# Patient Record
Sex: Male | Born: 1950
Health system: Southern US, Community
[De-identification: ages and names within clinical notes are randomized; demographics above are authoritative.]

## PROBLEM LIST (undated history)

## (undated) DIAGNOSIS — M1990 Unspecified osteoarthritis, unspecified site: Secondary | ICD-10-CM

## (undated) DIAGNOSIS — Z5189 Encounter for other specified aftercare: Secondary | ICD-10-CM

## (undated) DIAGNOSIS — R972 Elevated prostate specific antigen [PSA]: Secondary | ICD-10-CM

## (undated) DIAGNOSIS — N390 Urinary tract infection, site not specified: Secondary | ICD-10-CM

## (undated) DIAGNOSIS — K439 Ventral hernia without obstruction or gangrene: Secondary | ICD-10-CM

## (undated) DIAGNOSIS — Q670 Congenital facial asymmetry: Secondary | ICD-10-CM

## (undated) DIAGNOSIS — E213 Hyperparathyroidism, unspecified: Secondary | ICD-10-CM

## (undated) DIAGNOSIS — R140 Abdominal distension (gaseous): Secondary | ICD-10-CM

## (undated) DIAGNOSIS — Z9889 Other specified postprocedural states: Secondary | ICD-10-CM

## (undated) DIAGNOSIS — D510 Vitamin B12 deficiency anemia due to intrinsic factor deficiency: Secondary | ICD-10-CM

## (undated) DIAGNOSIS — F329 Major depressive disorder, single episode, unspecified: Secondary | ICD-10-CM

## (undated) DIAGNOSIS — N200 Calculus of kidney: Secondary | ICD-10-CM

## (undated) DIAGNOSIS — K5730 Diverticulosis of large intestine without perforation or abscess without bleeding: Secondary | ICD-10-CM

## (undated) DIAGNOSIS — I10 Essential (primary) hypertension: Secondary | ICD-10-CM

## (undated) DIAGNOSIS — F32A Depression, unspecified: Secondary | ICD-10-CM

## (undated) DIAGNOSIS — J189 Pneumonia, unspecified organism: Secondary | ICD-10-CM

## (undated) DIAGNOSIS — K219 Gastro-esophageal reflux disease without esophagitis: Secondary | ICD-10-CM

## (undated) DIAGNOSIS — K602 Anal fissure, unspecified: Secondary | ICD-10-CM

## (undated) DIAGNOSIS — G500 Trigeminal neuralgia: Secondary | ICD-10-CM

## (undated) DIAGNOSIS — K659 Peritonitis, unspecified: Secondary | ICD-10-CM

## (undated) DIAGNOSIS — K635 Polyp of colon: Secondary | ICD-10-CM

## (undated) DIAGNOSIS — R112 Nausea with vomiting, unspecified: Secondary | ICD-10-CM

## (undated) DIAGNOSIS — R066 Hiccough: Secondary | ICD-10-CM

## (undated) DIAGNOSIS — K589 Irritable bowel syndrome without diarrhea: Secondary | ICD-10-CM

## (undated) DIAGNOSIS — F419 Anxiety disorder, unspecified: Secondary | ICD-10-CM

## (undated) DIAGNOSIS — Z87898 Personal history of other specified conditions: Secondary | ICD-10-CM

## (undated) DIAGNOSIS — J449 Chronic obstructive pulmonary disease, unspecified: Secondary | ICD-10-CM

## (undated) DIAGNOSIS — E785 Hyperlipidemia, unspecified: Secondary | ICD-10-CM

## (undated) DIAGNOSIS — T83511A Infection and inflammatory reaction due to indwelling urethral catheter, initial encounter: Secondary | ICD-10-CM

## (undated) HISTORY — DX: Polyp of colon: K63.5

## (undated) HISTORY — DX: Peritonitis, unspecified: K65.9

## (undated) HISTORY — DX: Essential (primary) hypertension: I10

## (undated) HISTORY — DX: Encounter for other specified aftercare: Z51.89

## (undated) HISTORY — DX: Hyperlipidemia, unspecified: E78.5

## (undated) HISTORY — DX: Anal fissure, unspecified: K60.2

## (undated) HISTORY — DX: Elevated prostate specific antigen (PSA): R97.20

## (undated) HISTORY — DX: Depression, unspecified: F32.A

## (undated) HISTORY — DX: Gastro-esophageal reflux disease without esophagitis: K21.9

## (undated) HISTORY — DX: Personal history of other specified conditions: Z87.898

## (undated) HISTORY — DX: Pneumonia, unspecified organism: J18.9

## (undated) HISTORY — DX: Vitamin B12 deficiency anemia due to intrinsic factor deficiency: D51.0

## (undated) HISTORY — DX: Unspecified osteoarthritis, unspecified site: M19.90

## (undated) HISTORY — DX: Abdominal distension (gaseous): R14.0

## (undated) HISTORY — DX: Irritable bowel syndrome, unspecified: K58.9

## (undated) HISTORY — DX: Diverticulosis of large intestine without perforation or abscess without bleeding: K57.30

## (undated) HISTORY — DX: Chronic obstructive pulmonary disease, unspecified: J44.9

## (undated) HISTORY — DX: Anxiety disorder, unspecified: F41.9

## (undated) HISTORY — DX: Calculus of kidney: N20.0

## (undated) HISTORY — PX: ABDOMINAL SURGERY: SHX537

## (undated) HISTORY — DX: Hiccough: R06.6

## (undated) HISTORY — DX: Congenital facial asymmetry: Q67.0

## (undated) HISTORY — DX: Hyperparathyroidism, unspecified: E21.3

## (undated) HISTORY — DX: Trigeminal neuralgia: G50.0

## (undated) HISTORY — PX: UPPER GASTROINTESTINAL ENDOSCOPY: SHX188

## (undated) HISTORY — DX: Major depressive disorder, single episode, unspecified: F32.9

## (undated) HISTORY — PX: LITHOTRIPSY: SUR834

---

## 1971-11-07 HISTORY — PX: HAND SURGERY: SHX662

## 1979-11-07 HISTORY — PX: CHOLECYSTECTOMY: SHX55

## 1995-11-07 HISTORY — PX: KNEE SURGERY: SHX244

## 1997-11-06 DIAGNOSIS — Z87898 Personal history of other specified conditions: Secondary | ICD-10-CM

## 1997-11-06 HISTORY — PX: NISSEN FUNDOPLICATION: SHX2091

## 1997-11-06 HISTORY — DX: Personal history of other specified conditions: Z87.898

## 1997-11-06 HISTORY — PX: HERNIA REPAIR: SHX51

## 1998-03-25 ENCOUNTER — Observation Stay (HOSPITAL_COMMUNITY): Admission: EM | Admit: 1998-03-25 | Discharge: 1998-03-25 | Payer: Self-pay | Admitting: Emergency Medicine

## 1998-07-29 ENCOUNTER — Inpatient Hospital Stay (HOSPITAL_COMMUNITY): Admission: AD | Admit: 1998-07-29 | Discharge: 1998-09-28 | Payer: Self-pay | Admitting: Pulmonary Disease

## 1998-07-30 ENCOUNTER — Encounter: Payer: Self-pay | Admitting: Pulmonary Disease

## 1998-08-01 ENCOUNTER — Encounter: Payer: Self-pay | Admitting: Pulmonary Disease

## 1998-08-09 ENCOUNTER — Encounter: Payer: Self-pay | Admitting: Critical Care Medicine

## 1998-08-09 ENCOUNTER — Encounter: Payer: Self-pay | Admitting: Surgery

## 1998-08-11 ENCOUNTER — Encounter: Payer: Self-pay | Admitting: Pulmonary Disease

## 1998-08-16 ENCOUNTER — Encounter: Payer: Self-pay | Admitting: General Surgery

## 1998-08-17 ENCOUNTER — Encounter: Payer: Self-pay | Admitting: Pulmonary Disease

## 1998-08-18 ENCOUNTER — Encounter: Payer: Self-pay | Admitting: Internal Medicine

## 1998-08-20 ENCOUNTER — Encounter: Payer: Self-pay | Admitting: Pulmonary Disease

## 1998-08-23 ENCOUNTER — Encounter: Payer: Self-pay | Admitting: Pulmonary Disease

## 1998-08-24 ENCOUNTER — Encounter: Payer: Self-pay | Admitting: Internal Medicine

## 1998-08-24 ENCOUNTER — Encounter: Payer: Self-pay | Admitting: Critical Care Medicine

## 1998-08-25 ENCOUNTER — Encounter: Payer: Self-pay | Admitting: Pulmonary Disease

## 1998-08-26 ENCOUNTER — Encounter: Payer: Self-pay | Admitting: Critical Care Medicine

## 1998-08-27 ENCOUNTER — Encounter: Payer: Self-pay | Admitting: Surgery

## 1998-08-30 ENCOUNTER — Encounter: Payer: Self-pay | Admitting: Pulmonary Disease

## 1998-08-30 ENCOUNTER — Encounter: Payer: Self-pay | Admitting: Critical Care Medicine

## 1998-09-02 ENCOUNTER — Encounter: Payer: Self-pay | Admitting: Internal Medicine

## 1998-09-04 ENCOUNTER — Encounter: Payer: Self-pay | Admitting: Critical Care Medicine

## 1998-09-05 ENCOUNTER — Encounter: Payer: Self-pay | Admitting: Pulmonary Disease

## 1998-09-09 ENCOUNTER — Encounter: Payer: Self-pay | Admitting: Infectious Diseases

## 1998-09-15 ENCOUNTER — Encounter: Payer: Self-pay | Admitting: Critical Care Medicine

## 1998-09-15 ENCOUNTER — Encounter: Payer: Self-pay | Admitting: Pulmonary Disease

## 1998-09-17 ENCOUNTER — Encounter: Payer: Self-pay | Admitting: Pulmonary Disease

## 1998-09-17 ENCOUNTER — Encounter: Payer: Self-pay | Admitting: Critical Care Medicine

## 1998-09-19 ENCOUNTER — Encounter: Payer: Self-pay | Admitting: Pulmonary Disease

## 1998-09-21 ENCOUNTER — Encounter: Payer: Self-pay | Admitting: Pulmonary Disease

## 1998-09-28 ENCOUNTER — Inpatient Hospital Stay (HOSPITAL_COMMUNITY)
Admission: RE | Admit: 1998-09-28 | Discharge: 1998-10-06 | Payer: Self-pay | Admitting: Physical Medicine and Rehabilitation

## 1998-10-01 ENCOUNTER — Encounter: Payer: Self-pay | Admitting: Physical Medicine and Rehabilitation

## 1998-10-11 ENCOUNTER — Encounter
Admission: RE | Admit: 1998-10-11 | Discharge: 1999-01-09 | Payer: Self-pay | Admitting: Physical Medicine and Rehabilitation

## 1999-03-17 ENCOUNTER — Encounter: Payer: Self-pay | Admitting: Internal Medicine

## 1999-06-28 ENCOUNTER — Encounter: Payer: Self-pay | Admitting: Surgery

## 1999-07-01 ENCOUNTER — Inpatient Hospital Stay (HOSPITAL_COMMUNITY): Admission: AD | Admit: 1999-07-01 | Discharge: 1999-07-09 | Payer: Self-pay | Admitting: Surgery

## 1999-07-04 ENCOUNTER — Encounter: Payer: Self-pay | Admitting: Surgery

## 1999-07-06 ENCOUNTER — Encounter: Payer: Self-pay | Admitting: Surgery

## 1999-07-14 ENCOUNTER — Encounter: Payer: Self-pay | Admitting: Surgery

## 1999-07-14 ENCOUNTER — Inpatient Hospital Stay (HOSPITAL_COMMUNITY): Admission: EM | Admit: 1999-07-14 | Discharge: 1999-07-26 | Payer: Self-pay | Admitting: Emergency Medicine

## 1999-07-21 ENCOUNTER — Encounter: Payer: Self-pay | Admitting: Surgery

## 1999-08-11 ENCOUNTER — Inpatient Hospital Stay (HOSPITAL_COMMUNITY): Admission: RE | Admit: 1999-08-11 | Discharge: 1999-08-24 | Payer: Self-pay | Admitting: Surgery

## 1999-08-11 ENCOUNTER — Encounter: Payer: Self-pay | Admitting: Surgery

## 1999-08-12 ENCOUNTER — Encounter: Payer: Self-pay | Admitting: Surgery

## 1999-08-20 ENCOUNTER — Encounter: Payer: Self-pay | Admitting: Surgery

## 1999-09-01 ENCOUNTER — Encounter: Payer: Self-pay | Admitting: General Surgery

## 1999-09-01 ENCOUNTER — Ambulatory Visit (HOSPITAL_COMMUNITY): Admission: RE | Admit: 1999-09-01 | Discharge: 1999-09-01 | Payer: Self-pay | Admitting: General Surgery

## 1999-10-13 ENCOUNTER — Encounter: Payer: Self-pay | Admitting: Surgery

## 1999-10-13 ENCOUNTER — Ambulatory Visit (HOSPITAL_COMMUNITY): Admission: RE | Admit: 1999-10-13 | Discharge: 1999-10-13 | Payer: Self-pay | Admitting: Surgery

## 2000-06-11 ENCOUNTER — Encounter: Payer: Self-pay | Admitting: Internal Medicine

## 2000-06-11 ENCOUNTER — Inpatient Hospital Stay: Admission: RE | Admit: 2000-06-11 | Discharge: 2000-06-13 | Payer: Self-pay | Admitting: Internal Medicine

## 2000-06-11 ENCOUNTER — Encounter (INDEPENDENT_AMBULATORY_CARE_PROVIDER_SITE_OTHER): Payer: Self-pay | Admitting: *Deleted

## 2000-06-12 ENCOUNTER — Encounter: Payer: Self-pay | Admitting: Emergency Medicine

## 2000-07-27 ENCOUNTER — Encounter: Payer: Self-pay | Admitting: Surgery

## 2000-07-30 ENCOUNTER — Inpatient Hospital Stay (HOSPITAL_COMMUNITY): Admission: RE | Admit: 2000-07-30 | Discharge: 2000-08-04 | Payer: Self-pay | Admitting: Surgery

## 2000-07-30 ENCOUNTER — Encounter (INDEPENDENT_AMBULATORY_CARE_PROVIDER_SITE_OTHER): Payer: Self-pay | Admitting: *Deleted

## 2001-06-05 ENCOUNTER — Ambulatory Visit: Admission: RE | Admit: 2001-06-05 | Discharge: 2001-06-05 | Payer: Self-pay | Admitting: Pulmonary Disease

## 2002-04-15 ENCOUNTER — Encounter: Payer: Self-pay | Admitting: Surgery

## 2002-04-15 ENCOUNTER — Ambulatory Visit (HOSPITAL_COMMUNITY): Admission: RE | Admit: 2002-04-15 | Discharge: 2002-04-15 | Payer: Self-pay | Admitting: Surgery

## 2002-10-17 ENCOUNTER — Encounter: Payer: Self-pay | Admitting: Internal Medicine

## 2002-10-17 ENCOUNTER — Ambulatory Visit (HOSPITAL_COMMUNITY): Admission: RE | Admit: 2002-10-17 | Discharge: 2002-10-17 | Payer: Self-pay | Admitting: Internal Medicine

## 2002-11-06 HISTORY — PX: VENTRAL HERNIA REPAIR: SHX424

## 2003-05-29 ENCOUNTER — Encounter: Admission: RE | Admit: 2003-05-29 | Discharge: 2003-05-29 | Payer: Self-pay | Admitting: Surgery

## 2003-05-29 ENCOUNTER — Encounter: Payer: Self-pay | Admitting: Surgery

## 2003-06-03 ENCOUNTER — Encounter: Payer: Self-pay | Admitting: Surgery

## 2003-06-03 ENCOUNTER — Ambulatory Visit (HOSPITAL_COMMUNITY): Admission: RE | Admit: 2003-06-03 | Discharge: 2003-06-03 | Payer: Self-pay | Admitting: Surgery

## 2003-06-22 ENCOUNTER — Encounter: Payer: Self-pay | Admitting: Surgery

## 2003-06-22 ENCOUNTER — Ambulatory Visit (HOSPITAL_COMMUNITY): Admission: RE | Admit: 2003-06-22 | Discharge: 2003-06-22 | Payer: Self-pay | Admitting: Surgery

## 2003-11-09 ENCOUNTER — Ambulatory Visit (HOSPITAL_COMMUNITY): Admission: RE | Admit: 2003-11-09 | Discharge: 2003-11-09 | Payer: Self-pay | Admitting: Urology

## 2004-09-15 ENCOUNTER — Ambulatory Visit: Payer: Self-pay | Admitting: Endocrinology

## 2004-09-26 ENCOUNTER — Ambulatory Visit: Payer: Self-pay | Admitting: Internal Medicine

## 2004-09-26 ENCOUNTER — Ambulatory Visit: Payer: Self-pay | Admitting: Gastroenterology

## 2004-09-26 ENCOUNTER — Inpatient Hospital Stay (HOSPITAL_COMMUNITY): Admission: AD | Admit: 2004-09-26 | Discharge: 2004-09-28 | Payer: Self-pay | Admitting: Internal Medicine

## 2004-09-28 ENCOUNTER — Encounter: Payer: Self-pay | Admitting: Gastroenterology

## 2004-09-28 ENCOUNTER — Encounter (INDEPENDENT_AMBULATORY_CARE_PROVIDER_SITE_OTHER): Payer: Self-pay | Admitting: *Deleted

## 2004-09-30 ENCOUNTER — Ambulatory Visit: Payer: Self-pay | Admitting: Endocrinology

## 2005-01-26 ENCOUNTER — Ambulatory Visit: Payer: Self-pay | Admitting: Internal Medicine

## 2005-03-14 ENCOUNTER — Ambulatory Visit: Payer: Self-pay | Admitting: Endocrinology

## 2005-05-03 ENCOUNTER — Ambulatory Visit: Payer: Self-pay | Admitting: Endocrinology

## 2005-05-10 ENCOUNTER — Ambulatory Visit: Payer: Self-pay | Admitting: Endocrinology

## 2005-05-10 ENCOUNTER — Ambulatory Visit (HOSPITAL_COMMUNITY): Admission: RE | Admit: 2005-05-10 | Discharge: 2005-05-10 | Payer: Self-pay | Admitting: Endocrinology

## 2005-05-10 ENCOUNTER — Ambulatory Visit: Payer: Self-pay

## 2005-05-22 ENCOUNTER — Ambulatory Visit: Payer: Self-pay | Admitting: Endocrinology

## 2005-06-07 ENCOUNTER — Ambulatory Visit: Payer: Self-pay | Admitting: Endocrinology

## 2005-07-28 ENCOUNTER — Ambulatory Visit: Payer: Self-pay | Admitting: Endocrinology

## 2005-09-06 ENCOUNTER — Ambulatory Visit: Payer: Self-pay | Admitting: Endocrinology

## 2006-03-07 ENCOUNTER — Encounter: Payer: Self-pay | Admitting: Surgery

## 2006-07-12 ENCOUNTER — Ambulatory Visit: Payer: Self-pay | Admitting: Endocrinology

## 2006-07-25 ENCOUNTER — Ambulatory Visit (HOSPITAL_COMMUNITY): Admission: RE | Admit: 2006-07-25 | Discharge: 2006-07-25 | Payer: Self-pay | Admitting: Urology

## 2006-07-26 ENCOUNTER — Ambulatory Visit: Payer: Self-pay | Admitting: Gastroenterology

## 2006-07-31 ENCOUNTER — Ambulatory Visit: Payer: Self-pay | Admitting: Endocrinology

## 2006-08-10 ENCOUNTER — Ambulatory Visit: Payer: Self-pay | Admitting: Endocrinology

## 2006-09-03 ENCOUNTER — Ambulatory Visit: Payer: Self-pay | Admitting: Gastroenterology

## 2006-09-03 ENCOUNTER — Encounter (INDEPENDENT_AMBULATORY_CARE_PROVIDER_SITE_OTHER): Payer: Self-pay | Admitting: *Deleted

## 2006-09-03 ENCOUNTER — Encounter: Payer: Self-pay | Admitting: Endocrinology

## 2006-10-18 ENCOUNTER — Ambulatory Visit: Payer: Self-pay | Admitting: Internal Medicine

## 2006-10-25 ENCOUNTER — Ambulatory Visit: Payer: Self-pay | Admitting: Cardiology

## 2006-10-25 ENCOUNTER — Ambulatory Visit: Payer: Self-pay | Admitting: Internal Medicine

## 2006-10-25 LAB — CONVERTED CEMR LAB
ALT: 20 units/L (ref 0–40)
AST: 23 units/L (ref 0–37)
Albumin: 3.7 g/dL (ref 3.5–5.2)
Alkaline Phosphatase: 68 units/L (ref 39–117)
Amylase: 44 units/L (ref 27–131)
BUN: 17 mg/dL (ref 6–23)
Bacteria, U Microscopic: NEGATIVE /hpf
Basophils Absolute: 0.2 10*3/uL — ABNORMAL HIGH (ref 0.0–0.1)
Basophils Relative: 1.5 % — ABNORMAL HIGH (ref 0.0–1.0)
Bilirubin Urine: NEGATIVE
CO2: 26 meq/L (ref 19–32)
Calcium: 8.7 mg/dL (ref 8.4–10.5)
Chloride: 101 meq/L (ref 96–112)
Creatinine, Ser: 1.1 mg/dL (ref 0.4–1.5)
Eosinophil percent: 0 % (ref 0.0–5.0)
GFR calc non Af Amer: 74 mL/min
Glomerular Filtration Rate, Af Am: 90 mL/min/{1.73_m2}
Glucose, Bld: 118 mg/dL — ABNORMAL HIGH (ref 70–99)
HCT: 52.8 % — ABNORMAL HIGH (ref 39.0–52.0)
Hemoglobin, Urine: NEGATIVE
Hemoglobin: 17.6 g/dL — ABNORMAL HIGH (ref 13.0–17.0)
Ketones, ur: NEGATIVE mg/dL
Leukocytes, UA: NEGATIVE
Lipase: 19 units/L (ref 11.0–59.0)
Lymphocytes Relative: 5 % — ABNORMAL LOW (ref 12.0–46.0)
MCHC: 33.4 g/dL (ref 30.0–36.0)
MCV: 89.9 fL (ref 78.0–100.0)
Monocytes Absolute: 0.4 10*3/uL (ref 0.2–0.7)
Monocytes Relative: 3.7 % (ref 3.0–11.0)
Neutro Abs: 10 10*3/uL — ABNORMAL HIGH (ref 1.4–7.7)
Neutrophils Relative %: 89.8 % — ABNORMAL HIGH (ref 43.0–77.0)
Nitrite: NEGATIVE
Platelets: 151 10*3/uL (ref 150–400)
Potassium: 4 meq/L (ref 3.5–5.1)
RBC / HPF: NONE SEEN
RBC: 5.88 M/uL — ABNORMAL HIGH (ref 4.22–5.81)
RDW: 12.6 % (ref 11.5–14.6)
Sodium: 138 meq/L (ref 135–145)
Specific Gravity, Urine: 1.025 (ref 1.000–1.03)
Total Bilirubin: 1.1 mg/dL (ref 0.3–1.2)
Total Protein, Urine: NEGATIVE mg/dL
Total Protein: 7 g/dL (ref 6.0–8.3)
Urine Glucose: NEGATIVE mg/dL
Urobilinogen, UA: 1 (ref 0.0–1.0)
WBC: 11.2 10*3/uL — ABNORMAL HIGH (ref 4.5–10.5)
pH: 6 (ref 5.0–8.0)

## 2006-11-09 ENCOUNTER — Encounter (INDEPENDENT_AMBULATORY_CARE_PROVIDER_SITE_OTHER): Payer: Self-pay | Admitting: *Deleted

## 2006-11-09 ENCOUNTER — Encounter: Admission: RE | Admit: 2006-11-09 | Discharge: 2006-11-09 | Payer: Self-pay | Admitting: Surgery

## 2007-02-07 ENCOUNTER — Ambulatory Visit: Payer: Self-pay | Admitting: Endocrinology

## 2007-05-08 ENCOUNTER — Encounter: Payer: Self-pay | Admitting: Endocrinology

## 2007-05-08 DIAGNOSIS — I10 Essential (primary) hypertension: Secondary | ICD-10-CM | POA: Insufficient documentation

## 2007-05-08 DIAGNOSIS — K219 Gastro-esophageal reflux disease without esophagitis: Secondary | ICD-10-CM | POA: Insufficient documentation

## 2007-05-28 ENCOUNTER — Ambulatory Visit: Payer: Self-pay | Admitting: Endocrinology

## 2007-08-24 ENCOUNTER — Ambulatory Visit: Payer: Self-pay | Admitting: Family Medicine

## 2007-09-18 ENCOUNTER — Ambulatory Visit: Payer: Self-pay | Admitting: Endocrinology

## 2007-09-18 LAB — CONVERTED CEMR LAB
ALT: 31 units/L (ref 0–53)
AST: 29 units/L (ref 0–37)
Albumin: 3.8 g/dL (ref 3.5–5.2)
Alkaline Phosphatase: 55 units/L (ref 39–117)
BUN: 13 mg/dL (ref 6–23)
Bacteria, UA: NEGATIVE
Basophils Absolute: 0.1 10*3/uL (ref 0.0–0.1)
Basophils Relative: 1 % (ref 0.0–1.0)
Bilirubin Urine: NEGATIVE
Bilirubin, Direct: 0.2 mg/dL (ref 0.0–0.3)
CO2: 29 meq/L (ref 19–32)
Calcium: 9.2 mg/dL (ref 8.4–10.5)
Chloride: 105 meq/L (ref 96–112)
Cholesterol: 172 mg/dL (ref 0–200)
Creatinine, Ser: 0.8 mg/dL (ref 0.4–1.5)
Crystals: NEGATIVE
Eosinophils Absolute: 0.1 10*3/uL (ref 0.0–0.6)
Eosinophils Relative: 1.6 % (ref 0.0–5.0)
GFR calc Af Amer: 129 mL/min
GFR calc non Af Amer: 107 mL/min
Glucose, Bld: 94 mg/dL (ref 70–99)
HCT: 45.3 % (ref 39.0–52.0)
HDL: 46.3 mg/dL (ref >39.0)
Hemoglobin, Urine: NEGATIVE
Hemoglobin: 15.8 g/dL (ref 13.0–17.0)
Ketones, ur: NEGATIVE mg/dL
LDL Cholesterol: 105 mg/dL — ABNORMAL HIGH (ref 0–99)
Leukocytes, UA: NEGATIVE
Lymphocytes Relative: 41.9 % (ref 12.0–46.0)
MCHC: 34.9 g/dL (ref 30.0–36.0)
MCV: 90.5 fL (ref 78.0–100.0)
Monocytes Absolute: 0.5 10*3/uL (ref 0.2–0.7)
Monocytes Relative: 7.7 % (ref 3.0–11.0)
Mucus, UA: NEGATIVE
Neutro Abs: 3 10*3/uL (ref 1.4–7.7)
Neutrophils Relative %: 47.8 % (ref 43.0–77.0)
Nitrite: NEGATIVE
PSA: 5.72 ng/mL — ABNORMAL HIGH (ref 0.10–4.00)
Platelets: 130 10*3/uL — ABNORMAL LOW (ref 150–400)
Potassium: 4.7 meq/L (ref 3.5–5.1)
RBC / HPF: NONE SEEN
RBC: 5.01 M/uL (ref 4.22–5.81)
RDW: 12.3 % (ref 11.5–14.6)
Sodium: 141 meq/L (ref 135–145)
Specific Gravity, Urine: 1.02 (ref 1.000–1.03)
TSH: 1.43 microintl units/mL (ref 0.35–5.50)
Total Bilirubin: 1.1 mg/dL (ref 0.3–1.2)
Total CHOL/HDL Ratio: 3.7
Total Protein, Urine: NEGATIVE mg/dL
Total Protein: 6.9 g/dL (ref 6.0–8.3)
Triglycerides: 103 mg/dL (ref 0–149)
Urine Glucose: NEGATIVE mg/dL
Urobilinogen, UA: 1 (ref 0.0–1.0)
VLDL: 21 mg/dL (ref 0–40)
WBC: 6.4 10*3/uL (ref 4.5–10.5)
pH: 6.5 (ref 5.0–8.0)

## 2007-11-14 ENCOUNTER — Ambulatory Visit: Payer: Self-pay | Admitting: Internal Medicine

## 2007-11-14 ENCOUNTER — Telehealth (INDEPENDENT_AMBULATORY_CARE_PROVIDER_SITE_OTHER): Payer: Self-pay | Admitting: *Deleted

## 2007-11-14 LAB — CONVERTED CEMR LAB: Rapid Strep: NEGATIVE

## 2007-11-15 ENCOUNTER — Encounter: Payer: Self-pay | Admitting: Endocrinology

## 2008-04-02 ENCOUNTER — Ambulatory Visit: Payer: Self-pay | Admitting: Internal Medicine

## 2008-04-02 DIAGNOSIS — R5381 Other malaise: Secondary | ICD-10-CM | POA: Insufficient documentation

## 2008-04-02 DIAGNOSIS — R5383 Other fatigue: Secondary | ICD-10-CM

## 2008-04-02 LAB — CONVERTED CEMR LAB
ALT: 21 units/L (ref 0–53)
AST: 22 units/L (ref 0–37)
Albumin: 3.8 g/dL (ref 3.5–5.2)
Alkaline Phosphatase: 61 units/L (ref 39–117)
BUN: 13 mg/dL (ref 6–23)
Basophils Absolute: 0.1 10*3/uL (ref 0.0–0.1)
Basophils Relative: 1.1 % — ABNORMAL HIGH (ref 0.0–1.0)
Bilirubin, Direct: 0.1 mg/dL (ref 0.0–0.3)
CO2: 27 meq/L (ref 19–32)
Calcium: 9.2 mg/dL (ref 8.4–10.5)
Chloride: 103 meq/L (ref 96–112)
Creatinine, Ser: 0.9 mg/dL (ref 0.4–1.5)
Eosinophils Absolute: 0.1 10*3/uL (ref 0.0–0.7)
Eosinophils Relative: 2.1 % (ref 0.0–5.0)
GFR calc Af Amer: 113 mL/min
GFR calc non Af Amer: 93 mL/min
Glucose, Bld: 94 mg/dL (ref 70–99)
HCT: 41.2 % (ref 39.0–52.0)
Hemoglobin: 13.6 g/dL (ref 13.0–17.0)
Lymphocytes Relative: 40.1 % (ref 12.0–46.0)
MCHC: 33 g/dL (ref 30.0–36.0)
MCV: 81.6 fL (ref 78.0–100.0)
Monocytes Absolute: 0.5 10*3/uL (ref 0.1–1.0)
Monocytes Relative: 8.1 % (ref 3.0–12.0)
Neutro Abs: 3 10*3/uL (ref 1.4–7.7)
Neutrophils Relative %: 48.6 % (ref 43.0–77.0)
Platelets: 158 10*3/uL (ref 150–400)
Potassium: 4.6 meq/L (ref 3.5–5.1)
RBC: 5.05 M/uL (ref 4.22–5.81)
RDW: 14.1 % (ref 11.5–14.6)
Rheumatoid fact SerPl-aCnc: <20 intl units/mL — ABNORMAL LOW (ref 0.0–20.0)
Sed Rate: 10 mm/hr (ref 0–16)
Sodium: 140 meq/L (ref 135–145)
TSH: 1.3 microintl units/mL (ref 0.35–5.50)
Total Bilirubin: 0.8 mg/dL (ref 0.3–1.2)
Total Protein: 7 g/dL (ref 6.0–8.3)
WBC: 6.2 10*3/uL (ref 4.5–10.5)

## 2008-04-03 DIAGNOSIS — J309 Allergic rhinitis, unspecified: Secondary | ICD-10-CM | POA: Insufficient documentation

## 2008-04-03 LAB — CONVERTED CEMR LAB: Anti Nuclear Antibody(ANA): NEGATIVE

## 2008-05-05 ENCOUNTER — Ambulatory Visit: Payer: Self-pay | Admitting: Endocrinology

## 2008-05-05 ENCOUNTER — Encounter (INDEPENDENT_AMBULATORY_CARE_PROVIDER_SITE_OTHER): Payer: Self-pay | Admitting: *Deleted

## 2008-05-05 DIAGNOSIS — R079 Chest pain, unspecified: Secondary | ICD-10-CM | POA: Insufficient documentation

## 2008-06-01 ENCOUNTER — Telehealth: Payer: Self-pay | Admitting: Endocrinology

## 2008-06-02 ENCOUNTER — Encounter: Payer: Self-pay | Admitting: Endocrinology

## 2008-06-30 ENCOUNTER — Encounter: Payer: Self-pay | Admitting: Endocrinology

## 2008-08-12 ENCOUNTER — Encounter: Payer: Self-pay | Admitting: Endocrinology

## 2008-09-15 ENCOUNTER — Telehealth (INDEPENDENT_AMBULATORY_CARE_PROVIDER_SITE_OTHER): Payer: Self-pay | Admitting: *Deleted

## 2008-09-30 ENCOUNTER — Encounter: Payer: Self-pay | Admitting: Internal Medicine

## 2008-10-22 ENCOUNTER — Telehealth: Payer: Self-pay | Admitting: Endocrinology

## 2008-11-18 ENCOUNTER — Encounter: Payer: Self-pay | Admitting: Internal Medicine

## 2009-01-28 ENCOUNTER — Encounter: Payer: Self-pay | Admitting: Endocrinology

## 2009-03-04 ENCOUNTER — Ambulatory Visit: Payer: Self-pay | Admitting: Endocrinology

## 2009-03-04 DIAGNOSIS — J449 Chronic obstructive pulmonary disease, unspecified: Secondary | ICD-10-CM

## 2009-03-04 DIAGNOSIS — J4489 Other specified chronic obstructive pulmonary disease: Secondary | ICD-10-CM

## 2009-03-04 HISTORY — DX: Other specified chronic obstructive pulmonary disease: J44.89

## 2009-03-04 HISTORY — DX: Chronic obstructive pulmonary disease, unspecified: J44.9

## 2009-03-04 LAB — CONVERTED CEMR LAB
Calcium, Total (PTH): 9.4 mg/dL (ref 8.4–10.5)
PTH: 65.8 pg/mL (ref 14.0–72.0)

## 2009-03-14 LAB — CONVERTED CEMR LAB
ALT: 16 units/L (ref 0–53)
AST: 19 units/L (ref 0–37)
Albumin: 3.8 g/dL (ref 3.5–5.2)
Alkaline Phosphatase: 63 units/L (ref 39–117)
BUN: 13 mg/dL (ref 6–23)
Basophils Absolute: 0 10*3/uL (ref 0.0–0.1)
Basophils Relative: 0.5 % (ref 0.0–3.0)
Bilirubin Urine: NEGATIVE
Bilirubin, Direct: 0.1 mg/dL (ref 0.0–0.3)
CO2: 31 meq/L (ref 19–32)
Calcium: 9.2 mg/dL (ref 8.4–10.5)
Chloride: 109 meq/L (ref 96–112)
Cholesterol: 153 mg/dL (ref 0–200)
Creatinine, Ser: 0.9 mg/dL (ref 0.4–1.5)
Eosinophils Absolute: 0.1 10*3/uL (ref 0.0–0.7)
Eosinophils Relative: 1.9 % (ref 0.0–5.0)
GFR calc non Af Amer: 92.54 mL/min (ref >60)
Glucose, Bld: 93 mg/dL (ref 70–99)
HCT: 42.8 % (ref 39.0–52.0)
HDL: 38.8 mg/dL — ABNORMAL LOW (ref >39.00)
Hemoglobin, Urine: NEGATIVE
Hemoglobin: 14.6 g/dL (ref 13.0–17.0)
Ketones, ur: NEGATIVE mg/dL
LDL Cholesterol: 98 mg/dL (ref 0–99)
Leukocytes, UA: NEGATIVE
Lymphocytes Relative: 39.9 % (ref 12.0–46.0)
Lymphs Abs: 2.4 10*3/uL (ref 0.7–4.0)
MCHC: 34.2 g/dL (ref 30.0–36.0)
MCV: 85.7 fL (ref 78.0–100.0)
Monocytes Absolute: 0.4 10*3/uL (ref 0.1–1.0)
Monocytes Relative: 6.2 % (ref 3.0–12.0)
Neutro Abs: 3 10*3/uL (ref 1.4–7.7)
Neutrophils Relative %: 51.5 % (ref 43.0–77.0)
Nitrite: NEGATIVE
PSA: 5.46 ng/mL — ABNORMAL HIGH (ref 0.10–4.00)
Platelets: 171 10*3/uL (ref 150.0–400.0)
Potassium: 4.8 meq/L (ref 3.5–5.1)
RBC: 4.99 M/uL (ref 4.22–5.81)
RDW: 13.1 % (ref 11.5–14.6)
Sodium: 143 meq/L (ref 135–145)
Specific Gravity, Urine: 1.015 (ref 1.000–1.030)
TSH: 0.87 microintl units/mL (ref 0.35–5.50)
Total Bilirubin: 0.8 mg/dL (ref 0.3–1.2)
Total CHOL/HDL Ratio: 4
Total Protein, Urine: NEGATIVE mg/dL
Total Protein: 7.1 g/dL (ref 6.0–8.3)
Triglycerides: 82 mg/dL (ref 0.0–149.0)
Urine Glucose: NEGATIVE mg/dL
Urobilinogen, UA: 1 (ref 0.0–1.0)
VLDL: 16.4 mg/dL (ref 0.0–40.0)
WBC: 5.9 10*3/uL (ref 4.5–10.5)
pH: 7 (ref 5.0–8.0)

## 2009-03-17 ENCOUNTER — Telehealth (INDEPENDENT_AMBULATORY_CARE_PROVIDER_SITE_OTHER): Payer: Self-pay | Admitting: *Deleted

## 2009-03-18 ENCOUNTER — Ambulatory Visit: Payer: Self-pay

## 2009-03-22 ENCOUNTER — Encounter: Payer: Self-pay | Admitting: Endocrinology

## 2009-03-24 ENCOUNTER — Encounter: Payer: Self-pay | Admitting: Endocrinology

## 2009-10-20 ENCOUNTER — Encounter: Payer: Self-pay | Admitting: Endocrinology

## 2009-11-03 ENCOUNTER — Encounter: Payer: Self-pay | Admitting: Endocrinology

## 2009-11-09 ENCOUNTER — Encounter: Payer: Self-pay | Admitting: Endocrinology

## 2009-12-06 ENCOUNTER — Telehealth: Payer: Self-pay | Admitting: Gastroenterology

## 2009-12-08 ENCOUNTER — Encounter (INDEPENDENT_AMBULATORY_CARE_PROVIDER_SITE_OTHER): Payer: Self-pay | Admitting: *Deleted

## 2009-12-10 ENCOUNTER — Telehealth: Payer: Self-pay | Admitting: Internal Medicine

## 2009-12-13 DIAGNOSIS — K659 Peritonitis, unspecified: Secondary | ICD-10-CM | POA: Insufficient documentation

## 2009-12-13 DIAGNOSIS — Z860100 Personal history of colon polyps, unspecified: Secondary | ICD-10-CM | POA: Insufficient documentation

## 2009-12-15 ENCOUNTER — Ambulatory Visit: Payer: Self-pay | Admitting: Internal Medicine

## 2009-12-21 ENCOUNTER — Ambulatory Visit: Payer: Self-pay | Admitting: Internal Medicine

## 2009-12-22 ENCOUNTER — Telehealth: Payer: Self-pay | Admitting: Internal Medicine

## 2009-12-22 ENCOUNTER — Encounter: Payer: Self-pay | Admitting: Internal Medicine

## 2009-12-24 ENCOUNTER — Encounter: Payer: Self-pay | Admitting: Internal Medicine

## 2009-12-28 ENCOUNTER — Ambulatory Visit (HOSPITAL_COMMUNITY): Admission: RE | Admit: 2009-12-28 | Discharge: 2009-12-28 | Payer: Self-pay | Admitting: Internal Medicine

## 2010-01-21 ENCOUNTER — Ambulatory Visit: Payer: Self-pay | Admitting: Internal Medicine

## 2010-04-21 ENCOUNTER — Encounter (INDEPENDENT_AMBULATORY_CARE_PROVIDER_SITE_OTHER): Payer: Self-pay | Admitting: *Deleted

## 2010-05-10 ENCOUNTER — Encounter: Payer: Self-pay | Admitting: Endocrinology

## 2010-06-15 ENCOUNTER — Ambulatory Visit: Payer: Self-pay | Admitting: Internal Medicine

## 2010-06-21 ENCOUNTER — Telehealth: Payer: Self-pay | Admitting: Endocrinology

## 2010-09-02 ENCOUNTER — Encounter: Payer: Self-pay | Admitting: Endocrinology

## 2010-09-02 ENCOUNTER — Ambulatory Visit: Payer: Self-pay | Admitting: Endocrinology

## 2010-09-02 DIAGNOSIS — L719 Rosacea, unspecified: Secondary | ICD-10-CM | POA: Insufficient documentation

## 2010-09-02 LAB — CONVERTED CEMR LAB
ALT: 15 units/L (ref 0–53)
AST: 20 units/L (ref 0–37)
Albumin: 3.8 g/dL (ref 3.5–5.2)
Alkaline Phosphatase: 70 units/L (ref 39–117)
BUN: 16 mg/dL (ref 6–23)
Basophils Absolute: 0.1 10*3/uL (ref 0.0–0.1)
Basophils Relative: 0.8 % (ref 0.0–3.0)
Bilirubin Urine: NEGATIVE
Bilirubin, Direct: 0.1 mg/dL (ref 0.0–0.3)
CO2: 29 meq/L (ref 19–32)
Calcium, Total (PTH): 9.4 mg/dL (ref 8.4–10.5)
Calcium: 9.3 mg/dL (ref 8.4–10.5)
Chloride: 103 meq/L (ref 96–112)
Cholesterol: 177 mg/dL (ref 0–200)
Creatinine, Ser: 0.9 mg/dL (ref 0.4–1.5)
Eosinophils Absolute: 0.1 10*3/uL (ref 0.0–0.7)
Eosinophils Relative: 1.4 % (ref 0.0–5.0)
GFR calc non Af Amer: 87.54 mL/min (ref >60)
Glucose, Bld: 86 mg/dL (ref 70–99)
HCT: 44.7 % (ref 39.0–52.0)
HDL: 39.9 mg/dL (ref >39.00)
Hemoglobin, Urine: NEGATIVE
Hemoglobin: 15.4 g/dL (ref 13.0–17.0)
Ketones, ur: NEGATIVE mg/dL
LDL Cholesterol: 114 mg/dL — ABNORMAL HIGH (ref 0–99)
Leukocytes, UA: NEGATIVE
Lymphocytes Relative: 41.3 % (ref 12.0–46.0)
Lymphs Abs: 2.7 10*3/uL (ref 0.7–4.0)
MCHC: 34.5 g/dL (ref 30.0–36.0)
MCV: 87.5 fL (ref 78.0–100.0)
Monocytes Absolute: 0.5 10*3/uL (ref 0.1–1.0)
Monocytes Relative: 7.1 % (ref 3.0–12.0)
Neutro Abs: 3.3 10*3/uL (ref 1.4–7.7)
Neutrophils Relative %: 49.4 % (ref 43.0–77.0)
Nitrite: NEGATIVE
PSA: 5.69 ng/mL — ABNORMAL HIGH (ref 0.10–4.00)
PTH: 59.8 pg/mL (ref 14.0–72.0)
Platelets: 158 10*3/uL (ref 150.0–400.0)
Potassium: 4.6 meq/L (ref 3.5–5.1)
RBC: 5.11 M/uL (ref 4.22–5.81)
RDW: 13.5 % (ref 11.5–14.6)
Sodium: 140 meq/L (ref 135–145)
Specific Gravity, Urine: 1.02 (ref 1.000–1.030)
TSH: 1.43 microintl units/mL (ref 0.35–5.50)
Total Bilirubin: 0.9 mg/dL (ref 0.3–1.2)
Total CHOL/HDL Ratio: 4
Total Protein, Urine: NEGATIVE mg/dL
Total Protein: 7.2 g/dL (ref 6.0–8.3)
Triglycerides: 118 mg/dL (ref 0.0–149.0)
Urine Glucose: NEGATIVE mg/dL
Urobilinogen, UA: 1 (ref 0.0–1.0)
VLDL: 23.6 mg/dL (ref 0.0–40.0)
WBC: 6.6 10*3/uL (ref 4.5–10.5)
pH: 6.5 (ref 5.0–8.0)

## 2010-10-03 ENCOUNTER — Encounter: Payer: Self-pay | Admitting: Endocrinology

## 2010-11-15 ENCOUNTER — Encounter: Payer: Self-pay | Admitting: Endocrinology

## 2010-11-27 ENCOUNTER — Encounter: Payer: Self-pay | Admitting: Rheumatology

## 2010-11-28 ENCOUNTER — Encounter: Payer: Self-pay | Admitting: Endocrinology

## 2010-12-06 NOTE — Discharge Summary (Signed)
Summary: Abdominal Pain, Ventral Hernia, Nausea, GERD   NAME:  [name], [name]          ACCOUNT NO.:  [id number]   MEDICAL RECORD NO.:  [id number]          PATIENT TYPE:  INP   LOCATION:  5017                         FACILITY:  [facility]   PHYSICIAN:  Rene Paci, M.D. LHCDATE OF BIRTH:  14-Oct-1951   DATE OF ADMISSION:  09/26/2004  DATE OF DISCHARGE:  09/28/2004                                 DISCHARGE SUMMARY   DISCHARGE DIAGNOSES:  1.  Abdominal pain.  2.  Nausea.  3.  Chronic gastroesophageal reflux disease.  4.  Ventral hernia.   BRIEF ADMISSION HISTORY:  [name] is a 60 year old white male who  developed abdominal pain on the night prior to admission.  This was  associated with bloating.  The patient's pain was sharp and radiated to his  back.  He did have some associated shortness of breath.  He woke up at 2:00  a.m. with persistent symptoms.  He stated he had some chills, but no fever.  He denied any constipation or diarrhea.   PAST MEDICAL HISTORY:  1.  Gastroesophageal reflux disease.  2.  Status post Nissen fundoplication in 1999.  3.  History of peritonitis in 1999 secondary to surgery.  4.  Hypertension.  5.  History of ventral hernia repair with mesh times four to five within the      past five years.  6.  Dyslipidemia.  7.  History of urolithiasis.  8.  Status post cholecystectomy.   HOSPITAL COURSE:  PROBLEM 1.  GI - The patient presented with abdominal  pain.  Patient had a CT of his abdomen and pelvis.  This revealed  postoperative changes with a large interior upper abdominal wall hernia just  inferior to the xyphoid with a 13 cm diameter protrusion of stomach and  small bowel.  The patient was seen in consultation by surgery.  They did not  feel that this was the cause of his abdominal pain.  Therefore, we did ask  for a GI evaluation.  The patient was seen in consultation by Dr. Russella Dar and  the patient underwent an endoscopy on September 28, 2004.  There was no cause  for symptoms noted on his EGD.  They did recommend continuing proton pump  inhibitor for chronic gastroesophageal reflux disease.  GI suspected that  his symptoms were possibly related to his ventral hernia; although, as  noted, surgery did not feel this was true.  It was therefore felt the  patient should follow-up with his surgeon at [facility] for  further evaluation.   PROBLEM 2.  Elevated D-Dimer - CT of the chest was negative for PE.  Lower  extremity venous Dopplers were negative for DVT.  We suspect this was  elevated in response to his acute primary problem.   PROBLEM 3.  ID - The patient was empirically started on Flagyl and Ancef.  However, there was no etiology for infection so his antibiotics were  discontinued.   MEDICATIONS AT DISCHARGE:  He was instructed to resume his home medications  and follow-up with Dr. Everardo All on Friday at 10:00 a.m.  Laur   LC/MEDQ  D:  11/08/2004  T:  11/08/2004  Job:  [id number]   cc:   Gregary Signs A. Everardo All, M.D. [facility]   Malcolm T. Russella Dar, M.D. [facility]

## 2010-12-06 NOTE — Letter (Signed)
[facility]: [facility]   Imported By: Sherian Rein 05/16/2010 13:54:23  _____________________________________________________________________  External Attachment:    Type:   Image     Comment:   External Document

## 2010-12-06 NOTE — Assessment & Plan Note (Signed)
Summary: HERNIA/YF                    (MD SWITCH APPROVED)    History of Present Illness Visit Type: consult  Primary GI MD: [name] MD Primary Provider: Romero Belling, MD  Requesting Provider: Romero Belling, MD  Chief Complaint: Epigastric pain, chest pain, acid reflux, burning in throat, and bloating  History of Present Illness:   This is a 60 year old white male with burning substernal chest pain. Cardiac causes have been ruled out. He has a complicated history of an attempted Nissen fundoplication in 1999 complicated by a gastric perforation and subsequent peritonitis. He had an emergency exploratory laparotomy by Dr. Daphine Deutscher for perforation and had a long complicated recovery being in the hospital for 2 months. His incision was healing by secondary closure and he developed a ventral hernia which required 2 subsequent surgeries and placement of mesh. His last surgery in 2005 was at [facility] and partially removed some of the mesh but part of the mesh has remained in the abdomen.adherent to the bowl as per patient's report. Patient has had low grade small bowel obstructions intermittently. He has done reasonably well taking MiraLax daily. His burning occurs during the day and at night but he denies any regurgitation of the food or cough. He denies dysphagia or odynophagia. A small bowel follow-through in January 2008 showed no evidence for obstruction. CT scan of the abdomen in December 2007 showed a large ventral hernia with multiple loops of small bowel within the hernia with a suspected mild small bowel partial obstruction due to adhesions. Other medical problems include high blood pressure, anxiety and status post cholecystectomy state.   GI Review of Systems    Reports abdominal pain, acid reflux, bloating, chest pain, and  heartburn.     Location of  Abdominal pain: generalized.    Denies belching, dysphagia with liquids, dysphagia with solids, loss of appetite, nausea, vomiting,  vomiting blood, weight loss, and  weight gain.      Reports irritable bowel syndrome.     Denies anal fissure, black tarry stools, change in bowel habit, constipation, diarrhea, diverticulosis, fecal incontinence, heme positive stool, hemorrhoids, jaundice, light color stool, liver problems, rectal bleeding, and  rectal pain.    Current Medications (verified): 1)  Nexium 40 Mg  Cpdr (Esomeprazole Magnesium) .... Take 1 By Mouth 2)  Wellbutrin Xl 300 Mg  Tb24 (Bupropion Hcl) .... Take 1 By Mouth Qd 3)  Quinapril Hcl 20 Mg  Tabs (Quinapril Hcl) .... Qd 4)  Miralax   Powd (Polyethylene Glycol 3350) .... Dissolve 1 Capsule in Water Once Daily 5)  Xanax 1 Mg Tabs (Alprazolam) .Marland Kitchen.. 1 Qam  Allergies (verified): 1)  Ace Inhibitors  Past History:  Past Medical History: Reviewed history from 04/02/2008 and no changes required. Anxiety Depression GERD Hypertension Rest Lung Dz IBS Dyslipidemia Pernicous Anemia/low Vit B12 Cough 2 ACE Inhibitors Nephrolithiasis, hx of Allergic rhinitis Diverticulosis, colon hx of elevated PSA  Past Surgical History: Reviewed history from 12/13/2009 and no changes required. Left hand surgery (1973) Left knee surgery (1997) Cholecystectomy (0454) Multiple Abdominal Surgeries Repair Ventral Hernia EDG (09/28/2004) EKG (07/12/2006) s/p Nissen Fundoplication 1999 complicated by: gastric perforation, peritonitis, ventral hernia Right Lithotripsy  Family History: Reviewed history from 04/02/2008 and no changes required. mother with DM, CAD/CABG brother and sister with DM brother with HTN  Social History: Reviewed history from 04/02/2008 and no changes required. 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 Former Smoker Alcohol  use-no 2 children  Review of Systems       The patient complains of allergy/sinus, anxiety-new, back pain, depression-new, and fatigue.  The patient denies anemia, arthritis/joint pain, blood in urine, breast  changes/lumps, change in vision, confusion, cough, coughing up blood, fainting, fever, headaches-new, hearing problems, heart murmur, heart rhythm changes, itching, muscle pains/cramps, night sweats, nosebleeds, shortness of breath, skin rash, sleeping problems, sore throat, swelling of feet/legs, swollen lymph glands, thirst - excessive, urination - excessive, urination changes/pain, urine leakage, vision changes, and voice change.         Pertinent positive and negative review of systems were noted in the above HPI. All other ROS was otherwise negative.   Vital Signs:  Patient profile:   60 year old male Height:      71.5 inches Weight:      263 pounds BMI:     36.30 BSA:     2.38 Pulse rate:   72 / minute Pulse rhythm:   regular BP sitting:   122 / 76  (left arm) Cuff size:   regular  Vitals Entered By: Ok Anis CMA (December 15, 2009 1:39 PM)  Physical Exam  General:  alert, oriented, very pleasant and cooperative. He appears somewhat depressed. Eyes:  PERRLA, no icterus. Mouth:  No deformity or lesions, dentition normal. Neck:  Supple; no masses or thyromegaly. Chest Wall:  there is no tenderness over the sternum or of the costochondral junctions. Lungs:  Clear throughout to auscultation. Heart:  Regular rate and rhythm; no murmurs, rubs,  or bruits. Abdomen:  large vertical scar with a large ventral hernia above the umbilicus measuring at least 10 cm in width. There is mild diffuse tenderness in the epigastrium but no palpable mass. There is no ascites. Bowel sounds are normal active. Rectal:  normal rectal tone. Stool is Hemoccult negative. Skin:  Intact without significant lesions or rashes. Psych:  Alert and cooperative. Normal mood and affect.   Impression & Recommendations:  Problem # 1:  GERD (ICD-530.81)  Patient has a history of severe gastroesophageal reflux and erosive esophagitis. He is status post failed fundoplication complicated by perforation in 1999. He  is status post partial fundoplication by Dr. Daphine Deutscher. Now he is having a recurrence of gastroesophageal reflux causing burning. We need to rule out Barrett's esophagus and will do biopsies to do that. We also need to rule out H.Pylori Gastropathy. We will proceed with an upper endoscopy. We will also increase his Nexium to 40 mg twice a day and consider adding Carafate or Reglan depending on the findings of endoscopy.No obvious reason for increased reflux, except for a weight gain, or possibly due to partial SBO  Orders: EGD (EGD)  Problem # 2:  COLONIC POLYPS, HYPERPLASTIC, HX OF (ICD-V12.72) Patient's last colonoscopy was in October 2007. His next colonoscopy will be due October 2012.  Patient Instructions: 1)  increase Nexium to 40 mg p.o. b.i.d. 2)  Align one p.o. q.d. 3)  Upper endoscopy with biopsies. 4)  Consider adding Carafate or Reglan depending on the findings of endoscopy. 5)  Copy sent to : Dr S.Ellison 6)  The medication list was reviewed and reconciled.  All changed / newly prescribed medications were explained.  A complete medication list was provided to the patient / caregiver.

## 2010-12-06 NOTE — Progress Notes (Signed)
Summary: BA Esophagram/REV Scheduled   Phone Note Call from Patient Call back at Home Phone [phone] Call back at [phone]   Caller: spouse Aloha Sink Call For: Dr. Juanda Chance Reason for Call: Talk to Nurse Summary of Call: Pt is calling about some test that are suppose to be schedule. Initial call taken by: Karna Christmas,  December 22, 2009 11:56 AM  Follow-up for Phone Call        Pt. has a Ba Esophagram scheduled at [facility] on 12-28-09 at 11:30am. All instructions reviewed w/Mrs.Klasen by phone. Pt. to keep scheduled office visit 01-21-10 at 8:45am. Pt. instructed to call back as needed.  Follow-up by: Laureen Ochs LPN,  December 22, 2009 12:09 PM

## 2010-12-06 NOTE — Op Note (Signed)
Summary: Ventral Hernia Repair (Dr M.Martin)                    Eligha Bridegroom. [facility]  Patient:    [name], [name]                 MRN: [id number] Adm. Date:  [id number] Disc. Date: [id number] Attending:  Katha Cabal                           Discharge Summary  ADMISSION DIAGNOSIS:  Giant ventral hernia.  PROCEDURE:  Ventral hernia repair with dual mesh fenestrated - July 30, 2000.  HOSPITAL COURSE:  [name] is a 60 year old gentleman who came in as an a.m. admission for ventral hernia repair.  He had a giant ventral hernia repaired with Gore-Tex dual mesh corduroy antimicrobial mesh.  Postoperatively he had a fair amount of pain as expected since his diastasis of his recti was so great.  He, however, got along better, began passing flatus and was started on liquids.  Sutures and staples were left in his wound at the time of his discharge on August 04, 2000.  FINAL DIAGNOSIS:  Large ventral hernia status post repair with Gore-Tex corduroy dual mesh. DD:  08/21/00 TD:  08/21/00 Job: [id number]

## 2010-12-06 NOTE — Letter (Signed)
Summary: [name]  [name]   Imported By: Lennie Odor 12/13/2009 16:44:25  _____________________________________________________________________  External Attachment:    Type:   Image     Comment:   External Document

## 2010-12-06 NOTE — Letter (Signed)
Summary: Office Visit Letter  [facility]  [address] [place], [place] [address]   Phone: [phone]  Fax: [phone]      April 21, 2010 MRN: [id number]   [name] [address] [place], [place]  [address]   Dear [name],   According to our records, it is time for you to schedule a follow-up office visit with [place].   At your convenience, please call [phone] (option #2)to schedule an office visit. If you have any questions, concerns, or feel that this letter is in error, we would appreciate your call.   Sincerely,  Hedwig Morton. Juanda Chance, M.D.  [facility] [phone]

## 2010-12-06 NOTE — Procedures (Signed)
Summary: COLON   Colonoscopy  Procedure date:  09/03/2006  Findings:      Location:  [facility].   Patient Name: [name], [name] MRN:  Procedure Procedures: Colonoscopy CPT: [phone].  Personnel: Endoscopist: Vania Rea. Jarold Motto, MD.  Referred By: Cleophas Dunker Everardo All, MD.  Exam Location: Exam performed in Outpatient Clinic. Outpatient  Patient Consent: Procedure, Alternatives, Risks and Benefits discussed, consent obtained, from patient. Consent was obtained by the RN.  Indications Symptoms: Constipation Patient has difficulty evacuating, strains with stool passage.  Average Risk Screening Routine.  History  Current Medications: Patient is not currently taking Coumadin.  Medical/ Surgical History: Hypertension, Fundoplication, Incisional hernia repair, Anxiety Disorder, Depression,  Pre-Exam Physical: Performed Sep 03, 2006. Cardio-pulmonary exam, Rectal exam WNL. Abdominal exam abnormal. Extremity exam, Mental status exam WNL.  Comments: Pt. history reviewed/updated, physical exam performed prior to initiation of sedation? Exam Exam: Extent of exam reached: Cecum, extent intended: Cecum.  The cecum was identified by appendiceal orifice and IC valve. Patient position: on left side. Colon retroflexion performed. Images taken. ASA Classification: II. Tolerance: excellent.  Monitoring: Pulse and BP monitoring, Oximetry used. Supplemental O2 given. at 2 Liters.  Colon Prep Used Golytely for colon prep. Prep results: good.  Sedation Meds: Patient assessed and found to be appropriate for moderate (conscious) sedation. Fentanyl 100 mcg. given IV. Versed 10 mg. given IV.  Instrument(s): CF 140L. Serial [id number].  Findings - DIVERTICULOSIS: Descending Colon to Sigmoid Colon. Not bleeding. ICD9: Diverticulosis, Colon: 562.10.  - NORMAL EXAM: Cecum to Rectum. Not Seen: AVM's. Colitis. Tumors. Melanosis. Crohn's. Hemorrhoids.  - MULTIPLE POLYPS:  Sigmoid Colon to Rectum. minimum size 1 mm, maximum size 4 mm. Procedure:  hot biopsy, removed, Polyp retrieved, Polyps sent to pathology. ICD9: Colon Polyps: 211.3.   Assessment  Diagnoses: 562.10: Diverticulosis, Colon.  211.3: Colon Polyps.   Events  Unplanned Interventions: No intervention was required.  Plans Medication Plan: Continue current medications.  Patient Education: Patient given standard instructions for: Polyps. Diverticulosis. Patient instructed to get routine colonoscopy every 3 years.  Disposition: After procedure patient sent to recovery. After recovery patient sent home.  Scheduling/Referral: Follow-Up prn. Await pathology to schedule patient.   This report was created from the original endoscopy report, which was reviewed and signed by the above listed endoscopist.

## 2010-12-06 NOTE — Assessment & Plan Note (Signed)
Summary: YEARLY FU/ MEDICARE/ LABS SAME DAY/NWS  #   Vital Signs:  Patient profile:   60 year old male Height:      71.5 inches (181.61 cm) Weight:      259.13 pounds (117.79 kg) BMI:     35.77 O2 Sat:      97 % on Room air Temp:     97.6 degrees F (36.44 degrees C) oral Pulse rate:   77 / minute BP sitting:   108 / 78  (left arm) Cuff size:   large  Vitals Entered By: Brenton Grills CMA (AAMA) (September 02, 2010 8:30 AM)  O2 Flow:  Room air CC: Yearly F/U/pt is not currently taking Sucralfate tablets/aj Is Patient Diabetic? No   Referring Xcaret Morad:  Romero Belling, MD  Primary Darran Gabay:  Romero Belling, MD   CC:  Yearly F/U/pt is not currently taking Sucralfate tablets/aj.  History of Present Illness: here for regular wellness examination.  He's feeling pretty well in general, and does not drink or smoke.  Current Medications (verified): 1)  Nexium 40 Mg  Cpdr (Esomeprazole Magnesium) .... Take 1 Tablet By Mouth Once A Day 2)  Wellbutrin Xl 300 Mg  Tb24 (Bupropion Hcl) .... Take 1 By Mouth Qd 3)  Quinapril Hcl 20 Mg  Tabs (Quinapril Hcl) .... Qd 4)  Miralax   Powd (Polyethylene Glycol 3350) .... Dissolve 1 Capsule in Water Once Daily 5)  Xanax 1 Mg Tabs (Alprazolam) .Marland Kitchen.. 1 Qam 6)  Sucralfate 1 Gm Tabs (Sucralfate) .Marland Kitchen.. 1 Tablet By Mouth 1-2 Times Per Day 7)  Prilosec 20 Mg Cpdr (Omeprazole) .... Take 1 Tablet By Mouth At Bedtime 8)  Carafate 1 Gm/46ml Susp (Sucralfate) .... 2 Teaspoons As Needed  Allergies (verified): 1)  Ace Inhibitors  Past History:  Past Medical History: Anxiety Depression GERD Hypertension Rest Lung Dz IBS Dyslipidemia Pernicous Anemia/low Vit B12 Cough 2 ACE Inhibitors Nephrolithiasis, hx of Allergic rhinitis Diverticulosis, colon hx of elevated PSA  urol:  [facility] gi: [facility] psych: dr Evelene Croon surg: dr Daphine Deutscher podiatry: dr Ulice Brilliant derm: dr tafeen  Family History: Reviewed history from 06/15/2010 and no changes required. mother with DM,  CAD/CABG brother and sister with DM brother with HTN No FH of Colon Cancer:  Social History: Reviewed history from 06/15/2010 and no changes required. 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 Former Smoker Alcohol use-no 2 children Daily Caffeine Use  Review of Systems  The patient denies fever, weight loss, weight gain, vision loss, decreased hearing, chest pain, syncope, dyspnea on exertion, prolonged cough, headaches, abdominal pain, melena, hematochezia, severe indigestion/heartburn, hematuria, and suspicious skin lesions.         he sees psych for depression  Physical Exam  General:  obese.  no distress  Head:  head: no deformity eyes: no periorbital swelling, no proptosis external nose and ears are normal mouth: no lesion seen Neck:  Supple without thyroid enlargement or tenderness. No cervical lymphadenopathy, neck masses or tracheal deviation. has tracheostomy scar Lungs:  Clear to auscultation bilaterally. Normal respiratory effort.  Heart:  Regular rate and rhythm without murmurs or gallops noted. Normal S1,S2.   Abdomen:  has large self-reducing abdominal hernia, and several healed surgical scars abdomen is soft, nontender.  no hepatosplenomegaly.   not distended.   Rectal:  sees urology  Prostate:  sees urology  Msk:  muscle bulk and strength are grossly normal.  no obvious joint swelling.  gait is normal and steady  Neurologic:  cn 2-12 grossly intact.  readily moves all 4's.    Skin:  normal texture and temp.  no rash.  not diaphoretic  Cervical Nodes:  No significant adenopathy.  Psych:  Alert and cooperative; normal mood and affect; normal attention span and concentration.   Additional Exam:  SEPARATE EVALUATION FOLLOWS--EACH PROBLEM HERE IS NEW, NOT RESPONDING TO TREATMENT, OR POSES SIGNIFICANT RISK TO THE PATIENT'S HEALTH: HISTORY OF THE PRESENT ILLNESS: pt states 8 mos of moderate pain at both heels, and assoc numbness elev ldl is noted PAST  MEDICAL HISTORY reviewed and up to date today REVIEW OF SYSTEMS: denies foot rash PHYSICAL EXAMINATION: pulses: dorsalis pedis intact bilat.  no carotid bruit neuro: sensation is intact to touch on the feet no deformity.  no ulcer on the feet.  feet are of normal color and temp.  no edema there is tenderness at the med aspect of both heels, but no other abnormality there.  LAB/XRAY RESULTS: LDL Cholesterol      [H]  161 mg/dL   IMPRESSION: heel pain, uncertain etiology dyslipidemia, needs rx PLAN: see instruction sheet   Impression & Recommendations:  Problem # 1:  ROUTINE GENERAL MEDICAL EXAM@HEALTH  CARE FACL (ICD-V70.0)  Medications Added to Medication List This Visit: 1)  Carafate 1 Gm/40ml Susp (Sucralfate) .... 2 teaspoons as needed  Other Orders: T-Parathyroid Hormone, Intact w/ Calcium (09604-54098) EKG w/ Interpretation (93000) TLB-PSA (Prostate Specific Antigen) (84153-PSA) TLB-Hepatic/Liver Function Pnl (80076-HEPATIC) TLB-CBC Platelet - w/Differential (85025-CBCD) TLB-BMP (Basic Metabolic Panel-BMET) (80048-METABOL) TLB-Lipid Panel (80061-LIPID) TLB-TSH (Thyroid Stimulating Hormone) (84443-TSH) TLB-Udip w/ Micro (81001-URINE) Est. Patient Level III (11914) Est. Patient 40-64 years (78295)  Patient Instructions: 1)  cc of test results to dr Ronal Fear. 2)  blood tests are being ordered for you today.  please call [phone] to hear your test results. 3)  please consider these measures for your health:  minimize alcohol.  do not use tobacco products.  have a colonoscopy at least every 10 years from age 48.  keep firearms safely stored.  always use seat belts.  have working smoke alarms in your home.  see an eye doctor and dentist regularly.  never drive under the influence of alcohol or drugs (including prescription drugs).  those with fair skin should take precautions against the sun. 4)  please let me know what your wishes would be, if artificial life support measures  should become necessary.  it is critically important to prevent falling down (keep floor areas well-lit, dry, and free of loose objects) 5)  Please schedule a follow-up appointment in 1 year. 6)  here are some samples of "pennsaid," to take 5 drops three times a day as needed for foot pain.  if you want a prescription, the cream is cheaper. 7)  (update: i left message on phone-tree:  you should take cholesterol medication). Prescriptions: CARAFATE 1 GM/10ML SUSP (SUCRALFATE) 2 teaspoons as needed  #8 oz x 11   Entered and Authorized by:   Minus Breeding MD   Signed by:   Minus Breeding MD on 09/02/2010   Method used:   Electronically to        [facility] (retail)       [address]       [place], [place]  [address]       Ph: [phone]       Fax: [phone]   RxID:   [id number] QUINAPRIL HCL 20 MG  TABS (QUINAPRIL HCL) qd  #30 x  11   Entered and Authorized by:   Minus Breeding MD   Signed by:   Minus Breeding MD on 09/02/2010   Method used:   Electronically to        [facility] (retail)       [address]       [place], [place]  [address]       Ph: [phone]       Fax: [phone]   RxID:   [id number] NEXIUM 40 MG  CPDR (ESOMEPRAZOLE MAGNESIUM) Take 1 tablet by mouth once a day  #30 x 11   Entered and Authorized by:   Minus Breeding MD   Signed by:   Minus Breeding MD on 09/02/2010   Method used:   Electronically to        [facility] (retail)       [address]       [place], [place]  [address]       Ph: [phone]       Fax: [phone]   RxID:   [id number]    Orders Added: 1)  T-Parathyroid Hormone, Intact w/ Calcium [62703-50093] 2)  EKG w/ Interpretation [93000] 3)  TLB-PSA (Prostate Specific Antigen) [81829-HBZ] 4)  TLB-Hepatic/Liver Function Pnl [80076-HEPATIC] 5)  TLB-CBC Platelet - w/Differential [85025-CBCD] 6)  TLB-BMP (Basic Metabolic Panel-BMET) [80048-METABOL] 7)  TLB-Lipid Panel  [80061-LIPID] 8)  TLB-TSH (Thyroid Stimulating Hormone) [84443-TSH] 9)  TLB-Udip w/ Micro [81001-URINE] 10)  Est. Patient Level III [16967] 11)  Est. Patient 40-64 years [99396]   Immunization History:  Influenza Immunization History:    Influenza:  historical (08/06/2010)   Immunization History:  Influenza Immunization History:    Influenza:  Historical (08/06/2010)

## 2010-12-06 NOTE — Discharge Summary (Signed)
Summary: Active Internal Hemorrhoids/Prostatitis                         [facility]  Patient:    [name], [name]                 MRN: [id number] Adm. Date:  [id number] Disc. Date: [id number] Attending:  Duke Salvia CC:         [place]. Daphine Deutscher, M.D.   Discharge Summary  ADMISSION DIAGNOSES: 1. Abdominal pain. 2. Fever. 3. Leukocytosis.  DISCHARGE DIAGNOSES: 1. Prostatitis. 2. Active internal hemorrhoids.  HISTORY OF PRESENT ILLNESS:  The patient was seen in the office by Dr. Romero Belling on the day of admission.  The patient presented complaining of several days of feeling suboptimal in regards to his health.  He had also developed lower abdominal pain, low-grade fever, chills, and was diaphoretic on admission.  He has chronic chest pain and shortness of breath which was unchanged.  See the H&P for past medical history, family history, and social history.  Of note, his history is significant for intra-abdominal infection following Nissen fundoplication with a prolonged hospital stay.  The patient is to have a mesh repair in the future.  ADMISSION MEDICATIONS: 1. Accupril 10 mg daily. 2. Prevacid 30 mg daily. 3. Xanax 1 mg t.i.d. 4. Zoloft 100 mg daily. 5. Desyrel 150 mg q.h.s. 6. Zyrtec 10 mg daily.  PHYSICAL EXAMINATION:  VITAL SIGNS:  Admitting exam per Dr. Everardo All revealed a blood pressure of 90/60, temperature was 99.4.  GENERAL:  The patient did not appear acutely ill.  HEENT, NECK:  There was no adenopathy.  CHEST:  Clear.  ABDOMEN:  Soft, with minimal tenderness across the lower quadrants.  RECTAL:  Quite erythematous around the perirectal area, but no mass or bleeding was noted.  HOSPITAL COURSE:  The patient was admitted to the hospital and started on IV antibiotics with Levaquin 500 mg IV q.d.  The patient was seen in consultation by general surgery to rule out any intra-abdominal process.  During his hospital stay, he had  a repeat vascular exam which revealed significant tenderness of the prostate as well as tenderness just inside the rectum.  He had had an episode of mild hematochezia.  The patient did well during his hospital stay.  He felt improved by the day of discharge on antibiotics.  Working diagnosis to explain his abdominal pain, discomfort, and fever was prostatitis.  He also had internal hemorrhoids.  PROCEDURES:  The patient had a CT scan of the abdomen which was unremarkable.  DISCHARGE EXAMINATION:  The patient is afebrile.  Vital signs were stable. Heart was regular, with no murmurs.  Abdomen was obese, soft, with positive bowel sounds, with decreased tenderness.  Repeat rectal exam was not performed.  DISCHARGE MEDICATIONS: 1. Cipro 500 mg b.i.d. for a total of 14 days. 2. Anusol-HC suppository 2.5% to use b.i.d. p.r.n. rectal pain. 3. The patient will resume all of his home medications. DD:  06/13/00 TD:  06/14/00 Job: [id number]

## 2010-12-06 NOTE — Letter (Signed)
Summary: Appt Reminder 2  [facility]  [address] [place], [place] [address]   Phone: [phone]  Fax: [phone]        December 22, 2009 MRN: [id number]    [name] [address] [place], [place]  [address]    Dear [name],   You have a return appointment with Dr.Dora Juanda Chance on 01-21-10 at 8:45am. Please remember to bring a complete list of the medicines you are taking, your insurance card and your co-pay.  If you have to cancel or reschedule this appointment, please call before 5:00 pm the evening before to avoid a cancellation fee.  If you have any questions or concerns, please call [phone].    Sincerely,    Laureen Ochs LPN  Appended Document: Appt Reminder 2 Letter mailed to patient.

## 2010-12-06 NOTE — Assessment & Plan Note (Signed)
Summary: f/u//acid reflux--ch.    History of Present Illness Visit Type: Follow-up Visit Primary GI MD: [name] MD Primary Provider: Romero Belling, MD  Requesting Provider: Romero Belling, MD  Chief Complaint: follow-up acid reflux pt. still c/o burning in chest that radiates into back. History of Present Illness:   60 year old, white male with complicated history also failed for Nissen fundoplication in 1999 complicated by gastric perforation and peritonitis. He underwent emergency exploratory laparotomy by Dr. Daphine Deutscher and subsequently developed large ventral incisional hernia which was repaired on at least 2 occasions with placement of a mesh. He has a  history of partial small bowel obstruction due to adhesions in 2008. Last small bowel follow-through did not show any obstruction. Upper endoscopy in February 2011 showed 2-3 cm hiatal hernia, Nissen fundoplication and irregular Z line which did not show Barrett's esophagus. Barium esophagram in February 2011 showed normal peristalsis with a small sliding hiatal hernia and one episode of reflux. He was initially improved on Nexium 40 mg twice a day Reglan 10 mg at bedtime and Carafate slurry but has   decreased  his medications and now is having episodic occult reflux episodes as well as some nausea   GI Review of Systems    Reports acid reflux and  chest pain.      Denies abdominal pain, belching, bloating, dysphagia with liquids, dysphagia with solids, heartburn, loss of appetite, nausea, vomiting, vomiting blood, weight loss, and  weight gain.      Reports constipation.     Denies anal fissure, black tarry stools, change in bowel habit, diarrhea, diverticulosis, fecal incontinence, heme positive stool, hemorrhoids, irritable bowel syndrome, jaundice, light color stool, liver problems, rectal bleeding, and  rectal pain.    Current Medications (verified): 1)  Nexium 40 Mg  Cpdr (Esomeprazole Magnesium) .... Take 1 Tablet By Mouth Once A  Day 2)  Wellbutrin Xl 300 Mg  Tb24 (Bupropion Hcl) .... Take 1 By Mouth Qd 3)  Quinapril Hcl 20 Mg  Tabs (Quinapril Hcl) .... Qd 4)  Miralax   Powd (Polyethylene Glycol 3350) .... Dissolve 1 Capsule in Water Once Daily 5)  Xanax 1 Mg Tabs (Alprazolam) .Marland Kitchen.. 1 Qam 6)  Sucralfate 1 Gm Tabs (Sucralfate) .Marland Kitchen.. 1 Tablet By Mouth 1-2 Times Per Day 7)  Prilosec 20 Mg Cpdr (Omeprazole) .... Take 1 Tablet By Mouth At Bedtime  Allergies (verified): 1)  Ace Inhibitors  Past History:  Past Medical History: Reviewed history from 04/02/2008 and no changes required. Anxiety Depression GERD Hypertension Rest Lung Dz IBS Dyslipidemia Pernicous Anemia/low Vit B12 Cough 2 ACE Inhibitors Nephrolithiasis, hx of Allergic rhinitis Diverticulosis, colon hx of elevated PSA  Past Surgical History: Reviewed history from 12/13/2009 and no changes required. Left hand surgery (1973) Left knee surgery (1997) Cholecystectomy (2956) Multiple Abdominal Surgeries Repair Ventral Hernia EDG (09/28/2004) EKG (07/12/2006) s/p Nissen Fundoplication 1999 complicated by: gastric perforation, peritonitis, ventral hernia Right Lithotripsy  Family History: Reviewed history from 04/02/2008 and no changes required. mother with DM, CAD/CABG brother and sister with DM brother with HTN No FH of Colon Cancer:  Social History: Reviewed history from 04/02/2008 and no changes required. 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  The patient denies allergy/sinus, anemia, anxiety-new, arthritis/joint pain, blood in urine, breast changes/lumps, change in vision, confusion, cough, coughing up blood, depression-new, fainting, fatigue, fever,  headaches-new, hearing problems, heart murmur, heart rhythm changes, itching, muscle pains/cramps, night sweats, nosebleeds, shortness  of breath, skin rash, sleeping problems, sore throat, swelling of feet/legs, swollen lymph glands, thirst - excessive, urination - excessive, urine leakage, vision changes, and voice change.         Pertinent positive and negative review of systems were noted in the above HPI. All other ROS was otherwise negative.   Vital Signs:  Patient profile:   60 year old male Height:      71.5 inches Weight:      257.13 pounds BMI:     35.49 Pulse rate:   68 / minute Pulse rhythm:   regular BP sitting:   126 / 80  (right arm)  Vitals Entered By: Milford Cage NCMA (June 15, 2010 10:05 AM)  Physical Exam  General:  Well developed, well nourished, no acute distress. Eyes:  PERRLA, no icterus. Mouth:  No deformity or lesions, dentition normal. Neck:  Supple; no masses or thyromegaly. Lungs:  Clear throughout to auscultation. Heart:  Regular rate and rhythm; no murmurs, rubs,  or bruits. Abdomen:  soft mildly obese abdomen with large well-healed vertical incision in epigastrium. Marked tenderness of the surrounding abdominal wall around the excision. There is a separation of the rectus muscle within the scar but no obvious oral hernia. Bowel sounds are normal active. Extremities:  No clubbing, cyanosis, edema or deformities noted. Skin:  Intact without significant lesions or rashes. Psych:  Alert and cooperative. Normal mood and affect.   Impression & Recommendations:  Problem # 1:  GERD (ICD-530.81) Patient has chronic gastroesophageal reflux disease and is status post  failed Nissen fundoplication. There is radiographic evidence that patient continues to have reflux. He has to modify his diet to decrease the size of the evening meals also decrease the food he eats to favor low-fat of foods. We will increase his Nexium to 40 mg twice a day and add Carafate slurry twice a day. He will call us backif he is interested in resuming his Reglan 10 mg at bedtime.  Problem # 2:  COLONIC POLYPS,  HYPERPLASTIC, HX OF (ICD-V12.72) Patient's last colonoscopy was in October 2007. A recall colonoscopy will be due in October 2012.  Patient Instructions: 1)  Carafate slurry 10 cc p.o. b.i.d. (patient will actually use capsules and dissolve them into slurry form), he may use Amphojel or Alternagel as a substitute for Carafate slurry if needed 2)  Nexium 40 mg p.o. b.i.d. 3)  Strict antireflux measures including dietary modifications. Patient could lose at least 10 or 15 pounds. 4)  He will call us back if he wants his Reglan restarted at 10 mg at bedtime. 5)  I will see him in 6 months. 6)  Copy sent to : Dr Romero Belling 7)  The medication list was reviewed and reconciled.  All changed / newly prescribed medications were explained.  A complete medication list was provided to the patient / caregiver.

## 2010-12-06 NOTE — Procedures (Signed)
Summary: Upper Endoscopy  Patient: [name] Note: All result statuses are Final unless otherwise noted.  Tests: (1) Upper Endoscopy (EGD)   EGD Upper Endoscopy       DONE     [facility]     [address]     [place], [place]  [address]           ENDOSCOPY PROCEDURE REPORT           PATIENT:  [name], [name]  MR#:  [id number]     BIRTHDATE:  11-08-1950, 58 yrs. old  GENDER:  male           ENDOSCOPIST:  Hedwig Morton. Juanda Chance, MD     Referred by:  Cleophas Dunker Everardo All, M.D.           PROCEDURE DATE:  12/21/2009     PROCEDURE:  EGD with biopsy     ASA CLASS:  Class II     INDICATIONS:  heartburn, GERD gastric perforation during attemted     Nissen Fundoplication in 1999; s/p re-wrap 1999, ventral hernia,     adhesions, partial SBO     now increase GERD           MEDICATIONS:   Versed 8 mg, Fentanyl 75 mcg     TOPICAL ANESTHETIC:  Cetacaine Spray           DESCRIPTION OF PROCEDURE:   After the risks benefits and     alternatives of the procedure were thoroughly explained, informed     consent was obtained.  The LB GIF-H180 [id number] endoscope was     introduced through the mouth and advanced to the second portion of     the duodenum, without limitations.  The instrument was slowly     withdrawn as the mucosa was fully examined.     <<PROCEDUREIMAGES>>           A hiatal hernia was found (see image3, image4, and image10). 2-3     cm sliding hiatal hernia,  Post-operative change was noted (see     image5 and image4). s/p partial Nissen, very loose  irregular     Z-line. With standard forceps, a biopsy was obtained and sent to     pathology (see image1 and image2).  Mild gastritis was found.     Random biopsies were obtained and sent to pathology. With standard     forceps, a biopsy was obtained and sent to pathology (see image8).     Otherwise the examination was normal (see image7).    Retroflexed     views revealed no abnormalities.    The scope was then  withdrawn     from the patient and the procedure completed.           COMPLICATIONS:  None           ENDOSCOPIC IMPRESSION:     1) Hiatal hernia     2) Post-operative change     3) Irregular Z-line     4) Mild gastritis     5) Otherwise normal examination     no acute mucosal inflammation,     obviously loose diaphragmatic hiatus with sliding hernia likely     causing GERD     RECOMMENDATIONS:     Ba esophagram     add reglan 10 mg po hs, # 30, 2 refill     Nexiem 40 mg po bid     Carafate 1 gm po bid, # 60,  2 refill     OV 4-6 weeks           REPEAT EXAM:  In 0 year(s) for.           ______________________________     Hedwig Morton. Juanda Chance, MD           CC:  Luretha Murphy, MD           n.     Rosalie DoctorHedwig Morton. Shakiah Wester at 12/21/2009 03:15 PM           [place], [place], [id number]  Note: An exclamation mark (!) indicates a result that was not dispersed into the flowsheet. Document Creation Date: 12/21/2009 3:16 PM _______________________________________________________________________  (1) Order result status: Final Collection or observation date-time: 12/21/2009 14:55 Requested date-time:  Receipt date-time:  Reported date-time:  Referring Physician:   Ordering Physician: [name] [phone]) Specimen Source:  Source: Launa Grill Order Number: [id number] Lab site:

## 2010-12-06 NOTE — Letter (Signed)
Summary: New Patient letter  [facility]  [address] [place], [place] [address]   Phone: [phone]  Fax: [phone]       12/08/2009 MRN: [id number]  [name] [address] [place], [place]  [address]  Dear [name],  Welcome to the Gastroenterology Division at [facility].    You are scheduled to see Dr.  Juanda Chance  on 01-25-10 at 2:45PM on the 3rd floor at [facility], [address]  We ask that you try to arrive at our office 15 minutes prior to your appointment time to allow for check-in.  We would like you to complete the enclosed self-administered evaluation form prior to your visit and bring it with you on the day of your appointment.  We will review it with you.  Also, please bring a complete list of all your medications or, if you prefer, bring the medication bottles and we will list them.  Please bring your insurance card so that we may make a copy of it.  If your insurance requires a referral to see a specialist, please bring your referral form from your primary care physician.  Co-payments are due at the time of your visit and may be paid by cash, check or credit card.     Your office visit will consist of a consult with your physician (includes a physical exam), any laboratory testing he/she may order, scheduling of any necessary diagnostic testing (e.g. x-ray, ultrasound, CT-scan), and scheduling of a procedure (e.g. Endoscopy, Colonoscopy) if required.  Please allow enough time on your schedule to allow for any/all of these possibilities.    If you cannot keep your appointment, please call [phone] to cancel or reschedule prior to your appointment date.  This allows [place] the opportunity to schedule an appointment for another patient in need of care.  If you do not cancel or reschedule by 5 p.m. the business day prior to your appointment date, you will be charged a $50.00 late cancellation/no-show fee.    Thank you for choosing  [facility] for your medical needs.  We appreciate the opportunity to care for you.  Please visit [place] at our website  to learn more about our practice.                     Sincerely,                                                             The Gastroenterology Division

## 2010-12-06 NOTE — Progress Notes (Signed)
Summary: Switch from Dr Jarold Motto to Dr Juanda Chance   Phone Note Call from Patient Call back at [facility] Phone [phone] Call back at or [phone]   Caller: Spouse Call For: Dr Jarold Motto Summary of Call: Wants to switch from Dr Jarold Motto to Dr Juanda Chance. Says there is no reason at all wife sees Dr Juanda Chance and he wants the same Dr. Initial call taken by: Leanor Kail [facility],  December 06, 2009 9:37 AM  Follow-up for Phone Call        To Dr. Jarold Motto. Follow-up by: Ashok Cordia RN,  December 06, 2009 9:57 AM  Additional Follow-up for Phone Call Additional follow up Details #1::        OK Additional Follow-up by: Mardella Layman MD FACG,  December 06, 2009 10:31 AM    Additional Follow-up for Phone Call Additional follow up Details #2::    OK Follow-up by: Hart Carwin MD,  December 06, 2009 10:01 PM   Appended Document: Switch from Dr Jarold Motto to Dr Juanda Chance Please let pt know OK to switch doctors.

## 2010-12-06 NOTE — Letter (Signed)
Summary: [facility]  [facility]   Imported By: Sherian Rein 10/07/2010 09:19:21  _____________________________________________________________________  External Attachment:    Type:   Image     Comment:   External Document

## 2010-12-06 NOTE — Assessment & Plan Note (Signed)
Summary: F/U FROM ENDO, BA ESOPHAGRAM            DEBORAH    History of Present Illness Primary GI MD: [name] MD Primary Provider: Romero Belling, MD  Requesting Provider: Romero Belling, MD  Chief Complaint: f/u EGD, Barium esophagram. Pt states since he has started the Reglan and the Carafate his symptoms of heartburn and pain are gone. Pt has not had burning in a week. He has decreased reglan and carafate some. History of Present Illness:   This is a 60 year old, white male with severe gastroesophageal reflux. She is status post attempted Nissen fundoplication in 1999 which was complicated by gastric perforation and peritonitis. He had an emergency exploratory laparotomy by Dr. Daphine Deutscher resulting in a prolonged hospitalization and development of a large ventral hernia requiring 2 subsequent surgeries and placement of mesh. His last surgery was at [facility] and included partial removal of the mesh. He has had partial small bowel obstructions intermittently. He is status post cholecystectomy. A small bowel follow-through in January 2008 showed no obstruction. An upper endoscopy on 12/21/09 showed a Nissen fundoplication, 2-3 cm hiatal hernia and irregular Z line. The biopsies showed mild inflammation at the GE junction. A barium esophagram on 12/29/99 showed normal peristalsis with a small sliding hiatal hernia. Under fluoroscopy he had  a single episode of reflux. There was slow clearing of the barium from esophagus. A 13 mm tablet passed without delay. Patient is much improved on Carafate and Nexium twice a day.   GI Review of Systems      Denies abdominal pain, acid reflux, belching, bloating, chest pain, dysphagia with liquids, dysphagia with solids, heartburn, loss of appetite, nausea, vomiting, vomiting blood, weight loss, and  weight gain.        Denies anal fissure, black tarry stools, change in bowel habit, constipation, diarrhea, diverticulosis, fecal incontinence, heme positive  stool, hemorrhoids, irritable bowel syndrome, jaundice, light color stool, liver problems, rectal bleeding, and  rectal pain.    Current Medications (verified): 1)  Nexium 40 Mg  Cpdr (Esomeprazole Magnesium) .... Take 1 By Mouth 2)  Wellbutrin Xl 300 Mg  Tb24 (Bupropion Hcl) .... Take 1 By Mouth Qd 3)  Quinapril Hcl 20 Mg  Tabs (Quinapril Hcl) .... Qd 4)  Miralax   Powd (Polyethylene Glycol 3350) .... Dissolve 1 Capsule in Water Once Daily 5)  Xanax 1 Mg Tabs (Alprazolam) .Marland Kitchen.. 1 Qam 6)  Reglan 10 Mg  Tabs (Metoclopramide Hcl) .... By Mouth Hs 7)  Carafate 1 Gm/31ml Susp (Sucralfate) .Marland Kitchen.. 10cc By Mouth 4 Times Daily.  Allergies (verified): 1)  Ace Inhibitors  Past History:  Past Medical History: Reviewed history from 04/02/2008 and no changes required. Anxiety Depression GERD Hypertension Rest Lung Dz IBS Dyslipidemia Pernicous Anemia/low Vit B12 Cough 2 ACE Inhibitors Nephrolithiasis, hx of Allergic rhinitis Diverticulosis, colon hx of elevated PSA  Past Surgical History: Reviewed history from 12/13/2009 and no changes required. Left hand surgery (1973) Left knee surgery (1997) Cholecystectomy (1914) Multiple Abdominal Surgeries Repair Ventral Hernia EDG (09/28/2004) EKG (07/12/2006) s/p Nissen Fundoplication 1999 complicated by: gastric perforation, peritonitis, ventral hernia Right Lithotripsy  Family History: Reviewed history from 04/02/2008 and no changes required. mother with DM, CAD/CABG brother and sister with DM brother with HTN  Social History: Reviewed history from 04/02/2008 and no changes required. 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 Former Smoker Alcohol use-no 2 children  Review of Systems  The patient denies allergy/sinus, anemia, anxiety-new, arthritis/joint pain, back pain, blood  in urine, breast changes/lumps, change in vision, confusion, cough, coughing up blood, depression-new, fainting, fatigue, fever, headaches-new, hearing  problems, heart murmur, heart rhythm changes, itching, menstrual pain, muscle pains/cramps, night sweats, nosebleeds, pregnancy symptoms, shortness of breath, skin rash, sleeping problems, sore throat, swelling of feet/legs, swollen lymph glands, thirst - excessive , urination - excessive , urination changes/pain, urine leakage, vision changes, and voice change.         Pertinent positive and negative review of systems were noted in the above HPI. All other ROS was otherwise negative.   Vital Signs:  Patient profile:   60 year old male Height:      71.5 inches Weight:      257.38 pounds BMI:     35.52 Pulse rate:   72 / minute Pulse rhythm:   regular BP sitting:   126 / 70  (left arm) Cuff size:   regular  Vitals Entered By: Christie Nottingham CMA Duncan Dull) (January 21, 2010 8:56 AM)   Impression & Recommendations:  Problem # 1:  GERD (ICD-530.81) Patient has had marked improvement of gastroesophageal reflux with Carafate and high-dose Nexium. We will continue him on the Carafate slurry. He may use the remaining Carafate tablets to be crushed and use them as directed. His insurance covers only 30 tablets of Nexium. We will supplement Nexium with Prilosec 40 mg at bedtime. We will see him in 8 weeks. He has lost 14 pounds intentionally and continues to lose weight.  Problem # 2:  COLONIC POLYPS, HYPERPLASTIC, HX OF (ICD-V12.72) Patinet's last colonoscopy was in November 2007. A recall colonsocopy will be due in November 2012.  Patient Instructions: 1)  recall colonoscopy November 2012. 2)  Nexium 40 mg p.o. a.m. Prilosec 40 mg p.o. h.s. 3)  Carafate slurry 10 cc p.o. q.i.d. May use crushed Carafate tablets if needed. 4)  antireflux measures. 5)  Office visit in 2 weeks. 6)  Copy sent to : Dr S.Ellison 7)  The medication list was reviewed and reconciled.  All changed / newly prescribed medications were explained.  A complete medication list was provided to the patient /  caregiver. Prescriptions: SUCRALFATE 1 GM/10ML SUSP (SUCRALFATE) Take 10 cc (2 teaspoons daily) four times daily (generic only!!!)  #10 ounces x 1   Entered by:   Hortense Ramal CMA (AAMA)   Authorized by:   Hart Carwin MD   Signed by:   Hortense Ramal CMA (AAMA) on 01/21/2010   Method used:   Electronically to        [facility]* (retail)       [address]       [place], [place]  [address]       Ph: [phone]       Fax: [phone]   RxID:   [id number] PRILOSEC 20 MG CPDR (OMEPRAZOLE) Take 1 tablet by mouth at bedtime  #30 x 3   Entered by:   Hortense Ramal CMA (AAMA)   Authorized by:   Hart Carwin MD   Signed by:   Hortense Ramal CMA (AAMA) on 01/21/2010   Method used:   Electronically to        [facility] (retail)       [address]       [place], [place]  [address]       Ph: [phone]       Fax: [phone]  RxID:   [id number] NEXIUM 40 MG  CPDR (ESOMEPRAZOLE MAGNESIUM) Take 1 tablet by mouth once a day  #30 x 3   Entered by:   Hortense Ramal CMA (AAMA)   Authorized by:   Hart Carwin MD   Signed by:   Hortense Ramal CMA (AAMA) on 01/21/2010   Method used:   Electronically to        [facility] (retail)       [address]       [place], [place]  [address]       Ph: [phone]       Fax: [phone]   RxID:   [id number]

## 2010-12-06 NOTE — Progress Notes (Signed)
----   Converted from flag ---- ---- 06/21/2010 11:50 AM, Hilarie Fredrickson wrote: WIFE WILL CALL BACK TO SCHEDULE.  ---- 06/18/2010 12:19 PM, Lamar Sprinkles, CMA wrote: Patient is due for follow up office visit with Everardo All  Please help schedule  THANK U! ------------------------------

## 2010-12-06 NOTE — Letter (Signed)
Summary: EGD Instructions  [facility]  [address] [place], [place] [address]   Phone: [phone]  Fax: [phone]       AUGUSTO DECKMAN    27-Nov-1950    MRN: [id number]       Procedure Day /Date: 12/21/09 (Tuesday)     Arrival Time: 1:30 pm     Procedure Time: 2:30 pm     Location of Procedure:                    _x  _ [facility] (4th Floor)  PREPARATION FOR ENDOSCOPY   On 12/21/09 THE DAY OF THE PROCEDURE:  1.   No solid foods, milk or milk products are allowed after midnight the night before your procedure.  2.   Do not drink anything colored red or purple.  Avoid juices with pulp.  No orange juice.  3.  You may drink clear liquids until 12:30 pm, which is 2 hours before your procedure.                                                                                                CLEAR LIQUIDS INCLUDE: Water Jello Ice Popsicles Tea (sugar ok, no milk/cream) Powdered fruit flavored drinks Coffee (sugar ok, no milk/cream) Gatorade Juice: apple, white grape, white cranberry  Lemonade Clear bullion, consomm, broth Carbonated beverages (any kind) Strained chicken noodle soup Hard Candy   MEDICATION INSTRUCTIONS  Unless otherwise instructed, you should take regular prescription medications with a small sip of water as early as possible the morning of your procedure.                   OTHER INSTRUCTIONS  You will need a responsible adult at least 59 years of age to accompany you and drive you home.   This person must remain in the waiting room during your procedure.  Wear loose fitting clothing that is easily removed.  Leave jewelry and other valuables at home.  However, you may wish to bring a book to read or an iPod/MP3 player to listen to music as you wait for your procedure to start.  Remove all body piercing jewelry and leave at home.  Total time from sign-in until discharge is approximately 2-3 hours.  You  should go home directly after your procedure and rest.  You can resume normal activities the day after your procedure.  The day of your procedure you should not:   Drive   Make legal decisions   Operate machinery   Drink alcohol   Return to work  You will receive specific instructions about eating, activities and medications before you leave.    The above instructions have been reviewed and explained to me by   Hortense Ramal, CMA    I fully understand and can verbalize these instructions _____________________________ Date 12/15/09

## 2010-12-06 NOTE — Procedures (Signed)
Summary: EGD   EGD  Procedure date:  09/28/2004  Findings:      Location: [facility]   Patient Name: [name], [name]. MRN:  Procedure Procedures: Panendoscopy (EGD) CPT: 43235.  Personnel: Endoscopist: Venita Lick. Russella Dar, MD, Clementeen Graham.  Exam Location: Exam performed in Endoscopy Suite. Inpatient-ward  Patient Consent: Procedure, Alternatives, Risks and Benefits discussed, consent obtained, from patient.  Indications Symptoms: Nausea. Vomiting. Abdominal pain, location: epigastric.  History  Current Medications: Patient is not currently taking Coumadin.  Pre-Exam Physical: Performed Sep 28, 2004  Cardio-pulmonary exam, HEENT exam WNL. Abdominal exam abnormal. Extremity exam, Neurological exam, Mental status exam WNL.  Exam Exam Info: Maximum depth of insertion Duodenum, intended Duodenum. Vocal cords not visualized. Gastric retroflexion performed. ASA Classification: II. Tolerance: excellent.  Sedation Meds: Patient assessed and found to be appropriate for moderate (conscious) sedation. Cetacaine Spray 2 sprays given aerosolized. Versed 10 mg. given IV. Fentanyl 100 mcg. given IV.  Monitoring: BP and pulse monitoring done. Oximetry used. Supplemental O2 given  Findings Normal: Proximal Esophagus to Duodenal 2nd Portion.   Assessment Normal examination.  Events  Unplanned Intervention: No unplanned interventions were required.  Unplanned Events: There were no complications. Plans Medication(s): Continue current medications. PPI: QAM,   Patient Education: Patient given standard instructions for: Reflux.  Disposition: After procedure patient sent to recovery. After recovery patient sent back to hospital.  Scheduling: Office Visit, to Vania Rea. Jarold Motto, MD, prn  Referring provider, to Rene Paci, MD, Sep 28, 2004.    This report was created from the original endoscopy report, which was reviewed and signed by the above listed  endoscopist.

## 2010-12-06 NOTE — Letter (Signed)
Summary: Transrectal [place] & Biopsy of Prostate/ [facility]  Transrectal [place] & Biopsy of Prostate/ [facility]   Imported By: Lennie Odor 11/11/2009 12:12:38  _____________________________________________________________________  External Attachment:    Type:   Image     Comment:   External Document

## 2010-12-06 NOTE — Letter (Signed)
Summary: Patient Notice-Endo Biopsy Results  [facility]  [address] [place], [place] [address]   Phone: [phone]  Fax: [phone]        December 24, 2009 MRN: [id number]    [name] [address] [place], [place]  [address]    Dear [name],  I am pleased to inform you that the biopsies taken during your recent endoscopic examination did not show any evidence of cancer upon pathologic examination.The biopsies from Your stomach show mild inflammation.  Additional information/recommendations:  __No further action is needed at this time.  Please follow-up with      your primary care physician for your other healthcare needs.  __ Please call [phone] to schedule a return visit to review      your condition.  _x_ Continue with the treatment plan as outlined on the day of your      exam.     Please call us if you are having persistent problems or have questions about your condition that have not been fully answered at this time.  Sincerely,  Hart Carwin MD  This letter has been electronically signed by your physician.  Appended Document: Patient Notice-Endo Biopsy Results Letter mailed 2.21.11

## 2010-12-06 NOTE — Letter (Signed)
Summary: [facility]  [facility]   Imported By: Lester [place] 11/15/2009 08:52:24  _____________________________________________________________________  External Attachment:    Type:   Image     Comment:   External Document

## 2010-12-06 NOTE — Miscellaneous (Signed)
Summary: reglan and carafate prescriptions  Clinical Lists Changes  Medications: Added new medication of REGLAN 10 MG  TABS (METOCLOPRAMIDE HCL) by mouth HS - Signed Added new medication of CARAFATE 1 GM  TABS (SUCRALFATE) 1 gm by mouth two times a day - Signed Rx of REGLAN 10 MG  TABS (METOCLOPRAMIDE HCL) by mouth HS;  #30 x 2;  Signed;  Entered by: Laverna Peace RN;  Authorized by: Hart Carwin MD;  Method used: Electronically to [facility] [address], [place], [place], [place]  [address], Ph: [phone], Fax: [phone] Rx of CARAFATE 1 GM  TABS (SUCRALFATE) 1 gm by mouth two times a day;  #60 x 2;  Signed;  Entered by: Laverna Peace RN;  Authorized by: Hart Carwin MD;  Method used: Electronically to [facility]*, [address] [place], [place], [place]  [address], Ph: [phone], Fax: [phone]    Prescriptions: CARAFATE 1 GM  TABS (SUCRALFATE) 1 gm by mouth two times a day  #60 x 2   Entered by:   Laverna Peace RN   Authorized by:   Hart Carwin MD   Signed by:   Laverna Peace RN on 12/21/2009   Method used:   Electronically to        [facility] (retail)       [address]       [place], [place]  [address]       Ph: [phone]       Fax: [phone]   RxID:   [id number] REGLAN 10 MG  TABS (METOCLOPRAMIDE HCL) by mouth HS  #30 x 2   Entered by:   Laverna Peace RN   Authorized by:   Hart Carwin MD   Signed by:   Laverna Peace RN on 12/21/2009   Method used:   Electronically to        [facility] (retail)       [address]       [place], [place]  [address]       Ph: [phone]       Fax: [phone]   RxID:   [id number]   Appended Document: reglan and carafate prescriptions Samples x 3 given to pt per Dr. Juanda Chance request.   Clinical Lists Changes  Medications: Changed medication from NEXIUM 40 MG  CPDR (ESOMEPRAZOLE MAGNESIUM) take 1 by mouth to NEXIUM 40 MG  CPDR (ESOMEPRAZOLE MAGNESIUM) take 1 by  mouth

## 2010-12-06 NOTE — Progress Notes (Signed)
Summary: triage   Phone Note Call from Patient Call back at [phone]   Caller: Spouse Call For: [name] Summary of Call: Patient has severe stomach pains wants to be seen asap Initial call taken by: Tawni Levy,  December 10, 2009 1:02 PM  Follow-up for Phone Call        Pt. wife wants to switch appts. with her husband, so he can see [name] sooner.  Appts have been changed, [name] will see [name] on 12-15-09 at 1:30pm and [name] will see [name] on 01-25-10 at 2:45pm. Pt's instructed to call back as needed.  Follow-up by: Laureen Ochs LPN,  December 10, 2009 2:15 PM

## 2010-12-08 NOTE — Letter (Signed)
Summary: Ihor Gully MD/[facility]  Ihor Gully MD/[facility]   Imported By: Lester [place] 11/22/2010 10:04:05  _____________________________________________________________________  External Attachment:    Type:   Image     Comment:   External Document

## 2010-12-14 NOTE — Consult Note (Signed)
Summary: [facility]  [facility]   Imported By: Sherian Rein 12/05/2010 10:30:58  _____________________________________________________________________  External Attachment:    Type:   Image     Comment:   External Document

## 2011-01-05 ENCOUNTER — Encounter: Payer: Self-pay | Admitting: Endocrinology

## 2011-01-05 HISTORY — PX: BRAIN SURGERY: SHX531

## 2011-01-10 ENCOUNTER — Other Ambulatory Visit: Payer: Self-pay | Admitting: Neurosurgery

## 2011-01-10 DIAGNOSIS — G500 Trigeminal neuralgia: Secondary | ICD-10-CM

## 2011-01-11 ENCOUNTER — Other Ambulatory Visit: Payer: Self-pay | Admitting: Neurosurgery

## 2011-01-11 DIAGNOSIS — Z139 Encounter for screening, unspecified: Secondary | ICD-10-CM

## 2011-01-12 ENCOUNTER — Other Ambulatory Visit: Payer: Self-pay | Admitting: Neurosurgery

## 2011-01-12 ENCOUNTER — Ambulatory Visit
Admission: RE | Admit: 2011-01-12 | Discharge: 2011-01-12 | Payer: Self-pay | Source: Ambulatory Visit | Attending: Neurosurgery | Admitting: Neurosurgery

## 2011-01-12 ENCOUNTER — Ambulatory Visit
Admission: RE | Admit: 2011-01-12 | Discharge: 2011-01-12 | Disposition: A | Discharge status: Home / Self Care | Payer: Medicare Other | Source: Ambulatory Visit | Attending: Neurosurgery | Admitting: Neurosurgery

## 2011-01-12 DIAGNOSIS — G500 Trigeminal neuralgia: Secondary | ICD-10-CM

## 2011-01-12 DIAGNOSIS — Z139 Encounter for screening, unspecified: Secondary | ICD-10-CM

## 2011-01-12 MED ORDER — GADOBENATE DIMEGLUMINE 529 MG/ML IV SOLN
20.0000 mL | Freq: Once | INTRAVENOUS | Status: AC | PRN
  Administered 2011-01-12: 20 mL via INTRAVENOUS

## 2011-01-17 NOTE — Consult Note (Signed)
Summary: Neurology/[facility]  Neurology/[facility]   Imported By: Sherian Rein 01/10/2011 11:47:41  _____________________________________________________________________  External Attachment:    Type:   Image     Comment:   External Document

## 2011-01-17 NOTE — Letter (Signed)
Summary: [facility]rology/[facility]  Neurology/[facility]   Imported By: Lester [place] 01/12/2011 08:09:08  _____________________________________________________________________  External Attachment:    Type:   Image     Comment:   External Document

## 2011-01-31 ENCOUNTER — Other Ambulatory Visit (HOSPITAL_COMMUNITY): Payer: Self-pay | Admitting: Neurosurgery

## 2011-01-31 ENCOUNTER — Ambulatory Visit (HOSPITAL_COMMUNITY)
Admission: RE | Admit: 2011-01-31 | Discharge: 2011-01-31 | Disposition: A | Discharge status: Home / Self Care | Payer: Medicare Other | Source: Ambulatory Visit | Attending: Neurosurgery | Admitting: Neurosurgery

## 2011-01-31 ENCOUNTER — Encounter (HOSPITAL_COMMUNITY)
Admission: RE | Admit: 2011-01-31 | Discharge: 2011-01-31 | Disposition: A | Discharge status: Home / Self Care | Payer: Medicare Other | Source: Ambulatory Visit | Attending: Neurosurgery | Admitting: Neurosurgery

## 2011-01-31 DIAGNOSIS — Z01810 Encounter for preprocedural cardiovascular examination: Secondary | ICD-10-CM | POA: Insufficient documentation

## 2011-01-31 DIAGNOSIS — G500 Trigeminal neuralgia: Secondary | ICD-10-CM | POA: Insufficient documentation

## 2011-01-31 DIAGNOSIS — Z01812 Encounter for preprocedural laboratory examination: Secondary | ICD-10-CM | POA: Insufficient documentation

## 2011-01-31 DIAGNOSIS — I10 Essential (primary) hypertension: Secondary | ICD-10-CM | POA: Insufficient documentation

## 2011-01-31 DIAGNOSIS — Z01818 Encounter for other preprocedural examination: Secondary | ICD-10-CM | POA: Insufficient documentation

## 2011-01-31 LAB — CBC
HCT: 43.6 % (ref 39.0–52.0)
MCH: 29.4 pg (ref 26.0–34.0)
MCHC: 34.6 g/dL (ref 30.0–36.0)
MCV: 84.8 fL (ref 78.0–100.0)
RDW: 13.7 % (ref 11.5–15.5)

## 2011-01-31 LAB — SURGICAL PCR SCREEN
MRSA, PCR: NEGATIVE
Staphylococcus aureus: NEGATIVE

## 2011-01-31 LAB — BASIC METABOLIC PANEL
BUN: 9 mg/dL (ref 6–23)
Calcium: 9.4 mg/dL (ref 8.4–10.5)
Creatinine, Ser: 0.98 mg/dL (ref 0.4–1.5)
GFR calc non Af Amer: >60 mL/min (ref >60)
Glucose, Bld: 91 mg/dL (ref 70–99)

## 2011-01-31 LAB — ABO/RH: ABO/RH(D): A POS

## 2011-02-02 ENCOUNTER — Inpatient Hospital Stay (HOSPITAL_COMMUNITY)
Admission: RE | Admit: 2011-02-02 | Discharge: 2011-02-06 | DRG: 027 | Disposition: A | Discharge status: Home / Self Care | Payer: Medicare Other | Source: Ambulatory Visit | Attending: Neurosurgery | Admitting: Neurosurgery

## 2011-02-02 DIAGNOSIS — K219 Gastro-esophageal reflux disease without esophagitis: Secondary | ICD-10-CM | POA: Diagnosis present

## 2011-02-02 DIAGNOSIS — Z01812 Encounter for preprocedural laboratory examination: Secondary | ICD-10-CM

## 2011-02-02 DIAGNOSIS — I10 Essential (primary) hypertension: Secondary | ICD-10-CM | POA: Diagnosis present

## 2011-02-02 DIAGNOSIS — G500 Trigeminal neuralgia: Principal | ICD-10-CM | POA: Diagnosis present

## 2011-02-03 LAB — BASIC METABOLIC PANEL
BUN: 11 mg/dL (ref 6–23)
Calcium: 8.1 mg/dL — ABNORMAL LOW (ref 8.4–10.5)
Creatinine, Ser: 0.87 mg/dL (ref 0.4–1.5)
GFR calc non Af Amer: >60 mL/min (ref >60)
Potassium: 4.5 mEq/L (ref 3.5–5.1)

## 2011-02-03 LAB — MAGNESIUM: Magnesium: 1.8 mg/dL (ref 1.5–2.5)

## 2011-02-07 NOTE — Op Note (Signed)
[name], [name]          ACCOUNT NO.:  [id number]  MEDICAL RECORD NO.:  [id number]           PATIENT TYPE:  I  LOCATION:  3107                         FACILITY:  [facility]  PHYSICIAN:  Danae Orleans. Venetia Maxon, M.D.  DATE OF BIRTH:  [birth year]  DATE OF PROCEDURE:  02/02/2011 DATE OF DISCHARGE:                              OPERATIVE REPORT   PREOPERATIVE DIAGNOSIS:  Right V2 and V3 trigeminal neuralgia refractory to medical management.  POSTOPERATIVE DIAGNOSIS:  Right V2 and V3 trigeminal neuralgia refractory to medical management.  PROCEDURE:  Right microvascular decompression of cranial nerve with cranioplasty.  SURGEON:  Danae Orleans. Venetia Maxon, MD  ASSISTANT:  Georgiann Cocker, RN and Hilda Lias, MD  ANESTHESIA:  General endotracheal anesthesia.  ESTIMATED BLOOD LOSS:  Minimal.  COMPLICATIONS:  None.  DISPOSITION:  Recovery.  INDICATIONS:  [name] is a 60 year old man with medically refractory right V2 and V3 trigeminal neuralgia.  It was elected to take him to surgery for microvascular decompression.  PROCEDURE:  [name] was brought to the operating room. Following a satisfactory and uncomplicated induction of general endotracheal anesthesia, placement of intravenous lines, Foley catheter, and arterial line, he was placed in the left lateral decubitus position with left axillary roll.  He was placed in three pinhead fixation right postauricular area made most prominent.  He was taped to the table and right shoulder was taped loosely out of the operative field.  His postauricular region was shaved on the right.  Area of planned incision was infiltrated with local lidocaine.  Incision was made in the postauricular region and carried to the suboccipital squama.  Using a 5- mm bur round drill bit, a suboccipital craniectomy was performed exposing the posterior fossa dura, exposure was then carried with Kerrison rongeurs to the sigmoid and transverse sinuses  in the junction of the sinuses.  Several mastoid air cells were injured and these were waxed with copious amounts of bone wax.  The dura was then opened and based on the junction of the sinuses and then was reflected superiorly and anteriorly.  The arachnoid was opened, CSF was drained.  Applebaum retractor was used and the superior petrosal vein was identified and cleared of investing arachnoid, cauterized with bipolar cautery, and cut with micro scissors.  Subsequent dissection was then further performed to expose the trigeminal nerve.  There were some fairly dense arachnoid, which was incised sharply with an arachnoid knife and the nerve was exposed.  There was a loop of artery superiorly, which was grooving the superior aspect of the nerve.  There was an additional artery, which was grooving the inferior aspect of the nerve and there was a leash of arteries beneath the nerve adjacent to the brain stem, which appeared also to be contributing compression of the nerve.  A relatively large piece of Teflon was then cut and slid under the nerve and to keep all of these arteries away from the nerve and it was then folded back on itself to prevent to further decompress the nerves.  Hemostasis was assured. The wound was irrigated.  The self-retaining retractor was removed.  The posterior fossa dura was loosely reapproximated with  4-0 Nurolon stitches.  A piece of Duraform was placed overlying it and then acrylic bone cement cranioplasty was performed to cover the cranial defect.  The muscle was then reapproximated with 2-0 Vicryl sutures.  The fascia reapproximated with 2-0 Vicryl sutures.  Subcutaneous tissues were approximated 2-0 Vicryl sutures and the skin edges were approximated with 3-0 nylon running lock stitch.  Wound was dressed with sterile occlusive dressing.  Three pinhead fixation was removed, one of the pin sites had some bleeding, which was controlled with staples.  The  patient was then returned to supine position on the operating table and extubated in the operating room, taken to the recovery room in a stable and satisfactory condition having tolerated his operation well.  Counts were correct at the end of the case.     Danae Orleans. Venetia Maxon, M.D.     JDS/MEDQ  D:  02/02/2011  T:  02/03/2011  Job:  [id number]  Electronically Signed by Maeola Harman M.D. on 02/07/2011 07:35:05 AM

## 2011-02-28 NOTE — Discharge Summary (Signed)
  [name]          ACCOUNT NO.:  [id number]  MEDICAL RECORD NO.:  [id number]           PATIENT TYPE:  I  LOCATION:  3015                         FACILITY:  [facility]  PHYSICIAN:  Danae Orleans. Venetia Maxon, M.D.  DATE OF BIRTH:  [birth year]  DATE OF ADMISSION:  02/02/2011 DATE OF DISCHARGE:  02/06/2011                              DISCHARGE SUMMARY   REASON FOR ADMISSION:  Right V2 and V3 trigeminal neuralgia.  FINAL DIAGNOSIS:  Right V2 and V3 trigeminal neuralgia.  ADDITIONAL DIAGNOSES: 1. Hypertension. 2. Anxiety. 3. Depression.  PREOPERATIVE MEDICATIONS: 1. Nexium 40 mg daily. 2. Accupril 20 mg daily. 3. Wellbutrin 150 mg daily. 4. Xanax 1 mg daily as needed. 5. MiraLax as needed. 6. Gabapentin 300 mg 3 times daily.  ALLERGIES:  He has had no known drug allergies.  HISTORY OF ILLNESS AND HOSPITAL COURSE:  [name] is a 60 year old disabled man who had a previous abdominal injury now has intractable medically refractory right V2 and V3 distribution trigeminal neuralgia. He is taking oxycodone without relief.  He was seen by Dr. Glenice Bow in [facility] and was put on Carbamazepine 150 mg twice daily which he said made a mistake, then he took Carbamazepine 300 mg twice daily which also made a mistake.  He has used diclofenac, hydrocodone, Trileptal, Tegretol, Flexeril, and all without great relief.  The patient has intractable refractory right V2 and V3 distribution trigeminal neuralgia which is preventing him from eating.  He described it as intractable pain.  It was elected after he failed adequate inappropriate medical therapy for him to undergo right macrovascular decompression for trigeminal neuralgia.  The patient was admitted to the hospital and taken to the surgery on the same day as procedure.  The patient tolerated an uncomplicated suboccipital craniotomy and decompression of the right trigeminal nerve.  The patient postoperatively had good  relief of his pain.  He had some urinary retention which required intermittent Foley placement and then subsequently, the patient was discharged home with urinary retention and was started on Flomax with instructions to follow up with Dr. Vernie Ammons who is his Urologist for removal of the catheter and he was instructed to follow up in my office in 10-14 days for suture removal.  FINAL DIAGNOSES: 1. Right V2 and V3 trigeminal neuralgia. 2. Urinary retention.     Danae Orleans. Venetia Maxon, M.D.     JDS/MEDQ  D:  02/22/2011  T:  02/22/2011  Job:  [id number]  Electronically Signed by Maeola Harman M.D. on 02/28/2011 07:37:18 AM

## 2011-03-24 NOTE — Discharge Summary (Signed)
[facility]  Patient:    [name], [name]                 MRN: [id number] Adm. Date:  [id number] Disc. Date: [id number] Attending:  Duke Salvia CC:         [place]. Daphine Deutscher, M.D.   Discharge Summary  ADMISSION DIAGNOSES: 1. Abdominal pain. 2. Fever. 3. Leukocytosis.  DISCHARGE DIAGNOSES: 1. Prostatitis. 2. Active internal hemorrhoids.  HISTORY OF PRESENT ILLNESS:  The patient was seen in the office by Dr. Romero Belling on the day of admission.  The patient presented complaining of several days of feeling suboptimal in regards to his health.  He had also developed lower abdominal pain, low-grade fever, chills, and was diaphoretic on admission.  He has chronic chest pain and shortness of breath which was unchanged.  See the H&P for past medical history, family history, and social history.  Of note, his history is significant for intra-abdominal infection following Nissen fundoplication with a prolonged hospital stay.  The patient is to have a mesh repair in the future.  ADMISSION MEDICATIONS: 1. Accupril 10 mg daily. 2. Prevacid 30 mg daily. 3. Xanax 1 mg t.i.d. 4. Zoloft 100 mg daily. 5. Desyrel 150 mg q.h.s. 6. Zyrtec 10 mg daily.  PHYSICAL EXAMINATION:  VITAL SIGNS:  Admitting exam per Dr. Everardo All revealed a blood pressure of 90/60, temperature was 99.4.  GENERAL:  The patient did not appear acutely ill.  HEENT, NECK:  There was no adenopathy.  CHEST:  Clear.  ABDOMEN:  Soft, with minimal tenderness across the lower quadrants.  RECTAL:  Quite erythematous around the perirectal area, but no mass or bleeding was noted.  HOSPITAL COURSE:  The patient was admitted to the hospital and started on IV antibiotics with Levaquin 500 mg IV q.d.  The patient was seen in consultation by general surgery to rule out any intra-abdominal process.  During his hospital stay, he had a repeat vascular exam which revealed  significant tenderness of the prostate as well as tenderness just inside the rectum.  He had had an episode of mild hematochezia.  The patient did well during his hospital stay.  He felt improved by the day of discharge on antibiotics.  Working diagnosis to explain his abdominal pain, discomfort, and fever was prostatitis.  He also had internal hemorrhoids.  PROCEDURES:  The patient had a CT scan of the abdomen which was unremarkable.  DISCHARGE EXAMINATION:  The patient is afebrile.  Vital signs were stable. Heart was regular, with no murmurs.  Abdomen was obese, soft, with positive bowel sounds, with decreased tenderness.  Repeat rectal exam was not performed.  DISCHARGE MEDICATIONS: 1. Cipro 500 mg b.i.d. for a total of 14 days. 2. Anusol-HC suppository 2.5% to use b.i.d. p.r.n. rectal pain. 3. The patient will resume all of his home medications. DD:  06/13/00 TD:  06/14/00 Job: [id number]

## 2011-03-24 NOTE — Discharge Summary (Signed)
NAMEMICHAELPAUL, APO          ACCOUNT NO.:  [id number]   MEDICAL RECORD NO.:  [id number]          PATIENT TYPE:  INP   LOCATION:  5017                         FACILITY:  [facility]   PHYSICIAN:  Rene Paci, M.D. LHCDATE OF BIRTH:  Apr 08, 1951   DATE OF ADMISSION:  09/26/2004  DATE OF DISCHARGE:  09/28/2004                                 DISCHARGE SUMMARY   DISCHARGE DIAGNOSES:  1.  Abdominal pain.  2.  Nausea.  3.  Chronic gastroesophageal reflux disease.  4.  Ventral hernia.   BRIEF ADMISSION HISTORY:  Mr. Thorstenson is a 60 year old white male who  developed abdominal pain on the night prior to admission.  This was  associated with bloating.  The patient's pain was sharp and radiated to his  back.  He did have some associated shortness of breath.  He woke up at 2:00  a.m. with persistent symptoms.  He stated he had some chills, but no fever.  He denied any constipation or diarrhea.   PAST MEDICAL HISTORY:  1.  Gastroesophageal reflux disease.  2.  Status post Nissen fundoplication in 1999.  3.  History of peritonitis in 1999 secondary to surgery.  4.  Hypertension.  5.  History of ventral hernia repair with mesh times four to five within the      past five years.  6.  Dyslipidemia.  7.  History of urolithiasis.  8.  Status post cholecystectomy.   HOSPITAL COURSE:  PROBLEM 1.  GI - The patient presented with abdominal  pain.  Patient had a CT of his abdomen and pelvis.  This revealed  postoperative changes with a large interior upper abdominal wall hernia just  inferior to the xyphoid with a 13 cm diameter protrusion of stomach and  small bowel.  The patient was seen in consultation by surgery.  They did not  feel that this was the cause of his abdominal pain.  Therefore, we did ask  for a GI evaluation.  The patient was seen in consultation by Dr. Russella Dar and  the patient underwent an endoscopy on September 28, 2004.  There was no cause  for symptoms noted on his EGD.   They did recommend continuing proton pump  inhibitor for chronic gastroesophageal reflux disease.  GI suspected that  his symptoms were possibly related to his ventral hernia; although, as  noted, surgery did not feel this was true.  It was therefore felt the  patient should follow-up with his surgeon at [facility] for  further evaluation.   PROBLEM 2.  Elevated D-Dimer - CT of the chest was negative for PE.  Lower  extremity venous Dopplers were negative for DVT.  We suspect this was  elevated in response to his acute primary problem.   PROBLEM 3.  ID - The patient was empirically started on Flagyl and Ancef.  However, there was no etiology for infection so his antibiotics were  discontinued.   MEDICATIONS AT DISCHARGE:  He was instructed to resume his home medications  and follow-up with Dr. Everardo All on Friday at 10:00 a.m.      Laur   LC/MEDQ  D:  11/08/2004  T:  11/08/2004  Job:  [id number]   cc:   Gregary Signs A. Everardo All, M.D. [facility]   Malcolm T. Russella Dar, M.D. [facility]

## 2011-03-24 NOTE — H&P (Signed)
[place]  Patient:    [name], [name]                 MRN: [id number] Adm. Date:  [id number] Attending:  Duke Salvia                         History and Physical  REASON FOR ADMISSION:  Abdominal pain.  HISTORY OF PRESENT ILLNESS:  The patient is 60 year old man with several days of pain across the lower abdomen, low grade fever, chills, diaphoresis, headache, nausea and low back pain.  There is no change in his chronic chest pain, shortness of breath, or dry cough.  He also has a minimal amount of bright red blood per rectum recently.  PAST MEDICAL HISTORY: 1. GERD. 2. Hypertension. 3. Allergic rhinitis. 4. Depression. 5. The patient was admitted in a prolonged hospitalization in October and    November of 2000 with an intra-abdominal infection of a mesh that had been    placed for a ventral hernia.  FAMILY HISTORY:  No one else at home is ill.  SOCIAL HISTORY:  The patient is disabled. He is married.  His wife is with him today.  REVIEW OF SYSTEMS:  The patient denies the following:  Weight loss, earache, blurry vision, double vision, syncope, hoarseness, vomiting, dysuria, hematuria, itching and skin rash.  MEDICATIONS:  Nasonex two sprays each side daily.  Accupril 10 mg a day. Prevacid 30 mg a day.  Xanax 1 mg three times a day.  Zoloft 100 mg daily. Desyrel 150 mg q.h.s.  Zyrtec 10 mg daily.  PHYSICAL EXAMINATION:  VITAL SIGNS:  Blood pressure is 90/60, heart rate 90, temperature 99.4, weight 264 pounds.  GENERAL: Appears to be in no distress.  Does not appear acutely ill.  SKIN: No diaphoretic.  NODES:  There is no palpable lymphadenopathy.  HEENT:  Head is atraumatic.  Sclerae nonicteric.  Pharynx is clear.  NECK:  Supple.  CHEST:  There is no goiter.  Chest clear to auscultation.  CARDIOVASCULAR:  No JVD. No edema.  Regular rate and rhythm.  No murmur.  ABDOMEN: Soft. There is minimal tenderness  across both lower quadrants of the abdomen.  Bowel sounds are active.  The abdomen has an easily reducible ventral hernia in place.  RECTAL:  There is slight erythema around the perirectal area but no mass and no bleeding is noted.  EXTREMITIES:  No obvious deformity of the joints.  Dorsalis pedis pulses are intact.  NEUROLOGIC:  Alert, well oriented, moves all four. His gait is normal.  LABORATORY DATA:  Urinalysis is negative.  WBC 15,000.  IMPRESSION: 1. Abdominal pain, fever and elevated WBC raised the possibility of    a    recurrent intra-abdominal infection. 2. Other chronic medical diagnoses as noted above.  PLAN: 1. Admit to [facility]. 2. Consult surgery. 3. Blood cultures. 4. Intravenous antibiotics. 5. CT scan of the abdomen. DD:  06/11/00 TD:  06/12/00 Job: [id number]

## 2011-03-24 NOTE — H&P (Signed)
NAMEMarland Kitchen  MYER, BOHLMAN          ACCOUNT NO.:  [id number]   MEDICAL RECORD NO.:  [id number]          PATIENT TYPE:  INP   LOCATION:  4702                         FACILITY:  [facility]   PHYSICIAN:  Thomos Lemons, D.O. [facility]   DATE OF BIRTH:  1951-05-03   DATE OF ADMISSION:  09/26/2004  DATE OF DISCHARGE:                                HISTORY & PHYSICAL   PRIMARY CARE DOCTOR:  Sean A. Everardo All, M.D. [facility].   CHIEF COMPLAINT:  Abdominal pain, nausea.   HISTORY OF PRESENT ILLNESS:  The patient is a 60 year old white male with  complex medical history including a history of Nissen fundoplication  performed in 1999, complicated by a number of ventral hernias that have had  revisions secondary to mesh infection, four to five times, according to the  patient, who comes in today with acute abdominal pain.  The patient states  that the pain started some times last night, and he describes bloating in  his abdomen and a sharp pain that radiates to his back.  Symptoms are also  associated with shortness of breath since last night, and the patient awoke  at 2:00 a.m. this morning with persistent symptoms.  The patient had some  chills.  Denies any overt fever.  The patient did have a bowel movement this  morning that was within normal limits.  The patient does not describe any  diarrhea, but the patient is very concerned because he has had numerous  issues with a ventral hernia with infected mesh in the past.  He was last  evaluated by Dr. Fredrich Birks at [facility].  The patient also was  followed by Dr. Luretha Murphy at [facility].   PAST MEDICAL HISTORY:  1.  GERD.  2.  Hypertension.  3.  Nissen fundoplication in 1999.  4.  The patient also has a history of peritonitis in 1999 secondary to his      surgery.  5.  The patient has had repair of ventral hernia with mesh x 4-5 within the      past five years.  6.  Dyslipidemia.  7.  History of urolithiasis.  8.  The patient has had a  cholecystectomy in the past.   CURRENT MEDICATIONS:  1.  Nexium 40 mg daily.  2.  Wellbutrin XL 300 mg q.a.m.  3.  Accupril 10 mg daily.  4.  Xanax 1 mg p.o. b.i.d. p.r.n.   REVIEW OF SYSTEMS:  The patient notes some weakness.  No HEENT symptoms.  The patient does have some epigastric/lower sternal pain.  The patient has  shortness of breath, and his O2 saturation in the office was 91% on room  air.  The patient again denied any diarrhea, but has nausea.  Unable to  vomit because of his fundoplication.  The patient denies any symptoms of  dysuria, urinary frequency, or urgency.  The patient was in his usual state  of health prior to this event.  All other systems negative.   SOCIAL HISTORY:  The patient denies any alcohol use.  He quit tobacco in  1999, but he used tobacco x 15 years, one  pack per day.   The patient denies any allergy to medications.   FAMILY HISTORY:  Noncontributory.   PHYSICAL EXAMINATION:  TODAY'S WEIGHT:  273 pounds.  TEMPERATURE:  100.4.  PULSE:  110.  BLOOD PRESSURE: 110/70.  GENERAL:  The patient is a morbidly obese 60 year old white male in mild  distress.  HEENT:  Benign.  Pupils are equal and reactive to light bilaterally.  Oropharynx is within normal limits.  NECK:  No adenopathy or thyromegaly.  RESPIRATORY:  Clear to auscultation bilaterally.  No evidence of rhonchi,  rales, or wheezing.  The patient had no dullness to percussion of his chest.  CARDIOVASCULAR:  Regular rate and rhythm.  The patient was tachycardic.  No  significant murmurs, rubs, or gallops are appreciated.  ABDOMEN:  Protuberant.  Large midline scar.  The patient did have  midepigastric tenderness and also mild right lower quadrant tenderness.  Unable to appreciate any specific hernia.  SKIN:  Warm and dry.  No significant edema in his lower extremities.   ASSESSMENT AND PLAN:  1.  Acute abdominal pain.  2.  History of depression.  3.  Gastroesophageal reflux disease.  4.   Hypertension.   RECOMMENDATIONS:  The patient is a 60 year old with past medical history of  fundoplication and multiple ventral hernia repairs.  We will need to rule  out another infected ventral hernia with infected mesh.  The patient will be  hospitalized.  We will check his blood cultures and start him on empiric  antibiotics, and a CT scan of his abdomen and pelvis will be ordered.  Dr.  Luretha Murphy was notified and will be consulted during his  hospitalization.  The patient's symptoms are not consistent with  obstruction.  The patient did have a bowel movement today.  We will also  need to rule out pancreatitis.  We will check his amylase and lipase and his  LFTs.  We will maintain the patient on his Wellbutrin and will hold his  Accupril, for the patient may have issues with renal insufficiency being on  an ACE inhibitor and volume depleted.      Robe   RY/MEDQ  D:  09/26/2004  T:  09/26/2004  Job:  [id number]   cc:   Gregary Signs A. Everardo All, M.D. [facility]

## 2011-03-24 NOTE — Discharge Summary (Signed)
[place]. [facility]  Patient:    HANLEY, WOERNER                 MRN: [id number] Adm. Date:  [id number] Disc. Date: [id number] Attending:  Katha Cabal                           Discharge Summary  ADMISSION DIAGNOSIS:  Giant ventral hernia.  PROCEDURE:  Ventral hernia repair with dual mesh fenestrated - July 30, 2000.  HOSPITAL COURSE:  Aaidyn San is a 60 year old gentleman who came in as an a.m. admission for ventral hernia repair.  He had a giant ventral hernia repaired with Gore-Tex dual mesh corduroy antimicrobial mesh.  Postoperatively he had a fair amount of pain as expected since his diastasis of his recti was so great.  He, however, got along better, began passing flatus and was started on liquids.  Sutures and staples were left in his wound at the time of his discharge on August 04, 2000.  FINAL DIAGNOSIS:  Large ventral hernia status post repair with Gore-Tex corduroy dual mesh. DD:  08/21/00 TD:  08/21/00 Job: [id number]

## 2011-05-08 ENCOUNTER — Encounter: Payer: Self-pay | Admitting: Endocrinology

## 2011-05-09 ENCOUNTER — Ambulatory Visit: Payer: Medicare Other | Admitting: Internal Medicine

## 2011-05-11 ENCOUNTER — Other Ambulatory Visit (INDEPENDENT_AMBULATORY_CARE_PROVIDER_SITE_OTHER): Payer: Medicare Other

## 2011-05-11 ENCOUNTER — Encounter: Payer: Self-pay | Admitting: Endocrinology

## 2011-05-11 ENCOUNTER — Ambulatory Visit (INDEPENDENT_AMBULATORY_CARE_PROVIDER_SITE_OTHER): Payer: Medicare Other | Admitting: Endocrinology

## 2011-05-11 VITALS — BP 108/70 | HR 84 | Temp 98.2°F | Ht 71.0 in | Wt 259.0 lb

## 2011-05-11 DIAGNOSIS — M791 Myalgia, unspecified site: Secondary | ICD-10-CM

## 2011-05-11 DIAGNOSIS — N390 Urinary tract infection, site not specified: Secondary | ICD-10-CM

## 2011-05-11 DIAGNOSIS — IMO0001 Reserved for inherently not codable concepts without codable children: Secondary | ICD-10-CM

## 2011-05-11 DIAGNOSIS — N3000 Acute cystitis without hematuria: Secondary | ICD-10-CM

## 2011-05-11 LAB — POCT URINALYSIS DIPSTICK
Protein, UA: NEGATIVE
Spec Grav, UA: 1.02
Urobilinogen, UA: 1
pH, UA: 6

## 2011-05-11 MED ORDER — CIPROFLOXACIN HCL 500 MG PO TABS: 500.0000 mg | ORAL_TABLET | Freq: Two times a day (BID) | ORAL | Status: AC

## 2011-05-11 NOTE — Patient Instructions (Addendum)
i have sent a prescription to your pharmacy, for an antibiotic.  See ottlein as scheduled next week. blood tests are being ordered for you today.  please call [phone] to hear your test results.  You will be prompted to enter the 9-digit "MRN" number that appears at the top left of this page, followed by #.  Then you will hear the message. Let's check a test of the muscles.  you will be called with a day and time for an appointment.

## 2011-05-11 NOTE — Progress Notes (Signed)
Subjective:    Patient ID: [name], male    DOB: 11/01/1951, 60 y.o.   MRN: [id number]  HPI Pt underwent surgery for trigeminal neuralgia, 3 mos ago.  He required foley cath replacement then.  He has been on flomax since then.  He now has few days of dysuria at the urethra, but no assoc gross hematuria.   Pt states a few years of moderate myalgias throughout the body.  There is assoc numbness of the skin x 3 mos, but he attributes this to his recent surgery.  He has seen several rheumatologists. Past Medical History  Diagnosis Date  . Anxiety   . Depression   . GERD (gastroesophageal reflux disease)   . HTN (hypertension)   . Lung disease   . IBS (irritable bowel syndrome)   . Dyslipidemia   . Anemia, pernicious   . ACE-inhibitor cough   . Nephrolithiasis   . Allergic rhinitis   . Diverticulosis of colon   . Elevated PSA     Past Surgical History  Procedure Date  . Hand surgery 1973    Left  . Knee surgery 1997    Left  . Cholecystectomy 1981  . Abdominal surgery     Multiple  . Ventral hernia repair   . Nissen fundoplication 1999     complicated by gastric perforation, geritonitis, ventral nernia   . Lithotripsy     Right    History   Social History  . Marital Status: Married    Spouse Name: N/A    Number of Children: 2  . Years of Education: N/A   Occupational History  . Disabled - psych    Social History Main Topics  . Smoking status: Former Games developer  . Smokeless tobacco: Not on file  . Alcohol Use: No  . Drug Use: Not on file  . Sexually Active: Not on file   Other Topics Concern  . Not on file   Social History Narrative   Daily Caffeine Use:  Yes    Current Outpatient Prescriptions on File Prior to Visit  Medication Sig Dispense Refill  . ALPRAZolam (XANAX) 1 MG tablet Take 1 mg by mouth every morning.        Marland Kitchen buPROPion (WELLBUTRIN XL) 300 MG 24 hr tablet Take 300 mg by mouth daily.        Marland Kitchen esomeprazole (NEXIUM) 40 MG capsule Take  40 mg by mouth daily.        Marland Kitchen omeprazole (PRILOSEC) 20 MG capsule Take 20 mg by mouth at bedtime.        . polyethylene glycol powder (GLYCOLAX/MIRALAX) powder Take 17 g by mouth daily.        . quinapril (ACCUPRIL) 20 MG tablet Take 20 mg by mouth daily.        . sucralfate (CARAFATE) 1 GM/10ML suspension Take 1 g by mouth as needed.          Allergies  Allergen Reactions  . Ace Inhibitors     REACTION: cough    Family History  Problem Relation Age of Onset  . Diabetes Mother   . Coronary artery disease Mother     CABG  . Diabetes Sister   . Diabetes Brother   . Hypertension Brother   . Colon cancer Neg Hx     BP 108/70  Pulse 84  Temp(Src) 98.2 F (36.8 C) (Oral)  Ht 5\' 11"  (1.803 m)  Wt 259 lb (117.482 kg)  BMI 36.12 kg/m2  SpO2 97%    Review of Systems Denies fever and rash.    Objective:   Physical Exam GENERAL: no distress. Msk:  No muscle tenderness.  Gait: normal and steady.     Assessment & Plan:  Uti, new Fibromyalgia, persistent

## 2011-05-12 LAB — CK: Total CK: 99 U/L (ref 7–232)

## 2011-05-24 ENCOUNTER — Telehealth: Payer: Self-pay | Admitting: *Deleted

## 2011-05-24 ENCOUNTER — Ambulatory Visit (INDEPENDENT_AMBULATORY_CARE_PROVIDER_SITE_OTHER): Payer: Medicare Other | Admitting: Surgery

## 2011-05-24 ENCOUNTER — Encounter (INDEPENDENT_AMBULATORY_CARE_PROVIDER_SITE_OTHER): Payer: Self-pay | Admitting: General Surgery

## 2011-05-24 ENCOUNTER — Encounter (INDEPENDENT_AMBULATORY_CARE_PROVIDER_SITE_OTHER): Payer: Self-pay | Admitting: Surgery

## 2011-05-24 DIAGNOSIS — R079 Chest pain, unspecified: Secondary | ICD-10-CM

## 2011-05-24 NOTE — Telephone Encounter (Signed)
Caller requesting Lab results for patient.

## 2011-05-24 NOTE — Progress Notes (Signed)
[name] comes in today having had a rough year with trigeminal neuralgia. Currently he is having some lower chest and back pain that may be reminiscent of his gastroesophageal reflux disease. He said that he has been scoped by Dr. Juanda Chance and he has had an upper GI on the last year. I want review that and discuss his case with Dr. Juanda Chance.  If he does have a hiatal hernia he may be that an endoscopic approach would be the best first step for him. Because of his abdominal wall hernia and his previous surgery there is no laparoscopic access to be had.  I plan to review his x-rays and speak with Dr. Kari Baars. determine plan from that. Have asked him to try omeprazole and Tums as he may be getting resistant to Nexium.

## 2011-05-24 NOTE — Telephone Encounter (Signed)
Urine had infection, but blood tests were normal

## 2011-05-24 NOTE — Telephone Encounter (Signed)
Pt's spouse informed of lab results.

## 2011-06-02 ENCOUNTER — Ambulatory Visit: Payer: Medicare Other | Admitting: Endocrinology

## 2011-06-05 ENCOUNTER — Telehealth: Payer: Self-pay

## 2011-06-05 NOTE — Progress Notes (Signed)
Could You, please set up an appointment to see the pt? Thanx. He saw Dr Daphine Deutscher recently and I got Dr Ermalene Searing note.

## 2011-06-05 NOTE — Telephone Encounter (Signed)
Pt scheduled to see Dr. Juanda Chance per her request 06/12/11@9 :45am. Pt aware of appt date and time.

## 2011-06-12 ENCOUNTER — Encounter: Payer: Self-pay | Admitting: Internal Medicine

## 2011-06-12 ENCOUNTER — Ambulatory Visit (INDEPENDENT_AMBULATORY_CARE_PROVIDER_SITE_OTHER): Payer: Medicare Other | Admitting: Internal Medicine

## 2011-06-12 DIAGNOSIS — R1013 Epigastric pain: Secondary | ICD-10-CM

## 2011-06-12 DIAGNOSIS — K219 Gastro-esophageal reflux disease without esophagitis: Secondary | ICD-10-CM

## 2011-06-12 DIAGNOSIS — R933 Abnormal findings on diagnostic imaging of other parts of digestive tract: Secondary | ICD-10-CM

## 2011-06-12 NOTE — Progress Notes (Signed)
[name] 03/30/1951 MRN [id number]    History of Present Illness:  This is a 60 year old white male an exacerbation of chest and epigastric pain. He has a complicated medical history. He is status post gastric perforation during an attempted Nissen fundoplication in 1999. He is status post partial re-wrap and oversewing of the perforation by Dr. Daphine Deutscher in 1999. Patient had a subsequent ventral hernia followed by a history or small bowel obstruction in 2008. His last small bowel follow through in 2008 showed no obstruction. An upper endoscopy in February 2011 showed a 2-3 cm hiatal hernia with partial Nissen and irregular Z line. There was no evidence of Barrett's. A barium esophagram in February 2011 showed normal peristalsis with mild concentric narrowing of the stomach due to incomplete unraveling of the Nissen at the junction of the proximal and mid third of the stomach. There was spontaneous reflux to the level of clavicle with slow clearing of the barium. A barium tablet passed without difficulty. He did well on Nexium and Carafate until recently when he developed trigeminal neuralgia for which he underwent a surgical procedure. His symptoms of chest pain were exacerbated due to multiple pain medications.  Past Medical History  Diagnosis Date  . Anxiety   . Depression   . GERD (gastroesophageal reflux disease)   . HTN (hypertension)   . Lung disease   . IBS (irritable bowel syndrome)   . Dyslipidemia   . Anemia, pernicious   . ACE-inhibitor cough   . Nephrolithiasis   . Allergic rhinitis   . Diverticulosis of colon   . Elevated PSA   . Hernia   . Peritonitis   . Hyperplastic colon polyp   . Rectal fissure    Past Surgical History  Procedure Date  . Hand surgery 1973    Left  . Knee surgery 1997    Left  . Cholecystectomy 1981  . Abdominal surgery     Multiple  . Ventral hernia repair   . Nissen fundoplication 1999     complicated by gastric perforation,  geritonitis, ventral nernia   . Lithotripsy     Right  . Brain surgery March 2012    Trigeminal nerve    reports that he quit smoking about 14 years ago. He has never used smokeless tobacco. He reports that he does not drink alcohol or use illicit drugs. family history includes Coronary artery disease in his mother; Diabetes in his brother, mother, and sister; and Hypertension in his brother.  There is no history of Colon cancer. Allergies  Allergen Reactions  . Ace Inhibitors     REACTION: cough        Review of Systems: Denies dysphagia, positive for chest pain denies shortness of breath. Denies change in bowel habits  The remainder of the 10  point ROS is negative except as outlined in H&P   Physical Exam: General appearance  Well developed, in no distress. Eyes- non icteric. HEENT nontraumatic, normocephalic, normal voice, no cough. Mouth no lesions, tongue papillated, no cheilosis. Neck supple without adenopathy, thyroid not enlarged, no carotid bruits, no JVD. Lungs Clear to auscultation bilaterally, no wheezes. Cor normal S1 normal S2, regular rhythm , no murmur,  quiet precordium. Abdomen large ventral hernia. Very tender area in the epigastrium, no abnormal pulsations or mass. Lower abdomen minimally tender. Rectal: Not done. Extremities no pedal edema. Skin no lesions. Neurological alert and oriented x 3. Psychological normal mood and affect.  Assessment and Plan:  Problem #1 Exacerbation  of chest and abdominal discomfort related to partial unraveling of Nissen fundoplication. He has a recurrent hiatal hernia superimposed by adhesive disease causing partial small bowel obstruction in the past. We need to rule out gastric emptying delay. I have discussed the patient with Dr. Daphine Deutscher. He suggests possible endoscopic placement of a device at the area of the GE junction to improve his lower esophageal sphincter. We agreed to repeat the barium esophagram this time to see if  there is any progression compared to February 2011. I will also consider a gastric emptying scan to address the narrowing in the mid third of the stomach causing a delay in gastric emptying. Patient is to continue on Nexium 40 mg in the morning and Prilosec 20 mg at night as well as carafate 10 cc by mouth twice a day.   06/12/2011 [name]

## 2011-06-12 NOTE — Patient Instructions (Addendum)
You have been scheduled for a barium swallow at [facility] (1st floor of hospital) on Thursday 06/15/11 at 10:00 am. Please arrive 15 minutes prior to your appointment for registration. Make certain not to have anything to eat or drink 4 hours prior to your appointment. Should you need to reschedule your appointment, please contact radiology at [phone]. CC: Dr Morton Stall

## 2011-06-14 ENCOUNTER — Ambulatory Visit (INDEPENDENT_AMBULATORY_CARE_PROVIDER_SITE_OTHER): Payer: Self-pay | Admitting: Surgery

## 2011-06-15 ENCOUNTER — Ambulatory Visit (HOSPITAL_COMMUNITY)
Admission: RE | Admit: 2011-06-15 | Discharge: 2011-06-15 | Disposition: A | Discharge status: Home / Self Care | Payer: Medicare Other | Source: Ambulatory Visit | Attending: Internal Medicine | Admitting: Internal Medicine

## 2011-06-15 DIAGNOSIS — R933 Abnormal findings on diagnostic imaging of other parts of digestive tract: Secondary | ICD-10-CM

## 2011-06-15 DIAGNOSIS — K449 Diaphragmatic hernia without obstruction or gangrene: Secondary | ICD-10-CM | POA: Insufficient documentation

## 2011-06-15 DIAGNOSIS — R079 Chest pain, unspecified: Secondary | ICD-10-CM | POA: Insufficient documentation

## 2011-06-15 DIAGNOSIS — K225 Diverticulum of esophagus, acquired: Secondary | ICD-10-CM | POA: Insufficient documentation

## 2011-06-15 DIAGNOSIS — K219 Gastro-esophageal reflux disease without esophagitis: Secondary | ICD-10-CM | POA: Insufficient documentation

## 2011-06-16 ENCOUNTER — Telehealth: Payer: Self-pay | Admitting: *Deleted

## 2011-06-16 NOTE — Telephone Encounter (Signed)
Message copied by Daphine Deutscher on Fri Jun 16, 2011 10:33 AM ------      Message from: Hart Carwin      Created: Thu Jun 15, 2011 10:46 PM       Please call pt, increased acid reflux, please schedule EGD direct to r/o Barrett's

## 2011-06-16 NOTE — Telephone Encounter (Signed)
Left a message for patient to call me. 

## 2011-06-16 NOTE — Telephone Encounter (Signed)
Spoke with patient and gave him results. Scheduled pre visit on 06/19/11 at 3:30 PM, direct EGD on 06/23/11 at 8:30 AM.

## 2011-06-19 ENCOUNTER — Ambulatory Visit (AMBULATORY_SURGERY_CENTER): Payer: Medicare Other | Admitting: *Deleted

## 2011-06-19 DIAGNOSIS — R944 Abnormal results of kidney function studies: Secondary | ICD-10-CM

## 2011-06-19 DIAGNOSIS — R1013 Epigastric pain: Secondary | ICD-10-CM

## 2011-06-19 DIAGNOSIS — K219 Gastro-esophageal reflux disease without esophagitis: Secondary | ICD-10-CM

## 2011-06-23 ENCOUNTER — Ambulatory Visit (AMBULATORY_SURGERY_CENTER): Payer: Medicare Other | Admitting: Internal Medicine

## 2011-06-23 ENCOUNTER — Other Ambulatory Visit: Payer: Self-pay | Admitting: *Deleted

## 2011-06-23 ENCOUNTER — Encounter: Payer: Self-pay | Admitting: Internal Medicine

## 2011-06-23 VITALS — BP 132/72 | HR 63 | Temp 96.9°F | Resp 12 | Ht 71.0 in | Wt 256.0 lb

## 2011-06-23 DIAGNOSIS — R079 Chest pain, unspecified: Secondary | ICD-10-CM

## 2011-06-23 DIAGNOSIS — K2961 Other gastritis with bleeding: Secondary | ICD-10-CM

## 2011-06-23 DIAGNOSIS — IMO0001 Reserved for inherently not codable concepts without codable children: Secondary | ICD-10-CM

## 2011-06-23 DIAGNOSIS — D131 Benign neoplasm of stomach: Secondary | ICD-10-CM

## 2011-06-23 DIAGNOSIS — K219 Gastro-esophageal reflux disease without esophagitis: Secondary | ICD-10-CM

## 2011-06-23 MED ORDER — OMEPRAZOLE 20 MG PO CPDR: 20.0000 mg | DELAYED_RELEASE_CAPSULE | Freq: Two times a day (BID) | ORAL | Status: DC

## 2011-06-23 MED ORDER — SODIUM CHLORIDE 0.9 % IV SOLN: 500.0000 mL | INTRAVENOUS | Status: DC

## 2011-06-23 NOTE — Telephone Encounter (Signed)
Per Dr Juanda Chance, she would like patient to have omeprazole BID instead of once daily dosing. Rx sent.

## 2011-06-23 NOTE — Patient Instructions (Signed)
Please review discharge instructions (blue and green sheets)

## 2011-06-26 ENCOUNTER — Telehealth: Payer: Self-pay | Admitting: *Deleted

## 2011-06-26 NOTE — Telephone Encounter (Signed)

## 2011-06-28 ENCOUNTER — Encounter: Payer: Self-pay | Admitting: Internal Medicine

## 2011-07-03 ENCOUNTER — Telehealth (INDEPENDENT_AMBULATORY_CARE_PROVIDER_SITE_OTHER): Payer: Self-pay | Admitting: General Surgery

## 2011-07-06 ENCOUNTER — Ambulatory Visit (INDEPENDENT_AMBULATORY_CARE_PROVIDER_SITE_OTHER)
Admission: RE | Admit: 2011-07-06 | Discharge: 2011-07-06 | Disposition: A | Discharge status: Home / Self Care | Payer: Medicare Other | Source: Ambulatory Visit | Attending: Endocrinology | Admitting: Endocrinology

## 2011-07-06 ENCOUNTER — Encounter: Payer: Self-pay | Admitting: Endocrinology

## 2011-07-06 ENCOUNTER — Ambulatory Visit (INDEPENDENT_AMBULATORY_CARE_PROVIDER_SITE_OTHER): Payer: Medicare Other | Admitting: Endocrinology

## 2011-07-06 ENCOUNTER — Other Ambulatory Visit (INDEPENDENT_AMBULATORY_CARE_PROVIDER_SITE_OTHER): Payer: Medicare Other

## 2011-07-06 DIAGNOSIS — R0602 Shortness of breath: Secondary | ICD-10-CM

## 2011-07-06 DIAGNOSIS — I10 Essential (primary) hypertension: Secondary | ICD-10-CM

## 2011-07-06 DIAGNOSIS — F329 Major depressive disorder, single episode, unspecified: Secondary | ICD-10-CM

## 2011-07-06 DIAGNOSIS — R0609 Other forms of dyspnea: Secondary | ICD-10-CM

## 2011-07-06 DIAGNOSIS — R5383 Other fatigue: Secondary | ICD-10-CM

## 2011-07-06 DIAGNOSIS — R0600 Dyspnea, unspecified: Secondary | ICD-10-CM

## 2011-07-06 DIAGNOSIS — R0989 Other specified symptoms and signs involving the circulatory and respiratory systems: Secondary | ICD-10-CM

## 2011-07-06 DIAGNOSIS — R5381 Other malaise: Secondary | ICD-10-CM

## 2011-07-06 DIAGNOSIS — F3289 Other specified depressive episodes: Secondary | ICD-10-CM

## 2011-07-06 DIAGNOSIS — Z79899 Other long term (current) drug therapy: Secondary | ICD-10-CM

## 2011-07-06 LAB — CBC WITH DIFFERENTIAL/PLATELET
Basophils Absolute: 0 10*3/uL (ref 0.0–0.1)
Basophils Relative: 0.6 % (ref 0.0–3.0)
Eosinophils Absolute: 0.1 10*3/uL (ref 0.0–0.7)
HCT: 48.2 % (ref 39.0–52.0)
Hemoglobin: 15.9 g/dL (ref 13.0–17.0)
Lymphocytes Relative: 33.2 % (ref 12.0–46.0)
Lymphs Abs: 2.5 10*3/uL (ref 0.7–4.0)
MCHC: 33 g/dL (ref 30.0–36.0)
MCV: 86.1 fl (ref 78.0–100.0)
Monocytes Absolute: 0.6 10*3/uL (ref 0.1–1.0)
Neutro Abs: 4.4 10*3/uL (ref 1.4–7.7)
RBC: 5.59 Mil/uL (ref 4.22–5.81)
RDW: 14.5 % (ref 11.5–14.6)

## 2011-07-06 LAB — VITAMIN B12: Vitamin B-12: 1001 pg/mL — ABNORMAL HIGH (ref 211–911)

## 2011-07-06 LAB — BASIC METABOLIC PANEL
CO2: 26 mEq/L (ref 19–32)
Chloride: 104 mEq/L (ref 96–112)
Glucose, Bld: 86 mg/dL (ref 70–99)
Potassium: 4 mEq/L (ref 3.5–5.1)
Sodium: 140 mEq/L (ref 135–145)

## 2011-07-06 NOTE — Progress Notes (Signed)
Subjective:    Patient ID: [name], male    DOB: 07-23-1951, 60 y.o.   MRN: [id number]  HPI Pt states 3-4 weeks of doe, and moderate sensation of fullness at the site of his abdominal hernia.  He has assoc fatigue. Past Medical History  Diagnosis Date  . Anxiety   . Depression   . GERD (gastroesophageal reflux disease)   . HTN (hypertension)   . Lung disease     peritonitis after surgery in 1999  . IBS (irritable bowel syndrome)   . Dyslipidemia   . Anemia, pernicious   . ACE-inhibitor cough   . Nephrolithiasis   . Allergic rhinitis   . Diverticulosis of colon   . Elevated PSA   . Hernia   . Peritonitis   . Hyperplastic colon polyp   . Rectal fissure     Past Surgical History  Procedure Date  . Hand surgery 1973    Left  . Knee surgery 1997    Left  . Cholecystectomy 1981  . Abdominal surgery     Multiple  . Ventral hernia repair   . Nissen fundoplication 1999     complicated by gastric perforation, geritonitis, ventral nernia   . Lithotripsy     Right  . Brain surgery March 2012    Trigeminal nerve    History   Social History  . Marital Status: Married    Spouse Name: N/A    Number of Children: 2  . Years of Education: N/A   Occupational History  . Disabled - psych    Social History Main Topics  . Smoking status: Former Smoker    Quit date: 11/06/1996  . Smokeless tobacco: Never Used  . Alcohol Use: No  . Drug Use: No  . Sexually Active: Not on file   Other Topics Concern  . Not on file   Social History Narrative   Daily Caffeine Use:  Yes    Current Outpatient Prescriptions on File Prior to Visit  Medication Sig Dispense Refill  . ALPRAZolam (XANAX) 1 MG tablet Take 1 mg by mouth at bedtime.       Marland Kitchen buPROPion (WELLBUTRIN XL) 300 MG 24 hr tablet Take 300 mg by mouth daily.        . calcium carbonate (TUMS - DOSED IN MG ELEMENTAL CALCIUM) 500 MG chewable tablet Chew 4 tablets by mouth daily.        Marland Kitchen omeprazole (PRILOSEC) 20 MG  capsule Take 1 capsule (20 mg total) by mouth 2 (two) times daily.  60 capsule  3  . polyethylene glycol powder (GLYCOLAX/MIRALAX) powder Take 17 g by mouth daily.        . quinapril (ACCUPRIL) 20 MG tablet Take 20 mg by mouth daily.        . sucralfate (CARAFATE) 1 GM/10ML suspension Take 1 g by mouth as needed.          Allergies  Allergen Reactions  . Ace Inhibitors     REACTION: cough    Family History  Problem Relation Age of Onset  . Diabetes Mother   . Coronary artery disease Mother     CABG  . Diabetes Sister   . Diabetes Brother   . Hypertension Brother   . Colon cancer Neg Hx   . Esophageal cancer Neg Hx   . Stomach cancer Neg Hx     BP 124/82  Pulse 58  Temp(Src) 97.8 F (36.6 C) (Oral)  Ht 6\' 2"  (1.88 m)  Wt 258 lb 3.2 oz (117.119 kg)  BMI 33.15 kg/m2  SpO2 95%    Review of Systems Denies fever and cough     Objective:   Physical Exam VITAL SIGNS:  See vs page GENERAL: no distress.  Obese LUNGS:  Clear to auscultation HEART: Regular rate and rhythm without murmurs noted. Normal S1,S2.   ABDOMEN: abdomen is soft, nontender.  no hepatosplenomegaly.   not distended.  There is a large self-reducing ventral hernia, and several old healed surgical scars.   (i reviewed spirometry and ecg) Lab Results  Component Value Date   WBC 7.6 07/06/2011   HGB 15.9 07/06/2011   HCT 48.2 07/06/2011   MCV 86.1 07/06/2011   PLT 170.0 07/06/2011      Assessment & Plan:  Restrictive lung dz, new, uncertain etiology Abdominal hernia, unchanged.  Uncertain how or if this is related to dyspnea

## 2011-07-06 NOTE — Patient Instructions (Addendum)
A chest-x-ray and blood tests are being requested for you today.  please call [phone] to hear your test results.  You will be prompted to enter the 9-digit "MRN" number that appears at the top left of this page, followed by #.  Then you will hear the message. Refer back to dr Delford Field.  you will receive a phone call, about a day and time for an appointment. (update: i left message on phone-tree:  rx as we discussed).

## 2011-07-21 ENCOUNTER — Ambulatory Visit: Payer: Medicare Other | Admitting: Critical Care Medicine

## 2011-07-26 ENCOUNTER — Encounter: Payer: Self-pay | Admitting: Critical Care Medicine

## 2011-07-26 ENCOUNTER — Ambulatory Visit (INDEPENDENT_AMBULATORY_CARE_PROVIDER_SITE_OTHER): Payer: Medicare Other | Admitting: Critical Care Medicine

## 2011-07-26 VITALS — BP 120/80 | HR 62 | Temp 97.7°F | Ht 71.0 in | Wt 262.8 lb

## 2011-07-26 DIAGNOSIS — J984 Other disorders of lung: Secondary | ICD-10-CM

## 2011-07-26 DIAGNOSIS — R0602 Shortness of breath: Secondary | ICD-10-CM

## 2011-07-26 DIAGNOSIS — N400 Enlarged prostate without lower urinary tract symptoms: Secondary | ICD-10-CM

## 2011-07-26 NOTE — Progress Notes (Signed)
Subjective:    Patient ID: [name], male    DOB: [birth year], 60 y.o.   MRN: [id number]  HPI Comments: 1999 pt had complications from abdominal surgery, was on vent with Sepsis and ARDS  , trach and MSOF.   Saw [facility] and had a giant hernia ? If diaphragmatic issue.  Now dyspnea is worse. Nissen fundoplication has loosened, hernia is sliding , no regurgitation but has chest pain into the back. No real cough  Shortness of Breath This is a new problem. The current episode started more than 1 month ago. The problem occurs daily (worse after eating, after walking). The problem has been gradually worsening. Associated symptoms include chest pain. Pertinent negatives include no abdominal pain, ear pain, fever, headaches, hemoptysis, leg pain, leg swelling, neck pain, orthopnea, PND, rash, rhinorrhea, sore throat, sputum production, vomiting or wheezing. The symptoms are aggravated by any activity and eating. His past medical history is significant for DVT. There is no history of allergies, asthma, bronchiolitis, CAD, chronic lung disease, COPD, a heart failure, PE or pneumonia.   60 y.o. WM    Review of Systems  Constitutional: Positive for fatigue. Negative for fever, chills, diaphoresis, activity change, appetite change and unexpected weight change.  HENT: Positive for dental problem and tinnitus. Negative for hearing loss, ear pain, nosebleeds, congestion, sore throat, facial swelling, rhinorrhea, sneezing, mouth sores, trouble swallowing, neck pain, neck stiffness, voice change, postnasal drip, sinus pressure and ear discharge.   Eyes: Negative for photophobia, discharge, itching and visual disturbance.  Respiratory: Positive for shortness of breath. Negative for apnea, cough, hemoptysis, sputum production, choking, chest tightness, wheezing and stridor.   Cardiovascular: Positive for chest pain. Negative for palpitations, orthopnea, leg swelling and PND.  Gastrointestinal: Negative  for nausea, vomiting, abdominal pain, constipation, blood in stool and abdominal distention.  Genitourinary: Negative for dysuria, urgency, frequency, hematuria, flank pain, decreased urine volume and difficulty urinating.  Musculoskeletal: Positive for myalgias and arthralgias. Negative for back pain, joint swelling and gait problem.  Skin: Negative for color change, pallor and rash.  Neurological: Positive for weakness. Negative for dizziness, tremors, seizures, syncope, speech difficulty, light-headedness, numbness and headaches.  Hematological: Negative for adenopathy. Does not bruise/bleed easily.  Psychiatric/Behavioral: Negative for confusion, sleep disturbance and agitation. The patient is nervous/anxious.        Objective:   Physical Exam  Filed Vitals:   07/26/11 0936  BP: 120/80  Pulse: 62  Temp: 97.7 F (36.5 C)  TempSrc: Oral  Height: 5\' 11"  (1.803 m)  Weight: 262 lb 12.8 oz (119.205 kg)  SpO2: 95%    Gen: Pleasant, obese , in no distress,  normal affect  ENT: No lesions,  mouth clear,  oropharynx clear, no postnasal drip  Neck: No JVD, no TMG, no carotid bruits  Lungs: No use of accessory muscles, no dullness to percussion, clear without rales or rhonchi  Cardiovascular: RRR, heart sounds normal, no murmur or gallops, no peripheral edema  Abdomen: soft and NT, no HSM,  BS normal, scar on abdomen ,   Musculoskeletal: No deformities, no cyanosis or clubbing  Neuro: alert, non focal  Skin: Warm, no lesions or rashes  Cxr: atx at bases, scar RLL, low lung volumes     Assessment & Plan:   DYSPNEA Dyspnea on basis of restrictive lung disease d/t basilar scar from ARDS d/t prior peritonitis 1999, diaphragm elevation from prior hernia surgery complications, no evidence of obstruction on prior spiro Plan Full pfts No medication changes ONO  and 6 min walk to assess oxygenation    Updated Medication List Outpatient Encounter Prescriptions as of 07/26/2011    Medication Sig Dispense Refill  . ALPRAZolam (XANAX) 1 MG tablet Take 1 mg by mouth at bedtime.       Marland Kitchen buPROPion (WELLBUTRIN XL) 300 MG 24 hr tablet Take 300 mg by mouth daily.        . calcium carbonate (TUMS - DOSED IN MG ELEMENTAL CALCIUM) 500 MG chewable tablet Chew 4 tablets by mouth daily as needed.       . Cyanocobalamin (VITAMIN B-12) 5000 MCG SUBL Place under the tongue daily.        . ferrous sulfate 325 (65 FE) MG tablet Take 325 mg by mouth daily with breakfast.        . multivitamin (THERAGRAN) per tablet Take 1 tablet by mouth daily.        Marland Kitchen NEXIUM 40 MG capsule Take 40 mg by mouth daily.       . Omega-3 Fatty Acids (FISH OIL PO) Take 1 capsule by mouth daily.        Marland Kitchen omeprazole (PRILOSEC) 20 MG capsule Take 1 capsule (20 mg total) by mouth 2 (two) times daily.  60 capsule  3  . polyethylene glycol powder (GLYCOLAX/MIRALAX) powder Take 17 g by mouth daily.        . quinapril (ACCUPRIL) 20 MG tablet Take 20 mg by mouth daily.        . sucralfate (CARAFATE) 1 GM/10ML suspension Take 1 g by mouth as needed.        . vitamin C (ASCORBIC ACID) 500 MG tablet Take 500 mg by mouth daily.        . Tamsulosin HCl (FLOMAX) 0.4 MG CAPS Take 0.4 mg by mouth daily.

## 2011-07-26 NOTE — Assessment & Plan Note (Signed)
Dyspnea on basis of restrictive lung disease d/t basilar scar from ARDS d/t prior peritonitis 1999, diaphragm elevation from prior hernia surgery complications, no evidence of obstruction on prior spiro Plan Full pfts No medication changes ONO and 6 min walk to assess oxygenation

## 2011-07-26 NOTE — Patient Instructions (Signed)
Pulmonary functions and overnight sleep oximetry and walk will be obtained Return after above studies in one month

## 2011-07-27 ENCOUNTER — Encounter (INDEPENDENT_AMBULATORY_CARE_PROVIDER_SITE_OTHER): Payer: Self-pay | Admitting: Surgery

## 2011-07-27 ENCOUNTER — Ambulatory Visit (INDEPENDENT_AMBULATORY_CARE_PROVIDER_SITE_OTHER): Payer: Medicare Other | Admitting: Surgery

## 2011-07-27 VITALS — BP 136/98 | HR 60 | Temp 96.7°F | Resp 20 | Ht 71.0 in | Wt 258.4 lb

## 2011-07-27 DIAGNOSIS — K219 Gastro-esophageal reflux disease without esophagitis: Secondary | ICD-10-CM

## 2011-07-27 NOTE — Progress Notes (Signed)
[name] came in today to discuss Dr. Regino Schultze findings.  He has been taking carafate, Nexium, and Prilosec and since he started these his chest pain is gone.  He can live with his symptoms.    Under the circumstances, the risk of an open repair of his hiatus and redo Nissen I think is greater than the risk of managing him medically. I told him I would be glad to see him back in 6 weeks and we will see how he continues to do on maximal medical therapy.  Plan return in 6 weeks

## 2011-08-03 ENCOUNTER — Other Ambulatory Visit: Payer: Self-pay | Admitting: *Deleted

## 2011-08-03 ENCOUNTER — Telehealth: Payer: Self-pay | Admitting: Critical Care Medicine

## 2011-08-03 DIAGNOSIS — R0602 Shortness of breath: Secondary | ICD-10-CM

## 2011-08-03 MED ORDER — POLYETHYLENE GLYCOL 3350 17 GM/SCOOP PO POWD: 17.0000 g | Freq: Every day | ORAL | Status: DC

## 2011-08-03 NOTE — Telephone Encounter (Signed)
R'cd fax from [facility] for refill of Miralax  Last OV-07/06/2011  Last filled-05/31/2011

## 2011-08-03 NOTE — Telephone Encounter (Signed)
Let pt know his ONO in my view was normal, no oxygen needed at night

## 2011-08-03 NOTE — Telephone Encounter (Signed)
Called, spoke with pt's wife, [name].  She is requesting to take results for pt.  Informed [facility] ONO in PW's view was normal, no o2 needed at night.  [name] verbalized understanding of this and voiced no further questions at this time.  She will inform pt and have him call office back if he has any further questions.

## 2011-08-08 ENCOUNTER — Ambulatory Visit (INDEPENDENT_AMBULATORY_CARE_PROVIDER_SITE_OTHER): Payer: Medicare Other | Admitting: Critical Care Medicine

## 2011-08-08 DIAGNOSIS — R0602 Shortness of breath: Secondary | ICD-10-CM

## 2011-08-08 DIAGNOSIS — R0600 Dyspnea, unspecified: Secondary | ICD-10-CM

## 2011-08-08 DIAGNOSIS — J984 Other disorders of lung: Secondary | ICD-10-CM

## 2011-08-08 LAB — PULMONARY FUNCTION TEST

## 2011-08-08 NOTE — Progress Notes (Signed)
PFT done today. 

## 2011-08-09 ENCOUNTER — Telehealth: Payer: Self-pay | Admitting: Critical Care Medicine

## 2011-08-09 DIAGNOSIS — J449 Chronic obstructive pulmonary disease, unspecified: Secondary | ICD-10-CM

## 2011-08-09 MED ORDER — BUDESONIDE-FORMOTEROL FUMARATE 160-4.5 MCG/ACT IN AERO: 2.0000 | INHALATION_SPRAY | Freq: Two times a day (BID) | RESPIRATORY_TRACT | Status: DC

## 2011-08-09 NOTE — Telephone Encounter (Signed)
Pt aware of abn PFTs.  Severe peripheral airway obstruction seen. I will start pt on symbicort

## 2011-08-10 ENCOUNTER — Encounter: Payer: Self-pay | Admitting: Critical Care Medicine

## 2011-08-16 ENCOUNTER — Encounter: Payer: Self-pay | Admitting: Endocrinology

## 2011-08-16 ENCOUNTER — Ambulatory Visit (INDEPENDENT_AMBULATORY_CARE_PROVIDER_SITE_OTHER): Payer: Medicare Other | Admitting: Endocrinology

## 2011-08-16 DIAGNOSIS — R079 Chest pain, unspecified: Secondary | ICD-10-CM | POA: Insufficient documentation

## 2011-08-16 NOTE — Patient Instructions (Signed)
Let's recheck the treadmill test.  you will receive a phone call, about a day and time for an appointment.

## 2011-08-16 NOTE — Progress Notes (Signed)
Subjective:    Patient ID: [name], male    DOB: February 02, 1951, 60 y.o.   MRN: [id number]  HPI Pt was ref to dr Delford Field for doe.  He had pft, overnight oximetry, and 6 minute walk.  Studies have been done, and pt is due to see dr Delford Field again next week.  However, he says doe is so bad he could not wait. He has few year of intermittent "sensation" in the chest, in the context of exertion, and assoc sob. Past Medical History  Diagnosis Date  . Anxiety   . Depression   . GERD (gastroesophageal reflux disease)   . HTN (hypertension)   . Lung disease     peritonitis after surgery in 1999  . IBS (irritable bowel syndrome)   . Dyslipidemia   . Anemia, pernicious   . ACE-inhibitor cough   . Nephrolithiasis   . Allergic rhinitis   . Diverticulosis of colon   . Elevated PSA   . Hernia   . Peritonitis   . Hyperplastic colon polyp   . Rectal fissure     Past Surgical History  Procedure Date  . Hand surgery 1973    Left  . Knee surgery 1997    Left  . Cholecystectomy 1981  . Abdominal surgery     Multiple  . Ventral hernia repair   . Nissen fundoplication 1999     complicated by gastric perforation, peritonitis, ventral nernia   . Lithotripsy     Right  . 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 Upper gastrointestinal endoscopy     History   Social History  . Marital Status: Married    Spouse Name: N/A    Number of Children: 2  . Years of Education: N/A   Occupational History  . Disabled - psych    Social History Main Topics  . Smoking status: Former Smoker -- 1.0 packs/day for 20 years    Types: Cigarettes    Quit date: 11/06/1996  . Smokeless tobacco: Former Neurosurgeon    Types: Chew    Quit date: 11/07/1983  . Alcohol Use: No  . Drug Use: No  . Sexually Active: Not on file   Other Topics Concern  . Not on file   Social History Narrative   Daily Caffeine Use:  Yes    Current Outpatient Prescriptions on File Prior to Visit  Medication Sig  Dispense Refill  . ALPRAZolam (XANAX) 1 MG tablet Take 1 mg by mouth at bedtime.       . budesonide-formoterol (SYMBICORT) 160-4.5 MCG/ACT inhaler Inhale 2 puffs into the lungs 2 (two) times daily.  1 Inhaler  6  . buPROPion (WELLBUTRIN XL) 300 MG 24 hr tablet Take 300 mg by mouth daily.        . calcium carbonate (TUMS - DOSED IN MG ELEMENTAL CALCIUM) 500 MG chewable tablet Chew 4 tablets by mouth daily as needed.       . Cyanocobalamin (VITAMIN B-12) 5000 MCG SUBL Place under the tongue daily.        . ferrous sulfate 325 (65 FE) MG tablet Take 325 mg by mouth daily with breakfast.        . multivitamin (THERAGRAN) per tablet Take 1 tablet by mouth daily.        Marland Kitchen NEXIUM 40 MG capsule Take 40 mg by mouth daily.       . Omega-3 Fatty Acids (FISH OIL PO) Take 1 capsule by mouth daily.        Marland Kitchen  omeprazole (PRILOSEC) 20 MG capsule Take 1 capsule (20 mg total) by mouth 2 (two) times daily.  60 capsule  3  . polyethylene glycol powder (GLYCOLAX/MIRALAX) powder Take 17 g by mouth daily.  527 g  1  . quinapril (ACCUPRIL) 20 MG tablet Take 20 mg by mouth daily.        . sucralfate (CARAFATE) 1 GM/10ML suspension Take 1 g by mouth as needed.        . vitamin C (ASCORBIC ACID) 500 MG tablet Take 500 mg by mouth daily.          Allergies  Allergen Reactions  . Ace Inhibitors     REACTION: cough    Family History  Problem Relation Age of Onset  . Diabetes Mother   . Coronary artery disease Mother     CABG  . Diabetes Sister   . Diabetes Brother   . Hypertension Brother   . Colon cancer Neg Hx   . Esophageal cancer Neg Hx   . Stomach cancer Neg Hx   . Clotting disorder Brother     BP 118/80  Pulse 56  Temp(Src) 97.4 F (36.3 C) (Oral)  Ht 6\' 2"  (1.88 m)  Wt 258 lb 6 oz (117.198 kg)  BMI 33.17 kg/m2  SpO2 97%  Review of Systems Denies cough and loc    Objective:   Physical Exam VITAL SIGNS:  See vs page GENERAL: no distress LUNGS:  Clear to auscultation HEART: Regular rate  and rhythm without murmurs noted. Normal S1,S2.       Assessment & Plan:  "sensation" in the chest, new, uncertain etiology Doe, uncertain relationship to chest sxs

## 2011-08-23 ENCOUNTER — Ambulatory Visit (INDEPENDENT_AMBULATORY_CARE_PROVIDER_SITE_OTHER): Payer: Medicare Other | Admitting: Critical Care Medicine

## 2011-08-23 ENCOUNTER — Encounter: Payer: Self-pay | Admitting: Critical Care Medicine

## 2011-08-23 VITALS — BP 118/62 | HR 83 | Temp 97.8°F | Ht 71.0 in | Wt 262.0 lb

## 2011-08-23 DIAGNOSIS — I10 Essential (primary) hypertension: Secondary | ICD-10-CM

## 2011-08-23 DIAGNOSIS — J449 Chronic obstructive pulmonary disease, unspecified: Secondary | ICD-10-CM

## 2011-08-23 MED ORDER — LOSARTAN POTASSIUM 50 MG PO TABS: 50.0000 mg | ORAL_TABLET | Freq: Every day | ORAL | Status: DC

## 2011-08-23 NOTE — Patient Instructions (Signed)
Stop accupril Start Losartan 50mg  daily Stay on symbicort Return 2 months

## 2011-08-23 NOTE — Progress Notes (Signed)
Subjective:    Patient ID: [name], male    DOB: 11-Jun-1951, 60 y.o.   MRN: [id number]  HPI Comments: 1999 pt had complications from abdominal surgery, was on vent with Sepsis and ARDS  , trach and MSOF.   Saw [facility] and had a giant hernia ? If diaphragmatic issue.  Now dyspnea is worse. Nissen fundoplication has loosened, hernia is sliding , no regurgitation but has chest pain into the back. No real cough  Shortness of Breath This is a new problem. The current episode started more than 1 month ago. The problem occurs daily (worse after eating, after walking). The problem has been unchanged. Associated symptoms include chest pain. Pertinent negatives include no abdominal pain, ear pain, fever, headaches, hemoptysis, leg pain, leg swelling, neck pain, orthopnea, PND, rash, rhinorrhea, sore throat, sputum production, vomiting or wheezing. The symptoms are aggravated by any activity and eating. His past medical history is significant for DVT. There is no history of allergies, asthma, bronchiolitis, CAD, chronic lung disease, COPD, a heart failure, PE or pneumonia.   60 y.o. WM   08/24/2011 At last ov 9/19 we rec: Full pfts No medication changes ONO and 6 min walk to assess oxygenation PFTs showed peripheral airflow obstruction.  DLCO 70%  TLC 80%    walk 570m and no desat Notes sl cough and pndrip.  No change in dyspnea on exertion with symbicort that was just started .  Some days worse than others, worse if bend over.   Review of Systems  Constitutional: Positive for fatigue. Negative for fever, chills, diaphoresis, activity change, appetite change and unexpected weight change.  HENT: Positive for dental problem and tinnitus. Negative for hearing loss, ear pain, nosebleeds, congestion, sore throat, facial swelling, rhinorrhea, sneezing, mouth sores, trouble swallowing, neck pain, neck stiffness, voice change, postnasal drip, sinus pressure and ear discharge.   Eyes:  Negative for photophobia, discharge, itching and visual disturbance.  Respiratory: Positive for shortness of breath. Negative for apnea, cough, hemoptysis, sputum production, choking, chest tightness, wheezing and stridor.   Cardiovascular: Positive for chest pain. Negative for palpitations, orthopnea, leg swelling and PND.  Gastrointestinal: Negative for nausea, vomiting, abdominal pain, constipation, blood in stool and abdominal distention.  Genitourinary: Negative for dysuria, urgency, frequency, hematuria, flank pain, decreased urine volume and difficulty urinating.  Musculoskeletal: Positive for myalgias and arthralgias. Negative for back pain, joint swelling and gait problem.  Skin: Negative for color change, pallor and rash.  Neurological: Positive for weakness. Negative for dizziness, tremors, seizures, syncope, speech difficulty, light-headedness, numbness and headaches.  Hematological: Negative for adenopathy. Does not bruise/bleed easily.  Psychiatric/Behavioral: Negative for confusion, sleep disturbance and agitation. The patient is nervous/anxious.        Objective:   Physical Exam   Filed Vitals:   08/23/11 1522  BP: 118/62  Pulse: 83  Temp: 97.8 F (36.6 C)  TempSrc: Oral  Height: 5\' 11"  (1.803 m)  Weight: 262 lb (118.842 kg)  SpO2: 97%    Gen: Pleasant, obese , in no distress,  normal affect  ENT: No lesions,  mouth clear,  oropharynx clear, no postnasal drip  Neck: No JVD, no TMG, no carotid bruits  Lungs: No use of accessory muscles, no dullness to percussion, clear without rales or rhonchi  Cardiovascular: RRR, heart sounds normal, no murmur or gallops, no peripheral edema  Abdomen: soft and NT, no HSM,  BS normal, scar on abdomen ,   Musculoskeletal: No deformities, no cyanosis or clubbing  Neuro: alert, non focal  Skin: Warm, no lesions or rashes  Cxr: atx at bases, scar RLL, low lung volumes     Assessment & Plan:   Obstructive chronic  bronchitis without exacerbation Status post trach and acute respiratory failure with severe sepsis ARDS, now with bibasilar scar on CT chest and peripheral airflow obstruction.  Note mild reduction in DLCO but no evidence for hypoxemia QHS or exertional in daytime.  Pt now on symbicort but not using HFA effectively I reeducated the pt as to proper use of Symbicort Pt with cyclical cough likely aggravated by ACE inhibitor use Quinipril which I note he has an ACE allergy ONO RA  Normal 07/28/11 6 min walk >54m no desat PFTs 08/08/11: DLCO 70% TLC 80%  FeV1 80%   Fef 25 75 56% with significant improvement after BD  Plan D/c quinipril, discussed with Dr Jonny Ruiz , who was on call for Dr Everardo All 10/17, ok t ochange to ARB Pt reeducated as to proper hfa use Start Losartan 50mg  /d to replace ACE inhibitor     Updated Medication List Outpatient Encounter Prescriptions as of 08/23/2011  Medication Sig Dispense Refill  . ALPRAZolam (XANAX) 1 MG tablet Take 1 mg by mouth at bedtime.       . budesonide-formoterol (SYMBICORT) 160-4.5 MCG/ACT inhaler Inhale 2 puffs into the lungs 2 (two) times daily.  1 Inhaler  6  . buPROPion (WELLBUTRIN XL) 300 MG 24 hr tablet Take 300 mg by mouth daily.        . calcium carbonate (TUMS - DOSED IN MG ELEMENTAL CALCIUM) 500 MG chewable tablet Chew 4 tablets by mouth daily as needed.       . Cyanocobalamin (VITAMIN B-12) 5000 MCG SUBL Place under the tongue daily.        . ferrous sulfate 325 (65 FE) MG tablet Take 325 mg by mouth daily with breakfast.        . multivitamin (THERAGRAN) per tablet Take 1 tablet by mouth daily.        Marland Kitchen NEXIUM 40 MG capsule Take 40 mg by mouth daily.       . Omega-3 Fatty Acids (FISH OIL PO) Take 1 capsule by mouth daily.        Marland Kitchen omeprazole (PRILOSEC) 20 MG capsule Take 1 capsule (20 mg total) by mouth 2 (two) times daily.  60 capsule  3  . polyethylene glycol powder (GLYCOLAX/MIRALAX) powder Take 17 g by mouth daily.  527 g  1  .  sucralfate (CARAFATE) 1 GM/10ML suspension Take 1 g by mouth as needed.        . vitamin C (ASCORBIC ACID) 500 MG tablet Take 500 mg by mouth daily.        Marland Kitchen DISCONTD: quinapril (ACCUPRIL) 20 MG tablet Take 20 mg by mouth daily.        Marland Kitchen losartan (COZAAR) 50 MG tablet Take 1 tablet (50 mg total) by mouth daily.  30 tablet  6

## 2011-08-24 NOTE — Assessment & Plan Note (Signed)
Status post trach and acute respiratory failure with severe sepsis ARDS, now with bibasilar scar on CT chest and peripheral airflow obstruction.  Note mild reduction in DLCO but no evidence for hypoxemia QHS or exertional in daytime.  Pt now on symbicort but not using HFA effectively I reeducated the pt as to proper use of Symbicort Pt with cyclical cough likely aggravated by ACE inhibitor use Quinipril which I note he has an ACE allergy ONO RA  Normal 07/28/11 6 min walk >529m no desat PFTs 08/08/11: DLCO 70% TLC 80%  FeV1 80%   Fef 25 75 56% with significant improvement after BD  Plan D/c quinipril, discussed with Dr Jonny Ruiz , who was on call for Dr Everardo All 10/17, ok t ochange to ARB Pt reeducated as to proper hfa use Start Losartan 50mg  /d to replace ACE inhibitor

## 2011-08-29 ENCOUNTER — Ambulatory Visit (INDEPENDENT_AMBULATORY_CARE_PROVIDER_SITE_OTHER): Payer: Medicare Other | Admitting: Physician Assistant

## 2011-08-29 DIAGNOSIS — R079 Chest pain, unspecified: Secondary | ICD-10-CM

## 2011-08-29 NOTE — Progress Notes (Signed)
[name] is a 60 y.o. male with HTN, GERD and COPD.  He had a complicated course after abdominal surgery years ago that resulted in prolonged VDRF.  He has had increased DOE over the last several months. He is currently seeing Dr. Delford Field.  Also notes constant chest pain.  No syncope.  He is an Corporate investment banker. Has a remote FHx of CAD (mom with CABG in 21s).  Exam unremarkable.   Exercise Treadmill Test  Pre-Exercise Testing Evaluation Rhythm: normal sinus  Rate: 58   PR:  .13 QRS:  .09  QT:  .40 QTc: .40     Test  Exercise Tolerance Test Ordering MD: Romero Belling M.D  Interpreting MD:  Tereso Newcomer PA-C  Unique Test No: 1  Treadmill:  1  Indication for ETT: chest pain - rule out ischemia  Contraindication to ETT: No   Stress Modality: exercise - treadmill  Cardiac Imaging Performed: non   Protocol: standard Bruce - maximal  Max BP:  184/83  Max MPHR (bpm):  161 85% MPR (bpm):  136  MPHR obtained (bpm):  139 % MPHR obtained:  86  Reached 85% MPHR (min:sec):  4:00 Total Exercise Time (min-sec):  5:03  Workload in METS:  7.0 Borg Scale: 17  Reason ETT Terminated:  patient's desire to stop    ST Segment Analysis At Rest: normal ST segments - no evidence of significant ST depression With Exercise: no evidence of significant ST depression  Other Information Arrhythmia:  No Angina during ETT:  absent (0) Quality of ETT:  diagnostic  ETT Interpretation:  normal - no evidence of ischemia by ST analysis  Comments: Fair exercise tolerance. Patient complained of chest pain prior to starting test.  Test stopped due to dyspnea/fatigue. Normal BP response to exercise. No ST-T changes to suggest ischemia.   Recommendations: Follow up with Dr. Everardo All and Dr. Delford Field as scheduled.

## 2011-09-21 ENCOUNTER — Encounter (INDEPENDENT_AMBULATORY_CARE_PROVIDER_SITE_OTHER): Payer: Medicare Other | Admitting: Surgery

## 2011-10-18 ENCOUNTER — Ambulatory Visit (INDEPENDENT_AMBULATORY_CARE_PROVIDER_SITE_OTHER): Payer: Medicare Other | Admitting: Critical Care Medicine

## 2011-10-18 ENCOUNTER — Ambulatory Visit: Payer: Medicare Other | Admitting: Critical Care Medicine

## 2011-10-18 ENCOUNTER — Encounter: Payer: Self-pay | Admitting: Critical Care Medicine

## 2011-10-18 DIAGNOSIS — J449 Chronic obstructive pulmonary disease, unspecified: Secondary | ICD-10-CM

## 2011-10-18 NOTE — Progress Notes (Signed)
Subjective:    Patient ID: [name], male    DOB: [birth year], 60 y.o.   MRN: [id number]  HPI 60 y.o. WM   10/18/2011 At last ov 9/19 we rec: Full pfts No medication changes ONO and 6 min walk to assess oxygenation PFTs showed peripheral airflow obstruction.  DLCO 70%  TLC 80%    walk 521m and no desat Notes sl cough and pndrip.  No change in dyspnea on exertion with symbicort that was just started .  Some days worse than others, worse if bend over.   10/18/2011 Could not tolerate the symbicort d/t tachycardia Has a cyclical cough , better with off ACE and on ARB  Review of Systems  Constitutional: Positive for fatigue. Negative for chills, diaphoresis, activity change, appetite change and unexpected weight change.  HENT: Positive for dental problem and tinnitus. Negative for hearing loss, nosebleeds, congestion, facial swelling, sneezing, mouth sores, trouble swallowing, neck stiffness, voice change, postnasal drip, sinus pressure and ear discharge.   Eyes: Negative for photophobia, discharge, itching and visual disturbance.  Respiratory: Negative for apnea, cough, choking, chest tightness and stridor.   Cardiovascular: Negative for palpitations.  Gastrointestinal: Negative for nausea, constipation, blood in stool and abdominal distention.  Genitourinary: Negative for dysuria, urgency, frequency, hematuria, flank pain, decreased urine volume and difficulty urinating.  Musculoskeletal: Positive for myalgias and arthralgias. Negative for back pain, joint swelling and gait problem.  Skin: Negative for color change and pallor.  Neurological: Positive for weakness. Negative for dizziness, tremors, seizures, syncope, speech difficulty, light-headedness and numbness.  Hematological: Negative for adenopathy. Does not bruise/bleed easily.  Psychiatric/Behavioral: Negative for confusion, sleep disturbance and agitation. The patient is nervous/anxious.        Objective:   Physical Exam   Filed Vitals:   10/18/11 0857  BP: 136/78  Pulse: 72  Temp: 97.7 F (36.5 C)  TempSrc: Oral  Height: 5\' 11"  (1.803 m)  Weight: 270 lb 3.2 oz (122.562 kg)  SpO2: 96%    Gen: Pleasant, obese , in no distress,  normal affect  ENT: No lesions,  mouth clear,  oropharynx clear, no postnasal drip  Neck: No JVD, no TMG, no carotid bruits  Lungs: No use of accessory muscles, no dullness to percussion, clear without rales or rhonchi  Cardiovascular: RRR, heart sounds normal, no murmur or gallops, no peripheral edema  Abdomen: soft and NT, no HSM,  BS normal, scar on abdomen ,   Musculoskeletal: No deformities, no cyanosis or clubbing  Neuro: alert, non focal  Skin: Warm, no lesions or rashes  Cxr: atx at bases, scar RLL, low lung volumes     Assessment & Plan:   Obstructive chronic bronchitis without exacerbation Mild intermittent asthma with intolerance to symbicort Cyclical cough better off ACE inhibitors Plan D/c symbicort Observation for now     Updated Medication List Outpatient Encounter Prescriptions as of 10/18/2011  Medication Sig Dispense Refill  . ALPRAZolam (XANAX) 1 MG tablet Take 1 mg by mouth at bedtime.       Marland Kitchen buPROPion (WELLBUTRIN XL) 300 MG 24 hr tablet Take 300 mg by mouth daily.        . calcium carbonate (TUMS - DOSED IN MG ELEMENTAL CALCIUM) 500 MG chewable tablet Chew 4 tablets by mouth daily as needed.       Marland Kitchen losartan (COZAAR) 50 MG tablet Take 1 tablet (50 mg total) by mouth daily.  30 tablet  6  . multivitamin (THERAGRAN) per tablet Take 1  tablet by mouth daily.        Marland Kitchen NEXIUM 40 MG capsule Take 40 mg by mouth daily.       . Omega-3 Fatty Acids (FISH OIL PO) Take 1 capsule by mouth daily.        Marland Kitchen omeprazole (PRILOSEC) 20 MG capsule Take 20 mg by mouth 2 (two) times daily as needed.        . polyethylene glycol powder (GLYCOLAX/MIRALAX) powder Take 17 g by mouth daily.  527 g  1  . sucralfate (CARAFATE) 1 GM/10ML  suspension Take 1 g by mouth as needed.        . Tamsulosin HCl (FLOMAX) 0.4 MG CAPS Take 1 tablet by mouth Twice daily.      Marland Kitchen DISCONTD: omeprazole (PRILOSEC) 20 MG capsule Take 1 capsule (20 mg total) by mouth 2 (two) times daily.  60 capsule  3  . Cyanocobalamin (VITAMIN B-12) 5000 MCG SUBL Place under the tongue daily.        . ferrous sulfate 325 (65 FE) MG tablet Take 325 mg by mouth daily with breakfast.        . DISCONTD: budesonide-formoterol (SYMBICORT) 160-4.5 MCG/ACT inhaler Inhale 2 puffs into the lungs 2 (two) times daily.  1 Inhaler  6  . DISCONTD: quinapril (ACCUPRIL) 20 MG tablet Take 1 tablet by mouth daily.      Marland Kitchen DISCONTD: vitamin C (ASCORBIC ACID) 500 MG tablet Take 500 mg by mouth daily.

## 2011-10-18 NOTE — Patient Instructions (Signed)
Stay off symbicort Return as needed

## 2011-10-18 NOTE — Assessment & Plan Note (Signed)
Mild intermittent asthma with intolerance to symbicort Cyclical cough better off ACE inhibitors Plan D/c symbicort Observation for now

## 2011-11-01 NOTE — Telephone Encounter (Signed)
Erroneous encounter

## 2011-11-07 ENCOUNTER — Other Ambulatory Visit: Payer: Self-pay | Admitting: Endocrinology

## 2011-11-09 ENCOUNTER — Encounter: Payer: Self-pay | Admitting: Internal Medicine

## 2011-11-14 ENCOUNTER — Other Ambulatory Visit: Payer: Self-pay | Admitting: *Deleted

## 2011-11-14 MED ORDER — ESOMEPRAZOLE MAGNESIUM 40 MG PO CPDR: 40.0000 mg | DELAYED_RELEASE_CAPSULE | Freq: Every day | ORAL | Status: DC

## 2011-11-14 NOTE — Telephone Encounter (Signed)
R'cd fax from [facility] for refill of Nexium  Last OV-08/2011  Last filled-10/01/2011

## 2012-01-23 ENCOUNTER — Ambulatory Visit (INDEPENDENT_AMBULATORY_CARE_PROVIDER_SITE_OTHER): Payer: Medicare Other | Admitting: Endocrinology

## 2012-01-23 ENCOUNTER — Encounter: Payer: Self-pay | Admitting: Endocrinology

## 2012-01-23 ENCOUNTER — Other Ambulatory Visit (INDEPENDENT_AMBULATORY_CARE_PROVIDER_SITE_OTHER): Payer: Medicare Other

## 2012-01-23 VITALS — BP 132/88 | HR 70 | Temp 97.4°F | Ht 71.0 in | Wt 270.0 lb

## 2012-01-23 DIAGNOSIS — R972 Elevated prostate specific antigen [PSA]: Secondary | ICD-10-CM

## 2012-01-23 DIAGNOSIS — E212 Other hyperparathyroidism: Secondary | ICD-10-CM

## 2012-01-23 DIAGNOSIS — M2550 Pain in unspecified joint: Secondary | ICD-10-CM

## 2012-01-23 DIAGNOSIS — Z79899 Other long term (current) drug therapy: Secondary | ICD-10-CM

## 2012-01-23 DIAGNOSIS — E785 Hyperlipidemia, unspecified: Secondary | ICD-10-CM

## 2012-01-23 DIAGNOSIS — R5381 Other malaise: Secondary | ICD-10-CM

## 2012-01-23 DIAGNOSIS — I10 Essential (primary) hypertension: Secondary | ICD-10-CM

## 2012-01-23 LAB — URINALYSIS, ROUTINE W REFLEX MICROSCOPIC
Nitrite: NEGATIVE
Specific Gravity, Urine: 1.01 (ref 1.000–1.030)
Urine Glucose: NEGATIVE
Urobilinogen, UA: 1 (ref 0.0–1.0)

## 2012-01-23 LAB — CBC WITH DIFFERENTIAL/PLATELET
Basophils Absolute: 0.1 10*3/uL (ref 0.0–0.1)
Basophils Relative: 0.8 % (ref 0.0–3.0)
Eosinophils Absolute: 0.1 10*3/uL (ref 0.0–0.7)
Lymphocytes Relative: 35.6 % (ref 12.0–46.0)
MCHC: 34.3 g/dL (ref 30.0–36.0)
Neutrophils Relative %: 55.3 % (ref 43.0–77.0)
RBC: 5.29 Mil/uL (ref 4.22–5.81)

## 2012-01-23 LAB — BASIC METABOLIC PANEL
CO2: 29 mEq/L (ref 19–32)
Chloride: 105 mEq/L (ref 96–112)
Potassium: 4.7 mEq/L (ref 3.5–5.1)
Sodium: 143 mEq/L (ref 135–145)

## 2012-01-23 LAB — PSA: PSA: 7.79 ng/mL — ABNORMAL HIGH (ref 0.10–4.00)

## 2012-01-23 LAB — SEDIMENTATION RATE: Sed Rate: 6 mm/hr (ref 0–22)

## 2012-01-23 LAB — HEPATIC FUNCTION PANEL
ALT: 25 U/L (ref 0–53)
Total Bilirubin: 0.5 mg/dL (ref 0.3–1.2)

## 2012-01-23 LAB — TSH: TSH: 1.78 u[IU]/mL (ref 0.35–5.50)

## 2012-01-23 LAB — TESTOSTERONE: Testosterone: 228.83 ng/dL — ABNORMAL LOW (ref 350.00–890.00)

## 2012-01-23 MED ORDER — LOSARTAN POTASSIUM-HCTZ 50-12.5 MG PO TABS
1.0000 | ORAL_TABLET | Freq: Every day | ORAL | Status: DC
Start: 2012-01-23 — End: 2013-01-18

## 2012-01-23 NOTE — Progress Notes (Signed)
Subjective:    Patient ID: [name], male    DOB: September 06, 1951, 61 y.o.   MRN: [id number]  HPI HTN: pt takes cozaar as rx'ed.  At home, he notes bp's such as 155/100.  He has lost a few lbs.   He has few mos of slight pain at all 4 extremities, and assoc fatigue.  He wants to see if hypogonadism is the cause.  Past Medical History  Diagnosis Date  . Anxiety   . Depression   . GERD (gastroesophageal reflux disease)   . HTN (hypertension)   . Lung disease     peritonitis after surgery in 1999  . IBS (irritable bowel syndrome)   . Dyslipidemia   . Anemia, pernicious   . ACE-inhibitor cough   . Nephrolithiasis   . Allergic rhinitis   . Diverticulosis of colon   . Elevated PSA   . Hernia   . Peritonitis   . Hyperplastic colon polyp   . Rectal fissure     Past Surgical History  Procedure Date  . Hand surgery 1973    Left  . Knee surgery 1997    Left  . Cholecystectomy 1981  . Abdominal surgery     Multiple  . Ventral hernia repair   . Nissen fundoplication 1999     complicated by gastric perforation, peritonitis, ventral nernia   . Lithotripsy     Right  . 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 Upper gastrointestinal endoscopy     History   Social History  . Marital Status: Married    Spouse Name: N/A    Number of Children: 2  . Years of Education: N/A   Occupational History  . Disabled - psych    Social History Main Topics  . Smoking status: Former Smoker -- 1.0 packs/day for 20 years    Types: Cigarettes    Quit date: 11/06/1996  . Smokeless tobacco: Former Neurosurgeon    Types: Chew    Quit date: 11/07/1983  . Alcohol Use: No  . Drug Use: No  . Sexually Active: Not on file   Other Topics Concern  . Not on file   Social History Narrative   Daily Caffeine Use:  Yes    Current Outpatient Prescriptions on File Prior to Visit  Medication Sig Dispense Refill  . ALPRAZolam (XANAX) 1 MG tablet Take 1 mg by mouth at bedtime.       Marland Kitchen  buPROPion (WELLBUTRIN XL) 300 MG 24 hr tablet Take 300 mg by mouth daily.        . calcium carbonate (TUMS - DOSED IN MG ELEMENTAL CALCIUM) 500 MG chewable tablet Chew 4 tablets by mouth daily as needed.       Marland Kitchen esomeprazole (NEXIUM) 40 MG capsule Take 1 capsule (40 mg total) by mouth daily.  30 capsule  5  . multivitamin (THERAGRAN) per tablet Take 1 tablet by mouth daily.        . Omega-3 Fatty Acids (FISH OIL PO) Take 1 capsule by mouth daily.        Marland Kitchen omeprazole (PRILOSEC) 20 MG capsule Take 20 mg by mouth 2 (two) times daily as needed.        . polyethylene glycol powder (GLYCOLAX/MIRALAX) powder TAKE 17GM (1 CAPFUL) MIXED IN LIQUID BY MOUTH DAILY. - EMERGENCY REFILL FAXED DR  527 g  2  . sucralfate (CARAFATE) 1 GM/10ML suspension Take 1 g by mouth as needed.        Marland Kitchen  Tamsulosin HCl (FLOMAX) 0.4 MG CAPS Take 1 tablet by mouth Twice daily.        Allergies  Allergen Reactions  . Ace Inhibitors     REACTION: cough    Family History  Problem Relation Age of Onset  . Diabetes Mother   . Coronary artery disease Mother     CABG  . Diabetes Sister   . Diabetes Brother   . Hypertension Brother   . Colon cancer Neg Hx   . Esophageal cancer Neg Hx   . Stomach cancer Neg Hx   . Clotting disorder Brother     BP 132/88  Pulse 70  Temp(Src) 97.4 F (36.3 C) (Oral)  Ht 5\' 11"  (1.803 m)  Wt 270 lb (122.471 kg)  BMI 37.66 kg/m2  SpO2 96%  Review of Systems No change in chronic sob. He has slightly decreased urinary stream (no help with flomax)    Objective:   Physical Exam VITAL SIGNS:  See vs page GENERAL: no distress LUNGS:  Clear to auscultation HEART:  Regular rate and rhythm without murmurs noted. Normal S1,S2.   Ext: no edema   Lab Results  Component Value Date   WBC 7.1 01/23/2012   HGB 16.1 01/23/2012   HCT 47.0 01/23/2012   PLT 141.0* 01/23/2012   GLUCOSE 95 01/23/2012   CHOL 148 01/23/2012   TRIG 137.0 01/23/2012   HDL 40.40 01/23/2012   LDLCALC 80 01/23/2012   ALT  25 01/23/2012   AST 24 01/23/2012   NA 143 01/23/2012   K 4.7 01/23/2012   CL 105 01/23/2012   CREATININE 1.0 01/23/2012   BUN 16 01/23/2012   CO2 29 01/23/2012   TSH 1.78 01/23/2012   PSA 7.79* 01/23/2012      Assessment & Plan:  HTN, needs increased rx Weakness, uncertain etiology elev psa, worse Thrombocytopenia, recurrent, uncertain etiology

## 2012-01-23 NOTE — Patient Instructions (Addendum)
Change losartan to losartan-hctz, 50/12.5, 1 per day.  i have sent a prescription to your pharmacy.   blood tests are being requested for you today.  You will receive a letter with results. Please come back for a wellness appointment soon. (see letter)

## 2012-01-24 ENCOUNTER — Telehealth: Payer: Self-pay | Admitting: *Deleted

## 2012-01-24 LAB — PTH, INTACT AND CALCIUM: PTH: 69.8 pg/mL (ref 14.0–72.0)

## 2012-01-25 NOTE — Telephone Encounter (Signed)
Pt informed of lab results. 

## 2012-01-25 NOTE — Telephone Encounter (Signed)
Called pt to inform of lab results, left message for pt to callback office. (Letter also mailed to pt) 

## 2012-02-28 ENCOUNTER — Encounter: Payer: Self-pay | Admitting: Internal Medicine

## 2012-02-28 ENCOUNTER — Ambulatory Visit (INDEPENDENT_AMBULATORY_CARE_PROVIDER_SITE_OTHER): Payer: Medicare Other | Admitting: Internal Medicine

## 2012-02-28 VITALS — BP 100/70 | HR 72 | Temp 97.0°F | Ht 71.0 in | Wt 272.4 lb

## 2012-02-28 DIAGNOSIS — I10 Essential (primary) hypertension: Secondary | ICD-10-CM

## 2012-02-28 DIAGNOSIS — L259 Unspecified contact dermatitis, unspecified cause: Secondary | ICD-10-CM

## 2012-02-28 DIAGNOSIS — J309 Allergic rhinitis, unspecified: Secondary | ICD-10-CM

## 2012-02-28 MED ORDER — METHYLPREDNISOLONE ACETATE 80 MG/ML IJ SUSP
120.0000 mg | Freq: Once | INTRAMUSCULAR | Status: AC
  Administered 2012-02-28: 120 mg via INTRAMUSCULAR

## 2012-02-28 MED ORDER — TRIAMCINOLONE ACETONIDE 0.1 % EX CREA: TOPICAL_CREAM | Freq: Two times a day (BID) | CUTANEOUS | Status: DC

## 2012-02-28 MED ORDER — PREDNISONE 10 MG PO TABS: ORAL_TABLET | ORAL | Status: DC

## 2012-02-28 NOTE — Assessment & Plan Note (Signed)
Incidental mild flare, should improve with tx per contact dermatitis as well today

## 2012-02-28 NOTE — Assessment & Plan Note (Signed)
Mild to mod, for depomedrol IM, predpack, and even triam cr per pt request,  to f/u any worsening symptoms or concerns

## 2012-02-28 NOTE — Patient Instructions (Signed)
You had the steroid shot today Take all new medications as prescribed Continue all other medications as before  

## 2012-02-28 NOTE — Progress Notes (Signed)
Subjective:    Patient ID: [name], male    DOB: [birth year], 61 y.o.   MRN: [id number]  HPI  Here with wife, c/o multiple areas itchy rash, specifically to right and left arms, right neck lower, and right distal leg above the knee, after working in the yard a few days ago, seems to be spreading, getting worse in area, and intensely itchy.  Pt denies chest pain, increased sob or doe, wheezing, orthopnea, PND, increased LE swelling, palpitations, dizziness or syncope.   Pt denies polydipsia, polyuria.   Pt denies fever, wt loss, night sweats, loss of appetite, or other constitutional symptoms.  Does have several wks ongoing nasal allergy symptoms with clear congestion, itch and sneeze, without fever, pain, ST, cough or wheezing Past Medical History  Diagnosis Date  . Anxiety   . Depression   . GERD (gastroesophageal reflux disease)   . HTN (hypertension)   . Lung disease     peritonitis after surgery in 1999  . IBS (irritable bowel syndrome)   . Dyslipidemia   . Anemia, pernicious   . ACE-inhibitor cough   . Nephrolithiasis   . Allergic rhinitis   . Diverticulosis of colon   . Elevated PSA   . Hernia   . Peritonitis   . Hyperplastic colon polyp   . Rectal fissure    Past Surgical History  Procedure Date  . Hand surgery 1973    Left  . Knee surgery 1997    Left  . Cholecystectomy 1981  . Abdominal surgery     Multiple  . Ventral hernia repair   . Nissen fundoplication 1999     complicated by gastric perforation, peritonitis, ventral nernia   . Lithotripsy     Right  . 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 Upper gastrointestinal endoscopy     reports that he quit smoking about 15 years ago. His smoking use included Cigarettes. He has a 20 pack-year smoking history. He quit smokeless tobacco use about 28 years ago. His smokeless tobacco use included Chew. He reports that he does not drink alcohol or use illicit drugs. family history includes Clotting  disorder in his brother; Coronary artery disease in his mother; Diabetes in his brother, mother, and sister; and Hypertension in his brother.  There is no history of Colon cancer, and Esophageal cancer, and Stomach cancer, . Allergies  Allergen Reactions  . Ace Inhibitors     REACTION: cough   Current Outpatient Prescriptions on File Prior to Visit  Medication Sig Dispense Refill  . ALPRAZolam (XANAX) 1 MG tablet Take 1 mg by mouth at bedtime.       Marland Kitchen buPROPion (WELLBUTRIN XL) 300 MG 24 hr tablet Take 300 mg by mouth daily.        . calcium carbonate (TUMS - DOSED IN MG ELEMENTAL CALCIUM) 500 MG chewable tablet Chew 4 tablets by mouth daily as needed.       Marland Kitchen esomeprazole (NEXIUM) 40 MG capsule Take 1 capsule (40 mg total) by mouth daily.  30 capsule  5  . losartan-hydrochlorothiazide (HYZAAR) 50-12.5 MG per tablet Take 1 tablet by mouth daily.  30 tablet  11  . multivitamin (THERAGRAN) per tablet Take 1 tablet by mouth daily.        . Omega-3 Fatty Acids (FISH OIL PO) Take 1 capsule by mouth daily.        Marland Kitchen omeprazole (PRILOSEC) 20 MG capsule Take 20 mg by mouth 2 (two)  times daily as needed.        . polyethylene glycol powder (GLYCOLAX/MIRALAX) powder TAKE 17GM (1 CAPFUL) MIXED IN LIQUID BY MOUTH DAILY. - EMERGENCY REFILL FAXED DR  527 g  2  . sucralfate (CARAFATE) 1 GM/10ML suspension Take 1 g by mouth as needed.        . Tamsulosin HCl (FLOMAX) 0.4 MG CAPS Take 1 tablet by mouth Twice daily.      Marland Kitchen DISCONTD: ferrous sulfate 325 (65 FE) MG tablet Take 325 mg by mouth daily with breakfast.        . DISCONTD: losartan (COZAAR) 50 MG tablet Take 1 tablet (50 mg total) by mouth daily.  30 tablet  6   No current facility-administered medications on file prior to visit.   Review of Systems    Objective:   Physical Exam BP 100/70  Pulse 72  Temp(Src) 97 F (36.1 C) (Oral)  Ht 5\' 11"  (1.803 m)  Wt 272 lb 6 oz (123.548 kg)  BMI 37.99 kg/m2  SpO2 97% Physical Exam  VS  noted Constitutional: Pt appears well-developed and well-nourished.  HENT: Head: Normocephalic.  Right Ear: External ear normal.  Left Ear: External ear normal.  Bilat tm's mild erythema.  Sinus nontender.  Pharynx mild erythema Eyes: Conjunctivae and EOM are normal. Pupils are equal, round, and reactive to light.  Neck: Normal range of motion. Neck supple.  Cardiovascular: Normal rate and regular rhythm.   Pulmonary/Chest: Effort normal and breath sounds normal.  Neurological: Pt is alert. Skin: Skin is warm. No erythema. except for large area right lateral arm , right lateral neck, right lower leg above the knee and left antecub space with typical contact dermatitis appearance Psychiatric: Pt behavior is normal. Thought content normal.     Assessment & Plan:

## 2012-02-28 NOTE — Assessment & Plan Note (Signed)
stable overall by hx and exam, most recent data reviewed with pt, and pt to continue medical treatment as before  BP Readings from Last 3 Encounters:  02/28/12 100/70  01/23/12 132/88  10/18/11 136/78

## 2012-03-19 ENCOUNTER — Ambulatory Visit (INDEPENDENT_AMBULATORY_CARE_PROVIDER_SITE_OTHER): Payer: Medicare Other | Admitting: Endocrinology

## 2012-03-19 ENCOUNTER — Encounter: Payer: Self-pay | Admitting: Endocrinology

## 2012-03-19 VITALS — BP 112/82 | HR 73 | Temp 97.0°F | Ht 71.0 in | Wt 270.0 lb

## 2012-03-19 DIAGNOSIS — I10 Essential (primary) hypertension: Secondary | ICD-10-CM

## 2012-03-19 DIAGNOSIS — Z0000 Encounter for general adult medical examination without abnormal findings: Secondary | ICD-10-CM

## 2012-03-19 NOTE — Progress Notes (Signed)
Subjective:    Patient ID: [name], male    DOB: Jan 18, 1951, 61 y.o.   MRN: [id number]  HPI here for regular wellness examination.  He's feeling pretty well in general, and says chronic med probs are stable Past Medical History  Diagnosis Date  . Anxiety   . Depression   . GERD (gastroesophageal reflux disease)   . HTN (hypertension)   . Lung disease     peritonitis after surgery in 1999  . IBS (irritable bowel syndrome)   . Dyslipidemia   . Anemia, pernicious   . ACE-inhibitor cough   . Nephrolithiasis   . Allergic rhinitis   . Diverticulosis of colon   . Elevated PSA   . Hernia   . Peritonitis   . Hyperplastic colon polyp   . Rectal fissure     Past Surgical History  Procedure Date  . Hand surgery 1973    Left  . Knee surgery 1997    Left  . Cholecystectomy 1981  . Abdominal surgery     Multiple  . Ventral hernia repair   . Nissen fundoplication 1999     complicated by gastric perforation, peritonitis, ventral nernia   . Lithotripsy     Right  . 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 Upper gastrointestinal endoscopy     History   Social History  . Marital Status: Married    Spouse Name: N/A    Number of Children: 2  . Years of Education: N/A   Occupational History  . Disabled - psych    Social History Main Topics  . Smoking status: Former Smoker -- 1.0 packs/day for 20 years    Types: Cigarettes    Quit date: 11/06/1996  . Smokeless tobacco: Former Neurosurgeon    Types: Chew    Quit date: 11/07/1983  . Alcohol Use: No  . Drug Use: No  . Sexually Active: Not on file   Other Topics Concern  . Not on file   Social History Narrative   Daily Caffeine Use:  Yes    Current Outpatient Prescriptions on File Prior to Visit  Medication Sig Dispense Refill  . ALPRAZolam (XANAX) 1 MG tablet Take 1 mg by mouth at bedtime.       Marland Kitchen buPROPion (WELLBUTRIN XL) 300 MG 24 hr tablet Take 300 mg by mouth daily.        . calcium carbonate (TUMS  - DOSED IN MG ELEMENTAL CALCIUM) 500 MG chewable tablet Chew 4 tablets by mouth daily as needed.       Marland Kitchen esomeprazole (NEXIUM) 40 MG capsule Take 1 capsule (40 mg total) by mouth daily.  30 capsule  5  . losartan-hydrochlorothiazide (HYZAAR) 50-12.5 MG per tablet Take 1 tablet by mouth daily.  30 tablet  11  . multivitamin (THERAGRAN) per tablet Take 1 tablet by mouth daily.        . Omega-3 Fatty Acids (FISH OIL PO) Take 1 capsule by mouth daily.        Marland Kitchen omeprazole (PRILOSEC) 20 MG capsule Take 20 mg by mouth 2 (two) times daily as needed.        . polyethylene glycol powder (GLYCOLAX/MIRALAX) powder TAKE 17GM (1 CAPFUL) MIXED IN LIQUID BY MOUTH DAILY. - EMERGENCY REFILL FAXED DR  527 g  2  . sucralfate (CARAFATE) 1 GM/10ML suspension Take 1 g by mouth as needed.        . testosterone cypionate (DEPOTESTOTERONE CYPIONATE) 200 MG/ML injection  Inject 200 mg into the muscle every 21 ( twenty-one) days.       Marland Kitchen DISCONTD: ferrous sulfate 325 (65 FE) MG tablet Take 325 mg by mouth daily with breakfast.        . DISCONTD: losartan (COZAAR) 50 MG tablet Take 1 tablet (50 mg total) by mouth daily.  30 tablet  6    Allergies  Allergen Reactions  . Ace Inhibitors     REACTION: cough    Family History  Problem Relation Age of Onset  . Diabetes Mother   . Coronary artery disease Mother     CABG  . Diabetes Sister   . Diabetes Brother   . Hypertension Brother   . Colon cancer Neg Hx   . Esophageal cancer Neg Hx   . Stomach cancer Neg Hx   . Clotting disorder Brother     BP 112/82  Pulse 73  Temp(Src) 97 F (36.1 C) (Oral)  Ht 5\' 11"  (1.803 m)  Wt 270 lb (122.471 kg)  BMI 37.66 kg/m2  SpO2 97%  Review of Systems  Constitutional: Negative for fever and unexpected weight change.  HENT: Negative for hearing loss.   Eyes: Negative for visual disturbance.  Respiratory:       No change in chronic doe  Cardiovascular: Negative for chest pain.  Gastrointestinal: Negative for anal  bleeding.  Genitourinary: Negative for hematuria.  Musculoskeletal: Negative for gait problem.  Skin: Negative for rash.  Psychiatric/Behavioral:       No change in chronic depression      Objective:   Physical Exam VS: see vs page GEN: no distress HEAD: head: no deformity eyes: no periorbital swelling, no proptosis external nose and ears are normal mouth: no lesion seen NECK: supple, thyroid is not enlarged.  Surgical scars at the anterior neck (tracheostomy), and right posterior neck CHEST WALL: no deformity LUNGS: clear to auscultation BREASTS:  pseudogynecomastia CV: reg rate and rhythm, no murmur ABD: abdomen is soft, nontender.  no hepatosplenomegaly.  not distended.  Several healed surgical scars.  Self-reducing ventral hernia GENITALIA/RECTAL/PROSTATE:  sees urology MUSCULOSKELETAL: muscle bulk and strength are grossly normal.  no obvious joint swelling.  gait is normal and steady EXTEMITIES: no deformity.  no ulcer on the feet.  feet are of normal color and temp.  no edema PULSES: dorsalis pedis intact bilat.  no carotid bruit NEURO:  cn 2-12 grossly intact.   readily moves all 4's.  sensation is intact to touch on the feet, but decreased from normal. SKIN:  Normal texture and temperature.  No rash or suspicious lesion is visible.  Hemangioma at the left forearm. NODES:  None palpable at the neck PSYCH: alert, oriented x3.  Does not appear anxious nor depressed.    Assessment & Plan:  Wellness visit today, with problems stable.

## 2012-03-19 NOTE — Patient Instructions (Signed)
please consider these measures for your health:  minimize alcohol.  do not use tobacco products.  have a colonoscopy at least every 10 years from age 60.  keep firearms safely stored.  always use seat belts.  have working smoke alarms in your home.  see an eye doctor and dentist regularly.  never drive under the influence of alcohol or drugs (including prescription drugs).  those with fair skin should take precautions against the sun. Please return in 1 year.   

## 2012-04-11 ENCOUNTER — Telehealth: Payer: Self-pay

## 2012-04-11 ENCOUNTER — Encounter: Payer: Self-pay | Admitting: Endocrinology

## 2012-04-11 ENCOUNTER — Ambulatory Visit (INDEPENDENT_AMBULATORY_CARE_PROVIDER_SITE_OTHER): Payer: Medicare Other | Admitting: Endocrinology

## 2012-04-11 VITALS — BP 112/70 | HR 87 | Temp 97.0°F | Ht 71.0 in | Wt 271.0 lb

## 2012-04-11 DIAGNOSIS — J069 Acute upper respiratory infection, unspecified: Secondary | ICD-10-CM

## 2012-04-11 MED ORDER — CEFUROXIME AXETIL 250 MG PO TABS: 250.0000 mg | ORAL_TABLET | Freq: Two times a day (BID) | ORAL | Status: AC

## 2012-04-11 MED ORDER — TRAMADOL HCL 50 MG PO TABS: 50.0000 mg | ORAL_TABLET | Freq: Four times a day (QID) | ORAL | Status: AC | PRN

## 2012-04-11 NOTE — Progress Notes (Signed)
Subjective:    Patient ID: [name], male    DOB: 1951-05-12, 61 y.o.   MRN: [id number]  HPI Pt states 6 days of moderate prod-quality cough in the chest, and assoc nasal congestion.   Past Medical History  Diagnosis Date  . Anxiety   . Depression   . GERD (gastroesophageal reflux disease)   . HTN (hypertension)   . Lung disease     peritonitis after surgery in 1999  . IBS (irritable bowel syndrome)   . Dyslipidemia   . Anemia, pernicious   . ACE-inhibitor cough   . Nephrolithiasis   . Allergic rhinitis   . Diverticulosis of colon   . Elevated PSA   . Hernia   . Peritonitis   . Hyperplastic colon polyp   . Rectal fissure     Past Surgical History  Procedure Date  . Hand surgery 1973    Left  . Knee surgery 1997    Left  . Cholecystectomy 1981  . Abdominal surgery     Multiple  . Ventral hernia repair   . Nissen fundoplication 1999     complicated by gastric perforation, peritonitis, ventral nernia   . Lithotripsy     Right  . 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 Upper gastrointestinal endoscopy     History   Social History  . Marital Status: Married    Spouse Name: N/A    Number of Children: 2  . Years of Education: N/A   Occupational History  . Disabled - psych    Social History Main Topics  . Smoking status: Former Smoker -- 1.0 packs/day for 20 years    Types: Cigarettes    Quit date: 11/06/1996  . Smokeless tobacco: Former Neurosurgeon    Types: Chew    Quit date: 11/07/1983  . Alcohol Use: No  . Drug Use: No  . Sexually Active: Not on file   Other Topics Concern  . Not on file   Social History Narrative   Daily Caffeine Use:  Yes    Current Outpatient Prescriptions on File Prior to Visit  Medication Sig Dispense Refill  . ALPRAZolam (XANAX) 1 MG tablet Take 1 mg by mouth at bedtime.       Marland Kitchen buPROPion (WELLBUTRIN XL) 300 MG 24 hr tablet Take 300 mg by mouth daily.        . calcium carbonate (TUMS - DOSED IN MG  ELEMENTAL CALCIUM) 500 MG chewable tablet Chew 4 tablets by mouth daily as needed.       Marland Kitchen esomeprazole (NEXIUM) 40 MG capsule Take 1 capsule (40 mg total) by mouth daily.  30 capsule  5  . losartan-hydrochlorothiazide (HYZAAR) 50-12.5 MG per tablet Take 1 tablet by mouth daily.  30 tablet  11  . multivitamin (THERAGRAN) per tablet Take 1 tablet by mouth daily.        . Omega-3 Fatty Acids (FISH OIL PO) Take 1 capsule by mouth daily.        Marland Kitchen omeprazole (PRILOSEC) 20 MG capsule Take 20 mg by mouth 2 (two) times daily as needed.        . polyethylene glycol powder (GLYCOLAX/MIRALAX) powder TAKE 17GM (1 CAPFUL) MIXED IN LIQUID BY MOUTH DAILY. - EMERGENCY REFILL FAXED DR  527 g  2  . sucralfate (CARAFATE) 1 GM/10ML suspension Take 1 g by mouth as needed.        . testosterone cypionate (DEPOTESTOTERONE CYPIONATE) 200 MG/ML injection Inject 200  mg into the muscle every 14 (fourteen) days.       Marland Kitchen DISCONTD: ferrous sulfate 325 (65 FE) MG tablet Take 325 mg by mouth daily with breakfast.        . DISCONTD: losartan (COZAAR) 50 MG tablet Take 1 tablet (50 mg total) by mouth daily.  30 tablet  6    Allergies  Allergen Reactions  . Ace Inhibitors     REACTION: cough    Family History  Problem Relation Age of Onset  . Diabetes Mother   . Coronary artery disease Mother     CABG  . Diabetes Sister   . Diabetes Brother   . Hypertension Brother   . Colon cancer Neg Hx   . Esophageal cancer Neg Hx   . Stomach cancer Neg Hx   . Clotting disorder Brother     BP 112/70  Pulse 87  Temp 97 F (36.1 C) (Oral)  Ht 5\' 11"  (1.803 m)  Wt 271 lb (122.925 kg)  BMI 37.80 kg/m2  SpO2 95%   Review of Systems He has sore throat and tearing from the eyes    Objective:   Physical Exam VITAL SIGNS:  See vs page GENERAL: no distress head: no deformity eyes: no periorbital swelling, no proptosis external nose and ears are normal mouth: no lesion seen Both tm's are slightly red LUNGS:  Clear to  auscultation     Assessment & Plan:  URI.  new

## 2012-04-11 NOTE — Telephone Encounter (Signed)
Yes, this is a god cough medication

## 2012-04-11 NOTE — Patient Instructions (Addendum)
i have sent 2 prescription to your pharmacy: antibiotic and cough medication Loratadine-d (non-prescription) will help your congestion. I hope you feel better soon.  If you don't feel better by next week, please call back.  Please call sooner if you get worse.

## 2012-04-11 NOTE — Telephone Encounter (Signed)
Pt's spouse called stating that pt picked up Rx for Tramadol and directions are q6h prn cough. Spouse is requesting clarification from MD.

## 2012-04-11 NOTE — Telephone Encounter (Signed)
Called to inform pt of MD's advisement, no answer/unable to leave message. 

## 2012-04-12 NOTE — Telephone Encounter (Signed)
Pt's spouse informed of MD's advisement.  

## 2012-06-07 ENCOUNTER — Emergency Department (HOSPITAL_COMMUNITY): Payer: Medicare Other

## 2012-06-07 ENCOUNTER — Encounter (HOSPITAL_COMMUNITY): Payer: Self-pay | Admitting: Nurse Practitioner

## 2012-06-07 ENCOUNTER — Emergency Department (HOSPITAL_COMMUNITY)
Admission: EM | Admit: 2012-06-07 | Discharge: 2012-06-07 | Disposition: A | Discharge status: Home / Self Care | Payer: Medicare Other | Attending: Emergency Medicine | Admitting: Emergency Medicine

## 2012-06-07 DIAGNOSIS — F3289 Other specified depressive episodes: Secondary | ICD-10-CM | POA: Insufficient documentation

## 2012-06-07 DIAGNOSIS — M25519 Pain in unspecified shoulder: Secondary | ICD-10-CM | POA: Insufficient documentation

## 2012-06-07 DIAGNOSIS — F329 Major depressive disorder, single episode, unspecified: Secondary | ICD-10-CM | POA: Insufficient documentation

## 2012-06-07 DIAGNOSIS — F411 Generalized anxiety disorder: Secondary | ICD-10-CM | POA: Insufficient documentation

## 2012-06-07 DIAGNOSIS — I10 Essential (primary) hypertension: Secondary | ICD-10-CM | POA: Insufficient documentation

## 2012-06-07 DIAGNOSIS — K589 Irritable bowel syndrome without diarrhea: Secondary | ICD-10-CM | POA: Insufficient documentation

## 2012-06-07 DIAGNOSIS — K219 Gastro-esophageal reflux disease without esophagitis: Secondary | ICD-10-CM | POA: Insufficient documentation

## 2012-06-07 DIAGNOSIS — Z87891 Personal history of nicotine dependence: Secondary | ICD-10-CM | POA: Insufficient documentation

## 2012-06-07 MED ORDER — HYDROMORPHONE HCL PF 1 MG/ML IJ SOLN
1.0000 mg | Freq: Once | INTRAMUSCULAR | Status: AC
  Administered 2012-06-07: 1 mg via INTRAMUSCULAR
  Filled 2012-06-07: qty 1

## 2012-06-07 MED ORDER — KETOROLAC TROMETHAMINE 30 MG/ML IJ SOLN
30.0000 mg | Freq: Once | INTRAMUSCULAR | Status: AC
  Administered 2012-06-07: 30 mg via INTRAMUSCULAR
  Filled 2012-06-07: qty 1

## 2012-06-07 MED ORDER — OXYCODONE-ACETAMINOPHEN 5-325 MG PO TABS: 1.0000 | ORAL_TABLET | Freq: Four times a day (QID) | ORAL | Status: AC | PRN

## 2012-06-07 MED ORDER — ONDANSETRON 8 MG PO TBDP: 8.0000 mg | ORAL_TABLET | Freq: Three times a day (TID) | ORAL | Status: AC | PRN

## 2012-06-07 NOTE — Progress Notes (Signed)
Orthopedic Tech Progress Note Patient Details:  [name] 08-Mar-1951 [id number]  Ortho Devices Type of Ortho Device: Sling immobilizer Ortho Device/Splint Location: (L) UE Ortho Device/Splint Interventions: Application   Jennye Moccasin 06/07/2012, 6:42 PM

## 2012-06-07 NOTE — ED Notes (Addendum)
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

## 2012-06-07 NOTE — ED Provider Notes (Addendum)
Is in her he is and 39 she was taking she got in a chest flank History  This chart was scribed for [employer]. Rubin Payor, MD by Erskine Emery. This patient was seen in room TR09C/TR09C and the patient's care was started at 18:05.   CSN: [id number]  Arrival date & time 06/07/12  1715   First MD Initiated Contact with Patient 06/07/12 1805      Chief Complaint  Patient presents with  . Shoulder Pain    (Consider location/radiation/quality/duration/timing/severity/associated sxs/prior treatment) HPI [name] is a 61 y.o. male who presents to the Emergency Department complaining of intermittent moderate left shoulder pain for the past 5 weeks with some associated nausea today. Pt reports he has been taking OTC pain medication with only mild relief from symptoms. Pt denies any injury, or any associated fevers, SOB, or chest pain. Pt denies any known relevant allergies (only ace inhibitors) and is not on any blood thinners.    Past Medical History  Diagnosis Date  . Anxiety   . Depression   . GERD (gastroesophageal reflux disease)   . HTN (hypertension)   . Lung disease     peritonitis after surgery in 1999  . IBS (irritable bowel syndrome)   . Dyslipidemia   . Anemia, pernicious   . ACE-inhibitor cough   . Nephrolithiasis   . Allergic rhinitis   . Diverticulosis of colon   . Elevated PSA   . Hernia   . Peritonitis   . Hyperplastic colon polyp   . Rectal fissure     Past Surgical History  Procedure Date  . Hand surgery 1973    Left  . Knee surgery 1997    Left  . Cholecystectomy 1981  . Abdominal surgery     Multiple  . Ventral hernia repair   . Nissen fundoplication 1999     complicated by gastric perforation, peritonitis, ventral nernia   . Lithotripsy     Right  . 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 Upper gastrointestinal endoscopy     Family History  Problem Relation Age of Onset  . Diabetes Mother   . Coronary artery disease Mother     CABG  . Diabetes Sister   . Diabetes Brother   . Hypertension Brother   . Colon cancer Neg Hx   . Esophageal cancer Neg Hx   . Stomach cancer Neg Hx   . Clotting disorder Brother     History  Substance Use Topics  . Smoking status: Former Smoker -- 1.0 packs/day for 20 years    Types: Cigarettes    Quit date: 11/06/1996  . Smokeless tobacco: Former Neurosurgeon    Types: Chew    Quit date: 11/07/1983  . Alcohol Use: No      Review of Systems  Constitutional: Negative for fever and chills.  Respiratory: Negative for shortness of breath.   Cardiovascular: Negative for chest pain.  Gastrointestinal: Positive for nausea. Negative for vomiting.  Musculoskeletal:       Left shoulder pain  Skin: Negative for wound.  Neurological: Negative for weakness.  Psychiatric/Behavioral: Negative for self-injury.    Allergies  Ace inhibitors  Home Medications   Current Outpatient Rx  Name Route Sig Dispense Refill  . ALPRAZOLAM 1 MG PO TABS Oral Take 1 mg by mouth at bedtime.     . BUPROPION HCL ER (XL) 300 MG PO TB24 Oral Take 300 mg by mouth daily. depression    .  CALCIUM CARBONATE ANTACID 500 MG PO CHEW Oral Chew 4 tablets by mouth daily as needed.     Marland Kitchen ESOMEPRAZOLE MAGNESIUM 40 MG PO CPDR Oral Take 1 capsule (40 mg total) by mouth daily. 30 capsule 5  . LOSARTAN POTASSIUM-HCTZ 50-12.5 MG PO TABS Oral Take 1 tablet by mouth daily. 30 tablet 11  . POLYETHYLENE GLYCOL 3350 PO POWD  TAKE 17GM (1 CAPFUL) MIXED IN LIQUID BY MOUTH DAILY. - EMERGENCY REFILL FAXED DR 527 g 2    Generic ZOX:WRUEAVW   POW 3350 NF  . TESTOSTERONE CYPIONATE 200 MG/ML IM OIL Intramuscular Inject 200 mg into the muscle every 14 (fourteen) days. Take on fridays    . TRAMADOL HCL 50 MG PO TABS Oral Take 50 mg by mouth every 6 (six) hours as needed. For pain    . ONDANSETRON 8 MG PO TBDP Oral Take 1 tablet (8 mg total) by mouth every 8 (eight) hours as needed for nausea. 20 tablet 0  . OXYCODONE-ACETAMINOPHEN 5-325 MG  PO TABS Oral Take 1-2 tablets by mouth every 6 (six) hours as needed for pain. 20 tablet 0    BP 121/69  Pulse 102  Temp 98.9 F (37.2 C) (Oral)  Resp 14  SpO2 94%  Physical Exam  Nursing note and vitals reviewed. Constitutional: He is oriented to person, place, and time. He appears well-developed and well-nourished. No distress.  HENT:  Head: Normocephalic and atraumatic.  Eyes: EOM are normal.  Neck: Neck supple. No tracheal deviation present.  Cardiovascular: Normal rate, regular rhythm and intact distal pulses.   Pulmonary/Chest: Effort normal and breath sounds normal. No respiratory distress. He has no wheezes. He has no rales.  Musculoskeletal: Normal range of motion.       Pain with motion of left arm but no pain with movement of left elbow. Some swelling anteriorly. No erythema. Neurovascular is intact. Good radial pulse.     Neurological: He is alert and oriented to person, place, and time.  Skin: Skin is warm and dry.  Psychiatric: He has a normal mood and affect. His behavior is normal.    ED Course  Procedures (including critical care time) DIAGNOSTIC STUDIES: Oxygen Saturation is 100% on room air, normal by my interpretation.    COORDINATION OF CARE: 18:23--I evaluated the patient and we discussed a treatment plan including better pain medication to which the pt agreed. I notified the pt and his wife that his x-rays look good and instructed them to follow up with an orthopedic specialist.   18:49--I rechecked the pt and let him know that the only thing I can do for him now is prescribe pain medication. I instructed him to continue to try to follow up with an orthopedic physician. I informed his wife that ice and heat will not do much difference at this point, but ice could possibly help.   19:45--I rechecked the pt who is feeling moderately better, with his pain decreased to a 3-4/10. His ROM has improved but there is still pain in the shoulder upon movement. We are  going to recheck his BP because the pt claims he has had hypotension associated with taking pain medications before. If everything checks out okay we will discharge him home with the pain medication.  Labs Reviewed - No data to display No results found.   1. Shoulder pain       MDM  Patient presented with left shoulder pain. Worse recently. X-ray does not show fracture. Does not appear  to be an irritable joint delta infection. Patient will followup with [facility] orthopedics as planned.    I personally performed the services described in this documentation, which was scribed in my presence. The recorded information has been reviewed and considered.     Date: 07/15/2012  Rate: 73  Rhythm: normal sinus rhythm  QRS Axis: normal  Intervals: normal  ST/T Wave abnormalities: normal  Conduction Disutrbances:none  Narrative Interpretation:   Old EKG Reviewed: none available         Kadyn Chovan R. Rubin Payor, MD 06/09/12 2357  Juliet Rude. Rubin Payor, MD 07/15/12 2053

## 2012-06-10 ENCOUNTER — Telehealth: Payer: Self-pay

## 2012-06-10 NOTE — Telephone Encounter (Signed)
[facility] Triage Call Report Triage Record Num: [id number] Operator: Amy Head Patient Name: [name] Call Date & Time: 06/09/2012 8:10:43AM Patient Phone: [phone] PCP: Romero Belling Patient Gender: Male PCP Fax : [phone] Patient DOB: 01-06-1951 [facility] Name[facility] Reason for Call: Caller: Crystal/Other; PCP: Romero Belling; CB#: [phone]; Call regarding 06/07/12 Seen in ED for Torn Rotator Cuff, Given Percocet But Making Him Sick (daughter Is RN, Gave Phenerghan); States that patient is pale, slammy, sweaty, dizzy, nauseated and is vomiting. Onset symtpoms after starting Percocet prescription given to patient in Emergency Room 06/07/12. Wearing sling and applying ice as directed. Pain is almost "untolerable" and wants to know if something else can be prescribed for pain without Codiene and possibly a muscle relaxer. Daughter who is caring for patient is flying out of town in the morning. Patient has an appointment with Orthopedic doctor on Tuesday, 06/11/12. All emergent symptoms per Nausea or Vomiting protocol ruled out. See provider within 24 hours per guidelines. Called in Flexaril 5 Milligrams by mouth 3 times daily until the next business day per standing orders #5, no refills, to [facility]; [phone], left message on voicemail. Advised patient to call office in AM for appointment for futher medications to treat injury/pain. States that she will call the Orthopedic office in AM to see if patient can get in for appointment there tomorrow and if not will call office for appointment. Protocol(s) Used: Nausea or Vomiting Recommended Outcome per Protocol: Call Provider within 24 Hours Reason for Outcome: Symptoms began after starting or changing dose of prescription, nonprescription, alternative medication, or illicit drug Care Advice: ~ 06/09/2012 8:38:00AM Page 1 of 1 [id number]

## 2012-09-09 ENCOUNTER — Other Ambulatory Visit: Payer: Self-pay | Admitting: Endocrinology

## 2012-09-09 ENCOUNTER — Telehealth: Payer: Self-pay | Admitting: *Deleted

## 2012-09-09 MED ORDER — ESOMEPRAZOLE MAGNESIUM 40 MG PO CPDR: 40.0000 mg | DELAYED_RELEASE_CAPSULE | Freq: Every day | ORAL | Status: DC

## 2012-09-09 NOTE — Telephone Encounter (Signed)
Medication refill requested 

## 2012-09-23 ENCOUNTER — Encounter: Payer: Self-pay | Admitting: Endocrinology

## 2012-09-23 NOTE — Telephone Encounter (Signed)
Error

## 2012-10-15 ENCOUNTER — Ambulatory Visit (INDEPENDENT_AMBULATORY_CARE_PROVIDER_SITE_OTHER): Payer: Medicare Other | Admitting: Endocrinology

## 2012-10-15 ENCOUNTER — Encounter: Payer: Self-pay | Admitting: Endocrinology

## 2012-10-15 VITALS — BP 126/80 | HR 76 | Temp 98.6°F | Wt 269.0 lb

## 2012-10-15 DIAGNOSIS — Z0000 Encounter for general adult medical examination without abnormal findings: Secondary | ICD-10-CM

## 2012-10-15 DIAGNOSIS — I10 Essential (primary) hypertension: Secondary | ICD-10-CM

## 2012-10-15 DIAGNOSIS — G500 Trigeminal neuralgia: Secondary | ICD-10-CM | POA: Insufficient documentation

## 2012-10-15 DIAGNOSIS — E291 Testicular hypofunction: Secondary | ICD-10-CM

## 2012-10-15 DIAGNOSIS — J069 Acute upper respiratory infection, unspecified: Secondary | ICD-10-CM

## 2012-10-15 MED ORDER — DOXYCYCLINE HYCLATE 100 MG PO TABS: 100.0000 mg | ORAL_TABLET | Freq: Two times a day (BID) | ORAL | Status: DC

## 2012-10-15 NOTE — Patient Instructions (Addendum)
i have sent a prescription to your pharmacy, for an antibiotic pill please consider these measures for your health:  minimize alcohol.  do not use tobacco products.  have a colonoscopy at least every 10 years from age 60.  keep firearms safely stored.  always use seat belts.  have working smoke alarms in your home.  see an eye doctor and dentist regularly.  never drive under the influence of alcohol or drugs (including prescription drugs).  those with fair skin should take precautions against the sun.   Please return in 1 year.

## 2012-10-15 NOTE — Progress Notes (Signed)
Subjective:    Patient ID: [name], male    DOB: [birth year], 61 y.o.   MRN: [id number]  HPI here for regular wellness examination.  He's feeling pretty well in general, and says chronic med probs are stable, except as noted below Past Medical History  Diagnosis Date  . Anxiety   . Depression   . GERD (gastroesophageal reflux disease)   . HTN (hypertension)   . Lung disease     peritonitis after surgery in 1999  . IBS (irritable bowel syndrome)   . Dyslipidemia   . Anemia, pernicious   . ACE-inhibitor cough   . Nephrolithiasis   . Allergic rhinitis   . Diverticulosis of colon   . Elevated PSA   . Hernia   . Peritonitis   . Hyperplastic colon polyp   . Rectal fissure     Past Surgical History  Procedure Date  . Hand surgery 1973    Left  . Knee surgery 1997    Left  . Cholecystectomy 1981  . Abdominal surgery     Multiple  . Ventral hernia repair   . Nissen fundoplication 1999     complicated by gastric perforation, peritonitis, ventral nernia   . Lithotripsy     Right  . 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 Upper gastrointestinal endoscopy     History   Social History  . Marital Status: Married    Spouse Name: N/A    Number of Children: 2  . Years of Education: N/A   Occupational History  . Disabled - psych    Social History Main Topics  . Smoking status: Former Smoker -- 1.0 packs/day for 20 years    Types: Cigarettes    Quit date: 11/06/1996  . Smokeless tobacco: Former Neurosurgeon    Types: Chew    Quit date: 11/07/1983  . Alcohol Use: No  . Drug Use: No  . Sexually Active: Not on file   Other Topics Concern  . Not on file   Social History Narrative   Daily Caffeine Use:  Yes    Current Outpatient Prescriptions on File Prior to Visit  Medication Sig Dispense Refill  . ALPRAZolam (XANAX) 1 MG tablet Take 1 mg by mouth at bedtime.       Marland Kitchen buPROPion (WELLBUTRIN XL) 300 MG 24 hr tablet Take 300 mg by mouth daily. depression       . calcium carbonate (TUMS - DOSED IN MG ELEMENTAL CALCIUM) 500 MG chewable tablet Chew 4 tablets by mouth daily as needed.       Marland Kitchen esomeprazole (NEXIUM) 40 MG capsule Take 1 capsule (40 mg total) by mouth daily before breakfast.  30 capsule  1  . losartan-hydrochlorothiazide (HYZAAR) 50-12.5 MG per tablet Take 1 tablet by mouth daily.  30 tablet  11  . polyethylene glycol powder (GLYCOLAX/MIRALAX) powder TAKE 17GM (1 CAPFUL) MIXED IN LIQUID BY MOUTH DAILY. - EMERGENCY REFILL FAXED DR  527 g  2  . testosterone cypionate (DEPOTESTOTERONE CYPIONATE) 200 MG/ML injection Inject 200 mg into the muscle every 14 (fourteen) days. Take on fridays      . traMADol (ULTRAM) 50 MG tablet Take 50 mg by mouth every 6 (six) hours as needed. For pain      . [DISCONTINUED] ferrous sulfate 325 (65 FE) MG tablet Take 325 mg by mouth daily with breakfast.        . [DISCONTINUED] losartan (COZAAR) 50 MG tablet Take 1 tablet (50  mg total) by mouth daily.  30 tablet  6    Allergies  Allergen Reactions  . Ace Inhibitors     REACTION: cough    Family History  Problem Relation Age of Onset  . Diabetes Mother   . Coronary artery disease Mother     CABG  . Diabetes Sister   . Diabetes Brother   . Hypertension Brother   . Colon cancer Neg Hx   . Esophageal cancer Neg Hx   . Stomach cancer Neg Hx   . Clotting disorder Brother     BP 126/80  Pulse 76  Temp 98.6 F (37 C) (Oral)  Wt 269 lb (122.018 kg)  SpO2 94%     Review of Systems  Constitutional: Negative for unexpected weight change.  HENT: Negative for hearing loss.   Eyes: Negative for visual disturbance.  Respiratory: Negative for shortness of breath.   Cardiovascular: Negative for chest pain.  Gastrointestinal: Negative for anal bleeding.  Genitourinary: Negative for hematuria.  Musculoskeletal: Negative for back pain.  Skin: Negative for rash.  Neurological: Negative for syncope.  Hematological: Does not bruise/bleed easily.   Psychiatric/Behavioral:       Depression is well-controlled       Objective:   Physical Exam VS: see vs page GEN: no distress HEAD: head: no deformity eyes: no periorbital swelling, no proptosis external nose and ears are normal mouth: no lesion seen NECK: supple, thyroid is not enlarged.  Old healed surgical scar (tracheostomy) CHEST WALL: no deformity LUNGS: clear to auscultation BREASTS:  No gynecomastia CV: reg rate and rhythm, no murmur ABD: abdomen is soft, nontender.  no hepatosplenomegaly.  not distended.  Several self-reducing incisional hernias.  Multiple old healed surgical scars GENITALIA/RECTAL/PROSTATE:  sees urology MUSCULOSKELETAL: muscle bulk and strength are grossly normal.  no obvious joint swelling.  gait is normal and steady EXTEMITIES: no deformity.  no ulcer on the feet.  feet are of normal color and temp.  no edema PULSES: dorsalis pedis intact bilat.  no carotid bruit NEURO:  cn 2-12 grossly intact.   readily moves all 4's.  sensation is intact to touch on the feet SKIN:  Normal texture and temperature.  No rash or suspicious lesion is visible.   NODES:  None palpable at the neck PSYCH: alert, oriented x3.  Does not appear anxious nor depressed.        Assessment & Plan:  Wellness visit today, with problems stable, except as noted.   SEPARATE EVALUATION FOLLOWS--EACH PROBLEM HERE IS NEW, NOT RESPONDING TO TREATMENT, OR POSES SIGNIFICANT RISK TO THE PATIENT'S HEALTH: HISTORY OF THE PRESENT ILLNESS: Pt states few days of pain at the right ear, and assoc nasal "dryness." PAST MEDICAL HISTORY reviewed and up to date today REVIEW OF SYSTEMS: Denies fever PHYSICAL EXAMINATION: VITAL SIGNS:  See vs page Right tm is red.  The left is normal GENERAL: no distress IMPRESSION: URI, new PLAN: See instruction page

## 2012-11-19 ENCOUNTER — Ambulatory Visit (INDEPENDENT_AMBULATORY_CARE_PROVIDER_SITE_OTHER): Payer: Medicare Other | Admitting: Family Medicine

## 2012-11-19 ENCOUNTER — Encounter: Payer: Self-pay | Admitting: Family Medicine

## 2012-11-19 VITALS — BP 120/80 | HR 97 | Temp 97.8°F | Ht 71.5 in | Wt 272.0 lb

## 2012-11-19 DIAGNOSIS — F419 Anxiety disorder, unspecified: Secondary | ICD-10-CM

## 2012-11-19 DIAGNOSIS — D696 Thrombocytopenia, unspecified: Secondary | ICD-10-CM

## 2012-11-19 DIAGNOSIS — F329 Major depressive disorder, single episode, unspecified: Secondary | ICD-10-CM

## 2012-11-19 DIAGNOSIS — J449 Chronic obstructive pulmonary disease, unspecified: Secondary | ICD-10-CM

## 2012-11-19 DIAGNOSIS — E538 Deficiency of other specified B group vitamins: Secondary | ICD-10-CM

## 2012-11-19 DIAGNOSIS — F32A Depression, unspecified: Secondary | ICD-10-CM | POA: Insufficient documentation

## 2012-11-19 DIAGNOSIS — F341 Dysthymic disorder: Secondary | ICD-10-CM

## 2012-11-19 DIAGNOSIS — E785 Hyperlipidemia, unspecified: Secondary | ICD-10-CM

## 2012-11-19 DIAGNOSIS — N400 Enlarged prostate without lower urinary tract symptoms: Secondary | ICD-10-CM

## 2012-11-19 DIAGNOSIS — I10 Essential (primary) hypertension: Secondary | ICD-10-CM

## 2012-11-19 DIAGNOSIS — F3289 Other specified depressive episodes: Secondary | ICD-10-CM

## 2012-11-19 DIAGNOSIS — J4489 Other specified chronic obstructive pulmonary disease: Secondary | ICD-10-CM

## 2012-11-19 DIAGNOSIS — K449 Diaphragmatic hernia without obstruction or gangrene: Secondary | ICD-10-CM

## 2012-11-19 LAB — LIPID PANEL
Cholesterol: 161 mg/dL (ref 0–200)
HDL: 38.8 mg/dL — ABNORMAL LOW (ref >39.00)
LDL Cholesterol: 84 mg/dL (ref 0–99)
VLDL: 38.6 mg/dL (ref 0.0–40.0)

## 2012-11-19 LAB — CBC WITH DIFFERENTIAL/PLATELET
Basophils Absolute: 0 10*3/uL (ref 0.0–0.1)
Eosinophils Absolute: 0.1 10*3/uL (ref 0.0–0.7)
Eosinophils Relative: 0.9 % (ref 0.0–5.0)
Lymphs Abs: 3.3 10*3/uL (ref 0.7–4.0)
MCHC: 33.2 g/dL (ref 30.0–36.0)
MCV: 80.1 fl (ref 78.0–100.0)
Monocytes Absolute: 0.7 10*3/uL (ref 0.1–1.0)
Neutrophils Relative %: 53.6 % (ref 43.0–77.0)
Platelets: 151 10*3/uL (ref 150.0–400.0)
RDW: 18.2 % — ABNORMAL HIGH (ref 11.5–14.6)
WBC: 8.8 10*3/uL (ref 4.5–10.5)

## 2012-11-19 LAB — VITAMIN B12: Vitamin B-12: 255 pg/mL (ref 211–911)

## 2012-11-19 NOTE — Patient Instructions (Signed)
-  We have ordered labs or studies at this visit. It can take up to 1-2 weeks for results and processing. We will contact you with instructions IF your results are abnormal. Normal results will be released to your [facility]. If you have not heard from [place] or can not find your results in [facility] in 2 weeks please contact our office.  -PLEASE SIGN UP FOR MYCHART TODAY   We recommend the following healthy lifestyle measures: - eat a healthy diet consisting of lots of vegetables, fruits, beans, nuts, seeds, healthy meats such as white chicken and fish and whole grains.  - avoid fried foods, fast food, processed foods, sodas, red meet and other fattening foods.  - get a least 150 minutes of aerobic exercise per week.   Follow up in: 2-3 months for follow up

## 2012-11-19 NOTE — Progress Notes (Signed)
Chief Complaint  Patient presents with  . Establish Care    HPI:  [name] is here to establish care.  Last PCP and physical: Last physical in the last year with Dr. Everardo All in 10/2012 Work: on disability after complications following Nissen fundoplication - was in a coma for two months, has residual chest pain from reflux and psych issues after this Home Situation: lives with wife Spiritual Beliefs: baptist, christian  Has the following chronic problems and concerns today:  MMP - see problem list Per review of recent records - jump in PSA and thrombocytopenia on labs 01/2012 - do not see that these were addressed per review of records. PTH and calcium normal. Low testosterone.   Chronic Problems tx with medication:  HTN/HLD: -Losartan-hctz -labs in 3.2013 showed cholesterol at goal -stable  Depression/Anxiety: -followed by Dr. Lafayette Dragon in Psych, had counseleing in the past -hospitalization: no hospitalizations -interference with daily activities: yes, on disability -current tx: wellbutrin and xanax  Hypogonadism/BPH/Elevated PSA: -followed by his urologist -testosterone injections with urology stopped due to increased PSA -has appointment in one month for PSA and testosterone check  GERD/large hernia/Hx bowel obstruction: -nexium, mirilax - followed by Dr. Daphine Deutscher ([facility]) and Dr. Dickie La -has really back reflux - sometimes requires extra PPI and carafate -hx of failed nissen, bowel perforation, hiatal hernia -reports hx B12 def and wants to check this -stable  COPD: -not on any meds -DOE at baseline, no oxygen -followed by Dr. Delford Field -stable  Other Providers: -Dr. Vernie Ammons - [facility] for hypogonadism, BPH, elevated PSA -Dr. Lafayette Dragon - Psych for depression and anxiety -Dr. Daphine Deutscher - General Surgery for hiatal hernia -Dr. Dickie La - GI for GERD for hx bowel obstruction -Dr. Delford Field - Pulm for COPD -Dr. Venetia Maxon - [facility] for chronic pain in face, trigeminal neuralgia,  still has pains from time to time  Health Maintenance: -vaccines: had flu shot -last physical: dec 2013  ROS: See pertinent positives and negatives per HPI.  Patient Active Problem List  Diagnosis  . HYPERTENSION  . ALLERGIC RHINITIS  . GERD followed by Dr. Dickie La   . Rosacea  . Obstructive chronic bronchitis without exacerbation, followed by Dr. Delford Field  . COLONIC POLYPS, HYPERPLASTIC, HX OF  . BPH (benign prostatic hyperplasia), followed by Dr. Vernie Ammons  . Other testicular hypofunction, followed by Dr. Vernie Ammons in Urology  . Trigeminal neuralgia, s/p NSU, followed by Dr. Venetia Maxon  . Anxiety and depression, followed by Dr. Lafayette Dragon in Psych  . Hiatal hernia, followed by Dr. Daphine Deutscher ([facility]) and Dr. Dickie La (GI)    Past Medical History  Diagnosis Date  . Anxiety   . Depression   . GERD (gastroesophageal reflux disease)   . HTN (hypertension)   . Lung disease     peritonitis after surgery in 1999  . IBS (irritable bowel syndrome)   . Dyslipidemia   . Anemia, pernicious   . ACE-inhibitor cough   . Nephrolithiasis   . Allergic rhinitis   . Diverticulosis of colon   . Elevated PSA   . Hernia   . Peritonitis   . Hyperplastic colon polyp   . Rectal fissure   . Arthritis   . Depression   . Hypertension   . Hx of blood transfusion reaction   . Kidney stone     Family History  Problem Relation Age of Onset  . Diabetes Mother   . Coronary artery disease Mother     CABG  . Diabetes Sister   . Diabetes Brother   .  Hypertension Brother   . Colon cancer Neg Hx   . Esophageal cancer Neg Hx   . Stomach cancer Neg Hx   . Clotting disorder Brother     History   Social History  . Marital Status: Married    Spouse Name: N/A    Number of Children: 2  . Years of Education: N/A   Occupational History  . Disabled - psych    Social History Main Topics  . Smoking status: Former Smoker -- 1.0 packs/day for 20 years    Types: Cigarettes    Quit date: 11/06/1996  . Smokeless tobacco:  Former Neurosurgeon    Types: Chew    Quit date: 11/07/1983  . Alcohol Use: No  . Drug Use: No  . Sexually Active: None   Other Topics Concern  . None   Social History Narrative   Daily Caffeine Use:  Yes    Current outpatient prescriptions:ALPRAZolam (XANAX) 1 MG tablet, Take 1 mg by mouth at bedtime. , Disp: , Rfl: ;  buPROPion (WELLBUTRIN XL) 300 MG 24 hr tablet, Take 300 mg by mouth daily. depression, Disp: , Rfl: ;  esomeprazole (NEXIUM) 40 MG capsule, Take 1 capsule (40 mg total) by mouth daily before breakfast., Disp: 30 capsule, Rfl: 1 losartan-hydrochlorothiazide (HYZAAR) 50-12.5 MG per tablet, Take 1 tablet by mouth daily., Disp: 30 tablet, Rfl: 11;  polyethylene glycol powder (GLYCOLAX/MIRALAX) powder, TAKE 17GM (1 CAPFUL) MIXED IN LIQUID BY MOUTH DAILY. - EMERGENCY REFILL FAXED DR, Disp: 527 g, Rfl: 2;  sucralfate (CARAFATE) 1 G tablet, Take 1 g by mouth 4 (four) times daily., Disp: , Rfl:  [DISCONTINUED] ferrous sulfate 325 (65 FE) MG tablet, Take 325 mg by mouth daily with breakfast.  , Disp: , Rfl: ;  [DISCONTINUED] losartan (COZAAR) 50 MG tablet, Take 1 tablet (50 mg total) by mouth daily., Disp: 30 tablet, Rfl: 6  EXAM:  Filed Vitals:   11/19/12 1300  BP: 120/80  Pulse: 97  Temp: 97.8 F (36.6 C)    Body mass index is 37.41 kg/(m^2).  GENERAL: vitals reviewed and listed above, alert, oriented, appears well hydrated and in no acute distress  HEENT: atraumatic, conjunttiva clear, no obvious abnormalities on inspection of external nose and ears  NECK: no obvious masses on inspection  LUNGS: clear to auscultation bilaterally, no wheezes, rales or rhonchi, good air movement  CV: HRRR, no peripheral edema  MS: moves all extremities without noticeable abnormality  PSYCH: pleasant and cooperative, no obvious depression or anxiety  ASSESSMENT AND PLAN:  Discussed the following assessment and plan:  1. HYPERTENSION  Hemoglobin A1c  2. HYPERLIPIDEMIA  Lipid Panel  3.  DEPRESSION, followed by Dr. Lafayette Dragon    4. Obstructive chronic bronchitis without exacerbation, followed by Dr. Delford Field    5. BPH (benign prostatic hyperplasia), followed by Dr. Vernie Ammons    6. Thrombocytopenia  CBC with Differential  7. Vitamin B 12 deficiency  Vitamin B12  8. Anxiety and depression, followed by Dr. Lafayette Dragon in Psych    9. Hiatal hernia, followed by Dr. Daphine Deutscher ([facility]) and Dr. Dickie La (GI)     -NON-FASTING LABS TODAY per orders -Advised to see Urologist for BPH, elevated PSA, hypogonadism -  has follow up next month to recheck PSA, testosterone, will request last OV notes -he will check on cost of zostavax and let us know at next visit if wants this  -We reviewed the PMH, PSH, FH, SH, Meds and Allergies. -We provided refills for any medications we will  prescribe as needed. -We have advised patient to follow up per instructions below.   -Patient advised to return or notify a doctor immediately if symptoms worsen or persist or new concerns arise.  Patient Instructions  -We have ordered labs or studies at this visit. It can take up to 1-2 weeks for results and processing. We will contact you with instructions IF your results are abnormal. Normal results will be released to your [facility]. If you have not heard from [place] or can not find your results in [facility] in 2 weeks please contact our office.  -PLEASE SIGN UP FOR MYCHART TODAY   We recommend the following healthy lifestyle measures: - eat a healthy diet consisting of lots of vegetables, fruits, beans, nuts, seeds, healthy meats such as white chicken and fish and whole grains.  - avoid fried foods, fast food, processed foods, sodas, red meet and other fattening foods.  - get a least 150 minutes of aerobic exercise per week.   Follow up in: 2-3 months for follow up      Kriste Basque R.

## 2012-11-20 ENCOUNTER — Telehealth: Payer: Self-pay | Admitting: Family Medicine

## 2012-11-20 NOTE — Telephone Encounter (Signed)
Called and spoke with pt and pt is aware.  

## 2012-11-20 NOTE — Telephone Encounter (Signed)
Labs look ok.  -triglycerides are a little elevated and diabetes screening lab indicates he is at risk for diabtes ---> the best tx for this is a healthy diet and regular exercise which I would recommend.  -B12 is in the normal range, but is on the low end of normal. Advise sublingual daily - can get OTC

## 2012-11-22 ENCOUNTER — Ambulatory Visit: Payer: Medicare Other | Admitting: Family Medicine

## 2012-12-04 ENCOUNTER — Encounter: Payer: Self-pay | Admitting: Family Medicine

## 2012-12-04 ENCOUNTER — Ambulatory Visit (INDEPENDENT_AMBULATORY_CARE_PROVIDER_SITE_OTHER): Payer: Medicare Other | Admitting: Family Medicine

## 2012-12-04 VITALS — BP 118/80 | HR 92 | Temp 97.8°F | Wt 275.0 lb

## 2012-12-04 DIAGNOSIS — J069 Acute upper respiratory infection, unspecified: Secondary | ICD-10-CM

## 2012-12-04 MED ORDER — BENZONATATE 100 MG PO CAPS: 100.0000 mg | ORAL_CAPSULE | Freq: Three times a day (TID) | ORAL | Status: DC | PRN

## 2012-12-04 MED ORDER — ALBUTEROL SULFATE HFA 108 (90 BASE) MCG/ACT IN AERS: 2.0000 | INHALATION_SPRAY | Freq: Four times a day (QID) | RESPIRATORY_TRACT | Status: DC | PRN

## 2012-12-04 NOTE — Patient Instructions (Addendum)
INSTRUCTIONS FOR UPPER RESPIRATORY INFECTION:  -plenty of rest and fluids  -nasal saline wash 2-3 times daily (use prepackaged nasal saline or bottled/distilled water if making your own)   -can use tylenol or ibuprofen as directed for aches and sorethroat  -in the winter time, using a humidifier at night is helpful (please follow cleaning instructions)  -if you are taking a cough medication - use only as directed, may also try a teaspoon of honey to coat the throat and throat lozenges  -for sore throat, salt water gargles can help  -follow up if you have fevers, facial pain, tooth pain, difficulty breathing or are worsening or not getting better in 5-7 days  

## 2012-12-04 NOTE — Progress Notes (Signed)
Chief Complaint  Patient presents with  . Cough    achy body, stuffy nose, headache, fever last night of 100.6    HPI:  Acute visit for cough: -started:yesterday -symptoms:nasal congestion, no sputum production, sore throat, cough, body aches, low grade fever with chills last night -denies:fever, SOB, NVD, tooth pain, exposure to the flu -has tried: nyquil, dayquil, ibuprofen - did not take anything this morning -sick contacts: wife with sore throat -Hx of: bad reflux followed by Dr. Juanda Chance in GI, COPD - followed by Dr. Delford Field in [place], not on any meds for this - not improved with symbicort and worsened by acei per review of chart -had his flu shot   ROS: See pertinent positives and negatives per HPI.  Past Medical History  Diagnosis Date  . Anxiety   . Depression   . GERD (gastroesophageal reflux disease)   . HTN (hypertension)   . Lung disease     peritonitis after surgery in 1999  . IBS (irritable bowel syndrome)   . Dyslipidemia   . Anemia, pernicious   . ACE-inhibitor cough   . Nephrolithiasis   . Allergic rhinitis   . Diverticulosis of colon   . Elevated PSA   . Hernia   . Peritonitis   . Hyperplastic colon polyp   . Rectal fissure   . Arthritis   . Depression   . Hypertension   . Hx of blood transfusion reaction   . Kidney stone   . Hyperlipidemia   . Rectal fissure   . Hyperparathyroidism   . Nephrolithiasis     Family History  Problem Relation Age of Onset  . Diabetes Mother   . Coronary artery disease Mother     CABG  . Diabetes Sister   . Diabetes Brother   . Hypertension Brother   . Colon cancer Neg Hx   . Esophageal cancer Neg Hx   . Stomach cancer Neg Hx   . Clotting disorder Brother     History   Social History  . Marital Status: Married    Spouse Name: N/A    Number of Children: 2  . Years of Education: N/A   Occupational History  . Disabled - psych    Social History Main Topics  . Smoking status: Former Smoker -- 1.0  packs/day for 20 years    Types: Cigarettes    Quit date: 11/06/1996  . Smokeless tobacco: Former Neurosurgeon    Types: Chew    Quit date: 11/07/1983  . Alcohol Use: No  . Drug Use: No  . Sexually Active: None   Other Topics Concern  . None   Social History Narrative   Daily Caffeine Use:  Yes    Current outpatient prescriptions:ALPRAZolam (XANAX) 1 MG tablet, Take 1 mg by mouth at bedtime. , Disp: , Rfl: ;  buPROPion (WELLBUTRIN XL) 300 MG 24 hr tablet, Take 300 mg by mouth daily. depression, Disp: , Rfl: ;  esomeprazole (NEXIUM) 40 MG capsule, Take 1 capsule (40 mg total) by mouth daily before breakfast., Disp: 30 capsule, Rfl: 1;  gabapentin (NEURONTIN) 100 MG capsule, Take 900 mg by mouth daily., Disp: , Rfl:  losartan-hydrochlorothiazide (HYZAAR) 50-12.5 MG per tablet, Take 1 tablet by mouth daily., Disp: 30 tablet, Rfl: 11;  polyethylene glycol powder (GLYCOLAX/MIRALAX) powder, TAKE 17GM (1 CAPFUL) MIXED IN LIQUID BY MOUTH DAILY. - EMERGENCY REFILL FAXED DR, Disp: 527 g, Rfl: 2;  sucralfate (CARAFATE) 1 G tablet, Take 1 g by mouth 4 (four) times daily.,  Disp: , Rfl:  albuterol (PROVENTIL HFA;VENTOLIN HFA) 108 (90 BASE) MCG/ACT inhaler, Inhale 2 puffs into the lungs every 6 (six) hours as needed for wheezing., Disp: 1 Inhaler, Rfl: 0;  benzonatate (TESSALON PERLES) 100 MG capsule, Take 1 capsule (100 mg total) by mouth 3 (three) times daily as needed for cough., Disp: 20 capsule, Rfl: 0;  [DISCONTINUED] ferrous sulfate 325 (65 FE) MG tablet, Take 325 mg by mouth daily with breakfast.  , Disp: , Rfl:  [DISCONTINUED] losartan (COZAAR) 50 MG tablet, Take 1 tablet (50 mg total) by mouth daily., Disp: 30 tablet, Rfl: 6  EXAM:  Filed Vitals:   12/04/12 1103  BP: 118/80  Pulse: 92  Temp: 97.8 F (36.6 C)    There is no height on file to calculate BMI.  GENERAL: vitals reviewed and listed above, alert, oriented, appears well hydrated and in no acute distress  HEENT: atraumatic, conjunttiva  clear, no obvious abnormalities on inspection of external nose and ears, normal appearance of ear canals and TMs, clear nasal congestion, mild post oropharyngeal erythema with PND, no tonsillar edema or exudate, no sinus TTP  NECK: no obvious masses on inspection  LUNGS: clear to auscultation bilaterally, no wheezes, rales or rhonchi, good air movement  CV: HRRR, no peripheral edema  MS: moves all extremities without noticeable abnormality  PSYCH: pleasant and cooperative, no obvious depression or anxiety  ASSESSMENT AND PLAN:  Discussed the following assessment and plan:  1. Upper respiratory infection  albuterol (PROVENTIL HFA;VENTOLIN HFA) 108 (90 BASE) MCG/ACT inhaler, benzonatate (TESSALON PERLES) 100 MG capsule   -likely viral, possible flu though less likely given mild symptoms and had flu shot - offered testing, but after discussion risks/benefits tamiflu if positive pt does not want to take this -supportive care and return precautions -no pulm symptoms and normal pulm exam, alb refilled as reports expired, has not needed -Patient advised to return or notify a doctor immediately if symptoms worsen or persist or new concerns arise.  Patient Instructions  INSTRUCTIONS FOR UPPER RESPIRATORY INFECTION:  -plenty of rest and fluids  -nasal saline wash 2-3 times daily (use prepackaged nasal saline or bottled/distilled water if making your own)   -can use tylenol or ibuprofen as directed for aches and sorethroat  -in the winter time, using a humidifier at night is helpful (please follow cleaning instructions)  -if you are taking a cough medication - use only as directed, may also try a teaspoon of honey to coat the throat and throat lozenges  -for sore throat, salt water gargles can help  -follow up if you have fevers, facial pain, tooth pain, difficulty breathing or are worsening or not getting better in 5-7 days      Jeffrey Mccarey R.

## 2012-12-11 ENCOUNTER — Other Ambulatory Visit: Payer: Self-pay | Admitting: Family Medicine

## 2012-12-11 MED ORDER — POLYETHYLENE GLYCOL 3350 17 GM/SCOOP PO POWD: ORAL | Status: DC

## 2012-12-11 NOTE — Telephone Encounter (Signed)
Pt is a new pt. w/ Dr Selena Batten. Pt needs new script for polyethylene glycol powder (GLYCOLAX/MIRALAX) powder. Pharm:  [facility]/ [address], [place]  Phone no: [phone]

## 2012-12-11 NOTE — Telephone Encounter (Signed)
Rx sent to pharmacy   

## 2012-12-11 NOTE — Telephone Encounter (Signed)
Pls advise if this can be sent to pharmacy.

## 2012-12-27 ENCOUNTER — Other Ambulatory Visit: Payer: Self-pay | Admitting: Neurosurgery

## 2012-12-27 DIAGNOSIS — G500 Trigeminal neuralgia: Secondary | ICD-10-CM

## 2012-12-31 ENCOUNTER — Other Ambulatory Visit: Payer: Self-pay | Admitting: Neurosurgery

## 2012-12-31 ENCOUNTER — Ambulatory Visit
Admission: RE | Admit: 2012-12-31 | Discharge: 2012-12-31 | Disposition: A | Discharge status: Home / Self Care | Payer: Medicare Other | Source: Ambulatory Visit | Attending: Neurosurgery | Admitting: Neurosurgery

## 2012-12-31 DIAGNOSIS — G500 Trigeminal neuralgia: Secondary | ICD-10-CM

## 2012-12-31 LAB — CREATININE, SERUM: Creat: 0.97 mg/dL (ref 0.50–1.35)

## 2013-01-01 ENCOUNTER — Encounter: Payer: Self-pay | Admitting: Family Medicine

## 2013-01-01 NOTE — Progress Notes (Signed)
OV from 12/31/12 from Dr. Pollyann Kennedy in ENT scanned in. 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 Trigeminal neuralgia.

## 2013-01-02 ENCOUNTER — Ambulatory Visit
Admission: RE | Admit: 2013-01-02 | Discharge: 2013-01-02 | Disposition: A | Discharge status: Home / Self Care | Payer: Medicare Other | Source: Ambulatory Visit | Attending: Neurosurgery | Admitting: Neurosurgery

## 2013-01-02 DIAGNOSIS — G500 Trigeminal neuralgia: Secondary | ICD-10-CM

## 2013-01-02 MED ORDER — GADOBENATE DIMEGLUMINE 529 MG/ML IV SOLN
15.0000 mL | Freq: Once | INTRAVENOUS | Status: AC | PRN
  Administered 2013-01-02: 15 mL via INTRAVENOUS

## 2013-01-18 ENCOUNTER — Other Ambulatory Visit: Payer: Self-pay | Admitting: Endocrinology

## 2013-02-11 ENCOUNTER — Ambulatory Visit (INDEPENDENT_AMBULATORY_CARE_PROVIDER_SITE_OTHER)
Admission: RE | Admit: 2013-02-11 | Discharge: 2013-02-11 | Disposition: A | Discharge status: Home / Self Care | Payer: Medicare Other | Source: Ambulatory Visit | Attending: Family Medicine | Admitting: Family Medicine

## 2013-02-11 ENCOUNTER — Ambulatory Visit (INDEPENDENT_AMBULATORY_CARE_PROVIDER_SITE_OTHER): Payer: Medicare Other | Admitting: Family Medicine

## 2013-02-11 ENCOUNTER — Telehealth: Payer: Self-pay | Admitting: Family Medicine

## 2013-02-11 ENCOUNTER — Encounter: Payer: Self-pay | Admitting: Family Medicine

## 2013-02-11 VITALS — HR 88 | Temp 100.1°F | Wt 274.0 lb

## 2013-02-11 DIAGNOSIS — R0600 Dyspnea, unspecified: Secondary | ICD-10-CM

## 2013-02-11 DIAGNOSIS — R0989 Other specified symptoms and signs involving the circulatory and respiratory systems: Secondary | ICD-10-CM

## 2013-02-11 DIAGNOSIS — R0609 Other forms of dyspnea: Secondary | ICD-10-CM

## 2013-02-11 DIAGNOSIS — R112 Nausea with vomiting, unspecified: Secondary | ICD-10-CM

## 2013-02-11 LAB — COMPREHENSIVE METABOLIC PANEL
ALT: 30 U/L (ref 0–53)
AST: 30 U/L (ref 0–37)
CO2: 27 mEq/L (ref 19–32)
Calcium: 8.7 mg/dL (ref 8.4–10.5)
Chloride: 100 mEq/L (ref 96–112)
Creatinine, Ser: 1.5 mg/dL (ref 0.4–1.5)
GFR: 50.63 mL/min — ABNORMAL LOW (ref >60.00)
Sodium: 139 mEq/L (ref 135–145)
Total Bilirubin: 0.9 mg/dL (ref 0.3–1.2)
Total Protein: 7 g/dL (ref 6.0–8.3)

## 2013-02-11 LAB — CBC WITH DIFFERENTIAL/PLATELET
Basophils Absolute: 0 10*3/uL (ref 0.0–0.1)
Eosinophils Absolute: 0 10*3/uL (ref 0.0–0.7)
Lymphocytes Relative: 4.4 % — ABNORMAL LOW (ref 12.0–46.0)
MCHC: 34.5 g/dL (ref 30.0–36.0)
Monocytes Relative: 5.1 % (ref 3.0–12.0)
Platelets: 177 10*3/uL (ref 150.0–400.0)
RDW: 15.8 % — ABNORMAL HIGH (ref 11.5–14.6)

## 2013-02-11 MED ORDER — ONDANSETRON HCL 4 MG PO TABS: 4.0000 mg | ORAL_TABLET | Freq: Three times a day (TID) | ORAL | Status: DC | PRN

## 2013-02-11 NOTE — Progress Notes (Signed)
Chief Complaint  Patient presents with  . nausea, vomiting, fever    HPI: 62 yo male with PMH sig for IBS, anxiety, GERD, nephrolithiasis, diverticulosis here for acute visit for nausea and vomiting: -started last night  -symptoms: loose stool yesterday morning - but this normal for him as take mirilax, nausea and vomiting - has had 5 episodes of emesis, body aches all over, fever up to 101.6 at the highest, SOB - has COPD and has this on and off, followed by pulm but reports inhalers dont help, , able to tolerate drink and eat, some difuse crampy abd pain last night - but better today -sees Dr. Delford Field for is COPD - doesn't take any meds for this - alb gives him shakes -denies: cough, runny nose, blood or mucus in stools, wheezing, abd pain today, recent travel or abx, dysuria, flank pain, headache, rash -no known sick contacts -took ibuprofen at 6:30  ROS: See pertinent positives and negatives per HPI.  Past Medical History  Diagnosis Date  . Anxiety   . Depression   . GERD (gastroesophageal reflux disease)   . HTN (hypertension)   . Lung disease     peritonitis after surgery in 1999  . IBS (irritable bowel syndrome)   . Dyslipidemia   . Anemia, pernicious   . ACE-inhibitor cough   . Nephrolithiasis   . Allergic rhinitis   . Diverticulosis of colon   . Elevated PSA   . Hernia   . Peritonitis   . Hyperplastic colon polyp   . Rectal fissure   . Arthritis   . Depression   . Hypertension   . Hx of blood transfusion reaction   . Kidney stone   . Hyperlipidemia   . Rectal fissure   . Hyperparathyroidism   . Nephrolithiasis     Family History  Problem Relation Age of Onset  . Diabetes Mother   . Coronary artery disease Mother     CABG  . Diabetes Sister   . Diabetes Brother   . Hypertension Brother   . Colon cancer Neg Hx   . Esophageal cancer Neg Hx   . Stomach cancer Neg Hx   . Clotting disorder Brother     History   Social History  . Marital Status:  Married    Spouse Name: N/A    Number of Children: 2  . Years of Education: N/A   Occupational History  . Disabled - psych    Social History Main Topics  . Smoking status: Former Smoker -- 1.00 packs/day for 20 years    Types: Cigarettes    Quit date: 11/06/1996  . Smokeless tobacco: Former Neurosurgeon    Types: Chew    Quit date: 11/07/1983  . Alcohol Use: No  . Drug Use: No  . Sexually Active: None   Other Topics Concern  . None   Social History Narrative   Daily Caffeine Use:  Yes          Current outpatient prescriptions:albuterol (PROVENTIL HFA;VENTOLIN HFA) 108 (90 BASE) MCG/ACT inhaler, Inhale 2 puffs into the lungs every 6 (six) hours as needed for wheezing., Disp: 1 Inhaler, Rfl: 0;  ALPRAZolam (XANAX) 1 MG tablet, Take 1 mg by mouth at bedtime. , Disp: , Rfl: ;  benzonatate (TESSALON PERLES) 100 MG capsule, Take 1 capsule (100 mg total) by mouth 3 (three) times daily as needed for cough., Disp: 20 capsule, Rfl: 0 buPROPion (WELLBUTRIN XL) 300 MG 24 hr tablet, Take 300 mg by mouth  daily. depression, Disp: , Rfl: ;  esomeprazole (NEXIUM) 40 MG capsule, Take 1 capsule (40 mg total) by mouth daily before breakfast., Disp: 30 capsule, Rfl: 1;  gabapentin (NEURONTIN) 100 MG capsule, Take 900 mg by mouth daily., Disp: , Rfl: ;  losartan-hydrochlorothiazide (HYZAAR) 50-12.5 MG per tablet, TAKE 1 TABLET BY MOUTH EVERY DAY, Disp: 30 tablet, Rfl: 0 polyethylene glycol powder (GLYCOLAX/MIRALAX) powder, Use one capful per day as needed for constipation, Disp: 527 g, Rfl: 2;  sucralfate (CARAFATE) 1 G tablet, Take 1 g by mouth 4 (four) times daily., Disp: , Rfl: ;  ondansetron (ZOFRAN) 4 MG tablet, Take 1 tablet (4 mg total) by mouth every 8 (eight) hours as needed for nausea., Disp: 20 tablet, Rfl: 0 [DISCONTINUED] ferrous sulfate 325 (65 FE) MG tablet, Take 325 mg by mouth daily with breakfast.  , Disp: , Rfl: ;  [DISCONTINUED] losartan (COZAAR) 50 MG tablet, Take 1 tablet (50 mg total) by  mouth daily., Disp: 30 tablet, Rfl: 6  EXAM:  Filed Vitals:   02/11/13 1018  Pulse: 88  Temp: 100.1 F (37.8 C)  O2 sats 98%  Body mass index is 37.69 kg/(m^2).  GENERAL: vitals reviewed and listed above, alert, oriented, appears well hydrated and in no acute distress  HEENT: atraumatic, conjunttiva clear, no obvious abnormalities on inspection of external nose and ears  NECK: no obvious masses on inspection  LUNGS: clear to auscultation bilaterally, no wheezes, rales or rhonchi, good air movement  CV: HRRR, pulse 88 on my exam, no peripheral edema  ABD: soft, NTTP, BS +, no rebound or guarding, midline surgical car  MS: moves all extremities without noticeable abnormality  PSYCH: pleasant and cooperative, no obvious depression or anxiety  ASSESSMENT AND PLAN:  Discussed the following assessment and plan:  Dyspnea - Plan: DG Chest 2 View, CANCELED: DG Chest 2 View  Nausea and vomiting - Plan: CBC with Differential, CMP, Lipase, ondansetron (ZOFRAN) 4 MG tablet  -discussed while likely viral gastrointestinal illness, other possible serious etiologies - pt does not want to go to hospital, will get labs and CXR - exam benign, zofran prn, plenty of fluids, pt is to go to ED immediately if worseneing or new symptoms, close follow up -Patient advised to return or notify a doctor immediately if symptoms worsen or persist or new concerns arise.  Patient Instructions  -go get cxr  -no dairy  -lots of fuids  -use zofran as instructed if needed  -if worsening symptoms, higher fevers, abd pain, can not tolerate fluids - go to the emergency room     Ethal Gotay, [facility] R.

## 2013-02-11 NOTE — Telephone Encounter (Signed)
Called and spoke with pt's wife and he is feeling better.

## 2013-02-11 NOTE — Telephone Encounter (Signed)
Please let him know CXR looks ok -can try his albuterol for his SOB, if this doe snot help and if worsening or persisting SOB needs to go to ED.

## 2013-02-11 NOTE — Telephone Encounter (Signed)
[phone] (home)  Called to see how pt is doing and discuss labs. Wife reports he is feeling better. I advised if any more SOB, feeling worse, nausea vomiting - should go to ED and discussed potential etiologies -including by not limited to infectious, cardiac (though no CP), etc. Wife agreeable to recs. If feels fine, will still follow up in 2 days and repeat labs, if any more SOB or feels worse again will go to ED.

## 2013-02-11 NOTE — Patient Instructions (Addendum)
-  go get cxr  -no dairy  -lots of fuids  -use zofran as instructed if needed  -if worsening symptoms, higher fevers, abd pain, can not tolerate fluids - go to the emergency room

## 2013-02-12 ENCOUNTER — Telehealth: Payer: Self-pay | Admitting: Family Medicine

## 2013-02-12 NOTE — Telephone Encounter (Signed)
Called to check on patient and talked with his wife [place] Sink. He is doing much better, no fever, no more vomiting and appetie has returned. He has appt with his [facility] for follow up of his hernia and CT scan Friday and has close follow up with me.

## 2013-02-14 ENCOUNTER — Encounter (INDEPENDENT_AMBULATORY_CARE_PROVIDER_SITE_OTHER): Payer: Self-pay | Admitting: Surgery

## 2013-02-14 ENCOUNTER — Ambulatory Visit (INDEPENDENT_AMBULATORY_CARE_PROVIDER_SITE_OTHER): Payer: Medicare Other | Admitting: Surgery

## 2013-02-14 ENCOUNTER — Other Ambulatory Visit (INDEPENDENT_AMBULATORY_CARE_PROVIDER_SITE_OTHER): Payer: Self-pay

## 2013-02-14 VITALS — BP 126/78 | HR 68 | Temp 97.3°F | Resp 16 | Ht 71.0 in | Wt 273.0 lb

## 2013-02-14 DIAGNOSIS — K432 Incisional hernia without obstruction or gangrene: Secondary | ICD-10-CM

## 2013-02-14 DIAGNOSIS — K439 Ventral hernia without obstruction or gangrene: Secondary | ICD-10-CM

## 2013-02-14 NOTE — Progress Notes (Signed)
[name] 62 y.o.  Body mass index is 38.09 kg/(m^2).  Patient Active Problem List  Diagnosis  . HYPERTENSION  . ALLERGIC RHINITIS  . GERD followed by Dr. Juanda Chance  . Rosacea  . Obstructive chronic bronchitis without exacerbation, followed by Dr. Delford Field  . COLONIC POLYPS, HYPERPLASTIC, HX OF  . BPH (benign prostatic hyperplasia), followed by Dr. Vernie Ammons  . Other testicular hypofunction, followed by Dr. Vernie Ammons in Urology  . Trigeminal neuralgia, s/p NSU, followed by Dr. Venetia Maxon  . Anxiety and depression, followed by Dr. Lafayette Dragon in Psych  . Hiatal hernia, followed by Dr. Daphine Deutscher ([facility]) and Dr. Juanda Chance (GI)    Allergies  Allergen Reactions  . Ace Inhibitors     REACTION: cough    Past Surgical History  Procedure Laterality Date  . Hand surgery  1973    Left  . Knee surgery  1997    Left  . Cholecystectomy  1981  . Abdominal surgery      Multiple  . Ventral hernia repair    . Nissen fundoplication  1999     complicated by gastric perforation, peritonitis, ventral nernia   . Lithotripsy      Right  . Brain surgery  March 2012    Trigeminal nerve  . Upper gastrointestinal endoscopy    . Hernia repair     Kriste Basque R., DO--at [facility] No diagnosis found.  [place] [facility] [place] [facility] came in today Did discuss his upper abdominal ventral hernia. He has gained in weight since he was placed back on gabapentin in January for recurrent deloroux.  On exam this is protruding when he does Valsalva. Rest [place] in place and biological mesh that obviously dissolved and he is left with a pretty wide diastases and ventral hernia. He's trying to figure what to do about this and one possibility would be a extensive lateral release and attempted primary closure. 1 obtain CT scan abdomen pelvis and see him back in the office. Matt B. Daphine Deutscher, MD, [facility] [phone] beeper [phone]  02/14/2013 9:33 AM

## 2013-02-17 ENCOUNTER — Ambulatory Visit: Payer: Medicare Other | Admitting: Family Medicine

## 2013-02-17 ENCOUNTER — Ambulatory Visit (INDEPENDENT_AMBULATORY_CARE_PROVIDER_SITE_OTHER): Payer: Medicare Other | Admitting: Family Medicine

## 2013-02-17 ENCOUNTER — Encounter: Payer: Self-pay | Admitting: Family Medicine

## 2013-02-17 VITALS — BP 128/80 | HR 74 | Temp 98.1°F | Wt 275.0 lb

## 2013-02-17 DIAGNOSIS — I10 Essential (primary) hypertension: Secondary | ICD-10-CM

## 2013-02-17 DIAGNOSIS — J4489 Other specified chronic obstructive pulmonary disease: Secondary | ICD-10-CM

## 2013-02-17 DIAGNOSIS — J309 Allergic rhinitis, unspecified: Secondary | ICD-10-CM

## 2013-02-17 DIAGNOSIS — K219 Gastro-esophageal reflux disease without esophagitis: Secondary | ICD-10-CM

## 2013-02-17 DIAGNOSIS — J449 Chronic obstructive pulmonary disease, unspecified: Secondary | ICD-10-CM

## 2013-02-17 DIAGNOSIS — F329 Major depressive disorder, single episode, unspecified: Secondary | ICD-10-CM

## 2013-02-17 DIAGNOSIS — N400 Enlarged prostate without lower urinary tract symptoms: Secondary | ICD-10-CM

## 2013-02-17 DIAGNOSIS — F341 Dysthymic disorder: Secondary | ICD-10-CM

## 2013-02-17 DIAGNOSIS — F32A Depression, unspecified: Secondary | ICD-10-CM

## 2013-02-17 NOTE — Progress Notes (Signed)
Chief Complaint  Patient presents with  . 3 month fu    HPI:  Follow up viral gastroenteritis: -seen last week with fever, nausea vomiting -elevated neutraphils on labs -symptoms resolved and now is feeling well -denies: vomiting, fevers, chills, body aches  HTN/HLD/Prediabetes: -on losartan-HCTZ -stable -denies: CP, change in SOB, swelling, palpitations  Dep/Anx: -followed by Dr. Lafayette Dragon -stable  GERD/Hernia/Hx bowel obstruction: -followed by Dr. Daphine Deutscher and Dickie La - has CT scanned Monday to re-eval hernia  BPH/hypogonadism/elevated PSA: Followed by Urology - seeing his urologist today  COPD:  -followed by Dr. Delford Field -inhalers do not help -stable  Trigeminal Neuralgia -resolved -backing down on gabapentin as gums bleeding    ROS: See pertinent positives and negatives per HPI.  Past Medical History  Diagnosis Date  . Anxiety   . Depression   . GERD (gastroesophageal reflux disease)   . HTN (hypertension)   . Lung disease     peritonitis after surgery in 1999  . IBS (irritable bowel syndrome)   . Dyslipidemia   . Anemia, pernicious   . ACE-inhibitor cough   . Nephrolithiasis   . Allergic rhinitis   . Diverticulosis of colon   . Elevated PSA   . Hernia   . Peritonitis   . Hyperplastic colon polyp   . Rectal fissure   . Arthritis   . Depression   . Hypertension   . Hx of blood transfusion reaction   . Kidney stone   . Hyperlipidemia   . Rectal fissure   . Hyperparathyroidism   . Nephrolithiasis     Family History  Problem Relation Age of Onset  . Diabetes Mother   . Coronary artery disease Mother     CABG  . Diabetes Sister   . Diabetes Brother   . Hypertension Brother   . Colon cancer Neg Hx   . Esophageal cancer Neg Hx   . Stomach cancer Neg Hx   . Clotting disorder Brother     History   Social History  . Marital Status: Married    Spouse Name: N/A    Number of Children: 2  . Years of Education: N/A   Occupational History  .  Disabled - psych    Social History Main Topics  . Smoking status: Former Smoker -- 1.00 packs/day for 20 years    Types: Cigarettes    Quit date: 11/06/1996  . Smokeless tobacco: Former Neurosurgeon    Types: Chew    Quit date: 11/07/1983  . Alcohol Use: No  . Drug Use: No  . Sexually Active: None   Other Topics Concern  . None   Social History Narrative   Daily Caffeine Use:  Yes          Current outpatient prescriptions:albuterol (PROVENTIL HFA;VENTOLIN HFA) 108 (90 BASE) MCG/ACT inhaler, Inhale 2 puffs into the lungs every 6 (six) hours as needed for wheezing., Disp: 1 Inhaler, Rfl: 0;  ALPRAZolam (XANAX) 1 MG tablet, Take 1 mg by mouth at bedtime. , Disp: , Rfl: ;  buPROPion (WELLBUTRIN XL) 300 MG 24 hr tablet, Take 300 mg by mouth daily. depression, Disp: , Rfl:  esomeprazole (NEXIUM) 40 MG capsule, Take 1 capsule (40 mg total) by mouth daily before breakfast., Disp: 30 capsule, Rfl: 1;  gabapentin (NEURONTIN) 100 MG capsule, Take 800 mg by mouth daily. , Disp: , Rfl: ;  losartan-hydrochlorothiazide (HYZAAR) 50-12.5 MG per tablet, TAKE 1 TABLET BY MOUTH EVERY DAY, Disp: 30 tablet, Rfl: 0 polyethylene glycol powder (GLYCOLAX/MIRALAX) powder,  Use one capful per day as needed for constipation, Disp: 527 g, Rfl: 2;  sucralfate (CARAFATE) 1 G tablet, Take 1 g by mouth 4 (four) times daily., Disp: , Rfl: ;  ondansetron (ZOFRAN) 4 MG tablet, Take 1 tablet (4 mg total) by mouth every 8 (eight) hours as needed for nausea., Disp: 20 tablet, Rfl: 0 [DISCONTINUED] ferrous sulfate 325 (65 FE) MG tablet, Take 325 mg by mouth daily with breakfast.  , Disp: , Rfl: ;  [DISCONTINUED] losartan (COZAAR) 50 MG tablet, Take 1 tablet (50 mg total) by mouth daily., Disp: 30 tablet, Rfl: 6  EXAM:  Filed Vitals:   02/17/13 0836  BP: 128/80  Pulse: 74  Temp: 98.1 F (36.7 C)    Body mass index is 38.37 kg/(m^2).  GENERAL: vitals reviewed and listed above, alert, oriented, appears well hydrated and in no  acute distress  HEENT: atraumatic, conjunttiva clear, no obvious abnormalities on inspection of external nose and ears  NECK: no obvious masses on inspection  LUNGS: clear to auscultation bilaterally, no wheezes, rales or rhonchi, good air movement  ABD: protuberant,hernia, soft, NTTP, normal BS  CV: HRRR, no peripheral edema  MS: moves all extremities without noticeable abnormality  PSYCH: pleasant and cooperative, no obvious depression or anxiety  ASSESSMENT AND PLAN:  Discussed the following assessment and plan:  Obstructive chronic bronchitis without exacerbation, followed by Dr. Delford Field  BPH (benign prostatic hyperplasia), followed by Dr. Vernie Ammons  HYPERTENSION  ALLERGIC RHINITIS  GERD followed by Dr. Juanda Chance  Anxiety and depression, followed by Dr. Lafayette Dragon in Psych  -he ate today and does not want to do labs - offered recheck on cbc - he prefers to hold off on this -he is going to work on cutting back on sweets and carbs and walk more -follow u in 3-4 months and will do labs then -Patient advised to return or notify a doctor immediately if symptoms worsen or persist or new concerns arise.  There are no Patient Instructions on file for this visit.   Kriste Basque R.

## 2013-02-17 NOTE — Patient Instructions (Addendum)
-  use [facility] clinical rinse to see if this helps your gums and see the dentisit  -can continue to do trial on lower dose of neurotin  -work on cutting back on sweets and carbs and walk daily  -follow up in 3-4 months for morning appointment for fasting labs

## 2013-02-19 ENCOUNTER — Ambulatory Visit
Admission: RE | Admit: 2013-02-19 | Discharge: 2013-02-19 | Disposition: A | Discharge status: Home / Self Care | Payer: Medicare Other | Source: Ambulatory Visit | Attending: Surgery | Admitting: Surgery

## 2013-02-19 DIAGNOSIS — K439 Ventral hernia without obstruction or gangrene: Secondary | ICD-10-CM

## 2013-02-19 MED ORDER — IOHEXOL 300 MG/ML  SOLN: 125.0000 mL | Freq: Once | INTRAMUSCULAR | Status: AC | PRN

## 2013-02-20 ENCOUNTER — Other Ambulatory Visit: Payer: Self-pay | Admitting: Endocrinology

## 2013-02-25 ENCOUNTER — Telehealth (INDEPENDENT_AMBULATORY_CARE_PROVIDER_SITE_OTHER): Payer: Self-pay

## 2013-02-25 NOTE — Telephone Encounter (Signed)
Spoke with pt - letting him know that I have him scheduled to come see Dr. Daphine Deutscher on Thursday, April 24 @ 1140a - to go over recent CT scan results.

## 2013-02-27 ENCOUNTER — Ambulatory Visit (INDEPENDENT_AMBULATORY_CARE_PROVIDER_SITE_OTHER): Payer: Medicare Other | Admitting: Surgery

## 2013-02-27 VITALS — BP 138/84 | HR 72 | Temp 98.0°F | Resp 18 | Ht 71.0 in | Wt 273.0 lb

## 2013-02-27 DIAGNOSIS — K439 Ventral hernia without obstruction or gangrene: Secondary | ICD-10-CM

## 2013-02-27 NOTE — Progress Notes (Signed)
[name] Comes in today and I reviewed the CT scan report with him. His large hernia contains his transverse colon part of stomach. Also has and a separate little hernia down with some small bowel in it. He has had his abdominal wall hernia repaired twice, once by me using Gore-Tex and once by Boykin Peek at [facility] using a biologic.  I would refer him to Dr. Dartha Lodge or Dr. Virgilio Frees at [facility]

## 2013-02-27 NOTE — Patient Instructions (Addendum)
I would recommend that you see either Dr. Dartha Lodge or Dr. Virgilio Frees at [facility] in [place].

## 2013-03-04 DIAGNOSIS — K439 Ventral hernia without obstruction or gangrene: Secondary | ICD-10-CM | POA: Insufficient documentation

## 2013-03-07 ENCOUNTER — Telehealth (INDEPENDENT_AMBULATORY_CARE_PROVIDER_SITE_OTHER): Payer: Self-pay | Admitting: General Surgery

## 2013-03-07 NOTE — Telephone Encounter (Signed)
Pt called to let Dr. Daphine Deutscher know he has an appt with Dr. Dartha Lodge in [place] on 03/24/13 to address his large hernia.  He is asking fdor Dr. Daphine Deutscher to forward the appropriate medical records to him for his appt.  Dr. Melida Quitter phone number is [phone]; pt does not have the FAX number to his office.

## 2013-03-22 ENCOUNTER — Other Ambulatory Visit: Payer: Self-pay | Admitting: Endocrinology

## 2013-03-25 ENCOUNTER — Other Ambulatory Visit: Payer: Self-pay | Admitting: Family Medicine

## 2013-03-28 ENCOUNTER — Other Ambulatory Visit: Payer: Self-pay | Admitting: Family Medicine

## 2013-04-22 ENCOUNTER — Other Ambulatory Visit: Payer: Self-pay | Admitting: Family Medicine

## 2013-05-20 ENCOUNTER — Other Ambulatory Visit: Payer: Self-pay | Admitting: Family Medicine

## 2013-06-12 ENCOUNTER — Encounter: Payer: Self-pay | Admitting: Family Medicine

## 2013-06-12 DIAGNOSIS — M47812 Spondylosis without myelopathy or radiculopathy, cervical region: Secondary | ICD-10-CM

## 2013-06-12 NOTE — Progress Notes (Signed)
Received OV note from [facility] orthopedics, Dr. Darrelyn Hillock from visit on 06/10/13. Problem list updated. Placed in scan box.

## 2013-07-01 ENCOUNTER — Other Ambulatory Visit (INDEPENDENT_AMBULATORY_CARE_PROVIDER_SITE_OTHER): Payer: Medicare Other

## 2013-07-01 DIAGNOSIS — I10 Essential (primary) hypertension: Secondary | ICD-10-CM

## 2013-07-01 LAB — BASIC METABOLIC PANEL
CO2: 30 mEq/L (ref 19–32)
Chloride: 105 mEq/L (ref 96–112)
Creatinine, Ser: 1 mg/dL (ref 0.4–1.5)
Glucose, Bld: 85 mg/dL (ref 70–99)

## 2013-07-01 LAB — LIPID PANEL
Cholesterol: 130 mg/dL (ref 0–200)
HDL: 33.8 mg/dL — ABNORMAL LOW (ref >39.00)
Total CHOL/HDL Ratio: 4
Triglycerides: 122 mg/dL (ref 0.0–149.0)

## 2013-07-02 NOTE — Progress Notes (Signed)
Quick Note:  Called and spoke with pt and pt is aware. ______ 

## 2013-07-09 ENCOUNTER — Encounter: Payer: Self-pay | Admitting: Family Medicine

## 2013-07-09 ENCOUNTER — Ambulatory Visit: Payer: Medicare Other | Admitting: Family Medicine

## 2013-07-09 ENCOUNTER — Ambulatory Visit (INDEPENDENT_AMBULATORY_CARE_PROVIDER_SITE_OTHER): Payer: Medicare Other | Admitting: Family Medicine

## 2013-07-09 VITALS — BP 120/80 | Temp 98.3°F | Wt 244.0 lb

## 2013-07-09 DIAGNOSIS — J449 Chronic obstructive pulmonary disease, unspecified: Secondary | ICD-10-CM

## 2013-07-09 DIAGNOSIS — K219 Gastro-esophageal reflux disease without esophagitis: Secondary | ICD-10-CM

## 2013-07-09 DIAGNOSIS — R7309 Other abnormal glucose: Secondary | ICD-10-CM

## 2013-07-09 DIAGNOSIS — I10 Essential (primary) hypertension: Secondary | ICD-10-CM

## 2013-07-09 DIAGNOSIS — R7303 Prediabetes: Secondary | ICD-10-CM

## 2013-07-09 DIAGNOSIS — E785 Hyperlipidemia, unspecified: Secondary | ICD-10-CM

## 2013-07-09 MED ORDER — LOSARTAN POTASSIUM-HCTZ 50-12.5 MG PO TABS: 0.5000 | ORAL_TABLET | Freq: Every day | ORAL | Status: DC

## 2013-07-09 NOTE — Progress Notes (Addendum)
Chief Complaint  Patient presents with  . 4 month follow up    HPI:  HTN/HLD/Prediabetes: -on losartan-HCTZ, now taking half tab -stable -denies: CP, change in SOB, swelling, palpitations -was going to work on cutting back on carbs and sweets last visit, labs done and look good - reviewed with pt -no regular exercise -has cut out alcohol  Dep/Anx: -followed by Dr. Evelene Croon -stable  GERD/Hernia/Hx bowel obstruction: -followed by Dr. Daphine Deutscher and Dickie La  BPH/hypogonadism/elevated PSA: Followed by Urology   COPD:  -followed by Dr. Delford Field -inhalers do not help -stable  Trigeminal Neuralgia -resolved -backing down on gabapentin as gums bleeding    ROS: See pertinent positives and negatives per HPI.  Past Medical History  Diagnosis Date  . Anxiety   . Depression   . GERD (gastroesophageal reflux disease)   . HTN (hypertension)   . Lung disease     peritonitis after surgery in 1999  . IBS (irritable bowel syndrome)   . Dyslipidemia   . Anemia, pernicious   . ACE-inhibitor cough   . Nephrolithiasis   . Allergic rhinitis   . Diverticulosis of colon   . Elevated PSA   . Hernia   . Peritonitis   . Hyperplastic colon polyp   . Rectal fissure   . Arthritis   . Depression   . Hypertension   . Hx of blood transfusion reaction   . Kidney stone   . Hyperlipidemia   . Rectal fissure   . Hyperparathyroidism   . Nephrolithiasis     Family History  Problem Relation Age of Onset  . Diabetes Mother   . Coronary artery disease Mother     CABG  . Diabetes Sister   . Diabetes Brother   . Hypertension Brother   . Colon cancer Neg Hx   . Esophageal cancer Neg Hx   . Stomach cancer Neg Hx   . Clotting disorder Brother     History   Social History  . Marital Status: Married    Spouse Name: N/A    Number of Children: 2  . Years of Education: N/A   Occupational History  . Disabled - psych    Social History Main Topics  . Smoking status: Former Smoker -- 1.00  packs/day for 20 years    Types: Cigarettes    Quit date: 11/06/1996  . Smokeless tobacco: Former Neurosurgeon    Types: Chew    Quit date: 11/07/1983  . Alcohol Use: No  . Drug Use: No  . Sexual Activity: None   Other Topics Concern  . None   Social History Narrative   Daily Caffeine Use:  Yes          Current outpatient prescriptions:ALPRAZolam (XANAX) 1 MG tablet, Take 1 mg by mouth at bedtime. , Disp: , Rfl: ;  losartan-hydrochlorothiazide (HYZAAR) 50-12.5 MG per tablet, Take 0.5 tablets by mouth daily., Disp: 45 tablet, Rfl: 3;  NEXIUM 40 MG capsule, TAKE ONE CAPSULE BY MOUTH EVERY DAY, Disp: 90 capsule, Rfl: 3 polyethylene glycol powder (GLYCOLAX/MIRALAX) powder, DISSOLVE 1 CAPFUL IN LIQUID EVERY DAY AS NEEDED FOR CONSTIPATION, Disp: 527 g, Rfl: 2;  albuterol (PROVENTIL HFA;VENTOLIN HFA) 108 (90 BASE) MCG/ACT inhaler, Inhale 2 puffs into the lungs every 6 (six) hours as needed for wheezing., Disp: 1 Inhaler, Rfl: 0;  omeprazole (PRILOSEC) 20 MG capsule, Take 20 mg by mouth daily., Disp: , Rfl:  sucralfate (CARAFATE) 1 G tablet, Take 1 g by mouth 4 (four) times daily., Disp: , Rfl: ;  [  DISCONTINUED] ferrous sulfate 325 (65 FE) MG tablet, Take 325 mg by mouth daily with breakfast.  , Disp: , Rfl: ;  [DISCONTINUED] losartan (COZAAR) 50 MG tablet, Take 1 tablet (50 mg total) by mouth daily., Disp: 30 tablet, Rfl: 6  EXAM:  Filed Vitals:   07/09/13 1004  BP: 120/80  Temp: 98.3 F (36.8 C)    Body mass index is 34.05 kg/(m^2).  GENERAL: vitals reviewed and listed above, alert, oriented, appears well hydrated and in no acute distress  HEENT: atraumatic, conjunttiva clear, no obvious abnormalities on inspection of external nose and ears  NECK: no obvious masses on inspection  LUNGS: clear to auscultation bilaterally, no wheezes, rales or rhonchi, good air movement  ABD: protuberant,hernia, soft, NTTP, normal BS  CV: HRRR, no peripheral edema  MS: moves all extremities without  noticeable abnormality  PSYCH: pleasant and cooperative, no obvious depression or anxiety  ASSESSMENT AND PLAN:  Discussed the following assessment and plan:  HYPERTENSION - Plan: losartan-hydrochlorothiazide (HYZAAR) 50-12.5 MG per tablet  Obstructive chronic bronchitis without exacerbation, followed by Dr. Delford Field  GERD followed by Dr. Juanda Chance  Prediabetes  Other and unspecified hyperlipidemia  -doing well -congratulated on lifestyle changes and improvements in labs - reviewed labs with him -changed/decreased BP med dosing and refilled -flu vaccine today, follow up in 4-6 months -Patient advised to return or notify a doctor immediately if symptoms worsen or persist or new concerns arise.  There are no Patient Instructions on file for this visit.   Kriste Basque R.

## 2013-07-22 ENCOUNTER — Encounter: Payer: Self-pay | Admitting: Internal Medicine

## 2013-12-19 ENCOUNTER — Other Ambulatory Visit: Payer: Self-pay | Admitting: Family Medicine

## 2014-01-06 ENCOUNTER — Ambulatory Visit (INDEPENDENT_AMBULATORY_CARE_PROVIDER_SITE_OTHER): Payer: Medicare Other | Admitting: Family Medicine

## 2014-01-06 ENCOUNTER — Encounter: Payer: Self-pay | Admitting: Family Medicine

## 2014-01-06 VITALS — BP 90/64 | Temp 97.9°F | Ht 70.25 in | Wt 225.0 lb

## 2014-01-06 DIAGNOSIS — Z0000 Encounter for general adult medical examination without abnormal findings: Secondary | ICD-10-CM

## 2014-01-06 DIAGNOSIS — N400 Enlarged prostate without lower urinary tract symptoms: Secondary | ICD-10-CM

## 2014-01-06 DIAGNOSIS — R7309 Other abnormal glucose: Secondary | ICD-10-CM

## 2014-01-06 DIAGNOSIS — F32A Depression, unspecified: Secondary | ICD-10-CM

## 2014-01-06 DIAGNOSIS — F341 Dysthymic disorder: Secondary | ICD-10-CM

## 2014-01-06 DIAGNOSIS — F419 Anxiety disorder, unspecified: Secondary | ICD-10-CM

## 2014-01-06 DIAGNOSIS — F329 Major depressive disorder, single episode, unspecified: Secondary | ICD-10-CM

## 2014-01-06 DIAGNOSIS — J449 Chronic obstructive pulmonary disease, unspecified: Secondary | ICD-10-CM

## 2014-01-06 DIAGNOSIS — I10 Essential (primary) hypertension: Secondary | ICD-10-CM

## 2014-01-06 DIAGNOSIS — J4489 Other specified chronic obstructive pulmonary disease: Secondary | ICD-10-CM

## 2014-01-06 DIAGNOSIS — R7303 Prediabetes: Secondary | ICD-10-CM

## 2014-01-06 DIAGNOSIS — K219 Gastro-esophageal reflux disease without esophagitis: Secondary | ICD-10-CM

## 2014-01-06 DIAGNOSIS — J309 Allergic rhinitis, unspecified: Secondary | ICD-10-CM

## 2014-01-06 LAB — BASIC METABOLIC PANEL
BUN: 15 mg/dL (ref 6–23)
CALCIUM: 9.4 mg/dL (ref 8.4–10.5)
CO2: 30 meq/L (ref 19–32)
CREATININE: 1 mg/dL (ref 0.4–1.5)
Chloride: 103 mEq/L (ref 96–112)
GFR: 83.47 mL/min (ref >60.00)
Glucose, Bld: 80 mg/dL (ref 70–99)
Potassium: 4 mEq/L (ref 3.5–5.1)
SODIUM: 140 meq/L (ref 135–145)

## 2014-01-06 LAB — LIPID PANEL
Cholesterol: 123 mg/dL (ref 0–200)
HDL: 42.3 mg/dL (ref >39.00)
LDL Cholesterol: 64 mg/dL (ref 0–99)
TRIGLYCERIDES: 82 mg/dL (ref 0.0–149.0)
Total CHOL/HDL Ratio: 3
VLDL: 16.4 mg/dL (ref 0.0–40.0)

## 2014-01-06 LAB — HEMOGLOBIN A1C: HEMOGLOBIN A1C: 5.4 % (ref 4.6–6.5)

## 2014-01-06 NOTE — Progress Notes (Signed)
Chief Complaint  Patient presents with  . Annual Exam    BLUE MEDICARE    HPI:  Here for annual exam:  Follow up:  1-4)HTN/HLD/Prediabetes/Obesity: -meds: losartan-HCTZ; reports -diet/lifestyle: cutting back on carbs and sweets - he used to eat a ton of sweets and drink many - has lost almost 20 lbs in 6 months -no regular exercise  -BP is low since weight loss and wants to try stopping bp med -feels great  5)Dep/Anx: -sees Dr. [name]  6)GERD/HH: -followed by GI  7)BPH/hypogonadism/elevated PSA: -followed by urology - sees Dr. Karsten Ro next month and will do PSA there  8) COPD -sees Dr. [name] for this  9)AR: -chronic allergies -takes allegra, used to get allergy shots but did not seem to help - not doing INS  HM: -UTD on colon ca screening -zostavax: is going to consider this -former smoker - quit in 22, Followed by Dr. [name]    ROS: See pertinent positives and negatives per HPI.  Past Medical History  Diagnosis Date  . Anxiety   . Depression   . GERD (gastroesophageal reflux disease)   . HTN (hypertension)   . Lung disease     peritonitis after surgery in 1999  . IBS (irritable bowel syndrome)   . Dyslipidemia   . Anemia, pernicious   . ACE-inhibitor cough   . Nephrolithiasis   . Allergic rhinitis   . Diverticulosis of colon   . Elevated PSA   . Hernia   . Peritonitis   . Hyperplastic colon polyp   . Rectal fissure   . Arthritis   . Depression   . Hypertension   . Hx of blood transfusion reaction   . Kidney stone   . Hyperlipidemia   . Rectal fissure   . Hyperparathyroidism   . Nephrolithiasis     Past Surgical History  Procedure Laterality Date  . Hand surgery  1973    Left  . Knee surgery  1997    Left  . Cholecystectomy  1981  . Abdominal surgery      Multiple  . Ventral hernia repair    . Nissen fundoplication  4098     complicated by gastric perforation, peritonitis, ventral nernia   . Lithotripsy      Right  . Brain  surgery  March 2012    Trigeminal nerve  . Upper gastrointestinal endoscopy    . Hernia repair      Family History  Problem Relation Age of Onset  . Diabetes Mother   . Coronary artery disease Mother     CABG  . Diabetes Sister   . Diabetes Brother   . Hypertension Brother   . Colon cancer Neg Hx   . Esophageal cancer Neg Hx   . Stomach cancer Neg Hx   . Clotting disorder Brother     History   Social History  . Marital Status: Married    Spouse Name: N/A    Number of Children: 2  . Years of Education: N/A   Occupational History  . Disabled - psych    Social History Main Topics  . Smoking status: Former Smoker -- 1.00 packs/day for 20 years    Types: Cigarettes    Quit date: 11/06/1996  . Smokeless tobacco: Former Systems developer    Types: [place] date: 11/07/1983  . Alcohol Use: No  . Drug Use: No  . Sexual Activity: None   Other Topics Concern  . None   Social History  Narrative   Daily Caffeine Use:  Yes          Current outpatient prescriptions:albuterol (PROVENTIL HFA;VENTOLIN HFA) 108 (90 BASE) MCG/ACT inhaler, Inhale 2 puffs into the lungs every 6 (six) hours as needed for wheezing., Disp: 1 Inhaler, Rfl: 0;  ALPRAZolam (XANAX) 1 MG tablet, Take 1 mg by mouth at bedtime. , Disp: , Rfl: ;  losartan-hydrochlorothiazide (HYZAAR) 50-12.5 MG per tablet, Take 0.5 tablets by mouth daily., Disp: 45 tablet, Rfl: 3 NEXIUM 40 MG capsule, TAKE ONE CAPSULE BY MOUTH EVERY DAY, Disp: 90 capsule, Rfl: 3;  omeprazole (PRILOSEC) 20 MG capsule, Take 20 mg by mouth daily., Disp: , Rfl: ;  polyethylene glycol powder (GLYCOLAX/MIRALAX) powder, DISSOLVE 1 CAPFUL IN LIQUID EVERY DAY AS NEEDED FOR CONSTIPATION, Disp: 527 g, Rfl: 2;  sucralfate (CARAFATE) 1 G tablet, Take 1 g by mouth 4 (four) times daily., Disp: , Rfl:  [DISCONTINUED] ferrous sulfate 325 (65 FE) MG tablet, Take 325 mg by mouth daily with breakfast.  , Disp: , Rfl: ;  [DISCONTINUED] losartan (COZAAR) 50 MG tablet, Take 1  tablet (50 mg total) by mouth daily., Disp: 30 tablet, Rfl: 6  EXAM:  Filed Vitals:   01/06/14 0816  BP: 90/64  Temp: 97.9 F (36.6 C)    Body mass index is 32.07 kg/(m^2).  GENERAL: vitals reviewed and listed above, alert, oriented, appears well hydrated and in no acute distress  HEENT: atraumatic, conjunttiva clear, no obvious abnormalities on inspection of external nose and ears  NECK: no obvious masses on inspection  LUNGS: clear to auscultation bilaterally, no wheezes, rales or rhonchi, good air movement  CV: HRRR, no peripheral edema  MS: moves all extremities without noticeable abnormality  PSYCH: pleasant and cooperative, no obvious depression or anxiety  ABD: soft, NTTP  ASSESSMENT AND PLAN:  Discussed the following assessment and plan:  Visit for preventive health examination - Plan: Lipid panel, Hemoglobin K0X, Basic metabolic panel, Hep C Antibody  Allergic rhinitis  HYPERTENSION - Plan: Basic metabolic panel  BPH (benign prostatic hyperplasia), followed by Dr. Karsten Ro  Obstructive chronic bronchitis without exacerbation, followed by Dr. [name]  GERD followed by Dr. Olevia Perches  Anxiety and depression, followed by Dr. Robina Ade in Psych  Prediabetes - Plan: Hemoglobin A1c  -start INS for allergic rhinitis -stop BP medicine and he is to return in 1 month to recheck BP -FASTING LABS -Level A and B USPSTF recs discussed including lung ca screening though has been 15 years since he last smoked and he opted against any screening for this -followed by urology for his PSA congratulated on weight loss -Patient advised to return or notify a doctor immediately if symptoms worsen or persist or new concerns arise.  Patient Instructions  -We have ordered labs or studies at this visit. It can take up to 1-2 weeks for results and processing. We will contact you with instructions IF your results are abnormal. Normal results will be released to your [facility]. If you have not  heard from [place] or can not find your results in [facility] in 2 weeks please contact our office.  -PLEASE SIGN UP FOR MYCHART TODAY   We recommend the following healthy lifestyle measures: - eat a healthy diet consisting of lots of vegetables, fruits, beans, nuts, seeds, healthy meats such as white chicken and fish and whole grains.  - avoid fried foods, fast food, processed foods, sodas, red meet and other fattening foods.  - get a least 150 minutes of aerobic exercise per  week.   Follow up in: 4 months or as needed      KIM, HANNAH R.

## 2014-01-06 NOTE — Progress Notes (Signed)
Pre visit review using our clinic review tool, if applicable. No additional management support is needed unless otherwise documented below in the visit note. 

## 2014-01-06 NOTE — Patient Instructions (Addendum)
-  We have ordered labs or studies at this visit. It can take up to 1-2 weeks for results and processing. We will contact you with instructions IF your results are abnormal. Normal results will be released to your [facility]. If you have not heard from [place] or can not find your results in [facility] in 2 weeks please contact our office.  -PLEASE SIGN UP FOR MYCHART TODAY   We recommend the following healthy lifestyle measures: - eat a healthy diet consisting of lots of vegetables, fruits, beans, nuts, seeds, healthy meats such as white chicken and fish and whole grains.  - avoid fried foods, fast food, processed foods, sodas, red meet and other fattening foods.  - get a least 150 minutes of aerobic exercise per week.   Follow up in: 1 month to recheck blood pressure

## 2014-01-07 ENCOUNTER — Telehealth: Payer: Self-pay | Admitting: Family Medicine

## 2014-01-07 LAB — HEPATITIS C ANTIBODY: HCV AB: NEGATIVE

## 2014-01-07 NOTE — Telephone Encounter (Signed)
Relevant patient education assigned to patient using Emmi. ° °

## 2014-01-16 ENCOUNTER — Other Ambulatory Visit: Payer: Self-pay | Admitting: Family Medicine

## 2014-02-10 ENCOUNTER — Ambulatory Visit: Payer: Medicare Other | Admitting: Family Medicine

## 2014-02-18 ENCOUNTER — Other Ambulatory Visit: Payer: Self-pay | Admitting: Family Medicine

## 2014-03-17 ENCOUNTER — Other Ambulatory Visit: Payer: Self-pay | Admitting: Family Medicine

## 2014-03-18 ENCOUNTER — Other Ambulatory Visit: Payer: Self-pay | Admitting: Family Medicine

## 2014-05-05 ENCOUNTER — Other Ambulatory Visit: Payer: Self-pay | Admitting: Family Medicine

## 2014-05-07 NOTE — Telephone Encounter (Signed)
Re-fill request was received for the below medication

## 2014-05-11 ENCOUNTER — Ambulatory Visit: Payer: Medicare Other | Admitting: Family Medicine

## 2014-05-12 ENCOUNTER — Other Ambulatory Visit: Payer: Self-pay | Admitting: Family Medicine

## 2014-07-08 ENCOUNTER — Ambulatory Visit: Payer: Medicare Other | Admitting: Family Medicine

## 2014-08-25 ENCOUNTER — Encounter: Payer: Medicare Other | Admitting: Family Medicine

## 2014-08-25 NOTE — Progress Notes (Signed)
error    This encounter was created in error - please disregard.

## 2014-09-22 ENCOUNTER — Encounter: Payer: Self-pay | Admitting: Family Medicine

## 2014-09-22 ENCOUNTER — Ambulatory Visit (INDEPENDENT_AMBULATORY_CARE_PROVIDER_SITE_OTHER): Payer: Medicare Other | Admitting: Family Medicine

## 2014-09-22 VITALS — BP 120/82 | HR 57 | Temp 97.6°F | Ht 70.25 in | Wt 234.1 lb

## 2014-09-22 DIAGNOSIS — M47812 Spondylosis without myelopathy or radiculopathy, cervical region: Secondary | ICD-10-CM

## 2014-09-22 DIAGNOSIS — K449 Diaphragmatic hernia without obstruction or gangrene: Secondary | ICD-10-CM

## 2014-09-22 DIAGNOSIS — F32A Depression, unspecified: Secondary | ICD-10-CM

## 2014-09-22 DIAGNOSIS — F419 Anxiety disorder, unspecified: Secondary | ICD-10-CM

## 2014-09-22 DIAGNOSIS — J449 Chronic obstructive pulmonary disease, unspecified: Secondary | ICD-10-CM

## 2014-09-22 DIAGNOSIS — G500 Trigeminal neuralgia: Secondary | ICD-10-CM

## 2014-09-22 DIAGNOSIS — N400 Enlarged prostate without lower urinary tract symptoms: Secondary | ICD-10-CM

## 2014-09-22 DIAGNOSIS — F329 Major depressive disorder, single episode, unspecified: Secondary | ICD-10-CM

## 2014-09-22 DIAGNOSIS — K219 Gastro-esophageal reflux disease without esophagitis: Secondary | ICD-10-CM

## 2014-09-22 DIAGNOSIS — F418 Other specified anxiety disorders: Secondary | ICD-10-CM

## 2014-09-22 DIAGNOSIS — I10 Essential (primary) hypertension: Secondary | ICD-10-CM

## 2014-09-22 MED ORDER — LOSARTAN POTASSIUM-HCTZ 50-12.5 MG PO TABS: ORAL_TABLET | ORAL | Status: DC

## 2014-09-22 MED ORDER — ESOMEPRAZOLE MAGNESIUM 40 MG PO CPDR: 40.0000 mg | DELAYED_RELEASE_CAPSULE | Freq: Every day | ORAL | Status: DC

## 2014-09-22 NOTE — Progress Notes (Signed)
HPI:  1-4)HTN/HLD/Prediabetes/Obesity: -meds: losartan-HCTZ - taking 1/2 tab -diet/lifestyle: cutting back on carbs and sweets - he used to eat a ton of sweets  -no regular exercise   5)Dep/Anx: -sees Dr. [name] -meds: xanax  6)GERD/HH: -followed by GI -meds: PPI, carafate -denies: heartburn, dysphagia  7)BPH/hypogonadism/elevated PSA: -followed by urology - sees Dr. Karsten Ro next month and will do PSA there  8) COPD -sees Dr. [name] for this  9)AR: -chronic allergies -takes allegra, used to get allergy shots but did not seem to help - not doing INS  7)Trigeminal neuralgia: -seeing [facility], Dr. Vertell Limber -had surgery in the past, but symptoms have returned and are unfortunately persistent -sees Dr. Vertell Limber later this week to discuss options  HM: -UTD on colon ca screening -zostavax: is going to consider this -former smoker - quit in 77, Followed by Dr. [name]    ROS: See pertinent positives and negatives per HPI.  Past Medical History  Diagnosis Date  . Anxiety   . Depression   . GERD (gastroesophageal reflux disease)   . HTN (hypertension)   . Lung disease     peritonitis after surgery in 1999  . IBS (irritable bowel syndrome)   . Dyslipidemia   . Anemia, pernicious   . ACE-inhibitor cough   . Nephrolithiasis   . Allergic rhinitis   . Diverticulosis of colon   . Elevated PSA   . Hernia   . Peritonitis   . Hyperplastic colon polyp   . Rectal fissure   . Arthritis   . Depression   . Hypertension   . Hx of blood transfusion reaction   . Kidney stone   . Hyperlipidemia   . Rectal fissure   . Hyperparathyroidism   . Nephrolithiasis     Past Surgical History  Procedure Laterality Date  . Hand surgery  1973    Left  . Knee surgery  1997    Left  . Cholecystectomy  1981  . Abdominal surgery      Multiple  . Ventral hernia repair    . Nissen fundoplication  6384     complicated by gastric perforation, peritonitis, ventral nernia   . Lithotripsy     Right  . Brain surgery  March 2012    Trigeminal nerve  . Upper gastrointestinal endoscopy    . Hernia repair      Family History  Problem Relation Age of Onset  . Diabetes Mother   . Coronary artery disease Mother     CABG  . Diabetes Sister   . Diabetes Brother   . Hypertension Brother   . Colon cancer Neg Hx   . Esophageal cancer Neg Hx   . Stomach cancer Neg Hx   . Clotting disorder Brother     History   Social History  . Marital Status: Married    Spouse Name: N/A    Number of Children: 2  . Years of Education: N/A   Occupational History  . Disabled - psych    Social History Main Topics  . Smoking status: Former Smoker -- 1.00 packs/day for 20 years    Types: Cigarettes    Quit date: 11/06/1996  . Smokeless tobacco: Former Systems developer    Types: [place] date: 11/07/1983  . Alcohol Use: No  . Drug Use: No  . Sexual Activity: None   Other Topics Concern  . None   Social History Narrative   Daily Caffeine Use:  Yes  Current outpatient prescriptions: ALPRAZolam (XANAX) 1 MG tablet, Take 1 mg by mouth at bedtime. , Disp: , Rfl: ;  esomeprazole (NEXIUM) 40 MG capsule, Take 1 capsule (40 mg total) by mouth daily., Disp: 30 capsule, Rfl: 3;  gabapentin (NEURONTIN) 400 MG capsule, Take 400 mg by mouth. Take 6 once a day, Disp: , Rfl: ;  losartan-hydrochlorothiazide (HYZAAR) 50-12.5 MG per tablet, Take 0.5 tablet once a day, Disp: 45 tablet, Rfl: 0 omeprazole (PRILOSEC) 20 MG capsule, Take 20 mg by mouth daily., Disp: , Rfl: ;  polyethylene glycol powder (GLYCOLAX/MIRALAX) powder, DISSOLVE 1 CAPFUL IN LIQUID EVERY DAY AS NEEDED FOR CONSTIPATION, Disp: 527 g, Rfl: 2;  sucralfate (CARAFATE) 1 G tablet, Take 1 g by mouth 4 (four) times daily., Disp: , Rfl: ;  [DISCONTINUED] ferrous sulfate 325 (65 FE) MG tablet, Take 325 mg by mouth daily with breakfast.  , Disp: , Rfl:  [DISCONTINUED] losartan (COZAAR) 50 MG tablet, Take 1 tablet (50 mg total) by mouth daily.,  Disp: 30 tablet, Rfl: 6  EXAM:  Filed Vitals:   09/22/14 0830  BP: 120/82  Pulse: 57  Temp: 97.6 F (36.4 C)    Body mass index is 33.36 kg/(m^2).  GENERAL: vitals reviewed and listed above, alert, oriented, appears well hydrated and in no acute distress  HEENT: atraumatic, conjunttiva clear, no obvious abnormalities on inspection of external nose and ears  NECK: no obvious masses on inspection  LUNGS: clear to auscultation bilaterally, no wheezes, rales or rhonchi, good air movement  CV: HRRR, no peripheral edema  MS: moves all extremities without noticeable abnormality  PSYCH: pleasant and cooperative, no obvious depression or anxiety  ASSESSMENT AND PLAN:  Discussed the following assessment and plan:  Essential hypertension  Gastroesophageal reflux disease, esophagitis presence not specified  Trigeminal neuralgia  BPH (benign prostatic hyperplasia), followed by Dr. Karsten Ro  Obstructive chronic bronchitis without exacerbation, followed by Dr. [name]  Anxiety and depression, followed by Dr. Robina Ade in Psych  Cervical spondylosis, treated by Dr. Gladstone Lighter, [facility]  Hiatal hernia, followed by Dr. Hassell Done ([place]) and Dr. Olevia Perches (GI)  -refilled medications -advised healthy diet and regular exercise -BP good on reduced dose of antihypertensive - refilled -Patient advised to return or notify a doctor immediately if symptoms worsen or persist or new concerns arise.  There are no Patient Instructions on file for this visit.   [name] R.

## 2014-09-22 NOTE — Progress Notes (Signed)
Pre visit review using our clinic review tool, if applicable. No additional management support is needed unless otherwise documented below in the visit note. 

## 2014-10-28 ENCOUNTER — Other Ambulatory Visit (HOSPITAL_COMMUNITY): Payer: Self-pay | Admitting: Neurosurgery

## 2014-11-11 ENCOUNTER — Encounter (HOSPITAL_COMMUNITY)
Admission: RE | Admit: 2014-11-11 | Discharge: 2014-11-11 | Disposition: A | Discharge status: Home / Self Care | Payer: Medicare Other | Source: Ambulatory Visit | Attending: Neurosurgery | Admitting: Neurosurgery

## 2014-11-11 ENCOUNTER — Encounter (HOSPITAL_COMMUNITY): Payer: Self-pay

## 2014-11-11 DIAGNOSIS — G500 Trigeminal neuralgia: Secondary | ICD-10-CM | POA: Diagnosis not present

## 2014-11-11 DIAGNOSIS — F329 Major depressive disorder, single episode, unspecified: Secondary | ICD-10-CM | POA: Diagnosis not present

## 2014-11-11 DIAGNOSIS — D510 Vitamin B12 deficiency anemia due to intrinsic factor deficiency: Secondary | ICD-10-CM | POA: Diagnosis not present

## 2014-11-11 DIAGNOSIS — Z6833 Body mass index (BMI) 33.0-33.9, adult: Secondary | ICD-10-CM | POA: Diagnosis not present

## 2014-11-11 DIAGNOSIS — I10 Essential (primary) hypertension: Secondary | ICD-10-CM | POA: Diagnosis not present

## 2014-11-11 DIAGNOSIS — F419 Anxiety disorder, unspecified: Secondary | ICD-10-CM | POA: Diagnosis not present

## 2014-11-11 DIAGNOSIS — K219 Gastro-esophageal reflux disease without esophagitis: Secondary | ICD-10-CM | POA: Diagnosis not present

## 2014-11-11 DIAGNOSIS — Z87891 Personal history of nicotine dependence: Secondary | ICD-10-CM | POA: Diagnosis not present

## 2014-11-11 DIAGNOSIS — K589 Irritable bowel syndrome without diarrhea: Secondary | ICD-10-CM | POA: Diagnosis not present

## 2014-11-11 DIAGNOSIS — E785 Hyperlipidemia, unspecified: Secondary | ICD-10-CM | POA: Diagnosis not present

## 2014-11-11 HISTORY — DX: Nausea with vomiting, unspecified: R11.2

## 2014-11-11 HISTORY — DX: Other specified postprocedural states: Z98.890

## 2014-11-11 LAB — CBC
HEMATOCRIT: 46 % (ref 39.0–52.0)
HEMOGLOBIN: 16.2 g/dL (ref 13.0–17.0)
MCH: 30.3 pg (ref 26.0–34.0)
MCHC: 35.2 g/dL (ref 30.0–36.0)
MCV: 86.1 fL (ref 78.0–100.0)
Platelets: 135 10*3/uL — ABNORMAL LOW (ref 150–400)
RBC: 5.34 MIL/uL (ref 4.22–5.81)
RDW: 12.9 % (ref 11.5–15.5)
WBC: 8.2 10*3/uL (ref 4.0–10.5)

## 2014-11-11 LAB — BASIC METABOLIC PANEL
ANION GAP: 11 (ref 5–15)
BUN: 10 mg/dL (ref 6–23)
CALCIUM: 9.5 mg/dL (ref 8.4–10.5)
CHLORIDE: 98 meq/L (ref 96–112)
CO2: 30 mmol/L (ref 19–32)
CREATININE: 0.99 mg/dL (ref 0.50–1.35)
GFR calc non Af Amer: 86 mL/min — ABNORMAL LOW (ref >90)
Glucose, Bld: 100 mg/dL — ABNORMAL HIGH (ref 70–99)
Potassium: 4.4 mmol/L (ref 3.5–5.1)
SODIUM: 139 mmol/L (ref 135–145)

## 2014-11-11 NOTE — Pre-Procedure Instructions (Signed)
[name]  11/11/2014    Your procedure is scheduled on:  Tuesday, Jan. 12  Report to [facility] Main Entrance "A" at 8:30 AM.  Call this number if you have problems the morning of surgery: [phone]               If any questions prior to surgery date call pre-admission [phone] (betweem 8:00am -4:00pm)   Remember:   Do not eat food or drink liquids after midnight.   Take these medicines the morning of surgery with A SIP OF WATER: tylenol if needed, xanax if needed, tegretol, nexium , gabapentin (neurontin) carafate if needed.              Stop taking aspirin, coumadin, plavix,or herbal medicines. Stop any NSAIDS such as aleve, ibuprofen,    Do not wear jewelry, make-up or nail polish.  Do not wear lotions, powders, or perfumes. You may wear deodorant.  Do not shave 48 hours prior to surgery. Men may shave face and neck.  Do not bring valuables to the hospital.  [facility] is not responsible  for any belongings or valuables.               Contacts, dentures or bridgework may not be worn into surgery.  Leave suitcase in the car. After surgery it may be brought to your room.  For patients admitted to the hospital, discharge time is determined by your                treatment team.               Patients discharged the day of surgery will not be allowed to drive  home.  Name and phone number of your driver:  Special Instructions:  Review preparing for surgery handout   Please read over the following fact sheets that you were given: Pain Booklet, Coughing and Deep Breathing and Surgical Site Infection Prevention

## 2014-11-11 NOTE — Progress Notes (Addendum)
No cardiologist.  PCP: Dr. Maudie Mercury  Pt. Reports he has had pain in his chest off and on  since 2012.  Has seen dr. Loanne Drilling in the past and last stress test 2012.  States the doctors aren't sure what it's from. Last pain was this past summer. Pt. States he will take a xanax or tums or carafate  and it resolves.

## 2014-11-11 NOTE — Progress Notes (Signed)
Anesthesia PAT Evaluation: Patient is a 64 year old male scheduled for right V2 and V3 rhysolysis on 11/17/14 by Dr. Vertell Limber. Diagnosis trigeminal neuralgia.   History includes former smoker, anxiety, depression, HTN, GERD, IBS, dyslipidemia, pernicious anemia, depression, question of hyperparathyroidism (patient denies, although reports Calcium was elevated at some point but not recently), nephrolithiasis, blood transfusion reaction, Nissen fundoplication complicated by gastric perforation and peritonitis with emergency laparotomy with prolonged hospitalization '99, recurrent VHR, brain surgery '12 for trigeminal neuralgia.  PCP is Dr. Colin Benton.   Meds include Xanax, Tegretol, Nexium, Neurontin, Hyzaar, Carafate, Miralax.  I saw patient at PAT due to reports of chronic intermittent chest pain since 1999 following his prolonged hospitalization following gastric perforation.  He has never been referred to a cardiologist although he has had previous cardiac testing (see below) which has been negative for cardiac etiology.  He feels 5/10 "pain" across his lower chest that can last for 1-2 days.  He takes extra Prilosec and Carafate and eventually pain subsides.  No associated SOB, palpitations, nausea, diaphoresis, jaw or arm pain.  Pain is not associated with activity and when present does not worsen with activity.  It occurs every few months without specific pattern. As mentioned, it has occurred since 1999, and he has not noticed a change in their quality or frequency.  He has chronic fatigue and low testosterone for years--no change in his energy level . No SOB or edema.  His mother had CAD in her 36's. He has no known CAD, CHF, or DM history.  He was able to do yard work until ~ 07/2014 when he had recurrent trigeminal neuralgia symptoms.  He has since had to take a lot of pain medications and has not been able to drive or be very active.   11/11/14 EKG: NSR.  ETT 08/29/11: Fair exercise tolerance, 7METS.  Normal BP response to exercise. No ST/T changes to suggest ischemia. No angina during ETT. ETT terminated early due to patient's desire to stop.   He had a normal stress nuclear study, EF 62%, normal wall motion on 03/18/09.   Preoperative labs noted. Ca is WNL.  Glucose 100. Cr 0.99.  H/H 16.2/46.0. PLT count 135K.  Patient without new CV symptoms and previous negative cardiology evaluation. EKG is WNL.  If no acute changes then I would anticipate that he could proceed as planned.  George Hugh [facility] Short Stay Center/Anesthesiology Phone [phone] 11/11/2014 4:23 PM

## 2014-11-16 MED ORDER — CEFAZOLIN SODIUM-DEXTROSE 2-3 GM-% IV SOLR
2.0000 g | INTRAVENOUS | Status: AC
  Administered 2014-11-17: 2 g via INTRAVENOUS
  Filled 2014-11-16: qty 50

## 2014-11-16 NOTE — Anesthesia Preprocedure Evaluation (Addendum)
Anesthesia Evaluation  Patient identified by MRN, date of birth, ID band Patient awake    Reviewed: Allergy & Precautions, NPO status , Patient's Chart, lab work & pertinent test results, reviewed documented beta blocker date and time   History of Anesthesia Complications (+) PONV  Airway Mallampati: III   Neck ROM: Full    Dental  (+) Teeth Intact   Pulmonary COPDformer smoker,  SP trach and trach revision breath sounds clear to auscultation        Cardiovascular Exercise Tolerance: Good hypertension, Pt. on medications Rhythm:Regular  Neg stress 2010, ef 60%   Neuro/Psych Trigeminal neuralgia    GI/Hepatic GERD-  Medicated,  Endo/Other    Renal/GU      Musculoskeletal   Abdominal (+) + obese,   Peds  Hematology 16/46 H/H   Anesthesia Other Findings   Reproductive/Obstetrics                          Anesthesia Physical Anesthesia Plan  ASA: II  Anesthesia Plan: General   Post-op Pain Management:    Induction: Intravenous  Airway Management Planned: Oral ETT  Additional Equipment:   Intra-op Plan:   Post-operative Plan: Extubation in OR  Informed Consent: I have reviewed the patients History and Physical, chart, labs and discussed the procedure including the risks, benefits and alternatives for the proposed anesthesia with the patient or authorized representative who has indicated his/her understanding and acceptance.     Plan Discussed with:   Anesthesia Plan Comments: (SP TRAC and trac revision, no symptoms of stenosis, anterior and small mouth may want glide available if intubation needed)       Anesthesia Quick Evaluation

## 2014-11-17 ENCOUNTER — Ambulatory Visit (HOSPITAL_COMMUNITY): Payer: Medicare Other

## 2014-11-17 ENCOUNTER — Encounter (HOSPITAL_COMMUNITY)
Admission: RE | Disposition: A | Discharge status: Home / Self Care | Payer: Self-pay | Source: Ambulatory Visit | Attending: Neurosurgery

## 2014-11-17 ENCOUNTER — Ambulatory Visit (HOSPITAL_COMMUNITY): Payer: Medicare Other | Admitting: Anesthesiology

## 2014-11-17 ENCOUNTER — Ambulatory Visit (HOSPITAL_COMMUNITY): Payer: Medicare Other | Admitting: Vascular Surgery

## 2014-11-17 ENCOUNTER — Ambulatory Visit (HOSPITAL_COMMUNITY)
Admission: RE | Admit: 2014-11-17 | Discharge: 2014-11-17 | Disposition: A | Discharge status: Home / Self Care | Payer: Medicare Other | Source: Ambulatory Visit | Attending: Neurosurgery | Admitting: Neurosurgery

## 2014-11-17 ENCOUNTER — Encounter (HOSPITAL_COMMUNITY): Payer: Self-pay | Admitting: *Deleted

## 2014-11-17 DIAGNOSIS — Z0389 Encounter for observation for other suspected diseases and conditions ruled out: Secondary | ICD-10-CM | POA: Diagnosis not present

## 2014-11-17 DIAGNOSIS — Z87891 Personal history of nicotine dependence: Secondary | ICD-10-CM | POA: Diagnosis not present

## 2014-11-17 DIAGNOSIS — F329 Major depressive disorder, single episode, unspecified: Secondary | ICD-10-CM | POA: Diagnosis not present

## 2014-11-17 DIAGNOSIS — F419 Anxiety disorder, unspecified: Secondary | ICD-10-CM | POA: Insufficient documentation

## 2014-11-17 DIAGNOSIS — Z6833 Body mass index (BMI) 33.0-33.9, adult: Secondary | ICD-10-CM | POA: Insufficient documentation

## 2014-11-17 DIAGNOSIS — D510 Vitamin B12 deficiency anemia due to intrinsic factor deficiency: Secondary | ICD-10-CM | POA: Insufficient documentation

## 2014-11-17 DIAGNOSIS — Z419 Encounter for procedure for purposes other than remedying health state, unspecified: Secondary | ICD-10-CM

## 2014-11-17 DIAGNOSIS — G500 Trigeminal neuralgia: Secondary | ICD-10-CM | POA: Diagnosis not present

## 2014-11-17 DIAGNOSIS — E785 Hyperlipidemia, unspecified: Secondary | ICD-10-CM | POA: Insufficient documentation

## 2014-11-17 DIAGNOSIS — K219 Gastro-esophageal reflux disease without esophagitis: Secondary | ICD-10-CM | POA: Diagnosis not present

## 2014-11-17 DIAGNOSIS — K589 Irritable bowel syndrome without diarrhea: Secondary | ICD-10-CM | POA: Diagnosis not present

## 2014-11-17 DIAGNOSIS — I10 Essential (primary) hypertension: Secondary | ICD-10-CM | POA: Insufficient documentation

## 2014-11-17 HISTORY — PX: RHIZOTOMY: SHX2355

## 2014-11-17 SURGERY — RHIZOTOMY
Anesthesia: General | Site: Face | Laterality: Right

## 2014-11-17 MED ORDER — LIDOCAINE HCL (CARDIAC) 20 MG/ML IV SOLN
INTRAVENOUS | Status: DC | PRN
  Administered 2014-11-17: 20 mg via INTRAVENOUS

## 2014-11-17 MED ORDER — FENTANYL CITRATE 0.05 MG/ML IJ SOLN
INTRAMUSCULAR | Status: AC
  Filled 2014-11-17: qty 5

## 2014-11-17 MED ORDER — MEPERIDINE HCL 25 MG/ML IJ SOLN: 6.2500 mg | INTRAMUSCULAR | Status: DC | PRN

## 2014-11-17 MED ORDER — MIDAZOLAM HCL 5 MG/5ML IJ SOLN
INTRAMUSCULAR | Status: DC | PRN
  Administered 2014-11-17 (×2): 1 mg via INTRAVENOUS

## 2014-11-17 MED ORDER — PROPOFOL 10 MG/ML IV BOLUS
INTRAVENOUS | Status: DC | PRN
  Administered 2014-11-17: 30 mg via INTRAVENOUS
  Administered 2014-11-17 (×3): 20 mg via INTRAVENOUS

## 2014-11-17 MED ORDER — FENTANYL CITRATE 0.05 MG/ML IJ SOLN
25.0000 ug | INTRAMUSCULAR | Status: DC | PRN
Start: 2014-11-17 — End: 2014-11-17

## 2014-11-17 MED ORDER — GLYCOPYRROLATE 0.2 MG/ML IJ SOLN
INTRAMUSCULAR | Status: DC | PRN
  Administered 2014-11-17: 0.2 mg via INTRAVENOUS

## 2014-11-17 MED ORDER — SCOPOLAMINE 1 MG/3DAYS TD PT72
1.0000 | MEDICATED_PATCH | Freq: Once | TRANSDERMAL | Status: AC
  Administered 2014-11-17: 1 via TRANSDERMAL
  Administered 2014-11-17: 1.5 mg via TRANSDERMAL
  Filled 2014-11-17: qty 1

## 2014-11-17 MED ORDER — LIDOCAINE HCL (PF) 1 % IJ SOLN
INTRAMUSCULAR | Status: DC | PRN
  Administered 2014-11-17: 5 mL

## 2014-11-17 MED ORDER — FENTANYL CITRATE 0.05 MG/ML IJ SOLN
INTRAMUSCULAR | Status: DC | PRN
Start: 2014-11-17 — End: 2014-11-17
  Administered 2014-11-17 (×2): 50 ug via INTRAVENOUS

## 2014-11-17 MED ORDER — PROPOFOL 10 MG/ML IV BOLUS
INTRAVENOUS | Status: AC
  Filled 2014-11-17: qty 20

## 2014-11-17 MED ORDER — LACTATED RINGERS IV SOLN
INTRAVENOUS | Status: DC | PRN
  Administered 2014-11-17: 07:00:00 via INTRAVENOUS

## 2014-11-17 MED ORDER — MIDAZOLAM HCL 2 MG/2ML IJ SOLN
INTRAMUSCULAR | Status: AC
  Filled 2014-11-17: qty 2

## 2014-11-17 MED ORDER — PROMETHAZINE HCL 25 MG/ML IJ SOLN: 6.2500 mg | INTRAMUSCULAR | Status: DC | PRN

## 2014-11-17 MED ORDER — 0.9 % SODIUM CHLORIDE (POUR BTL) OPTIME
TOPICAL | Status: DC | PRN
  Administered 2014-11-17: 1000 mL

## 2014-11-17 SURGICAL SUPPLY — 31 items
BANDAGE ADH SHEER 1  50/CT (GAUZE/BANDAGES/DRESSINGS) IMPLANT
BENZOIN TINCTURE PRP APPL 2/3 (GAUZE/BANDAGES/DRESSINGS) IMPLANT
BNDG COHESIVE 2X5 WHT NS (GAUZE/BANDAGES/DRESSINGS) ×2 IMPLANT
CANNULA 100 22GAX2.5ML (MISCELLANEOUS) ×2 IMPLANT
COVER BACK TABLE 60X90IN (DRAPES) ×2 IMPLANT
DECANTER SPIKE VIAL GLASS SM (MISCELLANEOUS) ×2 IMPLANT
DRAPE PROXIMA HALF (DRAPES) ×2 IMPLANT
GAUZE SPONGE 4X4 16PLY XRAY LF (GAUZE/BANDAGES/DRESSINGS) ×2 IMPLANT
GLOVE BIO SURGEON STRL SZ8 (GLOVE) ×6 IMPLANT
GLOVE BIOGEL PI IND STRL 8 (GLOVE) ×1 IMPLANT
GLOVE BIOGEL PI INDICATOR 8 (GLOVE) ×1
GLOVE ECLIPSE 7.5 STRL STRAW (GLOVE) ×2 IMPLANT
GLOVE EXAM NITRILE LRG STRL (GLOVE) IMPLANT
GLOVE EXAM NITRILE MD LF STRL (GLOVE) IMPLANT
GLOVE EXAM NITRILE XL STR (GLOVE) IMPLANT
GLOVE EXAM NITRILE XS STR PU (GLOVE) IMPLANT
GOWN STRL REUS W/ TWL LRG LVL3 (GOWN DISPOSABLE) IMPLANT
GOWN STRL REUS W/ TWL XL LVL3 (GOWN DISPOSABLE) IMPLANT
GOWN STRL REUS W/TWL 2XL LVL3 (GOWN DISPOSABLE) ×2 IMPLANT
GOWN STRL REUS W/TWL LRG LVL3 (GOWN DISPOSABLE)
GOWN STRL REUS W/TWL XL LVL3 (GOWN DISPOSABLE)
KIT BASIN OR (CUSTOM PROCEDURE TRAY) ×2 IMPLANT
KIT ROOM TURNOVER OR (KITS) ×2 IMPLANT
MARKER SKIN DUAL TIP RULER LAB (MISCELLANEOUS) ×2 IMPLANT
NEEDLE HYPO 25X1 1.5 SAFETY (NEEDLE) ×2 IMPLANT
NS IRRIG 1000ML POUR BTL (IV SOLUTION) ×2 IMPLANT
PAD ARMBOARD 7.5X6 YLW CONV (MISCELLANEOUS) ×4 IMPLANT
SAFETY PINS ×2 IMPLANT
STRIP CLOSURE SKIN 1/2X4 (GAUZE/BANDAGES/DRESSINGS) IMPLANT
SYR CONTROL 10ML LL (SYRINGE) ×2 IMPLANT
WATER STERILE IRR 1000ML POUR (IV SOLUTION) ×2 IMPLANT

## 2014-11-17 NOTE — Brief Op Note (Signed)
11/17/2014  8:37 AM  PATIENT:  [name]  64 y.o. male  PRE-OPERATIVE DIAGNOSIS:  trigeminal neuralgia Right V2 and V3  POST-OPERATIVE DIAGNOSIS:  trigeminal neuralgia Right V2 and V3  PROCEDURE:  Procedure(s) with comments: RHIZOTOMY TO RIGHT V2,V3 (Right) - right   SURGEON:  Surgeon(s) and Role:    * Erline Levine, MD - Primary    * Consuella Lose, MD - Assisting  PHYSICIAN ASSISTANT:   ASSISTANTS: none   ANESTHESIA:   IV sedation  EBL:     BLOOD ADMINISTERED:none  DRAINS: none   LOCAL MEDICATIONS USED:  LIDOCAINE   SPECIMEN:  No Specimen  DISPOSITION OF SPECIMEN:  N/A  COUNTS:  YES  TOURNIQUET:  * No tourniquets in log *  DICTATION: Indications:  Patient is 64 year old man with medically refractory right trigeminal neuralgia who did well after MVD but now has return of trigeminal neuralgia.  He presents for percutaneous radiofrequency rhizolysis.  Procedure: Patient was brought to OR and given IV sedation with versed and fentanyl.  Right face was prepped with betadine.  Skin and subcutaneous cheek was infiltrated with lidocaine to a depth of 1.5 inches.  5 mm exposed tip 22 gauge needle was inserted into Foramen Ovale and positioning was confirmed on AP and lateral fluoroscopy with one hand in the patient's mouth to confirm no penetration of the oral mucosa.  The needle tip was placed in the medial half of the Foramen.  Depth of needle position was confirmed with lateral fluoroscopy.  With electrical stimulation, patient had facial pain at .11V at 1 ms and 50 Hz in a V 2 distribution.  He had jaw twitching at 0.22V at 5 Hz and 1 ms.  Patient was then given IV sedation with propofol and then a lesion was made to 60 degrees centigrade for 60 seconds.  While the patient was asleep, the needle was pulled back 3 mm and an additional lesion was created at a similar temperature and duration.  The needle was then withdrawn and a sterile dressing was placed.  The  patient was taken to recovery having tolerated his procedure well.  PLAN OF CARE: Discharge to home after PACU  PATIENT DISPOSITION:  PACU - hemodynamically stable.   Delay start of Pharmacological VTE agent (>24hrs) due to surgical blood loss or risk of bleeding: yes

## 2014-11-17 NOTE — Interval H&P Note (Signed)
History and Physical Interval Note:  11/17/2014 7:30 AM  [name]  has presented today for surgery, with the diagnosis of trigeminal neuralgia  The various methods of treatment have been discussed with the patient and family. After consideration of risks, benefits and other options for treatment, the patient has consented to  Procedure(s) with comments: RHIZOTOMY (Right) - Right V2 and V3 Rhysolysis as a surgical intervention .  The patient's history has been reviewed, patient examined, no change in status, stable for surgery.  I have reviewed the patient's chart and labs.  Questions were answered to the patient's satisfaction.     [name] D

## 2014-11-17 NOTE — H&P (Signed)
[place], [place] [address] Phone: [phone]   Patient ID:   [id number] Patient: [name]  Date of Birth: 1951/10/18 Visit Type: Office Visit   Date: 10/28/2014 10:00 AM Provider: Marchia Meiers. Vertell Limber MD   This 64 year old male presents for Follow Up of Trigeminal neuralgia.  History of Present Illness: 1.  Follow Up of Trigeminal neuralgia  [name] returns reporting some improvement since stopping his Tegretol.  He recalls the Tegretol was begun for increased facial pain not treated successfully with 3200 mg of Neurontin per day.  Unfortunately, the Tegretol caused increased headache frequent falls and lethargy.  Since stopping the Tegretol he is more alert and his headache has subsided. Right-sided facial numbness persists.  He reports 3 tics yesterday with some facial pain.  He did increase his Neurontin to a total dose of 2800 mg on yesterday. He returns today to discuss his options.  Patient on examination complains of right V2 and V3 distribution trigeminal neuralgia.  We are going to try to gradually increase the gabapentin up to 3200 mg and we'll start him on some Dilantin as well as 100 mg 3 times daily.  I do not believe he is able to tolerate Tegretol well.  He's had a lot of fuzziness since he's been on the Tegretol.  We talked about treatment options and I recommended consideration for radiofrequency rhizolysis of V2 and V3 distribution trigeminal neuralgia on the right.  The patient is interested in doing this.  We will see how he does with medications and then proceed with radiofrequency lesioning if he does not make improvements before 12 January.      Medical/Surgical/Interim History Reviewed, no change.  Last detailed document date:08/10/2014.   PAST MEDICAL HISTORY, SURGICAL HISTORY, FAMILY HISTORY, SOCIAL HISTORY AND REVIEW OF SYSTEMS I have reviewed the patient's past medical, surgical, family and social history  as well as the comprehensive review of systems as included on the [facility] history form dated, which I have signed.  Family History: Reviewed, no changes.  Last detailed document: 08/10/2014.   Social History: Tobacco use reviewed. Reviewed, no changes. Last detailed document date: 08/10/2014.      MEDICATIONS(added, continued or stopped this visit):   Started Medication Directions Instruction Stopped  10/28/2014 Dilantin Extended 100 mg capsule take 1 capsule by oral route 3 times every day    09/22/2014 Neurontin 400 mg capsule take 2 capsule by oral route 4 times every day     Nexium 40 mg capsule,delayed release take 1 capsule by oral route  every day    10/20/2014 Tegretol 200 mg tablet take 1 tablet by oral route  BID     Xanax 1 mg tablet take 1 tablet by oral route 3 times every day      ALLERGIES:  Ingredient Reaction Medication Name Comment  NO KNOWN ALLERGIES     No known allergies.    Vitals Date Temp F BP Pulse Ht In Wt Lb BMI BSA Pain Score  10/28/2014  129/80 72 71 238 33.19  0/10        IMPRESSION Right V2 and V3 distribution trigeminal neuralgia  Completed Orders (this encounter) Order Details Reason Side Interpretation Result Initial Treatment Date Region  Lifestyle education regarding diet Encouraged to eat a well balanced diet and follow up with primary care physician.         Assessment/Plan # Detail Type Description   1. Assessment Trigeminal neuralgia (  G50.0).       2. Assessment Body mass index (BMI) 33.0-33.9, adult (A70.14).   Plan Orders Today's instructions / counseling include(s) Lifestyle education regarding diet.         Pain Assessment/Treatment Pain Scale: 0/10. Method: Numeric Pain Intensity Scale. Location: face. Onset: 07/10/2014. Duration: varies. Quality: discomforting. Pain Assessment/Treatment follow-up plan of care: Patient is taking medications as prescribed..  I have adjusted  medications and if he does not make significant improvements then we will proceed with radiofrequency rhizolysis of V2 and V3 on the right on 17 November 2014.  Risks and benefits were discussed with the patient and he wishes to proceed.  Orders: Diagnostic Procedures: Assessment Procedure  G50.0 Right V2 and V3 trigeminal rhysolysis  Instruction(s)/Education: Assessment Instruction  [phone] Lifestyle education regarding diet    MEDICATIONS PRESCRIBED TODAY    Rx Quantity Refills  DILANTIN 100 mg  90 3            Provider:  Marchia Meiers. Vertell Limber MD  11/05/2014 02:27 PM Dictation edited by: Marchia Meiers. Vertell Limber    CC Providers: Erline Levine MD [address] [place], [place] [address]         ----------------------------------------------------------------------------------------------------------------------------------------------------------------------         Electronically signed by Marchia Meiers. Vertell Limber MD on 11/05/2014 02:27 PM

## 2014-11-17 NOTE — Discharge Instructions (Signed)
What to eat: ° °For your first meals, you should eat lightly; only small meals initially.  If you do not have nausea, you may eat larger meals.  Avoid spicy, greasy and heavy food.   ° °General Anesthesia, Adult, Care After  °Refer to this sheet in the next few weeks. These instructions provide you with information on caring for yourself after your procedure. Your health care provider may also give you more specific instructions. Your treatment has been planned according to current medical practices, but problems sometimes occur. Call your health care provider if you have any problems or questions after your procedure.  °WHAT TO EXPECT AFTER THE PROCEDURE  °After the procedure, it is typical to experience:  °Sleepiness.  °Nausea and vomiting. °HOME CARE INSTRUCTIONS  °For the first 24 hours after general anesthesia:  °Have a responsible person with you.  °Do not drive a car. If you are alone, do not take public transportation.  °Do not drink alcohol.  °Do not take medicine that has not been prescribed by your health care provider.  °Do not sign important papers or make important decisions.  °You may resume a normal diet and activities as directed by your health care provider.  °Change bandages (dressings) as directed.  °If you have questions or problems that seem related to general anesthesia, call the hospital and ask for the anesthetist or anesthesiologist on call. °SEEK MEDICAL CARE IF:  °You have nausea and vomiting that continue the day after anesthesia.  °You develop a rash. °SEEK IMMEDIATE MEDICAL CARE IF:  °You have difficulty breathing.  °You have chest pain.  °You have any allergic problems. °Document Released: 01/29/2001 Document Revised: 06/25/2013 Document Reviewed: 05/08/2013  °ExitCare® Patient Information ©2014 [employer].  ° ° °Laceration Care, Adult  ° ° °A laceration is a cut that goes through all layers of the skin. The cut goes into the tissue beneath the skin.  °HOME CARE  °For stitches  (sutures) or staples:  °Keep the cut clean and dry.  °If you have a bandage (dressing), change it at least once a day. Change the bandage if it gets wet or dirty, or as told by your doctor.  °Wash the cut with soap and water 2 times a day. Rinse the cut with water. Pat it dry with a clean towel.  °Put a thin layer of medicated cream on the cut as told by your doctor.  °You may shower after the first 24 hours. Do not soak the cut in water until the stitches are removed.  °Only take medicines as told by your doctor.  °Have your stitches or staples removed as told by your doctor. °For skin adhesive strips:  °Keep the cut clean and dry.  °Do not get the strips wet. You may take a bath, but be careful to keep the cut dry.  °If the cut gets wet, pat it dry with a clean towel.  °The strips will fall off on their own. Do not remove the strips that are still stuck to the cut. °For wound glue:  °You may shower or take baths. Do not soak or scrub the cut. Do not swim. Avoid heavy sweating until the glue falls off on its own. After a shower or bath, pat the cut dry with a clean towel.  °Do not put medicine on your cut until the glue falls off.  °If you have a bandage, do not put tape over the glue.  °Avoid lots of sunlight or tanning lamps until   the glue falls off. Put sunscreen on the cut for the first year to reduce your scar.  °The glue will fall off on its own. Do not pick at the glue. °You may need a tetanus shot if:  °You cannot remember when you had your last tetanus shot.  °You have never had a tetanus shot. °If you need a tetanus shot and you choose not to have one, you may get tetanus. Sickness from tetanus can be serious.  °GET HELP RIGHT AWAY IF:  °Your pain does not get better with medicine.  °Your arm, hand, leg, or foot loses feeling (numbness) or changes color.  °Your cut is bleeding.  °Your joint feels weak, or you cannot use your joint.  °You have painful lumps on your body.  °Your cut is red, puffy (swollen),  or painful.  °You have a red line on the skin near the cut.  °You have yellowish-white fluid (pus) coming from the cut.  °You have a fever.  °You have a bad smell coming from the cut or bandage.  °Your cut breaks open before or after stitches are removed.  °You notice something coming out of the cut, such as wood or glass.  °You cannot move a finger or toe. °MAKE SURE YOU:  °Understand these instructions.  °Will watch your condition.  °Will get help right away if you are not doing well or get worse. °Document Released: 04/10/2008 Document Revised: 01/15/2012 Document Reviewed: 04/18/2011  °ExitCare® Patient Information ©2014 [employer].  ° ° °

## 2014-11-17 NOTE — Progress Notes (Signed)
Awake, alert, conversant.  Face symmetric.  EOMI.  Mild hypalgesia to pin in V 2 distribution on the right.  Patient doing well.

## 2014-11-17 NOTE — Anesthesia Postprocedure Evaluation (Signed)
  Anesthesia Post-op Note  Patient: [name]  Procedure(s) Performed: Procedure(s) with comments: RHIZOTOMY TO RIGHT V2,V3 (Right) - right  Patient Location: PACU  Anesthesia Type:MAC  Level of Consciousness: awake, alert  and oriented  Airway and Oxygen Therapy: Patient Spontanous Breathing and Patient connected to nasal cannula oxygen  Post-op Pain: none  Post-op Assessment: Post-op Vital signs reviewed, Patient's Cardiovascular Status Stable, Respiratory Function Stable and Patent Airway  Post-op Vital Signs: Reviewed and stable  Last Vitals:  Filed Vitals:   11/17/14 0619  BP: 154/85  Pulse:   Temp:   Resp:     Complications: No apparent anesthesia complications

## 2014-11-17 NOTE — Op Note (Signed)
11/17/2014  8:37 AM  PATIENT: [name] 64 y.o. male  PRE-OPERATIVE DIAGNOSIS: trigeminal neuralgia Right V2 and V3  POST-OPERATIVE DIAGNOSIS: trigeminal neuralgia Right V2 and V3  PROCEDURE: Procedure(s) with comments:  RHIZOTOMY TO RIGHT V2,V3 (Right) - right  SURGEON: Surgeon(s) and Role:  * Erline Levine, MD - Primary  * Consuella Lose, MD - Assisting  PHYSICIAN ASSISTANT:  ASSISTANTS: none  ANESTHESIA: IV sedation  EBL:  BLOOD ADMINISTERED:none  DRAINS: none  LOCAL MEDICATIONS USED: LIDOCAINE  SPECIMEN: No Specimen  DISPOSITION OF SPECIMEN: N/A  COUNTS: YES  TOURNIQUET: * No tourniquets in log *  DICTATION: Indications: Patient is 64 year old man with medically refractory right trigeminal neuralgia who did well after MVD but now has return of trigeminal neuralgia. He presents for percutaneous radiofrequency rhizolysis.  Procedure: Patient was brought to OR and given IV sedation with versed and fentanyl. Right face was prepped with betadine. Skin and subcutaneous cheek was infiltrated with lidocaine to a depth of 1.5 inches. 5 mm exposed tip 22 gauge needle was inserted into Foramen Ovale and positioning was confirmed on AP and lateral fluoroscopy with one hand in the patient's mouth to confirm no penetration of the oral mucosa. The needle tip was placed in the medial half of the Foramen. Depth of needle position was confirmed with lateral fluoroscopy. With electrical stimulation, patient had facial pain at .11V at 1 ms and 50 Hz in a V 2 distribution. He had jaw twitching at 0.22V at 5 Hz and 1 ms. Patient was then given IV sedation with propofol and then a lesion was made to 60 degrees centigrade for 60 seconds. While the patient was asleep, the needle was pulled back 3 mm and an additional lesion was created at a similar temperature and duration. The needle was then withdrawn and a sterile dressing was placed. The patient was taken to recovery having tolerated his procedure  well.  PLAN OF CARE: Discharge to home after PACU  PATIENT DISPOSITION: PACU - hemodynamically stable.  Delay start of Pharmacological VTE agent (>24hrs) due to surgical blood loss or risk of bleeding: yes

## 2014-11-17 NOTE — Transfer of Care (Signed)
Immediate Anesthesia Transfer of Care Note  Patient: [name]  Procedure(s) Performed: Procedure(s) with comments: RHIZOTOMY TO RIGHT V2,V3 (Right) - right  Patient Location: PACU  Anesthesia Type:MAC  Level of Consciousness: awake, alert , oriented and sedated  Airway & Oxygen Therapy: Patient Spontanous Breathing and Patient connected to nasal cannula oxygen  Post-op Assessment: Report given to PACU RN, Post -op Vital signs reviewed and stable and Patient moving all extremities  Post vital signs: Reviewed and stable  Complications: No apparent anesthesia complications

## 2014-11-18 DIAGNOSIS — R338 Other retention of urine: Secondary | ICD-10-CM | POA: Diagnosis not present

## 2014-11-19 NOTE — Progress Notes (Signed)
Patient could not pass urine, had to go to urologist to get catheterized.

## 2014-11-23 ENCOUNTER — Encounter (HOSPITAL_COMMUNITY): Payer: Self-pay | Admitting: Neurosurgery

## 2014-11-25 DIAGNOSIS — R338 Other retention of urine: Secondary | ICD-10-CM | POA: Diagnosis not present

## 2014-11-30 DIAGNOSIS — N419 Inflammatory disease of prostate, unspecified: Secondary | ICD-10-CM | POA: Diagnosis not present

## 2014-11-30 DIAGNOSIS — N390 Urinary tract infection, site not specified: Secondary | ICD-10-CM | POA: Diagnosis not present

## 2014-12-01 DIAGNOSIS — L219 Seborrheic dermatitis, unspecified: Secondary | ICD-10-CM | POA: Diagnosis not present

## 2014-12-01 DIAGNOSIS — L719 Rosacea, unspecified: Secondary | ICD-10-CM | POA: Diagnosis not present

## 2014-12-09 ENCOUNTER — Other Ambulatory Visit: Payer: Self-pay | Admitting: Family Medicine

## 2014-12-14 ENCOUNTER — Other Ambulatory Visit: Payer: Self-pay | Admitting: Neurosurgery

## 2014-12-17 ENCOUNTER — Encounter (HOSPITAL_COMMUNITY): Payer: Self-pay

## 2014-12-17 ENCOUNTER — Encounter (HOSPITAL_COMMUNITY)
Admission: RE | Admit: 2014-12-17 | Discharge: 2014-12-17 | Disposition: A | Discharge status: Home / Self Care | Payer: Medicare Other | Source: Ambulatory Visit | Attending: Neurosurgery | Admitting: Neurosurgery

## 2014-12-17 DIAGNOSIS — R300 Dysuria: Secondary | ICD-10-CM | POA: Diagnosis not present

## 2014-12-17 DIAGNOSIS — Z79899 Other long term (current) drug therapy: Secondary | ICD-10-CM | POA: Insufficient documentation

## 2014-12-17 DIAGNOSIS — N390 Urinary tract infection, site not specified: Secondary | ICD-10-CM | POA: Diagnosis not present

## 2014-12-17 DIAGNOSIS — N419 Inflammatory disease of prostate, unspecified: Secondary | ICD-10-CM | POA: Diagnosis not present

## 2014-12-17 DIAGNOSIS — R339 Retention of urine, unspecified: Secondary | ICD-10-CM | POA: Diagnosis not present

## 2014-12-17 DIAGNOSIS — Z01812 Encounter for preprocedural laboratory examination: Secondary | ICD-10-CM | POA: Diagnosis not present

## 2014-12-17 DIAGNOSIS — R3912 Poor urinary stream: Secondary | ICD-10-CM | POA: Diagnosis not present

## 2014-12-17 DIAGNOSIS — G500 Trigeminal neuralgia: Secondary | ICD-10-CM | POA: Insufficient documentation

## 2014-12-17 HISTORY — DX: Ventral hernia without obstruction or gangrene: K43.9

## 2014-12-17 HISTORY — DX: Infection and inflammatory reaction due to indwelling urethral catheter, initial encounter: T83.511A

## 2014-12-17 HISTORY — DX: Urinary tract infection, site not specified: N39.0

## 2014-12-17 LAB — BASIC METABOLIC PANEL
Anion gap: 8 (ref 5–15)
BUN: 11 mg/dL (ref 6–23)
CHLORIDE: 97 mmol/L (ref 96–112)
CO2: 28 mmol/L (ref 19–32)
Calcium: 8.8 mg/dL (ref 8.4–10.5)
Creatinine, Ser: 0.89 mg/dL (ref 0.50–1.35)
GFR calc Af Amer: >90 mL/min (ref >90)
GFR, EST NON AFRICAN AMERICAN: 90 mL/min — AB (ref >90)
Glucose, Bld: 115 mg/dL — ABNORMAL HIGH (ref 70–99)
Potassium: 3.5 mmol/L (ref 3.5–5.1)
Sodium: 133 mmol/L — ABNORMAL LOW (ref 135–145)

## 2014-12-17 LAB — CBC
HCT: 41.8 % (ref 39.0–52.0)
Hemoglobin: 14.9 g/dL (ref 13.0–17.0)
MCH: 30.4 pg (ref 26.0–34.0)
MCHC: 35.6 g/dL (ref 30.0–36.0)
MCV: 85.3 fL (ref 78.0–100.0)
Platelets: 147 10*3/uL — ABNORMAL LOW (ref 150–400)
RBC: 4.9 MIL/uL (ref 4.22–5.81)
RDW: 12.9 % (ref 11.5–15.5)
WBC: 11.4 10*3/uL — AB (ref 4.0–10.5)

## 2014-12-17 NOTE — Pre-Procedure Instructions (Signed)
[name]  12/17/2014   Your procedure is scheduled on:  Tuesday, Feb. 16 at 2:40 pm  Report to [place] at 30 AM.  Call this number if you have problems the morning of surgery: [phone].              If you have any questions prior to surgery, call [phone] between  8 am and 4 pm Mon-Fri.   Remember:   Do not eat food or drink liquids after midnight. Monday night.   Take these medicines the morning of surgery with A SIP OF WATER: gabapentin,Tegretol, Nexium, Flomax, new antibiotics for urinary infection.   Do not wear jewelry.  Do not wear lotions, powders, or perfumes. You may wear deodorant.  Do not shave 48 hours prior to surgery. Men may shave face and neck.  Do not bring valuables to the hospital.  [facility] is not responsible   for any belongings or valuables.               Contacts, dentures or bridgework may not be worn into surgery.  Leave suitcase in the car. After surgery it may be brought to your room.  For patients admitted to the hospital, discharge time is determined by your  treatment team.               Patients discharged the day of surgery will not be allowed to drive  home.  Name and phone number of your driver: [name]; [phone]  Special Instructions: [facility] - Preparing for Surgery  Before surgery, you can play an important role.  Because skin is not sterile, your skin needs to be as free of germs as possible.  You can reduce the number of germs on you skin by washing with CHG (chlorahexidine gluconate) soap before surgery.  CHG is an antiseptic cleaner which kills germs and bonds with the skin to continue killing germs even after washing.  Please DO NOT use if you have an allergy to CHG or antibacterial soaps.  If your skin becomes reddened/irritated stop using the CHG and inform your nurse when you arrive at Short Stay.  Do not shave (including legs and underarms) for at least 48 hours prior to the  first CHG shower.  You may shave your face.  Please follow these instructions carefully:   1.  Shower with CHG Soap the night before surgery and the  morning of Surgery.  2.  If you choose to wash your hair, wash your hair first as usual with your   normal shampoo.  3.  After you shampoo, rinse your hair and body thoroughly to remove the  Shampoo.  4.  Use CHG as you would any other liquid soap.  You can apply chg directly  to the skin and wash gently with scrungie or a clean washcloth.  5.  Apply the CHG Soap to your body ONLY FROM THE NECK DOWN.   Do not use on open wounds or open sores.  Avoid contact with your eyes,   ears, mouth and genitals (private parts).  Wash genitals (private parts)   with your normal soap.  6.  Wash thoroughly, paying special attention to the area where your surgery   will be performed.  7.  Thoroughly rinse your body with warm water from the neck down.  8.  DO NOT shower/wash with your normal soap after using and rinsing off   the CHG Soap.  9.  Pat yourself  dry with a clean towel.            10.  Wear clean pajamas.            11.  Place clean sheets on your bed the night of your first shower and do not  sleep with pets.  Day of Surgery  Do not apply any lotions/deoderants the morning of surgery.  Please wear clean clothes to the hospital/surgery center.      Please read over the following fact sheets that you were given: Pain Booklet, Coughing and Deep Breathing and Surgical Site Infection Prevention

## 2014-12-17 NOTE — Progress Notes (Signed)
   12/17/14 1531  OBSTRUCTIVE SLEEP APNEA  Have you ever been diagnosed with sleep apnea through a sleep study? No (Had a negative sleep study 3 years ago)  Do you snore loudly (loud enough to be heard through closed doors)?  0  Do you often feel tired, fatigued, or sleepy during the daytime? 0  Has anyone observed you stop breathing during your sleep? 0  Do you have, or are you being treated for high blood pressure? 1  BMI more than 35 kg/m2? 0  Age over 72 years old? 1  Neck circumference greater than 40 cm/16 inches? 1 (18 inches)  Gender: 1  Obstructive Sleep Apnea Score 4

## 2014-12-21 MED ORDER — CEFAZOLIN SODIUM-DEXTROSE 2-3 GM-% IV SOLR
2.0000 g | INTRAVENOUS | Status: AC
Start: 2014-12-22 — End: 2014-12-22
  Administered 2014-12-22: 2 g via INTRAVENOUS
  Filled 2014-12-21: qty 50

## 2014-12-22 ENCOUNTER — Ambulatory Visit (HOSPITAL_COMMUNITY)
Admission: RE | Admit: 2014-12-22 | Discharge: 2014-12-22 | Disposition: A | Discharge status: Home / Self Care | Payer: Medicare Other | Source: Ambulatory Visit | Attending: Neurosurgery | Admitting: Neurosurgery

## 2014-12-22 ENCOUNTER — Encounter (HOSPITAL_COMMUNITY)
Admission: RE | Disposition: A | Discharge status: Home / Self Care | Payer: Self-pay | Source: Ambulatory Visit | Attending: Neurosurgery

## 2014-12-22 ENCOUNTER — Ambulatory Visit (HOSPITAL_COMMUNITY): Payer: Medicare Other | Admitting: Certified Registered Nurse Anesthetist

## 2014-12-22 ENCOUNTER — Ambulatory Visit (HOSPITAL_COMMUNITY): Payer: Medicare Other

## 2014-12-22 ENCOUNTER — Encounter (HOSPITAL_COMMUNITY): Payer: Self-pay | Admitting: *Deleted

## 2014-12-22 DIAGNOSIS — Z0389 Encounter for observation for other suspected diseases and conditions ruled out: Secondary | ICD-10-CM | POA: Diagnosis not present

## 2014-12-22 DIAGNOSIS — Z87891 Personal history of nicotine dependence: Secondary | ICD-10-CM | POA: Insufficient documentation

## 2014-12-22 DIAGNOSIS — K589 Irritable bowel syndrome without diarrhea: Secondary | ICD-10-CM | POA: Diagnosis not present

## 2014-12-22 DIAGNOSIS — K219 Gastro-esophageal reflux disease without esophagitis: Secondary | ICD-10-CM | POA: Diagnosis not present

## 2014-12-22 DIAGNOSIS — M1990 Unspecified osteoarthritis, unspecified site: Secondary | ICD-10-CM | POA: Diagnosis not present

## 2014-12-22 DIAGNOSIS — G500 Trigeminal neuralgia: Secondary | ICD-10-CM | POA: Insufficient documentation

## 2014-12-22 HISTORY — PX: RHIZOTOMY: SHX2355

## 2014-12-22 SURGERY — RHIZOTOMY
Anesthesia: Monitor Anesthesia Care | Site: Face | Laterality: Right

## 2014-12-22 MED ORDER — PROPOFOL 10 MG/ML IV BOLUS
INTRAVENOUS | Status: DC | PRN
  Administered 2014-12-22: 60 mg via INTRAVENOUS
  Administered 2014-12-22 (×2): 20 mg via INTRAVENOUS

## 2014-12-22 MED ORDER — MIDAZOLAM HCL 2 MG/2ML IJ SOLN
INTRAMUSCULAR | Status: AC
  Filled 2014-12-22: qty 2

## 2014-12-22 MED ORDER — SCOPOLAMINE 1 MG/3DAYS TD PT72
MEDICATED_PATCH | TRANSDERMAL | Status: AC
  Administered 2014-12-22: 1 via TRANSDERMAL
  Filled 2014-12-22: qty 1

## 2014-12-22 MED ORDER — ONDANSETRON HCL 4 MG/2ML IJ SOLN
INTRAMUSCULAR | Status: AC
  Filled 2014-12-22: qty 2

## 2014-12-22 MED ORDER — FENTANYL CITRATE 0.05 MG/ML IJ SOLN
INTRAMUSCULAR | Status: DC | PRN
  Administered 2014-12-22 (×2): 25 ug via INTRAVENOUS
  Administered 2014-12-22: 50 ug via INTRAVENOUS

## 2014-12-22 MED ORDER — GABAPENTIN 400 MG PO CAPS
800.0000 mg | ORAL_CAPSULE | Freq: Four times a day (QID) | ORAL | Status: DC
  Administered 2014-12-22: 800 mg via ORAL
  Filled 2014-12-22 (×3): qty 2

## 2014-12-22 MED ORDER — MIDAZOLAM HCL 5 MG/5ML IJ SOLN
INTRAMUSCULAR | Status: DC | PRN
  Administered 2014-12-22 (×2): 1 mg via INTRAVENOUS

## 2014-12-22 MED ORDER — PROPOFOL 10 MG/ML IV BOLUS
INTRAVENOUS | Status: AC
  Filled 2014-12-22: qty 20

## 2014-12-22 MED ORDER — PHENYLEPHRINE 40 MCG/ML (10ML) SYRINGE FOR IV PUSH (FOR BLOOD PRESSURE SUPPORT)
PREFILLED_SYRINGE | INTRAVENOUS | Status: AC
  Filled 2014-12-22: qty 20

## 2014-12-22 MED ORDER — LIDOCAINE HCL (PF) 1 % IJ SOLN
INTRAMUSCULAR | Status: DC | PRN
  Administered 2014-12-22: 6 mL

## 2014-12-22 MED ORDER — ONDANSETRON HCL 4 MG/2ML IJ SOLN
INTRAMUSCULAR | Status: DC | PRN
  Administered 2014-12-22: 4 mg via INTRAVENOUS

## 2014-12-22 MED ORDER — HYDROMORPHONE HCL 1 MG/ML IJ SOLN: 0.2500 mg | INTRAMUSCULAR | Status: DC | PRN

## 2014-12-22 MED ORDER — LACTATED RINGERS IV SOLN
INTRAVENOUS | Status: DC
  Administered 2014-12-22: 12:00:00 via INTRAVENOUS

## 2014-12-22 MED ORDER — LACTATED RINGERS IV SOLN
INTRAVENOUS | Status: DC | PRN
  Administered 2014-12-22: 14:00:00 via INTRAVENOUS

## 2014-12-22 MED ORDER — FENTANYL CITRATE 0.05 MG/ML IJ SOLN
INTRAMUSCULAR | Status: AC
  Filled 2014-12-22: qty 5

## 2014-12-22 SURGICAL SUPPLY — 27 items
BANDAGE ADH SHEER 1  50/CT (GAUZE/BANDAGES/DRESSINGS) ×2 IMPLANT
BENZOIN TINCTURE PRP APPL 2/3 (GAUZE/BANDAGES/DRESSINGS) ×2 IMPLANT
BNDG COHESIVE 3X5 WHT NS (GAUZE/BANDAGES/DRESSINGS) ×2 IMPLANT
CANNULA 100 22GAX2.5ML (MISCELLANEOUS) ×2 IMPLANT
COVER BACK TABLE 60X90IN (DRAPES) ×2 IMPLANT
DECANTER SPIKE VIAL GLASS SM (MISCELLANEOUS) ×2 IMPLANT
DRAPE PROXIMA HALF (DRAPES) ×2 IMPLANT
GAUZE SPONGE 4X4 16PLY XRAY LF (GAUZE/BANDAGES/DRESSINGS) ×2 IMPLANT
GLOVE BIO SURGEON STRL SZ8 (GLOVE) ×4 IMPLANT
GLOVE EXAM NITRILE LRG STRL (GLOVE) IMPLANT
GLOVE EXAM NITRILE MD LF STRL (GLOVE) IMPLANT
GLOVE EXAM NITRILE XL STR (GLOVE) IMPLANT
GLOVE EXAM NITRILE XS STR PU (GLOVE) IMPLANT
GOWN STRL REUS W/ TWL LRG LVL3 (GOWN DISPOSABLE) IMPLANT
GOWN STRL REUS W/ TWL XL LVL3 (GOWN DISPOSABLE) IMPLANT
GOWN STRL REUS W/TWL 2XL LVL3 (GOWN DISPOSABLE) IMPLANT
GOWN STRL REUS W/TWL LRG LVL3 (GOWN DISPOSABLE)
GOWN STRL REUS W/TWL XL LVL3 (GOWN DISPOSABLE)
KIT BASIN OR (CUSTOM PROCEDURE TRAY) ×2 IMPLANT
KIT ROOM TURNOVER OR (KITS) ×2 IMPLANT
MARKER SKIN DUAL TIP RULER LAB (MISCELLANEOUS) ×2 IMPLANT
NEEDLE HYPO 25X1 1.5 SAFETY (NEEDLE) ×2 IMPLANT
NS IRRIG 1000ML POUR BTL (IV SOLUTION) ×2 IMPLANT
PAD ARMBOARD 7.5X6 YLW CONV (MISCELLANEOUS) ×2 IMPLANT
STRIP CLOSURE SKIN 1/2X4 (GAUZE/BANDAGES/DRESSINGS) ×2 IMPLANT
SYR CONTROL 10ML LL (SYRINGE) ×2 IMPLANT
WATER STERILE IRR 1000ML POUR (IV SOLUTION) ×2 IMPLANT

## 2014-12-22 NOTE — Transfer of Care (Signed)
Immediate Anesthesia Transfer of Care Note  Patient: Cashtyn Pouliot Grauberger  Procedure(s) Performed: Procedure(s) with comments: Right V2 and V3 trigeminal rhysolysis (Right) - Right V2 and V3 trigeminal rhysolysis  Patient Location: PACU  Anesthesia Type:MAC  Level of Consciousness: awake, alert  and oriented  Airway & Oxygen Therapy: Patient Spontanous Breathing and Patient connected to nasal cannula oxygen  Post-op Assessment: Report given to RN, Post -op Vital signs reviewed and stable and Patient moving all extremities X 4  Post vital signs: Reviewed and stable  Last Vitals:  Filed Vitals:   12/22/14 1108  BP: 165/87  Pulse: 69  Temp: 36.4 C  Resp: 20    Complications: No apparent anesthesia complications

## 2014-12-22 NOTE — Brief Op Note (Signed)
12/22/2014  2:50 PM  PATIENT:  [name]  64 y.o. male  PRE-OPERATIVE DIAGNOSIS:  Trigeminal neuralgia  POST-OPERATIVE DIAGNOSIS:  Trigeminal neuralgia  PROCEDURE:  Procedure(s) with comments: Right V2 and V3 trigeminal rhysolysis (Right) - Right V2 and V3 trigeminal rhysolysis  SURGEON:  Surgeon(s) and Role:    * Erline Levine, MD - Primary    * Consuella Lose, MD - Assisting  PHYSICIAN ASSISTANT:   ASSISTANTS: none   ANESTHESIA:   IV sedation  EBL:     BLOOD ADMINISTERED:none  DRAINS: none   LOCAL MEDICATIONS USED:  LIDOCAINE   SPECIMEN:  No Specimen  DISPOSITION OF SPECIMEN:  N/A  COUNTS:  YES  TOURNIQUET:  * No tourniquets in log *  DICTATION: DICTATION: Indications:  Patient is 64 year old man with medically refractory right trigeminal neuralgia who did well after MVD but now has return of trigeminal neuralgia.  He presents for repeat percutaneous radiofrequency rhizolysis as he had relief after previous procedure but his pain has returned.  Procedure: Patient was brought to OR and given IV sedation with versed and fentanyl.  Right face was prepped with betadine.  Skin and subcutaneous cheek was infiltrated with lidocaine to a depth of 1.5 inches.  5 mm exposed tip 22 gauge needle was inserted into Foramen Ovale and positioning was confirmed on AP and lateral fluoroscopy with one hand in the patient's mouth to confirm no penetration of the oral mucosa.  The needle tip was placed in the medial half of the Foramen.  Depth of needle position was confirmed with lateral fluoroscopy.  With electrical stimulation, patient had facial pain at .09V at 1 ms and 50 Hz in a V 2 distribution.  He had no jaw twitching at 0.22V at 5 Hz and 1 ms.  Patient was then given IV sedation with propofol and then a lesion was made to 65 degrees centigrade for 90 seconds.  While the patient was asleep, the needle was pulled back 3 mm and an additional lesion was created at a similar  temperature and duration and a third lesion was made after removing the needle an additional 3 mm.  The patient had facial flushing in a V 2 distribution with this procedure.  The needle was then withdrawn and a sterile dressing was placed.  The patient was taken to recovery having tolerated his procedure well.  He had some numbness to pinprick in right V 2 and V 3 distribution.   PLAN OF CARE: Discharge to home after PACU  PATIENT DISPOSITION:  PACU - hemodynamically stable.   Delay start of Pharmacological VTE agent (>24hrs) due to surgical blood loss or risk of bleeding: yes

## 2014-12-22 NOTE — Discharge Instructions (Signed)
Wound Care Leave incision open to air. You may shower. Do not scrub directly on incision.  Do not put any creams, lotions, or ointments on incision.  Diet Resume your normal diet.  Return to Work Will be discussed at you follow up appointment. Call Your Doctor If Any of These Occur Redness, drainage, or swelling at the wound.  Temperature greater than 101 degrees. Severe pain not relieved by pain medication. Increased difficulty swallowing. Incision starts to come apart. Follow Up Appt Call  [phone]) or for problems.    Laceration Care, Adult    A laceration is a cut that goes through all layers of the skin. The cut goes into the tissue beneath the skin.  HOME CARE  For stitches (sutures) or staples:  Keep the cut clean and dry.  If you have a bandage (dressing), change it at least once a day. Change the bandage if it gets wet or dirty, or as told by your doctor.  Wash the cut with soap and water 2 times a day. Rinse the cut with water. Pat it dry with a clean towel.  Put a thin layer of medicated cream on the cut as told by your doctor.  You may shower after the first 24 hours. Do not soak the cut in water until the stitches are removed.  Only take medicines as told by your doctor.  Have your stitches or staples removed as told by your doctor. For skin adhesive strips:  Keep the cut clean and dry.  Do not get the strips wet. You may take a bath, but be careful to keep the cut dry.  If the cut gets wet, pat it dry with a clean towel.  The strips will fall off on their own. Do not remove the strips that are still stuck to the cut. For wound glue:  You may shower or take baths. Do not soak or scrub the cut. Do not swim. Avoid heavy sweating until the glue falls off on its own. After a shower or bath, pat the cut dry with a clean towel.  Do not put medicine on your cut until the glue falls off.  If you have a bandage, do not put tape over the glue.  Avoid lots of sunlight or  tanning lamps until the glue falls off. Put sunscreen on the cut for the first year to reduce your scar.  The glue will fall off on its own. Do not pick at the glue. You may need a tetanus shot if:  You cannot remember when you had your last tetanus shot.  You have never had a tetanus shot. If you need a tetanus shot and you choose not to have one, you may get tetanus. Sickness from tetanus can be serious.  GET HELP RIGHT AWAY IF:  Your pain does not get better with medicine.  Your arm, hand, leg, or foot loses feeling (numbness) or changes color.  Your cut is bleeding.  Your joint feels weak, or you cannot use your joint.  You have painful lumps on your body.  Your cut is red, puffy (swollen), or painful.  You have a red line on the skin near the cut.  You have yellowish-white fluid (pus) coming from the cut.  You have a fever.  You have a bad smell coming from the cut or bandage.  Your cut breaks open before or after stitches are removed.  You notice something coming out of the cut, such as wood or glass.  You cannot move a finger or toe. MAKE SURE YOU:  Understand these instructions.  Will watch your condition.  Will get help right away if you are not doing well or get worse. Document Released: 04/10/2008 Document Revised: 01/15/2012 Document Reviewed: 04/18/2011  [facility] Patient Information 2014 [place].   What to eat:  For your first meals, you should eat lightly; only small meals initially.  If you do not have nausea, you may eat larger meals.  Avoid spicy, greasy and heavy food.    Anesthesia, Adult, Care After  Refer to this sheet in the next few weeks. These instructions provide you with information on caring for yourself after your procedure. Your health care provider may also give you more specific instructions. Your treatment has been planned according to current medical practices, but problems sometimes occur. Call your health care provider if you have any  problems or questions after your procedure.  WHAT TO EXPECT AFTER THE PROCEDURE  After the procedure, it is typical to experience:  Sleepiness.  Nausea and vomiting. HOME CARE INSTRUCTIONS  For the first 24 hours after general anesthesia:  Have a responsible person with you.  Do not drive a car. If you are alone, do not take public transportation.  Do not drink alcohol.  Do not take medicine that has not been prescribed by your health care provider.  Do not sign important papers or make important decisions.  You may resume a normal diet and activities as directed by your health care provider.  Change bandages (dressings) as directed.  If you have questions or problems that seem related to general anesthesia, call the hospital and ask for the anesthetist or anesthesiologist on call. SEEK MEDICAL CARE IF:  You have nausea and vomiting that continue the day after anesthesia.  You develop a rash. SEEK IMMEDIATE MEDICAL CARE IF:  You have difficulty breathing.  You have chest pain.  You have any allergic problems. Document Released: 01/29/2001 Document Revised: 06/25/2013 Document Reviewed: 05/08/2013  [facility] Patient Information 2014 [place], [place].

## 2014-12-22 NOTE — Anesthesia Preprocedure Evaluation (Addendum)
Anesthesia Evaluation  Patient identified by MRN, date of birth, ID band Patient awake    Reviewed: Allergy & Precautions, H&P , NPO status , Patient's Chart, lab work & pertinent test results  History of Anesthesia Complications (+) PONV  Airway        Dental no notable dental hx.    Pulmonary COPDformer smoker,    Pulmonary exam normal       Cardiovascular hypertension, Pt. on medications     Neuro/Psych  Headaches, Anxiety Depression Trigeminal neuralgia    GI/Hepatic Neg liver ROS, hiatal hernia, GERD-  Medicated and Controlled,  Endo/Other  negative endocrine ROS  Renal/GU negative Renal ROS  negative genitourinary   Musculoskeletal  (+) Arthritis -, Osteoarthritis,    Abdominal   Peds  Hematology negative hematology ROS (+) anemia ,   Anesthesia Other Findings   Reproductive/Obstetrics negative OB ROS                            Anesthesia Physical Anesthesia Plan  ASA: III  Anesthesia Plan: MAC   Post-op Pain Management:    Induction: Intravenous  Airway Management Planned: Nasal Cannula  Additional Equipment:   Intra-op Plan:   Post-operative Plan:   Informed Consent: I have reviewed the patients History and Physical, chart, labs and discussed the procedure including the risks, benefits and alternatives for the proposed anesthesia with the patient or authorized representative who has indicated his/her understanding and acceptance.   Dental advisory given  Plan Discussed with: CRNA  Anesthesia Plan Comments:        Anesthesia Quick Evaluation

## 2014-12-22 NOTE — Interval H&P Note (Signed)
History and Physical Interval Note:  12/22/2014 7:20 AM  [name]  has presented today for surgery, with the diagnosis of Trigeminal neuralgia  The various methods of treatment have been discussed with the patient and family. After consideration of risks, benefits and other options for treatment, the patient has consented to  Procedure(s) with comments: Right V2 and V3 trigeminal rhysolysis (Right) - Right V2 and V3 trigeminal rhysolysis as a surgical intervention .  The patient's history has been reviewed, patient examined, no change in status, stable for surgery.  I have reviewed the patient's chart and labs.  Questions were answered to the patient's satisfaction.     Guthrie Lemme D

## 2014-12-22 NOTE — Anesthesia Postprocedure Evaluation (Signed)
  Anesthesia Post-op Note  Patient: [name] Age [name]  Procedure(s) Performed: Procedure(s) with comments: Right V2 and V3 trigeminal rhysolysis (Right) - Right V2 and V3 trigeminal rhysolysis  Patient Location: PACU  Anesthesia Type: MAC  Level of Consciousness: awake and alert   Airway and Oxygen Therapy: Patient Spontanous Breathing  Post-op Pain: none  Post-op Assessment: Post-op Vital signs reviewed, Patient's Cardiovascular Status Stable and Respiratory Function Stable  Post-op Vital Signs: Reviewed  Filed Vitals:   12/22/14 1454  BP: 150/89  Pulse:   Temp:   Resp: 20    Complications: No apparent anesthesia complications

## 2014-12-22 NOTE — H&P (Signed)
Patient ID:   [id number] Patient: [name]  Date of Birth: January 29, 1951 Visit Type: Office Visit   Date: 12/02/2014 11:45 AM Provider: Marchia Meiers. Vertell Limber MD   This 64 year old male presents for Trigeminal neuralgia.  History of Present Illness: 1.  Trigeminal neuralgia  Patient states that he is much better than he was before the surgical procedure.  He is off Tegretol.  He has decreased his gabapentin to 1600 mg per day.  Today he just started to get some shocking pain when he made that reduction.  He also says that he will get some pain when he is brushing his teeth and we'll get some lancinating pain in the front of his ear but prior to today he is not having any pain at all.  I recommended that he continue on the gabapentin back at his previous dose and to tape.  More slowly.  His facial sensation is slightly diminished on the right which is as I had hoped and this appears to be in the V2 and V3 distributions.  I told the patient we could go ahead with repeat radiofrequency rhizolysis of his pain returns and give him more dense numbness but that I think this should work well and is certainly controlling his symptoms better than he had previously been able to do with medication alone.      Medical/Surgical/Interim History Reviewed, no change.  Last detailed document date:08/10/2014.   PAST MEDICAL HISTORY, SURGICAL HISTORY, FAMILY HISTORY, SOCIAL HISTORY AND REVIEW OF SYSTEMS I have reviewed the patient's past medical, surgical, family and social history as well as the comprehensive review of systems as included on the [facility] history form dated, which I have signed.  Family History: Reviewed, no changes.  Last detailed document: 08/10/2014.   Social History: Tobacco use reviewed. Reviewed, no changes. Last detailed document date: 08/10/2014.      MEDICATIONS(added, continued or stopped this visit): Started Medication Directions  Instruction Stopped  09/22/2014 Neurontin 400 mg capsule take 2 capsule by oral route 4 times every day     Nexium 40 mg capsule,delayed release take 1 capsule by oral route  every day    10/20/2014 Tegretol 200 mg tablet take 1 tablet by oral route  BID     Xanax 1 mg tablet take 1 tablet by oral route 3 times every day       ALLERGIES: Ingredient Reaction Medication Name Comment  NO KNOWN ALLERGIES     No known allergies.    Vitals Date Temp F BP Pulse Ht In Wt Lb BMI BSA Pain Score  12/02/2014  116/69 82 71 238 33.19  0/10      IMPRESSION Right V2 and V3 Trigeminal neuralgia which is improving after radiofrequency rhizolysis.  Completed Orders (this encounter) Order Details Reason Side Interpretation Result Initial Treatment Date Region  Lifestyle education regarding diet Encouraged to eat a well balanced diet and follow up with primary care physician.         Assessment/Plan # Detail Type Description   1. Assessment Trigeminal neuralgia (G50.0).       2. Assessment Body mass index (BMI) 33.0-33.9, adult (F64.33).   Plan Orders Today's instructions / counseling include(s) Lifestyle education regarding diet.         Pain Assessment/Treatment Pain Scale: 0/10. Method: Numeric Pain Intensity Scale. Location: face. Onset: 07/10/2014. Duration: varies. Quality: discomforting. Pain Assessment/Treatment follow-up plan of care: Patient is taking medications as prescribed..  Continue to taper her  Neurontin but more slowly.  Patient will follow up with me in one month for recheck.  Orders: Diagnostic Procedures: Assessment Procedure  G50.0 Return to Clinic  Instruction(s)/Education: Assessment Instruction  [phone] Lifestyle education regarding diet             Provider:  Marchia Meiers. Vertell Limber MD  12/08/2014 04:18 PM Dictation edited by: Marchia Meiers. Vertell Limber    CC Providers: Erline Levine MD [id number] [place], [place]  [phone]              Electronically signed by Marchia Meiers. Vertell Limber MD on 12/08/2014 04:20 PM

## 2014-12-22 NOTE — Op Note (Signed)
12/22/2014  2:50 PM  PATIENT:  [name]  64 y.o. male  PRE-OPERATIVE DIAGNOSIS:  Trigeminal neuralgia  POST-OPERATIVE DIAGNOSIS:  Trigeminal neuralgia  PROCEDURE:  Procedure(s) with comments: Right V2 and V3 trigeminal rhysolysis (Right) - Right V2 and V3 trigeminal rhysolysis  SURGEON:  Surgeon(s) and Role:    * Erline Levine, MD - Primary    * Consuella Lose, MD - Assisting  PHYSICIAN ASSISTANT:   ASSISTANTS: none   ANESTHESIA:   IV sedation  EBL:     BLOOD ADMINISTERED:none  DRAINS: none   LOCAL MEDICATIONS USED:  LIDOCAINE   SPECIMEN:  No Specimen  DISPOSITION OF SPECIMEN:  N/A  COUNTS:  YES  TOURNIQUET:  * No tourniquets in log *  DICTATION: DICTATION: Indications:  Patient is 64 year old man with medically refractory right trigeminal neuralgia who did well after MVD but now has return of trigeminal neuralgia.  He presents for repeat percutaneous radiofrequency rhizolysis as he had relief after previous procedure but his pain has returned.  Procedure: Patient was brought to OR and given IV sedation with versed and fentanyl.  Right face was prepped with betadine.  Skin and subcutaneous cheek was infiltrated with lidocaine to a depth of 1.5 inches.  5 mm exposed tip 22 gauge needle was inserted into Foramen Ovale and positioning was confirmed on AP and lateral fluoroscopy with one hand in the patient's mouth to confirm no penetration of the oral mucosa.  The needle tip was placed in the medial half of the Foramen.  Depth of needle position was confirmed with lateral fluoroscopy.  With electrical stimulation, patient had facial pain at .09V at 1 ms and 50 Hz in a V 2 distribution.  He had no jaw twitching at 0.22V at 5 Hz and 1 ms.  Patient was then given IV sedation with propofol and then a lesion was made to 65 degrees centigrade for 90 seconds.  While the patient was asleep, the needle was pulled back 3 mm and an additional lesion was created at a similar  temperature and duration and a third lesion was made after removing the needle an additional 3 mm.  The patient had facial flushing in a V 2 distribution with this procedure.  The needle was then withdrawn and a sterile dressing was placed.  The patient was taken to recovery having tolerated his procedure well.  He had some numbness to pinprick in right V 2 and V 3 distribution.   PLAN OF CARE: Discharge to home after PACU  PATIENT DISPOSITION:  PACU - hemodynamically stable.   Delay start of Pharmacological VTE agent (>24hrs) due to surgical blood loss or risk of bleeding: yes

## 2014-12-23 ENCOUNTER — Telehealth: Payer: Self-pay | Admitting: Family Medicine

## 2014-12-23 ENCOUNTER — Encounter (HOSPITAL_COMMUNITY): Payer: Self-pay | Admitting: Neurosurgery

## 2014-12-23 DIAGNOSIS — N400 Enlarged prostate without lower urinary tract symptoms: Secondary | ICD-10-CM

## 2014-12-23 NOTE — Telephone Encounter (Signed)
Dr Maudie Mercury approved this and the order entered and the pt was informed and is aware to expect a call with appt info.

## 2014-12-23 NOTE — Telephone Encounter (Signed)
Pt would like to go to another urologist and leave [facility].  Pt states they have not helped him, pt is in a lot of pain, been on 4 antibiotics in the last month. Pt would like to make a change.   Dr Rosana Hoes office told pt  they will accept w/ a referral from pcp, and could be seen in March Pt has been going to [facility] >16 yrs.  Dept of Urology @ [facility] Dr Tresa Endo [address] [place] [phone]

## 2014-12-24 DIAGNOSIS — N401 Enlarged prostate with lower urinary tract symptoms: Secondary | ICD-10-CM | POA: Diagnosis not present

## 2014-12-24 DIAGNOSIS — N419 Inflammatory disease of prostate, unspecified: Secondary | ICD-10-CM | POA: Diagnosis not present

## 2014-12-24 DIAGNOSIS — R300 Dysuria: Secondary | ICD-10-CM | POA: Diagnosis not present

## 2014-12-25 NOTE — Discharge Summary (Signed)
Physician Discharge Summary  Patient ID: LEONCIO HANSEN MRN: [id number] DOB/AGE: 1951-09-11 64 y.o.  Admit date: 12/22/2014 Discharge date: 12/25/2014  Admission Diagnoses:Trigeminal neuralgia  Discharge Diagnoses: Same Active Problems:   * No active hospital problems. *   Discharged Condition: good  Hospital Course: Right V 2 and V 3 Percutaneous Radiofrequency Rhizolysis for Trigeminal Neuralgia  Consults: None  Significant Diagnostic Studies: None  Treatments: surgery: Right V 2 and V 3 Percutaneous Radiofrequency Rhizolysis for Trigeminal Neuralgia  Discharge Exam: Blood pressure 166/88, pulse 66, temperature 97 F (36.1 C), temperature source Oral, resp. rate 15, height 5\' 11"  (1.803 m), weight 109.77 kg (242 lb), SpO2 99 %. Neurologic: Alert and oriented X 3, normal strength and tone. Normal symmetric reflexes. Normal coordination and gait Wound:CDI  Disposition: Home     Medication List    ASK your doctor about these medications        acetaminophen 325 MG tablet  Commonly known as:  TYLENOL  Take 650 mg by mouth every 6 (six) hours as needed (pain).     ALPRAZolam 1 MG tablet  Commonly known as:  XANAX  Take 1 mg by mouth at bedtime as needed for anxiety.     carbamazepine 200 MG tablet  Commonly known as:  TEGRETOL  Take 200 mg by mouth 2 (two) times daily.     doxycycline 100 MG tablet  Commonly known as:  ADOXA  Take 100 mg by mouth 2 (two) times daily.     doxycycline 100 MG tablet  Commonly known as:  VIBRA-TABS  Take 100 mg by mouth 2 (two) times daily. Patient states he started medication last week from the date of 12-17-14     esomeprazole 40 MG capsule  Commonly known as:  NEXIUM  Take 1 capsule (40 mg total) by mouth daily.     gabapentin 400 MG capsule  Commonly known as:  NEURONTIN  Take 800 mg by mouth 4 (four) times daily.     losartan-hydrochlorothiazide 50-12.5 MG per tablet  Commonly known as:  HYZAAR  TAKE HALF A TABLET  BY MOUTH EVERY DAY     polyethylene glycol packet  Commonly known as:  MIRALAX / GLYCOLAX  Take 17 g by mouth daily. Mix in 8 oz of liquid and drink     polyethylene glycol powder powder  Commonly known as:  GLYCOLAX/MIRALAX  DISSOLVE 1 CAPFUL IN LIQUID EVERY DAY AS NEEDED FOR CONSTIPATION     sucralfate 1 G tablet  Commonly known as:  CARAFATE  Take 1 g by mouth 4 (four) times daily as needed (acid reflux).     tamsulosin 0.4 MG Caps capsule  Commonly known as:  FLOMAX  Take 0.4 mg by mouth 2 (two) times daily.           Follow-up Information    Follow up with Peggyann Shoals, MD.   Specialty:  Neurosurgery   Contact information:   [address] [place] [address] [place] [address] [phone]       Signed: Peggyann Shoals, MD 12/25/2014, 7:28 AM

## 2015-01-11 ENCOUNTER — Encounter: Payer: Self-pay | Admitting: Family Medicine

## 2015-01-11 ENCOUNTER — Ambulatory Visit (INDEPENDENT_AMBULATORY_CARE_PROVIDER_SITE_OTHER): Payer: Medicare Other | Admitting: Family Medicine

## 2015-01-11 VITALS — BP 140/90 | HR 71 | Temp 97.6°F | Ht 71.0 in | Wt 239.9 lb

## 2015-01-11 DIAGNOSIS — N400 Enlarged prostate without lower urinary tract symptoms: Secondary | ICD-10-CM

## 2015-01-11 DIAGNOSIS — F329 Major depressive disorder, single episode, unspecified: Secondary | ICD-10-CM

## 2015-01-11 DIAGNOSIS — G500 Trigeminal neuralgia: Secondary | ICD-10-CM

## 2015-01-11 DIAGNOSIS — E785 Hyperlipidemia, unspecified: Secondary | ICD-10-CM | POA: Diagnosis not present

## 2015-01-11 DIAGNOSIS — F418 Other specified anxiety disorders: Secondary | ICD-10-CM

## 2015-01-11 DIAGNOSIS — F32A Depression, unspecified: Secondary | ICD-10-CM

## 2015-01-11 DIAGNOSIS — K219 Gastro-esophageal reflux disease without esophagitis: Secondary | ICD-10-CM | POA: Diagnosis not present

## 2015-01-11 DIAGNOSIS — Z0000 Encounter for general adult medical examination without abnormal findings: Secondary | ICD-10-CM | POA: Diagnosis not present

## 2015-01-11 DIAGNOSIS — F419 Anxiety disorder, unspecified: Secondary | ICD-10-CM

## 2015-01-11 DIAGNOSIS — I10 Essential (primary) hypertension: Secondary | ICD-10-CM

## 2015-01-11 LAB — LIPID PANEL
CHOL/HDL RATIO: 3
Cholesterol: 164 mg/dL (ref 0–200)
HDL: 58.4 mg/dL (ref >39.00)
LDL CALC: 85 mg/dL (ref 0–99)
NONHDL: 105.6
TRIGLYCERIDES: 104 mg/dL (ref 0.0–149.0)
VLDL: 20.8 mg/dL (ref 0.0–40.0)

## 2015-01-11 LAB — HEMOGLOBIN A1C: HEMOGLOBIN A1C: 5.6 % (ref 4.6–6.5)

## 2015-01-11 MED ORDER — LOSARTAN POTASSIUM-HCTZ 100-25 MG PO TABS: 1.0000 | ORAL_TABLET | Freq: Every day | ORAL | Status: DC

## 2015-01-11 MED ORDER — OMEPRAZOLE 40 MG PO CPDR: 40.0000 mg | DELAYED_RELEASE_CAPSULE | Freq: Every day | ORAL | Status: DC

## 2015-01-11 NOTE — Patient Instructions (Addendum)
BEFORE YOU LEAVE: -labs -follow up in 1-2 months  Increase the Losartan-hctz to 100-50mg  daily (I sent a new prescription to the pharmacy)  We recommend the following healthy lifestyle measures: - eat a healthy diet consisting of lots of vegetables, fruits, beans, nuts, seeds, healthy meats such as white chicken and fish and whole grains.  - avoid fried foods, fast food, processed foods, sodas, red meet and other fattening foods.  - get a least 150 minutes of aerobic exercise per week.

## 2015-01-11 NOTE — Progress Notes (Signed)
HPI:  Here for CPE:  -Concerns and/or follow up today:  1-4)HTN/HLD/Prediabetes/Obesity: -meds: losartan-HCTZ has increased to 1 tablet -diet/lifestyle: cutting back on carbs and sweets - he used to eat a ton of sweets  -no regular exercise   5)Dep/Anx: -sees Dr. [name] -meds: xanax  6)GERD/HH: -followed by GI -meds: PPI, carafate - wants to change to prilosec - takes high dose per his report per GI recs -denies: heartburn, dysphagia  7)BPH/hypogonadism/elevated PSA: -followed by urology - sees Dr. Karsten Ro next month and will do PSA there  8) COPD -sees Dr. [name] for this  9)AR: -chronic allergies -takes allegra, used to get allergy shots but did not seem to help - not doing INS  7)Trigeminal neuralgia: -seeing [facility], Dr. Vertell Limber -has changed facial symetry  HM: -UTD on colon ca screening -zostavax:  declined -former smoker - quit in 98, Followed by Dr. [name]  -Diet: variety of foods, balance and well rounded, larger portion sizes  -Exercise: no regular exercise  -Diabetes and Dyslipidemia Screening: FASTING  -Hx of HTN: no  -Vaccines: UTD except declined shingles due to cost  -sexual activity: yes, male partner, no new partners  -wants STI testing, Hep C screening (if born 10-1963): no  -FH colon or prstate ca: see FH Last colon cancer screening: does w/ GI - UTD Last prostate ca screening: does with urology  -Alcohol, Tobacco, drug use: see social history  Review of Systems - no fevers, unintentional weight loss, vision loss, hearing loss, chest pain, sob, hemoptysis, melena, hematochezia, hematuria, genital discharge, changing or concerning skin lesions, bleeding, bruising, loc, thoughts of self harm or SI  Past Medical History  Diagnosis Date  . Anxiety   . Depression   . GERD (gastroesophageal reflux disease)   . HTN (hypertension)   . Lung disease     peritonitis after surgery in 1999  . IBS (irritable bowel syndrome)   . Dyslipidemia    . Anemia, pernicious   . ACE-inhibitor cough   . Nephrolithiasis   . Allergic rhinitis   . Diverticulosis of colon   . Elevated PSA   . Hernia   . Peritonitis   . Hyperplastic colon polyp   . Rectal fissure   . Arthritis   . Depression   . Hypertension   . Hx of blood transfusion reaction   . Kidney stone   . Hyperlipidemia   . Rectal fissure   . Hyperparathyroidism   . Nephrolithiasis   . PONV (postoperative nausea and vomiting)   . Dysuria     has to be catheterized after anesthesia  . Catheter-associated urinary tract infection   . Trigeminal neuralgia of right side of face   . Headache   . Ventral hernia   . Facial asymmetry     Past Surgical History  Procedure Laterality Date  . Hand surgery  1973    Left  . Knee surgery  1997    Left  . Cholecystectomy  1981  . Abdominal surgery      Multiple  . Ventral hernia repair    . Nissen fundoplication  6213     complicated by gastric perforation, peritonitis, ventral nernia   . Lithotripsy      Right  . Brain surgery  March 2012    Trigeminal nerve  . Upper gastrointestinal endoscopy    . Hernia repair    . Rhizotomy Right 11/17/2014    Procedure: RHIZOTOMY TO RIGHT V2,V3;  Surgeon: Erline Levine, MD;  Location: [place] [facility];  Service: Neurosurgery;  Laterality: Right;  right  . Rhizotomy Right 12/22/2014    Procedure: Right V2 and V3 trigeminal rhysolysis;  Surgeon: Erline Levine, MD;  Location: [place] [facility];  Service: Neurosurgery;  Laterality: Right;  Right V2 and V3 trigeminal rhysolysis    Family History  Problem Relation Age of Onset  . Diabetes Mother   . Coronary artery disease Mother     CABG  . Diabetes Sister   . Diabetes Brother   . Hypertension Brother   . Colon cancer Neg Hx   . Esophageal cancer Neg Hx   . Stomach cancer Neg Hx   . Clotting disorder Brother     History   Social History  . Marital Status: Married    Spouse Name: N/A  . Number of Children: 2  . Years of Education: N/A    Occupational History  . Disabled - psych    Social History Main Topics  . Smoking status: Former Smoker -- 1.00 packs/day for 20 years    Types: Cigarettes    Quit date: 11/06/1996  . Smokeless tobacco: Former Systems developer    Types: [place] date: 11/07/1983  . Alcohol Use: No  . Drug Use: No  . Sexual Activity: Not on file   Other Topics Concern  . None   Social History Narrative   Daily Caffeine Use:  Yes           Current outpatient prescriptions:  .  acetaminophen (TYLENOL) 325 MG tablet, Take 650 mg by mouth every 6 (six) hours as needed (pain)., Disp: , Rfl:  .  ALPRAZolam (XANAX) 1 MG tablet, Take 1 mg by mouth at bedtime as needed for anxiety. , Disp: , Rfl:  .  carbamazepine (TEGRETOL) 200 MG tablet, Take 100 mg by mouth 2 (two) times daily. , Disp: , Rfl: 0 .  gabapentin (NEURONTIN) 400 MG capsule, Take 800 mg by mouth 4 (four) times daily. , Disp: , Rfl:  .  polyethylene glycol powder (GLYCOLAX/MIRALAX) powder, DISSOLVE 1 CAPFUL IN LIQUID EVERY DAY AS NEEDED FOR CONSTIPATION, Disp: 527 g, Rfl: 2 .  sucralfate (CARAFATE) 1 G tablet, Take 1 g by mouth 4 (four) times daily as needed (acid reflux). , Disp: , Rfl:  .  tamsulosin (FLOMAX) 0.4 MG CAPS capsule, Take 0.4 mg by mouth 2 (two) times daily., Disp: , Rfl:  .  losartan-hydrochlorothiazide (HYZAAR) 100-25 MG per tablet, Take 1 tablet by mouth daily., Disp: 90 tablet, Rfl: 3 .  omeprazole (PRILOSEC) 40 MG capsule, Take 1 capsule (40 mg total) by mouth daily., Disp: 90 capsule, Rfl: 3 .  [DISCONTINUED] ferrous sulfate 325 (65 FE) MG tablet, Take 325 mg by mouth daily with breakfast.  , Disp: , Rfl:  .  [DISCONTINUED] losartan (COZAAR) 50 MG tablet, Take 1 tablet (50 mg total) by mouth daily., Disp: 30 tablet, Rfl: 6  EXAM:  Filed Vitals:   01/11/15 0818  BP: 140/90  Pulse: 71  Temp: 97.6 F (36.4 C)  TempSrc: Oral  Height: 5\' 11"  (1.803 m)  Weight: 239 lb 14.4 oz (108.818 kg)    Estimated body mass index is  33.47 kg/(m^2) as calculated from the following:   Height as of this encounter: 5\' 11"  (1.803 m).   Weight as of this encounter: 239 lb 14.4 oz (108.818 kg).  GENERAL: vitals reviewed and listed below, alert, oriented, appears well hydrated and in no acute distress  HEENT: head atraumatic, facial asymetry, PERRLA, normal appearance  of eyes, ears, nose and mouth. moist mucus membranes.  NECK: supple, no masses or lymphadenopathy  LUNGS: clear to auscultation bilaterally, no rales, rhonchi or wheeze  CV: HRRR, no peripheral edema or cyanosis, normal pedal pulses  ABDOMEN: bowel sounds normal, soft, non tender to palpation, no masses, no rebound or guarding  GU:  Declined  RECTAL: refused  SKIN: no rash or abnormal lesions  MS: normal gait, moves all extremities normally  NEURO: CN II-XII grossly intact, normal muscle strength and sensation to light touch on extremities  PSYCH: normal affect, pleasant and cooperative  ASSESSMENT AND PLAN:  Discussed the following assessment and plan:  There are no diagnoses linked to this encounter.  -Discussed and advised all [place] preventive services health task force level A and B recommendations for age, sex and risks.  -Advised at least 150 minutes of exercise per week and a healthy diet low in saturated fats and sweets and consisting of fresh fruits and vegetables, lean meats such as fish and white chicken and whole grains.  -FASTING labs, studies and vaccines per orders this encounter  Made adjustment to BP med   Patient advised to return to clinic immediately if symptoms worsen or persist or new concerns.  Patient Instructions  BEFORE YOU LEAVE: -labs -follow up in 1-2 months  Increase the Losartan-hctz to 100-50mg  daily (I sent a new prescription to the pharmacy)  We recommend the following healthy lifestyle measures: - eat a healthy diet consisting of lots of vegetables, fruits, beans, nuts, seeds, healthy meats such as white  chicken and fish and whole grains.  - avoid fried foods, fast food, processed foods, sodas, red meet and other fattening foods.  - get a least 150 minutes of aerobic exercise per week.       Return in about 1 month (around 02/11/2015) for follow up HTN.   [name] R.

## 2015-01-11 NOTE — Progress Notes (Signed)
Pre visit review using our clinic review tool, if applicable. No additional management support is needed unless otherwise documented below in the visit note. 

## 2015-02-11 ENCOUNTER — Encounter: Payer: Self-pay | Admitting: Family Medicine

## 2015-02-11 ENCOUNTER — Ambulatory Visit (INDEPENDENT_AMBULATORY_CARE_PROVIDER_SITE_OTHER): Payer: Medicare Other | Admitting: Family Medicine

## 2015-02-11 VITALS — BP 102/70 | HR 93 | Temp 98.1°F | Ht 71.0 in | Wt 246.5 lb

## 2015-02-11 DIAGNOSIS — R194 Change in bowel habit: Secondary | ICD-10-CM

## 2015-02-11 DIAGNOSIS — F418 Other specified anxiety disorders: Secondary | ICD-10-CM

## 2015-02-11 DIAGNOSIS — I10 Essential (primary) hypertension: Secondary | ICD-10-CM | POA: Diagnosis not present

## 2015-02-11 DIAGNOSIS — F419 Anxiety disorder, unspecified: Principal | ICD-10-CM

## 2015-02-11 DIAGNOSIS — F329 Major depressive disorder, single episode, unspecified: Secondary | ICD-10-CM

## 2015-02-11 DIAGNOSIS — F32A Depression, unspecified: Secondary | ICD-10-CM

## 2015-02-11 MED ORDER — LOSARTAN POTASSIUM-HCTZ 50-12.5 MG PO TABS: 1.0000 | ORAL_TABLET | Freq: Every day | ORAL | Status: DC

## 2015-02-11 NOTE — Progress Notes (Signed)
HPI:  HTN f/u: -we increased his blood pressure mdication at the last visit due to elevated blood pressure - however he did not tol higher dose (dizziness and low BP) and is back on losartan-hctz 50-12.5 (he is taking half tablet) -denies: CP, SOB, dizziness on this dose -reports only change has been he stopped tegretol and has had increased flomax  Change in stool color: -the last few weeks -slightly darker brown -denies: blood in stools, change in bowel consistency, diarrhea, constipation, abd pain -does have hx of hemorrhoids -he wants to check stool cards and see his gi doc if positive  GAD: -this has worsened, seeing Dr. [name] -in creased worry and anxiety  ROS: See pertinent positives and negatives per HPI.  Past Medical History  Diagnosis Date  . Anxiety   . Depression   . GERD (gastroesophageal reflux disease)   . HTN (hypertension)   . Lung disease     peritonitis after surgery in 1999  . IBS (irritable bowel syndrome)   . Dyslipidemia   . Anemia, pernicious   . ACE-inhibitor cough   . Nephrolithiasis   . Allergic rhinitis   . Diverticulosis of colon   . Elevated PSA   . Hernia   . Peritonitis   . Hyperplastic colon polyp   . Rectal fissure   . Arthritis   . Depression   . Hypertension   . Hx of blood transfusion reaction   . Kidney stone   . Hyperlipidemia   . Rectal fissure   . Hyperparathyroidism   . Nephrolithiasis   . PONV (postoperative nausea and vomiting)   . Dysuria     has to be catheterized after anesthesia  . Catheter-associated urinary tract infection   . Trigeminal neuralgia of right side of face   . Headache   . Ventral hernia   . Facial asymmetry     Past Surgical History  Procedure Laterality Date  . Hand surgery  1973    Left  . Knee surgery  1997    Left  . Cholecystectomy  1981  . Abdominal surgery      Multiple  . Ventral hernia repair    . Nissen fundoplication  7846     complicated by gastric perforation,  peritonitis, ventral nernia   . Lithotripsy      Right  . Brain surgery  March 2012    Trigeminal nerve  . Upper gastrointestinal endoscopy    . Hernia repair    . Rhizotomy Right 11/17/2014    Procedure: RHIZOTOMY TO RIGHT V2,V3;  Surgeon: Erline Levine, MD;  Location: [place] [facility];  Service: Neurosurgery;  Laterality: Right;  right  . Rhizotomy Right 12/22/2014    Procedure: Right V2 and V3 trigeminal rhysolysis;  Surgeon: Erline Levine, MD;  Location: [place] [facility];  Service: Neurosurgery;  Laterality: Right;  Right V2 and V3 trigeminal rhysolysis    Family History  Problem Relation Age of Onset  . Diabetes Mother   . Coronary artery disease Mother     CABG  . Diabetes Sister   . Diabetes Brother   . Hypertension Brother   . Colon cancer Neg Hx   . Esophageal cancer Neg Hx   . Stomach cancer Neg Hx   . Clotting disorder Brother     History   Social History  . Marital Status: Married    Spouse Name: N/A  . Number of Children: 2  . Years of Education: N/A   Occupational History  .  Disabled - psych    Social History Main Topics  . Smoking status: Former Smoker -- 1.00 packs/day for 20 years    Types: Cigarettes    Quit date: 11/06/1996  . Smokeless tobacco: Former Systems developer    Types: [place] date: 11/07/1983  . Alcohol Use: No  . Drug Use: No  . Sexual Activity: Not on file   Other Topics Concern  . None   Social History Narrative   Daily Caffeine Use:  Yes           Current outpatient prescriptions:  .  acetaminophen (TYLENOL) 325 MG tablet, Take 650 mg by mouth every 6 (six) hours as needed (pain)., Disp: , Rfl:  .  ALPRAZolam (XANAX) 1 MG tablet, Take 1 mg by mouth at bedtime as needed for anxiety. , Disp: , Rfl:  .  carbamazepine (TEGRETOL) 200 MG tablet, Take 100 mg by mouth 2 (two) times daily. , Disp: , Rfl: 0 .  gabapentin (NEURONTIN) 400 MG capsule, Take 800 mg by mouth 4 (four) times daily. , Disp: , Rfl:  .  omeprazole (PRILOSEC) 40 MG capsule,  Take 1 capsule (40 mg total) by mouth daily., Disp: 90 capsule, Rfl: 3 .  polyethylene glycol powder (GLYCOLAX/MIRALAX) powder, DISSOLVE 1 CAPFUL IN LIQUID EVERY DAY AS NEEDED FOR CONSTIPATION, Disp: 527 g, Rfl: 2 .  sucralfate (CARAFATE) 1 G tablet, Take 1 g by mouth 4 (four) times daily as needed (acid reflux). , Disp: , Rfl:  .  tamsulosin (FLOMAX) 0.4 MG CAPS capsule, Take 0.4 mg by mouth 2 (two) times daily., Disp: , Rfl:  .  losartan-hydrochlorothiazide (HYZAAR) 50-12.5 MG per tablet, Take 1 tablet by mouth daily., Disp: 90 tablet, Rfl: 3 .  [DISCONTINUED] ferrous sulfate 325 (65 FE) MG tablet, Take 325 mg by mouth daily with breakfast.  , Disp: , Rfl:  .  [DISCONTINUED] losartan (COZAAR) 50 MG tablet, Take 1 tablet (50 mg total) by mouth daily., Disp: 30 tablet, Rfl: 6  EXAM:  Filed Vitals:   02/11/15 1017  BP: 102/70  Pulse: 93  Temp: 98.1 F (36.7 C)    Body mass index is 34.4 kg/(m^2).  GENERAL: vitals reviewed and listed above, alert, oriented, appears well hydrated and in no acute distress  HEENT: atraumatic, conjunttiva clear, no obvious abnormalities on inspection of external nose and ears  NECK: no obvious masses on inspection  LUNGS: clear to auscultation bilaterally, no wheezes, rales or rhonchi, good air movement  CV: HRRR, no peripheral edema  MS: moves all extremities without noticeable abnormality  PSYCH: pleasant and cooperative, no obvious depression or anxiety  ASSESSMENT AND PLAN:  Discussed the following assessment and plan:  Anxiety and depression, followed by Dr. Robina Ade in Psych -advised follow up with psych, suspect fluctuations may be due to prn use of short acting benzo for GAD  Essential hypertension - Plan: losartan-hydrochlorothiazide (HYZAAR) 50-12.5 MG per tablet -change back to lower dose medicaiton - suspect stopping the tegretol and increasing flomax played a role in this  Change in bowel habits -stool cards, advised he see Dr. Olevia Perches  if abnormal or concerns persist, suspect change sin medicaiton and diet cause - no signs and symptoms to suggest blood in stool but he is anxious and wants to check.  -Patient advised to return or notify a doctor immediately if symptoms worsen or persist or new concerns arise.  There are no Patient Instructions on file for this visit.   Colin Benton R.

## 2015-02-11 NOTE — Progress Notes (Signed)
Pre visit review using our clinic review tool, if applicable. No additional management support is needed unless otherwise documented below in the visit note. 

## 2015-02-11 NOTE — Patient Instructions (Signed)
Sent lower dose of blood pressure medication  Complete and return stool cards and see Dr. Olevia Perches if symptoms persist or worsen  See psychiatrist about your anxiety  Follow up in 3 months or as needed

## 2015-04-14 DIAGNOSIS — G500 Trigeminal neuralgia: Secondary | ICD-10-CM | POA: Diagnosis not present

## 2015-04-29 ENCOUNTER — Other Ambulatory Visit: Payer: Self-pay

## 2015-04-29 DIAGNOSIS — R1032 Left lower quadrant pain: Secondary | ICD-10-CM

## 2015-04-29 DIAGNOSIS — K439 Ventral hernia without obstruction or gangrene: Secondary | ICD-10-CM | POA: Diagnosis not present

## 2015-04-30 ENCOUNTER — Other Ambulatory Visit: Payer: Medicare Other

## 2015-05-11 ENCOUNTER — Ambulatory Visit
Admission: RE | Admit: 2015-05-11 | Discharge: 2015-05-11 | Disposition: A | Discharge status: Home / Self Care | Payer: Medicare Other | Source: Ambulatory Visit | Attending: Surgery | Admitting: Surgery

## 2015-05-11 DIAGNOSIS — R1032 Left lower quadrant pain: Secondary | ICD-10-CM | POA: Diagnosis not present

## 2015-05-11 MED ORDER — IOPAMIDOL (ISOVUE-300) INJECTION 61%
125.0000 mL | Freq: Once | INTRAVENOUS | Status: AC | PRN
  Administered 2015-05-11: 125 mL via INTRAVENOUS

## 2015-05-13 ENCOUNTER — Ambulatory Visit: Payer: Medicare Other | Admitting: Family Medicine

## 2015-05-25 ENCOUNTER — Encounter: Payer: Self-pay | Admitting: Gastroenterology

## 2015-05-25 ENCOUNTER — Encounter: Payer: Self-pay | Admitting: Internal Medicine

## 2015-06-03 ENCOUNTER — Encounter: Payer: Self-pay | Admitting: Family Medicine

## 2015-06-03 ENCOUNTER — Ambulatory Visit (INDEPENDENT_AMBULATORY_CARE_PROVIDER_SITE_OTHER): Payer: Medicare Other | Admitting: Family Medicine

## 2015-06-03 VITALS — BP 122/78 | HR 76 | Temp 97.9°F | Ht 71.0 in | Wt 249.8 lb

## 2015-06-03 DIAGNOSIS — F329 Major depressive disorder, single episode, unspecified: Secondary | ICD-10-CM

## 2015-06-03 DIAGNOSIS — K219 Gastro-esophageal reflux disease without esophagitis: Secondary | ICD-10-CM | POA: Diagnosis not present

## 2015-06-03 DIAGNOSIS — R109 Unspecified abdominal pain: Secondary | ICD-10-CM | POA: Diagnosis not present

## 2015-06-03 DIAGNOSIS — N400 Enlarged prostate without lower urinary tract symptoms: Secondary | ICD-10-CM | POA: Diagnosis not present

## 2015-06-03 DIAGNOSIS — F418 Other specified anxiety disorders: Secondary | ICD-10-CM

## 2015-06-03 DIAGNOSIS — G500 Trigeminal neuralgia: Secondary | ICD-10-CM

## 2015-06-03 DIAGNOSIS — E538 Deficiency of other specified B group vitamins: Secondary | ICD-10-CM

## 2015-06-03 DIAGNOSIS — R5383 Other fatigue: Secondary | ICD-10-CM | POA: Diagnosis not present

## 2015-06-03 DIAGNOSIS — F419 Anxiety disorder, unspecified: Secondary | ICD-10-CM

## 2015-06-03 DIAGNOSIS — F32A Depression, unspecified: Secondary | ICD-10-CM

## 2015-06-03 DIAGNOSIS — I10 Essential (primary) hypertension: Secondary | ICD-10-CM | POA: Diagnosis not present

## 2015-06-03 LAB — CBC WITH DIFFERENTIAL/PLATELET
Basophils Absolute: 0 10*3/uL (ref 0.0–0.1)
Basophils Relative: 0.6 % (ref 0.0–3.0)
Eosinophils Absolute: 0.1 10*3/uL (ref 0.0–0.7)
Eosinophils Relative: 1.5 % (ref 0.0–5.0)
HEMATOCRIT: 39.9 % (ref 39.0–52.0)
HEMOGLOBIN: 12.8 g/dL — AB (ref 13.0–17.0)
LYMPHS ABS: 2.7 10*3/uL (ref 0.7–4.0)
Lymphocytes Relative: 35.9 % (ref 12.0–46.0)
MCHC: 32.1 g/dL (ref 30.0–36.0)
MCV: 75.4 fl — ABNORMAL LOW (ref 78.0–100.0)
MONO ABS: 0.6 10*3/uL (ref 0.1–1.0)
MONOS PCT: 7.7 % (ref 3.0–12.0)
NEUTROS ABS: 4 10*3/uL (ref 1.4–7.7)
Neutrophils Relative %: 54.3 % (ref 43.0–77.0)
PLATELETS: 164 10*3/uL (ref 150.0–400.0)
RBC: 5.3 Mil/uL (ref 4.22–5.81)
RDW: 15.9 % — ABNORMAL HIGH (ref 11.5–15.5)
WBC: 7.4 10*3/uL (ref 4.0–10.5)

## 2015-06-03 LAB — BASIC METABOLIC PANEL
BUN: 14 mg/dL (ref 6–23)
CO2: 28 mEq/L (ref 19–32)
Calcium: 9.5 mg/dL (ref 8.4–10.5)
Chloride: 103 mEq/L (ref 96–112)
Creatinine, Ser: 1.01 mg/dL (ref 0.40–1.50)
GFR: 79.31 mL/min (ref >60.00)
GLUCOSE: 90 mg/dL (ref 70–99)
Potassium: 3.8 mEq/L (ref 3.5–5.1)
Sodium: 142 mEq/L (ref 135–145)

## 2015-06-03 LAB — PSA: PSA: 8.5 ng/mL — AB (ref 0.10–4.00)

## 2015-06-03 LAB — VITAMIN B12: Vitamin B-12: 1125 pg/mL — ABNORMAL HIGH (ref 211–911)

## 2015-06-03 LAB — TSH: TSH: 1.49 u[IU]/mL (ref 0.35–4.50)

## 2015-06-03 NOTE — Patient Instructions (Signed)
BEFORE YOU LEAVE: -labs -schedule follow up in 3 months  We recommend the following healthy lifestyle measures: - eat a healthy diet consisting of lots of vegetables, fruits, beans, nuts, seeds, healthy meats such as white chicken and fish  - avoid fried foods, fast food, processed foods, sodas, red meet and other fattening foods.  - get a least 150 minutes of aerobic exercise per week.

## 2015-06-03 NOTE — Progress Notes (Signed)
Pre visit review using our clinic review tool, if applicable. No additional management support is needed unless otherwise documented below in the visit note. 

## 2015-06-03 NOTE — Progress Notes (Signed)
HPI:  HTN f/u: -meds: losartan-hctz 50-12.5  -denies: CP, SOB, dizziness on this dose  GAD: -this has worsened, seeing Dr. [name] but has stopped most meds other the prn xanax at night -increased worry and anxiety and fatigue  -denies worsening depression  Change in stool color: -02/2015, resolved per his reports so he did not do the stool cards, bowels all normal per report now  Trigeminal neuralgia: -sees Dr. Vertell Limber -taking neurontin - this makes him tired no longer on tegretol - fatigue about 2 hours after taking this  Hx B12 def: -on shots in the past, now on oral  Hx of elevated PSA/low testosterone: -reports PSA 7-9 in the past chronically -he sees Dr. Edwena Blow for this, but has been some time since he saw him -was on shots of testosterone in the past but reports could not get this regulated, does report fatigue -wants to check PSA -denies: urinary symptoms   ROS: See pertinent positives and negatives per HPI.  Past Medical History  Diagnosis Date  . Anxiety   . Depression   . GERD (gastroesophageal reflux disease)   . HTN (hypertension)   . Lung disease     peritonitis after surgery in 1999  . IBS (irritable bowel syndrome)   . Dyslipidemia   . Anemia, pernicious   . ACE-inhibitor cough   . Nephrolithiasis   . Allergic rhinitis   . Diverticulosis of colon   . Elevated PSA   . Hernia   . Peritonitis   . Hyperplastic colon polyp   . Rectal fissure   . Arthritis   . Depression   . Hypertension   . Hx of blood transfusion reaction   . Kidney stone   . Hyperlipidemia   . Rectal fissure   . Hyperparathyroidism   . Nephrolithiasis   . PONV (postoperative nausea and vomiting)   . Dysuria     has to be catheterized after anesthesia  . Catheter-associated urinary tract infection   . Trigeminal neuralgia of right side of face   . Headache   . Ventral hernia   . Facial asymmetry     Past Surgical History  Procedure Laterality Date  . Hand surgery  1973     Left  . Knee surgery  1997    Left  . Cholecystectomy  1981  . Abdominal surgery      Multiple  . Ventral hernia repair    . Nissen fundoplication  [id number]     complicated by gastric perforation, peritonitis, ventral nernia   . Lithotripsy      Right  . Brain surgery  March 2012    Trigeminal nerve  . Upper gastrointestinal endoscopy    . Hernia repair    . Rhizotomy Right 11/17/2014    Procedure: RHIZOTOMY TO RIGHT V2,V3;  Surgeon: Erline Levine, MD;  Location: [place] [facility];  Service: Neurosurgery;  Laterality: Right;  right  . Rhizotomy Right 12/22/2014    Procedure: Right V2 and V3 trigeminal rhysolysis;  Surgeon: Erline Levine, MD;  Location: [place] [facility];  Service: Neurosurgery;  Laterality: Right;  Right V2 and V3 trigeminal rhysolysis    Family History  Problem Relation Age of Onset  . Diabetes Mother   . Coronary artery disease Mother     CABG  . Diabetes Sister   . Diabetes Brother   . Hypertension Brother   . Colon cancer Neg Hx   . Esophageal cancer Neg Hx   . Stomach cancer Neg Hx   .  Clotting disorder Brother     History   Social History  . Marital Status: Married    Spouse Name: N/A  . Number of Children: 2  . Years of Education: N/A   Occupational History  . Disabled - psych    Social History Main Topics  . Smoking status: Former Smoker -- 1.00 packs/day for 20 years    Types: Cigarettes    Quit date: 11/06/1996  . Smokeless tobacco: Former Systems developer    Types: [place] date: 11/07/1983  . Alcohol Use: No  . Drug Use: No  . Sexual Activity: Not on file   Other Topics Concern  . None   Social History Narrative   Daily Caffeine Use:  Yes           Current outpatient prescriptions:  .  acetaminophen (TYLENOL) 325 MG tablet, Take 650 mg by mouth every 6 (six) hours as needed (pain)., Disp: , Rfl:  .  ALPRAZolam (XANAX) 1 MG tablet, Take 1 mg by mouth at bedtime as needed for anxiety. , Disp: , Rfl:  .  Cyanocobalamin (VITAMIN B-12 PO), Take  by mouth., Disp: , Rfl:  .  gabapentin (NEURONTIN) 400 MG capsule, Take 800 mg by mouth 2 (two) times daily. , Disp: , Rfl:  .  losartan-hydrochlorothiazide (HYZAAR) 50-12.5 MG per tablet, Take 1 tablet by mouth daily., Disp: 90 tablet, Rfl: 3 .  NON FORMULARY, TMLAE-all natural mens supplement, Disp: , Rfl:  .  omeprazole (PRILOSEC) 40 MG capsule, Take 1 capsule (40 mg total) by mouth daily., Disp: 90 capsule, Rfl: 3 .  polyethylene glycol powder (GLYCOLAX/MIRALAX) powder, DISSOLVE 1 CAPFUL IN LIQUID EVERY DAY AS NEEDED FOR CONSTIPATION, Disp: 527 g, Rfl: 2 .  sucralfate (CARAFATE) 1 G tablet, Take 1 g by mouth 4 (four) times daily as needed (acid reflux). , Disp: , Rfl:  .  tamsulosin (FLOMAX) 0.4 MG CAPS capsule, Take 0.4 mg by mouth 2 (two) times daily., Disp: , Rfl:  .  [DISCONTINUED] ferrous sulfate 325 (65 FE) MG tablet, Take 325 mg by mouth daily with breakfast.  , Disp: , Rfl:  .  [DISCONTINUED] losartan (COZAAR) 50 MG tablet, Take 1 tablet (50 mg total) by mouth daily., Disp: 30 tablet, Rfl: 6  EXAM:  Filed Vitals:   06/03/15 0950  BP: 122/78  Pulse: 76  Temp: 97.9 F (36.6 C)    Body mass index is 34.86 kg/(m^2).  GENERAL: vitals reviewed and listed above, alert, oriented, appears well hydrated and in no acute distress  HEENT: atraumatic, conjunttiva clear, no obvious abnormalities on inspection of external nose and ears  NECK: no obvious masses on inspection  LUNGS: clear to auscultation bilaterally, no wheezes, rales or rhonchi, good air movement  CV: HRRR, no peripheral edema  MS: moves all extremities without noticeable abnormality  PSYCH: pleasant and cooperative, no obvious depression or anxiety  ASSESSMENT AND PLAN:  Discussed the following assessment and plan:  Essential hypertension - Plan: Basic metabolic panel  BPH (benign prostatic hyperplasia), followed by Dr. Karsten Ro - Plan: PSA -advised to schedule follow up with urologist if  abnormal  Trigeminal neuralgia, s/p NSU, followed by Dr. Vertell Limber -feels neurotin makes him tired but doe not want to stop or change  Anxiety and depression, followed by Dr. Robina Ade in Psych  Low vitamin B12 level - Plan: Vitamin B12 Other fatigue - Plan: TSH, CBC with Differential -fatigue most likely from gabapentin given timing and now other symptoms -hx low  testosterone not on tx - advised f/u with urology -hx elevated PSA - followed by urology, checking today per his request -hx low B12, on injs in the past, bow on oral - check b12  offered Tdap - declined due to time  -Patient advised to return or notify a doctor immediately if symptoms worsen or persist or new concerns arise.  Patient Instructions  BEFORE YOU LEAVE: -labs -schedule follow up in 3 months  We recommend the following healthy lifestyle measures: - eat a healthy diet consisting of lots of vegetables, fruits, beans, nuts, seeds, healthy meats such as white chicken and fish  - avoid fried foods, fast food, processed foods, sodas, red meet and other fattening foods.  - get a least 150 minutes of aerobic exercise per week.       Jeffrey Benton R.

## 2015-06-04 ENCOUNTER — Encounter: Payer: Self-pay | Admitting: Internal Medicine

## 2015-06-11 ENCOUNTER — Ambulatory Visit: Payer: Medicare Other | Admitting: Internal Medicine

## 2015-07-28 DIAGNOSIS — H524 Presbyopia: Secondary | ICD-10-CM | POA: Diagnosis not present

## 2015-07-28 DIAGNOSIS — H01001 Unspecified blepharitis right upper eyelid: Secondary | ICD-10-CM | POA: Diagnosis not present

## 2015-07-28 DIAGNOSIS — H04123 Dry eye syndrome of bilateral lacrimal glands: Secondary | ICD-10-CM | POA: Diagnosis not present

## 2015-07-28 DIAGNOSIS — H43813 Vitreous degeneration, bilateral: Secondary | ICD-10-CM | POA: Diagnosis not present

## 2015-08-18 DIAGNOSIS — G500 Trigeminal neuralgia: Secondary | ICD-10-CM | POA: Diagnosis not present

## 2015-08-18 DIAGNOSIS — Z6834 Body mass index (BMI) 34.0-34.9, adult: Secondary | ICD-10-CM | POA: Diagnosis not present

## 2015-09-21 ENCOUNTER — Ambulatory Visit (INDEPENDENT_AMBULATORY_CARE_PROVIDER_SITE_OTHER): Payer: Medicare Other | Admitting: Family Medicine

## 2015-09-21 ENCOUNTER — Encounter: Payer: Self-pay | Admitting: Family Medicine

## 2015-09-21 VITALS — BP 118/82 | HR 84 | Temp 98.1°F | Ht 71.0 in | Wt 242.6 lb

## 2015-09-21 DIAGNOSIS — F418 Other specified anxiety disorders: Secondary | ICD-10-CM

## 2015-09-21 DIAGNOSIS — I10 Essential (primary) hypertension: Secondary | ICD-10-CM | POA: Diagnosis not present

## 2015-09-21 DIAGNOSIS — F32A Depression, unspecified: Secondary | ICD-10-CM

## 2015-09-21 DIAGNOSIS — K219 Gastro-esophageal reflux disease without esophagitis: Secondary | ICD-10-CM

## 2015-09-21 DIAGNOSIS — Z23 Encounter for immunization: Secondary | ICD-10-CM

## 2015-09-21 DIAGNOSIS — F419 Anxiety disorder, unspecified: Secondary | ICD-10-CM

## 2015-09-21 DIAGNOSIS — J449 Chronic obstructive pulmonary disease, unspecified: Secondary | ICD-10-CM

## 2015-09-21 DIAGNOSIS — N400 Enlarged prostate without lower urinary tract symptoms: Secondary | ICD-10-CM | POA: Diagnosis not present

## 2015-09-21 DIAGNOSIS — D649 Anemia, unspecified: Secondary | ICD-10-CM

## 2015-09-21 DIAGNOSIS — G500 Trigeminal neuralgia: Secondary | ICD-10-CM

## 2015-09-21 DIAGNOSIS — F329 Major depressive disorder, single episode, unspecified: Secondary | ICD-10-CM

## 2015-09-21 DIAGNOSIS — K449 Diaphragmatic hernia without obstruction or gangrene: Secondary | ICD-10-CM

## 2015-09-21 LAB — CBC WITH DIFFERENTIAL/PLATELET
BASOS PCT: 0.6 % (ref 0.0–3.0)
Basophils Absolute: 0 10*3/uL (ref 0.0–0.1)
EOS PCT: 0.5 % (ref 0.0–5.0)
Eosinophils Absolute: 0 10*3/uL (ref 0.0–0.7)
HCT: 38.7 % — ABNORMAL LOW (ref 39.0–52.0)
Hemoglobin: 12.3 g/dL — ABNORMAL LOW (ref 13.0–17.0)
LYMPHS ABS: 2.3 10*3/uL (ref 0.7–4.0)
Lymphocytes Relative: 30 % (ref 12.0–46.0)
MCHC: 31.7 g/dL (ref 30.0–36.0)
MCV: 74.5 fl — AB (ref 78.0–100.0)
MONO ABS: 0.5 10*3/uL (ref 0.1–1.0)
MONOS PCT: 6.6 % (ref 3.0–12.0)
NEUTROS ABS: 4.8 10*3/uL (ref 1.4–7.7)
NEUTROS PCT: 62.3 % (ref 43.0–77.0)
Platelets: 187 10*3/uL (ref 150.0–400.0)
RBC: 5.2 Mil/uL (ref 4.22–5.81)
RDW: 17.4 % — AB (ref 11.5–15.5)
WBC: 7.7 10*3/uL (ref 4.0–10.5)

## 2015-09-21 NOTE — Addendum Note (Signed)
Addended by: Agnes Lawrence on: 09/21/2015 04:31 PM   Modules accepted: Orders

## 2015-09-21 NOTE — Progress Notes (Signed)
Pre visit review using our clinic review tool, if applicable. No additional management support is needed unless otherwise documented below in the visit note. 

## 2015-09-21 NOTE — Progress Notes (Signed)
HPI:  HTN/Obesity: -meds: losartan-hctz 50-12.5 -has started working on regular exercise and a healthy diet  -denies: CP, SOB, dizziness on this dose  GAD: -this has worsened, seeing Dr. [name] but has stopped most meds other the prn xanax at night -increased worry and anxiety and fatigue  -denies worsening depression  Trigeminal neuralgia: -sees Dr. Vertell Limber -taking neurontin - this makes him tired no longer on tegretol - fatigue about 2 hours after taking this  Hx B12 def: -on shots in the past, now on oral -B12 great last check recently  Anemia: -on last set of labs, mild -advised GI eval as almost due for colonoscopy, he did not do this -denies: melena, hematochezia, changes in bowels, weakness, palpitations -hx GERD, hiatal hernia  Hx of elevated PSA/low testosterone: -reports PSA 7-9 in the past chronically -he sees Dr. Edwena Blow, [facility]  ROS: See pertinent positives and negatives per HPI.  Past Medical History  Diagnosis Date  . Anxiety and depression     sees Dr. [name]  . Hypertension     acei causes cough  . Hyperlipidemia   . HTN (hypertension)   . Trigeminal neuralgia     has facial asymetry  . IBS (irritable bowel syndrome)   . GERD (gastroesophageal reflux disease)     hiatal hernia  . Anemia, pernicious   . Elevated PSA     and hypogonadism, sees urologist  . Nephrolithiasis   . Allergic rhinitis   . Diverticulosis of colon   . Ventral hernia   . Hyperplastic colon polyp   . Rectal fissure   . Arthritis   . Hx of blood transfusion reaction   . Peritonitis ([place])     after surgery in 1999  . Hyperparathyroidism ([place])   . PONV (postoperative nausea and vomiting)   . Catheter-associated urinary tract infection ([place])   . Facial asymmetry   . Obstructive chronic bronchitis without exacerbation, followed by Dr. [name] 03/04/2009    ONO RA  Normal 07/28/11 6 min walk >53m no desat PFTs 08/08/11: DLCO 70% TLC 80%  FeV1 80%   Fef 25 75 56% with  significant improvement after BD     Past Surgical History  Procedure Laterality Date  . Hand surgery  1973    Left  . Knee surgery  1997    Left  . Cholecystectomy  1981  . Abdominal surgery      Multiple  . Ventral hernia repair    . Nissen fundoplication  [id number]     complicated by gastric perforation, peritonitis, ventral nernia   . Lithotripsy      Right  . Brain surgery  March 2012    Trigeminal nerve  . Upper gastrointestinal endoscopy    . Hernia repair    . Rhizotomy Right 11/17/2014    Procedure: RHIZOTOMY TO RIGHT V2,V3;  Surgeon: Erline Levine, MD;  Location: [facility];  Service: Neurosurgery;  Laterality: Right;  right  . Rhizotomy Right 12/22/2014    Procedure: Right V2 and V3 trigeminal rhysolysis;  Surgeon: Erline Levine, MD;  Location: [facility];  Service: Neurosurgery;  Laterality: Right;  Right V2 and V3 trigeminal rhysolysis    Family History  Problem Relation Age of Onset  . Diabetes Mother   . Coronary artery disease Mother     CABG  . Diabetes Sister   . Diabetes Brother   . Hypertension Brother   . Colon cancer Neg Hx   . Esophageal cancer Neg Hx   .  Stomach cancer Neg Hx   . Clotting disorder Brother     Social History   Social History  . Marital Status: Married    Spouse Name: N/A  . Number of Children: 2  . Years of Education: N/A   Occupational History  . Disabled - psych    Social History Main Topics  . Smoking status: Former Smoker -- 1.00 packs/day for 20 years    Types: Cigarettes    Quit date: 11/06/1996  . Smokeless tobacco: Former Systems developer    Types: [place] date: 11/07/1983  . Alcohol Use: No  . Drug Use: No  . Sexual Activity: Not Asked   Other Topics Concern  . None   Social History Narrative   Daily Caffeine Use:  Yes           Current outpatient prescriptions:  .  acetaminophen (TYLENOL) 325 MG tablet, Take 650 mg by mouth every 6 (six) hours as needed (pain)., Disp: , Rfl:  .  ALPRAZolam (XANAX) 1 MG  tablet, Take 1 mg by mouth at bedtime as needed for anxiety. , Disp: , Rfl:  .  Cyanocobalamin (VITAMIN B-12 PO), Take by mouth., Disp: , Rfl:  .  gabapentin (NEURONTIN) 400 MG capsule, Take 800 mg by mouth 2 (two) times daily. , Disp: , Rfl:  .  losartan-hydrochlorothiazide (HYZAAR) 50-12.5 MG per tablet, Take 1 tablet by mouth daily., Disp: 90 tablet, Rfl: 3 .  Multiple Vitamins-Minerals (ZINC PO), Take by mouth., Disp: , Rfl:  .  NON FORMULARY, TMLAE-all natural mens supplement, Disp: , Rfl:  .  omeprazole (PRILOSEC) 40 MG capsule, Take 1 capsule (40 mg total) by mouth daily., Disp: 90 capsule, Rfl: 3 .  polyethylene glycol powder (GLYCOLAX/MIRALAX) powder, DISSOLVE 1 CAPFUL IN LIQUID EVERY DAY AS NEEDED FOR CONSTIPATION, Disp: 527 g, Rfl: 2 .  sucralfate (CARAFATE) 1 G tablet, Take 1 g by mouth 4 (four) times daily as needed (acid reflux). , Disp: , Rfl:  .  tamsulosin (FLOMAX) 0.4 MG CAPS capsule, Take 0.4 mg by mouth 2 (two) times daily., Disp: , Rfl:  .  [DISCONTINUED] ferrous sulfate 325 (65 FE) MG tablet, Take 325 mg by mouth daily with breakfast.  , Disp: , Rfl:  .  [DISCONTINUED] losartan (COZAAR) 50 MG tablet, Take 1 tablet (50 mg total) by mouth daily., Disp: 30 tablet, Rfl: 6  EXAM:  Filed Vitals:   09/21/15 0947  BP: 118/82  Pulse: 84  Temp: 98.1 F (36.7 C)    Body mass index is 33.85 kg/(m^2).  GENERAL: vitals reviewed and listed above, alert, oriented, appears well hydrated and in no acute distress  HEENT: atraumatic, conjunttiva clear, no obvious abnormalities on inspection of external nose and ears  NECK: no obvious masses on inspection  LUNGS: clear to auscultation bilaterally, no wheezes, rales or rhonchi, good air movement  CV: HRRR, no peripheral edema  MS: moves all extremities without noticeable abnormality  PSYCH: pleasant and cooperative, no obvious depression or anxiety  ASSESSMENT AND PLAN:  Discussed the following assessment and  plan:  Encounter for immunization  Anemia, unspecified anemia type - Plan: CBC with Differential  Essential hypertension  BPH (benign prostatic hyperplasia), followed by Dr. Karsten Ro  Obstructive chronic bronchitis without exacerbation, followed by Dr. [name]  Gastroesophageal reflux disease, esophagitis presence not specified  Trigeminal neuralgia, s/p NSU, followed by Dr. Vertell Limber  Anxiety and depression, followed by Dr. [name] in Psych  Hiatal hernia, followed by Dr. Hassell Done ([place])  and Dr. Olevia Perches ([facility])  -flu shot -congratulated on lifestyle changes -recheck cbc, if anemic advised he should see GI -Patient advised to return or notify a doctor immediately if symptoms worsen or persist or new concerns arise.  Patient Instructions  BEFORE YOU LEAVE: -labs -follow up in 4-6 months  -We have ordered labs or studies at this visit. It can take up to 1-2 weeks for results and processing. We will contact you with instructions IF your results are abnormal. Normal results will be released to your [facility]. If you have not heard from [place] or can not find your results in [facility] in 2 weeks please contact our office.  We recommend the following healthy lifestyle measures: - eat a healthy whole foods diet consisting of regular small meals composed of vegetables, fruits, beans, nuts, seeds, healthy meats such as white chicken and fish and whole grains.  - avoid sweets, white starchy foods, fried foods, fast food, processed foods, sodas, red meet and other fattening foods.  - get a least 150-300 minutes of aerobic exercise per week.             Jeffrey Benton R.

## 2015-09-21 NOTE — Patient Instructions (Signed)
BEFORE YOU LEAVE: -labs -follow up in 4-6 months  -We have ordered labs or studies at this visit. It can take up to 1-2 weeks for results and processing. We will contact you with instructions IF your results are abnormal. Normal results will be released to your MYCHART. If you have not heard from us or can not find your results in MYCHART in 2 weeks please contact our office.  We recommend the following healthy lifestyle measures: - eat a healthy whole foods diet consisting of regular small meals composed of vegetables, fruits, beans, nuts, seeds, healthy meats such as white chicken and fish and whole grains.  - avoid sweets, white starchy foods, fried foods, fast food, processed foods, sodas, red meet and other fattening foods.  - get a least 150-300 minutes of aerobic exercise per week.        

## 2015-09-22 ENCOUNTER — Encounter: Payer: Self-pay | Admitting: Gastroenterology

## 2015-09-22 ENCOUNTER — Other Ambulatory Visit: Payer: Self-pay | Admitting: Physician Assistant

## 2015-09-22 DIAGNOSIS — L719 Rosacea, unspecified: Secondary | ICD-10-CM | POA: Diagnosis not present

## 2015-09-22 DIAGNOSIS — L821 Other seborrheic keratosis: Secondary | ICD-10-CM | POA: Diagnosis not present

## 2015-09-22 DIAGNOSIS — D485 Neoplasm of uncertain behavior of skin: Secondary | ICD-10-CM | POA: Diagnosis not present

## 2015-09-28 ENCOUNTER — Encounter: Payer: Self-pay | Admitting: Gastroenterology

## 2015-09-28 ENCOUNTER — Other Ambulatory Visit (INDEPENDENT_AMBULATORY_CARE_PROVIDER_SITE_OTHER): Payer: Medicare Other

## 2015-09-28 ENCOUNTER — Ambulatory Visit (INDEPENDENT_AMBULATORY_CARE_PROVIDER_SITE_OTHER): Payer: Medicare Other | Admitting: Gastroenterology

## 2015-09-28 VITALS — BP 156/78 | HR 88 | Ht 71.0 in | Wt 245.2 lb

## 2015-09-28 DIAGNOSIS — Z79899 Other long term (current) drug therapy: Secondary | ICD-10-CM

## 2015-09-28 DIAGNOSIS — D649 Anemia, unspecified: Secondary | ICD-10-CM

## 2015-09-28 LAB — VITAMIN B12: Vitamin B-12: 274 pg/mL (ref 211–911)

## 2015-09-28 LAB — IBC PANEL
Iron: 26 ug/dL — ABNORMAL LOW (ref 42–165)
SATURATION RATIOS: 6.1 % — AB (ref 20.0–50.0)
Transferrin: 305 mg/dL (ref 212.0–360.0)

## 2015-09-28 LAB — IGA: IGA: 411 mg/dL — AB (ref 68–378)

## 2015-09-28 LAB — FERRITIN: FERRITIN: 5.3 ng/mL — AB (ref 22.0–322.0)

## 2015-09-28 LAB — FOLATE: Folate: >23.8 ng/mL (ref >5.9)

## 2015-09-28 MED ORDER — NA SULFATE-K SULFATE-MG SULF 17.5-3.13-1.6 GM/177ML PO SOLN: 1.0000 | ORAL | Status: AC

## 2015-09-28 NOTE — Patient Instructions (Addendum)
Your physician has requested that you go to the basement for lab work before leaving today  You have been scheduled for an endoscopy and colonoscopy. Please follow the written instructions given to you at your visit today. Please pick up your prep supplies at the pharmacy within the next 1-3 days. If you use inhalers (even only as needed), please bring them with you on the day of your procedure. Your physician has requested that you go to www.startemmi.com and enter the access code given to you at your visit today. This web site gives a general overview about your procedure. However, you should still follow specific instructions given to you by our office regarding your preparation for the procedure.   

## 2015-09-29 LAB — TISSUE TRANSGLUTAMINASE, IGA: Tissue Transglutaminase Ab, IgA: 1 U/mL (ref <4)

## 2015-10-05 ENCOUNTER — Telehealth: Payer: Self-pay | Admitting: Gastroenterology

## 2015-10-05 NOTE — Telephone Encounter (Signed)
Spoke with the patient's spouse. Labs have not been reviewed yet and there are no recommendations to be given.

## 2015-10-05 NOTE — Telephone Encounter (Signed)
Spoke with patient about suprep   Wife will pick up prep kit today, also wife stated that nurse already contacted them about labs labs

## 2015-10-15 ENCOUNTER — Encounter: Payer: Self-pay | Admitting: Gastroenterology

## 2015-10-15 DIAGNOSIS — D649 Anemia, unspecified: Secondary | ICD-10-CM | POA: Insufficient documentation

## 2015-10-15 NOTE — Progress Notes (Signed)
Reviewed and agree with documentation and assessment and plan. K. Veena Nandigam , MD   

## 2015-10-15 NOTE — Progress Notes (Signed)
09/28/2015 [name] [id number] 08/05/1951   HISTORY OF PRESENT ILLNESS:  This is a 64 year old male who is presently known to Dr. Olevia Perches. He had an EGD in August 2012 at which time he was found have a 3 cm angulated hiatal hernia with Cameron's erosions; gastric biopsies showed presence of hyperplastic gastric polyp and some gastritis without H. pylori. Last colonoscopy was in October 2007 at which time his found have diverticulosis and rectal polyp removed that was hyperplastic.  He presents to our office today at the request of his PCP, Dr. Maudie Mercury for evaluation of anemia (has history of pernicious anemia).  He has not been hemocculted and no iron studies, etc have been performed, but had a 4 g drop in his hemoglobin compared to 10 months ago (Hgb 16.2 grams 10 months ago and 12.3 grams now).  He is not seeing any black or bloody stools. He takes omeprazole 40 mg daily and uses Carafate as needed for acid reflux.   Past Medical History  Diagnosis Date  . Anxiety and depression     sees Dr. [name]  . Hypertension     acei causes cough  . Hyperlipidemia   . HTN (hypertension)   . Trigeminal neuralgia     has facial asymetry  . IBS (irritable bowel syndrome)   . GERD (gastroesophageal reflux disease)     hiatal hernia  . Anemia, pernicious   . Elevated PSA     and hypogonadism, sees urologist  . Nephrolithiasis   . Allergic rhinitis   . Diverticulosis of colon   . Ventral hernia   . Hyperplastic colon polyp   . Rectal fissure   . Arthritis   . Hx of blood transfusion reaction   . Peritonitis ([place])     after surgery in 1999  . Hyperparathyroidism ([place])   . PONV (postoperative nausea and vomiting)   . Catheter-associated urinary tract infection ([place])   . Facial asymmetry   . Obstructive chronic bronchitis without exacerbation, followed by Dr. [name] 03/04/2009    ONO RA  Normal 07/28/11 6 min walk >570mno desat PFTs 08/08/11: DLCO 70% TLC 80%  FeV1 80%   Fef 25 75  56% with significant improvement after BD    Past Surgical History  Procedure Laterality Date  . Hand surgery  1973    Left  . Knee surgery  1997    Left  . Cholecystectomy  1981  . Abdominal surgery      Multiple  . Ventral hernia repair    . Nissen fundoplication  [id number]    complicated by gastric perforation, peritonitis, ventral nernia   . Lithotripsy      Right  . Brain surgery  March 2012    Trigeminal nerve  . Upper gastrointestinal endoscopy    . Hernia repair    . Rhizotomy Right 11/17/2014    Procedure: RHIZOTOMY TO RIGHT V2,V3;  Surgeon: [name] MD;  [facility];  Service: Neurosurgery;  Laterality: Right;  right  . Rhizotomy Right 12/22/2014    Procedure: Right V2 and V3 trigeminal rhysolysis;  Surgeon: [name] MD;  [facility];  Service: Neurosurgery;  Laterality: Right;  Right V2 and V3 trigeminal rhysolysis    reports that he quit smoking about 18 years ago. His smoking use included Cigarettes. He has a 20 pack-year smoking history. He quit smokeless tobacco use about 31 years ago. His smokeless tobacco use included Chew. He reports that  he does not drink alcohol or use illicit drugs. family history includes Clotting disorder in his brother; Coronary artery disease in his mother; Diabetes in his brother, mother, and sister; Hypertension in his brother. There is no history of Colon cancer, Esophageal cancer, or Stomach cancer. Allergies  Allergen Reactions  . Ace Inhibitors Cough  . Codeine Nausea And Vomiting  . Sulfa Antibiotics     hives      Outpatient Encounter Prescriptions as of 09/28/2015  Medication Sig  . acetaminophen (TYLENOL) 325 MG tablet Take 650 mg by mouth every 6 (six) hours as needed (pain).  Marland Kitchen ALPRAZolam (XANAX) 1 MG tablet Take 1 mg by mouth at bedtime as needed for anxiety.   . gabapentin (NEURONTIN) 400 MG capsule Take 800 mg by mouth 2 (two) times daily.   Marland Kitchen losartan-hydrochlorothiazide (HYZAAR) 50-12.5 MG  per tablet Take 1 tablet by mouth daily.  . Multiple Vitamins-Minerals (ZINC PO) Take by mouth.  . NON FORMULARY TMLAE-all natural mens supplement  . omeprazole (PRILOSEC) 40 MG capsule Take 1 capsule (40 mg total) by mouth daily.  . polyethylene glycol powder (GLYCOLAX/MIRALAX) powder DISSOLVE 1 CAPFUL IN LIQUID EVERY DAY AS NEEDED FOR CONSTIPATION  . sucralfate (CARAFATE) 1 G tablet Take 1 g by mouth 4 (four) times daily as needed (acid reflux).   . tamsulosin (FLOMAX) 0.4 MG CAPS capsule Take 0.4 mg by mouth 2 (two) times daily.  . Na Sulfate-K Sulfate-Mg Sulf SOLN Take 1 kit by mouth as directed.  . [DISCONTINUED] Cyanocobalamin (VITAMIN B-12 PO) Take by mouth.   No facility-administered encounter medications on file as of 09/28/2015.     REVIEW OF SYSTEMS  : All other systems reviewed and negative except where noted in the History of Present Illness.   PHYSICAL EXAM: BP 156/78 mmHg  Pulse 88  Ht 5' 11"  (1.803 m)  Wt 245 lb 3.2 oz (111.222 kg)  BMI 34.21 kg/m2 General: Well developed white male in no acute distress Head: Normocephalic and atraumatic Eyes:  Sclerae anicteric, conjunctiva pink. Ears: Normal auditory acuity Lungs: Clear throughout to auscultation Heart: Regular rate and rhythm Abdomen: Soft, non-distended.  Normal bowel sounds.  Non-tender. Rectal:  Will be done at the time of colonoscopy. Musculoskeletal: Symmetrical with no gross deformities  Skin: No lesions on visible extremities Extremities: No edema  Neurological: Alert oriented x 4, grossly non-focal Psychological:  Alert and cooperative. Normal mood and affect  ASSESSMENT AND PLAN: -Anemia:  He has history of pernicious anemia, but he may very well have some slow chronic GI blood loss from Cameron's erosions. His last colonoscopy was 9 years ago, however. He has not been hemocculted, but had a 4 g drop in his hemoglobin compared to 10 months ago, without signs of overt GI bleeding. We'll schedule for  colonoscopy with Dr. Silverio Decamp as well as repeat EGD.  We will also check labs including iron studies, vitamin B12, folate, and celiac labs.  The risks, benefits, and alternatives to EGD and colonoscopy were discussed with the patient and he consents to proceed.   CC:  Lucretia Kern, DO

## 2015-10-21 DIAGNOSIS — N401 Enlarged prostate with lower urinary tract symptoms: Secondary | ICD-10-CM | POA: Diagnosis not present

## 2015-10-21 DIAGNOSIS — N419 Inflammatory disease of prostate, unspecified: Secondary | ICD-10-CM | POA: Diagnosis not present

## 2015-10-21 DIAGNOSIS — E291 Testicular hypofunction: Secondary | ICD-10-CM | POA: Diagnosis not present

## 2015-10-21 DIAGNOSIS — N138 Other obstructive and reflux uropathy: Secondary | ICD-10-CM | POA: Diagnosis not present

## 2015-10-21 DIAGNOSIS — R972 Elevated prostate specific antigen [PSA]: Secondary | ICD-10-CM | POA: Diagnosis not present

## 2015-10-21 DIAGNOSIS — R300 Dysuria: Secondary | ICD-10-CM | POA: Diagnosis not present

## 2015-11-22 ENCOUNTER — Encounter: Payer: Self-pay | Admitting: Gastroenterology

## 2015-11-22 ENCOUNTER — Ambulatory Visit (AMBULATORY_SURGERY_CENTER): Payer: Medicare Other | Admitting: Gastroenterology

## 2015-11-22 VITALS — BP 152/88 | HR 76 | Temp 96.7°F | Resp 16 | Ht 71.0 in | Wt 245.0 lb

## 2015-11-22 DIAGNOSIS — D123 Benign neoplasm of transverse colon: Secondary | ICD-10-CM

## 2015-11-22 DIAGNOSIS — D120 Benign neoplasm of cecum: Secondary | ICD-10-CM | POA: Diagnosis not present

## 2015-11-22 DIAGNOSIS — D122 Benign neoplasm of ascending colon: Secondary | ICD-10-CM | POA: Diagnosis not present

## 2015-11-22 DIAGNOSIS — D649 Anemia, unspecified: Secondary | ICD-10-CM

## 2015-11-22 DIAGNOSIS — K254 Chronic or unspecified gastric ulcer with hemorrhage: Secondary | ICD-10-CM | POA: Diagnosis not present

## 2015-11-22 DIAGNOSIS — D128 Benign neoplasm of rectum: Secondary | ICD-10-CM | POA: Diagnosis not present

## 2015-11-22 DIAGNOSIS — D125 Benign neoplasm of sigmoid colon: Secondary | ICD-10-CM

## 2015-11-22 MED ORDER — SODIUM CHLORIDE 0.9 % IV SOLN: 500.0000 mL | INTRAVENOUS | Status: DC

## 2015-11-22 NOTE — Patient Instructions (Signed)
AVOID NSAIDS FOR 2 WEEKS.   REPEAT COLONOSCOPY IN 1 YEAR. Hiatal hernia seen, polyps removed, diverticulosis and hemorrhoids seen.  Handouts given on polyps, diverticulosis and hemorrhoids.    YOU HAD AN ENDOSCOPIC PROCEDURE TODAY AT [facility]:   Refer to the procedure report that was given to you for any specific questions about what was found during the examination.  If the procedure report does not answer your questions, please call your gastroenterologist to clarify.  If you requested that your care partner not be given the details of your procedure findings, then the procedure report has been included in a sealed envelope for you to review at your convenience later.  YOU SHOULD EXPECT: Some feelings of bloating in the abdomen. Passage of more gas than usual.  Walking can help get rid of the air that was put into your GI tract during the procedure and reduce the bloating. If you had a lower endoscopy (such as a colonoscopy or flexible sigmoidoscopy) you may notice spotting of blood in your stool or on the toilet paper. If you underwent a bowel prep for your procedure, you may not have a normal bowel movement for a few days.  Please Note:  You might notice some irritation and congestion in your nose or some drainage.  This is from the oxygen used during your procedure.  There is no need for concern and it should clear up in a day or so.  SYMPTOMS TO REPORT IMMEDIATELY:   Following lower endoscopy (colonoscopy or flexible sigmoidoscopy):  Excessive amounts of blood in the stool  Significant tenderness or worsening of abdominal pains  Swelling of the abdomen that is new, acute  Fever of 100F or higher   Following upper endoscopy (EGD)  Vomiting of blood or coffee ground material  New chest pain or pain under the shoulder blades  Painful or persistently difficult swallowing  New shortness of breath  Fever of 100F or higher  Black, tarry-looking stools  For urgent or  emergent issues, a gastroenterologist can be reached at any hour by calling [phone].   DIET: Your first meal following the procedure should be a small meal and then it is ok to progress to your normal diet. Heavy or fried foods are harder to digest and may make you feel nauseous or bloated.  Likewise, meals heavy in dairy and vegetables can increase bloating.  Drink plenty of fluids but you should avoid alcoholic beverages for 24 hours.  ACTIVITY:  You should plan to take it easy for the rest of today and you should NOT DRIVE or use heavy machinery until tomorrow (because of the sedation medicines used during the test).    FOLLOW UP: Our staff will call the number listed on your records the next business day following your procedure to check on you and address any questions or concerns that you may have regarding the information given to you following your procedure. If we do not reach you, we will leave a message.  However, if you are feeling well and you are not experiencing any problems, there is no need to return our call.  We will assume that you have returned to your regular daily activities without incident.  If any biopsies were taken you will be contacted by phone or by letter within the next 1-3 weeks.  Please call us at [phone] if you have not heard about the biopsies in 3 weeks.    SIGNATURES/CONFIDENTIALITY: You and/or your care partner  have signed paperwork which will be entered into your electronic medical record.  These signatures attest to the fact that that the information above on your After Visit Summary has been reviewed and is understood.  Full responsibility of the confidentiality of this discharge information lies with you and/or your care-partner.

## 2015-11-22 NOTE — Progress Notes (Signed)
To recovery, report to Myers, RN, VSS. 

## 2015-11-22 NOTE — Op Note (Signed)
[facility]  [address] [place], [address]   ENDOSCOPY PROCEDURE REPORT  PATIENT: [name], [name]  MR#: [id number] BIRTHDATE: August 15, 1951 , 59  yrs. old GENDER: male ENDOSCOPIST: Harl Bowie, MD REFERRED BY:  Colin Benton DO PROCEDURE DATE:  11/22/2015 PROCEDURE:  EGD, diagnostic and EGD w/ control of bleeding ASA CLASS:     Class II INDICATIONS:  unexplained iron deficiency anemia. MEDICATIONS: Propofol 270 mg IV TOPICAL ANESTHETIC: none  DESCRIPTION OF PROCEDURE: After the risks benefits and alternatives of the procedure were thoroughly explained, informed consent was obtained.  The LB [id number] endoscope was introduced through the mouth and advanced to the second portion of the duodenum , Without limitations.  The instrument was slowly withdrawn as the mucosa was fully examined.   Esophageal mucosa appeared normal, squamocolumnar junction at 38 cm. Large paraesophageal hiatal hernia about 7 cm in size.  Active oozing of blood noted along the greater curvature near the hiatus consistent with Lysbeth Galas erosions, was treated with cautery with hemostasis.  otherwise the gastric mucosa appeared normal. Duodenum appeared normal.  Retroflexed views revealed a hiatal hernia and Retroflexed views revealed .     The scope was then withdrawn from the patient and the procedure completed.  COMPLICATIONS: There were no immediate complications.  ENDOSCOPIC IMPRESSION: Esophageal mucosa appeared normal, squamocolumnar junction at 38 cm. Large paraesophageal hiatal hernia about 7 cm in size.  Active oozing of blood noted along the greater curvature near the hiatus consistent with Lysbeth Galas erosions, was treated with cautery with hemostasis.  otherwise the gastric mucosa appeared normal. Duodenum appeared normal  RECOMMENDATIONS: 1.  Avoid NSAIDS for two weeks 2.  Continue PPI 3.  Consider surgical consult for repair of hiatal hernia given persistent  Cameron erosions and significant anemia    eSigned:  Harl Bowie, MD 11/22/2015 2:31 PM

## 2015-11-22 NOTE — Progress Notes (Signed)
Called to room to assist during endoscopic procedure.  Patient ID and intended procedure confirmed with present staff. Received instructions for my participation in the procedure from the performing physician.  

## 2015-11-22 NOTE — Op Note (Signed)
[place]  [employer]. [place], [address]   COLONOSCOPY PROCEDURE REPORT  PATIENT: Jeffrey, Morgan  MR#: [id number] BIRTHDATE: Mar 10, 1951 , 64  yrs. old GENDER: male ENDOSCOPIST: Harl Bowie, MD REFERRED [id number] Maudie Mercury, [place]:  11/22/2015 PROCEDURE:   Colonoscopy, diagnostic, Colonoscopy with cold biopsy polypectomy, Colonoscopy with control of bleeding, and Colonoscopy with snare polypectomy First Screening Colonoscopy - Avg.  risk and is 50 yrs.  old or older - No.  Prior Negative Screening - Now for repeat screening. 10 or more years since last screening  History of Adenoma - Now for follow-up colonoscopy & has been > or = to 3 yrs.  Yes hx of adenoma.  Has been 3 or more years since last colonoscopy.  Polyps removed today? Yes ASA CLASS:   Class II INDICATIONS:Unexplained iron deficiency anemia. MEDICATIONS: Propofol 530 mg IV  DESCRIPTION OF PROCEDURE:   After the risks benefits and alternatives of the procedure were thoroughly explained, informed consent was obtained.  The digital rectal exam revealed no abnormalities of the rectum.   The LB [id number]  endoscope was introduced through the anus and advanced to the cecum, which was identified by both the appendix and ileocecal valve. No adverse events experienced.   The quality of the prep was adequate  The instrument was then slowly withdrawn as the colon was fully examined. Estimated blood loss is zero unless otherwise noted in this procedure report.   COLON FINDINGS: 11mmsessile polyp in cecum near appendiceal orifice removed by biopsy forceps.  about 2.5-3 cm sessile polyp in the ascending colon removed by hot snare, there was active oozing of blood from the polypectomy site, hemostasis achieved after placement of 2 for hempstatic clips.  4 mm polyp in splenic flexure and a 5 mm polyp in sigmoid colon removed by cold snare.  Nodular lesion/polypoid mucosa in the  rectum biopsied.   There was moderate diverticulosis noted in the sigmoid colon and descending colon. Small internal hemorrhoids were found.  Retroflexed views revealed internal hemorrhoids. The time to cecum = 6.4 Withdrawal time = 27.8   The scope was withdrawn and the procedure completed. COMPLICATIONS: There were no immediate complications.  ENDOSCOPIC IMPRESSION: 1.   34mmsessile polyp in cecum near appendiceal orifice removed by biopsy forceps.  about 2.5-3 cm sessile polyp in the ascending colon removed by hot snare, there was active oozing of blood from the polypectomy site, hemostasis achieved after placement of 2 for hempstatic clips.  4 mm polyp in splenic flexure and a 5 mm polyp in sigmoid colon removed by cold snare.  Nodular lesion/polypoid mucosa in the rectum biopsied 2.   Moderate diverticulosis was noted in the sigmoid colon and descending colon 3.   Small internal hemorrhoids  RECOMMENDATIONS: 1.  You will need a colonoscopy in 1 year.    You will receive a letter within 1-2 weeks with the results of your biopsy as well as final recommendations.  Please call my office if you have not received a letter after 3 weeks. 2.  Await pathology results 3. Avoid NSAID  eSigned:  Harl Bowie, MD 11/22/2015 2:37 PM     PATIENT NAME:  Jeffrey, Morgan MR#: [id number]

## 2015-11-22 NOTE — Progress Notes (Signed)
Dr. Silverio Decamp used cautery on oozing erosion site to hiatal hernia

## 2015-11-23 ENCOUNTER — Telehealth: Payer: Self-pay

## 2015-11-23 NOTE — Telephone Encounter (Signed)
  Follow up Call-  Call back number 11/22/2015  Post procedure Call Back phone  # [phone]  Permission to leave phone message Yes   All questions were answered by the patients wife.   Patient questions:  Do you have a fever, pain , or abdominal swelling? No. Pain Score  0 *  Have you tolerated food without any problems? Yes.    Have you been able to return to your normal activities? Yes.    Do you have any questions about your discharge instructions: Diet   No. Medications  No. Follow up visit  No.  Do you have questions or concerns about your Care? No.  Actions: * If pain score is 4 or above: No action needed, pain <4.

## 2015-11-25 ENCOUNTER — Telehealth: Payer: Self-pay | Admitting: Family Medicine

## 2015-11-25 NOTE — Telephone Encounter (Signed)
[place] with me if ok with Dr. [name]. Let them know I will miss them.

## 2015-11-25 NOTE — Telephone Encounter (Signed)
Patient is requesting to transfer from [name] to [name] because all MD's are over this way.  Please advise.

## 2015-11-26 NOTE — Telephone Encounter (Signed)
Got scheduled with [name] °

## 2015-12-01 ENCOUNTER — Encounter: Payer: Self-pay | Admitting: Gastroenterology

## 2015-12-09 DIAGNOSIS — D649 Anemia, unspecified: Secondary | ICD-10-CM | POA: Diagnosis not present

## 2015-12-09 DIAGNOSIS — K279 Peptic ulcer, site unspecified, unspecified as acute or chronic, without hemorrhage or perforation: Secondary | ICD-10-CM | POA: Diagnosis not present

## 2015-12-21 NOTE — Telephone Encounter (Signed)
Duplicated

## 2016-01-13 ENCOUNTER — Ambulatory Visit (INDEPENDENT_AMBULATORY_CARE_PROVIDER_SITE_OTHER): Payer: Medicare Other | Admitting: Internal Medicine

## 2016-01-13 ENCOUNTER — Other Ambulatory Visit (INDEPENDENT_AMBULATORY_CARE_PROVIDER_SITE_OTHER): Payer: Medicare Other

## 2016-01-13 ENCOUNTER — Encounter: Payer: Self-pay | Admitting: Internal Medicine

## 2016-01-13 VITALS — BP 116/78 | HR 58 | Temp 97.6°F | Resp 20 | Wt 236.0 lb

## 2016-01-13 DIAGNOSIS — K219 Gastro-esophageal reflux disease without esophagitis: Secondary | ICD-10-CM

## 2016-01-13 DIAGNOSIS — J449 Chronic obstructive pulmonary disease, unspecified: Secondary | ICD-10-CM

## 2016-01-13 DIAGNOSIS — Z0000 Encounter for general adult medical examination without abnormal findings: Secondary | ICD-10-CM

## 2016-01-13 DIAGNOSIS — I10 Essential (primary) hypertension: Secondary | ICD-10-CM

## 2016-01-13 DIAGNOSIS — Z0001 Encounter for general adult medical examination with abnormal findings: Secondary | ICD-10-CM | POA: Diagnosis not present

## 2016-01-13 LAB — TSH: TSH: 1.62 u[IU]/mL (ref 0.35–4.50)

## 2016-01-13 LAB — CBC WITH DIFFERENTIAL/PLATELET
BASOS ABS: 0 10*3/uL (ref 0.0–0.1)
Basophils Relative: 0.5 % (ref 0.0–3.0)
EOS PCT: 1.1 % (ref 0.0–5.0)
Eosinophils Absolute: 0.1 10*3/uL (ref 0.0–0.7)
HEMATOCRIT: 50 % (ref 39.0–52.0)
HEMOGLOBIN: 16.9 g/dL (ref 13.0–17.0)
LYMPHS ABS: 2.7 10*3/uL (ref 0.7–4.0)
LYMPHS PCT: 29.7 % (ref 12.0–46.0)
MCHC: 33.9 g/dL (ref 30.0–36.0)
MCV: 84.2 fl (ref 78.0–100.0)
MONOS PCT: 5.7 % (ref 3.0–12.0)
Monocytes Absolute: 0.5 10*3/uL (ref 0.1–1.0)
Neutro Abs: 5.8 10*3/uL (ref 1.4–7.7)
Neutrophils Relative %: 63 % (ref 43.0–77.0)
Platelets: 160 10*3/uL (ref 150.0–400.0)
RBC: 5.94 Mil/uL — AB (ref 4.22–5.81)
RDW: 20.3 % — ABNORMAL HIGH (ref 11.5–15.5)
WBC: 9.2 10*3/uL (ref 4.0–10.5)

## 2016-01-13 LAB — BASIC METABOLIC PANEL
BUN: 13 mg/dL (ref 6–23)
CALCIUM: 9.7 mg/dL (ref 8.4–10.5)
CHLORIDE: 103 meq/L (ref 96–112)
CO2: 32 meq/L (ref 19–32)
Creatinine, Ser: 1.04 mg/dL (ref 0.40–1.50)
GFR: 76.52 mL/min (ref >60.00)
GLUCOSE: 96 mg/dL (ref 70–99)
Potassium: 4.7 mEq/L (ref 3.5–5.1)
SODIUM: 142 meq/L (ref 135–145)

## 2016-01-13 LAB — LIPID PANEL
CHOL/HDL RATIO: 3
Cholesterol: 149 mg/dL (ref 0–200)
HDL: 45.7 mg/dL (ref >39.00)
LDL CALC: 80 mg/dL (ref 0–99)
NONHDL: 103.43
TRIGLYCERIDES: 115 mg/dL (ref 0.0–149.0)
VLDL: 23 mg/dL (ref 0.0–40.0)

## 2016-01-13 LAB — URINALYSIS, ROUTINE W REFLEX MICROSCOPIC
BILIRUBIN URINE: NEGATIVE
HGB URINE DIPSTICK: NEGATIVE
KETONES UR: NEGATIVE
LEUKOCYTES UA: NEGATIVE
NITRITE: NEGATIVE
PH: 6 (ref 5.0–8.0)
Specific Gravity, Urine: 1.015 (ref 1.000–1.030)
Total Protein, Urine: NEGATIVE
UROBILINOGEN UA: 0.2 (ref 0.0–1.0)
Urine Glucose: NEGATIVE

## 2016-01-13 LAB — HEPATIC FUNCTION PANEL
ALBUMIN: 4.3 g/dL (ref 3.5–5.2)
ALK PHOS: 64 U/L (ref 39–117)
ALT: 13 U/L (ref 0–53)
AST: 16 U/L (ref 0–37)
Bilirubin, Direct: 0.1 mg/dL (ref 0.0–0.3)
TOTAL PROTEIN: 7.3 g/dL (ref 6.0–8.3)
Total Bilirubin: 0.7 mg/dL (ref 0.2–1.2)

## 2016-01-13 MED ORDER — ESOMEPRAZOLE MAGNESIUM 40 MG PO CPDR: 40.0000 mg | DELAYED_RELEASE_CAPSULE | Freq: Every day | ORAL | Status: DC

## 2016-01-13 MED ORDER — ASPIRIN EC 81 MG PO TBEC: 81.0000 mg | DELAYED_RELEASE_TABLET | Freq: Every day | ORAL | Status: DC

## 2016-01-13 NOTE — Patient Instructions (Addendum)
Please start taking Aspirin 81 mg - 1 per day   You had the Tdap tetanus shot today  Ok to stop the losartan-HCT for now, as well as the prilosec  Please take all new medication as prescribed - the nexium  Continue to monitor BP at home as you do, and call for consistently elevated BP > 140/90  Please continue all other medications as before, and refills have been done if requested.  Please have the pharmacy call with any other refills you may need.  Please continue your efforts at being more active, low cholesterol diet, and weight control.  You are otherwise up to date with prevention measures today.  Please keep your appointments with your specialists as you may have planned  Please go to the LAB in the Basement (turn left off the elevator) for the tests to be done today  You will be contacted by phone if any changes need to be made immediately.  Otherwise, you will receive a letter about your results with an explanation, but please check with MyChart first.  Please remember to sign up for MyChart if you have not done so, as this will be important to you in the future with finding out test results, communicating by private email, and scheduling acute appointments online when needed.  Please return in 3 months, or sooner if needed

## 2016-01-13 NOTE — Progress Notes (Signed)
Pre visit review using our clinic review tool, if applicable. No additional management support is needed unless otherwise documented below in the visit note. 

## 2016-01-13 NOTE — Assessment & Plan Note (Signed)

## 2016-01-13 NOTE — Progress Notes (Signed)
Subjective:    Patient ID: [name], male    DOB: [birth year], 65 y.o.   MRN: [id number]  HPI    Here for wellness and f/u;  Overall doing ok;  Pt denies Chest pain, worsening SOB, DOE, wheezing, orthopnea, PND, worsening LE edema, palpitations, dizziness or syncope.  Pt denies neurological change such as new headache, facial or extremity weakness.  Pt denies polydipsia, polyuria, or low sugar symptoms. Pt states overall good compliance with treatment and medications, good tolerability, and has been trying to follow appropriate diet.  Pt denies worsening depressive symptoms, suicidal ideation or panic. No fever, night sweats, wt loss, loss of appetite, or other constitutional symptoms.  Pt states good ability with ADL's, has low fall risk, home safety reviewed and adequate, no other significant changes in hearing or vision, and fairly active with exercise daily.  For some reason BP lower recently - SBP90-100 with dizziness recnetly. Has lost wt recently.  Only taking half of the losartan HCT.  BP Readings from Last 3 Encounters:  01/13/16 116/78  11/22/15 152/88  09/28/15 156/78   Wt Readings from Last 3 Encounters:  01/13/16 236 lb (107.049 kg)  11/22/15 245 lb (111.131 kg)  09/28/15 245 lb 3.2 oz (111.222 kg)   Not currently taking asa but willing to start. Hs been considered for repeat GI related surgury but would be higher risk per pt, so not attempted locally  Hxicncluded difficult nissen 1999, now followed by Dr Tori Milks Dr Kaur/psychiatry.   Also has hx of teflon per Dr Vertell Limber - trigeminal neuralgia, also on gabapentin, has chronic discomfort.  S/p trach with nissen 1999. S/p prostate biopsy in last 10 yrs - neg for malignancy despite elev PSA.  Next appt may 2017, pt will have PSA's done there every 6 mo  Denies worsening reflux, abd pain, dysphagia, n/v, bowel change or blood, except for recent prilosec change from nexium not working as well, asks for change back Past  Medical History  Diagnosis Date  . Anxiety and depression     sees Dr. [name]  . Hypertension     acei causes cough  . Hyperlipidemia   . HTN (hypertension)   . Trigeminal neuralgia     has facial asymetry  . IBS (irritable bowel syndrome)   . GERD (gastroesophageal reflux disease)     hiatal hernia  . Anemia, pernicious   . Elevated PSA     and hypogonadism, sees urologist  . Nephrolithiasis   . Allergic rhinitis   . Diverticulosis of colon   . Ventral hernia   . Hyperplastic colon polyp   . Rectal fissure   . Arthritis   . Hx of blood transfusion reaction   . Peritonitis ([place])     after surgery in 1999  . Hyperparathyroidism ([place])   . PONV (postoperative nausea and vomiting)   . Catheter-associated urinary tract infection ([place])   . Facial asymmetry   . Obstructive chronic bronchitis without exacerbation, followed by Dr. [name] 03/04/2009    ONO RA  Normal 07/28/11 6 min walk >523m no desat PFTs 08/08/11: DLCO 70% TLC 80%  FeV1 80%   Fef 25 75 56% with significant improvement after BD    Past Surgical History  Procedure Laterality Date  . Hand surgery  1973    Left  . Knee surgery  1997    Left  . Cholecystectomy  1981  . Abdominal surgery      Multiple  . Ventral hernia repair    .  Nissen fundoplication  [id number]     complicated by gastric perforation, peritonitis, ventral nernia   . Lithotripsy      Right  . Brain surgery  March 2012    Trigeminal nerve  . Upper gastrointestinal endoscopy    . Hernia repair    . Rhizotomy Right 11/17/2014    Procedure: RHIZOTOMY TO RIGHT V2,V3;  Surgeon: Erline Levine, MD;  Location: [facility];  Service: Neurosurgery;  Laterality: Right;  right  . Rhizotomy Right 12/22/2014    Procedure: Right V2 and V3 trigeminal rhysolysis;  Surgeon: Erline Levine, MD;  Location: [facility];  Service: Neurosurgery;  Laterality: Right;  Right V2 and V3 trigeminal rhysolysis    reports that he quit smoking about 19 years ago. His smoking use  included Cigarettes. He has a 20 pack-year smoking history. He quit smokeless tobacco use about 32 years ago. His smokeless tobacco use included Chew. He reports that he does not drink alcohol or use illicit drugs. family history includes Clotting disorder in his brother; Coronary artery disease in his mother; Diabetes in his brother, mother, and sister; Hypertension in his brother. There is no history of Colon cancer, Esophageal cancer, or Stomach cancer. Allergies  Allergen Reactions  . Ace Inhibitors Cough  . Codeine Nausea And Vomiting  . Sulfa Antibiotics     hives   Current Outpatient Prescriptions on File Prior to Visit  Medication Sig Dispense Refill  . acetaminophen (TYLENOL) 325 MG tablet Take 650 mg by mouth every 6 (six) hours as needed (pain).    Marland Kitchen ALPRAZolam (XANAX) 1 MG tablet Take 1 mg by mouth at bedtime as needed for anxiety.     . gabapentin (NEURONTIN) 400 MG capsule Take 800 mg by mouth 2 (two) times daily.     . Multiple Vitamins-Minerals (ZINC PO) Take by mouth.    . NON FORMULARY TMLAE-all natural mens supplement    . polyethylene glycol powder (GLYCOLAX/MIRALAX) powder DISSOLVE 1 CAPFUL IN LIQUID EVERY DAY AS NEEDED FOR CONSTIPATION 527 g 2  . sucralfate (CARAFATE) 1 G tablet Take 1 g by mouth 4 (four) times daily as needed (acid reflux).     . tamsulosin (FLOMAX) 0.4 MG CAPS capsule Take 0.4 mg by mouth 2 (two) times daily.    . [DISCONTINUED] ferrous sulfate 325 (65 FE) MG tablet Take 325 mg by mouth daily with breakfast.      . [DISCONTINUED] losartan (COZAAR) 50 MG tablet Take 1 tablet (50 mg total) by mouth daily. 30 tablet 6   No current facility-administered medications on file prior to visit.   Review of Systems  Constitutional: Negative for increased diaphoresis, other activity, appetite or siginficant weight change other than noted HENT: Negative for worsening hearing loss, ear pain, facial swelling, mouth sores and neck stiffness.   Eyes: Negative for  other worsening pain, redness or visual disturbance.  Respiratory: Negative for shortness of breath and wheezing  Cardiovascular: Negative for chest pain and palpitations.  Gastrointestinal: Negative for diarrhea, blood in stool, abdominal distention or other pain Genitourinary: Negative for hematuria, flank pain or change in urine volume.  Musculoskeletal: Negative for myalgias or other joint complaints.  Skin: Negative for color change and wound or drainage.  Neurological: Negative for syncope and numbness. other than noted Hematological: Negative for adenopathy. or other swelling Psychiatric/Behavioral: Negative for hallucinations, SI, self-injury, decreased concentration or other worsening agitation.     Objective:   Physical Exam BP 116/78 mmHg  Pulse 58  Temp(Src)  97.6 F (36.4 C) (Oral)  Resp 20  Wt 236 lb (107.049 kg)  SpO2 94% VS noted,  Constitutional: Pt is oriented to person, place, and time. Appears well-developed and well-nourished, in no significant distress Head: Normocephalic and atraumatic.  Right Ear: External ear normal.  Left Ear: External ear normal.  Nose: Nose normal.  Mouth/Throat: Oropharynx is clear and moist.  Eyes: Conjunctivae and EOM are normal. Pupils are equal, round, and reactive to light.  Neck: Normal range of motion. Neck supple. No JVD present. No tracheal deviation present or significant neck LA or mass Cardiovascular: Normal rate, regular rhythm, normal heart sounds and intact distal pulses.   Pulmonary/Chest: Effort normal and breath sounds without rales or wheezing  Abdominal: Soft. Bowel sounds are normal. NT. No HSM  Musculoskeletal: Normal range of motion. Exhibits no edema.  Lymphadenopathy:  Has no cervical adenopathy.  Neurological: Pt is alert and oriented to person, place, and time. Pt has normal reflexes. No cranial nerve deficit. Motor grossly intact Skin: Skin is warm and dry. No rash noted.  Psychiatric:  Has normal mood and  affect. Behavior is normal.     Assessment & Plan:

## 2016-01-15 NOTE — Assessment & Plan Note (Signed)
stable overall by history and exam, recent data reviewed with pt, and pt to continue medical treatment as before,  to f/u any worsening symptoms or concerns SpO2 Readings from Last 3 Encounters:  01/13/16 94%  11/22/15 98%  12/22/14 99%

## 2016-01-15 NOTE — Assessment & Plan Note (Signed)
stable overall by history and exam, recent data reviewed with pt, and pt to continue medical treatment as before,  to f/u any worsening symptoms or concerns BP Readings from Last 3 Encounters:  01/13/16 116/78  11/22/15 152/88  09/28/15 156/78

## 2016-01-15 NOTE — Assessment & Plan Note (Signed)
stable overall by history and exam, to change back to nexium asd, and pt to continue medical treatment as before,  to f/u any worsening symptoms or concerns

## 2016-01-17 ENCOUNTER — Telehealth: Payer: Self-pay

## 2016-01-17 MED ORDER — NEXIUM 40 MG PO CPDR: 40.0000 mg | DELAYED_RELEASE_CAPSULE | Freq: Every day | ORAL | Status: DC

## 2016-01-17 NOTE — Telephone Encounter (Signed)
Ok, but please remind patient that is will not likely be approved by insurance and we cannot gaurantee doing a PA for this will work out in his favor  We could change to generic protonix if he wants, thanks

## 2016-01-17 NOTE — Addendum Note (Signed)
Addended by: [name] on: 01/17/2016 12:27 PM   Modules accepted: Orders

## 2016-01-17 NOTE — Telephone Encounter (Signed)
Patient called in and stated that his pharmacy is needing a new prescription for the brand name of Nexium due to him not being able to take the generic. Please advise, patients pharmacy is [facility].

## 2016-01-17 NOTE — Telephone Encounter (Signed)
Pt wife Velva Harman) informed of rx.

## 2016-03-08 DIAGNOSIS — G500 Trigeminal neuralgia: Secondary | ICD-10-CM | POA: Diagnosis not present

## 2016-03-20 ENCOUNTER — Ambulatory Visit: Payer: Medicare Other | Admitting: Family Medicine

## 2016-04-18 DIAGNOSIS — R972 Elevated prostate specific antigen [PSA]: Secondary | ICD-10-CM | POA: Diagnosis not present

## 2016-04-25 DIAGNOSIS — R972 Elevated prostate specific antigen [PSA]: Secondary | ICD-10-CM | POA: Diagnosis not present

## 2016-06-29 DIAGNOSIS — K279 Peptic ulcer, site unspecified, unspecified as acute or chronic, without hemorrhage or perforation: Secondary | ICD-10-CM | POA: Diagnosis not present

## 2016-06-29 DIAGNOSIS — K449 Diaphragmatic hernia without obstruction or gangrene: Secondary | ICD-10-CM | POA: Diagnosis not present

## 2016-07-05 DIAGNOSIS — M1288 Other specific arthropathies, not elsewhere classified, other specified site: Secondary | ICD-10-CM | POA: Diagnosis not present

## 2016-07-14 ENCOUNTER — Ambulatory Visit (INDEPENDENT_AMBULATORY_CARE_PROVIDER_SITE_OTHER): Payer: Medicare Other | Admitting: Internal Medicine

## 2016-07-14 ENCOUNTER — Encounter: Payer: Self-pay | Admitting: Internal Medicine

## 2016-07-14 VITALS — BP 122/74 | HR 86 | Temp 98.4°F | Resp 20 | Wt 236.0 lb

## 2016-07-14 DIAGNOSIS — I10 Essential (primary) hypertension: Secondary | ICD-10-CM

## 2016-07-14 DIAGNOSIS — K219 Gastro-esophageal reflux disease without esophagitis: Secondary | ICD-10-CM | POA: Diagnosis not present

## 2016-07-14 DIAGNOSIS — J309 Allergic rhinitis, unspecified: Secondary | ICD-10-CM

## 2016-07-14 DIAGNOSIS — Z0001 Encounter for general adult medical examination with abnormal findings: Secondary | ICD-10-CM

## 2016-07-14 DIAGNOSIS — Z23 Encounter for immunization: Secondary | ICD-10-CM

## 2016-07-14 DIAGNOSIS — R6889 Other general symptoms and signs: Secondary | ICD-10-CM

## 2016-07-14 NOTE — Patient Instructions (Addendum)
You had the flu shot today  Please continue all other medications as before, and refills have been done if requested.  Please have the pharmacy call with any other refills you may need.  Please continue your efforts at being more active, low cholesterol diet, and weight control.  You are otherwise up to date with prevention measures today.  Please keep your appointments with your specialists as you may have planned  Please return in 6 months, or sooner if needed, with Lab testing done 3-5 days before  

## 2016-07-14 NOTE — Progress Notes (Signed)
Subjective:    Patient ID: [name], male    DOB: 1950-12-21, 65 y.o.   MRN: [id number]  HPI  Here to f/u; overall doing ok,  Pt denies chest pain, increasing sob or doe, wheezing, orthopnea, PND, increased LE swelling, palpitations, dizziness or syncope.  Pt denies new neurological symptoms such as new headache, or facial or extremity weakness or numbness.  Pt denies polydipsia, polyuria, or low sugar episode.   Pt denies new neurological symptoms such as new headache, or facial or extremity weakness or numbness.   Pt states overall good compliance with meds, mostly trying to follow appropriate diet, with wt overall stable.  Did have some sciatica last wk, seen per ortho last wk, tx with ESI, then prednisone. Now improved. Does have several wks ongoing nasal allergy symptoms with clearish congestion, itch and sneezing, without fever, pain, ST, cough, swelling or wheezing, but also improved with steroid tx  Denies worsening reflux, abd pain, dysphagia, n/v, bowel change or blood.  Past Medical History:  Diagnosis Date  . Allergic rhinitis   . Anemia, pernicious   . Anxiety and depression    sees Dr. [name]  . Arthritis   . Catheter-associated urinary tract infection ([place])   . Diverticulosis of colon   . Elevated PSA    and hypogonadism, sees urologist  . Facial asymmetry   . GERD (gastroesophageal reflux disease)    hiatal hernia  . HTN (hypertension)   . Hx of blood transfusion reaction   . Hyperlipidemia   . Hyperparathyroidism ([place])   . Hyperplastic colon polyp   . Hypertension    acei causes cough  . IBS (irritable bowel syndrome)   . Nephrolithiasis   . Obstructive chronic bronchitis without exacerbation, followed by Dr. [name] 03/04/2009   ONO RA  Normal 07/28/11 6 min walk >543m no desat PFTs 08/08/11: DLCO 70% TLC 80%  FeV1 80%   Fef 25 75 56% with significant improvement after BD   . Peritonitis ([place])    after surgery in 1999  . PONV (postoperative nausea and  vomiting)   . Rectal fissure   . Trigeminal neuralgia    has facial asymetry  . Ventral hernia    Past Surgical History:  Procedure Laterality Date  . ABDOMINAL SURGERY     Multiple  . BRAIN SURGERY  March 2012   Trigeminal nerve  . CHOLECYSTECTOMY  1981  . HAND SURGERY  1973   Left  . [place]   Left  . LITHOTRIPSY     Right  . NISSEN FUNDOPLICATION  [id number]    complicated by gastric perforation, peritonitis, ventral nernia   . RHIZOTOMY Right 11/17/2014   Procedure: RHIZOTOMY TO RIGHT V2,V3;  Surgeon: Erline Levine, MD;  Location: [facility];  Service: Neurosurgery;  Laterality: Right;  right  . RHIZOTOMY Right 12/22/2014   Procedure: Right V2 and V3 trigeminal rhysolysis;  Surgeon: Erline Levine, MD;  Location: [facility];  Service: Neurosurgery;  Laterality: Right;  Right V2 and V3 trigeminal rhysolysis  . UPPER GASTROINTESTINAL ENDOSCOPY    . VENTRAL HERNIA REPAIR      reports that he quit smoking about 19 years ago. His smoking use included Cigarettes. He has a 20.00 pack-year smoking history. He quit smokeless tobacco use about 32 years ago. His smokeless tobacco use included Chew. He reports that he does not drink alcohol or use drugs. family history includes Clotting disorder in his  brother; Coronary artery disease in his mother; Diabetes in his brother, mother, and sister; Hypertension in his brother. Allergies  Allergen Reactions  . Ace Inhibitors Cough  . Codeine Nausea And Vomiting  . Sulfa Antibiotics     hives   Current Outpatient Prescriptions on File Prior to Visit  Medication Sig Dispense Refill  . acetaminophen (TYLENOL) 325 MG tablet Take 650 mg by mouth every 6 (six) hours as needed (pain).    Marland Kitchen ALPRAZolam (XANAX) 1 MG tablet Take 1 mg by mouth at bedtime as needed for anxiety.     Marland Kitchen aspirin EC 81 MG tablet Take 1 tablet (81 mg total) by mouth daily. 90 tablet 11  . gabapentin (NEURONTIN) 400 MG capsule Take 400 mg by mouth 3  (three) times daily.    . Multiple Vitamins-Minerals (ZINC PO) Take by mouth.    Marland Kitchen NEXIUM 40 MG capsule Take 1 capsule (40 mg total) by mouth daily. 90 capsule 3  . NON FORMULARY TMLAE-all natural mens supplement    . polyethylene glycol powder (GLYCOLAX/MIRALAX) powder DISSOLVE 1 CAPFUL IN LIQUID EVERY DAY AS NEEDED FOR CONSTIPATION 527 g 2  . sucralfate (CARAFATE) 1 G tablet Take 1 g by mouth 4 (four) times daily as needed (acid reflux).     . tamsulosin (FLOMAX) 0.4 MG CAPS capsule Take 0.4 mg by mouth 2 (two) times daily.    . [DISCONTINUED] ferrous sulfate 325 (65 FE) MG tablet Take 325 mg by mouth daily with breakfast.      . [DISCONTINUED] losartan (COZAAR) 50 MG tablet Take 1 tablet (50 mg total) by mouth daily. 30 tablet 6   No current facility-administered medications on file prior to visit.     Review of Systems  Constitutional: Negative for unusual diaphoresis or night sweats HENT: Negative for ear swelling or discharge Eyes: Negative for worsening visual haziness  Respiratory: Negative for choking and stridor.   Gastrointestinal: Negative for distension or worsening eructation Genitourinary: Negative for retention or change in urine volume.  Musculoskeletal: Negative for other MSK pain or swelling Skin: Negative for color change and worsening wound Neurological: Negative for tremors and numbness other than noted  Psychiatric/Behavioral: Negative for decreased concentration or agitation other than above       Objective:   Physical Exam BP 122/74   Pulse 86   Temp 98.4 F (36.9 C) (Oral)   Resp 20   Wt 236 lb (107 kg)   SpO2 96%   BMI 32.92 kg/m  VS noted,  Constitutional: Pt appears in no apparent distress HENT: Head: NCAT.  Right Ear: External ear normal.  Left Ear: External ear normal.  Eyes: . Pupils are equal, round, and reactive to light. Conjunctivae and EOM are normal Bilat tm's with mild erythema.  Max sinus areas non tender.  Pharynx with mild erythema,  no exudate Neck: Normal range of motion. Neck supple.  Cardiovascular: Normal rate and regular rhythm.   Pulmonary/Chest: Effort normal and breath sounds without rales or wheezing.  Neurological: Pt is alert. Not confused , motor grossly intact Skin: Skin is warm. No rash, no LE edema Psychiatric: Pt behavior is normal. No agitation. 1+ nervous    Assessment & Plan:

## 2016-07-14 NOTE — Progress Notes (Signed)
Pre visit review using our clinic review tool, if applicable. No additional management support is needed unless otherwise documented below in the visit note. 

## 2016-07-15 NOTE — Assessment & Plan Note (Signed)
stable overall by history and exam, recent data reviewed with pt, and pt to continue medical treatment as before,  to f/u any worsening symptoms or concerns Lab Results  Component Value Date   WBC 9.2 01/13/2016   HGB 16.9 01/13/2016   HCT 50.0 01/13/2016   PLT 160.0 01/13/2016   GLUCOSE 96 01/13/2016   CHOL 149 01/13/2016   TRIG 115.0 01/13/2016   HDL 45.70 01/13/2016   LDLCALC 80 01/13/2016   ALT 13 01/13/2016   AST 16 01/13/2016   NA 142 01/13/2016   K 4.7 01/13/2016   CL 103 01/13/2016   CREATININE 1.04 01/13/2016   BUN 13 01/13/2016   CO2 32 01/13/2016   TSH 1.62 01/13/2016   PSA 8.50 (H) 06/03/2015   HGBA1C 5.6 01/11/2015

## 2016-07-15 NOTE — Assessment & Plan Note (Signed)
stable overall by history and exam, recent data reviewed with pt, and pt to continue medical treatment as before,  to f/u any worsening symptoms or concerns BP Readings from Last 3 Encounters:  07/14/16 122/74  01/13/16 116/78  11/22/15 (!) 152/88

## 2016-07-15 NOTE — Assessment & Plan Note (Signed)
Also improved with recent prednisone,  to f/u any worsening symptoms or concerns

## 2016-08-17 DIAGNOSIS — M1288 Other specific arthropathies, not elsewhere classified, other specified site: Secondary | ICD-10-CM | POA: Diagnosis not present

## 2016-08-17 DIAGNOSIS — M5442 Lumbago with sciatica, left side: Secondary | ICD-10-CM | POA: Diagnosis not present

## 2016-09-11 DIAGNOSIS — G500 Trigeminal neuralgia: Secondary | ICD-10-CM | POA: Diagnosis not present

## 2016-10-05 ENCOUNTER — Other Ambulatory Visit: Payer: Self-pay | Admitting: Internal Medicine

## 2016-10-18 DIAGNOSIS — R972 Elevated prostate specific antigen [PSA]: Secondary | ICD-10-CM | POA: Diagnosis not present

## 2016-10-25 DIAGNOSIS — R972 Elevated prostate specific antigen [PSA]: Secondary | ICD-10-CM | POA: Diagnosis not present

## 2016-10-25 DIAGNOSIS — E291 Testicular hypofunction: Secondary | ICD-10-CM | POA: Diagnosis not present

## 2016-11-01 ENCOUNTER — Other Ambulatory Visit: Payer: Self-pay | Admitting: Internal Medicine

## 2016-11-01 DIAGNOSIS — M4687 Other specified inflammatory spondylopathies, lumbosacral region: Secondary | ICD-10-CM | POA: Diagnosis not present

## 2016-11-01 DIAGNOSIS — M5442 Lumbago with sciatica, left side: Secondary | ICD-10-CM | POA: Diagnosis not present

## 2016-11-02 ENCOUNTER — Other Ambulatory Visit: Payer: Self-pay | Admitting: Orthopedic Surgery

## 2016-11-02 DIAGNOSIS — M47817 Spondylosis without myelopathy or radiculopathy, lumbosacral region: Secondary | ICD-10-CM

## 2016-11-07 ENCOUNTER — Other Ambulatory Visit: Payer: Medicare Other

## 2016-11-29 ENCOUNTER — Telehealth: Payer: Self-pay | Admitting: Gastroenterology

## 2016-11-29 ENCOUNTER — Ambulatory Visit: Payer: Medicare Other | Admitting: Internal Medicine

## 2016-11-29 ENCOUNTER — Ambulatory Visit: Payer: Medicare Other | Admitting: Gastroenterology

## 2016-11-29 ENCOUNTER — Emergency Department (HOSPITAL_COMMUNITY)
Admission: EM | Admit: 2016-11-29 | Discharge: 2016-11-29 | Disposition: A | Discharge status: Home / Self Care | Payer: Medicare HMO | Attending: Emergency Medicine | Admitting: Emergency Medicine

## 2016-11-29 ENCOUNTER — Encounter (HOSPITAL_COMMUNITY): Payer: Self-pay | Admitting: *Deleted

## 2016-11-29 DIAGNOSIS — Z87891 Personal history of nicotine dependence: Secondary | ICD-10-CM | POA: Diagnosis not present

## 2016-11-29 DIAGNOSIS — J0110 Acute frontal sinusitis, unspecified: Secondary | ICD-10-CM | POA: Diagnosis not present

## 2016-11-29 DIAGNOSIS — Z79899 Other long term (current) drug therapy: Secondary | ICD-10-CM | POA: Diagnosis not present

## 2016-11-29 DIAGNOSIS — I10 Essential (primary) hypertension: Secondary | ICD-10-CM | POA: Insufficient documentation

## 2016-11-29 DIAGNOSIS — Z7982 Long term (current) use of aspirin: Secondary | ICD-10-CM | POA: Insufficient documentation

## 2016-11-29 DIAGNOSIS — G500 Trigeminal neuralgia: Secondary | ICD-10-CM | POA: Diagnosis not present

## 2016-11-29 DIAGNOSIS — J0100 Acute maxillary sinusitis, unspecified: Secondary | ICD-10-CM | POA: Diagnosis not present

## 2016-11-29 DIAGNOSIS — J019 Acute sinusitis, unspecified: Secondary | ICD-10-CM

## 2016-11-29 DIAGNOSIS — R51 Headache: Secondary | ICD-10-CM | POA: Diagnosis present

## 2016-11-29 MED ORDER — CARBAMAZEPINE ER 200 MG PO TB12
200.0000 mg | ORAL_TABLET | Freq: Once | ORAL | Status: AC
  Administered 2016-11-29: 200 mg via ORAL
  Filled 2016-11-29: qty 1

## 2016-11-29 MED ORDER — OXYCODONE HCL 5 MG PO TABS
5.0000 mg | ORAL_TABLET | Freq: Once | ORAL | Status: AC
  Administered 2016-11-29: 5 mg via ORAL
  Filled 2016-11-29: qty 1

## 2016-11-29 MED ORDER — OXYMETAZOLINE HCL 0.05 % NA SOLN: 1.0000 | Freq: Two times a day (BID) | NASAL | 0 refills | Status: DC

## 2016-11-29 MED ORDER — ONDANSETRON 4 MG PO TBDP: 4.0000 mg | ORAL_TABLET | Freq: Three times a day (TID) | ORAL | 0 refills | Status: DC | PRN

## 2016-11-29 MED ORDER — SODIUM CHLORIDE 0.9 % IV BOLUS (SEPSIS)
1000.0000 mL | Freq: Once | INTRAVENOUS | Status: AC
  Administered 2016-11-29: 1000 mL via INTRAVENOUS

## 2016-11-29 MED ORDER — OXYCODONE HCL 5 MG PO TABS: 5.0000 mg | ORAL_TABLET | ORAL | 0 refills | Status: DC | PRN

## 2016-11-29 MED ORDER — DEXAMETHASONE SODIUM PHOSPHATE 10 MG/ML IJ SOLN
10.0000 mg | Freq: Once | INTRAMUSCULAR | Status: AC
  Administered 2016-11-29: 10 mg via INTRAVENOUS
  Filled 2016-11-29: qty 1

## 2016-11-29 MED ORDER — PSEUDOEPHEDRINE HCL 30 MG PO TABS: 30.0000 mg | ORAL_TABLET | ORAL | 0 refills | Status: DC | PRN

## 2016-11-29 MED ORDER — OXYCODONE HCL 5 MG PO TABS: 5.0000 mg | ORAL_TABLET | ORAL | Status: DC | PRN

## 2016-11-29 MED ORDER — ONDANSETRON HCL 4 MG/2ML IJ SOLN
4.0000 mg | Freq: Once | INTRAMUSCULAR | Status: AC
  Administered 2016-11-29: 4 mg via INTRAVENOUS
  Filled 2016-11-29: qty 2

## 2016-11-29 MED ORDER — FENTANYL CITRATE (PF) 100 MCG/2ML IJ SOLN
100.0000 ug | Freq: Once | INTRAMUSCULAR | Status: AC
  Administered 2016-11-29: 100 ug via INTRAVENOUS
  Filled 2016-11-29: qty 2

## 2016-11-29 MED ORDER — CARBAMAZEPINE ER 100 MG PO CP12: 100.0000 mg | ORAL_CAPSULE | Freq: Two times a day (BID) | ORAL | 0 refills | Status: DC

## 2016-11-29 NOTE — ED Provider Notes (Signed)
[name] DEPT Provider Note   CSN: [id number] Arrival date & time: 11/29/16  1143     History   Chief Complaint Chief Complaint  Patient presents with  . Pain    HPI [name] is a 66 y.o. male.  Patients with history of right sided trigeminal neuralgia, status post rhizotomy by Dr. Vertell Limber, on gabapentin -- presents with gradually worsening shooting pains in the right side of his face interfering with chewing and swallowing over the past 4 days. Patient has been in touch with his neurosurgeon who increased gabapentin to 1200 mg daily. He has been on Tegretol in the past which has helped, but is not currently. No other treatments. Patient called his doctor today who recommended being seen in emergency department for dehydration and poor oral intake. Also pain control. Patient also reports having sinus pressure and frontal headache for several days. He has had nasal discharge and feels like he has a sinus infection. No fevers, sore throat, vomiting, or diarrhea.       Past Medical History:  Diagnosis Date  . Allergic rhinitis   . Anemia, pernicious   . Anxiety and depression    sees Dr. [name]  . Arthritis   . Catheter-associated urinary tract infection ([place])   . Diverticulosis of colon   . Elevated PSA    and hypogonadism, sees urologist  . Facial asymmetry   . GERD (gastroesophageal reflux disease)    hiatal hernia  . HTN (hypertension)   . Hx of blood transfusion reaction   . Hyperlipidemia   . Hyperparathyroidism ([place])   . Hyperplastic colon polyp   . Hypertension    acei causes cough  . IBS (irritable bowel syndrome)   . Nephrolithiasis   . Obstructive chronic bronchitis without exacerbation, followed by Dr. [name] 03/04/2009   ONO RA  Normal 07/28/11 6 min walk >570m no desat PFTs 08/08/11: DLCO 70% TLC 80%  FeV1 80%   Fef 25 75 56% with significant improvement after BD   . Peritonitis ([place])    after surgery in 1999  . PONV (postoperative nausea and  vomiting)   . Rectal fissure   . Trigeminal neuralgia    has facial asymetry  . Ventral hernia     Patient Active Problem List   Diagnosis Date Noted  . Preventative health care 01/13/2016  . Absolute anemia 10/15/2015  . Cervical spondylosis, treated by Dr. Gladstone Lighter, [facility] 06/12/2013  . Ventral hernia 03/04/2013  . Anxiety and depression, followed by Dr. [name] in Psych 11/19/2012  . Hiatal hernia, followed by Dr. Hassell Done ([place]) and Dr. Olevia Perches (GI) 11/19/2012  . Other testicular hypofunction, followed by Dr. Karsten Ro in Urology 10/15/2012  . Trigeminal neuralgia, s/p NSU, followed by Dr. Vertell Limber 10/15/2012  . BPH (benign prostatic hyperplasia), followed by Dr. Karsten Ro 07/26/2011  . Rosacea 09/02/2010  . COLONIC POLYPS, HYPERPLASTIC, HX OF 12/13/2009  . Obstructive chronic bronchitis without exacerbation, followed by Dr. [name] 03/04/2009  . Allergic rhinitis 04/03/2008  . Essential hypertension 05/08/2007  . GERD followed by Dr. Olevia Perches 05/08/2007    Past Surgical History:  Procedure Laterality Date  . ABDOMINAL SURGERY     Multiple  . BRAIN SURGERY  March 2012   Trigeminal nerve  . CHOLECYSTECTOMY  1981  . HAND SURGERY  1973   Left  . [place]   Left  . LITHOTRIPSY     Right  . NISSEN FUNDOPLICATION  [id number]  complicated by gastric perforation, peritonitis, ventral nernia   . RHIZOTOMY Right 11/17/2014   Procedure: RHIZOTOMY TO RIGHT V2,V3;  Surgeon: Erline Levine, MD;  Location: [facility];  Service: Neurosurgery;  Laterality: Right;  right  . RHIZOTOMY Right 12/22/2014   Procedure: Right V2 and V3 trigeminal rhysolysis;  Surgeon: Erline Levine, MD;  Location: [facility];  Service: Neurosurgery;  Laterality: Right;  Right V2 and V3 trigeminal rhysolysis  . UPPER GASTROINTESTINAL ENDOSCOPY    . VENTRAL HERNIA REPAIR         Home Medications    Prior to Admission medications   Medication Sig Start Date End Date Taking? Authorizing  Provider  ALPRAZolam Duanne Moron) 1 MG tablet Take 1 mg by mouth at bedtime as needed for anxiety.    Yes Historical Provider, MD  aspirin EC 81 MG tablet Take 1 tablet (81 mg total) by mouth daily. 01/13/16  Yes [name], MD  ferrous sulfate 325 (65 FE) MG tablet Take 325 mg by mouth daily with breakfast.   Yes Historical Provider, MD  gabapentin (NEURONTIN) 400 MG capsule Take 800 mg by mouth 3 (three) times daily.    Yes Historical Provider, MD  losartan-hydrochlorothiazide (HYZAAR) 50-12.5 MG tablet TAKE 1 TABLET BY MOUTH EVERY DAY 11/01/16  Yes [name], MD  NEXIUM 40 MG capsule Take 1 capsule (40 mg total) by mouth daily. 01/17/16  Yes [name], MD  NON FORMULARY TMLAE-all natural mens supplement   Yes Historical Provider, MD  polyethylene glycol powder (GLYCOLAX/MIRALAX) powder DISSOLVE 1 CAPFUL IN LIQUID EVERY DAY AS NEEDED FOR CONSTIPATION 02/18/14  Yes Lucretia Kern, DO  sucralfate (CARAFATE) 1 G tablet Take 1 g by mouth 4 (four) times daily as needed (acid reflux).    Yes Historical Provider, MD  acetaminophen (TYLENOL) 325 MG tablet Take 650 mg by mouth every 6 (six) hours as needed (pain).    Historical Provider, MD  Multiple Vitamins-Minerals (ZINC PO) Take by mouth.    Historical Provider, MD  predniSONE (STERAPRED UNI-PAK 21 TAB) 10 MG (21) TBPK tablet Take 10 mg by mouth daily.    Historical Provider, MD    Family History Family History  Problem Relation Age of Onset  . Diabetes Mother   . Coronary artery disease Mother     CABG  . Diabetes Sister   . Diabetes Brother   . Hypertension Brother   . Clotting disorder Brother   . Colon cancer Neg Hx   . Esophageal cancer Neg Hx   . Stomach cancer Neg Hx     Social History Social History  Substance Use Topics  . Smoking status: Former Smoker    Packs/day: 1.00    Years: 20.00    Types: Cigarettes    Quit date: 11/06/1996  . Smokeless tobacco: Former Systems developer    Types: Chew    Quit date: 11/07/1983  . Alcohol use No      Allergies   Ace inhibitors; Codeine; and Sulfa antibiotics   Review of Systems Review of Systems  Constitutional: Negative for fever.  HENT: Positive for congestion, sinus pain and sinus pressure. Negative for ear pain, facial swelling, rhinorrhea and sore throat.   Eyes: Negative for redness.  Respiratory: Negative for cough.   Cardiovascular: Negative for chest pain.  Gastrointestinal: Negative for abdominal pain, diarrhea, nausea and vomiting.  Genitourinary: Negative for dysuria.  Musculoskeletal: Negative for myalgias.  Skin: Negative for rash.  Neurological: Positive for headaches.  Physical Exam Updated Vital Signs BP 125/91 (BP Location: Left Arm)   Pulse 93   Temp 98.9 F (37.2 C) (Oral)   Resp 18   SpO2 98%   Physical Exam  Constitutional: He appears well-developed and well-nourished.  HENT:  Head: Normocephalic and atraumatic.  Right Ear: Tympanic membrane, external ear and ear canal normal.  Left Ear: Tympanic membrane, external ear and ear canal normal.  Nose: Mucosal edema present. Right sinus exhibits maxillary sinus tenderness and frontal sinus tenderness. Left sinus exhibits maxillary sinus tenderness and frontal sinus tenderness.  Tenderness over the right cheek and forehead  Eyes: Conjunctivae are normal. Right eye exhibits no discharge. Left eye exhibits no discharge.  Neck: Normal range of motion. Neck supple.  Cardiovascular: Normal rate, regular rhythm and normal heart sounds.   Pulmonary/Chest: Effort normal and breath sounds normal. No respiratory distress.  Abdominal: Soft. There is no tenderness.  Neurological: He is alert.  Skin: Skin is warm and dry.  Psychiatric: He has a normal mood and affect.  Nursing note and vitals reviewed.    ED Treatments / Results   Procedures Procedures (including critical care time)  Medications Ordered in ED Medications  sodium chloride 0.9 % bolus 1,000 mL (1,000 mLs Intravenous New Bag/Given  11/29/16 1519)  dexamethasone (DECADRON) injection 10 mg (10 mg Intravenous Given 11/29/16 1523)  fentaNYL (SUBLIMAZE) injection 100 mcg (100 mcg Intravenous Given 11/29/16 1521)  ondansetron (ZOFRAN) injection 4 mg (4 mg Intravenous Given 11/29/16 1519)     Initial Impression / Assessment and Plan / ED Course  I have reviewed the triage vital signs and the nursing notes.  Pertinent labs & imaging results that were available during my care of the patient were reviewed by me and considered in my medical decision making (see chart for details).    Patient seen and examined.  Vital signs reviewed and are as follows: BP 109/68 (BP Location: Right Arm)   Pulse 66   Temp 98.7 F (37.1 C) (Oral)   Resp 15   SpO2 95%   I spoke with Dr. Vertell Limber by telephone. Reccs fluids, decadron, pain medication, restart tegretol. If admit needed -- admit to medicine for symptoms control.   Discussed with patient and is willing to proceed.   3:50 PM Pt improved. Oral oxycodone and tegretol ordered.   Handoff to Dr. Kathrynn Humble at shift change.   If tolerates orals well -- will d/c to home with pain medication and tegretol. He is to f/u with Dr. Vertell Limber.   Also symptomatic treatment for sinus infection.   Final Clinical Impressions(s) / ED Diagnoses   Final diagnoses:  Trigeminal neuralgia of right side of face  Acute non-recurrent sinusitis, unspecified location   Pending completion of treatment.   New Prescriptions New Prescriptions   CARBAMAZEPINE (CARBATROL) 100 MG 12 HR CAPSULE    Take 1 capsule (100 mg total) by mouth 2 (two) times daily.   ONDANSETRON (ZOFRAN ODT) 4 MG DISINTEGRATING TABLET    Take 1 tablet (4 mg total) by mouth every 8 (eight) hours as needed for nausea or vomiting.   OXYCODONE (OXY IR/ROXICODONE) 5 MG IMMEDIATE RELEASE TABLET    Take 1 tablet (5 mg total) by mouth every 4 (four) hours as needed for severe pain.   OXYMETAZOLINE (AFRIN NASAL SPRAY) 0.05 % NASAL SPRAY    Place 1  spray into both nostrils 2 (two) times daily.   PSEUDOEPHEDRINE (SUDAFED) 30 MG TABLET    Take 1 tablet (30 mg  total) by mouth every 4 (four) hours as needed for congestion.     Carlisle Cater, PA-C 11/29/16 [place], MD 12/05/16 [phone]

## 2016-11-29 NOTE — Discharge Instructions (Signed)
Please read and follow all provided instructions.  Your diagnoses today include:  1. Trigeminal neuralgia of right side of face   2. Acute non-recurrent sinusitis, unspecified location    Tests performed today include:  Vital signs. See below for your results today.   Medications prescribed:   Oxycodone - narcotic pain medication  DO NOT drive or perform any activities that require you to be awake and alert because this medicine can make you drowsy.    Zofran (ondansetron) - for nausea and vomiting   Carbamazepine (Tegretol) - medication for trigeminal neuralgia   Oxymetazoline - nasal spray for congestion. Do not use for more than 3 days because this medicine can cause rebound congestion.    Pseudoephedrine - decongestant medication to help with nasal congestion  Take any prescribed medications only as directed.  Home care instructions:  Follow any educational materials contained in this packet.  BE VERY CAREFUL not to take multiple medicines containing Tylenol (also called acetaminophen). Doing so can lead to an overdose which can damage your liver and cause liver failure and possibly death.   Follow-up instructions: Please follow-up with Dr. Vertell Limber in the next few days.   Return instructions:   Please return to the Emergency Department if you experience worsening symptoms.   Please return if you have any other emergent concerns.  Additional Information:  Your vital signs today were: BP 109/68 (BP Location: Right Arm)    Pulse 66    Temp 98.7 F (37.1 C) (Oral)    Resp 15    SpO2 95%  If your blood pressure (BP) was elevated above 135/85 this visit, please have this repeated by your doctor within one month. --------------

## 2016-11-29 NOTE — Telephone Encounter (Signed)
No Charge 

## 2016-11-29 NOTE — ED Triage Notes (Signed)
Pt reports hx of trigeminal neuralgia. Having increase in pain to right side of face since Saturday. Unable to eat/drink due to pain, therefore unable to tolerate taking meds. Pt feels like he is dehydrated. No acute distress is noted at this time.

## 2016-12-11 ENCOUNTER — Encounter: Payer: Self-pay | Admitting: Internal Medicine

## 2016-12-11 ENCOUNTER — Ambulatory Visit (INDEPENDENT_AMBULATORY_CARE_PROVIDER_SITE_OTHER): Payer: Medicare HMO | Admitting: Internal Medicine

## 2016-12-11 DIAGNOSIS — J3089 Other allergic rhinitis: Secondary | ICD-10-CM

## 2016-12-11 MED ORDER — PREDNISONE 20 MG PO TABS: 40.0000 mg | ORAL_TABLET | Freq: Every day | ORAL | 0 refills | Status: DC

## 2016-12-11 NOTE — Progress Notes (Signed)
   Subjective:    Patient ID: [name], male    DOB: 03-01-1951, 66 y.o.   MRN: [id number]  HPI The patient is a 66 YO man coming in for 2 weeks of cough and congestion. He is taking mucinex and afrin per the advice of emergency room. This has caused his trigeminal neuralgia to flare up and they had to adjust his dose of gabapentin. He is having some sinus pressure and drainage, cough is improving over the last 2 weeks. Denies fevers or chills. Overall better than at its worst but feels he has plateaued.   Review of Systems  Constitutional: Positive for activity change. Negative for appetite change, chills, fatigue, fever and unexpected weight change.  HENT: Positive for congestion, rhinorrhea, sinus pain and sinus pressure. Negative for ear discharge, ear pain, nosebleeds, postnasal drip, sore throat and trouble swallowing.   Eyes: Negative.   Respiratory: Negative for cough, chest tightness, shortness of breath and wheezing.   Cardiovascular: Negative for chest pain, palpitations and leg swelling.  Gastrointestinal: Negative.   Musculoskeletal: Negative.   Skin: Negative.       Objective:   Physical Exam  Constitutional: He is oriented to person, place, and time. He appears well-developed and well-nourished.  HENT:  Head: Normocephalic and atraumatic.  Right Ear: External ear normal.  Left Ear: External ear normal.  Oropharynx with redness and drainage, sinus pressure  Eyes: EOM are normal.  Neck: Normal range of motion.  Cardiovascular: Normal rate and regular rhythm.   Pulmonary/Chest: Effort normal and breath sounds normal.  Abdominal: Soft.  Lymphadenopathy:    He has no cervical adenopathy.  Neurological: He is alert and oriented to person, place, and time.  Skin:  Rash on the face appears to be rosacea   Vitals:   12/11/16 1302  BP: (!) 144/90  Pulse: 76  Temp: 97.7 F (36.5 C)  TempSrc: Oral  SpO2: 98%  Weight: 242 lb 4 oz (109.9 kg)  Height: 5\' 11"   (1.803 m)      Assessment & Plan:

## 2016-12-11 NOTE — Patient Instructions (Signed)
We have sent in the prednisone to the pharmacy. Take 2 pills daily for 5 days to help dry up the sinuses and the nerve pain.  Also start taking zyrtec (cetirizine) 1 pill daily to help dry up the sinuses.

## 2016-12-11 NOTE — Assessment & Plan Note (Signed)
Rx for prednisone for ongoing sinus pressure. Antibiotic not indicated. Advised to start taking zyrtec for relief as well and stop mucinex and afrin.

## 2016-12-11 NOTE — Progress Notes (Signed)
Pre visit review using our clinic review tool, if applicable. No additional management support is needed unless otherwise documented below in the visit note. 

## 2017-01-03 DIAGNOSIS — G500 Trigeminal neuralgia: Secondary | ICD-10-CM | POA: Diagnosis not present

## 2017-01-03 DIAGNOSIS — R030 Elevated blood-pressure reading, without diagnosis of hypertension: Secondary | ICD-10-CM | POA: Diagnosis not present

## 2017-01-03 DIAGNOSIS — Z6833 Body mass index (BMI) 33.0-33.9, adult: Secondary | ICD-10-CM | POA: Diagnosis not present

## 2017-01-03 DIAGNOSIS — J320 Chronic maxillary sinusitis: Secondary | ICD-10-CM | POA: Diagnosis not present

## 2017-01-09 ENCOUNTER — Encounter: Payer: Self-pay | Admitting: Gastroenterology

## 2017-01-09 ENCOUNTER — Ambulatory Visit (INDEPENDENT_AMBULATORY_CARE_PROVIDER_SITE_OTHER): Payer: Medicare HMO | Admitting: Gastroenterology

## 2017-01-09 VITALS — BP 136/86 | HR 72 | Ht 71.0 in | Wt 243.8 lb

## 2017-01-09 DIAGNOSIS — Z86010 Personal history of colonic polyps: Secondary | ICD-10-CM

## 2017-01-09 DIAGNOSIS — D500 Iron deficiency anemia secondary to blood loss (chronic): Secondary | ICD-10-CM | POA: Diagnosis not present

## 2017-01-09 DIAGNOSIS — K219 Gastro-esophageal reflux disease without esophagitis: Secondary | ICD-10-CM | POA: Diagnosis not present

## 2017-01-09 DIAGNOSIS — K449 Diaphragmatic hernia without obstruction or gangrene: Secondary | ICD-10-CM | POA: Diagnosis not present

## 2017-01-09 MED ORDER — NA SULFATE-K SULFATE-MG SULF 17.5-3.13-1.6 GM/177ML PO SOLN: 1.0000 | Freq: Once | ORAL | 0 refills | Status: AC

## 2017-01-09 NOTE — Progress Notes (Signed)
[name]    [id number]    10-23-1951  Primary Care Physician:James [name], MD  Referring Physician: [name], MD [address], [place] [address]  Chief complaint:  H/o colon polyps  HPI: 66 year old male with history of chronic GERD status post Nissen fundoplication, abdominal hernia repair, and history of colon polyps here for follow-up visit.  Last colonoscopy in Jan 2017 with removal of multiple polyps, including 2 large sessile 2.5 cm polyp in the ascending colon that was hot snared piecemeal. EGD Jan 2017 showed large paraesophageal hernia with Lysbeth Galas erosion that was actively bleeding treated with cautery, likely etiology of chronic GI blood loss. He has no specific GI complaints. Denies any nausea, vomiting, abdominal pain, melena or bright red blood per rectum He was recently evaluated for trigeminal neurology in the ER.   Outpatient Encounter Prescriptions as of 01/09/2017  Medication Sig  . acetaminophen (TYLENOL) 325 MG tablet Take 650 mg by mouth every 6 (six) hours as needed (pain).  Marland Kitchen ALPRAZolam (XANAX) 1 MG tablet Take 1 mg by mouth at bedtime as needed for anxiety.   Marland Kitchen aspirin EC 81 MG tablet Take 1 tablet (81 mg total) by mouth daily.  . carbamazepine (CARBATROL) 100 MG 12 hr capsule Take 1 capsule (100 mg total) by mouth 2 (two) times daily.  Marland Kitchen gabapentin (NEURONTIN) 400 MG capsule Take 800 mg by mouth 3 (three) times daily.   Marland Kitchen losartan-hydrochlorothiazide (HYZAAR) 50-12.5 MG tablet TAKE 1 TABLET BY MOUTH EVERY DAY  . NEXIUM 40 MG capsule Take 1 capsule (40 mg total) by mouth daily.  . NON FORMULARY TMLAE-all natural mens supplement  . polyethylene glycol powder (GLYCOLAX/MIRALAX) powder DISSOLVE 1 CAPFUL IN LIQUID EVERY DAY AS NEEDED FOR CONSTIPATION  . [DISCONTINUED] ferrous sulfate 325 (65 FE) MG tablet Take 325 mg by mouth daily with breakfast.  . [DISCONTINUED] ondansetron (ZOFRAN ODT) 4 MG disintegrating tablet Take 1  tablet (4 mg total) by mouth every 8 (eight) hours as needed for nausea or vomiting. (Patient not taking: Reported on 12/11/2016)  . [DISCONTINUED] oxyCODONE (OXY IR/ROXICODONE) 5 MG immediate release tablet Take 1 tablet (5 mg total) by mouth every 4 (four) hours as needed for severe pain. (Patient not taking: Reported on 12/11/2016)  . [DISCONTINUED] oxymetazoline (AFRIN NASAL SPRAY) 0.05 % nasal spray Place 1 spray into both nostrils 2 (two) times daily. (Patient not taking: Reported on 01/09/2017)  . [DISCONTINUED] predniSONE (DELTASONE) 20 MG tablet Take 2 tablets (40 mg total) by mouth daily with breakfast. (Patient not taking: Reported on 01/09/2017)  . [DISCONTINUED] pseudoephedrine (SUDAFED) 30 MG tablet Take 1 tablet (30 mg total) by mouth every 4 (four) hours as needed for congestion. (Patient not taking: Reported on 01/09/2017)  . [DISCONTINUED] sucralfate (CARAFATE) 1 G tablet Take 1 g by mouth 4 (four) times daily as needed (acid reflux).    No facility-administered encounter medications on file as of 01/09/2017.     Allergies as of 01/09/2017 - Review Complete 01/09/2017  Allergen Reaction Noted  . Ace inhibitors Cough 04/02/2008  . Codeine Nausea And Vomiting 11/10/2014  . Sulfa antibiotics  12/17/2014    Past Medical History:  Diagnosis Date  . Allergic rhinitis   . Anemia, pernicious   . Anxiety and depression    sees Dr. [name]  . Arthritis   . Catheter-associated urinary tract infection ([place])   . Diverticulosis of colon   . Elevated  PSA    and hypogonadism, sees urologist  . Facial asymmetry   . GERD (gastroesophageal reflux disease)    hiatal hernia  . HTN (hypertension)   . Hx of blood transfusion reaction   . Hyperlipidemia   . Hyperparathyroidism ([place])   . Hyperplastic colon polyp   . Hypertension    acei causes cough  . IBS (irritable bowel syndrome)   . Nephrolithiasis   . Obstructive chronic bronchitis without exacerbation, followed by Dr. [name] 03/04/2009   ONO  RA  Normal 07/28/11 6 min walk >554m no desat PFTs 08/08/11: DLCO 70% TLC 80%  FeV1 80%   Fef 25 75 56% with significant improvement after BD   . Peritonitis ([place])    after surgery in 1999  . PONV (postoperative nausea and vomiting)   . Rectal fissure   . Trigeminal neuralgia    has facial asymetry  . Ventral hernia     Past Surgical History:  Procedure Laterality Date  . ABDOMINAL SURGERY     Multiple  . BRAIN SURGERY  March 2012   Trigeminal nerve  . CHOLECYSTECTOMY  1981  . HAND SURGERY  1973   Left  . [place]   Left  . LITHOTRIPSY     Right  . NISSEN FUNDOPLICATION  [id number]    complicated by gastric perforation, peritonitis, ventral nernia   . RHIZOTOMY Right 11/17/2014   Procedure: RHIZOTOMY TO RIGHT V2,V3;  Surgeon: Erline Levine, MD;  Location: [facility];  Service: Neurosurgery;  Laterality: Right;  right  . RHIZOTOMY Right 12/22/2014   Procedure: Right V2 and V3 trigeminal rhysolysis;  Surgeon: Erline Levine, MD;  Location: [facility];  Service: Neurosurgery;  Laterality: Right;  Right V2 and V3 trigeminal rhysolysis  . UPPER GASTROINTESTINAL ENDOSCOPY    . VENTRAL HERNIA REPAIR      Family History  Problem Relation Age of Onset  . Diabetes Mother   . Coronary artery disease Mother     CABG  . Diabetes Sister   . Diabetes Brother   . Hypertension Brother   . Clotting disorder Brother   . Colon cancer Neg Hx   . Esophageal cancer Neg Hx   . Stomach cancer Neg Hx     Social History   Social History  . Marital status: Married    Spouse name: N/A  . Number of children: 2  . Years of education: N/A   Occupational History  . Disabled - psych    Social History Main Topics  . Smoking status: Former Smoker    Packs/day: 1.00    Years: 20.00    Types: Cigarettes    Quit date: 11/06/1996  . Smokeless tobacco: Former Systems developer    Types: Chew    Quit date: 11/07/1983  . Alcohol use No  . Drug use: No  . Sexual activity: Not on file    Other Topics Concern  . Not on file   Social History Narrative   Daily Caffeine Use:  Yes            Review of systems: Review of Systems  Constitutional: Negative for fever and chills.  HENT: Negative.   Eyes: Negative for blurred vision.  Respiratory: Negative for cough, shortness of breath and wheezing.   Cardiovascular: Negative for chest pain and palpitations.  Gastrointestinal: as per HPI Genitourinary: Negative for dysuria, urgency, frequency and hematuria.  Musculoskeletal: Negative for myalgias, back pain and joint pain.  Skin: Negative for itching and rash.  Neurological: Negative for dizziness, tremors, focal weakness, seizures and loss of consciousness.  Endo/Heme/Allergies: Positive for seasonal allergies.  Psychiatric/Behavioral: Negative for depression, suicidal ideas and hallucinations.  All other systems reviewed and are negative.   Physical Exam: Vitals:   01/09/17 0938  BP: 136/86  Pulse: 72   Body mass index is 34 kg/m. Gen:      No acute distress HEENT:  EOMI, sclera anicteric Neck:     No masses; no thyromegaly Lungs:    Clear to auscultation bilaterally; normal respiratory effort CV:         Regular rate and rhythm; no murmurs Abd:      + bowel sounds; soft, non-tender; no palpable masses, no distension Ext:    No edema; adequate peripheral perfusion Skin:      Warm and dry; no rash Neuro: alert and oriented x 3 Psych: normal mood and affect  Data Reviewed:  Reviewed labs, radiology imaging, old records and pertinent past GI work up   Assessment and Plan/Recommendations:  66 year old male with history of chronic GERD, paraesophageal hernia, abdominal hernia repair complicated with mesh infection and removal of that, multiple colon adenoma here for follow-up visit  Large tubulovillous adenoma in the ascending colon~2.5-3 cm in size removed piecemeal with hot snare He is due for surveillance colonoscopy to evaluate the site of  polypectomy and for any additional polyps The risks and benefits as well as alternatives of endoscopic procedure(s) have been discussed and reviewed. All questions answered. The patient agrees to proceed.  GERD symptoms stable on Nexium daily Continue antireflux measures Follow-up CBC, iron studies, B12 and folate  15 minutes was spent face-to-face with the patient. Greater than 50% of the time used for counseling as well as treatment plan and follow-up. He had multiple questions which were answered to his satisfaction  K. Denzil Magnuson , MD [phone] Mon-Fri 8a-5p [phone] after 5p, weekends, holidays  CC: [name], MD

## 2017-01-09 NOTE — Patient Instructions (Addendum)
You have been scheduled for a colonoscopy. Please follow written instructions given to you at your visit today.  Please pick up your prep supplies at the pharmacy within the next 1-3 days. If you use inhalers (even only as needed), please bring them with you on the day of your procedure. Your physician has requested that you go to www.startemmi.com and enter the access code given to you at your visit today. This web site gives a general overview about your procedure. However, you should still follow specific instructions given to you by our office regarding your preparation for the procedure.  We have put in your lab orders in our system, make sure the lab is aware to draw these also when you come back to have your labs drawn

## 2017-01-18 ENCOUNTER — Encounter: Payer: Self-pay | Admitting: Internal Medicine

## 2017-01-18 ENCOUNTER — Other Ambulatory Visit (INDEPENDENT_AMBULATORY_CARE_PROVIDER_SITE_OTHER): Payer: Medicare HMO

## 2017-01-18 ENCOUNTER — Ambulatory Visit (INDEPENDENT_AMBULATORY_CARE_PROVIDER_SITE_OTHER): Payer: Medicare HMO | Admitting: Internal Medicine

## 2017-01-18 ENCOUNTER — Other Ambulatory Visit: Payer: Self-pay | Admitting: Internal Medicine

## 2017-01-18 VITALS — BP 144/88 | HR 64 | Temp 97.6°F | Ht 71.0 in | Wt 241.0 lb

## 2017-01-18 DIAGNOSIS — Z86010 Personal history of colonic polyps: Secondary | ICD-10-CM

## 2017-01-18 DIAGNOSIS — Z0000 Encounter for general adult medical examination without abnormal findings: Secondary | ICD-10-CM

## 2017-01-18 DIAGNOSIS — Z0001 Encounter for general adult medical examination with abnormal findings: Secondary | ICD-10-CM

## 2017-01-18 DIAGNOSIS — Z23 Encounter for immunization: Secondary | ICD-10-CM

## 2017-01-18 DIAGNOSIS — R739 Hyperglycemia, unspecified: Secondary | ICD-10-CM | POA: Insufficient documentation

## 2017-01-18 DIAGNOSIS — L719 Rosacea, unspecified: Secondary | ICD-10-CM

## 2017-01-18 DIAGNOSIS — I10 Essential (primary) hypertension: Secondary | ICD-10-CM | POA: Diagnosis not present

## 2017-01-18 LAB — CBC WITH DIFFERENTIAL/PLATELET
BASOS ABS: 0.1 10*3/uL (ref 0.0–0.1)
Basophils Relative: 0.7 % (ref 0.0–3.0)
EOS ABS: 0.1 10*3/uL (ref 0.0–0.7)
Eosinophils Relative: 1.1 % (ref 0.0–5.0)
HCT: 50.3 % (ref 39.0–52.0)
HEMOGLOBIN: 17.5 g/dL — AB (ref 13.0–17.0)
LYMPHS ABS: 2.2 10*3/uL (ref 0.7–4.0)
Lymphocytes Relative: 29.5 % (ref 12.0–46.0)
MCHC: 34.7 g/dL (ref 30.0–36.0)
MCV: 88.9 fl (ref 78.0–100.0)
MONO ABS: 0.5 10*3/uL (ref 0.1–1.0)
Monocytes Relative: 6.9 % (ref 3.0–12.0)
NEUTROS PCT: 61.8 % (ref 43.0–77.0)
Neutro Abs: 4.7 10*3/uL (ref 1.4–7.7)
Platelets: 150 10*3/uL (ref 150.0–400.0)
RBC: 5.66 Mil/uL (ref 4.22–5.81)
RDW: 13.7 % (ref 11.5–15.5)
WBC: 7.6 10*3/uL (ref 4.0–10.5)

## 2017-01-18 LAB — BASIC METABOLIC PANEL
BUN: 11 mg/dL (ref 6–23)
CALCIUM: 9.8 mg/dL (ref 8.4–10.5)
CO2: 29 mEq/L (ref 19–32)
Chloride: 101 mEq/L (ref 96–112)
Creatinine, Ser: 0.91 mg/dL (ref 0.40–1.50)
GFR: 88.98 mL/min (ref >60.00)
GLUCOSE: 80 mg/dL (ref 70–99)
Potassium: 4.2 mEq/L (ref 3.5–5.1)
SODIUM: 142 meq/L (ref 135–145)

## 2017-01-18 LAB — LIPID PANEL
CHOL/HDL RATIO: 4
CHOLESTEROL: 172 mg/dL (ref 0–200)
HDL: 48.6 mg/dL (ref >39.00)
LDL CALC: 101 mg/dL — AB (ref 0–99)
NonHDL: 123.22
Triglycerides: 112 mg/dL (ref 0.0–149.0)
VLDL: 22.4 mg/dL (ref 0.0–40.0)

## 2017-01-18 LAB — HEMOGLOBIN A1C: Hgb A1c MFr Bld: 5.5 % (ref 4.6–6.5)

## 2017-01-18 LAB — URINALYSIS, ROUTINE W REFLEX MICROSCOPIC
Bilirubin Urine: NEGATIVE
HGB URINE DIPSTICK: NEGATIVE
KETONES UR: NEGATIVE
Leukocytes, UA: NEGATIVE
NITRITE: NEGATIVE
RBC / HPF: NONE SEEN (ref 0–?)
Specific Gravity, Urine: <=1.005 — AB (ref 1.000–1.030)
TOTAL PROTEIN, URINE-UPE24: NEGATIVE
URINE GLUCOSE: NEGATIVE
UROBILINOGEN UA: 0.2 (ref 0.0–1.0)
WBC UA: NONE SEEN (ref 0–?)
pH: 7 (ref 5.0–8.0)

## 2017-01-18 LAB — HEPATIC FUNCTION PANEL
ALK PHOS: 69 U/L (ref 39–117)
ALT: 12 U/L (ref 0–53)
AST: 15 U/L (ref 0–37)
Albumin: 4.3 g/dL (ref 3.5–5.2)
BILIRUBIN TOTAL: 0.6 mg/dL (ref 0.2–1.2)
Bilirubin, Direct: 0.1 mg/dL (ref 0.0–0.3)
Total Protein: 7.2 g/dL (ref 6.0–8.3)

## 2017-01-18 LAB — FERRITIN: FERRITIN: 56.2 ng/mL (ref 22.0–322.0)

## 2017-01-18 LAB — TSH: TSH: 1.94 u[IU]/mL (ref 0.35–4.50)

## 2017-01-18 LAB — PSA: PSA: 9.1 ng/mL — ABNORMAL HIGH (ref 0.10–4.00)

## 2017-01-18 LAB — IBC PANEL
IRON: 133 ug/dL (ref 42–165)
SATURATION RATIOS: 42.2 % (ref 20.0–50.0)
TRANSFERRIN: 225 mg/dL (ref 212.0–360.0)

## 2017-01-18 LAB — FOLATE: Folate: 10.9 ng/mL (ref >5.9)

## 2017-01-18 LAB — VITAMIN B12: Vitamin B-12: 209 pg/mL — ABNORMAL LOW (ref 211–911)

## 2017-01-18 MED ORDER — AZELAIC ACID 20 % EX CREA: TOPICAL_CREAM | Freq: Two times a day (BID) | CUTANEOUS | 2 refills | Status: DC

## 2017-01-18 NOTE — Assessment & Plan Note (Signed)
?   Overall control, but o/w stable overall by history and exam, recent data reviewed with pt, and pt to continue medical treatment as before,  to f/u any worsening symptoms or concerns BP Readings from Last 3 Encounters:  01/18/17 (!) 144/88  01/09/17 136/86  12/11/16 (!) 144/90

## 2017-01-18 NOTE — Assessment & Plan Note (Signed)
stable overall by history and exam, recent data reviewed with pt, and pt to continue medical treatment as before,  to f/u any worsening symptoms or concerns Lab Results  Component Value Date   HGBA1C 5.6 01/11/2015

## 2017-01-18 NOTE — Patient Instructions (Addendum)
You had the Tdap Tetanus shot today  Please take all new medication as prescribed  - the cream for the rosacea  Please continue all other medications as before, and refills have been done if requested.  Please have the pharmacy call with any other refills you may need.  Please continue your efforts at being more active, low cholesterol diet, and weight control.  You are otherwise up to date with prevention measures today.  Please keep your appointments with your specialists as you may have planned, including the colonoscopy  Please go to the LAB in the Basement (turn left off the elevator) for the tests to be done today You will be contacted by phone if any changes need to be made immediately.  Otherwise, you will receive a letter about your results with an explanation, but please check with MyChart first.  Please remember to sign up for MyChart if you have not done so, as this will be important to you in the future with finding out test results, communicating by private email, and scheduling acute appointments online when needed.  Please return in 1 year for your yearly visit, or sooner if needed, with Lab testing done 3-5 days before

## 2017-01-18 NOTE — Assessment & Plan Note (Signed)

## 2017-01-18 NOTE — Assessment & Plan Note (Addendum)
Mod to severe, for change topical tx to azeleic acid cream bid prn, also recommended further derm f/u for this,  to f/u any worsening symptoms or concerns  In addition to the time spent performing CPE, I spent an additional 15 minutes face to face,in which greater than 50% of this time was spent in counseling and coordination of care for patient's illness as documented.

## 2017-01-18 NOTE — Progress Notes (Signed)
Subjective:    Patient ID: [name], male    DOB: 10-28-1951, 66 y.o.   MRN: [id number]  HPI  Here for wellness and f/u;  Overall doing ok;  Pt denies Chest pain, worsening SOB, DOE, wheezing, orthopnea, PND, worsening LE edema, palpitations, dizziness or syncope.  Pt denies neurological change such as new headache, facial or extremity weakness.  Pt denies polydipsia, polyuria, or low sugar symptoms. Pt states overall good compliance with treatment and medications, good tolerability, and has been trying to follow appropriate diet.  Pt denies worsening depressive symptoms, suicidal ideation or panic. No fever, night sweats, wt loss, loss of appetite, or other constitutional symptoms.  Pt states good ability with ADL's, has low fall risk, home safety reviewed and adequate, no other significant changes in hearing or vision, and only occasionally active with exercise. Remains disabled since 1999 after peritonitis episode.  Still goes to the GYM every day, has lost 40 lbs form peak wt but stable now. Hard to lose more Wt Readings from Last 3 Encounters:  01/18/17 241 lb (109.3 kg)  01/09/17 243 lb 12.8 oz (110.6 kg)  12/11/16 242 lb 4 oz (109.9 kg)  Due for f/u colonoscopy soon after large polyp jan 2017.   Has had some concern recently per Dr Vertell Limber about recurring Trigem neuralgia and has been referred to Dr Wilburn Cornelia  States BP at home < 140/990 and does not want further med change  Does also have persistent rosacea for several yrs, has seen derm, topical metronidazole not working well, remains quite active and some uncomfortable, asks for change in tx. Past Medical History:  Diagnosis Date  . Allergic rhinitis   . Anemia, pernicious   . Anxiety and depression    sees Dr. [name]  . Arthritis   . Catheter-associated urinary tract infection ([place])   . Diverticulosis of colon   . Elevated PSA    and hypogonadism, sees urologist  . Facial asymmetry   . GERD (gastroesophageal reflux  disease)    hiatal hernia  . HTN (hypertension)   . Hx of blood transfusion reaction   . Hyperlipidemia   . Hyperparathyroidism ([place])   . Hyperplastic colon polyp   . Hypertension    acei causes cough  . IBS (irritable bowel syndrome)   . Nephrolithiasis   . Obstructive chronic bronchitis without exacerbation, followed by Dr. [name] 03/04/2009   ONO RA  Normal 07/28/11 6 min walk >557m no desat PFTs 08/08/11: DLCO 70% TLC 80%  FeV1 80%   Fef 25 75 56% with significant improvement after BD   . Peritonitis ([place])    after surgery in 1999  . PONV (postoperative nausea and vomiting)   . Rectal fissure   . Trigeminal neuralgia    has facial asymetry  . Ventral hernia    Past Surgical History:  Procedure Laterality Date  . ABDOMINAL SURGERY     Multiple  . BRAIN SURGERY  March 2012   Trigeminal nerve  . CHOLECYSTECTOMY  1981  . HAND SURGERY  1973   Left  . [place]   Left  . LITHOTRIPSY     Right  . NISSEN FUNDOPLICATION  7893    complicated by gastric perforation, peritonitis, ventral nernia   . RHIZOTOMY Right 11/17/2014   Procedure: RHIZOTOMY TO RIGHT V2,V3;  Surgeon: Erline Levine, MD;  Location: [facility];  Service: Neurosurgery;  Laterality: Right;  right  . RHIZOTOMY Right  12/22/2014   Procedure: Right V2 and V3 trigeminal rhysolysis;  Surgeon: Erline Levine, MD;  Location: [facility];  Service: Neurosurgery;  Laterality: Right;  Right V2 and V3 trigeminal rhysolysis  . UPPER GASTROINTESTINAL ENDOSCOPY    . VENTRAL HERNIA REPAIR      reports that he quit smoking about 20 years ago. His smoking use included Cigarettes. He has a 20.00 pack-year smoking history. He quit smokeless tobacco use about 33 years ago. His smokeless tobacco use included Chew. He reports that he does not drink alcohol or use drugs. family history includes Clotting disorder in his brother; Coronary artery disease in his mother; Diabetes in his brother, mother, and sister;  Hypertension in his brother. Allergies  Allergen Reactions  . Ace Inhibitors Cough  . Codeine Nausea And Vomiting  . Sulfa Antibiotics     hives   Current Outpatient Prescriptions on File Prior to Visit  Medication Sig Dispense Refill  . acetaminophen (TYLENOL) 325 MG tablet Take 650 mg by mouth every 6 (six) hours as needed (pain).    Marland Kitchen ALPRAZolam (XANAX) 1 MG tablet Take 1 mg by mouth at bedtime as needed for anxiety.     Marland Kitchen aspirin EC 81 MG tablet Take 1 tablet (81 mg total) by mouth daily. 90 tablet 11  . carbamazepine (CARBATROL) 100 MG 12 hr capsule Take 1 capsule (100 mg total) by mouth 2 (two) times daily. 30 capsule 0  . gabapentin (NEURONTIN) 400 MG capsule Take 800 mg by mouth 3 (three) times daily.     Marland Kitchen losartan-hydrochlorothiazide (HYZAAR) 50-12.5 MG tablet TAKE 1 TABLET BY MOUTH EVERY DAY 90 tablet 0  . NEXIUM 40 MG capsule Take 1 capsule (40 mg total) by mouth daily. 90 capsule 3  . NON FORMULARY TMLAE-all natural mens supplement    . polyethylene glycol powder (GLYCOLAX/MIRALAX) powder DISSOLVE 1 CAPFUL IN LIQUID EVERY DAY AS NEEDED FOR CONSTIPATION 527 g 2  . [DISCONTINUED] losartan (COZAAR) 50 MG tablet Take 1 tablet (50 mg total) by mouth daily. 30 tablet 6   No current facility-administered medications on file prior to visit.    Review of Systems Constitutional: Negative for increased diaphoresis, or other activity, appetite or siginficant weight change other than noted HENT: Negative for worsening hearing loss, ear pain, facial swelling, mouth sores and neck stiffness.   Eyes: Negative for other worsening pain, redness or visual disturbance.  Respiratory: Negative for choking or stridor Cardiovascular: Negative for other chest pain and palpitations.  Gastrointestinal: Negative for worsening diarrhea, blood in stool, or abdominal distention Genitourinary: Negative for hematuria, flank pain or change in urine volume.  Musculoskeletal: Negative for myalgias or other  joint complaints.  Skin: Negative for other color change and wound or drainage.  Neurological: Negative for syncope and numbness. other than noted Hematological: Negative for adenopathy. or other swelling Psychiatric/Behavioral: Negative for hallucinations, SI, self-injury, decreased concentration or other worsening agitation.  All other system neg per pt    Objective:   Physical Exam BP (!) 144/88   Pulse 64   Temp 97.6 F (36.4 C)   Ht 5\' 11"  (1.803 m)   Wt 241 lb (109.3 kg)   SpO2 98%   BMI 33.61 kg/m  VS noted,  Constitutional: Pt is oriented to person, place, and time. Appears well-developed and well-nourished, in no significant distress Head: Normocephalic and atraumatic  Eyes: Conjunctivae and EOM are normal. Pupils are equal, round, and reactive to light Right Ear: External ear normal.  Left Ear:  External ear normal Nose: Nose normal.  Mouth/Throat: Oropharynx is clear and moist  Neck: Normal range of motion. Neck supple. No JVD present. No tracheal deviation present or significant neck LA or mass Cardiovascular: Normal rate, regular rhythm, normal heart sounds and intact distal pulses.   Pulmonary/Chest: Effort normal and breath sounds without rales or wheezing  Abdominal: Soft. Bowel sounds are normal. NT. No HSM  Musculoskeletal: Normal range of motion. Exhibits no edema Lymphadenopathy: Has no cervical adenopathy.  Neurological: Pt is alert and oriented to person, place, and time. Pt has normal reflexes. No cranial nerve deficit. Motor grossly intact Skin: Skin is warm and dry. No rash noted or new ulcers except for bilat faces and nasal with severe nontender erythema without swelling or ulcer Psychiatric:  Has normal mood and affect. Behavior is normal.  No other exam findings  ECG today I have personally interpreted Sinus  Rhythm  WITHIN NORMAL LIMITS     Assessment & Plan:

## 2017-01-19 ENCOUNTER — Other Ambulatory Visit: Payer: Self-pay

## 2017-01-19 ENCOUNTER — Telehealth: Payer: Self-pay | Admitting: Gastroenterology

## 2017-01-19 DIAGNOSIS — R69 Illness, unspecified: Secondary | ICD-10-CM | POA: Diagnosis not present

## 2017-01-19 DIAGNOSIS — E538 Deficiency of other specified B group vitamins: Secondary | ICD-10-CM

## 2017-01-19 LAB — HIV ANTIBODY (ROUTINE TESTING W REFLEX): HIV 1&2 Ab, 4th Generation: NONREACTIVE

## 2017-01-19 MED ORDER — CYANOCOBALAMIN 1000 MCG/ML IJ SOLN: 1000.0000 ug | INTRAMUSCULAR | 11 refills | Status: DC

## 2017-01-19 MED ORDER — "SYRINGE/NEEDLE (DISP) 22G X 1-1/2"" 3 ML MISC": 1.0000 | 0 refills | Status: DC

## 2017-01-19 NOTE — Telephone Encounter (Signed)
Patient though we kept the medication B12 here in the office, I explained that he would have to bring in his b12 injection and we would administer the B12 shot here.  He declined and said that since he had to pick up prescription he would just give it to himself .

## 2017-01-19 NOTE — Telephone Encounter (Signed)
Called [facility] they never received prescription of B12  Sent in verbal prescription over the phone for patient

## 2017-01-22 DIAGNOSIS — R69 Illness, unspecified: Secondary | ICD-10-CM | POA: Diagnosis not present

## 2017-01-22 MED ORDER — "SYRINGE/NEEDLE (DISP) 22G X 1-1/2"" 3 ML MISC": 1.0000 | 0 refills | Status: DC

## 2017-01-22 MED ORDER — CYANOCOBALAMIN 1000 MCG/ML IJ SOLN: 1000.0000 ug | INTRAMUSCULAR | 11 refills | Status: DC

## 2017-01-22 NOTE — Addendum Note (Signed)
Addended by: Oda Kilts on: 01/22/2017 02:12 PM   Modules accepted: Orders

## 2017-01-22 NOTE — Telephone Encounter (Signed)
Resent rx to [facility] in [place], called pharmacy to verify they got it and called patient to tell them it was resent

## 2017-01-22 NOTE — Telephone Encounter (Signed)
Patient wife called back in stating that this medication needs to be sent to [facility] in [place]. Please resend. Best # [phone] or [phone]

## 2017-01-26 DIAGNOSIS — J0141 Acute recurrent pansinusitis: Secondary | ICD-10-CM | POA: Insufficient documentation

## 2017-01-26 DIAGNOSIS — J302 Other seasonal allergic rhinitis: Secondary | ICD-10-CM | POA: Diagnosis not present

## 2017-01-26 DIAGNOSIS — G500 Trigeminal neuralgia: Secondary | ICD-10-CM | POA: Diagnosis not present

## 2017-02-07 ENCOUNTER — Telehealth: Payer: Self-pay | Admitting: Gastroenterology

## 2017-02-07 NOTE — Telephone Encounter (Signed)
He does have large para esophageal hernia and Cameron's erosions. Given he continues to have persistent nausea, ok to schedule EGD along with colonoscopy. Please advise patient to continue Nexium daily, 30 min before breakfast and Zantac 150mg  at bedtime. Thanks

## 2017-02-07 NOTE — Telephone Encounter (Signed)
EGD has been added to the colon and the appt time has changed to 230 pm. The pt was re instructed and verbalize understanding.  He asked for a suprep sample and one has been left at the front desk for pick up.

## 2017-02-07 NOTE — Telephone Encounter (Signed)
Took augmentin and the nausea began after taking 3 pills and so he stopped the augmentin.  Last dose was 2 weeks ago, nausea has not improved.  Has colon set up for 03/05/17.  He wants to know if he needs an EGD added. Please advise

## 2017-02-09 DIAGNOSIS — M5441 Lumbago with sciatica, right side: Secondary | ICD-10-CM | POA: Diagnosis not present

## 2017-02-09 DIAGNOSIS — M4687 Other specified inflammatory spondylopathies, lumbosacral region: Secondary | ICD-10-CM | POA: Diagnosis not present

## 2017-02-13 ENCOUNTER — Telehealth: Payer: Self-pay | Admitting: Internal Medicine

## 2017-02-13 NOTE — Telephone Encounter (Signed)
Pts wife called about a bill that they received. She said that her and her husband were here to see Dr [name] for a physical on 01/18/17. She said that they do not have to pay for physicals or office visits but she received a bill for $40 from that date. She talked with billing and was told that he was charged $3+ for an EKG that he had done and $36 for an "office visit" but she said all they did was the physical with an EKG. She wanted to know if you could talk with her about this to see what is causing these charges.

## 2017-02-19 ENCOUNTER — Encounter: Payer: Self-pay | Admitting: Gastroenterology

## 2017-02-20 NOTE — Telephone Encounter (Signed)
Spoke with patient 02/15/17, advised sending email to billing for assistance and advised her to contact insurance as well.

## 2017-02-21 DIAGNOSIS — G500 Trigeminal neuralgia: Secondary | ICD-10-CM | POA: Diagnosis not present

## 2017-02-21 DIAGNOSIS — J3489 Other specified disorders of nose and nasal sinuses: Secondary | ICD-10-CM | POA: Diagnosis not present

## 2017-02-21 DIAGNOSIS — J0141 Acute recurrent pansinusitis: Secondary | ICD-10-CM | POA: Diagnosis not present

## 2017-02-21 DIAGNOSIS — J302 Other seasonal allergic rhinitis: Secondary | ICD-10-CM | POA: Diagnosis not present

## 2017-02-26 ENCOUNTER — Telehealth: Payer: Self-pay

## 2017-02-26 NOTE — Telephone Encounter (Signed)
Patient is on Gabapentin 3 times daily. His second dose of the day is usually at 2:00 pm. He will take this at 12 to 12:30. He takes Tegretol 2 times a day. He will take this at his usual times. He takes Miralax daily. He will continue this until he starts his prep. He will then hold it. Resuming the medications will be determined after his procedure.

## 2017-02-26 NOTE — Telephone Encounter (Signed)
Left message to call back with questions. Voicemail identified as Pensions consultant

## 2017-03-05 ENCOUNTER — Ambulatory Visit (AMBULATORY_SURGERY_CENTER): Payer: Medicare HMO | Admitting: Gastroenterology

## 2017-03-05 ENCOUNTER — Encounter: Payer: Medicare HMO | Admitting: Gastroenterology

## 2017-03-05 ENCOUNTER — Encounter: Payer: Self-pay | Admitting: Gastroenterology

## 2017-03-05 VITALS — BP 141/91 | HR 66 | Temp 97.5°F | Resp 10 | Ht 71.0 in | Wt 243.0 lb

## 2017-03-05 DIAGNOSIS — D125 Benign neoplasm of sigmoid colon: Secondary | ICD-10-CM

## 2017-03-05 DIAGNOSIS — D123 Benign neoplasm of transverse colon: Secondary | ICD-10-CM | POA: Diagnosis not present

## 2017-03-05 DIAGNOSIS — D128 Benign neoplasm of rectum: Secondary | ICD-10-CM | POA: Diagnosis not present

## 2017-03-05 DIAGNOSIS — K449 Diaphragmatic hernia without obstruction or gangrene: Secondary | ICD-10-CM

## 2017-03-05 DIAGNOSIS — Z86010 Personal history of colonic polyps: Secondary | ICD-10-CM

## 2017-03-05 DIAGNOSIS — K2100 Gastro-esophageal reflux disease with esophagitis, without bleeding: Secondary | ICD-10-CM

## 2017-03-05 DIAGNOSIS — R112 Nausea with vomiting, unspecified: Secondary | ICD-10-CM

## 2017-03-05 DIAGNOSIS — I10 Essential (primary) hypertension: Secondary | ICD-10-CM | POA: Diagnosis not present

## 2017-03-05 DIAGNOSIS — K635 Polyp of colon: Secondary | ICD-10-CM

## 2017-03-05 DIAGNOSIS — K621 Rectal polyp: Secondary | ICD-10-CM

## 2017-03-05 DIAGNOSIS — D122 Benign neoplasm of ascending colon: Secondary | ICD-10-CM

## 2017-03-05 MED ORDER — SODIUM CHLORIDE 0.9 % IV SOLN: 500.0000 mL | INTRAVENOUS | Status: DC

## 2017-03-05 NOTE — Progress Notes (Signed)
Pt's states no medical or surgical changes since previsit or office visit. 

## 2017-03-05 NOTE — Progress Notes (Signed)
A/ox3, pleased with MAC, report to RN 

## 2017-03-05 NOTE — Patient Instructions (Signed)
YOU HAD AN ENDOSCOPIC PROCEDURE TODAY AT [facility]:   Refer to the procedure report that was given to you for any specific questions about what was found during the examination.  If the procedure report does not answer your questions, please call your gastroenterologist to clarify.  If you requested that your care partner not be given the details of your procedure findings, then the procedure report has been included in a sealed envelope for you to review at your convenience later.  YOU SHOULD EXPECT: Some feelings of bloating in the abdomen. Passage of more gas than usual.  Walking can help get rid of the air that was put into your GI tract during the procedure and reduce the bloating. If you had a lower endoscopy (such as a colonoscopy or flexible sigmoidoscopy) you may notice spotting of blood in your stool or on the toilet paper. If you underwent a bowel prep for your procedure, you may not have a normal bowel movement for a few days.  Please Note:  You might notice some irritation and congestion in your nose or some drainage.  This is from the oxygen used during your procedure.  There is no need for concern and it should clear up in a day or so.  SYMPTOMS TO REPORT IMMEDIATELY:   Following lower endoscopy (colonoscopy or flexible sigmoidoscopy):  Excessive amounts of blood in the stool  Significant tenderness or worsening of abdominal pains  Swelling of the abdomen that is new, acute  Fever of 100F or higher   Following upper endoscopy (EGD)  Vomiting of blood or coffee ground material  New chest pain or pain under the shoulder blades  Painful or persistently difficult swallowing  New shortness of breath  Fever of 100F or higher  Black, tarry-looking stools  For urgent or emergent issues, a gastroenterologist can be reached at any hour by calling [phone].   DIET:  We do recommend a small meal at first, but then you may proceed to your regular diet.  Drink  plenty of fluids but you should avoid alcoholic beverages for 24 hours.  ACTIVITY:  You should plan to take it easy for the rest of today and you should NOT DRIVE or use heavy machinery until tomorrow (because of the sedation medicines used during the test).    FOLLOW UP: Our staff will call the number listed on your records the next business day following your procedure to check on you and address any questions or concerns that you may have regarding the information given to you following your procedure. If we do not reach you, we will leave a message.  However, if you are feeling well and you are not experiencing any problems, there is no need to return our call.  We will assume that you have returned to your regular daily activities without incident.  If any biopsies were taken you will be contacted by phone or by letter within the next 1-3 weeks.  Please call us at [phone] if you have not heard about the biopsies in 3 weeks.    SIGNATURES/CONFIDENTIALITY: You and/or your care partner have signed paperwork which will be entered into your electronic medical record.  These signatures attest to the fact that that the information above on your After Visit Summary has been reviewed and is understood.  Full responsibility of the confidentiality of this discharge information lies with you and/or your care-partner.    Handouts were given to your care partner on Hernia,  polyps, diverticulosis, and hemorrhoids. You may resume your current medications today. Await biopsy results. Please call if any questions or concerns.

## 2017-03-05 NOTE — Progress Notes (Signed)
No problems noted in the recovery room. maw 

## 2017-03-05 NOTE — Op Note (Signed)
[facility] Patient Name: [name] Procedure Date: 03/05/2017 2:23 PM MRN: [id number] Endoscopist: Mauri Pole , MD Age: 66 Referring MD:  Date of Birth: Jan 18, 1951 Gender: Male Account #: [id number] Procedure:                Upper GI endoscopy Indications:              Esophageal reflux symptoms that recur despite                            appropriate therapy, Upper abdominal symptoms that                            persist despite an appropriate trial of therapy Medicines:                Monitored Anesthesia Care Procedure:                Pre-Anesthesia Assessment:                           - Prior to the procedure, a History and Physical                            was performed, and patient medications and                            allergies were reviewed. The patient's tolerance of                            previous anesthesia was also reviewed. The risks                            and benefits of the procedure and the sedation                            options and risks were discussed with the patient.                            All questions were answered, and informed consent                            was obtained. Prior Anticoagulants: The patient has                            taken no previous anticoagulant or antiplatelet                            agents. ASA Grade Assessment: II - A patient with                            mild systemic disease. After reviewing the risks                            and benefits, the patient was deemed in  satisfactory condition to undergo the procedure.                           After obtaining informed consent, the endoscope was                            passed under direct vision. Throughout the                            procedure, the patient's blood pressure, pulse, and                            oxygen saturations were monitored continuously. The   Endoscope was introduced through the mouth, and                            advanced to the second part of duodenum. The upper                            GI endoscopy was accomplished without difficulty.                            The patient tolerated the procedure well. Scope In: Scope Out: Findings:                 The esophagus and gastroesophageal junction were                            examined with white light and narrow band imaging                            (NBI) from a forward view and retroflexed position.                            There were esophageal mucosal changes suspicious                            for short-segment Barrett's esophagus. These                            changes involved the mucosa at the upper extent of                            the gastric folds (36 cm from the incisors)                            extending to the Z-line (34 cm from the incisors).                            Circumferential salmon-colored mucosa was present                            at 35 cm and one tongue of salmon-colored mucosa  was present at 34 cm. The maximum longitudinal                            extent of these esophageal mucosal changes was 2 cm                            in length. Mucosa was biopsied with a cold forceps                            for histology in 4 quadrants at intervals of 1 cm                            in the lower third of the esophagus. One specimen                            bottle was sent to pathology.                           LA Grade B (one or more mucosal breaks greater than                            5 mm, not extending between the tops of two mucosal                            folds) esophagitis with no bleeding was found 31 to                            35 cm from the incisors.                           A large paraesophageal hernia was found. The                            proximal extent of the gastric folds (end  of                            tubular esophagus) was 36 cm from the incisors. The                            hiatal narrowing was 42 cm from the incisors.                           The examined duodenum was normal. Complications:            No immediate complications. Estimated Blood Loss:     Estimated blood loss was minimal. Impression:               - Esophageal mucosal changes suspicious for                            short-segment Barrett's esophagus. Biopsied.                           -  LA Grade B reflux esophagitis. Rule out Barrett's                            esophagus.                           - Large paraesophageal hernia.                           - Normal examined duodenum. Recommendation:           - Patient has a contact number available for                            emergencies. The signs and symptoms of potential                            delayed complications were discussed with the                            patient. Return to normal activities tomorrow.                            Written discharge instructions were provided to the                            patient.                           - Resume previous diet.                           - Continue present medications.                           - Await pathology results.                           - Repeat upper endoscopy after studies are complete                            for surveillance based on pathology results. Mauri Pole, MD 03/05/2017 3:11:11 PM This report has been signed electronically.

## 2017-03-05 NOTE — Progress Notes (Signed)
Called to room to assist during endoscopic procedure.  Patient ID and intended procedure confirmed with present staff. Received instructions for my participation in the procedure from the performing physician.  

## 2017-03-05 NOTE — Op Note (Signed)
[place] Patient Name: [name] Procedure Date: 03/05/2017 2:23 PM MRN: [id number] Endoscopist: Mauri Pole , MD Age: 67 Referring MD:  Date of Birth: [birth year] Gender: Male Account #: [id number] Procedure:                Colonoscopy Indications:              High risk colon cancer surveillance: Personal                            history of adenoma (10 mm or greater in size), High                            risk colon cancer surveillance: Personal history of                            multiple (3 or more) adenomas Medicines:                Monitored Anesthesia Care Procedure:                Pre-Anesthesia Assessment:                           - Prior to the procedure, a History and Physical                            was performed, and patient medications and                            allergies were reviewed. The patient's tolerance of                            previous anesthesia was also reviewed. The risks                            and benefits of the procedure and the sedation                            options and risks were discussed with the patient.                            All questions were answered, and informed consent                            was obtained. Prior Anticoagulants: The patient has                            taken no previous anticoagulant or antiplatelet                            agents. ASA Grade Assessment: II - A patient with                            mild systemic disease. After reviewing the risks  and benefits, the patient was deemed in                            satisfactory condition to undergo the procedure.                           After obtaining informed consent, the colonoscope                            was passed under direct vision. Throughout the                            procedure, the patient's blood pressure, pulse, and                            oxygen saturations were  monitored continuously. The                            Colonoscope was introduced through the anus and                            advanced to the the terminal ileum, with                            identification of the appendiceal orifice and IC                            valve. The colonoscopy was performed without                            difficulty. The patient tolerated the procedure                            well. The quality of the bowel preparation was                            good. The terminal ileum, ileocecal valve,                            appendiceal orifice, and rectum were photographed. Scope In: 2:42:06 PM Scope Out: 3:04:07 PM Scope Withdrawal Time: 0 hours 14 minutes 24 seconds  Total Procedure Duration: 0 hours 22 minutes 1 second  Findings:                 The perianal and digital rectal examinations were                            normal.                           Four sessile polyps were found in the rectum,                            sigmoid colon and transverse colon. The polyps were  1 to 2 mm in size. These polyps were removed with a                            cold biopsy forceps. Resection and retrieval were                            complete.                           A 4 mm polyp was found in the ascending colon. The                            polyp was sessile. The polyp was removed with a                            cold snare. Resection and retrieval were complete.                           Scattered small and large-mouthed diverticula were                            found in the sigmoid colon, descending colon,                            transverse colon and ascending colon.                           Non-bleeding internal hemorrhoids were found during                            retroflexion. The hemorrhoids were small. Complications:            No immediate complications. Estimated Blood Loss:     Estimated blood loss was  minimal. Impression:               - Four 1 to 2 mm polyps in the rectum, in the                            sigmoid colon and in the transverse colon, removed                            with a cold biopsy forceps. Resected and retrieved.                           - One 4 mm polyp in the ascending colon, removed                            with a cold snare. Resected and retrieved.                           - Diverticulosis in the sigmoid colon, in the                            descending  colon, in the transverse colon and in                            the ascending colon.                           - Non-bleeding internal hemorrhoids. Recommendation:           - Patient has a contact number available for                            emergencies. The signs and symptoms of potential                            delayed complications were discussed with the                            patient. Return to normal activities tomorrow.                            Written discharge instructions were provided to the                            patient.                           - Resume previous diet.                           - Continue present medications.                           - Await pathology results.                           - Repeat colonoscopy in 3 years for surveillance                            based on pathology results. Mauri Pole, MD 03/05/2017 3:14:33 PM This report has been signed electronically.

## 2017-03-06 ENCOUNTER — Telehealth: Payer: Self-pay

## 2017-03-06 NOTE — Telephone Encounter (Signed)
  Follow up Call-  Call back number 03/05/2017 11/22/2015  Post procedure Call Back phone  # [phone]  Permission to leave phone message Yes Yes  Some recent data might be hidden     Patient questions:  Do you have a fever, pain , or abdominal swelling? No. Pain Score  0 *  Have you tolerated food without any problems? Yes.    Have you been able to return to your normal activities? Yes.    Do you have any questions about your discharge instructions: Diet   No. Medications  No. Follow up visit  No.  Do you have questions or concerns about your Care? No.  Actions: * If pain score is 4 or above: No action needed, pain <4.  I spoke with the pt's wife.  She reported her husband did not have any problems. maw

## 2017-03-07 DIAGNOSIS — G500 Trigeminal neuralgia: Secondary | ICD-10-CM | POA: Diagnosis not present

## 2017-03-07 DIAGNOSIS — Z6833 Body mass index (BMI) 33.0-33.9, adult: Secondary | ICD-10-CM | POA: Diagnosis not present

## 2017-03-07 DIAGNOSIS — I10 Essential (primary) hypertension: Secondary | ICD-10-CM | POA: Diagnosis not present

## 2017-03-12 ENCOUNTER — Encounter: Payer: Self-pay | Admitting: Gastroenterology

## 2017-03-14 NOTE — Telephone Encounter (Signed)
Per [employer], [employer] Falling is reprocessing this claim [facility] 01/18/17) as their should be NO copay. Pt informed via MyChart.

## 2017-03-18 ENCOUNTER — Other Ambulatory Visit: Payer: Self-pay | Admitting: Family Medicine

## 2017-03-18 DIAGNOSIS — I10 Essential (primary) hypertension: Secondary | ICD-10-CM

## 2017-03-19 ENCOUNTER — Other Ambulatory Visit: Payer: Self-pay | Admitting: *Deleted

## 2017-03-19 MED ORDER — LOSARTAN POTASSIUM-HCTZ 50-12.5 MG PO TABS: 1.0000 | ORAL_TABLET | Freq: Every day | ORAL | 2 refills | Status: DC

## 2017-04-09 ENCOUNTER — Telehealth: Payer: Self-pay | Admitting: Internal Medicine

## 2017-04-09 NOTE — Telephone Encounter (Signed)
Ins company called stating that patient has filed a dispute of charges from provider on 3/15.  States patient came in for physical.  States patient admits they spoke about rosacea but Md did not prescribe medication for this.  Patient would like charges changed during [place].  Otila Kluver with Grievance Department is requesting follow up call in regard.

## 2017-04-12 NOTE — Telephone Encounter (Signed)
I do not see a call back # for patients insurer, but they should be referred to our billing office to dispute/appeal. Billing will contact us if we need to be involved, but they should be able to make the decision based on documentation.

## 2017-04-12 NOTE — Telephone Encounter (Signed)
Left vm with tina giving her billings phone number.

## 2017-04-18 DIAGNOSIS — R972 Elevated prostate specific antigen [PSA]: Secondary | ICD-10-CM | POA: Diagnosis not present

## 2017-04-25 DIAGNOSIS — N400 Enlarged prostate without lower urinary tract symptoms: Secondary | ICD-10-CM | POA: Diagnosis not present

## 2017-04-25 DIAGNOSIS — R972 Elevated prostate specific antigen [PSA]: Secondary | ICD-10-CM | POA: Diagnosis not present

## 2017-06-13 DIAGNOSIS — L814 Other melanin hyperpigmentation: Secondary | ICD-10-CM | POA: Diagnosis not present

## 2017-06-13 DIAGNOSIS — D229 Melanocytic nevi, unspecified: Secondary | ICD-10-CM | POA: Diagnosis not present

## 2017-06-13 DIAGNOSIS — L219 Seborrheic dermatitis, unspecified: Secondary | ICD-10-CM | POA: Diagnosis not present

## 2017-07-06 ENCOUNTER — Ambulatory Visit (INDEPENDENT_AMBULATORY_CARE_PROVIDER_SITE_OTHER): Payer: Medicare Other | Admitting: Internal Medicine

## 2017-07-06 ENCOUNTER — Encounter: Payer: Self-pay | Admitting: Internal Medicine

## 2017-07-06 VITALS — BP 136/88 | HR 73 | Temp 98.0°F | Ht 71.0 in | Wt 242.0 lb

## 2017-07-06 DIAGNOSIS — R739 Hyperglycemia, unspecified: Secondary | ICD-10-CM

## 2017-07-06 DIAGNOSIS — J3089 Other allergic rhinitis: Secondary | ICD-10-CM | POA: Diagnosis not present

## 2017-07-06 DIAGNOSIS — J019 Acute sinusitis, unspecified: Secondary | ICD-10-CM

## 2017-07-06 DIAGNOSIS — G500 Trigeminal neuralgia: Secondary | ICD-10-CM | POA: Diagnosis not present

## 2017-07-06 MED ORDER — PREDNISONE 10 MG PO TABS: ORAL_TABLET | ORAL | 0 refills | Status: DC

## 2017-07-06 MED ORDER — AMOXICILLIN 500 MG PO CAPS: 1000.0000 mg | ORAL_CAPSULE | Freq: Two times a day (BID) | ORAL | 0 refills | Status: DC

## 2017-07-06 NOTE — Assessment & Plan Note (Signed)
Mild to mod, for antibx course,  to f/u any worsening symptoms or concerns 

## 2017-07-06 NOTE — Assessment & Plan Note (Signed)
To continue claritin and flonase, nettie pot,  to f/u any worsening symptoms or concerns

## 2017-07-06 NOTE — Patient Instructions (Signed)
Please take all new medication as prescribed - the antibiotic, and prednisone  Please continue all other medications as before, and refills have been done if requested.  Please have the pharmacy call with any other refills you may need.  Please keep your appointments with your specialists as you may have planned      

## 2017-07-06 NOTE — Assessment & Plan Note (Signed)
Ok for predpac as has helped in the past,  to f/u any worsening symptoms or concerns

## 2017-07-06 NOTE — Progress Notes (Signed)
Subjective:    Patient ID: [name], male    DOB: [birth year], 66 y.o.   MRN: [id number]  HPI   Here with 2-3 days acute onset fever, facial pain, pressure, headache, general weakness and malaise, and greenish d/c, with mild ST and cough, but pt denies chest pain, wheezing, increased sob or doe, orthopnea, PND, increased LE swelling, palpitations, dizziness or syncope. Has long hx of recurrent, has last seen ENT Dr Wilburn Cornelia about 2 mo ago, but had n/v with augmentin.  Symptoms not better with claritin, flonase, afrin and nettie pot, but just not better. Nothing makes worse except does also have 2-3 days incidentally worsening flare of facial pain c/w with his Trigeminal Neuralgia.   Pt denies polydipsia, polyuria Past Medical History:  Diagnosis Date  . Allergic rhinitis   . Anemia, pernicious   . Anxiety and depression    sees Dr. [name]  . Arthritis   . Catheter-associated urinary tract infection ([place])   . Diverticulosis of colon   . Elevated PSA    and hypogonadism, sees urologist  . Facial asymmetry   . GERD (gastroesophageal reflux disease)    hiatal hernia  . HTN (hypertension)   . Hx of blood transfusion reaction   . Hyperlipidemia   . Hyperparathyroidism ([place])   . Hyperplastic colon polyp   . Hypertension    acei causes cough  . IBS (irritable bowel syndrome)   . Nephrolithiasis   . Obstructive chronic bronchitis without exacerbation, followed by Dr. [name] 03/04/2009   ONO RA  Normal 07/28/11 6 min walk >543m no desat PFTs 08/08/11: DLCO 70% TLC 80%  FeV1 80%   Fef 25 75 56% with significant improvement after BD   . Peritonitis ([place])    after surgery in 1999  . PONV (postoperative nausea and vomiting)   . Rectal fissure   . Trigeminal neuralgia    has facial asymetry  . Ventral hernia    Past Surgical History:  Procedure Laterality Date  . ABDOMINAL SURGERY     Multiple  . BRAIN SURGERY  March 2012   Trigeminal nerve  . CHOLECYSTECTOMY  1981  . HAND  SURGERY  1973   Left  . [place]   Left  . LITHOTRIPSY     Right  . NISSEN FUNDOPLICATION  4098    complicated by gastric perforation, peritonitis, ventral nernia   . RHIZOTOMY Right 11/17/2014   Procedure: RHIZOTOMY TO RIGHT V2,V3;  Surgeon: Erline Levine, MD;  Location: [facility];  Service: Neurosurgery;  Laterality: Right;  right  . RHIZOTOMY Right 12/22/2014   Procedure: Right V2 and V3 trigeminal rhysolysis;  Surgeon: Erline Levine, MD;  Location: [facility];  Service: Neurosurgery;  Laterality: Right;  Right V2 and V3 trigeminal rhysolysis  . UPPER GASTROINTESTINAL ENDOSCOPY    . VENTRAL HERNIA REPAIR      reports that he quit smoking about 20 years ago. His smoking use included Cigarettes. He has a 20.00 pack-year smoking history. He quit smokeless tobacco use about 33 years ago. His smokeless tobacco use included Chew. He reports that he does not drink alcohol or use drugs. family history includes Clotting disorder in his brother; Coronary artery disease in his mother; Diabetes in his brother, mother, and sister; Hypertension in his brother. Allergies  Allergen Reactions  . Ace Inhibitors Cough  . Augmentin [Amoxicillin-Pot Clavulanate] Nausea And Vomiting  . Codeine Nausea And Vomiting  . Sulfa  Antibiotics     hives   Current Outpatient Prescriptions on File Prior to Visit  Medication Sig Dispense Refill  . acetaminophen (TYLENOL) 325 MG tablet Take 650 mg by mouth every 6 (six) hours as needed (pain).    Marland Kitchen ALPRAZolam (XANAX) 1 MG tablet Take 1 mg by mouth at bedtime as needed for anxiety.     Marland Kitchen aspirin EC 81 MG tablet Take 1 tablet (81 mg total) by mouth daily. 90 tablet 11  . carbamazepine (CARBATROL) 100 MG 12 hr capsule Take 1 capsule (100 mg total) by mouth 2 (two) times daily. 30 capsule 0  . cyanocobalamin (,VITAMIN B-12,) 1000 MCG/ML injection Inject 1 mL (1,000 mcg total) into the skin every 30 (thirty) days. 1 mL 11  . gabapentin  (NEURONTIN) 400 MG capsule Take 800 mg by mouth 3 (three) times daily.     Marland Kitchen losartan-hydrochlorothiazide (HYZAAR) 50-12.5 MG tablet Take 1 tablet by mouth daily. 90 tablet 2  . NEXIUM 40 MG capsule Take 1 capsule (40 mg total) by mouth daily. 90 capsule 3  . NON FORMULARY TMLAE-all natural mens supplement    . polyethylene glycol powder (GLYCOLAX/MIRALAX) powder DISSOLVE 1 CAPFUL IN LIQUID EVERY DAY AS NEEDED FOR CONSTIPATION 527 g 2  . SYRINGE-NEEDLE, DISP, 3 ML 22G X 1-1/2" 3 ML MISC 1 Syringe by Does not apply route every 30 (thirty) days. 30 each 0  . [DISCONTINUED] losartan (COZAAR) 50 MG tablet Take 1 tablet (50 mg total) by mouth daily. 30 tablet 6   Current Facility-Administered Medications on File Prior to Visit  Medication Dose Route Frequency Provider Last Rate Last Dose  . 0.9 %  sodium chloride infusion  500 mL Intravenous Continuous Nandigam, Kavitha V, MD       Review of Systems  Constitutional: Negative for other unusual diaphoresis or sweats HENT: Negative for ear discharge or swelling Eyes: Negative for other worsening visual disturbances Respiratory: Negative for stridor or other swelling  Gastrointestinal: Negative for worsening distension or other blood Genitourinary: Negative for retention or other urinary change Musculoskeletal: Negative for other MSK pain or swelling Skin: Negative for color change or other new lesions Neurological: Negative for worsening tremors and other numbness  Psychiatric/Behavioral: Negative for worsening agitation or other fatigue All other system neg per pt    Objective:   Physical Exam BP 136/88   Pulse 73   Temp 98 F (36.7 C) (Oral)   Ht 5\' 11"  (1.803 m)   Wt 242 lb (109.8 kg)   SpO2 98%   BMI 33.75 kg/m  VS noted,  Constitutional: Pt appears in NAD HENT: Head: NCAT.  Right Ear: External ear normal.  Left Ear: External ear normal.  Eyes: . Pupils are equal, round, and reactive to light. Conjunctivae and EOM are  normal Nose: without d/c or deformity Left tm's with mild erythema.  Max sinus areas mild tender left >> right with vague diffuse swelling,.  Pharynx with mild erythema, no exudate Neck: Neck supple. Gross normal ROM Cardiovascular: Normal rate and regular rhythm.   Pulmonary/Chest: Effort normal and breath sounds without rales or wheezing.  Neurological: Pt is alert. At baseline orientation, motor grossly intact, cn 2-12 intact Skin: Skin is warm. No rashes, other new lesions, no LE edema Psychiatric: Pt behavior is normal without agitation  No other exam findings    Assessment & Plan:

## 2017-07-06 NOTE — Assessment & Plan Note (Signed)
[facility],  Lab Results  Component Value Date   HGBA1C 5.5 01/18/2017  stable overall by history and exam, recent data reviewed with pt, and pt to continue medical treatment as before,  to f/u any worsening symptoms or concerns

## 2017-07-10 ENCOUNTER — Ambulatory Visit: Payer: Medicare HMO | Admitting: Internal Medicine

## 2017-07-17 DIAGNOSIS — G500 Trigeminal neuralgia: Secondary | ICD-10-CM | POA: Diagnosis not present

## 2017-07-21 ENCOUNTER — Emergency Department (HOSPITAL_COMMUNITY)
Admission: EM | Admit: 2017-07-21 | Discharge: 2017-07-21 | Disposition: A | Discharge status: Home / Self Care | Payer: Medicare Other | Attending: Emergency Medicine | Admitting: Emergency Medicine

## 2017-07-21 ENCOUNTER — Encounter (HOSPITAL_COMMUNITY): Payer: Self-pay | Admitting: Cardiology

## 2017-07-21 DIAGNOSIS — Z87891 Personal history of nicotine dependence: Secondary | ICD-10-CM | POA: Diagnosis not present

## 2017-07-21 DIAGNOSIS — Z880 Allergy status to penicillin: Secondary | ICD-10-CM | POA: Insufficient documentation

## 2017-07-21 DIAGNOSIS — G500 Trigeminal neuralgia: Secondary | ICD-10-CM | POA: Diagnosis not present

## 2017-07-21 DIAGNOSIS — Z79899 Other long term (current) drug therapy: Secondary | ICD-10-CM | POA: Insufficient documentation

## 2017-07-21 DIAGNOSIS — E213 Hyperparathyroidism, unspecified: Secondary | ICD-10-CM | POA: Diagnosis not present

## 2017-07-21 DIAGNOSIS — I10 Essential (primary) hypertension: Secondary | ICD-10-CM | POA: Diagnosis not present

## 2017-07-21 DIAGNOSIS — Z7982 Long term (current) use of aspirin: Secondary | ICD-10-CM | POA: Insufficient documentation

## 2017-07-21 DIAGNOSIS — Z885 Allergy status to narcotic agent status: Secondary | ICD-10-CM | POA: Diagnosis not present

## 2017-07-21 DIAGNOSIS — G501 Atypical facial pain: Secondary | ICD-10-CM | POA: Diagnosis present

## 2017-07-21 LAB — CBG MONITORING, ED: Glucose-Capillary: 100 mg/dL — ABNORMAL HIGH (ref 65–99)

## 2017-07-21 MED ORDER — DEXAMETHASONE SODIUM PHOSPHATE 10 MG/ML IJ SOLN
10.0000 mg | Freq: Once | INTRAMUSCULAR | Status: AC
  Administered 2017-07-21: 10 mg via INTRAVENOUS
  Filled 2017-07-21: qty 1

## 2017-07-21 MED ORDER — PROMETHAZINE HCL 25 MG/ML IJ SOLN
12.5000 mg | Freq: Once | INTRAMUSCULAR | Status: AC
  Administered 2017-07-21: 12.5 mg via INTRAVENOUS

## 2017-07-21 MED ORDER — ATROPINE SULFATE 1 MG/ML IJ SOLN
INTRAMUSCULAR | Status: AC
  Filled 2017-07-21: qty 1

## 2017-07-21 MED ORDER — ONDANSETRON HCL 4 MG/2ML IJ SOLN
4.0000 mg | Freq: Once | INTRAMUSCULAR | Status: AC
Start: 2017-07-21 — End: 2017-07-21
  Administered 2017-07-21: 4 mg via INTRAVENOUS
  Filled 2017-07-21: qty 2

## 2017-07-21 MED ORDER — FENTANYL CITRATE (PF) 100 MCG/2ML IJ SOLN
75.0000 ug | INTRAMUSCULAR | Status: DC | PRN
  Administered 2017-07-21: 75 ug via INTRAVENOUS
  Filled 2017-07-21: qty 2

## 2017-07-21 MED ORDER — DEXAMETHASONE 4 MG PO TABS: 4.0000 mg | ORAL_TABLET | Freq: Two times a day (BID) | ORAL | 0 refills | Status: DC

## 2017-07-21 MED ORDER — PROMETHAZINE HCL 25 MG/ML IJ SOLN
INTRAMUSCULAR | Status: AC
  Filled 2017-07-21: qty 1

## 2017-07-21 MED ORDER — SODIUM CHLORIDE 0.9 % IV SOLN
1500.0000 mg | Freq: Once | INTRAVENOUS | Status: AC
  Administered 2017-07-21: 1500 mg via INTRAVENOUS
  Filled 2017-07-21: qty 30

## 2017-07-21 MED ORDER — SODIUM CHLORIDE 0.9 % IV BOLUS (SEPSIS)
1000.0000 mL | Freq: Once | INTRAVENOUS | Status: AC
  Administered 2017-07-21: 1000 mL via INTRAVENOUS

## 2017-07-21 MED ORDER — SODIUM CHLORIDE 0.9 % IV SOLN: 1500.0000 mg | Freq: Once | INTRAVENOUS | Status: DC

## 2017-07-21 NOTE — ED Notes (Signed)
Called to room by wife.  Pt difficult to arouse.  Pt on pulse ox and shows heart rate in the 30's.  Crash cart to bedside and placed on pads.  Dr. Jeneen Rinks called to room.

## 2017-07-21 NOTE — ED Triage Notes (Signed)
Pt states he has right face pain times 2 weeks.

## 2017-07-21 NOTE — ED Notes (Signed)
dilantin drip stopped at this time

## 2017-07-21 NOTE — ED Provider Notes (Signed)
[facility] DEPT Provider Note   CSN: [id number] Arrival date & time: 07/21/17  1027     History   Chief Complaint Chief Complaint  Patient presents with  . Facial Pain    HPI [name] is a 66 y.o. male. CC: Facial pain. History of trigeminal neuralgia.  HPI:  66 year old male. History of trigeminal neuralgia. Follows with neurosurgery and pain management. Pain management is Dr. Urbano Heir. Surgeon is Dr. Julaine Fusi. Has had previous microvascular decompression 6 or 7 years ago. Has had recurrences 3 and more frequently since January of this year.  Produce exacerbation for the last 2 weeks. Seen by his primary care physician and treated for possible sinusitis with amoxicillin and prednisone 2 weeks ago. Finished this treatment one week ago without improvement. Does not have sinus symptoms. Has intermittent episodes of lancinating right facial pain. He is compliant with, and takes Tegretol, and gabapentin.  He presented here stating that their pain management physician Dr. Lovenia Shuck told them that there was "an IV medicine that may help". He requests that I call Dr. Lovenia Shuck at his request.  Past Medical History:  Diagnosis Date  . Allergic rhinitis   . Anemia, pernicious   . Anxiety and depression    sees Dr. [name]  . Arthritis   . Catheter-associated urinary tract infection ([place])   . Diverticulosis of colon   . Elevated PSA    and hypogonadism, sees urologist  . Facial asymmetry   . GERD (gastroesophageal reflux disease)    hiatal hernia  . HTN (hypertension)   . Hx of blood transfusion reaction   . Hyperlipidemia   . Hyperparathyroidism ([place])   . Hyperplastic colon polyp   . Hypertension    acei causes cough  . IBS (irritable bowel syndrome)   . Nephrolithiasis   . Obstructive chronic bronchitis without exacerbation, followed by Dr. [name] 03/04/2009   ONO RA  Normal 07/28/11 6 min walk >537m no desat PFTs 08/08/11: DLCO 70% TLC 80%  FeV1 80%   Fef 25 75 56%  with significant improvement after BD   . Peritonitis ([place])    after surgery in 1999  . PONV (postoperative nausea and vomiting)   . Rectal fissure   . Trigeminal neuralgia    has facial asymetry  . Ventral hernia     Patient Active Problem List   Diagnosis Date Noted  . Acute sinusitis 07/06/2017  . Hyperglycemia 01/18/2017  . Encounter for well adult exam with abnormal findings 01/13/2016  . Absolute anemia 10/15/2015  . Cervical spondylosis, treated by Dr. Gladstone Lighter, [facility] 06/12/2013  . Ventral hernia 03/04/2013  . Anxiety and depression, followed by Dr. [name] in Psych 11/19/2012  . Hiatal hernia, followed by Dr. Hassell Done ([place]) and Dr. Olevia Perches (GI) 11/19/2012  . Other testicular hypofunction, followed by Dr. Karsten Ro in Urology 10/15/2012  . Trigeminal neuralgia, s/p NSU, followed by Dr. Vertell Limber 10/15/2012  . BPH (benign prostatic hyperplasia), followed by Dr. Karsten Ro 07/26/2011  . Rosacea 09/02/2010  . COLONIC POLYPS, HYPERPLASTIC, HX OF 12/13/2009  . Obstructive chronic bronchitis without exacerbation, followed by Dr. [name] 03/04/2009  . Allergic rhinitis 04/03/2008  . Essential hypertension 05/08/2007  . GERD followed by Dr. Olevia Perches 05/08/2007    Past Surgical History:  Procedure Laterality Date  . ABDOMINAL SURGERY     Multiple  . BRAIN SURGERY  March 2012   Trigeminal nerve  . CHOLECYSTECTOMY  1981  . HAND SURGERY  1973   Left  . HERNIA REPAIR    .  KNEE SURGERY  1997   Left  . LITHOTRIPSY     Right  . NISSEN FUNDOPLICATION  7353    complicated by gastric perforation, peritonitis, ventral nernia   . RHIZOTOMY Right 11/17/2014   Procedure: RHIZOTOMY TO RIGHT V2,V3;  Surgeon: Erline Levine, MD;  Location: [facility];  Service: Neurosurgery;  Laterality: Right;  right  . RHIZOTOMY Right 12/22/2014   Procedure: Right V2 and V3 trigeminal rhysolysis;  Surgeon: Erline Levine, MD;  Location: [facility];  Service: Neurosurgery;  Laterality: Right;  Right V2 and V3  trigeminal rhysolysis  . UPPER GASTROINTESTINAL ENDOSCOPY    . VENTRAL HERNIA REPAIR         Home Medications    Prior to Admission medications   Medication Sig Start Date End Date Taking? Authorizing Provider  acetaminophen (TYLENOL) 325 MG tablet Take 650 mg by mouth every 6 (six) hours as needed (pain).    [provider]  ALPRAZolam Duanne Moron) 1 MG tablet Take 1 mg by mouth at bedtime as needed for anxiety.     [provider]  amoxicillin (AMOXIL) 500 MG capsule Take 2 capsules (1,000 mg total) by mouth 2 (two) times daily. 07/06/17   [name], MD  aspirin EC 81 MG tablet Take 1 tablet (81 mg total) by mouth daily. 01/13/16   [name], MD  carbamazepine (CARBATROL) 100 MG 12 hr capsule Take 1 capsule (100 mg total) by mouth 2 (two) times daily. 11/29/16   Carlisle Cater, PA-C  cyanocobalamin (,VITAMIN B-12,) 1000 MCG/ML injection Inject 1 mL (1,000 mcg total) into the skin every 30 (thirty) days. 01/22/17   Mauri Pole, MD  dexamethasone (DECADRON) 4 MG tablet Take 1 tablet (4 mg total) by mouth 2 (two) times daily with a meal. 07/21/17   Tanna Furry, MD  gabapentin (NEURONTIN) 400 MG capsule Take 800 mg by mouth 3 (three) times daily.     [provider]  losartan-hydrochlorothiazide (HYZAAR) 50-12.5 MG tablet Take 1 tablet by mouth daily. 03/19/17   [name], MD  NEXIUM 40 MG capsule Take 1 capsule (40 mg total) by mouth daily. 01/17/16   [name], MD  NON FORMULARY TMLAE-all natural mens supplement    [provider]  polyethylene glycol powder (GLYCOLAX/MIRALAX) powder DISSOLVE 1 CAPFUL IN LIQUID EVERY DAY AS NEEDED FOR CONSTIPATION 02/18/14   Lucretia Kern, DO  predniSONE (DELTASONE) 10 MG tablet 3 tabs by mouth per day for 3 days,2tabs per day for 3 days,1tab per day for 3 days 07/06/17   [name], MD  SYRINGE-NEEDLE, DISP, 3 ML 22G X 1-1/2" 3 ML MISC 1 Syringe by Does not apply route every 30 (thirty) days. 01/22/17    Mauri Pole, MD    Family History Family History  Problem Relation Age of Onset  . Diabetes Mother   . Coronary artery disease Mother        CABG  . Diabetes Sister   . Diabetes Brother   . Hypertension Brother   . Clotting disorder Brother   . Colon cancer Neg Hx   . Esophageal cancer Neg Hx   . Stomach cancer Neg Hx     Social History Social History  Substance Use Topics  . Smoking status: Former Smoker    Packs/day: 1.00    Years: 20.00    Types: Cigarettes    Quit date: 11/06/1996  . Smokeless tobacco: Former Systems developer    Types: Loss adjuster, chartered  Quit date: 11/07/1983  . Alcohol use No     Allergies   Ace inhibitors; Augmentin [amoxicillin-pot clavulanate]; Codeine; and Sulfa antibiotics   Review of Systems Review of Systems  Constitutional: Negative for appetite change, chills, diaphoresis, fatigue and fever.  HENT: Negative for mouth sores, sore throat and trouble swallowing.        Facial pain  Eyes: Negative for visual disturbance.  Respiratory: Negative for cough, chest tightness, shortness of breath and wheezing.   Cardiovascular: Negative for chest pain.  Gastrointestinal: Negative for abdominal distention, abdominal pain, diarrhea, nausea and vomiting.  Endocrine: Negative for polydipsia, polyphagia and polyuria.  Genitourinary: Negative for dysuria, frequency and hematuria.  Musculoskeletal: Negative for gait problem.  Skin: Negative for color change, pallor and rash.  Neurological: Negative for dizziness, syncope, light-headedness and headaches.  Hematological: Does not bruise/bleed easily.  Psychiatric/Behavioral: Negative for behavioral problems and confusion.     Physical Exam Updated Vital Signs BP 139/87   Pulse 65   Temp 98.1 F (36.7 C) (Oral)   Resp 14   Ht 5\' 11"  (1.803 m)   Wt 106.6 kg (235 lb)   SpO2 100%   BMI 32.78 kg/m   Physical Exam  Constitutional: He is oriented to person, place, and time. He appears well-developed and  well-nourished. No distress.  HENT:  Head: Normocephalic.  Eyes: Pupils are equal, round, and reactive to light. Conjunctivae are normal. No scleral icterus.  Neck: Normal range of motion. Neck supple. No thyromegaly present.  Cardiovascular: Normal rate and regular rhythm.  Exam reveals no gallop and no friction rub.   No murmur heard. Pulmonary/Chest: Effort normal and breath sounds normal. No respiratory distress. He has no wheezes. He has no rales.  Abdominal: Soft. Bowel sounds are normal. He exhibits no distension. There is no tenderness. There is no rebound.  Musculoskeletal: Normal range of motion.  Neurological: He is alert and oriented to person, place, and time.  Skin: Skin is warm and dry. No rash noted.  Psychiatric: He has a normal mood and affect. His behavior is normal.     ED Treatments / Results  Labs (all labs ordered are listed, but only abnormal results are displayed) Labs Reviewed  CBG MONITORING, ED - Abnormal; Notable for the following:       Result Value   Glucose-Capillary 100 (*)    All other components within normal limits    EKG  EKG Interpretation None       Radiology No results found.  Procedures Procedures (including critical care time)  Medications Ordered in ED Medications  fentaNYL (SUBLIMAZE) injection 75 mcg (75 mcg Intravenous Given 07/21/17 1250)  atropine 1 MG/ML injection (not administered)  promethazine (PHENERGAN) 25 MG/ML injection (not administered)  dexamethasone (DECADRON) injection 10 mg (10 mg Intravenous Given 07/21/17 1250)  ondansetron (ZOFRAN) injection 4 mg (4 mg Intravenous Given 07/21/17 1250)  phenytoin (DILANTIN) 1,500 mg in sodium chloride 0.9 % 250 mL IVPB (0 mg Intravenous Stopped 07/21/17 1415)  sodium chloride 0.9 % bolus 1,000 mL (1,000 mLs Intravenous New Bag/Given 07/21/17 1337)  promethazine (PHENERGAN) injection 12.5 mg (12.5 mg Intravenous Given 07/21/17 1330)     Initial Impression / Assessment and  Plan / ED Course  I have reviewed the triage vital signs and the nursing notes.  Pertinent labs & imaging results that were available during my care of the patient were reviewed by me and considered in my medical decision making (see chart for details).  I discussed the case with Dr. Lovenia Shuck. He recommends 10-15 mg/kg of IV fosphenytoin. Per my discussion with pharmacy we do not have fosphenytoin available. The 20th recommended. 1500 mg ordered by myself. As this infusion started he developed some left ear pain and nausea became diaphoretic and bradycardic. He had just been given fentanyl and Decadron as well. This resolved. He had   temperature juncture bradycardia. This resolved with sinus rhythm with stable vitals and he became asymptomatic. His nausea had resolved. The right groin was restarted at a lower dose to a diluted line. He had same reaction and this was discontinued. Fortunately, after the Decadron, hestated he had significant relief of his symptoms. Plan will be to continue on simple oral Decadron. Also continue on his Tegretol, and gabapentin at current doses. Follow-up with Dr. Lovenia Shuck on Monday.    Final diagnoses:  Trigeminal neuralgia    New Prescriptions New Prescriptions   DEXAMETHASONE (DECADRON) 4 MG TABLET    Take 1 tablet (4 mg total) by mouth 2 (two) times daily with a meal.     Tanna Furry, MD 07/21/17 1517

## 2017-07-21 NOTE — Discharge Instructions (Signed)
Follow up with your pain management physician on Monday.

## 2017-07-21 NOTE — ED Notes (Signed)
Pt c/o of burning to rt arm after dilantin drip started.  Slowed flow down to 212ml per hour and hung bag of ns to run with med. Contacted pharmacist, she states the medication can burn, to slow rate and run saline with med.

## 2017-07-21 NOTE — ED Notes (Signed)
Pt states he feels the same way he did before his hr dropped earlier.  Dilantin stopped and edp notifed.

## 2017-07-21 NOTE — ED Notes (Signed)
Pt c/o lt ear pain.  States he has never had pain her.  edp notified.

## 2017-07-21 NOTE — ED Notes (Signed)
1410 pt dilantin restarted at 144ml/ hour

## 2017-07-27 ENCOUNTER — Emergency Department (HOSPITAL_COMMUNITY)
Admission: EM | Admit: 2017-07-27 | Discharge: 2017-07-28 | Disposition: A | Discharge status: Home / Self Care | Payer: Medicare Other | Attending: Emergency Medicine | Admitting: Emergency Medicine

## 2017-07-27 ENCOUNTER — Encounter (HOSPITAL_COMMUNITY): Payer: Self-pay | Admitting: Emergency Medicine

## 2017-07-27 DIAGNOSIS — G501 Atypical facial pain: Secondary | ICD-10-CM | POA: Diagnosis present

## 2017-07-27 DIAGNOSIS — Z880 Allergy status to penicillin: Secondary | ICD-10-CM | POA: Diagnosis not present

## 2017-07-27 DIAGNOSIS — Z885 Allergy status to narcotic agent status: Secondary | ICD-10-CM | POA: Diagnosis not present

## 2017-07-27 DIAGNOSIS — Z79899 Other long term (current) drug therapy: Secondary | ICD-10-CM | POA: Insufficient documentation

## 2017-07-27 DIAGNOSIS — G500 Trigeminal neuralgia: Secondary | ICD-10-CM

## 2017-07-27 DIAGNOSIS — Z87891 Personal history of nicotine dependence: Secondary | ICD-10-CM | POA: Insufficient documentation

## 2017-07-27 DIAGNOSIS — Z7982 Long term (current) use of aspirin: Secondary | ICD-10-CM | POA: Insufficient documentation

## 2017-07-27 DIAGNOSIS — I10 Essential (primary) hypertension: Secondary | ICD-10-CM | POA: Insufficient documentation

## 2017-07-27 NOTE — ED Triage Notes (Signed)
Hx of trigeminal neuralgia  Here for pain control

## 2017-07-27 NOTE — ED Provider Notes (Signed)
[facility] DEPT Provider Note   CSN: [id number] Arrival date & time: 07/27/17  2201     History   Chief Complaint Chief Complaint  Patient presents with  . Facial Pain    hx of trigeminal neur    HPI [name] is a 66 y.o. male.  The history is provided by the patient.  He has a history of trigeminal neuralgia-scheduled for surgery 5 days from today in [place]. Since this morning, he has had a flareup of his trigeminal neuralgia. He has been having sharp, shooting pains in the right side of his face. When present, the heart 10/10. There, and with no particular pattern. In the past, he has come to the ED for interventions.  Past Medical History:  Diagnosis Date  . Allergic rhinitis   . Anemia, pernicious   . Anxiety and depression    sees Dr. [name]  . Arthritis   . Catheter-associated urinary tract infection ([place])   . Diverticulosis of colon   . Elevated PSA    and hypogonadism, sees urologist  . Facial asymmetry   . GERD (gastroesophageal reflux disease)    hiatal hernia  . HTN (hypertension)   . Hx of blood transfusion reaction   . Hyperlipidemia   . Hyperparathyroidism ([place])   . Hyperplastic colon polyp   . Hypertension    acei causes cough  . IBS (irritable bowel syndrome)   . Nephrolithiasis   . Obstructive chronic bronchitis without exacerbation, followed by Dr. [name] 03/04/2009   ONO RA  Normal 07/28/11 6 min walk >558m no desat PFTs 08/08/11: DLCO 70% TLC 80%  FeV1 80%   Fef 25 75 56% with significant improvement after BD   . Peritonitis ([place])    after surgery in 1999  . PONV (postoperative nausea and vomiting)   . Rectal fissure   . Trigeminal neuralgia    has facial asymetry  . Ventral hernia     Patient Active Problem List   Diagnosis Date Noted  . Acute sinusitis 07/06/2017  . Hyperglycemia 01/18/2017  . Encounter for well adult exam with abnormal findings 01/13/2016  . Absolute anemia 10/15/2015  . Cervical spondylosis, treated  by Dr. Gladstone Lighter, [facility] 06/12/2013  . Ventral hernia 03/04/2013  . Anxiety and depression, followed by Dr. [name] in Psych 11/19/2012  . Hiatal hernia, followed by Dr. Hassell Done ([employer]) and Dr. Olevia Perches ([facility]) 11/19/2012  . Other testicular hypofunction, followed by Dr. Karsten Ro in Urology 10/15/2012  . Trigeminal neuralgia, s/p NSU, followed by Dr. Vertell Limber 10/15/2012  . BPH (benign prostatic hyperplasia), followed by Dr. Karsten Ro 07/26/2011  . Rosacea 09/02/2010  . COLONIC POLYPS, HYPERPLASTIC, HX OF 12/13/2009  . Obstructive chronic bronchitis without exacerbation, followed by Dr. [name] 03/04/2009  . Allergic rhinitis 04/03/2008  . Essential hypertension 05/08/2007  . GERD followed by Dr. Olevia Perches 05/08/2007    Past Surgical History:  Procedure Laterality Date  . ABDOMINAL SURGERY     Multiple  . BRAIN SURGERY  March 2012   Trigeminal nerve  . CHOLECYSTECTOMY  1981  . HAND SURGERY  1973   Left  . [place]   Left  . LITHOTRIPSY     Right  . NISSEN FUNDOPLICATION  3474    complicated by gastric perforation, peritonitis, ventral nernia   . RHIZOTOMY Right 11/17/2014   Procedure: RHIZOTOMY TO RIGHT V2,V3;  Surgeon: Erline Levine, MD;  Location: [facility];  Service: Neurosurgery;  Laterality: Right;  right  . RHIZOTOMY Right 12/22/2014   Procedure: Right V2 and V3 trigeminal rhysolysis;  Surgeon: Erline Levine, MD;  Location: [facility];  Service: Neurosurgery;  Laterality: Right;  Right V2 and V3 trigeminal rhysolysis  . UPPER GASTROINTESTINAL ENDOSCOPY    . VENTRAL HERNIA REPAIR         Home Medications    Prior to Admission medications   Medication Sig Start Date End Date Taking? Authorizing Provider  acetaminophen (TYLENOL) 325 MG tablet Take 650 mg by mouth every 6 (six) hours as needed (pain).    [provider]  ALPRAZolam Duanne Moron) 1 MG tablet Take 1 mg by mouth at bedtime as needed for anxiety.     [provider]    amoxicillin (AMOXIL) 500 MG capsule Take 2 capsules (1,000 mg total) by mouth 2 (two) times daily. 07/06/17   [name], MD  aspirin EC 81 MG tablet Take 1 tablet (81 mg total) by mouth daily. 01/13/16   [name], MD  carbamazepine (CARBATROL) 100 MG 12 hr capsule Take 1 capsule (100 mg total) by mouth 2 (two) times daily. 11/29/16   Carlisle Cater, PA-C  cyanocobalamin (,VITAMIN B-12,) 1000 MCG/ML injection Inject 1 mL (1,000 mcg total) into the skin every 30 (thirty) days. 01/22/17   Mauri Pole, MD  dexamethasone (DECADRON) 4 MG tablet Take 1 tablet (4 mg total) by mouth 2 (two) times daily with a meal. 07/21/17   Tanna Furry, MD  gabapentin (NEURONTIN) 400 MG capsule Take 800 mg by mouth 3 (three) times daily.     [provider]  losartan-hydrochlorothiazide (HYZAAR) 50-12.5 MG tablet Take 1 tablet by mouth daily. 03/19/17   [name], MD  NEXIUM 40 MG capsule Take 1 capsule (40 mg total) by mouth daily. 01/17/16   [name], MD  NON FORMULARY TMLAE-all natural mens supplement    [provider]  polyethylene glycol powder (GLYCOLAX/MIRALAX) powder DISSOLVE 1 CAPFUL IN LIQUID EVERY DAY AS NEEDED FOR CONSTIPATION 02/18/14   Lucretia Kern, DO  predniSONE (DELTASONE) 10 MG tablet 3 tabs by mouth per day for 3 days,2tabs per day for 3 days,1tab per day for 3 days 07/06/17   [name], MD  SYRINGE-NEEDLE, DISP, 3 ML 22G X 1-1/2" 3 ML MISC 1 Syringe by Does not apply route every 30 (thirty) days. 01/22/17   Mauri Pole, MD    Family History Family History  Problem Relation Age of Onset  . Diabetes Mother   . Coronary artery disease Mother        CABG  . Diabetes Sister   . Diabetes Brother   . Hypertension Brother   . Clotting disorder Brother   . Colon cancer Neg Hx   . Esophageal cancer Neg Hx   . Stomach cancer Neg Hx     Social History Social History  Substance Use Topics  . Smoking status: Former Smoker    Packs/day: 1.00    Years:  20.00    Types: Cigarettes    Quit date: 11/06/1996  . Smokeless tobacco: Former Systems developer    Types: Chew    Quit date: 11/07/1983  . Alcohol use No     Allergies   Dilantin [phenytoin]; Ace inhibitors; Augmentin [amoxicillin-pot clavulanate]; Codeine; and Sulfa antibiotics   Review of Systems Review of Systems  All other systems reviewed and are negative.    Physical Exam Updated Vital Signs BP (!) 146/90   Pulse 61  Temp 97.8 F (36.6 C) (Oral)   Resp 18   Ht 5\' 11"  (1.803 m)   Wt 106.6 kg (235 lb)   SpO2 98%   BMI 32.78 kg/m   Physical Exam  Nursing note and vitals reviewed.  66 year old male, resting comfortably and in no acute distress. Vital signs are significant for mild hypertension. Oxygen saturation is 98%, which is normal. Head is normocephalic and atraumatic. PERRLA, EOMI. Oropharynx is clear. Neck is nontender and supple without adenopathy or JVD. Back is nontender and there is no CVA tenderness. Lungs are clear without rales, wheezes, or rhonchi. Chest is nontender. Heart has regular rate and rhythm without murmur. Abdomen is soft, flat, nontender without masses or hepatosplenomegaly and peristalsis is normoactive. Extremities have no cyanosis or edema, full range of motion is present. Skin is warm and dry without rash. Neurologic: Mental status is normal, cranial nerves are intact, there are no motor or sensory deficits.  ED Treatments / Results   EKG  EKG Interpretation  Date/Time:  Saturday July 28 2017 00:24:16 EDT Ventricular Rate:  65 PR Interval:    QRS Duration: 113 QT Interval:  405 QTC Calculation: 422 R Axis:   66 Text Interpretation:  Sinus rhythm Incomplete right bundle branch block Baseline wander in lead(s) V2 V6 When compared with ECG of 07/21/2017, Normal sinus rhythm has replaced Junctional bradycardia When compared with ECG of 11/11/2014, No significant change was found Confirmed by Delora Fuel ([id number]) on 07/28/2017 12:32:16 AM        Procedures Procedures (including critical care time)  Medications Ordered in ED Medications - No data to display   Initial Impression / Assessment and Plan / ED Course  I have reviewed the triage vital signs and the nursing notes.  Pertinent labs & imaging results that were available during my care of the patient were reviewed by me and considered in my medical decision making (see chart for details).  Exacerbation of trigeminal neuralgia. Old records are reviewed, and he had been seen one week ago with similar complaints. He was treated with phenytoin 1500 mg as well as Tylenol and dexamethasone. He developed a junctional bradycardia with with this which resolved. At this point, I suspect that the problem was related to both dose and rate of infusion. He will be given phenytoin 1000 mg with infusion rate to about 10 mg/minute. He will be monitored carefully during this infusion.  He had good relief of symptoms with above noted treatment. There was no arrhythmia. He is discharged with instructions to follow-up with his surgeon for surgery scheduled on September 26. Return if symptoms worsen.  Final Clinical Impressions(s) / ED Diagnoses   Final diagnoses:  Trigeminal neuralgia of right side of face    New Prescriptions New Prescriptions   No medications on file     Delora Fuel, MD 50/93/26 [phone]

## 2017-07-28 DIAGNOSIS — G500 Trigeminal neuralgia: Secondary | ICD-10-CM | POA: Diagnosis not present

## 2017-07-28 MED ORDER — FENTANYL CITRATE (PF) 100 MCG/2ML IJ SOLN
50.0000 ug | Freq: Once | INTRAMUSCULAR | Status: AC
  Administered 2017-07-28: 50 ug via INTRAVENOUS
  Filled 2017-07-28: qty 2

## 2017-07-28 MED ORDER — SODIUM CHLORIDE 0.9 % IV SOLN
1000.0000 mg | Freq: Once | INTRAVENOUS | Status: AC
  Administered 2017-07-28: 1000 mg via INTRAVENOUS
  Filled 2017-07-28: qty 20

## 2017-07-28 MED ORDER — PHENYTOIN SODIUM 50 MG/ML IJ SOLN
INTRAMUSCULAR | Status: AC
  Filled 2017-07-28: qty 20

## 2017-07-28 MED ORDER — DEXAMETHASONE SODIUM PHOSPHATE 10 MG/ML IJ SOLN
10.0000 mg | Freq: Once | INTRAMUSCULAR | Status: AC
  Administered 2017-07-28: 10 mg via INTRAVENOUS
  Filled 2017-07-28: qty 1

## 2017-07-28 NOTE — ED Notes (Signed)
ED Provider at bedside. 

## 2017-07-28 NOTE — Discharge Instructions (Signed)
Keep your appointment for the surgery on 9/26.

## 2017-08-01 DIAGNOSIS — G500 Trigeminal neuralgia: Secondary | ICD-10-CM | POA: Diagnosis not present

## 2017-08-06 HISTORY — PX: RHIZOTOMY W/ RADIOFREQUENCY ABLATION: SHX2356

## 2017-08-10 DIAGNOSIS — Z23 Encounter for immunization: Secondary | ICD-10-CM | POA: Diagnosis not present

## 2017-08-21 DIAGNOSIS — M278 Other specified diseases of jaws: Secondary | ICD-10-CM | POA: Insufficient documentation

## 2017-08-21 DIAGNOSIS — G500 Trigeminal neuralgia: Secondary | ICD-10-CM | POA: Diagnosis not present

## 2017-08-21 DIAGNOSIS — M6281 Muscle weakness (generalized): Secondary | ICD-10-CM | POA: Diagnosis not present

## 2017-08-21 DIAGNOSIS — R030 Elevated blood-pressure reading, without diagnosis of hypertension: Secondary | ICD-10-CM | POA: Diagnosis not present

## 2017-08-21 DIAGNOSIS — Z6833 Body mass index (BMI) 33.0-33.9, adult: Secondary | ICD-10-CM | POA: Diagnosis not present

## 2017-08-28 DIAGNOSIS — M6281 Muscle weakness (generalized): Secondary | ICD-10-CM | POA: Diagnosis not present

## 2017-08-28 DIAGNOSIS — G500 Trigeminal neuralgia: Secondary | ICD-10-CM | POA: Diagnosis not present

## 2017-09-08 ENCOUNTER — Encounter (HOSPITAL_COMMUNITY): Payer: Self-pay | Admitting: *Deleted

## 2017-09-08 ENCOUNTER — Emergency Department (HOSPITAL_COMMUNITY)
Admission: EM | Admit: 2017-09-08 | Discharge: 2017-09-09 | Disposition: A | Discharge status: Home / Self Care | Payer: Medicare Other | Attending: Emergency Medicine | Admitting: Emergency Medicine

## 2017-09-08 ENCOUNTER — Emergency Department (HOSPITAL_COMMUNITY): Payer: Medicare Other

## 2017-09-08 DIAGNOSIS — I10 Essential (primary) hypertension: Secondary | ICD-10-CM | POA: Diagnosis not present

## 2017-09-08 DIAGNOSIS — K529 Noninfective gastroenteritis and colitis, unspecified: Secondary | ICD-10-CM | POA: Diagnosis not present

## 2017-09-08 DIAGNOSIS — Z87891 Personal history of nicotine dependence: Secondary | ICD-10-CM | POA: Insufficient documentation

## 2017-09-08 DIAGNOSIS — Z79899 Other long term (current) drug therapy: Secondary | ICD-10-CM | POA: Diagnosis not present

## 2017-09-08 DIAGNOSIS — Z7982 Long term (current) use of aspirin: Secondary | ICD-10-CM | POA: Diagnosis not present

## 2017-09-08 DIAGNOSIS — J189 Pneumonia, unspecified organism: Secondary | ICD-10-CM

## 2017-09-08 DIAGNOSIS — R140 Abdominal distension (gaseous): Secondary | ICD-10-CM | POA: Diagnosis not present

## 2017-09-08 DIAGNOSIS — K5730 Diverticulosis of large intestine without perforation or abscess without bleeding: Secondary | ICD-10-CM | POA: Diagnosis not present

## 2017-09-08 DIAGNOSIS — R1084 Generalized abdominal pain: Secondary | ICD-10-CM | POA: Diagnosis present

## 2017-09-08 LAB — COMPREHENSIVE METABOLIC PANEL
ALK PHOS: 74 U/L (ref 38–126)
ALT: 18 U/L (ref 17–63)
AST: 23 U/L (ref 15–41)
Albumin: 4.2 g/dL (ref 3.5–5.0)
Anion gap: 11 (ref 5–15)
BILIRUBIN TOTAL: 0.9 mg/dL (ref 0.3–1.2)
BUN: 12 mg/dL (ref 6–20)
CALCIUM: 9.3 mg/dL (ref 8.9–10.3)
CO2: 30 mmol/L (ref 22–32)
Chloride: 97 mmol/L — ABNORMAL LOW (ref 101–111)
Creatinine, Ser: 1.12 mg/dL (ref 0.61–1.24)
GFR calc Af Amer: >60 mL/min (ref >60)
Glucose, Bld: 138 mg/dL — ABNORMAL HIGH (ref 65–99)
POTASSIUM: 4 mmol/L (ref 3.5–5.1)
Sodium: 138 mmol/L (ref 135–145)
TOTAL PROTEIN: 7.4 g/dL (ref 6.5–8.1)

## 2017-09-08 LAB — CBC
HEMATOCRIT: 50.1 % (ref 39.0–52.0)
Hemoglobin: 17.8 g/dL — ABNORMAL HIGH (ref 13.0–17.0)
MCH: 31.6 pg (ref 26.0–34.0)
MCHC: 35.5 g/dL (ref 30.0–36.0)
MCV: 89 fL (ref 78.0–100.0)
PLATELETS: 176 10*3/uL (ref 150–400)
RBC: 5.63 MIL/uL (ref 4.22–5.81)
RDW: 12.9 % (ref 11.5–15.5)
WBC: 16.8 10*3/uL — AB (ref 4.0–10.5)

## 2017-09-08 LAB — LIPASE, BLOOD: Lipase: 22 U/L (ref 11–51)

## 2017-09-08 MED ORDER — ONDANSETRON 4 MG PO TBDP
4.0000 mg | ORAL_TABLET | Freq: Once | ORAL | Status: AC | PRN
  Administered 2017-09-08: 4 mg via ORAL
  Filled 2017-09-08: qty 1

## 2017-09-08 MED ORDER — IOPAMIDOL (ISOVUE-300) INJECTION 61%
100.0000 mL | Freq: Once | INTRAVENOUS | Status: AC | PRN
Start: 2017-09-08 — End: 2017-09-08
  Administered 2017-09-08: 100 mL via INTRAVENOUS

## 2017-09-08 MED ORDER — SODIUM CHLORIDE 0.9 % IV BOLUS (SEPSIS)
1000.0000 mL | Freq: Once | INTRAVENOUS | Status: AC
  Administered 2017-09-08: 1000 mL via INTRAVENOUS

## 2017-09-08 MED ORDER — SODIUM CHLORIDE 0.9 % IV SOLN
INTRAVENOUS | Status: DC
  Administered 2017-09-08: 22:00:00 via INTRAVENOUS

## 2017-09-08 MED ORDER — ONDANSETRON HCL 4 MG/2ML IJ SOLN
4.0000 mg | Freq: Once | INTRAMUSCULAR | Status: AC
  Administered 2017-09-08: 4 mg via INTRAVENOUS
  Filled 2017-09-08: qty 2

## 2017-09-08 MED ORDER — HYDROMORPHONE HCL 1 MG/ML IJ SOLN
1.0000 mg | Freq: Once | INTRAMUSCULAR | Status: AC
  Administered 2017-09-08: 1 mg via INTRAVENOUS
  Filled 2017-09-08: qty 1

## 2017-09-08 NOTE — ED Provider Notes (Signed)
[facility] EMERGENCY DEPARTMENT Provider Note   CSN: [id number] Arrival date & time: 09/08/17  2047     History   Chief Complaint Chief Complaint  Patient presents with  . Emesis    HPI [name] is a 66 y.o. male.  The history is provided by the patient.  Abdominal Pain   The current episode started yesterday. The problem occurs constantly. The problem has been gradually worsening. The pain is located in the generalized abdominal region. The pain is moderate. Associated symptoms include nausea and vomiting. Pertinent negatives include fever and diarrhea. The symptoms are aggravated by eating. Nothing relieves the symptoms. His past medical history is significant for irritable bowel syndrome. Past medical history comments: hiatal hernia.    Past Medical History:  Diagnosis Date  . Allergic rhinitis   . Anemia, pernicious   . Anxiety and depression    sees Dr. [name]  . Arthritis   . Catheter-associated urinary tract infection ([place])   . Diverticulosis of colon   . Elevated PSA    and hypogonadism, sees urologist  . Facial asymmetry   . GERD (gastroesophageal reflux disease)    hiatal hernia  . HTN (hypertension)   . Hx of blood transfusion reaction   . Hyperlipidemia   . Hyperparathyroidism ([place])   . Hyperplastic colon polyp   . Hypertension    acei causes cough  . IBS (irritable bowel syndrome)   . Nephrolithiasis   . Obstructive chronic bronchitis without exacerbation, followed by Dr. [name] 03/04/2009   ONO RA  Normal 07/28/11 6 min walk >548m no desat PFTs 08/08/11: DLCO 70% TLC 80%  FeV1 80%   Fef 25 75 56% with significant improvement after BD   . Peritonitis ([place])    after surgery in 1999  . PONV (postoperative nausea and vomiting)   . Rectal fissure   . Trigeminal neuralgia    has facial asymetry  . Ventral hernia     Patient Active Problem List   Diagnosis Date Noted  . Acute sinusitis 07/06/2017  . Hyperglycemia 01/18/2017  . Encounter for  well adult exam with abnormal findings 01/13/2016  . Absolute anemia 10/15/2015  . Cervical spondylosis, treated by Dr. Gladstone Lighter, [facility] 06/12/2013  . Ventral hernia 03/04/2013  . Anxiety and depression, followed by Dr. [name] in Psych 11/19/2012  . Hiatal hernia, followed by Dr. Hassell Done ([facility]) and Dr. Olevia Perches (GI) 11/19/2012  . Other testicular hypofunction, followed by Dr. Karsten Ro in Urology 10/15/2012  . Trigeminal neuralgia, s/p NSU, followed by Dr. Vertell Limber 10/15/2012  . BPH (benign prostatic hyperplasia), followed by Dr. Karsten Ro 07/26/2011  . Rosacea 09/02/2010  . COLONIC POLYPS, HYPERPLASTIC, HX OF 12/13/2009  . Obstructive chronic bronchitis without exacerbation, followed by Dr. [name] 03/04/2009  . Allergic rhinitis 04/03/2008  . Essential hypertension 05/08/2007  . GERD followed by Dr. Olevia Perches 05/08/2007    Past Surgical History:  Procedure Laterality Date  . ABDOMINAL SURGERY     Multiple  . BRAIN SURGERY  March 2012   Trigeminal nerve  . CHOLECYSTECTOMY  1981  . HAND SURGERY  1973   Left  . [place]   Left  . LITHOTRIPSY     Right  . NISSEN FUNDOPLICATION  2878    complicated by gastric perforation, peritonitis, ventral nernia   . RHIZOTOMY Right 11/17/2014   Procedure: RHIZOTOMY TO RIGHT V2,V3;  Surgeon: Erline Levine, MD;  Location: [place] [facility]URO ORS;  Service: Neurosurgery;  Laterality: Right;  right  . RHIZOTOMY Right 12/22/2014   Procedure: Right V2 and V3 trigeminal rhysolysis;  Surgeon: Erline Levine, MD;  Location: [place] NEURO ORS;  Service: Neurosurgery;  Laterality: Right;  Right V2 and V3 trigeminal rhysolysis  . UPPER GASTROINTESTINAL ENDOSCOPY    . VENTRAL HERNIA REPAIR         Home Medications    Prior to Admission medications   Medication Sig Start Date End Date Taking? Authorizing Provider  acetaminophen (TYLENOL) 325 MG tablet Take 650 mg by mouth every 6 (six) hours as needed (pain).    [provider]    ALPRAZolam Duanne Moron) 1 MG tablet Take 1 mg by mouth at bedtime as needed for anxiety.     [provider]  amoxicillin (AMOXIL) 500 MG capsule Take 2 capsules (1,000 mg total) by mouth 2 (two) times daily. 07/06/17   [name], MD  aspirin EC 81 MG tablet Take 1 tablet (81 mg total) by mouth daily. 01/13/16   [name], MD  carbamazepine (CARBATROL) 100 MG 12 hr capsule Take 1 capsule (100 mg total) by mouth 2 (two) times daily. 11/29/16   Carlisle Cater, PA-C  cyanocobalamin (,VITAMIN B-12,) 1000 MCG/ML injection Inject 1 mL (1,000 mcg total) into the skin every 30 (thirty) days. 01/22/17   Mauri Pole, MD  dexamethasone (DECADRON) 4 MG tablet Take 1 tablet (4 mg total) by mouth 2 (two) times daily with a meal. 07/21/17   Tanna Furry, MD  gabapentin (NEURONTIN) 400 MG capsule Take 800 mg by mouth 3 (three) times daily.     [provider]  losartan-hydrochlorothiazide (HYZAAR) 50-12.5 MG tablet Take 1 tablet by mouth daily. 03/19/17   [name], MD  NEXIUM 40 MG capsule Take 1 capsule (40 mg total) by mouth daily. 01/17/16   [name], MD  NON FORMULARY TMLAE-all natural mens supplement    [provider]  polyethylene glycol powder (GLYCOLAX/MIRALAX) powder DISSOLVE 1 CAPFUL IN LIQUID EVERY DAY AS NEEDED FOR CONSTIPATION 02/18/14   Lucretia Kern, DO  predniSONE (DELTASONE) 10 MG tablet 3 tabs by mouth per day for 3 days,2tabs per day for 3 days,1tab per day for 3 days 07/06/17   [name], MD  SYRINGE-NEEDLE, DISP, 3 ML 22G X 1-1/2" 3 ML MISC 1 Syringe by Does not apply route every 30 (thirty) days. 01/22/17   Mauri Pole, MD    Family History Family History  Problem Relation Age of Onset  . Diabetes Mother   . Coronary artery disease Mother        CABG  . Diabetes Sister   . Diabetes Brother   . Hypertension Brother   . Clotting disorder Brother   . Colon cancer Neg Hx   . Esophageal cancer Neg Hx   . Stomach cancer Neg Hx      Social History Social History  Substance Use Topics  . Smoking status: Former Smoker    Packs/day: 1.00    Years: 20.00    Types: Cigarettes    Quit date: 11/06/1996  . Smokeless tobacco: Former Systems developer    Types: Chew    Quit date: 11/07/1983  . Alcohol use No     Allergies   Dilantin [phenytoin]; Ace inhibitors; Augmentin [amoxicillin-pot clavulanate]; Codeine; and Sulfa antibiotics   Review of Systems Review of Systems  Constitutional: Negative for fever.  Gastrointestinal: Positive for abdominal pain, nausea and vomiting. Negative for diarrhea.  All other systems  reviewed and are negative.    Physical Exam Updated Vital Signs BP 123/73 (BP Location: Left Arm)   Pulse (!) 101   Temp 98.6 F (37 C) (Oral)   Resp 18   Ht 1.803 m (5\' 11" )   Wt 106.6 kg (235 lb)   SpO2 98%   BMI 32.78 kg/m   Physical Exam  Constitutional: He appears well-developed and well-nourished. No distress.  HENT:  Head: Normocephalic and atraumatic.  Right Ear: External ear normal.  Left Ear: External ear normal.  Eyes: Conjunctivae are normal. Right eye exhibits no discharge. Left eye exhibits no discharge. No scleral icterus.  Neck: Neck supple. No tracheal deviation present.  Cardiovascular: Normal rate, regular rhythm and intact distal pulses.   Pulmonary/Chest: Effort normal and breath sounds normal. No stridor. No respiratory distress. He has no wheezes. He has no rales.  Abdominal: Soft. Bowel sounds are normal. He exhibits no distension. There is tenderness in the right upper quadrant, epigastric area and left upper quadrant. There is no rebound and no guarding.  Musculoskeletal: He exhibits no edema or tenderness.  Neurological: He is alert. He has normal strength. No cranial nerve deficit (no facial droop, extraocular movements intact, no slurred speech) or sensory deficit. He exhibits normal muscle tone. He displays no seizure activity. Coordination normal.  Skin: Skin is warm and  dry. No rash noted.  Psychiatric: He has a normal mood and affect.  Nursing note and vitals reviewed.    ED Treatments / Results  Labs (all labs ordered are listed, but only abnormal results are displayed) Labs Reviewed  COMPREHENSIVE METABOLIC PANEL - Abnormal; Notable for the following:       Result Value   Chloride 97 (*)    Glucose, Bld 138 (*)    All other components within normal limits  CBC - Abnormal; Notable for the following:    WBC 16.8 (*)    Hemoglobin 17.8 (*)    All other components within normal limits  LIPASE, BLOOD  URINALYSIS, ROUTINE W REFLEX MICROSCOPIC     Radiology Dg Abd Acute W/chest  Result Date: 09/08/2017 CLINICAL DATA:  Initial evaluation for acute nausea, vomiting. EXAM: DG ABDOMEN ACUTE W/ 1V CHEST COMPARISON:  Prior CT from 02/19/2013. FINDINGS: Mild cardiomegaly.  Mediastinal silhouette normal. Lungs hypoinflated. Associated bibasilar atelectasis. No focal infiltrates. No pulmonary edema or pleural effusion. No pneumothorax. Bowel gas pattern within normal limits without evidence for obstruction or ileus. No air-fluid levels on upright projection. No free air. No soft tissue mass or abnormal calcifications. Sequelae of prior cholecystectomy. Multiple surgical clips overlie the left upper quadrant. Hernia tacks present within left lower quadrant. No acute osseus abnormality. Degenerative changes about the hips bilaterally and within the lower lumbar spine. IMPRESSION: 1. Nonobstructive bowel gas pattern with no radiographic evidence for acute intra-abdominal pathology. 2. Shallow lung inflation with associated bibasilar atelectasis. No other active cardiopulmonary disease. Electronically Signed   By: Jeannine Boga M.D.   On: 09/08/2017 22:43    Procedures Procedures (including critical care time)  Medications Ordered in ED Medications  sodium chloride 0.9 % bolus 1,000 mL (0 mLs Intravenous Stopped 09/08/17 2323)    And  0.9 %  sodium  chloride infusion ( Intravenous New Bag/Given 09/08/17 2219)  ondansetron (ZOFRAN-ODT) disintegrating tablet 4 mg (4 mg Oral Given 09/08/17 2119)  ondansetron (ZOFRAN) injection 4 mg (4 mg Intravenous Given 09/08/17 2146)  HYDROmorphone (DILAUDID) injection 1 mg (1 mg Intravenous Given 09/08/17 2146)  iopamidol (ISOVUE-300) 61 %  injection 100 mL (100 mLs Intravenous Contrast Given 09/08/17 2345)     Initial Impression / Assessment and Plan / ED Course  I have reviewed the triage vital signs and the nursing notes.  Pertinent labs & imaging results that were available during my care of the patient were reviewed by me and considered in my medical decision making (see chart for details).   Pt is feeling better after IV fluids and pain meds.  WBC significantly elevated.  Will ct to evaluate further, ie diverticulitis, colitis, obstruction etc.  Will turn over to oncoming MD, pending CT scan results.  If negative, anticipate dc home Final Clinical Impressions(s) / ED Diagnoses  pending     Dorie Rank, MD 09/09/17 0005

## 2017-09-08 NOTE — ED Notes (Signed)
Pt with N/V x 4 today having been sick "all night" per spouse  He was eating crackers and drinking ginger ale   Felt better and wife gave him a soft scrambled egg  Then he vomited again and came to be evaluated

## 2017-09-08 NOTE — ED Triage Notes (Signed)
Pt with emesis since last night, abd cramping.  Denies diarrhea.

## 2017-09-08 NOTE — ED Notes (Signed)
Dr.Knapp at bedside  

## 2017-09-09 DIAGNOSIS — M1990 Unspecified osteoarthritis, unspecified site: Secondary | ICD-10-CM | POA: Diagnosis present

## 2017-09-09 DIAGNOSIS — I10 Essential (primary) hypertension: Secondary | ICD-10-CM | POA: Diagnosis present

## 2017-09-09 DIAGNOSIS — E861 Hypovolemia: Secondary | ICD-10-CM | POA: Diagnosis present

## 2017-09-09 DIAGNOSIS — E291 Testicular hypofunction: Secondary | ICD-10-CM | POA: Diagnosis present

## 2017-09-09 DIAGNOSIS — E871 Hypo-osmolality and hyponatremia: Secondary | ICD-10-CM | POA: Diagnosis present

## 2017-09-09 DIAGNOSIS — Z23 Encounter for immunization: Secondary | ICD-10-CM

## 2017-09-09 DIAGNOSIS — Z8249 Family history of ischemic heart disease and other diseases of the circulatory system: Secondary | ICD-10-CM

## 2017-09-09 DIAGNOSIS — J189 Pneumonia, unspecified organism: Principal | ICD-10-CM | POA: Diagnosis present

## 2017-09-09 DIAGNOSIS — F419 Anxiety disorder, unspecified: Secondary | ICD-10-CM | POA: Diagnosis present

## 2017-09-09 DIAGNOSIS — E213 Hyperparathyroidism, unspecified: Secondary | ICD-10-CM | POA: Diagnosis present

## 2017-09-09 DIAGNOSIS — Z882 Allergy status to sulfonamides status: Secondary | ICD-10-CM

## 2017-09-09 DIAGNOSIS — Z880 Allergy status to penicillin: Secondary | ICD-10-CM

## 2017-09-09 DIAGNOSIS — Z888 Allergy status to other drugs, medicaments and biological substances status: Secondary | ICD-10-CM

## 2017-09-09 DIAGNOSIS — Z79899 Other long term (current) drug therapy: Secondary | ICD-10-CM

## 2017-09-09 DIAGNOSIS — E872 Acidosis: Secondary | ICD-10-CM | POA: Diagnosis present

## 2017-09-09 DIAGNOSIS — K529 Noninfective gastroenteritis and colitis, unspecified: Secondary | ICD-10-CM | POA: Diagnosis present

## 2017-09-09 DIAGNOSIS — Z7982 Long term (current) use of aspirin: Secondary | ICD-10-CM

## 2017-09-09 DIAGNOSIS — K469 Unspecified abdominal hernia without obstruction or gangrene: Secondary | ICD-10-CM | POA: Diagnosis present

## 2017-09-09 DIAGNOSIS — J440 Chronic obstructive pulmonary disease with acute lower respiratory infection: Secondary | ICD-10-CM | POA: Diagnosis present

## 2017-09-09 DIAGNOSIS — K219 Gastro-esophageal reflux disease without esophagitis: Secondary | ICD-10-CM | POA: Diagnosis present

## 2017-09-09 DIAGNOSIS — Z885 Allergy status to narcotic agent status: Secondary | ICD-10-CM

## 2017-09-09 DIAGNOSIS — Z87442 Personal history of urinary calculi: Secondary | ICD-10-CM

## 2017-09-09 DIAGNOSIS — Z86010 Personal history of colonic polyps: Secondary | ICD-10-CM

## 2017-09-09 DIAGNOSIS — I4510 Unspecified right bundle-branch block: Secondary | ICD-10-CM | POA: Diagnosis present

## 2017-09-09 DIAGNOSIS — E785 Hyperlipidemia, unspecified: Secondary | ICD-10-CM | POA: Diagnosis present

## 2017-09-09 DIAGNOSIS — G500 Trigeminal neuralgia: Secondary | ICD-10-CM | POA: Diagnosis present

## 2017-09-09 DIAGNOSIS — D510 Vitamin B12 deficiency anemia due to intrinsic factor deficiency: Secondary | ICD-10-CM | POA: Diagnosis present

## 2017-09-09 DIAGNOSIS — Z9049 Acquired absence of other specified parts of digestive tract: Secondary | ICD-10-CM

## 2017-09-09 DIAGNOSIS — Z87891 Personal history of nicotine dependence: Secondary | ICD-10-CM

## 2017-09-09 DIAGNOSIS — F329 Major depressive disorder, single episode, unspecified: Secondary | ICD-10-CM | POA: Diagnosis present

## 2017-09-09 LAB — URINALYSIS, ROUTINE W REFLEX MICROSCOPIC
Bilirubin Urine: NEGATIVE
GLUCOSE, UA: NEGATIVE mg/dL
Hgb urine dipstick: NEGATIVE
Ketones, ur: NEGATIVE mg/dL
NITRITE: NEGATIVE
PH: 6 (ref 5.0–8.0)
Protein, ur: 30 mg/dL — AB
SPECIFIC GRAVITY, URINE: 1.033 — AB (ref 1.005–1.030)
Squamous Epithelial / LPF: NONE SEEN

## 2017-09-09 MED ORDER — ONDANSETRON HCL 4 MG PO TABS: 4.0000 mg | ORAL_TABLET | Freq: Three times a day (TID) | ORAL | 0 refills | Status: DC | PRN

## 2017-09-09 MED ORDER — ONDANSETRON HCL 4 MG PO TABS: 4.0000 mg | ORAL_TABLET | Freq: Four times a day (QID) | ORAL | 0 refills | Status: DC | PRN

## 2017-09-09 MED ORDER — DOXYCYCLINE HYCLATE 100 MG PO TABS
100.0000 mg | ORAL_TABLET | Freq: Once | ORAL | Status: AC
  Administered 2017-09-09: 100 mg via ORAL
  Filled 2017-09-09: qty 1

## 2017-09-09 MED ORDER — DOXYCYCLINE HYCLATE 100 MG PO CAPS: 100.0000 mg | ORAL_CAPSULE | Freq: Two times a day (BID) | ORAL | 0 refills | Status: DC

## 2017-09-09 NOTE — Discharge Instructions (Signed)
Return if symptoms are getting worse. °

## 2017-09-09 NOTE — ED Provider Notes (Signed)
Patient who presented with abdominal pain and vomiting, had CT scan because of elevated WBC.  CT showed evidence of enteritis, also thickened bladder wall worrisome for possible urinary infection, and groundglass densities in the lung worrisome for pneumonia.  Urinalysis obtained showed no evidence of infection.  However, based on CT findings in the lung, he will be treated for possible pneumonia.  Infection in both lungs suggests atypical pneumonia, so he is given a prescription for doxycycline.  Also given prescription for ondansetron for nausea.  Return precautions discussed.  Follow-up with PCP after completion of course of antibiotics.  Results for orders placed or performed during the hospital encounter of 09/08/17  Lipase, blood  Result Value Ref Range   Lipase 22 11 - 51 U/L  Comprehensive metabolic panel  Result Value Ref Range   Sodium 138 135 - 145 mmol/L   Potassium 4.0 3.5 - 5.1 mmol/L   Chloride 97 (L) 101 - 111 mmol/L   CO2 30 22 - 32 mmol/L   Glucose, Bld 138 (H) 65 - 99 mg/dL   BUN 12 6 - 20 mg/dL   Creatinine, Ser 1.12 0.61 - 1.24 mg/dL   Calcium 9.3 8.9 - 10.3 mg/dL   Total Protein 7.4 6.5 - 8.1 g/dL   Albumin 4.2 3.5 - 5.0 g/dL   AST 23 15 - 41 U/L   ALT 18 17 - 63 U/L   Alkaline Phosphatase 74 38 - 126 U/L   Total Bilirubin 0.9 0.3 - 1.2 mg/dL   GFR calc non Af Amer >60 >60 mL/min   GFR calc Af Amer >60 >60 mL/min   Anion gap 11 5 - 15  CBC  Result Value Ref Range   WBC 16.8 (H) 4.0 - 10.5 K/uL   RBC 5.63 4.22 - 5.81 MIL/uL   Hemoglobin 17.8 (H) 13.0 - 17.0 g/dL   HCT 50.1 39.0 - 52.0 %   MCV 89.0 78.0 - 100.0 fL   MCH 31.6 26.0 - 34.0 pg   MCHC 35.5 30.0 - 36.0 g/dL   RDW 12.9 11.5 - 15.5 %   Platelets 176 150 - 400 K/uL  Urinalysis, Routine w reflex microscopic  Result Value Ref Range   Color, Urine YELLOW YELLOW   APPearance CLOUDY (A) CLEAR   Specific Gravity, Urine 1.033 (H) 1.005 - 1.030   pH 6.0 5.0 - 8.0   Glucose, UA NEGATIVE NEGATIVE mg/dL    Hgb urine dipstick NEGATIVE NEGATIVE   Bilirubin Urine NEGATIVE NEGATIVE   Ketones, ur NEGATIVE NEGATIVE mg/dL   Protein, ur 30 (A) NEGATIVE mg/dL   Nitrite NEGATIVE NEGATIVE   Leukocytes, UA TRACE (A) NEGATIVE   RBC / HPF 0-5 0 - 5 RBC/hpf   WBC, UA 0-5 0 - 5 WBC/hpf   Bacteria, UA RARE (A) NONE SEEN   Squamous Epithelial / LPF NONE SEEN NONE SEEN   Mucus PRESENT    Hyaline Casts, UA PRESENT    Amorphous Crystal PRESENT    Ct Abdomen Pelvis W Contrast  Result Date: 09/09/2017 CLINICAL DATA:  Abdominal pain diverticulitis suspected. EXAM: CT ABDOMEN AND PELVIS WITH CONTRAST TECHNIQUE: Multidetector CT imaging of the abdomen and pelvis was performed using the standard protocol following bolus administration of intravenous contrast. CONTRAST:  119mL ISOVUE-300 IOPAMIDOL (ISOVUE-300) INJECTION 61% COMPARISON:  Radiographs earlier this day.  CT 05/11/2015 FINDINGS: Lower chest: Ground-glass and peribronchovascular opacities in the lingula, right middle lobes, right lower and predominantly in the left lower lobe. No pleural fluid. Hepatobiliary: No focal  hepatic lesion. Clips in the gallbladder fossa postcholecystectomy. No biliary dilatation. Pancreas: Parenchymal atrophy. No ductal dilatation or inflammation. Spleen: Normal in size without focal abnormality. Surgical clips at the hilum. Adrenals/Urinary Tract: Normal adrenal glands. No hydronephrosis. Mild bilateral perinephric edema, and greater on the left. Punctate nonobstructing stones in the upper left kidney. Exophytic 7 mm hypodensity from the lower left kidney with slight increased size from prior. Subtle heterogeneous left renal enhancement. Urinary bladder is minimally distended with wall thickening and stranding about the dome. Stomach/Bowel: Post Nissen fundoplication. The stomach is nondistended. Moderate length segment of fluid-filled small bowel in the left upper quadrant with bowel wall thickening, hyperemia, perienteric inflammation,  mesenteric edema and small amount of free fluid. No abrupt transition. More distal small bowel is decompressed. Postsurgical changes of the anterior abdominal wall with herniation of anti mesenteric border of small bowel in the right abdomen, unchanged from prior exam without associated inflammation or obstruction. Normal appendix. Moderate colonic stool burden without colonic wall thickening. Diverticulosis of the descending and sigmoid colon without diverticulitis. Vascular/Lymphatic: Minimal aortic atherosclerosis. Retroaortic left renal vein. No abdominal or pelvic adenopathy. Reproductive: Marked prostatic enlargement causing mass effect on the bladder base. Prostate gland measures 6.8 x 6.0 x 7.6 cm (volume = 160 cm^3). There scattered calcifications. Other: Mesenteric edema and free fluid in the region of small bowel inflammation. No free air. No intra-abdominal abscess. Postsurgical change of the anterior abdominal wall, mild herniation of transverse colon through wide necked hernia versus abdominal wall laxity in the upper abdomen, similar to prior. Musculoskeletal: There are no acute or suspicious osseous abnormalities. There is degenerative change in the spine. IMPRESSION: 1. Moderate length segment of small bowel inflammation in the left abdomen consistent with enteritis, may be infectious or inflammatory. Free fluid but no perforation or free air. 2. Ground-glass and peribronchovascular opacities in the visualized lung bases, greater in the left lung, infectious etiology favored over pulmonary edema. This is not well seen radiographically. 3. Unchanged appearance of the anterior abdominal wall with wide necked upper ventral hernia versus abdominal wall laxity and small right lateral abdominal wall hernia containing small knuckle of small bowel. No associated inflammation or obstruction. 4. Colonic diverticulosis without diverticulitis. 5. Marked prostate enlargement. Mild bladder wall thickening may be  due to chronic bladder outlet obstruction, however there is mild perinephric edema and possible heterogeneous left renal enhancement raising concern for urinary tract infection. Recommend correlation with urinalysis. 6. Nonobstructing left renal stones. 7.  Aortic Atherosclerosis (ICD10-I70.0). Electronically Signed   By: Jeb Levering M.D.   On: 09/09/2017 00:08   Dg Abd Acute W/chest  Result Date: 09/08/2017 CLINICAL DATA:  Initial evaluation for acute nausea, vomiting. EXAM: DG ABDOMEN ACUTE W/ 1V CHEST COMPARISON:  Prior CT from 02/19/2013. FINDINGS: Mild cardiomegaly.  Mediastinal silhouette normal. Lungs hypoinflated. Associated bibasilar atelectasis. No focal infiltrates. No pulmonary edema or pleural effusion. No pneumothorax. Bowel gas pattern within normal limits without evidence for obstruction or ileus. No air-fluid levels on upright projection. No free air. No soft tissue mass or abnormal calcifications. Sequelae of prior cholecystectomy. Multiple surgical clips overlie the left upper quadrant. Hernia tacks present within left lower quadrant. No acute osseus abnormality. Degenerative changes about the hips bilaterally and within the lower lumbar spine. IMPRESSION: 1. Nonobstructive bowel gas pattern with no radiographic evidence for acute intra-abdominal pathology. 2. Shallow lung inflation with associated bibasilar atelectasis. No other active cardiopulmonary disease. Electronically Signed   By: Jeannine Boga M.D.   On:  09/08/2017 22:43   Images viewed by me.    Delora Fuel, MD 48/62/82 [phone]

## 2017-09-10 MED FILL — Ondansetron HCl Tab 4 MG: ORAL | Qty: 4 | Status: AC

## 2017-09-11 DIAGNOSIS — H2513 Age-related nuclear cataract, bilateral: Secondary | ICD-10-CM | POA: Diagnosis not present

## 2017-09-11 DIAGNOSIS — H04123 Dry eye syndrome of bilateral lacrimal glands: Secondary | ICD-10-CM | POA: Diagnosis not present

## 2017-09-11 DIAGNOSIS — H5711 Ocular pain, right eye: Secondary | ICD-10-CM | POA: Diagnosis not present

## 2017-09-11 DIAGNOSIS — H43813 Vitreous degeneration, bilateral: Secondary | ICD-10-CM | POA: Diagnosis not present

## 2017-09-12 ENCOUNTER — Telehealth: Payer: Self-pay | Admitting: Internal Medicine

## 2017-09-12 ENCOUNTER — Inpatient Hospital Stay (HOSPITAL_COMMUNITY)
Admission: EM | Admit: 2017-09-12 | Discharge: 2017-09-15 | DRG: 194 | Disposition: A | Discharge status: Home / Self Care | Payer: Medicare Other | Attending: Internal Medicine | Admitting: Internal Medicine

## 2017-09-12 ENCOUNTER — Emergency Department (HOSPITAL_COMMUNITY): Payer: Medicare Other

## 2017-09-12 ENCOUNTER — Encounter (HOSPITAL_COMMUNITY): Payer: Self-pay | Admitting: *Deleted

## 2017-09-12 DIAGNOSIS — Z23 Encounter for immunization: Secondary | ICD-10-CM | POA: Diagnosis not present

## 2017-09-12 DIAGNOSIS — J189 Pneumonia, unspecified organism: Secondary | ICD-10-CM | POA: Diagnosis not present

## 2017-09-12 DIAGNOSIS — E871 Hypo-osmolality and hyponatremia: Secondary | ICD-10-CM | POA: Diagnosis present

## 2017-09-12 DIAGNOSIS — F419 Anxiety disorder, unspecified: Secondary | ICD-10-CM | POA: Diagnosis present

## 2017-09-12 DIAGNOSIS — E213 Hyperparathyroidism, unspecified: Secondary | ICD-10-CM | POA: Diagnosis present

## 2017-09-12 DIAGNOSIS — Z8249 Family history of ischemic heart disease and other diseases of the circulatory system: Secondary | ICD-10-CM | POA: Diagnosis not present

## 2017-09-12 DIAGNOSIS — Z87891 Personal history of nicotine dependence: Secondary | ICD-10-CM | POA: Diagnosis not present

## 2017-09-12 DIAGNOSIS — F329 Major depressive disorder, single episode, unspecified: Secondary | ICD-10-CM | POA: Diagnosis not present

## 2017-09-12 DIAGNOSIS — E872 Acidosis: Secondary | ICD-10-CM | POA: Diagnosis present

## 2017-09-12 DIAGNOSIS — I10 Essential (primary) hypertension: Secondary | ICD-10-CM | POA: Diagnosis present

## 2017-09-12 DIAGNOSIS — K529 Noninfective gastroenteritis and colitis, unspecified: Secondary | ICD-10-CM | POA: Diagnosis not present

## 2017-09-12 DIAGNOSIS — R509 Fever, unspecified: Secondary | ICD-10-CM | POA: Diagnosis not present

## 2017-09-12 DIAGNOSIS — Z9049 Acquired absence of other specified parts of digestive tract: Secondary | ICD-10-CM | POA: Diagnosis not present

## 2017-09-12 DIAGNOSIS — I4510 Unspecified right bundle-branch block: Secondary | ICD-10-CM | POA: Diagnosis present

## 2017-09-12 DIAGNOSIS — J440 Chronic obstructive pulmonary disease with acute lower respiratory infection: Secondary | ICD-10-CM | POA: Diagnosis present

## 2017-09-12 DIAGNOSIS — E861 Hypovolemia: Secondary | ICD-10-CM | POA: Diagnosis present

## 2017-09-12 DIAGNOSIS — K469 Unspecified abdominal hernia without obstruction or gangrene: Secondary | ICD-10-CM | POA: Diagnosis present

## 2017-09-12 DIAGNOSIS — R079 Chest pain, unspecified: Secondary | ICD-10-CM | POA: Diagnosis not present

## 2017-09-12 DIAGNOSIS — R109 Unspecified abdominal pain: Secondary | ICD-10-CM | POA: Diagnosis not present

## 2017-09-12 DIAGNOSIS — Z86010 Personal history of colonic polyps: Secondary | ICD-10-CM | POA: Diagnosis not present

## 2017-09-12 DIAGNOSIS — K219 Gastro-esophageal reflux disease without esophagitis: Secondary | ICD-10-CM | POA: Diagnosis present

## 2017-09-12 DIAGNOSIS — F32A Depression, unspecified: Secondary | ICD-10-CM | POA: Diagnosis present

## 2017-09-12 DIAGNOSIS — M1990 Unspecified osteoarthritis, unspecified site: Secondary | ICD-10-CM | POA: Diagnosis present

## 2017-09-12 DIAGNOSIS — R1110 Vomiting, unspecified: Secondary | ICD-10-CM | POA: Diagnosis not present

## 2017-09-12 DIAGNOSIS — G500 Trigeminal neuralgia: Secondary | ICD-10-CM | POA: Diagnosis present

## 2017-09-12 DIAGNOSIS — E291 Testicular hypofunction: Secondary | ICD-10-CM | POA: Diagnosis present

## 2017-09-12 DIAGNOSIS — E785 Hyperlipidemia, unspecified: Secondary | ICD-10-CM | POA: Diagnosis present

## 2017-09-12 DIAGNOSIS — K56609 Unspecified intestinal obstruction, unspecified as to partial versus complete obstruction: Secondary | ICD-10-CM | POA: Diagnosis not present

## 2017-09-12 DIAGNOSIS — Z87442 Personal history of urinary calculi: Secondary | ICD-10-CM | POA: Diagnosis not present

## 2017-09-12 DIAGNOSIS — R9431 Abnormal electrocardiogram [ECG] [EKG]: Secondary | ICD-10-CM | POA: Diagnosis not present

## 2017-09-12 DIAGNOSIS — D510 Vitamin B12 deficiency anemia due to intrinsic factor deficiency: Secondary | ICD-10-CM | POA: Diagnosis present

## 2017-09-12 LAB — LIPASE, BLOOD: LIPASE: 19 U/L (ref 11–51)

## 2017-09-12 LAB — COMPREHENSIVE METABOLIC PANEL
ALT: 19 U/L (ref 17–63)
ANION GAP: 8 (ref 5–15)
AST: 21 U/L (ref 15–41)
Albumin: 3.4 g/dL — ABNORMAL LOW (ref 3.5–5.0)
Alkaline Phosphatase: 57 U/L (ref 38–126)
BUN: 6 mg/dL (ref 6–20)
CHLORIDE: 96 mmol/L — AB (ref 101–111)
CO2: 29 mmol/L (ref 22–32)
CREATININE: 0.78 mg/dL (ref 0.61–1.24)
Calcium: 8.6 mg/dL — ABNORMAL LOW (ref 8.9–10.3)
GFR calc non Af Amer: >60 mL/min (ref >60)
Glucose, Bld: 127 mg/dL — ABNORMAL HIGH (ref 65–99)
POTASSIUM: 4.1 mmol/L (ref 3.5–5.1)
SODIUM: 133 mmol/L — AB (ref 135–145)
Total Bilirubin: 1.1 mg/dL (ref 0.3–1.2)
Total Protein: 6.7 g/dL (ref 6.5–8.1)

## 2017-09-12 LAB — URINALYSIS, ROUTINE W REFLEX MICROSCOPIC
Bilirubin Urine: NEGATIVE
GLUCOSE, UA: NEGATIVE mg/dL
HGB URINE DIPSTICK: NEGATIVE
Ketones, ur: NEGATIVE mg/dL
LEUKOCYTES UA: NEGATIVE
Nitrite: NEGATIVE
PH: 9 — AB (ref 5.0–8.0)
Protein, ur: NEGATIVE mg/dL
SPECIFIC GRAVITY, URINE: 1.015 (ref 1.005–1.030)

## 2017-09-12 LAB — CBC
HEMATOCRIT: 41.7 % (ref 39.0–52.0)
HEMOGLOBIN: 14.6 g/dL (ref 13.0–17.0)
MCH: 31.2 pg (ref 26.0–34.0)
MCHC: 35 g/dL (ref 30.0–36.0)
MCV: 89.1 fL (ref 78.0–100.0)
Platelets: 150 10*3/uL (ref 150–400)
RBC: 4.68 MIL/uL (ref 4.22–5.81)
RDW: 12.9 % (ref 11.5–15.5)
WBC: 9.2 10*3/uL (ref 4.0–10.5)

## 2017-09-12 LAB — I-STAT CG4 LACTIC ACID, ED
LACTIC ACID, VENOUS: 1.37 mmol/L (ref 0.5–1.9)
Lactic Acid, Venous: 2.31 mmol/L (ref 0.5–1.9)

## 2017-09-12 MED ORDER — SODIUM CHLORIDE 0.9 % IV BOLUS (SEPSIS)
1000.0000 mL | Freq: Once | INTRAVENOUS | Status: AC
  Administered 2017-09-12: 1000 mL via INTRAVENOUS

## 2017-09-12 MED ORDER — PROMETHAZINE HCL 25 MG PO TABS
12.5000 mg | ORAL_TABLET | Freq: Four times a day (QID) | ORAL | Status: DC | PRN
  Administered 2017-09-12 – 2017-09-14 (×6): 12.5 mg via ORAL
  Filled 2017-09-12 (×6): qty 1

## 2017-09-12 MED ORDER — PANTOPRAZOLE SODIUM 40 MG PO TBEC
40.0000 mg | DELAYED_RELEASE_TABLET | Freq: Every day | ORAL | Status: DC
Start: 2017-09-13 — End: 2017-09-15
  Administered 2017-09-13 – 2017-09-15 (×3): 40 mg via ORAL
  Filled 2017-09-12 (×3): qty 1

## 2017-09-12 MED ORDER — IOPAMIDOL (ISOVUE-300) INJECTION 61%
INTRAVENOUS | Status: AC
  Administered 2017-09-12: 100 mL
  Filled 2017-09-12: qty 100

## 2017-09-12 MED ORDER — SODIUM CHLORIDE 0.9 % IV SOLN
INTRAVENOUS | Status: AC
  Administered 2017-09-12: 23:00:00 via INTRAVENOUS

## 2017-09-12 MED ORDER — GABAPENTIN 400 MG PO CAPS
800.0000 mg | ORAL_CAPSULE | Freq: Two times a day (BID) | ORAL | Status: DC
  Administered 2017-09-12 – 2017-09-15 (×6): 800 mg via ORAL
  Filled 2017-09-12 (×5): qty 2

## 2017-09-12 MED ORDER — ALPRAZOLAM 0.5 MG PO TABS
1.0000 mg | ORAL_TABLET | Freq: Every evening | ORAL | Status: DC | PRN
  Administered 2017-09-14: 1 mg via ORAL
  Filled 2017-09-12: qty 2

## 2017-09-12 MED ORDER — LOSARTAN POTASSIUM 50 MG PO TABS
50.0000 mg | ORAL_TABLET | Freq: Every day | ORAL | Status: DC
  Administered 2017-09-13 – 2017-09-15 (×3): 50 mg via ORAL
  Filled 2017-09-12 (×3): qty 1

## 2017-09-12 MED ORDER — ALUM & MAG HYDROXIDE-SIMETH 200-200-20 MG/5ML PO SUSP
30.0000 mL | Freq: Once | ORAL | Status: AC
  Administered 2017-09-12: 30 mL via ORAL
  Filled 2017-09-12: qty 30

## 2017-09-12 MED ORDER — HYDROCODONE-ACETAMINOPHEN 5-325 MG PO TABS
1.0000 | ORAL_TABLET | ORAL | Status: DC | PRN
  Administered 2017-09-13: 1 via ORAL
  Administered 2017-09-13: 2 via ORAL
  Administered 2017-09-13: 1 via ORAL
  Filled 2017-09-12 (×4): qty 1

## 2017-09-12 MED ORDER — ENOXAPARIN SODIUM 40 MG/0.4ML ~~LOC~~ SOLN
40.0000 mg | SUBCUTANEOUS | Status: DC
  Administered 2017-09-13: 40 mg via SUBCUTANEOUS
  Filled 2017-09-12: qty 0.4

## 2017-09-12 MED ORDER — PROMETHAZINE HCL 25 MG/ML IJ SOLN
12.5000 mg | Freq: Four times a day (QID) | INTRAMUSCULAR | Status: DC | PRN
  Administered 2017-09-13: 12.5 mg via INTRAVENOUS
  Filled 2017-09-12: qty 1

## 2017-09-12 MED ORDER — ASPIRIN EC 81 MG PO TBEC
81.0000 mg | DELAYED_RELEASE_TABLET | Freq: Every day | ORAL | Status: DC
  Administered 2017-09-13 – 2017-09-15 (×3): 81 mg via ORAL
  Filled 2017-09-12 (×3): qty 1

## 2017-09-12 MED ORDER — VANCOMYCIN HCL IN DEXTROSE 750-5 MG/150ML-% IV SOLN: 750.0000 mg | Freq: Two times a day (BID) | INTRAVENOUS | Status: DC

## 2017-09-12 MED ORDER — ACETAMINOPHEN 325 MG PO TABS: 650.0000 mg | ORAL_TABLET | Freq: Four times a day (QID) | ORAL | Status: DC | PRN

## 2017-09-12 MED ORDER — DEXTROSE 5 % IV SOLN
2.0000 g | Freq: Three times a day (TID) | INTRAVENOUS | Status: DC
  Administered 2017-09-12: 2 g via INTRAVENOUS
  Filled 2017-09-12: qty 2

## 2017-09-12 MED ORDER — CARBAMAZEPINE 200 MG PO TABS
200.0000 mg | ORAL_TABLET | Freq: Two times a day (BID) | ORAL | Status: DC
  Administered 2017-09-12 – 2017-09-15 (×6): 200 mg via ORAL
  Filled 2017-09-12 (×5): qty 1

## 2017-09-12 MED ORDER — CEFTRIAXONE SODIUM 1 G IJ SOLR
1.0000 g | INTRAMUSCULAR | Status: DC
  Administered 2017-09-13 – 2017-09-15 (×3): 1 g via INTRAVENOUS
  Filled 2017-09-12 (×3): qty 10

## 2017-09-12 MED ORDER — VANCOMYCIN HCL 10 G IV SOLR
2000.0000 mg | Freq: Once | INTRAVENOUS | Status: DC
  Filled 2017-09-12: qty 2000

## 2017-09-12 MED ORDER — DEXTROSE 5 % IV SOLN
500.0000 mg | INTRAVENOUS | Status: DC
  Administered 2017-09-13 – 2017-09-15 (×3): 500 mg via INTRAVENOUS
  Filled 2017-09-12 (×3): qty 500

## 2017-09-12 NOTE — ED Triage Notes (Signed)
Pt reports going to [facility] recently for abd pain with n/v. Pt was told he had viral infection with elevated WBC. Pt still has n/v, sweats, fatigue, excessive burping and some abd pain. Has been taking antibiotics as prescribed.

## 2017-09-12 NOTE — Telephone Encounter (Signed)
Patients wife has called in.  States that she thought she was going to get a call right back from [facility] in regard to a work in this morning with Dr. [name].  Wife called in stating that patient had some rattle in his chest and was having a really hard time breathing.  [facility] did suggest that patient go to the ED.  Wife refused,  stated that patient had been to the ED over the weekend.  States that she wanted patient to follow up with Dr. [name] this morning.  Dr.Johns schedule was booked and [facility] was to send a message over to Dr. [name]. Spouse was concerned about the wait time on response.  I did explain to the patient that she may not get an immediate response.  I offered to ask Dr. [name] if patient could be worked in today or to schedule patient for tomorrow for an ER follow up and explained the turnaround time would not have been immediate for [facility] in this regard.  Spouse felt like [facility] should have gave her an estimated call back time frame.  Spouse states she is going to take patient to [facility] ED.

## 2017-09-12 NOTE — ED Notes (Signed)
ED Provider at bedside. 

## 2017-09-12 NOTE — H&P (Signed)
History and Physical    [name] NFA:[id number] DOB: [birth year] DOA: 09/12/2017  PCP: [name], MD   Patient coming from: Home  Chief Complaint: Cough, dyspnea, N/V, fevers   HPI: [name] is a 66 y.o. male with medical history significant for depression, anxiety, pernicious anemia, and hypertension, now presenting to the emergency department for evaluation of fevers, nausea, vomiting, cough, and shortness of breath.  Patient was evaluated for these complaints in another emergency department 3 days earlier, was diagnosed with possible pneumonia and viral enteritis, and was discharged home with Zofran and doxycycline.  His symptoms have persisted with no appreciable improvement since that time, prompting him to return.  ED Course: Upon arrival to the ED, patient is found to be afebrile, saturating well on room air, and with vitals otherwise stable.  EKG features a sinus rhythm with incomplete right bundle branch block.  CT of the abdomen and pelvis features mild fluid-filled small bowel loops without obstruction, and consistent with enteritis.  CT chest is notable for multifocal airspace opacities in the right middle lobe, lingula, and bilateral bases, consistent with multifocal pneumonia.  CMP is notable for sodium of 133 and CBC is normal.  Urinalysis is under, lactic acid is slightly elevated at 2.31.  Patient was treated with 1 L of normal saline, vancomycin, lactic acid normalized.  He remained hemodynamically stable, has not been in any acute distress, and will be admitted to the medical-surgical unit for ongoing evaluation and management of community-acquired pneumonia and enteritis.  Review of Systems:  All other systems reviewed and apart from HPI, are negative.  Past Medical History:  Diagnosis Date  . Allergic rhinitis   . Anemia, pernicious   . Anxiety and depression    sees Dr. [name]  . Arthritis   . Catheter-associated urinary tract infection ([place])     . Diverticulosis of colon   . Elevated PSA    and hypogonadism, sees urologist  . Facial asymmetry   . GERD (gastroesophageal reflux disease)    hiatal hernia  . HTN (hypertension)   . Hx of blood transfusion reaction   . Hyperlipidemia   . Hyperparathyroidism ([place])   . Hyperplastic colon polyp   . Hypertension    acei causes cough  . IBS (irritable bowel syndrome)   . Nephrolithiasis   . Obstructive chronic bronchitis without exacerbation, followed by Dr. [name] 03/04/2009   ONO RA  Normal 07/28/11 6 min walk >542m no desat PFTs 08/08/11: DLCO 70% TLC 80%  FeV1 80%   Fef 25 75 56% with significant improvement after BD   . Peritonitis ([facility])    after surgery in 1999  . PONV (postoperative nausea and vomiting)   . Rectal fissure   . Trigeminal neuralgia    has facial asymetry  . Ventral hernia     Past Surgical History:  Procedure Laterality Date  . ABDOMINAL SURGERY     Multiple  . BRAIN SURGERY  March 2012   Trigeminal nerve  . CHOLECYSTECTOMY  1981  . HAND SURGERY  1973   Left  . [place]   Left  . LITHOTRIPSY     Right  . NISSEN FUNDOPLICATION  4696    complicated by gastric perforation, peritonitis, ventral nernia   . UPPER GASTROINTESTINAL ENDOSCOPY    . VENTRAL HERNIA REPAIR       reports that he quit smoking about 20 years ago. His smoking use included  cigarettes. He has a 20.00 pack-year smoking history. He quit smokeless tobacco use about 33 years ago. His smokeless tobacco use included chew. He reports that he does not drink alcohol or use drugs.  Allergies  Allergen Reactions  . Dilantin [Phenytoin]     bradycardia  . Ace Inhibitors Cough  . Augmentin [Amoxicillin-Pot Clavulanate] Nausea And Vomiting  . Codeine Nausea And Vomiting  . Dilaudid [Hydromorphone Hcl]   . Sulfa Antibiotics     hives    Family History  Problem Relation Age of Onset  . Diabetes Mother   . Coronary artery disease Mother        CABG  .  Diabetes Sister   . Diabetes Brother   . Hypertension Brother   . Clotting disorder Brother   . Colon cancer Neg Hx   . Esophageal cancer Neg Hx   . Stomach cancer Neg Hx      Prior to Admission medications   Medication Sig Start Date End Date Taking? Authorizing Provider  acetaminophen (TYLENOL) 325 MG tablet Take 650 mg by mouth every 6 (six) hours as needed (pain).   Yes [provider]  ALPRAZolam Duanne Moron) 1 MG tablet Take 1 mg by mouth at bedtime as needed for anxiety.    Yes [provider]  aspirin EC 81 MG tablet Take 1 tablet (81 mg total) by mouth daily. 01/13/16  Yes [name], MD  carbamazepine (TEGRETOL) 200 MG tablet Take 200 mg 2 (two) times daily by mouth. 09/04/17  Yes [provider]  cyanocobalamin (,VITAMIN B-12,) 1000 MCG/ML injection Inject 1 mL (1,000 mcg total) into the skin every 30 (thirty) days. 01/22/17  Yes Nandigam, Venia Minks, MD  doxycycline (VIBRAMYCIN) 100 MG capsule Take 1 capsule (100 mg total) by mouth 2 (two) times daily. 33/8/25  Yes Delora Fuel, MD  FLUZONE QUADRIVALENT injection 1 each once. 08/10/17  Yes [provider]  gabapentin (NEURONTIN) 400 MG capsule Take 800 mg 2 (two) times daily by mouth.    Yes [provider]  hydrocortisone 2.5 % cream Apply 1 application 2 (two) times daily as needed topically (TO NOSE).  06/13/17  Yes [provider]  losartan-hydrochlorothiazide (HYZAAR) 50-12.5 MG tablet Take 1 tablet by mouth daily. 03/19/17  Yes [name], MD  magnesium citrate SOLN Take 0.5 Bottles once by mouth.   Yes [provider]  NEXIUM 40 MG capsule Take 1 capsule (40 mg total) by mouth daily. 01/17/16  Yes [name], MD  ondansetron (ZOFRAN) 4 MG tablet Take 1 tablet (4 mg total) by mouth every 6 (six) hours as needed. 03/08/96  Yes Delora Fuel, MD  polyethylene glycol powder (GLYCOLAX/MIRALAX) powder DISSOLVE 1 CAPFUL IN LIQUID EVERY DAY AS NEEDED FOR CONSTIPATION Patient  taking differently: DISSOLVE 1 CAPFUL IN LIQUID EVERY DAY 02/18/14  Yes Lucretia Kern, DO    Physical Exam: Vitals:   09/12/17 1955 09/12/17 2030 09/12/17 2100 09/12/17 2245  BP: 135/73 140/77 137/84 (!) 152/91  Pulse: 75 73 76 79  Resp: 18     Temp:      TempSrc:      SpO2: 98% 98% 98% 96%      Constitutional: NAD, calm, in apparent discomfort  Eyes: PERTLA, lids and conjunctivae normal ENMT: Mucous membranes are moist. Posterior pharynx clear of any exudate or lesions.   Neck: normal, supple, no masses, no thyromegaly Respiratory: Mild dyspnea with speech, diffuse rhonchi. No accessory muscle use.  Cardiovascular: S1 &  S2 heard, regular rate and rhythm. No extremity edema. No significant JVD. Abdomen: No distension, no tenderness, no masses palpated. Bowel sounds normal.  Musculoskeletal: no clubbing / cyanosis. No joint deformity upper and lower extremities.  Skin: no significant rashes, lesions, ulcers. Warm, dry, well-perfused. Neurologic: CN 2-12 grossly intact. Sensation intact. Strength 5/5 in all 4 limbs.  Psychiatric: Alert and oriented x 3. Pleasant and cooperative.     Labs on Admission: I have personally reviewed following labs and imaging studies  CBC: Recent Labs  Lab 09/08/17 2125 09/12/17 1117  WBC 16.8* 9.2  HGB 17.8* 14.6  HCT 50.1 41.7  MCV 89.0 89.1  PLT 176 962   Basic Metabolic Panel: Recent Labs  Lab 09/08/17 2125 09/12/17 1117  NA 138 133*  K 4.0 4.1  CL 97* 96*  CO2 30 29  GLUCOSE 138* 127*  BUN 12 6  CREATININE 1.12 0.78  CALCIUM 9.3 8.6*   GFR: Estimated Creatinine Clearance: 114.3 mL/min (by C-G formula based on SCr of 0.78 mg/dL). Liver Function Tests: Recent Labs  Lab 09/08/17 2125 09/12/17 1117  AST 23 21  ALT 18 19  ALKPHOS 74 57  BILITOT 0.9 1.1  PROT 7.4 6.7  ALBUMIN 4.2 3.4*   Recent Labs  Lab 09/08/17 2125 09/12/17 1117  LIPASE 22 19   No results for input(s): AMMONIA in the last 168 hours. Coagulation  Profile: No results for input(s): INR, PROTIME in the last 168 hours. Cardiac Enzymes: No results for input(s): CKTOTAL, CKMB, CKMBINDEX, TROPONINI in the last 168 hours. BNP (last 3 results) No results for input(s): PROBNP in the last 8760 hours. HbA1C: No results for input(s): HGBA1C in the last 72 hours. CBG: No results for input(s): GLUCAP in the last 168 hours. Lipid Profile: No results for input(s): CHOL, HDL, LDLCALC, TRIG, CHOLHDL, LDLDIRECT in the last 72 hours. Thyroid Function Tests: No results for input(s): TSH, T4TOTAL, FREET4, T3FREE, THYROIDAB in the last 72 hours. Anemia Panel: No results for input(s): VITAMINB12, FOLATE, FERRITIN, TIBC, IRON, RETICCTPCT in the last 72 hours. Urine analysis:    Component Value Date/Time   COLORURINE YELLOW 09/12/2017 1203   APPEARANCEUR CLEAR 09/12/2017 1203   LABSPEC 1.015 09/12/2017 1203   PHURINE 9.0 (H) 09/12/2017 1203   GLUCOSEU NEGATIVE 09/12/2017 1203   GLUCOSEU NEGATIVE 01/18/2017 1020   HGBUR NEGATIVE 09/12/2017 [place] 09/12/2017 1203   BILIRUBINUR negative 05/11/2011 1528   KETONESUR NEGATIVE 09/12/2017 1203   PROTEINUR NEGATIVE 09/12/2017 1203   UROBILINOGEN 0.2 01/18/2017 1020   NITRITE NEGATIVE 09/12/2017 1203   LEUKOCYTESUR NEGATIVE 09/12/2017 1203   Sepsis Labs: @LABRCNTIP (procalcitonin:4,lacticidven:4) )No results found for this or any previous visit (from the past 240 hour(s)).   Radiological Exams on Admission: Dg Chest 2 View  Result Date: 09/12/2017 CLINICAL DATA:  Chest pain with nausea and vomiting. EXAM: CHEST  2 VIEW COMPARISON:  09/08/2017 and prior radiographs FINDINGS: New lingular and left lower lobe airspace disease likely represents pneumonia. The cardiomediastinal silhouette is otherwise unremarkable. Mild right basilar scarring again identified. There is no evidence of pulmonary edema, pleural effusion or pneumothorax. IMPRESSION: Lingular and left lower lobe airspace  disease likely representing pneumonia. Radiographic follow-up to resolution recommended. Electronically Signed   By: Margarette Canada M.D.   On: 09/12/2017 18:21   Ct Chest W Contrast  Result Date: 09/12/2017 CLINICAL DATA:  Chest and upper abdominal pain with nausea and vomiting x1 week. EXAM: CT CHEST, ABDOMEN, AND PELVIS WITH CONTRAST TECHNIQUE: Multidetector  CT imaging of the chest, abdomen and pelvis was performed following the standard protocol during bolus administration of intravenous contrast. CONTRAST:  137mL ISOVUE-300 IOPAMIDOL (ISOVUE-300) INJECTION 61% COMPARISON:  Same day CXR, CT abdomen and pelvis 09/08/2017 FINDINGS: CT CHEST FINDINGS Cardiovascular: Heart is normal in size with minimal coronary arteriosclerosis along the distal LAD. No pericardial effusion. Mediastinum/Nodes: Small mediastinal and hilar lymph nodes likely reactive in etiology. Trachea and mainstem bronchi are patent. Esophagus is unremarkable. No thyromegaly or mass. Lungs/Pleura: Multifocal pneumonic consolidations involving the right middle lobe, lingula and both lower lobes, left greater than right, more prominently involving the left lower lobe. Scattered pulmonary nodular densities are seen within and about the pulmonary consolidations which may be postinfectious or postinflammatory in etiology. Follow-up to complete resolution is recommended. Tiny left effusion. Musculoskeletal: Thoracic spondylosis without acute nor suspicious osseous abnormality. CT ABDOMEN PELVIS FINDINGS Hepatobiliary: No focal hepatic lesions. No biliary dilatation. Status post cholecystectomy. Pancreas: Pancreatic atrophy without inflammation or ductal dilatation. Spleen: Normal. A few surgical clips are seen in the left upper quadrant adjacent to the spleen. Adrenals/Urinary Tract: Normal adrenal glands. Chronic stable mild perinephric fat stranding. Punctate nonobstructing renal calculus in the left upper pole. Small exophytic renal cyst is seen off  the lower pole of the left kidney measuring 8 mm in diameter. No enhancing renal masses. No obstructive uropathy. Physiologic distention of the urinary bladder. Stomach/Bowel: Status post Nissen fundoplication. The stomach is nondistended. Moderate segmental fluid-filled distention of small bowel in the left upper quadrant is again re- demonstrated suggestive of mild enteritis. No mechanical source of bowel obstruction is seen. Postsurgical changes are noted along the anterior abdominal wall with herniation of the the anti mesenteric border of a tiny segment of nonobstructed small bowel in the right hemiabdomen, unchanged from prior. No associated inflammation or obstruction. Normal appendix. Moderate colonic fecal retention without colonic wall thickening. There is diverticulosis of the descending sigmoid colon without diverticulitis. Vascular/Lymphatic: Minimal aortic atherosclerosis without aneurysm or dissection. No lymphadenopathy. Reproductive: Marked enlargement of the prostate is again re- demonstrated with peripheral zone calcifications posteriorly. The prostate measures approximately 6.5 x 6 x 7 cm in transverse by AP by craniocaudad. Other: Ventral upper abdominal wall laxity is again re- demonstrated versus wide mouth ventral hernia. Musculoskeletal: Thoracolumbar degenerative changes of the spine. Mild bilateral hip joint space narrowing. No acute nor suspicious osseous abnormality. IMPRESSION: Chest: 1. Multifocal airspace opacities in the right middle lobe, lingula and both lower lobes consistent multifocal pneumonia. Probable postinfectious or postinflammatory nodular densities are seen among the areas of pulmonary consolidation. Trace left effusion. 2. Minimal coronary arteriosclerosis. 3. Mild reactive mediastinal and hilar lymph nodes without pathologic enlargement. Abdomen and pelvis: 1. Mild fluid-filled small bowel loops without mechanical obstruction consistent with enteritis. 2. Unchanged  anterior abdominal wall laxity involving the upper abdomen versus wide-mouth ventral hernia. No bowel obstruction is seen though there are too large bowel loops seen in this area of presumed laxity. 3. Nonobstructing Richter's type hernia containing small bowel in the right hemiabdomen. 4. Marked prostatomegaly. 5. Stable nonobstructing left upper pole renal calculus and left lower pole renal cyst. 6. Colonic diverticulosis without acute diverticulitis. Electronically Signed   By: Ashley Royalty M.D.   On: 09/12/2017 21:57   Ct Abdomen Pelvis W Contrast  Result Date: 09/12/2017 CLINICAL DATA:  Chest and upper abdominal pain with nausea and vomiting x1 week. EXAM: CT CHEST, ABDOMEN, AND PELVIS WITH CONTRAST TECHNIQUE: Multidetector CT imaging of the chest, abdomen and pelvis  was performed following the standard protocol during bolus administration of intravenous contrast. CONTRAST:  165mL ISOVUE-300 IOPAMIDOL (ISOVUE-300) INJECTION 61% COMPARISON:  Same day CXR, CT abdomen and pelvis 09/08/2017 FINDINGS: CT CHEST FINDINGS Cardiovascular: Heart is normal in size with minimal coronary arteriosclerosis along the distal LAD. No pericardial effusion. Mediastinum/Nodes: Small mediastinal and hilar lymph nodes likely reactive in etiology. Trachea and mainstem bronchi are patent. Esophagus is unremarkable. No thyromegaly or mass. Lungs/Pleura: Multifocal pneumonic consolidations involving the right middle lobe, lingula and both lower lobes, left greater than right, more prominently involving the left lower lobe. Scattered pulmonary nodular densities are seen within and about the pulmonary consolidations which may be postinfectious or postinflammatory in etiology. Follow-up to complete resolution is recommended. Tiny left effusion. Musculoskeletal: Thoracic spondylosis without acute nor suspicious osseous abnormality. CT ABDOMEN PELVIS FINDINGS Hepatobiliary: No focal hepatic lesions. No biliary dilatation. Status post  cholecystectomy. Pancreas: Pancreatic atrophy without inflammation or ductal dilatation. Spleen: Normal. A few surgical clips are seen in the left upper quadrant adjacent to the spleen. Adrenals/Urinary Tract: Normal adrenal glands. Chronic stable mild perinephric fat stranding. Punctate nonobstructing renal calculus in the left upper pole. Small exophytic renal cyst is seen off the lower pole of the left kidney measuring 8 mm in diameter. No enhancing renal masses. No obstructive uropathy. Physiologic distention of the urinary bladder. Stomach/Bowel: Status post Nissen fundoplication. The stomach is nondistended. Moderate segmental fluid-filled distention of small bowel in the left upper quadrant is again re- demonstrated suggestive of mild enteritis. No mechanical source of bowel obstruction is seen. Postsurgical changes are noted along the anterior abdominal wall with herniation of the the anti mesenteric border of a tiny segment of nonobstructed small bowel in the right hemiabdomen, unchanged from prior. No associated inflammation or obstruction. Normal appendix. Moderate colonic fecal retention without colonic wall thickening. There is diverticulosis of the descending sigmoid colon without diverticulitis. Vascular/Lymphatic: Minimal aortic atherosclerosis without aneurysm or dissection. No lymphadenopathy. Reproductive: Marked enlargement of the prostate is again re- demonstrated with peripheral zone calcifications posteriorly. The prostate measures approximately 6.5 x 6 x 7 cm in transverse by AP by craniocaudad. Other: Ventral upper abdominal wall laxity is again re- demonstrated versus wide mouth ventral hernia. Musculoskeletal: Thoracolumbar degenerative changes of the spine. Mild bilateral hip joint space narrowing. No acute nor suspicious osseous abnormality. IMPRESSION: Chest: 1. Multifocal airspace opacities in the right middle lobe, lingula and both lower lobes consistent multifocal pneumonia. Probable  postinfectious or postinflammatory nodular densities are seen among the areas of pulmonary consolidation. Trace left effusion. 2. Minimal coronary arteriosclerosis. 3. Mild reactive mediastinal and hilar lymph nodes without pathologic enlargement. Abdomen and pelvis: 1. Mild fluid-filled small bowel loops without mechanical obstruction consistent with enteritis. 2. Unchanged anterior abdominal wall laxity involving the upper abdomen versus wide-mouth ventral hernia. No bowel obstruction is seen though there are too large bowel loops seen in this area of presumed laxity. 3. Nonobstructing Richter's type hernia containing small bowel in the right hemiabdomen. 4. Marked prostatomegaly. 5. Stable nonobstructing left upper pole renal calculus and left lower pole renal cyst. 6. Colonic diverticulosis without acute diverticulitis. Electronically Signed   By: Ashley Royalty M.D.   On: 09/12/2017 21:57   Dg Abd 2 Views  Result Date: 09/12/2017 CLINICAL DATA:  Hernia EXAM: ABDOMEN - 2 VIEW COMPARISON:  09/08/2017 FINDINGS: No free air beneath the diaphragm. Surgical clips in the upper quadrants. Surgical changes in the left abdomen. Increased gaseous enlargement of central and left abdominal small bowel  loops, measuring up to 4.8 cm. Colon gas is present. IMPRESSION: Increased gaseous enlargement of central and left abdominal small bowel with colon gas present; findings could be secondary to ileus or a partial/developing bowel obstruction. Electronically Signed   By: Donavan Foil M.D.   On: 09/12/2017 18:22    EKG: Independently reviewed. Sinus rhythm, incomplete RBBB.   Assessment/Plan  1. Pneumonia  - Pt presents with cough and dyspnea, diagnosed on 11/4 with PNA and started on doxycyline but with no improvement  - No fever here and no leukocytosis or hypoxia, but sxs and imaging consistent with multifocal PNA  - Plan to culture blood and sputum, trend procalcitonin, check urine for strep pneumo antigen, start  Rocephin and azithromycin  2. Enteritis  - Pt presents with N/V, CT findings consistent with enteritis  - Continue IVF hydration, prn antiemetics, monitor lytes, send GI panel if diarrhea ensues  3. Hyponatremia  - Serum sodium is 130 on admission in the setting of N/V with hypovolemia  - Treated in ED with 1 liter NS and continued on NS infusion - Repeat chem panel in am   4. Hypertension   - BP is at goal  - Hold HCTZ while hydrating, continue ARB    5. Anxiety  - Continue prn Xanax     DVT prophylaxis: Lovenox Code Status: Full  Family Communication: Discussed with patient Disposition Plan: Admit to med-surg Consults called: None  Admission status: Inpatient    Vianne Bulls, MD [facility] Pager [phone]  If 7PM-7AM, please contact night-coverage [email or web address] Password [email or web address]  09/12/2017, 10:58 PM

## 2017-09-12 NOTE — ED Notes (Signed)
Wife at bedside states she gave these meds already while waiting for results of tests (neurontin, tegretol)

## 2017-09-12 NOTE — ED Notes (Signed)
Attempted PIV x 2 by 2 RNs unsuccessful; will page IV team

## 2017-09-12 NOTE — Telephone Encounter (Signed)
Noted.  Dr. [name] FYI 

## 2017-09-12 NOTE — ED Notes (Signed)
Patient transported to X-ray 

## 2017-09-12 NOTE — Telephone Encounter (Signed)
Patient Name: [name]  DOB: 1950-11-18    Initial Comment husband vomiting, nausea, sweating, fever 100.5, cough, wheezing, chest moving fast   Nurse Assessment  Nurse: Leilani Merl, RN, Heather Date/Time (Eastern Time): 09/12/2017 8:22:25 AM  Confirm and document reason for call. If symptomatic, describe symptoms. ---Caller states that her husband has had vomiting, nausea, sweating, fever 100.5, cough, wheezing, chest moving fast. This started on Saturday and she took him to the ED on Sat pm and they said it might be pneumonia with a GI viral infection. He is not any better, she thinks that he is worse.  Does the patient have any new or worsening symptoms? ---Yes  Will a triage be completed? ---Yes  Related visit to physician within the last 2 weeks? ---Yes  Does the PT have any chronic conditions? (i.e. diabetes, asthma, etc.) ---Yes  List chronic conditions. ---See MR  Is this a behavioral health or substance abuse call? ---No     Guidelines    Guideline Title Affirmed Question Affirmed Notes  Cough - Acute Non-Productive Difficulty breathing    Final Disposition User   Go to ED Now Standifer, RN, Heather    Referrals  GO TO FACILITY REFUSED   Caller Disagree/Comply Disagree  Caller Understands Yes  PreDisposition Call Doctor

## 2017-09-12 NOTE — Progress Notes (Signed)
Pharmacy Antibiotic Note  [name] is a 66 y.o. male admitted on 09/12/2017 with pneumonia.  Pharmacy has been consulted for vancomycin/cefepime dosing. Noted, on doxy PTA. Afebrile, WBC wnl, LA 2.31. SCr 0.78 on admit, normalized CrCl~75.  Plan: Cefepime 2g IV q8h Vancomycin 2g IV x1; then 750mg  IV q12h Monitor clinical progress, c/s, renal function F/u de-escalation plan/LOT, vancomycin trough as indicated      Temp (24hrs), Avg:98.9 F (37.2 C), Min:98.7 F (37.1 C), Max:99.1 F (37.3 C)  Recent Labs  Lab 09/08/17 2125 09/12/17 1117 09/12/17 1147 09/12/17 1350  WBC 16.8* 9.2  --   --   CREATININE 1.12 0.78  --   --   LATICACIDVEN  --   --  2.31* 1.37    Estimated Creatinine Clearance: 114.3 mL/min (by C-G formula based on SCr of 0.78 mg/dL).    Allergies  Allergen Reactions  . Dilantin [Phenytoin]     bradycardia  . Ace Inhibitors Cough  . Augmentin [Amoxicillin-Pot Clavulanate] Nausea And Vomiting  . Codeine Nausea And Vomiting  . Dilaudid [Hydromorphone Hcl]   . Sulfa Antibiotics     hives    Antimicrobials this admission: 11/7 vancomycin >>  11/7 cefepime >>   Dose adjustments this admission:   Microbiology results:   Elicia Lamp, PharmD, BCPS Clinical Pharmacist 09/12/2017 10:23 PM

## 2017-09-12 NOTE — ED Notes (Signed)
Patient transported to CT 

## 2017-09-12 NOTE — ED Provider Notes (Signed)
[facility] EMERGENCY DEPARTMENT Provider Note   CSN: [id number] Arrival date & time: 09/12/17  1047     History   Chief Complaint Chief Complaint  Patient presents with  . Emesis    HPI [name] is a 66 y.o. male.  HPI   Patient is a 66 year old male with past medical history significant for history of hypertension hyperlipidemia irritable bowel syndrome anxiety and depression.  He is presenting today with continued abdominal pain.  Patient was seen at [facility] 5 days ago.  At that point he had labs showing increased white count, CT showing mild enteritis with groundglass densities in the bilateral lungs.  He was treated with doxycycline and given Zofran for nausea.  Patient reports that he still has nausea, although he is not taking any of his Zofran because it makes him "too tired".  He reports that he still occasionally has a cough.  The cough hurts his abdominal hernia.  Patient had a normal bowel movement today.  Patient been taking both gabapentin and Tegretol for his trigeminal neuralgia.  Past Medical History:  Diagnosis Date  . Allergic rhinitis   . Anemia, pernicious   . Anxiety and depression    sees Dr. [name]  . Arthritis   . Catheter-associated urinary tract infection ([place])   . Diverticulosis of colon   . Elevated PSA    and hypogonadism, sees urologist  . Facial asymmetry   . GERD (gastroesophageal reflux disease)    hiatal hernia  . HTN (hypertension)   . Hx of blood transfusion reaction   . Hyperlipidemia   . Hyperparathyroidism ([place])   . Hyperplastic colon polyp   . Hypertension    acei causes cough  . IBS (irritable bowel syndrome)   . Nephrolithiasis   . Obstructive chronic bronchitis without exacerbation, followed by Dr. [name] 03/04/2009   ONO RA  Normal 07/28/11 6 min walk >524m no desat PFTs 08/08/11: DLCO 70% TLC 80%  FeV1 80%   Fef 25 75 56% with significant improvement after BD   . Peritonitis ([place])    after surgery in  1999  . PONV (postoperative nausea and vomiting)   . Rectal fissure   . Trigeminal neuralgia    has facial asymetry  . Ventral hernia     Patient Active Problem List   Diagnosis Date Noted  . Acute sinusitis 07/06/2017  . Hyperglycemia 01/18/2017  . Encounter for well adult exam with abnormal findings 01/13/2016  . Absolute anemia 10/15/2015  . Cervical spondylosis, treated by Dr. Gladstone Lighter, [facility] 06/12/2013  . Ventral hernia 03/04/2013  . Anxiety and depression, followed by Dr. [name] in Psych 11/19/2012  . Hiatal hernia, followed by Dr. Hassell Done ([place]) and Dr. Olevia Perches ([facility]) 11/19/2012  . Other testicular hypofunction, followed by Dr. Karsten Ro in Urology 10/15/2012  . Trigeminal neuralgia, s/p NSU, followed by Dr. Vertell Limber 10/15/2012  . BPH (benign prostatic hyperplasia), followed by Dr. Karsten Ro 07/26/2011  . Rosacea 09/02/2010  . COLONIC POLYPS, HYPERPLASTIC, HX OF 12/13/2009  . Obstructive chronic bronchitis without exacerbation, followed by Dr. [name] 03/04/2009  . Allergic rhinitis 04/03/2008  . Essential hypertension 05/08/2007  . GERD followed by Dr. Olevia Perches 05/08/2007    Past Surgical History:  Procedure Laterality Date  . ABDOMINAL SURGERY     Multiple  . BRAIN SURGERY  March 2012   Trigeminal nerve  . CHOLECYSTECTOMY  1981  . HAND SURGERY  1973   Left  . HERNIA REPAIR    . KNEE  SURGERY  1997   Left  . LITHOTRIPSY     Right  . NISSEN FUNDOPLICATION  2951    complicated by gastric perforation, peritonitis, ventral nernia   . UPPER GASTROINTESTINAL ENDOSCOPY    . VENTRAL HERNIA REPAIR         Home Medications    Prior to Admission medications   Medication Sig Start Date End Date Taking? Authorizing Provider  acetaminophen (TYLENOL) 325 MG tablet Take 650 mg by mouth every 6 (six) hours as needed (pain).   Yes [provider]  ALPRAZolam Duanne Moron) 1 MG tablet Take 1 mg by mouth at bedtime as needed for anxiety.    Yes [provider]    aspirin EC 81 MG tablet Take 1 tablet (81 mg total) by mouth daily. 01/13/16  Yes [name], MD  carbamazepine (TEGRETOL) 200 MG tablet Take 200 mg 2 (two) times daily by mouth. 09/04/17  Yes [provider]  cyanocobalamin (,VITAMIN B-12,) 1000 MCG/ML injection Inject 1 mL (1,000 mcg total) into the skin every 30 (thirty) days. 01/22/17  Yes Nandigam, Venia Minks, MD  doxycycline (VIBRAMYCIN) 100 MG capsule Take 1 capsule (100 mg total) by mouth 2 (two) times daily. 88/4/16  Yes Delora Fuel, MD  FLUZONE QUADRIVALENT injection 1 each once. 08/10/17  Yes [provider]  gabapentin (NEURONTIN) 400 MG capsule Take 800 mg 2 (two) times daily by mouth.    Yes [provider]  hydrocortisone 2.5 % cream Apply 1 application 2 (two) times daily as needed topically (TO NOSE).  06/13/17  Yes [provider]  losartan-hydrochlorothiazide (HYZAAR) 50-12.5 MG tablet Take 1 tablet by mouth daily. 03/19/17  Yes [name], MD  magnesium citrate SOLN Take 0.5 Bottles once by mouth.   Yes [provider]  NEXIUM 40 MG capsule Take 1 capsule (40 mg total) by mouth daily. 01/17/16  Yes [name], MD  ondansetron (ZOFRAN) 4 MG tablet Take 1 tablet (4 mg total) by mouth every 6 (six) hours as needed. 60/6/30  Yes Delora Fuel, MD  polyethylene glycol powder (GLYCOLAX/MIRALAX) powder DISSOLVE 1 CAPFUL IN LIQUID EVERY DAY AS NEEDED FOR CONSTIPATION Patient taking differently: DISSOLVE 1 CAPFUL IN LIQUID EVERY DAY 02/18/14  Yes Lucretia Kern, DO  carbamazepine (CARBATROL) 100 MG 12 hr capsule Take 1 capsule (100 mg total) by mouth 2 (two) times daily. 11/29/16   Carlisle Cater, PA-C  dexamethasone (DECADRON) 4 MG tablet Take 1 tablet (4 mg total) by mouth 2 (two) times daily with a meal. 07/21/17   Tanna Furry, MD  NON FORMULARY TMLAE-all natural mens supplement    [provider]  ondansetron (ZOFRAN) 4 MG tablet Take 1 tablet (4 mg total) by mouth every 8 (eight)  hours as needed for nausea or vomiting. 16/0/10   Delora Fuel, MD  SYRINGE-NEEDLE, DISP, 3 ML 22G X 1-1/2" 3 ML MISC 1 Syringe by Does not apply route every 30 (thirty) days. 01/22/17   Mauri Pole, MD    Family History Family History  Problem Relation Age of Onset  . Diabetes Mother   . Coronary artery disease Mother        CABG  . Diabetes Sister   . Diabetes Brother   . Hypertension Brother   . Clotting disorder Brother   . Colon cancer Neg Hx   . Esophageal cancer Neg Hx   . Stomach cancer Neg Hx     Social History Social History   Tobacco  Use  . Smoking status: Former Smoker    Packs/day: 1.00    Years: 20.00    Pack years: 20.00    Types: Cigarettes    Last attempt to quit: 11/06/1996    Years since quitting: 20.8  . Smokeless tobacco: Former Systems developer    Types: Chew    Quit date: 11/07/1983  Substance Use Topics  . Alcohol use: No  . Drug use: No     Allergies   Dilantin [phenytoin]; Ace inhibitors; Augmentin [amoxicillin-pot clavulanate]; Codeine; Dilaudid [hydromorphone hcl]; and Sulfa antibiotics   Review of Systems Review of Systems  Constitutional: Positive for fatigue. Negative for activity change and fever.  Respiratory: Positive for cough. Negative for shortness of breath.   Cardiovascular: Negative for chest pain.  Gastrointestinal: Positive for abdominal pain and nausea. Negative for constipation and vomiting.  All other systems reviewed and are negative.    Physical Exam Updated Vital Signs BP 133/78 (BP Location: Right Arm)   Pulse 78   Temp 98.7 F (37.1 C) (Oral)   Resp 18   SpO2 97%   Physical Exam  Constitutional: He is oriented to person, place, and time. He appears well-nourished.  HENT:  Head: Normocephalic.  Eyes: Conjunctivae are normal. Right eye exhibits no discharge. Left eye exhibits no discharge.  Cardiovascular: Normal rate and regular rhythm.  No murmur heard. Pulmonary/Chest: Effort normal and breath sounds  normal. No respiratory distress.  Abdominal: Soft. He exhibits no distension.  Mild tenderness over hernia.  Neurological: He is oriented to person, place, and time.  Equal strength bilaterally upper and lower extremities negative pronator drift. Normal sensation bilaterally. Speech comprehensible, no slurring. Facial nerve tested and appears grossly normal. Alert and oriented 3.   Skin: Skin is warm and dry. He is not diaphoretic.  Psychiatric: He has a normal mood and affect. His behavior is normal.     ED Treatments / Results  Labs (all labs ordered are listed, but only abnormal results are displayed) Labs Reviewed  COMPREHENSIVE METABOLIC PANEL - Abnormal; Notable for the following components:      Result Value   Sodium 133 (*)    Chloride 96 (*)    Glucose, Bld 127 (*)    Calcium 8.6 (*)    Albumin 3.4 (*)    All other components within normal limits  URINALYSIS, ROUTINE W REFLEX MICROSCOPIC - Abnormal; Notable for the following components:   pH 9.0 (*)    All other components within normal limits  I-STAT CG4 LACTIC ACID, ED - Abnormal; Notable for the following components:   Lactic Acid, Venous 2.31 (*)    All other components within normal limits  LIPASE, BLOOD  CBC  I-STAT CG4 LACTIC ACID, ED    EKG  EKG Interpretation  Date/Time:  Wednesday September 12 2017 11:05:16 EST Ventricular Rate:  85 PR Interval:  138 QRS Duration: 100 QT Interval:  334 QTC Calculation: 397 R Axis:   47 Text Interpretation:  Normal sinus rhythm Incomplete right bundle branch block Borderline ECG Normal sinus rhythm Confirmed by Thomasene Lot, [place] [phone]) on 09/12/2017 4:51:41 PM       Radiology No results found.  Procedures Procedures (including critical care time)  Medications Ordered in ED Medications - No data to display   Initial Impression / Assessment and Plan / ED Course  I have reviewed the triage vital signs and the nursing notes.  Pertinent labs & imaging  results that were available during my care of the patient were  reviewed by me and considered in my medical decision making (see chart for details).     Patient is a 66 year old male with past medical history significant for history of hypertension hyperlipidemia irritable bowel syndrome anxiety and depression.  He is presenting today with continued abdominal pain.  Patient was seen at [facility] 5 days ago.  At that point he had labs showing increased white count, CT showing mild enteritis with groundglass densities in the bilateral lungs.  He was treated with doxycycline and given Zofran for nausea.  Patient reports that he still has nausea, although he is not taking any of his Zofran because it makes him "too tired".  He reports that he still occasionally has a cough.  The cough hurts his abdominal hernia.  Patient had a normal bowel movement today.  Patient been taking both gabapentin and Tegretol for his trigeminal neuralgia.  5:29 PM Patient symptoms are mildly vague.  He still had mild nausea.  Mild feelings of  unwell.  Mild unsteadiness on his feet.  However on physical exam he is really reassuring.  His vital signs are all normal and his labs are completely improved from previous visit. Don't know quite how to move forward in this case given that his labs and physical exam are so reassuring.  We will get chest x-ray to make sure pneumonia is resolved.  We will get x-ray of the abdomen to make sure there is no obstruction (although doubtful given his normal exam).  We will give him fluids, and give him a chance to use a different medication Phenergan that may be causing some less sleepiness and allows him to eat.  However patient seems to been taking his medications at home, and no vomiting here.   10:43 PM X-ray showed potentially worsening pneumonia with question enteritis versus small bowel obstruction.  Will get CAT scan.  CAT scan showed worsening pneumonia multifocal air disease.  Given the  patient's been on outpatient therapy and failed, will admit for IV antibiotics.   Final Clinical Impressions(s) / ED Diagnoses   Final diagnoses:  None    ED Discharge Orders    None       Kelena Garrow, Fredia Sorrow, MD 09/12/17 2351

## 2017-09-13 ENCOUNTER — Other Ambulatory Visit: Payer: Self-pay

## 2017-09-13 DIAGNOSIS — I10 Essential (primary) hypertension: Secondary | ICD-10-CM

## 2017-09-13 DIAGNOSIS — F329 Major depressive disorder, single episode, unspecified: Secondary | ICD-10-CM

## 2017-09-13 DIAGNOSIS — F419 Anxiety disorder, unspecified: Secondary | ICD-10-CM

## 2017-09-13 DIAGNOSIS — J189 Pneumonia, unspecified organism: Principal | ICD-10-CM

## 2017-09-13 DIAGNOSIS — R109 Unspecified abdominal pain: Secondary | ICD-10-CM

## 2017-09-13 DIAGNOSIS — E871 Hypo-osmolality and hyponatremia: Secondary | ICD-10-CM

## 2017-09-13 DIAGNOSIS — K529 Noninfective gastroenteritis and colitis, unspecified: Secondary | ICD-10-CM

## 2017-09-13 LAB — CBC WITH DIFFERENTIAL/PLATELET
BASOS ABS: 0 10*3/uL (ref 0.0–0.1)
BASOS PCT: 0 %
Eosinophils Absolute: 0.1 10*3/uL (ref 0.0–0.7)
Eosinophils Relative: 1 %
HEMATOCRIT: 42.4 % (ref 39.0–52.0)
HEMOGLOBIN: 14.8 g/dL (ref 13.0–17.0)
LYMPHS PCT: 21 %
Lymphs Abs: 1.9 10*3/uL (ref 0.7–4.0)
MCH: 31.1 pg (ref 26.0–34.0)
MCHC: 34.9 g/dL (ref 30.0–36.0)
MCV: 89.1 fL (ref 78.0–100.0)
MONO ABS: 1.4 10*3/uL — AB (ref 0.1–1.0)
MONOS PCT: 15 %
NEUTROS ABS: 5.8 10*3/uL (ref 1.7–7.7)
NEUTROS PCT: 63 %
Platelets: 142 10*3/uL — ABNORMAL LOW (ref 150–400)
RBC: 4.76 MIL/uL (ref 4.22–5.81)
RDW: 12.7 % (ref 11.5–15.5)
WBC: 9.2 10*3/uL (ref 4.0–10.5)

## 2017-09-13 LAB — PROCALCITONIN: Procalcitonin: <0.1 ng/mL

## 2017-09-13 LAB — INFLUENZA PANEL BY PCR (TYPE A & B)
INFLAPCR: NEGATIVE
INFLBPCR: NEGATIVE

## 2017-09-13 LAB — BASIC METABOLIC PANEL
ANION GAP: 10 (ref 5–15)
BUN: 6 mg/dL (ref 6–20)
CALCIUM: 8.5 mg/dL — AB (ref 8.9–10.3)
CHLORIDE: 97 mmol/L — AB (ref 101–111)
CO2: 27 mmol/L (ref 22–32)
Creatinine, Ser: 0.78 mg/dL (ref 0.61–1.24)
GFR calc non Af Amer: >60 mL/min (ref >60)
GLUCOSE: 107 mg/dL — AB (ref 65–99)
Potassium: 3.6 mmol/L (ref 3.5–5.1)
Sodium: 134 mmol/L — ABNORMAL LOW (ref 135–145)

## 2017-09-13 LAB — STREP PNEUMONIAE URINARY ANTIGEN: Strep Pneumo Urinary Antigen: NEGATIVE

## 2017-09-13 MED ORDER — HYDRALAZINE HCL 20 MG/ML IJ SOLN: 10.0000 mg | Freq: Four times a day (QID) | INTRAMUSCULAR | Status: DC | PRN

## 2017-09-13 MED ORDER — PNEUMOCOCCAL VAC POLYVALENT 25 MCG/0.5ML IJ INJ
0.5000 mL | INJECTION | INTRAMUSCULAR | Status: AC
  Administered 2017-09-14: 0.5 mL via INTRAMUSCULAR
  Filled 2017-09-13 (×2): qty 0.5

## 2017-09-13 NOTE — Progress Notes (Signed)
Pt arrived to unit, ambulated to bed. Pt wife Velva Harman is with him at the bedside. Identified appropriately. Placed on continuous pulse oximetry, current O2 sat 96. Pt c/o nausea, PRN phenergan administered. Upon assessment, pt's left foot was noted to be red, hot, and tender to the touch. No swelling noted. MD notified. Will continue to monitor.

## 2017-09-13 NOTE — Progress Notes (Signed)
[facility]                                                                              Patient Demographics  [name], is a 66 y.o. male, DOB - 26-Sep-1951, VWU:[id number]  Admit date - 09/12/2017   Admitting Physician Vianne Bulls, MD  Outpatient Primary MD for the patient is [name], MD  Outpatient specialists:   LOS - 1  days   Medical records reviewed and are as summarized below:    Chief Complaint  Patient presents with  . Emesis       Brief summary   Per admit note by Dr. Myna Hidalgo on 11/7 [name] is a 66 y.o. male with medical history significant for depression, anxiety, pernicious anemia, and hypertension, now presenting to the emergency department for evaluation of fevers, nausea, vomiting, cough, and shortness of breath.  Patient was evaluated for these complaints in another emergency department 3 days earlier, was diagnosed with possible pneumonia and viral enteritis, and was discharged home with Zofran and doxycycline.  His symptoms have persisted with no appreciable improvement since that time, prompting him to return. CT chest is notable for multifocal airspace opacities in the right middle lobe, lingula, and bilateral bases, consistent with multifocal pneumonia.  CT of the abdomen and pelvis features mild fluid-filled small bowel loops without obstruction, and consistent with enteritis.  Assessment & Plan    Principal Problem:   Multifocal pneumonia -Presented with cough, dyspnea, was started on doxycycline with no significant improvement.  CT chest with multifocal airspace opacities/pneumonia.  Pro-calcitonin less than 0.1 -Follow blood cultures, placed on IV Rocephin, Zithromax  - Influenza panel negative - PT eval    Active Problems:   Enteritis -Presented with nausea and vomiting, currently with no diarrhea - continue IV fluid hydration, PRN antiemetics IV antibiotics  Essential hypertension -Continue to  hold HCTZ, continue gentle hydration, ARB    Anxiety and depression, followed by Dr. [name] in Psych -Currently stable, no SI, HI -Continue Xanax prn     Hyponatremia -Improving, continue gentle hydration  Lactic acidosis -Resolved with IV fluids   Code Status: full  DVT Prophylaxis:  Lovenox  Family Communication: Discussed in detail with the patient, all imaging results, lab results explained to the patient and wife    Disposition Plan: Hopefully in next 24-48 hours  Time Spent in minutes   25  minutes  Procedures:  CT chest, abd    Consultants:   None   Antimicrobials:      Medications  Scheduled Meds: . aspirin EC  81 mg Oral Daily  . carbamazepine  200 mg Oral BID  . enoxaparin (LOVENOX) injection  40 mg Subcutaneous Q24H  . gabapentin  800 mg Oral BID  . losartan  50 mg Oral Daily  . pantoprazole  40 mg Oral Daily  . [START ON 09/14/2017] pneumococcal 23 valent vaccine  0.5 mL Intramuscular Tomorrow-1000   Continuous Infusions: . azithromycin Stopped (09/13/17 0133)  . cefTRIAXone (ROCEPHIN)  IV Stopped (09/13/17 0558)   PRN Meds:.acetaminophen, ALPRAZolam, HYDROcodone-acetaminophen, promethazine, promethazine   Antibiotics   Anti-infectives (From admission, onward)  Start     Dose/Rate Route Frequency Ordered Stop   09/13/17 1130  vancomycin (VANCOCIN) IVPB 750 mg/150 ml premix  Status:  Discontinued     750 mg 150 mL/hr over 60 Minutes Intravenous Every 12 hours 09/12/17 2228 09/12/17 2258   09/13/17 0600  cefTRIAXone (ROCEPHIN) 1 g in dextrose 5 % 50 mL IVPB     1 g 100 mL/hr over 30 Minutes Intravenous Every 24 hours 09/12/17 2258 09/20/17 0559   09/13/17 0000  azithromycin (ZITHROMAX) 500 mg in dextrose 5 % 250 mL IVPB     500 mg 250 mL/hr over 60 Minutes Intravenous Every 24 hours 09/12/17 2258 09/19/17 2359   09/12/17 2230  ceFEPIme (MAXIPIME) 2 g in dextrose 5 % 50 mL IVPB  Status:  Discontinued     2 g 100 mL/hr over 30 Minutes  Intravenous Every 8 hours 09/12/17 2228 09/12/17 2258   09/12/17 2230  vancomycin (VANCOCIN) 2,000 mg in sodium chloride 0.9 % 500 mL IVPB  Status:  Discontinued     2,000 mg 250 mL/hr over 120 Minutes Intravenous  Once 09/12/17 2228 09/12/17 2258        Subjective:   [name] was seen and examined today. Still dry coughing, low-grade temp of 99.3 F.  patient denies dizziness, chest pain, abdominal pain, N/V/D/C, new weakness, numbess, tingling. No acute events overnight.    Objective:   Vitals:   09/12/17 2315 09/12/17 2330 09/12/17 2353 09/13/17 0551  BP: (!) 149/86 (!) 145/86 (!) 158/79 (!) 150/77  Pulse: 80 81 86 81  Resp:   18 18  Temp:   99.3 F (37.4 C) 99.3 F (37.4 C)  TempSrc:   Oral Oral  SpO2: 97% 96% 98% 98%  Weight:   108.4 kg (238 lb 15.7 oz)   Height:   5\' 11"  (1.803 m)     Intake/Output Summary (Last 24 hours) at 09/13/2017 1052 Last data filed at 09/13/2017 1020 Gross per 24 hour  Intake 1953.34 ml  Output 2380 ml  Net -426.66 ml     Wt Readings from Last 3 Encounters:  09/12/17 108.4 kg (238 lb 15.7 oz)  09/08/17 106.6 kg (235 lb)  07/27/17 106.6 kg (235 lb)     Exam  General: Alert and oriented x 3, NAD  Eyes:   HEENT:  Atraumatic, normocephalic  Cardiovascular: S1 S2 auscultated, no rubs, murmurs or gallops. Regular rate and rhythm.  Respiratory: b/l rhonchi improving  Gastrointestinal: Soft, nontender, nondistended, + bowel sounds  Ext: no pedal edema bilaterally  Neuro: AAOx3, Cr N's II- XII. Strength 5/5 upper and lower extremities bilaterally, speech clear,   Musculoskeletal: No digital cyanosis, clubbing  Skin: No rashes  Psych: Normal affect and demeanor, alert and oriented x3    Data Reviewed:  I have personally reviewed following labs and imaging studies  Micro Results No results found for this or any previous visit (from the past 240 hour(s)).  Radiology Reports Dg Chest 2 View  Result Date:  09/12/2017 CLINICAL DATA:  Chest pain with nausea and vomiting. EXAM: CHEST  2 VIEW COMPARISON:  09/08/2017 and prior radiographs FINDINGS: New lingular and left lower lobe airspace disease likely represents pneumonia. The cardiomediastinal silhouette is otherwise unremarkable. Mild right basilar scarring again identified. There is no evidence of pulmonary edema, pleural effusion or pneumothorax. IMPRESSION: Lingular and left lower lobe airspace disease likely representing pneumonia. Radiographic follow-up to resolution recommended. Electronically Signed   By: Margarette Canada M.D.   On:  09/12/2017 18:21   Ct Chest W Contrast  Result Date: 09/12/2017 CLINICAL DATA:  Chest and upper abdominal pain with nausea and vomiting x1 week. EXAM: CT CHEST, ABDOMEN, AND PELVIS WITH CONTRAST TECHNIQUE: Multidetector CT imaging of the chest, abdomen and pelvis was performed following the standard protocol during bolus administration of intravenous contrast. CONTRAST:  182mL ISOVUE-300 IOPAMIDOL (ISOVUE-300) INJECTION 61% COMPARISON:  Same day CXR, CT abdomen and pelvis 09/08/2017 FINDINGS: CT CHEST FINDINGS Cardiovascular: Heart is normal in size with minimal coronary arteriosclerosis along the distal LAD. No pericardial effusion. Mediastinum/Nodes: Small mediastinal and hilar lymph nodes likely reactive in etiology. Trachea and mainstem bronchi are patent. Esophagus is unremarkable. No thyromegaly or mass. Lungs/Pleura: Multifocal pneumonic consolidations involving the right middle lobe, lingula and both lower lobes, left greater than right, more prominently involving the left lower lobe. Scattered pulmonary nodular densities are seen within and about the pulmonary consolidations which may be postinfectious or postinflammatory in etiology. Follow-up to complete resolution is recommended. Tiny left effusion. Musculoskeletal: Thoracic spondylosis without acute nor suspicious osseous abnormality. CT ABDOMEN PELVIS FINDINGS  Hepatobiliary: No focal hepatic lesions. No biliary dilatation. Status post cholecystectomy. Pancreas: Pancreatic atrophy without inflammation or ductal dilatation. Spleen: Normal. A few surgical clips are seen in the left upper quadrant adjacent to the spleen. Adrenals/Urinary Tract: Normal adrenal glands. Chronic stable mild perinephric fat stranding. Punctate nonobstructing renal calculus in the left upper pole. Small exophytic renal cyst is seen off the lower pole of the left kidney measuring 8 mm in diameter. No enhancing renal masses. No obstructive uropathy. Physiologic distention of the urinary bladder. Stomach/Bowel: Status post Nissen fundoplication. The stomach is nondistended. Moderate segmental fluid-filled distention of small bowel in the left upper quadrant is again re- demonstrated suggestive of mild enteritis. No mechanical source of bowel obstruction is seen. Postsurgical changes are noted along the anterior abdominal wall with herniation of the the anti mesenteric border of a tiny segment of nonobstructed small bowel in the right hemiabdomen, unchanged from prior. No associated inflammation or obstruction. Normal appendix. Moderate colonic fecal retention without colonic wall thickening. There is diverticulosis of the descending sigmoid colon without diverticulitis. Vascular/Lymphatic: Minimal aortic atherosclerosis without aneurysm or dissection. No lymphadenopathy. Reproductive: Marked enlargement of the prostate is again re- demonstrated with peripheral zone calcifications posteriorly. The prostate measures approximately 6.5 x 6 x 7 cm in transverse by AP by craniocaudad. Other: Ventral upper abdominal wall laxity is again re- demonstrated versus wide mouth ventral hernia. Musculoskeletal: Thoracolumbar degenerative changes of the spine. Mild bilateral hip joint space narrowing. No acute nor suspicious osseous abnormality. IMPRESSION: Chest: 1. Multifocal airspace opacities in the right middle  lobe, lingula and both lower lobes consistent multifocal pneumonia. Probable postinfectious or postinflammatory nodular densities are seen among the areas of pulmonary consolidation. Trace left effusion. 2. Minimal coronary arteriosclerosis. 3. Mild reactive mediastinal and hilar lymph nodes without pathologic enlargement. Abdomen and pelvis: 1. Mild fluid-filled small bowel loops without mechanical obstruction consistent with enteritis. 2. Unchanged anterior abdominal wall laxity involving the upper abdomen versus wide-mouth ventral hernia. No bowel obstruction is seen though there are too large bowel loops seen in this area of presumed laxity. 3. Nonobstructing Richter's type hernia containing small bowel in the right hemiabdomen. 4. Marked prostatomegaly. 5. Stable nonobstructing left upper pole renal calculus and left lower pole renal cyst. 6. Colonic diverticulosis without acute diverticulitis. Electronically Signed   By: Ashley Royalty M.D.   On: 09/12/2017 21:57   Ct Abdomen Pelvis W Contrast  Result Date: 09/12/2017 CLINICAL DATA:  Chest and upper abdominal pain with nausea and vomiting x1 week. EXAM: CT CHEST, ABDOMEN, AND PELVIS WITH CONTRAST TECHNIQUE: Multidetector CT imaging of the chest, abdomen and pelvis was performed following the standard protocol during bolus administration of intravenous contrast. CONTRAST:  146mL ISOVUE-300 IOPAMIDOL (ISOVUE-300) INJECTION 61% COMPARISON:  Same day CXR, CT abdomen and pelvis 09/08/2017 FINDINGS: CT CHEST FINDINGS Cardiovascular: Heart is normal in size with minimal coronary arteriosclerosis along the distal LAD. No pericardial effusion. Mediastinum/Nodes: Small mediastinal and hilar lymph nodes likely reactive in etiology. Trachea and mainstem bronchi are patent. Esophagus is unremarkable. No thyromegaly or mass. Lungs/Pleura: Multifocal pneumonic consolidations involving the right middle lobe, lingula and both lower lobes, left greater than right, more  prominently involving the left lower lobe. Scattered pulmonary nodular densities are seen within and about the pulmonary consolidations which may be postinfectious or postinflammatory in etiology. Follow-up to complete resolution is recommended. Tiny left effusion. Musculoskeletal: Thoracic spondylosis without acute nor suspicious osseous abnormality. CT ABDOMEN PELVIS FINDINGS Hepatobiliary: No focal hepatic lesions. No biliary dilatation. Status post cholecystectomy. Pancreas: Pancreatic atrophy without inflammation or ductal dilatation. Spleen: Normal. A few surgical clips are seen in the left upper quadrant adjacent to the spleen. Adrenals/Urinary Tract: Normal adrenal glands. Chronic stable mild perinephric fat stranding. Punctate nonobstructing renal calculus in the left upper pole. Small exophytic renal cyst is seen off the lower pole of the left kidney measuring 8 mm in diameter. No enhancing renal masses. No obstructive uropathy. Physiologic distention of the urinary bladder. Stomach/Bowel: Status post Nissen fundoplication. The stomach is nondistended. Moderate segmental fluid-filled distention of small bowel in the left upper quadrant is again re- demonstrated suggestive of mild enteritis. No mechanical source of bowel obstruction is seen. Postsurgical changes are noted along the anterior abdominal wall with herniation of the the anti mesenteric border of a tiny segment of nonobstructed small bowel in the right hemiabdomen, unchanged from prior. No associated inflammation or obstruction. Normal appendix. Moderate colonic fecal retention without colonic wall thickening. There is diverticulosis of the descending sigmoid colon without diverticulitis. Vascular/Lymphatic: Minimal aortic atherosclerosis without aneurysm or dissection. No lymphadenopathy. Reproductive: Marked enlargement of the prostate is again re- demonstrated with peripheral zone calcifications posteriorly. The prostate measures approximately  6.5 x 6 x 7 cm in transverse by AP by craniocaudad. Other: Ventral upper abdominal wall laxity is again re- demonstrated versus wide mouth ventral hernia. Musculoskeletal: Thoracolumbar degenerative changes of the spine. Mild bilateral hip joint space narrowing. No acute nor suspicious osseous abnormality. IMPRESSION: Chest: 1. Multifocal airspace opacities in the right middle lobe, lingula and both lower lobes consistent multifocal pneumonia. Probable postinfectious or postinflammatory nodular densities are seen among the areas of pulmonary consolidation. Trace left effusion. 2. Minimal coronary arteriosclerosis. 3. Mild reactive mediastinal and hilar lymph nodes without pathologic enlargement. Abdomen and pelvis: 1. Mild fluid-filled small bowel loops without mechanical obstruction consistent with enteritis. 2. Unchanged anterior abdominal wall laxity involving the upper abdomen versus wide-mouth ventral hernia. No bowel obstruction is seen though there are too large bowel loops seen in this area of presumed laxity. 3. Nonobstructing Richter's type hernia containing small bowel in the right hemiabdomen. 4. Marked prostatomegaly. 5. Stable nonobstructing left upper pole renal calculus and left lower pole renal cyst. 6. Colonic diverticulosis without acute diverticulitis. Electronically Signed   By: Ashley Royalty M.D.   On: 09/12/2017 21:57   Ct Abdomen Pelvis W Contrast  Result Date: 09/09/2017 CLINICAL DATA:  Abdominal pain  diverticulitis suspected. EXAM: CT ABDOMEN AND PELVIS WITH CONTRAST TECHNIQUE: Multidetector CT imaging of the abdomen and pelvis was performed using the standard protocol following bolus administration of intravenous contrast. CONTRAST:  120mL ISOVUE-300 IOPAMIDOL (ISOVUE-300) INJECTION 61% COMPARISON:  Radiographs earlier this day.  CT 05/11/2015 FINDINGS: Lower chest: Ground-glass and peribronchovascular opacities in the lingula, right middle lobes, right lower and predominantly in the  left lower lobe. No pleural fluid. Hepatobiliary: No focal hepatic lesion. Clips in the gallbladder fossa postcholecystectomy. No biliary dilatation. Pancreas: Parenchymal atrophy. No ductal dilatation or inflammation. Spleen: Normal in size without focal abnormality. Surgical clips at the hilum. Adrenals/Urinary Tract: Normal adrenal glands. No hydronephrosis. Mild bilateral perinephric edema, and greater on the left. Punctate nonobstructing stones in the upper left kidney. Exophytic 7 mm hypodensity from the lower left kidney with slight increased size from prior. Subtle heterogeneous left renal enhancement. Urinary bladder is minimally distended with wall thickening and stranding about the dome. Stomach/Bowel: Post Nissen fundoplication. The stomach is nondistended. Moderate length segment of fluid-filled small bowel in the left upper quadrant with bowel wall thickening, hyperemia, perienteric inflammation, mesenteric edema and small amount of free fluid. No abrupt transition. More distal small bowel is decompressed. Postsurgical changes of the anterior abdominal wall with herniation of anti mesenteric border of small bowel in the right abdomen, unchanged from prior exam without associated inflammation or obstruction. Normal appendix. Moderate colonic stool burden without colonic wall thickening. Diverticulosis of the descending and sigmoid colon without diverticulitis. Vascular/Lymphatic: Minimal aortic atherosclerosis. Retroaortic left renal vein. No abdominal or pelvic adenopathy. Reproductive: Marked prostatic enlargement causing mass effect on the bladder base. Prostate gland measures 6.8 x 6.0 x 7.6 cm (volume = 160 cm^3). There scattered calcifications. Other: Mesenteric edema and free fluid in the region of small bowel inflammation. No free air. No intra-abdominal abscess. Postsurgical change of the anterior abdominal wall, mild herniation of transverse colon through wide necked hernia versus abdominal  wall laxity in the upper abdomen, similar to prior. Musculoskeletal: There are no acute or suspicious osseous abnormalities. There is degenerative change in the spine. IMPRESSION: 1. Moderate length segment of small bowel inflammation in the left abdomen consistent with enteritis, may be infectious or inflammatory. Free fluid but no perforation or free air. 2. Ground-glass and peribronchovascular opacities in the visualized lung bases, greater in the left lung, infectious etiology favored over pulmonary edema. This is not well seen radiographically. 3. Unchanged appearance of the anterior abdominal wall with wide necked upper ventral hernia versus abdominal wall laxity and small right lateral abdominal wall hernia containing small knuckle of small bowel. No associated inflammation or obstruction. 4. Colonic diverticulosis without diverticulitis. 5. Marked prostate enlargement. Mild bladder wall thickening may be due to chronic bladder outlet obstruction, however there is mild perinephric edema and possible heterogeneous left renal enhancement raising concern for urinary tract infection. Recommend correlation with urinalysis. 6. Nonobstructing left renal stones. 7.  Aortic Atherosclerosis (ICD10-I70.0). Electronically Signed   By: Jeb Levering M.D.   On: 09/09/2017 00:08   Dg Abd 2 Views  Result Date: 09/12/2017 CLINICAL DATA:  Hernia EXAM: ABDOMEN - 2 VIEW COMPARISON:  09/08/2017 FINDINGS: No free air beneath the diaphragm. Surgical clips in the upper quadrants. Surgical changes in the left abdomen. Increased gaseous enlargement of central and left abdominal small bowel loops, measuring up to 4.8 cm. Colon gas is present. IMPRESSION: Increased gaseous enlargement of central and left abdominal small bowel with colon gas present; findings could be secondary to ileus  or a partial/developing bowel obstruction. Electronically Signed   By: Donavan Foil M.D.   On: 09/12/2017 18:22   Dg Abd Acute  W/chest  Result Date: 09/08/2017 CLINICAL DATA:  Initial evaluation for acute nausea, vomiting. EXAM: DG ABDOMEN ACUTE W/ 1V CHEST COMPARISON:  Prior CT from 02/19/2013. FINDINGS: Mild cardiomegaly.  Mediastinal silhouette normal. Lungs hypoinflated. Associated bibasilar atelectasis. No focal infiltrates. No pulmonary edema or pleural effusion. No pneumothorax. Bowel gas pattern within normal limits without evidence for obstruction or ileus. No air-fluid levels on upright projection. No free air. No soft tissue mass or abnormal calcifications. Sequelae of prior cholecystectomy. Multiple surgical clips overlie the left upper quadrant. Hernia tacks present within left lower quadrant. No acute osseus abnormality. Degenerative changes about the hips bilaterally and within the lower lumbar spine. IMPRESSION: 1. Nonobstructive bowel gas pattern with no radiographic evidence for acute intra-abdominal pathology. 2. Shallow lung inflation with associated bibasilar atelectasis. No other active cardiopulmonary disease. Electronically Signed   By: Jeannine Boga M.D.   On: 09/08/2017 22:43    Lab Data:  CBC: Recent Labs  Lab 09/08/17 2125 09/12/17 1117 09/13/17 0018  WBC 16.8* 9.2 9.2  NEUTROABS  --   --  5.8  HGB 17.8* 14.6 14.8  HCT 50.1 41.7 42.4  MCV 89.0 89.1 89.1  PLT 176 150 810*   Basic Metabolic Panel: Recent Labs  Lab 09/08/17 2125 09/12/17 1117 09/13/17 0018  NA 138 133* 134*  K 4.0 4.1 3.6  CL 97* 96* 97*  CO2 30 29 27   GLUCOSE 138* 127* 107*  BUN 12 6 6   CREATININE 1.12 0.78 0.78  CALCIUM 9.3 8.6* 8.5*   GFR: Estimated Creatinine Clearance: 115.2 mL/min (by C-G formula based on SCr of 0.78 mg/dL). Liver Function Tests: Recent Labs  Lab 09/08/17 2125 09/12/17 1117  AST 23 21  ALT 18 19  ALKPHOS 74 57  BILITOT 0.9 1.1  PROT 7.4 6.7  ALBUMIN 4.2 3.4*   Recent Labs  Lab 09/08/17 2125 09/12/17 1117  LIPASE 22 19   No results for input(s): AMMONIA in the last  168 hours. Coagulation Profile: No results for input(s): INR, PROTIME in the last 168 hours. Cardiac Enzymes: No results for input(s): CKTOTAL, CKMB, CKMBINDEX, TROPONINI in the last 168 hours. BNP (last 3 results) No results for input(s): PROBNP in the last 8760 hours. HbA1C: No results for input(s): HGBA1C in the last 72 hours. CBG: No results for input(s): GLUCAP in the last 168 hours. Lipid Profile: No results for input(s): CHOL, HDL, LDLCALC, TRIG, CHOLHDL, LDLDIRECT in the last 72 hours. Thyroid Function Tests: No results for input(s): TSH, T4TOTAL, FREET4, T3FREE, THYROIDAB in the last 72 hours. Anemia Panel: No results for input(s): VITAMINB12, FOLATE, FERRITIN, TIBC, IRON, RETICCTPCT in the last 72 hours. Urine analysis:    Component Value Date/Time   COLORURINE YELLOW 09/12/2017 1203   APPEARANCEUR CLEAR 09/12/2017 1203   LABSPEC 1.015 09/12/2017 1203   PHURINE 9.0 (H) 09/12/2017 1203   GLUCOSEU NEGATIVE 09/12/2017 1203   GLUCOSEU NEGATIVE 01/18/2017 1020   HGBUR NEGATIVE 09/12/2017 1203   [place] 09/12/2017 1203   BILIRUBINUR negative 05/11/2011 [place] 09/12/2017 1203   PROTEINUR NEGATIVE 09/12/2017 1203   UROBILINOGEN 0.2 01/18/2017 1020   NITRITE NEGATIVE 09/12/2017 1203   LEUKOCYTESUR NEGATIVE 09/12/2017 1203     Ripudeep Rai M.D. [facility] 09/13/2017, 10:52 AM  Pager: [phone] Between 7am to 7pm - call Pager - [phone]  After 7pm go  to [email or web address] - password [email or web address]  Call night coverage person covering after 7pm

## 2017-09-13 NOTE — Progress Notes (Signed)
Incentive spirometer given to patient and educated on use, pt returns demonstration.

## 2017-09-14 LAB — BASIC METABOLIC PANEL
Anion gap: 7 (ref 5–15)
BUN: 6 mg/dL (ref 6–20)
CO2: 27 mmol/L (ref 22–32)
CREATININE: 0.78 mg/dL (ref 0.61–1.24)
Calcium: 8.3 mg/dL — ABNORMAL LOW (ref 8.9–10.3)
Chloride: 100 mmol/L — ABNORMAL LOW (ref 101–111)
Glucose, Bld: 116 mg/dL — ABNORMAL HIGH (ref 65–99)
Potassium: 3.5 mmol/L (ref 3.5–5.1)
SODIUM: 134 mmol/L — AB (ref 135–145)

## 2017-09-14 LAB — CBC
HCT: 40.2 % (ref 39.0–52.0)
Hemoglobin: 14 g/dL (ref 13.0–17.0)
MCH: 31 pg (ref 26.0–34.0)
MCHC: 34.8 g/dL (ref 30.0–36.0)
MCV: 88.9 fL (ref 78.0–100.0)
PLATELETS: 156 10*3/uL (ref 150–400)
RBC: 4.52 MIL/uL (ref 4.22–5.81)
RDW: 12.8 % (ref 11.5–15.5)
WBC: 10.4 10*3/uL (ref 4.0–10.5)

## 2017-09-14 LAB — LEGIONELLA PNEUMOPHILA SEROGP 1 UR AG: L. PNEUMOPHILA SEROGP 1 UR AG: NEGATIVE

## 2017-09-14 LAB — PROCALCITONIN

## 2017-09-14 MED ORDER — GUAIFENESIN-DM 100-10 MG/5ML PO SYRP
5.0000 mL | ORAL_SOLUTION | ORAL | Status: DC | PRN
  Administered 2017-09-15: 5 mL via ORAL
  Filled 2017-09-14: qty 5

## 2017-09-14 MED ORDER — BENZONATATE 100 MG PO CAPS
200.0000 mg | ORAL_CAPSULE | Freq: Three times a day (TID) | ORAL | Status: DC
  Administered 2017-09-14 – 2017-09-15 (×4): 200 mg via ORAL
  Filled 2017-09-14 (×4): qty 2

## 2017-09-14 MED ORDER — TRAMADOL HCL 50 MG PO TABS: 50.0000 mg | ORAL_TABLET | Freq: Four times a day (QID) | ORAL | Status: DC | PRN

## 2017-09-14 MED ORDER — FLUTICASONE PROPIONATE 50 MCG/ACT NA SUSP
1.0000 | Freq: Every day | NASAL | Status: DC
  Administered 2017-09-14 – 2017-09-15 (×2): 1 via NASAL
  Filled 2017-09-14: qty 16

## 2017-09-14 MED ORDER — FENTANYL CITRATE (PF) 100 MCG/2ML IJ SOLN: 25.0000 ug | INTRAMUSCULAR | Status: DC | PRN

## 2017-09-14 MED ORDER — LORATADINE 10 MG PO TABS
10.0000 mg | ORAL_TABLET | Freq: Every day | ORAL | Status: DC
  Administered 2017-09-14 – 2017-09-15 (×2): 10 mg via ORAL
  Filled 2017-09-14 (×2): qty 1

## 2017-09-14 NOTE — Progress Notes (Signed)
[facility]                                                                              Patient Demographics  [name], is a 66 y.o. male, DOB - January 31, 1951, [id number]  Admit date - 09/12/2017   Admitting Physician Briscoe Deutscher, MD  Outpatient Primary MD for the patient is Corwin Levins, MD  Outpatient specialists:   LOS - 2  days   Medical records reviewed and are as summarized below:    Chief Complaint  Patient presents with  . Emesis       Brief summary   Per admit note by Dr. Antionette Char on 11/7 [name] is a 66 y.o. male with medical history significant for depression, anxiety, pernicious anemia, and hypertension, now presenting to the emergency department for evaluation of fevers, nausea, vomiting, cough, and shortness of breath.  Patient was evaluated for these complaints in another emergency department 3 days earlier, was diagnosed with possible pneumonia and viral enteritis, and was discharged home with Zofran and doxycycline.  His symptoms have persisted with no appreciable improvement since that time, prompting him to return. CT chest is notable for multifocal airspace opacities in the right middle lobe, lingula, and bilateral bases, consistent with multifocal pneumonia.  CT of the abdomen and pelvis features mild fluid-filled small bowel loops without obstruction, and consistent with enteritis.  Assessment & Plan    Principal Problem:   Multifocal pneumonia -Presented with cough, dyspnea, was started on doxycycline with no significant improvement.  CT chest with multifocal airspace opacities/pneumonia.  Pro-calcitonin less than 0.1 - urine strep antigen negative, influenza panel negative. -Still coughing, placed on Tessalon Perles, Robitussin as needed Continue IV Rocephin, Zithromax-   Active Problems:   Enteritis -Presented with nausea and vomiting, currently with no diarrhea -No vomiting, still feels nauseous, is  allergic to codeine, will DC narcotics  -Wants to be on regular diet, advance diet today   Essential hypertension -BP stable, continue gentle hydration, ARB, hold HCTZ     Anxiety and depression, followed by Dr. Evelene Croon in Psych -Currently stable, no SI, HI -Continue Xanax prn     Hyponatremia -Stable, encouraged oral diet and hydration   Lactic acidosis -Resolved with IV fluids   Code Status: full  DVT Prophylaxis:  Lovenox  Family Communication: Discussed in detail with the patient, all imaging results, lab results explained to the patient and wife    Disposition Plan: Hopefully tomorrow if continues to improve  Time Spent in minutes   25  minutes  Procedures:  CT chest, abd    Consultants:   None   Antimicrobials:      Medications  Scheduled Meds: . aspirin EC  81 mg Oral Daily  . benzonatate  200 mg Oral TID  . carbamazepine  200 mg Oral BID  . fluticasone  1 spray Each Nare Daily  . gabapentin  800 mg Oral BID  . loratadine  10 mg Oral Daily  . losartan  50 mg Oral Daily  . pantoprazole  40 mg Oral Daily  . pneumococcal 23 valent vaccine  0.5 mL Intramuscular Tomorrow-1000  Continuous Infusions: . azithromycin Stopped (09/14/17 0217)  . cefTRIAXone (ROCEPHIN)  IV 1 g (09/14/17 0600)   PRN Meds:.acetaminophen, ALPRAZolam, fentaNYL (SUBLIMAZE) injection, guaiFENesin-dextromethorphan, hydrALAZINE, promethazine, promethazine, traMADol   Antibiotics   Anti-infectives (From admission, onward)   Start     Dose/Rate Route Frequency Ordered Stop   09/13/17 1130  vancomycin (VANCOCIN) IVPB 750 mg/150 ml premix  Status:  Discontinued     750 mg 150 mL/hr over 60 Minutes Intravenous Every 12 hours 09/12/17 2228 09/12/17 2258   09/13/17 0600  cefTRIAXone (ROCEPHIN) 1 g in dextrose 5 % 50 mL IVPB     1 g 100 mL/hr over 30 Minutes Intravenous Every 24 hours 09/12/17 2258 09/20/17 0559   09/13/17 0000  azithromycin (ZITHROMAX) 500 mg in dextrose 5 % 250 mL IVPB      500 mg 250 mL/hr over 60 Minutes Intravenous Every 24 hours 09/12/17 2258 09/19/17 2359   09/12/17 2230  ceFEPIme (MAXIPIME) 2 g in dextrose 5 % 50 mL IVPB  Status:  Discontinued     2 g 100 mL/hr over 30 Minutes Intravenous Every 8 hours 09/12/17 2228 09/12/17 2258   09/12/17 2230  vancomycin (VANCOCIN) 2,000 mg in sodium chloride 0.9 % 500 mL IVPB  Status:  Discontinued     2,000 mg 250 mL/hr over 120 Minutes Intravenous  Once 09/12/17 2228 09/12/17 2258        Subjective:   [name] was seen and examined today.  Coughing, dry, temp of 100.1 F overnight.  Still having some nausea but no vomiting or diarrhea.  No abdominal pain.  patient denies dizziness, chest pain, new weakness, numbess, tingling. No acute events overnight.    Objective:   Vitals:   09/13/17 1357 09/13/17 2232 09/14/17 0000 09/14/17 0635  BP: 136/75 135/72  123/66  Pulse: 81 81  83  Resp: 18 17  17   Temp: 99.2 F (37.3 C) 100.1 F (37.8 C) 98.7 F (37.1 C) 98.3 F (36.8 C)  TempSrc: Oral Oral  Oral  SpO2: 95% 96%  97%  Weight:      Height:        Intake/Output Summary (Last 24 hours) at 09/14/2017 1216 Last data filed at 09/14/2017 0944 Gross per 24 hour  Intake 696 ml  Output 400 ml  Net 296 ml     Wt Readings from Last 3 Encounters:  09/12/17 108.4 kg (238 lb 15.7 oz)  09/08/17 106.6 kg (235 lb)  07/27/17 106.6 kg (235 lb)     Exam   General: Alert and oriented x 3, NAD  Eyes:   HEENT:    Cardiovascular: S1 S2 auscultated, no mrg, RRR. No pedal edema b/l  Respiratory: Decreased breath sound and mild scattered rhonchi bilaterally  Gastrointestinal: Soft, nontender, nondistended, + bowel sounds  Ext: no pedal edema bilaterally  Neuro: no new deficit  Musculoskeletal: No digital cyanosis, clubbing  Skin: No rashes  Psych: Normal affect and demeanor, alert and oriented x3    Data Reviewed:  I have personally reviewed following labs and imaging studies  Micro  Results No results found for this or any previous visit (from the past 240 hour(s)).  Radiology Reports Dg Chest 2 View  Result Date: 09/12/2017 CLINICAL DATA:  Chest pain with nausea and vomiting. EXAM: CHEST  2 VIEW COMPARISON:  09/08/2017 and prior radiographs FINDINGS: New lingular and left lower lobe airspace disease likely represents pneumonia. The cardiomediastinal silhouette is otherwise unremarkable. Mild right basilar scarring again identified. There is  no evidence of pulmonary edema, pleural effusion or pneumothorax. IMPRESSION: Lingular and left lower lobe airspace disease likely representing pneumonia. Radiographic follow-up to resolution recommended. Electronically Signed   By: Harmon Pier M.D.   On: 09/12/2017 18:21   Ct Chest W Contrast  Result Date: 09/12/2017 CLINICAL DATA:  Chest and upper abdominal pain with nausea and vomiting x1 week. EXAM: CT CHEST, ABDOMEN, AND PELVIS WITH CONTRAST TECHNIQUE: Multidetector CT imaging of the chest, abdomen and pelvis was performed following the standard protocol during bolus administration of intravenous contrast. CONTRAST:  ISOVUE-300 IOPAMIDOL (ISOVUE-300) INJECTION 61% COMPARISON:  Same day CXR, CT abdomen and pelvis 09/08/2017 FINDINGS: CT CHEST FINDINGS Cardiovascular: Heart is normal in size with minimal coronary arteriosclerosis along the distal LAD. No pericardial effusion. Mediastinum/Nodes: Small mediastinal and hilar lymph nodes likely reactive in etiology. Trachea and mainstem bronchi are patent. Esophagus is unremarkable. No thyromegaly or mass. Lungs/Pleura: Multifocal pneumonic consolidations involving the right middle lobe, lingula and both lower lobes, left greater than right, more prominently involving the left lower lobe. Scattered pulmonary nodular densities are seen within and about the pulmonary consolidations which may be postinfectious or postinflammatory in etiology. Follow-up to complete resolution is recommended. Tiny  left effusion. Musculoskeletal: Thoracic spondylosis without acute nor suspicious osseous abnormality. CT ABDOMEN PELVIS FINDINGS Hepatobiliary: No focal hepatic lesions. No biliary dilatation. Status post cholecystectomy. Pancreas: Pancreatic atrophy without inflammation or ductal dilatation. Spleen: Normal. A few surgical clips are seen in the left upper quadrant adjacent to the spleen. Adrenals/Urinary Tract: Normal adrenal glands. Chronic stable mild perinephric fat stranding. Punctate nonobstructing renal calculus in the left upper pole. Small exophytic renal cyst is seen off the lower pole of the left kidney measuring 8 mm in diameter. No enhancing renal masses. No obstructive uropathy. Physiologic distention of the urinary bladder. Stomach/Bowel: Status post Nissen fundoplication. The stomach is nondistended. Moderate segmental fluid-filled distention of small bowel in the left upper quadrant is again re- demonstrated suggestive of mild enteritis. No mechanical source of bowel obstruction is seen. Postsurgical changes are noted along the anterior abdominal wall with herniation of the the anti mesenteric border of a tiny segment of nonobstructed small bowel in the right hemiabdomen, unchanged from prior. No associated inflammation or obstruction. Normal appendix. Moderate colonic fecal retention without colonic wall thickening. There is diverticulosis of the descending sigmoid colon without diverticulitis. Vascular/Lymphatic: Minimal aortic atherosclerosis without aneurysm or dissection. No lymphadenopathy. Reproductive: Marked enlargement of the prostate is again re- demonstrated with peripheral zone calcifications posteriorly. The prostate measures approximately 6.5 x 6 x 7 cm in transverse by AP by craniocaudad. Other: Ventral upper abdominal wall laxity is again re- demonstrated versus wide mouth ventral hernia. Musculoskeletal: Thoracolumbar degenerative changes of the spine. Mild bilateral hip joint  space narrowing. No acute nor suspicious osseous abnormality. IMPRESSION: Chest: 1. Multifocal airspace opacities in the right middle lobe, lingula and both lower lobes consistent multifocal pneumonia. Probable postinfectious or postinflammatory nodular densities are seen among the areas of pulmonary consolidation. Trace left effusion. 2. Minimal coronary arteriosclerosis. 3. Mild reactive mediastinal and hilar lymph nodes without pathologic enlargement. Abdomen and pelvis: 1. Mild fluid-filled small bowel loops without mechanical obstruction consistent with enteritis. 2. Unchanged anterior abdominal wall laxity involving the upper abdomen versus wide-mouth ventral hernia. No bowel obstruction is seen though there are too large bowel loops seen in this area of presumed laxity. 3. Nonobstructing Richter's type hernia containing small bowel in the right hemiabdomen. 4. Marked prostatomegaly. 5. Stable nonobstructing left  upper pole renal calculus and left lower pole renal cyst. 6. Colonic diverticulosis without acute diverticulitis. Electronically Signed   By: Tollie Eth M.D.   On: 09/12/2017 21:57   Ct Abdomen Pelvis W Contrast  Result Date: 09/12/2017 CLINICAL DATA:  Chest and upper abdominal pain with nausea and vomiting x1 week. EXAM: CT CHEST, ABDOMEN, AND PELVIS WITH CONTRAST TECHNIQUE: Multidetector CT imaging of the chest, abdomen and pelvis was performed following the standard protocol during bolus administration of intravenous contrast. CONTRAST:  ISOVUE-300 IOPAMIDOL (ISOVUE-300) INJECTION 61% COMPARISON:  Same day CXR, CT abdomen and pelvis 09/08/2017 FINDINGS: CT CHEST FINDINGS Cardiovascular: Heart is normal in size with minimal coronary arteriosclerosis along the distal LAD. No pericardial effusion. Mediastinum/Nodes: Small mediastinal and hilar lymph nodes likely reactive in etiology. Trachea and mainstem bronchi are patent. Esophagus is unremarkable. No thyromegaly or mass. Lungs/Pleura:  Multifocal pneumonic consolidations involving the right middle lobe, lingula and both lower lobes, left greater than right, more prominently involving the left lower lobe. Scattered pulmonary nodular densities are seen within and about the pulmonary consolidations which may be postinfectious or postinflammatory in etiology. Follow-up to complete resolution is recommended. Tiny left effusion. Musculoskeletal: Thoracic spondylosis without acute nor suspicious osseous abnormality. CT ABDOMEN PELVIS FINDINGS Hepatobiliary: No focal hepatic lesions. No biliary dilatation. Status post cholecystectomy. Pancreas: Pancreatic atrophy without inflammation or ductal dilatation. Spleen: Normal. A few surgical clips are seen in the left upper quadrant adjacent to the spleen. Adrenals/Urinary Tract: Normal adrenal glands. Chronic stable mild perinephric fat stranding. Punctate nonobstructing renal calculus in the left upper pole. Small exophytic renal cyst is seen off the lower pole of the left kidney measuring 8 mm in diameter. No enhancing renal masses. No obstructive uropathy. Physiologic distention of the urinary bladder. Stomach/Bowel: Status post Nissen fundoplication. The stomach is nondistended. Moderate segmental fluid-filled distention of small bowel in the left upper quadrant is again re- demonstrated suggestive of mild enteritis. No mechanical source of bowel obstruction is seen. Postsurgical changes are noted along the anterior abdominal wall with herniation of the the anti mesenteric border of a tiny segment of nonobstructed small bowel in the right hemiabdomen, unchanged from prior. No associated inflammation or obstruction. Normal appendix. Moderate colonic fecal retention without colonic wall thickening. There is diverticulosis of the descending sigmoid colon without diverticulitis. Vascular/Lymphatic: Minimal aortic atherosclerosis without aneurysm or dissection. No lymphadenopathy. Reproductive: Marked  enlargement of the prostate is again re- demonstrated with peripheral zone calcifications posteriorly. The prostate measures approximately 6.5 x 6 x 7 cm in transverse by AP by craniocaudad. Other: Ventral upper abdominal wall laxity is again re- demonstrated versus wide mouth ventral hernia. Musculoskeletal: Thoracolumbar degenerative changes of the spine. Mild bilateral hip joint space narrowing. No acute nor suspicious osseous abnormality. IMPRESSION: Chest: 1. Multifocal airspace opacities in the right middle lobe, lingula and both lower lobes consistent multifocal pneumonia. Probable postinfectious or postinflammatory nodular densities are seen among the areas of pulmonary consolidation. Trace left effusion. 2. Minimal coronary arteriosclerosis. 3. Mild reactive mediastinal and hilar lymph nodes without pathologic enlargement. Abdomen and pelvis: 1. Mild fluid-filled small bowel loops without mechanical obstruction consistent with enteritis. 2. Unchanged anterior abdominal wall laxity involving the upper abdomen versus wide-mouth ventral hernia. No bowel obstruction is seen though there are too large bowel loops seen in this area of presumed laxity. 3. Nonobstructing Richter's type hernia containing small bowel in the right hemiabdomen. 4. Marked prostatomegaly. 5. Stable nonobstructing left upper pole renal calculus and left lower pole  renal cyst. 6. Colonic diverticulosis without acute diverticulitis. Electronically Signed   By: Tollie Eth M.D.   On: 09/12/2017 21:57   Ct Abdomen Pelvis W Contrast  Result Date: 09/09/2017 CLINICAL DATA:  Abdominal pain diverticulitis suspected. EXAM: CT ABDOMEN AND PELVIS WITH CONTRAST TECHNIQUE: Multidetector CT imaging of the abdomen and pelvis was performed using the standard protocol following bolus administration of intravenous contrast. CONTRAST:  ISOVUE-300 IOPAMIDOL (ISOVUE-300) INJECTION 61% COMPARISON:  Radiographs earlier this day.  CT 05/11/2015  FINDINGS: Lower chest: Ground-glass and peribronchovascular opacities in the lingula, right middle lobes, right lower and predominantly in the left lower lobe. No pleural fluid. Hepatobiliary: No focal hepatic lesion. Clips in the gallbladder fossa postcholecystectomy. No biliary dilatation. Pancreas: Parenchymal atrophy. No ductal dilatation or inflammation. Spleen: Normal in size without focal abnormality. Surgical clips at the hilum. Adrenals/Urinary Tract: Normal adrenal glands. No hydronephrosis. Mild bilateral perinephric edema, and greater on the left. Punctate nonobstructing stones in the upper left kidney. Exophytic 7 mm hypodensity from the lower left kidney with slight increased size from prior. Subtle heterogeneous left renal enhancement. Urinary bladder is minimally distended with wall thickening and stranding about the dome. Stomach/Bowel: Post Nissen fundoplication. The stomach is nondistended. Moderate length segment of fluid-filled small bowel in the left upper quadrant with bowel wall thickening, hyperemia, perienteric inflammation, mesenteric edema and small amount of free fluid. No abrupt transition. More distal small bowel is decompressed. Postsurgical changes of the anterior abdominal wall with herniation of anti mesenteric border of small bowel in the right abdomen, unchanged from prior exam without associated inflammation or obstruction. Normal appendix. Moderate colonic stool burden without colonic wall thickening. Diverticulosis of the descending and sigmoid colon without diverticulitis. Vascular/Lymphatic: Minimal aortic atherosclerosis. Retroaortic left renal vein. No abdominal or pelvic adenopathy. Reproductive: Marked prostatic enlargement causing mass effect on the bladder base. Prostate gland measures 6.8 x 6.0 x 7.6 cm (volume = 160 cm^3). There scattered calcifications. Other: Mesenteric edema and free fluid in the region of small bowel inflammation. No free air. No intra-abdominal  abscess. Postsurgical change of the anterior abdominal wall, mild herniation of transverse colon through wide necked hernia versus abdominal wall laxity in the upper abdomen, similar to prior. Musculoskeletal: There are no acute or suspicious osseous abnormalities. There is degenerative change in the spine. IMPRESSION: 1. Moderate length segment of small bowel inflammation in the left abdomen consistent with enteritis, may be infectious or inflammatory. Free fluid but no perforation or free air. 2. Ground-glass and peribronchovascular opacities in the visualized lung bases, greater in the left lung, infectious etiology favored over pulmonary edema. This is not well seen radiographically. 3. Unchanged appearance of the anterior abdominal wall with wide necked upper ventral hernia versus abdominal wall laxity and small right lateral abdominal wall hernia containing small knuckle of small bowel. No associated inflammation or obstruction. 4. Colonic diverticulosis without diverticulitis. 5. Marked prostate enlargement. Mild bladder wall thickening may be due to chronic bladder outlet obstruction, however there is mild perinephric edema and possible heterogeneous left renal enhancement raising concern for urinary tract infection. Recommend correlation with urinalysis. 6. Nonobstructing left renal stones. 7.  Aortic Atherosclerosis (ICD10-I70.0). Electronically Signed   By: Rubye Oaks M.D.   On: 09/09/2017 00:08   Dg Abd 2 Views  Result Date: 09/12/2017 CLINICAL DATA:  Hernia EXAM: ABDOMEN - 2 VIEW COMPARISON:  09/08/2017 FINDINGS: No free air beneath the diaphragm. Surgical clips in the upper quadrants. Surgical changes in the left abdomen. Increased gaseous enlargement  of central and left abdominal small bowel loops, measuring up to 4.8 cm. Colon gas is present. IMPRESSION: Increased gaseous enlargement of central and left abdominal small bowel with colon gas present; findings could be secondary to ileus or a  partial/developing bowel obstruction. Electronically Signed   By: Jasmine Pang M.D.   On: 09/12/2017 18:22   Dg Abd Acute W/chest  Result Date: 09/08/2017 CLINICAL DATA:  Initial evaluation for acute nausea, vomiting. EXAM: DG ABDOMEN ACUTE W/ 1V CHEST COMPARISON:  Prior CT from 02/19/2013. FINDINGS: Mild cardiomegaly.  Mediastinal silhouette normal. Lungs hypoinflated. Associated bibasilar atelectasis. No focal infiltrates. No pulmonary edema or pleural effusion. No pneumothorax. Bowel gas pattern within normal limits without evidence for obstruction or ileus. No air-fluid levels on upright projection. No free air. No soft tissue mass or abnormal calcifications. Sequelae of prior cholecystectomy. Multiple surgical clips overlie the left upper quadrant. Hernia tacks present within left lower quadrant. No acute osseus abnormality. Degenerative changes about the hips bilaterally and within the lower lumbar spine. IMPRESSION: 1. Nonobstructive bowel gas pattern with no radiographic evidence for acute intra-abdominal pathology. 2. Shallow lung inflation with associated bibasilar atelectasis. No other active cardiopulmonary disease. Electronically Signed   By: Rise Mu M.D.   On: 09/08/2017 22:43    Lab Data:  CBC: Recent Labs  Lab 09/08/17 2125 09/12/17 1117 09/13/17 0018 09/14/17 0322  WBC 16.8* 9.2 9.2 10.4  NEUTROABS  --   --  5.8  --   HGB 17.8* 14.6 14.8 14.0  HCT 50.1 41.7 42.4 40.2  MCV 89.0 89.1 89.1 88.9  PLT 176 150 142* 156   Basic Metabolic Panel: Recent Labs  Lab 09/08/17 2125 09/12/17 1117 09/13/17 0018 09/14/17 0322  NA 138 133* 134* 134*  K 4.0 4.1 3.6 3.5  CL 97* 96* 97* 100*  CO2 30 29 27 27   GLUCOSE 138* 127* 107* 116*  BUN 12 6 6 6   CREATININE 1.12 0.78 0.78 0.78  CALCIUM 9.3 8.6* 8.5* 8.3*   GFR: Estimated Creatinine Clearance: 115.2 mL/min (by C-G formula based on SCr of 0.78 mg/dL). Liver Function Tests: Recent Labs  Lab 09/08/17 2125  09/12/17 1117  AST 23 21  ALT 18 19  ALKPHOS 74 57  BILITOT 0.9 1.1  PROT 7.4 6.7  ALBUMIN 4.2 3.4*   Recent Labs  Lab 09/08/17 2125 09/12/17 1117  LIPASE 22 19   No results for input(s): AMMONIA in the last 168 hours. Coagulation Profile: No results for input(s): INR, PROTIME in the last 168 hours. Cardiac Enzymes: No results for input(s): CKTOTAL, CKMB, CKMBINDEX, TROPONINI in the last 168 hours. BNP (last 3 results) No results for input(s): PROBNP in the last 8760 hours. HbA1C: No results for input(s): HGBA1C in the last 72 hours. CBG: No results for input(s): GLUCAP in the last 168 hours. Lipid Profile: No results for input(s): CHOL, HDL, LDLCALC, TRIG, CHOLHDL, LDLDIRECT in the last 72 hours. Thyroid Function Tests: No results for input(s): TSH, T4TOTAL, FREET4, T3FREE, THYROIDAB in the last 72 hours. Anemia Panel: No results for input(s): VITAMINB12, FOLATE, FERRITIN, TIBC, IRON, RETICCTPCT in the last 72 hours. Urine analysis:    Component Value Date/Time   COLORURINE YELLOW 09/12/2017 1203   APPEARANCEUR CLEAR 09/12/2017 1203   LABSPEC 1.015 09/12/2017 1203   PHURINE 9.0 (H) 09/12/2017 1203   GLUCOSEU NEGATIVE 09/12/2017 1203   GLUCOSEU NEGATIVE 01/18/2017 1020   HGBUR NEGATIVE 09/12/2017 1203   BILIRUBINUR NEGATIVE 09/12/2017 1203   BILIRUBINUR negative 05/11/2011 1528  KETONESUR NEGATIVE 09/12/2017 1203   PROTEINUR NEGATIVE 09/12/2017 1203   UROBILINOGEN 0.2 01/18/2017 1020   NITRITE NEGATIVE 09/12/2017 1203   LEUKOCYTESUR NEGATIVE 09/12/2017 1203     Tajae Maiolo M.D. [facility] 09/14/2017, 12:16 PM  Pager: [phone] Between 7am to 7pm - call Pager - [phone]  After 7pm go to [email or web address] - password [email or web address]  Call night coverage person covering after 7pm

## 2017-09-14 NOTE — Evaluation (Signed)
Physical Therapy Evaluation Patient Details Name: [name] MRN: [id number] DOB: 04/25/1951 Today's Date: 09/14/2017   History of Present Illness  66 y.o. male with medical history significant for depression, anxiety, pernicious anemia, and hypertension, now presenting to the emergency department for evaluation of fevers, nausea, vomiting, cough, and shortness of breath.    Clinical Impression  PT eval complete. Pt is independent with all functional mobility. Pt encouraged to ambulate in hallway as tolerated. No further PT intervention indicated. PT signing off.    Follow Up Recommendations No PT follow up;Supervision - Intermittent    Equipment Recommendations  None recommended by PT    Recommendations for Other Services       Precautions / Restrictions Precautions Precautions: None      Mobility  Bed Mobility Overal bed mobility: Independent                Transfers Overall transfer level: Independent Equipment used: None                Ambulation/Gait Ambulation/Gait assistance: Independent Ambulation Distance (Feet): 350 Feet Assistive device: None Gait Pattern/deviations: WFL(Within Functional Limits)   Gait velocity interpretation: >2.62 ft/sec, indicative of independent community ambulator    Stairs Stairs: Yes Stairs assistance: Modified independent (Device/Increase time) Stair Management: One rail Right;Forwards Number of Stairs: 12    Wheelchair Mobility    Modified Rankin (Stroke Patients Only)       Balance Overall balance assessment: No apparent balance deficits (not formally assessed)                                           Pertinent Vitals/Pain Pain Assessment: No/denies pain    Home Living Family/patient expects to be discharged to:: Private residence Living Arrangements: Spouse/significant other Available Help at Discharge: Family;Available 24 hours/day Type of Home: House Home Access:  Stairs to enter Entrance Stairs-Rails: Psychiatric nurse of Steps: 3 Home Layout: One level Home Equipment: None      Prior Function Level of Independence: Independent               Hand Dominance        Extremity/Trunk Assessment   Upper Extremity Assessment Upper Extremity Assessment: Overall WFL for tasks assessed    Lower Extremity Assessment Lower Extremity Assessment: Overall WFL for tasks assessed    Cervical / Trunk Assessment Cervical / Trunk Assessment: Normal  Communication   Communication: No difficulties  Cognition Arousal/Alertness: Awake/alert Behavior During Therapy: WFL for tasks assessed/performed Overall Cognitive Status: Within Functional Limits for tasks assessed                                        General Comments      Exercises     Assessment/Plan    PT Assessment Patent does not need any further PT services  PT Problem List         PT Treatment Interventions      PT Goals (Current goals can be found in the Care Plan section)  Acute Rehab PT Goals Patient Stated Goal: home PT Goal Formulation: All assessment and education complete, DC therapy    Frequency     Barriers to discharge        Co-evaluation  AM-PAC PT "6 Clicks" Daily Activity  Outcome Measure Difficulty turning over in bed (including adjusting bedclothes, sheets and blankets)?: None Difficulty moving from lying on back to sitting on the side of the bed? : None Difficulty sitting down on and standing up from a chair with arms (e.g., wheelchair, bedside commode, etc,.)?: None Help needed moving to and from a bed to chair (including a wheelchair)?: None Help needed walking in hospital room?: None Help needed climbing 3-5 steps with a railing? : None 6 Click Score: 24    End of Session   Activity Tolerance: Patient tolerated treatment well Patient left: in bed;with call bell/phone within reach;with  family/visitor present Nurse Communication: Mobility status PT Visit Diagnosis: Difficulty in walking, not elsewhere classified (R26.2)    Time: 1044-1100 PT Time Calculation (min) (ACUTE ONLY): 16 min   Charges:   PT Evaluation $PT Eval Low Complexity: 1 Low     PT G Codes:        Lorrin Goodell, PT  Office # [phone] Pager [phone]   Lorriane Shire 09/14/2017, 11:20 AM

## 2017-09-15 LAB — CBC
HCT: 39.4 % (ref 39.0–52.0)
Hemoglobin: 13.5 g/dL (ref 13.0–17.0)
MCH: 30.4 pg (ref 26.0–34.0)
MCHC: 34.3 g/dL (ref 30.0–36.0)
MCV: 88.7 fL (ref 78.0–100.0)
PLATELETS: 170 10*3/uL (ref 150–400)
RBC: 4.44 MIL/uL (ref 4.22–5.81)
RDW: 12.6 % (ref 11.5–15.5)
WBC: 9.2 10*3/uL (ref 4.0–10.5)

## 2017-09-15 LAB — BASIC METABOLIC PANEL
Anion gap: 7 (ref 5–15)
BUN: 7 mg/dL (ref 6–20)
CALCIUM: 8.3 mg/dL — AB (ref 8.9–10.3)
CO2: 26 mmol/L (ref 22–32)
CREATININE: 0.78 mg/dL (ref 0.61–1.24)
Chloride: 101 mmol/L (ref 101–111)
Glucose, Bld: 102 mg/dL — ABNORMAL HIGH (ref 65–99)
Potassium: 3.3 mmol/L — ABNORMAL LOW (ref 3.5–5.1)
SODIUM: 134 mmol/L — AB (ref 135–145)

## 2017-09-15 MED ORDER — AZITHROMYCIN 500 MG PO TABS: 500.0000 mg | ORAL_TABLET | Freq: Every day | ORAL | 0 refills | Status: DC

## 2017-09-15 MED ORDER — BENZONATATE 200 MG PO CAPS: 200.0000 mg | ORAL_CAPSULE | Freq: Three times a day (TID) | ORAL | 0 refills | Status: DC | PRN

## 2017-09-15 MED ORDER — AZITHROMYCIN 500 MG PO TABS
500.0000 mg | ORAL_TABLET | Freq: Every day | ORAL | Status: DC
  Administered 2017-09-15: 500 mg via ORAL
  Filled 2017-09-15: qty 1

## 2017-09-15 MED ORDER — POTASSIUM CHLORIDE CRYS ER 20 MEQ PO TBCR
40.0000 meq | EXTENDED_RELEASE_TABLET | Freq: Once | ORAL | Status: AC
  Administered 2017-09-15: 40 meq via ORAL
  Filled 2017-09-15: qty 2

## 2017-09-15 MED ORDER — CEFPODOXIME PROXETIL 200 MG PO TABS
200.0000 mg | ORAL_TABLET | Freq: Two times a day (BID) | ORAL | Status: DC
  Administered 2017-09-15: 200 mg via ORAL
  Filled 2017-09-15: qty 1

## 2017-09-15 MED ORDER — GUAIFENESIN-DM 100-10 MG/5ML PO SYRP: 5.0000 mL | ORAL_SOLUTION | ORAL | 0 refills | Status: DC | PRN

## 2017-09-15 MED ORDER — FLUTICASONE PROPIONATE 50 MCG/ACT NA SUSP: 1.0000 | Freq: Every day | NASAL | 2 refills | Status: DC | PRN

## 2017-09-15 MED ORDER — ONDANSETRON HCL 4 MG PO TABS: 4.0000 mg | ORAL_TABLET | Freq: Four times a day (QID) | ORAL | 0 refills | Status: DC | PRN

## 2017-09-15 MED ORDER — CEFPODOXIME PROXETIL 200 MG PO TABS: 200.0000 mg | ORAL_TABLET | Freq: Two times a day (BID) | ORAL | 0 refills | Status: DC

## 2017-09-15 MED ORDER — LORATADINE 10 MG PO TABS: 10.0000 mg | ORAL_TABLET | Freq: Every day | ORAL | 1 refills | Status: DC

## 2017-09-15 NOTE — Care Management Note (Signed)
Case Management Note  Patient Details  Name: [name] MRN: [id number] Date of Birth: 09-Oct-1951  Subjective/Objective:    Pt admitted with emesis and SOB - possible PNA                Action/Plan:   PTA independent from home with wife.  Pt has PCP and denied barriers with obtaining and paying for medications as prescribed.  No CM needs determined, discharge order written,  CM signing off.   Expected Discharge Date:  09/15/17               Expected Discharge Plan:  Home/Self Care  In-House Referral:     Discharge planning Services  CM Consult  Post Acute Care Choice:    Choice offered to:     DME Arranged:    DME Agency:     HH Arranged:    HH Agency:     Status of Service:  Completed, signed off  If discussed at [employer] of Stay Meetings, dates discussed:    Additional Comments:  Maryclare Labrador, RN 09/15/2017, 8:59 AM

## 2017-09-15 NOTE — Progress Notes (Signed)
[name] to be D/C'd Home per MD order. Discussed with the patient and all questions fully answered.  Allergies as of 09/15/2017      Reactions   Dilantin [phenytoin]    bradycardia   Ace Inhibitors Cough   Augmentin [amoxicillin-pot Clavulanate] Nausea And Vomiting   Codeine Nausea And Vomiting   Dilaudid [hydromorphone Hcl]    Sulfa Antibiotics    hives      Medication List    STOP taking these medications   doxycycline 100 MG capsule Commonly known as:  VIBRAMYCIN     TAKE these medications   acetaminophen 325 MG tablet Commonly known as:  TYLENOL Take 650 mg by mouth every 6 (six) hours as needed (pain).   ALPRAZolam 1 MG tablet Commonly known as:  XANAX Take 1 mg by mouth at bedtime as needed for anxiety.   aspirin EC 81 MG tablet Take 1 tablet (81 mg total) by mouth daily.   azithromycin 500 MG tablet Commonly known as:  ZITHROMAX Take 1 tablet (500 mg total) daily by mouth. With food for 7 days   benzonatate 200 MG capsule Commonly known as:  TESSALON Take 1 capsule (200 mg total) 3 (three) times daily as needed by mouth for cough.   carbamazepine 200 MG tablet Commonly known as:  TEGRETOL Take 200 mg 2 (two) times daily by mouth.   cefpodoxime 200 MG tablet Commonly known as:  VANTIN Take 1 tablet (200 mg total) 2 (two) times daily by mouth. X 7 days   cyanocobalamin 1000 MCG/ML injection Commonly known as:  (VITAMIN B-12) Inject 1 mL (1,000 mcg total) into the skin every 30 (thirty) days.   fluticasone 50 MCG/ACT nasal spray Commonly known as:  FLONASE Place 1 spray daily as needed into both nostrils for allergies or rhinitis.   gabapentin 400 MG capsule Commonly known as:  NEURONTIN Take 800 mg 2 (two) times daily by mouth.   guaiFENesin-dextromethorphan 100-10 MG/5ML syrup Commonly known as:  ROBITUSSIN DM Take 5 mLs every 4 (four) hours as needed by mouth for cough.   hydrocortisone 2.5 % cream Apply 1 application 2 (two) times  daily as needed topically (TO NOSE).   loratadine 10 MG tablet Commonly known as:  CLARITIN Take 1 tablet (10 mg total) daily by mouth. For allergies Start taking on:  09/16/2017   losartan-hydrochlorothiazide 50-12.5 MG tablet Commonly known as:  HYZAAR Take 1 tablet by mouth daily.   NEXIUM 40 MG capsule Generic drug:  esomeprazole Take 1 capsule (40 mg total) by mouth daily.   ondansetron 4 MG tablet Commonly known as:  ZOFRAN Take 1 tablet (4 mg total) every 6 (six) hours as needed by mouth.   polyethylene glycol powder powder Commonly known as:  GLYCOLAX/MIRALAX DISSOLVE 1 CAPFUL IN LIQUID EVERY DAY AS NEEDED FOR CONSTIPATION What changed:  See the new instructions.       VVS, Skin clean, dry and intact without evidence of skin break down, no evidence of skin tears noted.  IV catheter discontinued intact. Site without signs and symptoms of complications. Dressing and pressure applied.  An After Visit Summary was printed and given to the patient.  Patient escorted via [place], and D/C home via private auto.  [name]  09/15/2017

## 2017-09-15 NOTE — Discharge Summary (Signed)
Physician Discharge Summary   Patient ID: [name] MRN: [id number] DOB/AGE: 65-19-1952 65 y.o.  Admit date: 09/12/2017 Discharge date: 09/15/2017  Primary Care Physician:  [name], MD  Discharge Diagnoses:    . Essential hypertension . Anxiety and depression, followed by Dr. [name] in [facility] . Multifocal pneumonia . Enteritis . Hyponatremia . CAP (community acquired pneumonia)   Consults: None  Recommendations for Outpatient Follow-up:  1. Recommend outpatient chest x-ray in 4 weeks to ensure complete resolution of pneumonia 2. Please repeat CBC/BMET at next visit   DIET: Heart healthy diet    Allergies:   Allergies  Allergen Reactions  . Dilantin [Phenytoin]     bradycardia  . Ace Inhibitors Cough  . Augmentin [Amoxicillin-Pot Clavulanate] Nausea And Vomiting  . Codeine Nausea And Vomiting  . Dilaudid [Hydromorphone Hcl]   . Sulfa Antibiotics     hives     DISCHARGE MEDICATIONS: Current Discharge Medication List    START taking these medications   Details  azithromycin (ZITHROMAX) 500 MG tablet Take 1 tablet (500 mg total) daily by mouth. With food for 7 days Qty: 7 tablet, Refills: 0    benzonatate (TESSALON) 200 MG capsule Take 1 capsule (200 mg total) 3 (three) times daily as needed by mouth for cough. Qty: 30 capsule, Refills: 0    cefpodoxime (VANTIN) 200 MG tablet Take 1 tablet (200 mg total) 2 (two) times daily by mouth. X 7 days Qty: 14 tablet, Refills: 0    fluticasone (FLONASE) 50 MCG/ACT nasal spray Place 1 spray daily as needed into both nostrils for allergies or rhinitis. Qty: 16 g, Refills: 2    guaiFENesin-dextromethorphan (ROBITUSSIN DM) 100-10 MG/5ML syrup Take 5 mLs every 4 (four) hours as needed by mouth for cough. Qty: 118 mL, Refills: 0    loratadine (CLARITIN) 10 MG tablet Take 1 tablet (10 mg total) daily by mouth. For allergies Qty: 30 tablet, Refills: 1      CONTINUE these medications which have CHANGED    Details  ondansetron (ZOFRAN) 4 MG tablet Take 1 tablet (4 mg total) every 6 (six) hours as needed by mouth. Qty: 20 tablet, Refills: 0      CONTINUE these medications which have NOT CHANGED   Details  acetaminophen (TYLENOL) 325 MG tablet Take 650 mg by mouth every 6 (six) hours as needed (pain).    ALPRAZolam (XANAX) 1 MG tablet Take 1 mg by mouth at bedtime as needed for anxiety.     aspirin EC 81 MG tablet Take 1 tablet (81 mg total) by mouth daily. Qty: 90 tablet, Refills: 11    carbamazepine (TEGRETOL) 200 MG tablet Take 200 mg 2 (two) times daily by mouth. Refills: 2    cyanocobalamin (,VITAMIN B-12,) 1000 MCG/ML injection Inject 1 mL (1,000 mcg total) into the skin every 30 (thirty) days. Qty: 1 mL, Refills: 11    gabapentin (NEURONTIN) 400 MG capsule Take 800 mg 2 (two) times daily by mouth.     hydrocortisone 2.5 % cream Apply 1 application 2 (two) times daily as needed topically (TO NOSE).  Refills: 0    losartan-hydrochlorothiazide (HYZAAR) 50-12.5 MG tablet Take 1 tablet by mouth daily. Qty: 90 tablet, Refills: 2    NEXIUM 40 MG capsule Take 1 capsule (40 mg total) by mouth daily. Qty: 90 capsule, Refills: 3    polyethylene glycol powder (GLYCOLAX/MIRALAX) powder DISSOLVE 1 CAPFUL IN LIQUID EVERY DAY AS NEEDED FOR CONSTIPATION Qty: 527 g, Refills: 2  STOP taking these medications     doxycycline (VIBRAMYCIN) 100 MG capsule          Brief H and P: For complete details please refer to admission H and P, but in brief Per admit note by Dr. Myna Hidalgo on 11/7 [name] a 66 y.o.malewith medical history significant fordepression, anxiety, pernicious anemia, and hypertension, now presenting to the emergency department for evaluation of fevers, nausea, vomiting, cough, and shortness of breath. Patient was evaluated for these complaints in another emergency department 3 days earlier, was diagnosed with possible pneumonia and viral enteritis, and was  discharged home with Zofran and doxycycline. His symptoms have persisted with no appreciable improvement since that time, prompting him to return. CT chest is notable for multifocal airspace opacities in the right middle lobe, lingula, and bilateral bases, consistent with multifocal pneumonia.  CT of the abdomen and pelvis features mild fluid-filled small bowel loops without obstruction, and consistent with enteritis.    Hospital Course:   Multifocal pneumonia - Presented with cough, dyspnea, was started on doxycycline with no significant improvement.  CT chest showed multifocal airspace opacities/pneumonia.  Pro-calcitonin less than 0.1 - urine strep antigen negative, influenza panel negative.  Urine Legionella antigen negative -Patient was placed on IV Rocephin and Zithromax, transitioned to oral Zithromax and Vantin for 7 days     Enteritis -Presented with nausea and vomiting, currently with no diarrhea -No vomiting, still feels nauseous, is allergic to codeine, will DC narcotics  -Tolerating regular diet  Essential hypertension -BP improving, resume outpatient antihypertensives    Anxiety and depression, followed by Dr. [name] in [facility] -Currently stable, no SI, HI -Continue Xanax prn     Hyponatremia -Stable, encouraged oral diet and hydration   Lactic acidosis -Resolved with IV fluids     Day of Discharge BP 116/65 (BP Location: Right Arm)   Pulse 73   Temp 98.5 F (36.9 C) (Oral)   Resp 18   Ht 5\' 11"  (1.803 m)   Wt 108.4 kg (238 lb 15.7 oz)   SpO2 93%   BMI 33.33 kg/m   Physical Exam: General: Alert and awake oriented x3 not in any acute distress. HEENT: anicteric sclera, pupils reactive to light and accommodation CVS: S1-S2 clear no murmur rubs or gallops Chest: clear to auscultation bilaterally, no wheezing rales or rhonchi Abdomen: soft nontender, nondistended, normal bowel sounds Extremities: no cyanosis, clubbing or edema noted bilaterally Neuro:  Cranial nerves II-XII intact, no focal neurological deficits   The results of significant diagnostics from this hospitalization (including imaging, microbiology, ancillary and laboratory) are listed below for reference.    LAB RESULTS: Basic Metabolic Panel: Recent Labs  Lab 09/14/17 0322 09/15/17 0317  NA 134* 134*  K 3.5 3.3*  CL 100* 101  CO2 27 26  GLUCOSE 116* 102*  BUN 6 7  CREATININE 0.78 0.78  CALCIUM 8.3* 8.3*   Liver Function Tests: Recent Labs  Lab 09/08/17 2125 09/12/17 1117  AST 23 21  ALT 18 19  ALKPHOS 74 57  BILITOT 0.9 1.1  PROT 7.4 6.7  ALBUMIN 4.2 3.4*   Recent Labs  Lab 09/08/17 2125 09/12/17 1117  LIPASE 22 19   No results for input(s): AMMONIA in the last 168 hours. CBC: Recent Labs  Lab 09/13/17 0018 09/14/17 0322 09/15/17 0317  WBC 9.2 10.4 9.2  NEUTROABS 5.8  --   --   HGB 14.8 14.0 13.5  HCT 42.4 40.2 39.4  MCV 89.1 88.9 88.7  PLT  142* 156 170   Cardiac Enzymes: No results for input(s): CKTOTAL, CKMB, CKMBINDEX, TROPONINI in the last 168 hours. BNP: Invalid input(s): POCBNP CBG: No results for input(s): GLUCAP in the last 168 hours.  Significant Diagnostic Studies:  Dg Chest 2 View  Result Date: 09/12/2017 CLINICAL DATA:  Chest pain with nausea and vomiting. EXAM: CHEST  2 VIEW COMPARISON:  09/08/2017 and prior radiographs FINDINGS: New lingular and left lower lobe airspace disease likely represents pneumonia. The cardiomediastinal silhouette is otherwise unremarkable. Mild right basilar scarring again identified. There is no evidence of pulmonary edema, pleural effusion or pneumothorax. IMPRESSION: Lingular and left lower lobe airspace disease likely representing pneumonia. Radiographic follow-up to resolution recommended. Electronically Signed   By: Margarette Canada M.D.   On: 09/12/2017 18:21   Ct Chest W Contrast  Result Date: 09/12/2017 CLINICAL DATA:  Chest and upper abdominal pain with nausea and vomiting x1 week. EXAM: CT  CHEST, ABDOMEN, AND PELVIS WITH CONTRAST TECHNIQUE: Multidetector CT imaging of the chest, abdomen and pelvis was performed following the standard protocol during bolus administration of intravenous contrast. CONTRAST:  178mL ISOVUE-300 IOPAMIDOL (ISOVUE-300) INJECTION 61% COMPARISON:  Same day CXR, CT abdomen and pelvis 09/08/2017 FINDINGS: CT CHEST FINDINGS Cardiovascular: Heart is normal in size with minimal coronary arteriosclerosis along the distal LAD. No pericardial effusion. Mediastinum/Nodes: Small mediastinal and hilar lymph nodes likely reactive in etiology. Trachea and mainstem bronchi are patent. Esophagus is unremarkable. No thyromegaly or mass. Lungs/Pleura: Multifocal pneumonic consolidations involving the right middle lobe, lingula and both lower lobes, left greater than right, more prominently involving the left lower lobe. Scattered pulmonary nodular densities are seen within and about the pulmonary consolidations which may be postinfectious or postinflammatory in etiology. Follow-up to complete resolution is recommended. Tiny left effusion. Musculoskeletal: Thoracic spondylosis without acute nor suspicious osseous abnormality. CT ABDOMEN PELVIS FINDINGS Hepatobiliary: No focal hepatic lesions. No biliary dilatation. Status post cholecystectomy. Pancreas: Pancreatic atrophy without inflammation or ductal dilatation. Spleen: Normal. A few surgical clips are seen in the left upper quadrant adjacent to the spleen. Adrenals/Urinary Tract: Normal adrenal glands. Chronic stable mild perinephric fat stranding. Punctate nonobstructing renal calculus in the left upper pole. Small exophytic renal cyst is seen off the lower pole of the left kidney measuring 8 mm in diameter. No enhancing renal masses. No obstructive uropathy. Physiologic distention of the urinary bladder. Stomach/Bowel: Status post Nissen fundoplication. The stomach is nondistended. Moderate segmental fluid-filled distention of small bowel  in the left upper quadrant is again re- demonstrated suggestive of mild enteritis. No mechanical source of bowel obstruction is seen. Postsurgical changes are noted along the anterior abdominal wall with herniation of the the anti mesenteric border of a tiny segment of nonobstructed small bowel in the right hemiabdomen, unchanged from prior. No associated inflammation or obstruction. Normal appendix. Moderate colonic fecal retention without colonic wall thickening. There is diverticulosis of the descending sigmoid colon without diverticulitis. Vascular/Lymphatic: Minimal aortic atherosclerosis without aneurysm or dissection. No lymphadenopathy. Reproductive: Marked enlargement of the prostate is again re- demonstrated with peripheral zone calcifications posteriorly. The prostate measures approximately 6.5 x 6 x 7 cm in transverse by AP by craniocaudad. Other: Ventral upper abdominal wall laxity is again re- demonstrated versus wide mouth ventral hernia. Musculoskeletal: Thoracolumbar degenerative changes of the spine. Mild bilateral hip joint space narrowing. No acute nor suspicious osseous abnormality. IMPRESSION: Chest: 1. Multifocal airspace opacities in the right middle lobe, lingula and both lower lobes consistent multifocal pneumonia. Probable postinfectious or postinflammatory nodular  densities are seen among the areas of pulmonary consolidation. Trace left effusion. 2. Minimal coronary arteriosclerosis. 3. Mild reactive mediastinal and hilar lymph nodes without pathologic enlargement. Abdomen and pelvis: 1. Mild fluid-filled small bowel loops without mechanical obstruction consistent with enteritis. 2. Unchanged anterior abdominal wall laxity involving the upper abdomen versus wide-mouth ventral hernia. No bowel obstruction is seen though there are too large bowel loops seen in this area of presumed laxity. 3. Nonobstructing Richter's type hernia containing small bowel in the right hemiabdomen. 4. Marked  prostatomegaly. 5. Stable nonobstructing left upper pole renal calculus and left lower pole renal cyst. 6. Colonic diverticulosis without acute diverticulitis. Electronically Signed   By: Ashley Royalty M.D.   On: 09/12/2017 21:57   Ct Abdomen Pelvis W Contrast  Result Date: 09/12/2017 CLINICAL DATA:  Chest and upper abdominal pain with nausea and vomiting x1 week. EXAM: CT CHEST, ABDOMEN, AND PELVIS WITH CONTRAST TECHNIQUE: Multidetector CT imaging of the chest, abdomen and pelvis was performed following the standard protocol during bolus administration of intravenous contrast. CONTRAST:  139mL ISOVUE-300 IOPAMIDOL (ISOVUE-300) INJECTION 61% COMPARISON:  Same day CXR, CT abdomen and pelvis 09/08/2017 FINDINGS: CT CHEST FINDINGS Cardiovascular: Heart is normal in size with minimal coronary arteriosclerosis along the distal LAD. No pericardial effusion. Mediastinum/Nodes: Small mediastinal and hilar lymph nodes likely reactive in etiology. Trachea and mainstem bronchi are patent. Esophagus is unremarkable. No thyromegaly or mass. Lungs/Pleura: Multifocal pneumonic consolidations involving the right middle lobe, lingula and both lower lobes, left greater than right, more prominently involving the left lower lobe. Scattered pulmonary nodular densities are seen within and about the pulmonary consolidations which may be postinfectious or postinflammatory in etiology. Follow-up to complete resolution is recommended. Tiny left effusion. Musculoskeletal: Thoracic spondylosis without acute nor suspicious osseous abnormality. CT ABDOMEN PELVIS FINDINGS Hepatobiliary: No focal hepatic lesions. No biliary dilatation. Status post cholecystectomy. Pancreas: Pancreatic atrophy without inflammation or ductal dilatation. Spleen: Normal. A few surgical clips are seen in the left upper quadrant adjacent to the spleen. Adrenals/Urinary Tract: Normal adrenal glands. Chronic stable mild perinephric fat stranding. Punctate nonobstructing  renal calculus in the left upper pole. Small exophytic renal cyst is seen off the lower pole of the left kidney measuring 8 mm in diameter. No enhancing renal masses. No obstructive uropathy. Physiologic distention of the urinary bladder. Stomach/Bowel: Status post Nissen fundoplication. The stomach is nondistended. Moderate segmental fluid-filled distention of small bowel in the left upper quadrant is again re- demonstrated suggestive of mild enteritis. No mechanical source of bowel obstruction is seen. Postsurgical changes are noted along the anterior abdominal wall with herniation of the the anti mesenteric border of a tiny segment of nonobstructed small bowel in the right hemiabdomen, unchanged from prior. No associated inflammation or obstruction. Normal appendix. Moderate colonic fecal retention without colonic wall thickening. There is diverticulosis of the descending sigmoid colon without diverticulitis. Vascular/Lymphatic: Minimal aortic atherosclerosis without aneurysm or dissection. No lymphadenopathy. Reproductive: Marked enlargement of the prostate is again re- demonstrated with peripheral zone calcifications posteriorly. The prostate measures approximately 6.5 x 6 x 7 cm in transverse by AP by craniocaudad. Other: Ventral upper abdominal wall laxity is again re- demonstrated versus wide mouth ventral hernia. Musculoskeletal: Thoracolumbar degenerative changes of the spine. Mild bilateral hip joint space narrowing. No acute nor suspicious osseous abnormality. IMPRESSION: Chest: 1. Multifocal airspace opacities in the right middle lobe, lingula and both lower lobes consistent multifocal pneumonia. Probable postinfectious or postinflammatory nodular densities are seen among the areas of pulmonary  consolidation. Trace left effusion. 2. Minimal coronary arteriosclerosis. 3. Mild reactive mediastinal and hilar lymph nodes without pathologic enlargement. Abdomen and pelvis: 1. Mild fluid-filled small bowel  loops without mechanical obstruction consistent with enteritis. 2. Unchanged anterior abdominal wall laxity involving the upper abdomen versus wide-mouth ventral hernia. No bowel obstruction is seen though there are too large bowel loops seen in this area of presumed laxity. 3. Nonobstructing Richter's type hernia containing small bowel in the right hemiabdomen. 4. Marked prostatomegaly. 5. Stable nonobstructing left upper pole renal calculus and left lower pole renal cyst. 6. Colonic diverticulosis without acute diverticulitis. Electronically Signed   By: Ashley Royalty M.D.   On: 09/12/2017 21:57   Dg Abd 2 Views  Result Date: 09/12/2017 CLINICAL DATA:  Hernia EXAM: ABDOMEN - 2 VIEW COMPARISON:  09/08/2017 FINDINGS: No free air beneath the diaphragm. Surgical clips in the upper quadrants. Surgical changes in the left abdomen. Increased gaseous enlargement of central and left abdominal small bowel loops, measuring up to 4.8 cm. Colon gas is present. IMPRESSION: Increased gaseous enlargement of central and left abdominal small bowel with colon gas present; findings could be secondary to ileus or a partial/developing bowel obstruction. Electronically Signed   By: Donavan Foil M.D.   On: 09/12/2017 18:22    2D ECHO:   Disposition and Follow-up: Discharge Instructions    Diet - low sodium heart healthy   Complete by:  As directed    Increase activity slowly   Complete by:  As directed        DISPOSITION: Home   DISCHARGE FOLLOW-UP Follow-up Information    [name], MD. Schedule an appointment as soon as possible for a visit in 2 week(s).   Specialties:  Internal Medicine, Radiology Why:  you need follow-up chest Xray in 3-4 weeks  Contact information: [place] [address] [phone]            Time spent on Discharge: 25 minutes  Signed:   Estill Cotta M.D. [facility] 09/15/2017, 10:21 AM Pager: [phone]

## 2017-09-17 ENCOUNTER — Telehealth: Payer: Self-pay

## 2017-09-17 NOTE — Telephone Encounter (Signed)
Transition Care Management Follow-up Telephone Call   Date discharged? 09/15/2017   How have you been since you were released from the hospital? Pt states that he is feeling much better   Do you understand why you were in the hospital? Pt stated he understands.    Do you understand the discharge instructions? Pt stated that he understands the discharge instruction.    Where were you discharged to? Home   Items Reviewed:  Medications reviewed: Yes  Allergies reviewed: Yes  Dietary changes reviewed: Yes  Referrals reviewed: Yes   Functional Questionnaire:   Activities of Daily Living (ADLs):   States they are independent in the following: Pt is independent.  States they require assistance with the following: n/a   Any transportation issues/concerns?: Pt does not have any transportation concerns.   Any patient concerns? Pt does not have any concerns.    Confirmed importance and date/time of follow-up visits scheduled: Yes  Provider Appointment booked with PCP (DR. [name]) for Monday 09/24/2017  Confirmed with patient if condition begins to worsen call PCP or go to the ER: Yes Patient was given the office number and encouraged to call back with question or concerns: YES

## 2017-09-24 ENCOUNTER — Other Ambulatory Visit (INDEPENDENT_AMBULATORY_CARE_PROVIDER_SITE_OTHER): Payer: Medicare Other

## 2017-09-24 ENCOUNTER — Encounter: Payer: Self-pay | Admitting: Internal Medicine

## 2017-09-24 ENCOUNTER — Ambulatory Visit (INDEPENDENT_AMBULATORY_CARE_PROVIDER_SITE_OTHER): Payer: Medicare Other | Admitting: Internal Medicine

## 2017-09-24 VITALS — BP 138/88 | HR 70 | Temp 98.4°F | Ht 71.0 in | Wt 235.0 lb

## 2017-09-24 DIAGNOSIS — E871 Hypo-osmolality and hyponatremia: Secondary | ICD-10-CM

## 2017-09-24 DIAGNOSIS — K529 Noninfective gastroenteritis and colitis, unspecified: Secondary | ICD-10-CM

## 2017-09-24 DIAGNOSIS — J189 Pneumonia, unspecified organism: Secondary | ICD-10-CM | POA: Diagnosis not present

## 2017-09-24 LAB — CBC WITH DIFFERENTIAL/PLATELET
BASOS ABS: 0.1 10*3/uL (ref 0.0–0.1)
Basophils Relative: 1 % (ref 0.0–3.0)
EOS ABS: 0.1 10*3/uL (ref 0.0–0.7)
Eosinophils Relative: 1 % (ref 0.0–5.0)
HEMATOCRIT: 45.8 % (ref 39.0–52.0)
HEMOGLOBIN: 15.7 g/dL (ref 13.0–17.0)
LYMPHS PCT: 36.4 % (ref 12.0–46.0)
Lymphs Abs: 2.7 10*3/uL (ref 0.7–4.0)
MCHC: 34.3 g/dL (ref 30.0–36.0)
MCV: 90.8 fl (ref 78.0–100.0)
MONO ABS: 0.6 10*3/uL (ref 0.1–1.0)
Monocytes Relative: 8.5 % (ref 3.0–12.0)
Neutro Abs: 3.9 10*3/uL (ref 1.4–7.7)
Neutrophils Relative %: 53.1 % (ref 43.0–77.0)
Platelets: 263 10*3/uL (ref 150.0–400.0)
RBC: 5.05 Mil/uL (ref 4.22–5.81)
RDW: 12.7 % (ref 11.5–15.5)
WBC: 7.3 10*3/uL (ref 4.0–10.5)

## 2017-09-24 LAB — BASIC METABOLIC PANEL
BUN: 10 mg/dL (ref 6–23)
CALCIUM: 9.5 mg/dL (ref 8.4–10.5)
CO2: 32 mEq/L (ref 19–32)
CREATININE: 0.85 mg/dL (ref 0.40–1.50)
Chloride: 94 mEq/L — ABNORMAL LOW (ref 96–112)
GFR: 96.07 mL/min (ref >60.00)
Glucose, Bld: 95 mg/dL (ref 70–99)
Potassium: 4.7 mEq/L (ref 3.5–5.1)
Sodium: 133 mEq/L — ABNORMAL LOW (ref 135–145)

## 2017-09-24 NOTE — Assessment & Plan Note (Addendum)
Mild, asympt, for f/u lab today

## 2017-09-24 NOTE — Patient Instructions (Signed)
Please continue all other medications as before, and refills have been done if requested.  Please have the pharmacy call with any other refills you may need.  Please keep your appointments with your specialists as you may have planned  Please go to the XRAY Department in the Basement (go straight as you get off the elevator) for the x-ray testing  - about Dec 7  Please go to the LAB in the Basement (turn left off the elevator) for the tests to be done today  You will be contacted by phone if any changes need to be made immediately.  Otherwise, you will receive a letter about your results with an explanation, but please check with MyChart first.  Please remember to sign up for MyChart if you have not done so, as this will be important to you in the future with finding out test results, communicating by private email, and scheduling acute appointments online when needed.

## 2017-09-24 NOTE — Progress Notes (Signed)
Subjective:    Patient ID: [name], male    DOB: 28-Oct-1951, 66 y.o.   MRN: [id number]  HPI  Here to f/u recent episode enteritis associated with persistent n/v and multifocal pneumonia.  Pt denies chest pain, increased sob or doe, wheezing, orthopnea, PND, increased LE swelling, palpitations, dizziness or syncope.  Pt denies new neurological symptoms such as new headache, or facial or extremity weakness or numbness   Pt denies polydipsia, polyuria. Denies worsening reflux, abd pain, dysphagia, further n/v, bowel change or blood.  Never had diarrhea.  Also noted on labs were mild low sodium and potassium, rec'd for f/u labs today and repeat cxr at 4 wks.   Pt denies fever, cough, wt loss, night sweats, loss of appetite, or other constitutional symptoms Past Medical History:  Diagnosis Date  . Allergic rhinitis   . Anemia, pernicious   . Anxiety and depression    sees Dr. [name]  . Arthritis   . Catheter-associated urinary tract infection ([place])   . Diverticulosis of colon   . Elevated PSA    and hypogonadism, sees urologist  . Facial asymmetry   . GERD (gastroesophageal reflux disease)    hiatal hernia  . HTN (hypertension)   . Hx of blood transfusion reaction   . Hyperlipidemia   . Hyperparathyroidism ([place])   . Hyperplastic colon polyp   . Hypertension    acei causes cough  . IBS (irritable bowel syndrome)   . Nephrolithiasis   . Obstructive chronic bronchitis without exacerbation, followed by Dr. [name] 03/04/2009   ONO RA  Normal 07/28/11 6 min walk >562m no desat PFTs 08/08/11: DLCO 70% TLC 80%  FeV1 80%   Fef 25 75 56% with significant improvement after BD   . Peritonitis ([place])    after surgery in 1999  . PONV (postoperative nausea and vomiting)   . Rectal fissure   . Trigeminal neuralgia    has facial asymetry  . Ventral hernia    Past Surgical History:  Procedure Laterality Date  . ABDOMINAL SURGERY     Multiple  . BRAIN SURGERY  March 2012   Trigeminal  nerve  . CHOLECYSTECTOMY  1981  . HAND SURGERY  1973   Left  . [place]   Left  . LITHOTRIPSY     Right  . NISSEN FUNDOPLICATION  3086    complicated by gastric perforation, peritonitis, ventral nernia   . RHIZOTOMY TO RIGHT V2,V3 Right 11/17/2014   Performed by Erline Levine, MD at [facility] NEURO ORS  . Right V2 and V3 trigeminal rhysolysis Right 12/22/2014   Performed by Erline Levine, MD at [facility] NEURO ORS  . UPPER GASTROINTESTINAL ENDOSCOPY    . VENTRAL HERNIA REPAIR      reports that he quit smoking about 20 years ago. His smoking use included cigarettes. He has a 20.00 pack-year smoking history. He quit smokeless tobacco use about 33 years ago. His smokeless tobacco use included chew. He reports that he does not drink alcohol or use drugs. family history includes Clotting disorder in his brother; Coronary artery disease in his mother; Diabetes in his brother, mother, and sister; Hypertension in his brother. Allergies  Allergen Reactions  . Dilantin [Phenytoin]     bradycardia  . Ace Inhibitors Cough  . Augmentin [Amoxicillin-Pot Clavulanate] Nausea And Vomiting  . Codeine Nausea And Vomiting  . Dilaudid [Hydromorphone Hcl]   . Sulfa Antibiotics     hives  Current Outpatient Medications on File Prior to Visit  Medication Sig Dispense Refill  . acetaminophen (TYLENOL) 325 MG tablet Take 650 mg by mouth every 6 (six) hours as needed (pain).    Marland Kitchen ALPRAZolam (XANAX) 1 MG tablet Take 1 mg by mouth at bedtime as needed for anxiety.     Marland Kitchen aspirin EC 81 MG tablet Take 1 tablet (81 mg total) by mouth daily. 90 tablet 11  . carbamazepine (TEGRETOL) 200 MG tablet Take 200 mg 2 (two) times daily by mouth.  2  . cyanocobalamin (,VITAMIN B-12,) 1000 MCG/ML injection Inject 1 mL (1,000 mcg total) into the skin every 30 (thirty) days. 1 mL 11  . fluticasone (FLONASE) 50 MCG/ACT nasal spray Place 1 spray daily as needed into both nostrils for allergies or rhinitis. 16 g 2   . gabapentin (NEURONTIN) 400 MG capsule Take 800 mg 2 (two) times daily by mouth.     Marland Kitchen guaiFENesin-dextromethorphan (ROBITUSSIN DM) 100-10 MG/5ML syrup Take 5 mLs every 4 (four) hours as needed by mouth for cough. 118 mL 0  . hydrocortisone 2.5 % cream Apply 1 application 2 (two) times daily as needed topically (TO NOSE).   0  . loratadine (CLARITIN) 10 MG tablet Take 1 tablet (10 mg total) daily by mouth. For allergies 30 tablet 1  . losartan-hydrochlorothiazide (HYZAAR) 50-12.5 MG tablet Take 1 tablet by mouth daily. 90 tablet 2  . NEXIUM 40 MG capsule Take 1 capsule (40 mg total) by mouth daily. 90 capsule 3  . ondansetron (ZOFRAN) 4 MG tablet Take 1 tablet (4 mg total) every 6 (six) hours as needed by mouth. 20 tablet 0  . polyethylene glycol powder (GLYCOLAX/MIRALAX) powder DISSOLVE 1 CAPFUL IN LIQUID EVERY DAY AS NEEDED FOR CONSTIPATION (Patient taking differently: DISSOLVE 1 CAPFUL IN LIQUID EVERY DAY) 527 g 2  . [DISCONTINUED] losartan (COZAAR) 50 MG tablet Take 1 tablet (50 mg total) by mouth daily. 30 tablet 6   No current facility-administered medications on file prior to visit.    Review of Systems  Constitutional: Negative for other unusual diaphoresis or sweats HENT: Negative for ear discharge or swelling Eyes: Negative for other worsening visual disturbances Respiratory: Negative for stridor or other swelling  Gastrointestinal: Negative for worsening distension or other blood Genitourinary: Negative for retention or other urinary change Musculoskeletal: Negative for other MSK pain or swelling Skin: Negative for color change or other new lesions Neurological: Negative for worsening tremors and other numbness  Psychiatric/Behavioral: Negative for worsening agitation or other fatigue All other system neg per pt    Objective:   Physical Exam BP 138/88   Pulse 70   Temp 98.4 F (36.9 C) (Oral)   Ht 5\' 11"  (1.803 m)   Wt 235 lb (106.6 kg)   SpO2 98%   BMI 32.78 kg/m    VS noted, mild ill Constitutional: Pt appears in NAD HENT: Head: NCAT.  Right Ear: External ear normal.  Left Ear: External ear normal.  Eyes: . Pupils are equal, round, and reactive to light. Conjunctivae and EOM are normal Nose: without d/c or deformity Neck: Neck supple. Gross normal ROM Cardiovascular: Normal rate and regular rhythm.   Pulmonary/Chest: Effort normal and breath sounds without rales or wheezing.  Abd:  Soft, NT, ND, + BS, no organomegaly Neurological: Pt is alert. At baseline orientation, motor grossly intact Skin: Skin is warm. No rashes, other new lesions, no LE edema Psychiatric: Pt behavior is normal without agitation  No other exam findings  Assessment & Plan:

## 2017-09-24 NOTE — Assessment & Plan Note (Signed)
clincially improved, for f/u cxr at 4 wks

## 2017-09-24 NOTE — Assessment & Plan Note (Signed)
Symptomatically improved but still with belching, pt wife requests f/u with GI

## 2017-10-01 ENCOUNTER — Telehealth: Payer: Self-pay | Admitting: Gastroenterology

## 2017-10-01 NOTE — Telephone Encounter (Signed)
Patient had been hospitalized for pneumonia. Finished his oral atb's last week. He is complaining of nausea and burping since then. He is eating small meals and avoiding spicy or fried foods. He becomes nauseated when eating. He is taking Nexium every morning. Mylanta PRN. Neither seem to be helping per patient. Appointment scheduled.

## 2017-10-02 ENCOUNTER — Ambulatory Visit (INDEPENDENT_AMBULATORY_CARE_PROVIDER_SITE_OTHER): Payer: Medicare Other | Admitting: Physician Assistant

## 2017-10-02 ENCOUNTER — Other Ambulatory Visit: Payer: Self-pay | Admitting: Emergency Medicine

## 2017-10-02 ENCOUNTER — Encounter: Payer: Self-pay | Admitting: Physician Assistant

## 2017-10-02 VITALS — BP 134/84 | HR 72 | Ht 71.0 in | Wt 237.2 lb

## 2017-10-02 DIAGNOSIS — R1013 Epigastric pain: Secondary | ICD-10-CM

## 2017-10-02 DIAGNOSIS — R12 Heartburn: Secondary | ICD-10-CM | POA: Diagnosis not present

## 2017-10-02 DIAGNOSIS — R110 Nausea: Secondary | ICD-10-CM

## 2017-10-02 MED ORDER — ONDANSETRON HCL 4 MG PO TABS: 4.0000 mg | ORAL_TABLET | Freq: Four times a day (QID) | ORAL | 0 refills | Status: DC | PRN

## 2017-10-02 MED ORDER — PANTOPRAZOLE SODIUM 40 MG PO TBEC: 40.0000 mg | DELAYED_RELEASE_TABLET | Freq: Two times a day (BID) | ORAL | 1 refills | Status: DC

## 2017-10-02 MED ORDER — SUCRALFATE 1 GM/10ML PO SUSP: 1.0000 g | Freq: Four times a day (QID) | ORAL | 1 refills | Status: DC

## 2017-10-02 NOTE — Progress Notes (Signed)
Reviewed and agree with documentation and assessment and plan. K. Veena Adana Marik , MD   

## 2017-10-02 NOTE — Progress Notes (Signed)
Chief Complaint: Abdominal pain, belching and nausea  HPI:    [name] is a 66 year old Caucasian male with a past medical history as listed below, who was referred to me by [name], MD for a complaint of abdominal pain, belching and nausea.      Patient follows with Dr. Silverio Decamp and had a colonoscopy and endoscopy 03/05/17.  Colonoscopy revealed 4 1-2 mm polyps in the rectum, sigmoid colon and transverse colon as well as a 4 mm polyp in the ascending colon along with diverticulosis in the sigmoid and descending colon, transverse colon and ascending colon and nonbleeding internal hemorrhoids.  Repeat was recommended in 3 years.  EGD showed LA grade B reflux esophagitis, large paraesophageal hernia, and was otherwise normal.    Patient was recently admitted to the hospital the beginning of November 09/12/17 for pneumonia and had a CT of the abdomen and pelvis due to some nausea and vomiting for the past week.  CT showed mild fluid-filled small bowel loops without mechanical obstruction consistent with enteritis.  Unchanged anterior abdominal wall laxity involving the upper abdomen versus widemouth ventral hernia.  No bowel obstruction was seen though there were 2 large bowel loops in this area.  Nonobstructing Richter's type hernia containing small bowel in the right hemiabdomen.  Marked prostatomegaly.  Stable nonobstructing left upper pole renal calculus and left lower pole renal cyst.  Colonic diverticulosis without diverticulitis.  Patient was on Rocephin in the hospital and placed on azithromycin 500 mg daily for 7 days at discharge for his pneumonia.    Today, the patient presents to clinic accompanied by his wife and tells me that 2 weeks ago Saturday he was admitted to the hospital, as above.  Patient tells me that the week before this he started with nausea and a large amount of vomiting which sent him to [facility].  He tells me that they did some imaging there and placed him on  antibiotics for a "pneumonia they found".  Patient tells me he got no better over the next couple of days and presented to the ER at [facility] with admission as above.  At that time he had worsening pneumonia and was placed on IV antibiotics.  Patient tells me that he had stopped vomiting but did continue with some nausea.  He has been using Zofran 4 mg as needed which helps.    Currently, the patient tells me that he has nausea which "comes in waves", this is sometimes worse in the morning and is somewhat better after eating crackers and drinking a little bit of water.  Patient also describes an epigastric/upper abdominal pain which radiates bilaterally across his abdomen and occurs throughout the day if he "eats anything", but is often worse around 6 PM.  He describes that this can increase to 8/10 pain and is "burning in nature".  Patient has been using his Nexium 40 mg once daily and has also been drinking Mylanta.  Patient tells me Mylanta does offer him a slight bit of relief, but this does not last long.  He has also drinks a cup of milk which "tends to soothe this".  His wife tells me that he burps when he is eating or drinking anything.    They describe a history of the patient being on liquid Carafate a long time ago prescribed by Dr. Olevia Perches and tell me that this helped.  The patient is never been on a prescription antesecretory other than Nexium.    He  tells me his bowel movements have been normal.    Patient denies fever, chills, blood in stool, melena, weight loss, vomiting, reflux or symptoms that awaken him at night.  Past Medical History:  Diagnosis Date  . Allergic rhinitis   . Anemia, pernicious   . Anxiety and depression    sees Dr. [name]  . Arthritis   . Catheter-associated urinary tract infection ([place])   . Diverticulosis of colon   . Elevated PSA    and hypogonadism, sees urologist  . Facial asymmetry   . GERD (gastroesophageal reflux disease)    hiatal hernia  . HTN (hypertension)    . Hx of blood transfusion reaction   . Hyperlipidemia   . Hyperparathyroidism ([place])   . Hyperplastic colon polyp   . Hypertension    acei causes cough  . IBS (irritable bowel syndrome)   . Nephrolithiasis   . Obstructive chronic bronchitis without exacerbation, followed by Dr. [name] 03/04/2009   ONO RA  Normal 07/28/11 6 min walk >573m no desat PFTs 08/08/11: DLCO 70% TLC 80%  FeV1 80%   Fef 25 75 56% with significant improvement after BD   . Peritonitis ([place])    after surgery in 1999  . Pneumonia   . PONV (postoperative nausea and vomiting)   . Rectal fissure   . Trigeminal neuralgia    has facial asymetry  . Ventral hernia     Past Surgical History:  Procedure Laterality Date  . ABDOMINAL SURGERY     Multiple  . BRAIN SURGERY  March 2012   Trigeminal nerve  . CHOLECYSTECTOMY  1981  . HAND SURGERY  1973   Left  . [place]   Left  . LITHOTRIPSY     Right  . NISSEN FUNDOPLICATION  2952    complicated by gastric perforation, peritonitis, ventral nernia   . RHIZOTOMY Right 11/17/2014   Procedure: RHIZOTOMY TO RIGHT V2,V3;  Surgeon: Erline Levine, MD;  Location: [place] NEURO ORS;  Service: Neurosurgery;  Laterality: Right;  right  . RHIZOTOMY Right 12/22/2014   Procedure: Right V2 and V3 trigeminal rhysolysis;  Surgeon: Erline Levine, MD;  Location: [place] NEURO ORS;  Service: Neurosurgery;  Laterality: Right;  Right V2 and V3 trigeminal rhysolysis  . UPPER GASTROINTESTINAL ENDOSCOPY    . VENTRAL HERNIA REPAIR      Current Outpatient Medications  Medication Sig Dispense Refill  . acetaminophen (TYLENOL) 325 MG tablet Take 650 mg by mouth every 6 (six) hours as needed (pain).    Marland Kitchen ALPRAZolam (XANAX) 1 MG tablet Take 1 mg by mouth at bedtime as needed for anxiety.     Marland Kitchen aspirin EC 81 MG tablet Take 1 tablet (81 mg total) by mouth daily. 90 tablet 11  . carbamazepine (TEGRETOL) 200 MG tablet Take 200 mg 2 (two) times daily by mouth.  2  . cyanocobalamin  (,VITAMIN B-12,) 1000 MCG/ML injection Inject 1 mL (1,000 mcg total) into the skin every 30 (thirty) days. 1 mL 11  . fluticasone (FLONASE) 50 MCG/ACT nasal spray Place 1 spray daily as needed into both nostrils for allergies or rhinitis. 16 g 2  . gabapentin (NEURONTIN) 400 MG capsule Take 800 mg 2 (two) times daily by mouth.     Marland Kitchen guaiFENesin-dextromethorphan (ROBITUSSIN DM) 100-10 MG/5ML syrup Take 5 mLs every 4 (four) hours as needed by mouth for cough. 118 mL 0  . hydrocortisone 2.5 % cream Apply 1 application 2 (two) times  daily as needed topically (TO NOSE).   0  . loratadine (CLARITIN) 10 MG tablet Take 1 tablet (10 mg total) daily by mouth. For allergies 30 tablet 1  . losartan-hydrochlorothiazide (HYZAAR) 50-12.5 MG tablet Take 1 tablet by mouth daily. 90 tablet 2  . NEXIUM 40 MG capsule Take 1 capsule (40 mg total) by mouth daily. 90 capsule 3  . ondansetron (ZOFRAN) 4 MG tablet Take 1 tablet (4 mg total) by mouth every 6 (six) hours as needed. 20 tablet 0  . polyethylene glycol powder (GLYCOLAX/MIRALAX) powder DISSOLVE 1 CAPFUL IN LIQUID EVERY DAY AS NEEDED FOR CONSTIPATION (Patient taking differently: DISSOLVE 1 CAPFUL IN LIQUID EVERY DAY) 527 g 2  . pantoprazole (PROTONIX) 40 MG tablet Take 1 tablet (40 mg total) by mouth 2 (two) times daily before a meal. 60 tablet 1  . sucralfate (CARAFATE) 1 GM/10ML suspension Take 10 mLs (1 g total) by mouth 4 (four) times daily. 420 mL 1   No current facility-administered medications for this visit.     Allergies as of 10/02/2017 - Review Complete 10/02/2017  Allergen Reaction Noted  . Dilantin [phenytoin]  07/27/2017  . Ace inhibitors Cough 04/02/2008  . Augmentin [amoxicillin-pot clavulanate] Nausea And Vomiting 07/06/2017  . Codeine Nausea And Vomiting 11/10/2014  . Dilaudid [hydromorphone hcl]  09/12/2017  . Sulfa antibiotics  12/17/2014    Family History  Problem Relation Age of Onset  . Diabetes Mother   . Coronary artery disease  Mother        CABG  . Diabetes Sister   . Diabetes Brother   . Hypertension Brother   . Clotting disorder Brother   . Colon cancer Neg Hx   . Esophageal cancer Neg Hx   . Stomach cancer Neg Hx     Social History   Socioeconomic History  . Marital status: Married    Spouse name: Not on file  . Number of children: 2  . Years of education: Not on file  . Highest education level: Not on file  Social Needs  . Financial resource strain: Not on file  . Food insecurity - worry: Not on file  . Food insecurity - inability: Not on file  . Transportation needs - medical: Not on file  . Transportation needs - non-medical: Not on file  Occupational History  . Occupation: Disabled - psych  Tobacco Use  . Smoking status: Former Smoker    Packs/day: 1.00    Years: 20.00    Pack years: 20.00    Types: Cigarettes    Last attempt to quit: 11/06/1996    Years since quitting: 20.9  . Smokeless tobacco: Former Systems developer    Types: [place] date: 11/07/1983  Substance and Sexual Activity  . Alcohol use: No  . Drug use: No  . Sexual activity: Not on file  Other Topics Concern  . Not on file  Social History Narrative   Daily Caffeine Use:  Yes          Review of Systems:    Constitutional: No weight loss, fever or chills Skin: No rash  Cardiovascular: No chest pain Respiratory: No SOB  Gastrointestinal: See HPI and otherwise negative Genitourinary: No dysuria Neurological: No headache Musculoskeletal: No new muscle or joint pain Hematologic: No bleeding  Psychiatric: No history of depression or anxiety   Physical Exam:  Vital signs: BP 134/84   Pulse 72   Ht 5\' 11"  (1.803 m)   Wt 237 lb 4 oz (  107.6 kg)   BMI 33.09 kg/m   Constitutional:   Pleasant Caucasian male appears to be in NAD, Well developed, Well nourished, alert and cooperative Respiratory: Respirations even and unlabored. Lungs clear to auscultation bilaterally.   No wheezes, crackles, or rhonchi.  Cardiovascular:  Normal S1, S2. No MRG. Regular rate and rhythm. No peripheral edema, cyanosis or pallor.  Gastrointestinal:  Soft, nondistended, mild epigastric/ supraumbilical pain, b.l to both sides of abdomen, No rebound or guarding. Normal bowel sounds. No appreciable masses or hepatomegaly. Rectal:  Not performed.  Psychiatric:  Demonstrates good judgement and reason without abnormal affect or behaviors.  MOST RECENT LABS AND IMAGING: CBC    Component Value Date/Time   WBC 7.3 09/24/2017 1641   RBC 5.05 09/24/2017 1641   HGB 15.7 09/24/2017 1641   HCT 45.8 09/24/2017 1641   PLT 263.0 09/24/2017 1641   MCV 90.8 09/24/2017 1641   MCH 30.4 09/15/2017 0317   MCHC 34.3 09/24/2017 1641   RDW 12.7 09/24/2017 1641   LYMPHSABS 2.7 09/24/2017 1641   MONOABS 0.6 09/24/2017 1641   EOSABS 0.1 09/24/2017 1641   BASOSABS 0.1 09/24/2017 1641    CMP     Component Value Date/Time   NA 133 (L) 09/24/2017 1641   K 4.7 09/24/2017 1641   CL 94 (L) 09/24/2017 1641   CO2 32 09/24/2017 1641   GLUCOSE 95 09/24/2017 1641   GLUCOSE 118 (H) 10/25/2006 1414   BUN 10 09/24/2017 1641   CREATININE 0.85 09/24/2017 1641   CREATININE 0.97 12/31/2012 1108   CALCIUM 9.5 09/24/2017 1641   CALCIUM 9.3 01/23/2012 1045   PROT 6.7 09/12/2017 1117   ALBUMIN 3.4 (L) 09/12/2017 1117   AST 21 09/12/2017 1117   ALT 19 09/12/2017 1117   ALKPHOS 57 09/12/2017 1117   BILITOT 1.1 09/12/2017 1117   GFRNONAA >60 09/15/2017 0317   GFRAA >60 09/15/2017 0317   EXAM: CT CHEST, ABDOMEN, AND PELVIS WITH CONTRAST 09/12/17  TECHNIQUE: Multidetector CT imaging of the chest, abdomen and pelvis was performed following the standard protocol during bolus administration of intravenous contrast.  CONTRAST:  194mL ISOVUE-300 IOPAMIDOL (ISOVUE-300) INJECTION 61%  COMPARISON:  Same day CXR, CT abdomen and pelvis 09/08/2017  FINDINGS: CT CHEST FINDINGS  Cardiovascular: Heart is normal in size with minimal coronary arteriosclerosis  along the distal LAD. No pericardial effusion.  Mediastinum/Nodes: Small mediastinal and hilar lymph nodes likely reactive in etiology. Trachea and mainstem bronchi are patent. Esophagus is unremarkable. No thyromegaly or mass.  Lungs/Pleura: Multifocal pneumonic consolidations involving the right middle lobe, lingula and both lower lobes, left greater than right, more prominently involving the left lower lobe. Scattered pulmonary nodular densities are seen within and about the pulmonary consolidations which may be postinfectious or postinflammatory in etiology. Follow-up to complete resolution is recommended. Tiny left effusion.  Musculoskeletal: Thoracic spondylosis without acute nor suspicious osseous abnormality.  CT ABDOMEN PELVIS FINDINGS  Hepatobiliary: No focal hepatic lesions. No biliary dilatation. Status post cholecystectomy.  Pancreas: Pancreatic atrophy without inflammation or ductal dilatation.  Spleen: Normal. A few surgical clips are seen in the left upper quadrant adjacent to the spleen.  Adrenals/Urinary Tract: Normal adrenal glands. Chronic stable mild perinephric fat stranding. Punctate nonobstructing renal calculus in the left upper pole. Small exophytic renal cyst is seen off the lower pole of the left kidney measuring 8 mm in diameter. No enhancing renal masses. No obstructive uropathy. Physiologic distention of the urinary bladder.  Stomach/Bowel: Status post Nissen fundoplication. The stomach is  nondistended. Moderate segmental fluid-filled distention of small bowel in the left upper quadrant is again re- demonstrated suggestive of mild enteritis. No mechanical source of bowel obstruction is seen. Postsurgical changes are noted along the anterior abdominal wall with herniation of the the anti mesenteric border of a tiny segment of nonobstructed small bowel in the right hemiabdomen, unchanged from prior. No associated inflammation  or obstruction. Normal appendix. Moderate colonic fecal retention without colonic wall thickening. There is diverticulosis of the descending sigmoid colon without diverticulitis.  Vascular/Lymphatic: Minimal aortic atherosclerosis without aneurysm or dissection. No lymphadenopathy.  Reproductive: Marked enlargement of the prostate is again re- demonstrated with peripheral zone calcifications posteriorly. The prostate measures approximately 6.5 x 6 x 7 cm in transverse by AP by craniocaudad.  Other: Ventral upper abdominal wall laxity is again re- demonstrated versus wide mouth ventral hernia.  Musculoskeletal: Thoracolumbar degenerative changes of the spine. Mild bilateral hip joint space narrowing. No acute nor suspicious osseous abnormality.  IMPRESSION: Chest:  1. Multifocal airspace opacities in the right middle lobe, lingula and both lower lobes consistent multifocal pneumonia. Probable postinfectious or postinflammatory nodular densities are seen among the areas of pulmonary consolidation. Trace left effusion. 2. Minimal coronary arteriosclerosis. 3. Mild reactive mediastinal and hilar lymph nodes without pathologic enlargement. Abdomen and pelvis:  1. Mild fluid-filled small bowel loops without mechanical obstruction consistent with enteritis. 2. Unchanged anterior abdominal wall laxity involving the upper abdomen versus wide-mouth ventral hernia. No bowel obstruction is seen though there are too large bowel loops seen in this area of presumed laxity. 3. Nonobstructing Richter's type hernia containing small bowel in the right hemiabdomen. 4. Marked prostatomegaly. 5. Stable nonobstructing left upper pole renal calculus and left lower pole renal cyst. 6. Colonic diverticulosis without acute diverticulitis.   Electronically Signed   By: Ashley Royalty M.D.   On: 09/12/2017 21:57  Assessment: 1.  Epigastric abdominal pain: Patient describes an  epigastric/supraumbilical pain which radiates bilaterally both sides of his abdomen, this has been worse recently after being in the hospital on antibiotics and what sounds like a viral enteritis with nausea and vomiting, somewhat relieved with Mylanta; most likely consistent with gastritis 2.  Heartburn: With above 3.  Nausea: With above  Plan: 1.  Stopped patient's Nexium.  Started Pantoprazole 40 mg twice daily, 30-60 minutes before eating breakfast and dinner #60 with 1 refill 2.  Started patient on liquid Carafate 1 g 3-4 times daily, 20-30 minutes before eating.  For 1 month with 1 refill 3.  Reviewed an antireflux diet and lifestyle modifications. 4.  Refilled patient's Zofran 4 mg every 4-6 hours as needed for nausea  5.  Patient will call our clinic on Monday if this new medication does not seem to be improving his symptoms.  If not may consider repeat imaging due to patient's history of large paraesophageal hernia as well as recent enteritis. 6.  Patient to follow in clinic with me or Dr. Silverio Decamp in the next 4-6 weeks or sooner if necessary.  Jeffrey Newer, PA-C [facility] 10/02/2017, 11:56 AM  Cc: [name], MD

## 2017-10-02 NOTE — Patient Instructions (Addendum)
Carafate suspension 1 gram 3-4 times daily for 1 month.   Stop Nexium.   Start Pantoprazole 40 mg twice a day 30-60 minutes before breakfast and lunch  Continue Zofran.

## 2017-10-04 ENCOUNTER — Ambulatory Visit: Payer: Medicare Other | Admitting: Gastroenterology

## 2017-10-08 ENCOUNTER — Telehealth: Payer: Self-pay | Admitting: Physician Assistant

## 2017-10-08 DIAGNOSIS — R142 Eructation: Secondary | ICD-10-CM

## 2017-10-08 DIAGNOSIS — R109 Unspecified abdominal pain: Secondary | ICD-10-CM

## 2017-10-08 DIAGNOSIS — R140 Abdominal distension (gaseous): Secondary | ICD-10-CM

## 2017-10-08 NOTE — Telephone Encounter (Signed)
  The pt stopped Nexium, started Pantoprazole 40 mg twice daily, 30-60 minutes before eating breakfast and dinner.  He started  liquid Carafate 1 g 4 times daily, 20-30 minutes before eating. He has been following  antireflux diet and lifestyle modifications. Despite the meds and diet changes the pt continues to have bloating and belching.  He was advised to call our clinic on Monday if this new medication does not seem to be improving his symptoms.  If not may consider repeat imaging due to patient's history of large paraesophageal hernia as well as recent enteritis. Please advise.

## 2017-10-08 NOTE — Telephone Encounter (Signed)
Left message on machine to call back  

## 2017-10-08 NOTE — Telephone Encounter (Signed)
Can you ask if the discomfort is any better? If not, can offer him repeat Ct abdomen/pelvis with contrast. Thanks-JLL

## 2017-10-09 NOTE — Telephone Encounter (Signed)
Patient wife returning call. States if she does not get a call back in 4 hours she will get in her car and drive over here.

## 2017-10-09 NOTE — Telephone Encounter (Signed)
The pt has been instructed and will pick up contrast at the front desk.    You have been scheduled for a CT scan of the abdomen and pelvis at [place] ([address]---this is in the same building as Press photographer).   You are scheduled on 10/15/17 at 4 pm. You should arrive 15 minutes prior to your appointment time for registration. Please follow the written instructions below on the day of your exam:  WARNING: IF YOU ARE ALLERGIC TO IODINE/X-RAY DYE, PLEASE NOTIFY RADIOLOGY IMMEDIATELY AT [phone]! YOU WILL BE GIVEN A 13 HOUR PREMEDICATION PREP.  1) Do not eat or drink anything after 12 noon (4 hours prior to your test) 2) You have been given 2 bottles of oral contrast to drink. The solution may taste better if refrigerated, but do NOT add ice or any other liquid to this solution. Shake well before drinking.   Drink 1 bottle of contrast @ 2 pm (2 hours prior to your exam) Drink 1 bottle of contrast @ 3 pm (1 hour prior to your exam)  You may take any medications as prescribed with a small amount of water except for the following: Metformin, Glucophage, Glucovance, Avandamet, Riomet, Fortamet, Actoplus Met, Janumet, Glumetza or Metaglip. The above medications must be held the day of the exam AND 48 hours after the exam.  The purpose of you drinking the oral contrast is to aid in the visualization of your intestinal tract. The contrast solution may cause some diarrhea. Before your exam is started, you will be given a small amount of fluid to drink. Depending on your individual set of symptoms, you may also receive an intravenous injection of x-ray contrast/dye. Plan on being at [facility] for 30 minutes or longer, depending on the type of exam you are having performed.  This test typically takes 30-45 minutes to complete.  If you have any questions regarding your exam or if you need to reschedule, you may call the CT department at [phone] between the hours  of 8:00 am and 5:00 pm, Monday-Friday.  _____________________________________________

## 2017-10-11 ENCOUNTER — Other Ambulatory Visit: Payer: Medicare Other

## 2017-10-11 ENCOUNTER — Ambulatory Visit (INDEPENDENT_AMBULATORY_CARE_PROVIDER_SITE_OTHER)
Admission: RE | Admit: 2017-10-11 | Discharge: 2017-10-11 | Disposition: A | Discharge status: Home / Self Care | Payer: Medicare Other | Source: Ambulatory Visit | Attending: Physician Assistant | Admitting: Physician Assistant

## 2017-10-11 ENCOUNTER — Telehealth: Payer: Self-pay | Admitting: Physician Assistant

## 2017-10-11 ENCOUNTER — Ambulatory Visit (INDEPENDENT_AMBULATORY_CARE_PROVIDER_SITE_OTHER)
Admission: RE | Admit: 2017-10-11 | Discharge: 2017-10-11 | Disposition: A | Discharge status: Home / Self Care | Payer: Medicare Other | Source: Ambulatory Visit | Attending: Internal Medicine | Admitting: Internal Medicine

## 2017-10-11 DIAGNOSIS — E871 Hypo-osmolality and hyponatremia: Secondary | ICD-10-CM | POA: Diagnosis not present

## 2017-10-11 DIAGNOSIS — R109 Unspecified abdominal pain: Secondary | ICD-10-CM | POA: Diagnosis not present

## 2017-10-11 DIAGNOSIS — K529 Noninfective gastroenteritis and colitis, unspecified: Secondary | ICD-10-CM | POA: Diagnosis not present

## 2017-10-11 DIAGNOSIS — K5730 Diverticulosis of large intestine without perforation or abscess without bleeding: Secondary | ICD-10-CM | POA: Diagnosis not present

## 2017-10-11 DIAGNOSIS — J189 Pneumonia, unspecified organism: Secondary | ICD-10-CM | POA: Diagnosis not present

## 2017-10-11 DIAGNOSIS — R140 Abdominal distension (gaseous): Secondary | ICD-10-CM

## 2017-10-11 DIAGNOSIS — R142 Eructation: Secondary | ICD-10-CM

## 2017-10-11 MED ORDER — IOPAMIDOL (ISOVUE-300) INJECTION 61%
100.0000 mL | Freq: Once | INTRAVENOUS | Status: AC | PRN
  Administered 2017-10-11: 100 mL via INTRAVENOUS

## 2017-10-11 NOTE — Telephone Encounter (Signed)
The pt has been advised we are waiting on Dr Silverio Decamp to make her recommendations

## 2017-10-11 NOTE — Telephone Encounter (Signed)
Notes recorded by Levin Erp, PA on 10/11/2017 at 10:16 AM EST CT with no acute findings. Patient continues with symptoms. Would you recommend repeat EGD? Just had one in April. Any other recommendations? Thanks-JLL

## 2017-10-12 NOTE — Telephone Encounter (Signed)
The pt has been advised of the recommendations

## 2017-10-12 NOTE — Telephone Encounter (Signed)
Please check the result note. I responded to JL. Thanks

## 2017-10-15 ENCOUNTER — Inpatient Hospital Stay: Admission: RE | Admit: 2017-10-15 | Payer: Medicare Other | Source: Ambulatory Visit

## 2017-10-15 ENCOUNTER — Other Ambulatory Visit: Payer: Self-pay | Admitting: Internal Medicine

## 2017-10-15 DIAGNOSIS — R9389 Abnormal findings on diagnostic imaging of other specified body structures: Secondary | ICD-10-CM

## 2017-10-17 ENCOUNTER — Telehealth: Payer: Self-pay | Admitting: Internal Medicine

## 2017-10-17 NOTE — Telephone Encounter (Signed)
Copied from [place] [id number]. Topic: Quick Communication - Office Called Patient >> Oct 17, 2017  2:06 PM Darl Householder, RMA wrote: Reason for CRM: please return call to patient, pt states she was called by office but there is no communication as to why she was called

## 2017-10-18 DIAGNOSIS — H2513 Age-related nuclear cataract, bilateral: Secondary | ICD-10-CM | POA: Diagnosis not present

## 2017-10-18 DIAGNOSIS — H524 Presbyopia: Secondary | ICD-10-CM | POA: Diagnosis not present

## 2017-10-23 ENCOUNTER — Telehealth: Payer: Self-pay | Admitting: Gastroenterology

## 2017-10-23 NOTE — Telephone Encounter (Signed)
The pt has been added the wait list.

## 2017-10-25 DIAGNOSIS — R972 Elevated prostate specific antigen [PSA]: Secondary | ICD-10-CM | POA: Diagnosis not present

## 2017-11-06 HISTORY — PX: DENTAL SURGERY: SHX609

## 2017-11-09 ENCOUNTER — Ambulatory Visit (INDEPENDENT_AMBULATORY_CARE_PROVIDER_SITE_OTHER)
Admission: RE | Admit: 2017-11-09 | Discharge: 2017-11-09 | Disposition: A | Discharge status: Home / Self Care | Payer: Medicare Other | Source: Ambulatory Visit | Attending: Internal Medicine | Admitting: Internal Medicine

## 2017-11-09 DIAGNOSIS — R918 Other nonspecific abnormal finding of lung field: Secondary | ICD-10-CM | POA: Diagnosis not present

## 2017-11-09 DIAGNOSIS — R9389 Abnormal findings on diagnostic imaging of other specified body structures: Secondary | ICD-10-CM

## 2017-11-12 ENCOUNTER — Ambulatory Visit (INDEPENDENT_AMBULATORY_CARE_PROVIDER_SITE_OTHER): Payer: Medicare Other | Admitting: Gastroenterology

## 2017-11-12 ENCOUNTER — Encounter: Payer: Self-pay | Admitting: Gastroenterology

## 2017-11-12 VITALS — BP 132/80 | HR 57 | Ht 71.0 in | Wt 232.0 lb

## 2017-11-12 DIAGNOSIS — R1084 Generalized abdominal pain: Secondary | ICD-10-CM

## 2017-11-12 DIAGNOSIS — R110 Nausea: Secondary | ICD-10-CM

## 2017-11-12 DIAGNOSIS — R142 Eructation: Secondary | ICD-10-CM | POA: Diagnosis not present

## 2017-11-12 DIAGNOSIS — R1310 Dysphagia, unspecified: Secondary | ICD-10-CM

## 2017-11-12 DIAGNOSIS — K219 Gastro-esophageal reflux disease without esophagitis: Secondary | ICD-10-CM | POA: Diagnosis not present

## 2017-11-12 DIAGNOSIS — K449 Diaphragmatic hernia without obstruction or gangrene: Secondary | ICD-10-CM | POA: Diagnosis not present

## 2017-11-12 NOTE — Patient Instructions (Signed)
You have been scheduled for an Upper GI Series and Small Bowel Follow Thru at [facility] Your appointment is on 11/20/2017 at 10:30am. Please arrive 15 minutes prior to your test for registration. Make certain not to have anything to eat or drink after midnight on the night before your test. If you need to reschedule, please contact radiology at [phone]. --------------------------------------------------------------------------------------------------------------- An upper GI series uses x rays to help diagnose problems of the upper GI tract, which includes the esophagus, stomach, and duodenum. The duodenum is the first part of the small intestine. An upper GI series is conducted by a radiology technologist or a radiologist-a doctor who specializes in x-ray imaging-at a hospital or outpatient center. While sitting or standing in front of an x-ray machine, the patient drinks barium liquid, which is often white and has a chalky consistency and taste. The barium liquid coats the lining of the upper GI tract and makes signs of disease show up more clearly on x rays. X-ray video, called fluoroscopy, is used to view the barium liquid moving through the esophagus, stomach, and duodenum. Additional x rays and fluoroscopy are performed while the patient lies on an x-ray table. To fully coat the upper GI tract with barium liquid, the technologist or radiologist may press on the abdomen or ask the patient to change position. Patients hold still in various positions, allowing the technologist or radiologist to take x rays of the upper GI tract at different angles. If a technologist conducts the upper GI series, a radiologist will later examine the images to look for problems.  This test typically takes about 1 hour to complete --------------------------------------------------------------------------------------------------------------------------------------------- The Small Bowel Follow Thru  examination is used to visualize the entire small bowel (intestines); specifically the connection between the small and large intestine. You will be positioned on a flat x-ray table and an image of your abdomen taken. Then the technologist will show the x-ray to the radiologist. The radiologist will instruct your technologist how much (1-2 cups) barium sulfate you will drink and when to begin taking the timed x-rays, usually 15-30 minutes after you begin drinking. Barium is a harmless substance that will highlight your small intestine by absorbing x-ray. The taste is chalky and it feels very heavy both in the cup and in your stomach.  After the first x-ray is taken and shown to the radiologist, he/she will determine when the next image is to be taken. This is repeated until the barium has reached the end of the small intestine and enters the beginning of the colon (cecum). At such time when the barium spills into the colon, you will be positioned on the x-ray table once again. The radiologist will use a fluoroscopic camera to take some detailed pictures of the connection between your small intestine and colon. The fluoroscope is an x-ray unit that works with a television/computer screen. The radiologist will apply pressure to your abdomen with his/her hand and a lead glove, a plastic paddle, or a paddle with an inflated rubber balloon on the end. This is to spread apart your loops of intestine so he/she can see all areas.   This test typically takes around 1 hour to complete.   Drink plenty of water (8-10 cups/day) for a few days following the procedure to avoid constipation and blockage. The barium will make your stools white for a few days. --------------------------------------------------------------------------------------------------------------------------------------------   Gastroesophageal Reflux Disease, Adult Normally, food travels down the esophagus and stays in the stomach to be digested.  However, when a person has gastroesophageal reflux disease (GERD), food and stomach acid move back up into the esophagus. When this happens, the esophagus becomes sore and inflamed. Over time, GERD can create small holes (ulcers) in the lining of the esophagus. What are the causes? This condition is caused by a problem with the muscle between the esophagus and the stomach (lower esophageal sphincter, or LES). Normally, the LES muscle closes after food passes through the esophagus to the stomach. When the LES is weakened or abnormal, it does not close properly, and that allows food and stomach acid to go back up into the esophagus. The LES can be weakened by certain dietary substances, medicines, and medical conditions, including:  Tobacco use.  Pregnancy.  Having a hiatal hernia.  Heavy alcohol use.  Certain foods and beverages, such as coffee, chocolate, onions, and peppermint.  What increases the risk? This condition is more likely to develop in:  People who have an increased body weight.  People who have connective tissue disorders.  People who use NSAID medicines.  What are the signs or symptoms? Symptoms of this condition include:  Heartburn.  Difficult or painful swallowing.  The feeling of having a lump in the throat.  Abitter taste in the mouth.  Bad breath.  Having a large amount of saliva.  Having an upset or bloated stomach.  Belching.  Chest pain.  Shortness of breath or wheezing.  Ongoing (chronic) cough or a night-time cough.  Wearing away of tooth enamel.  Weight loss.  Different conditions can cause chest pain. Make sure to see your health care provider if you experience chest pain. How is this diagnosed? Your health care provider will take a medical history and perform a physical exam. To determine if you have mild or severe GERD, your health care provider may also monitor how you respond to treatment. You may also have other tests,  including:  An endoscopy toexamine your stomach and esophagus with a small camera.  A test thatmeasures the acidity level in your esophagus.  A test thatmeasures how much pressure is on your esophagus.  A barium swallow or modified barium swallow to show the shape, size, and functioning of your esophagus.  How is this treated? The goal of treatment is to help relieve your symptoms and to prevent complications. Treatment for this condition may vary depending on how severe your symptoms are. Your health care provider may recommend:  Changes to your diet.  Medicine.  Surgery.  Follow these instructions at home: Diet  Follow a diet as recommended by your health care provider. This may involve avoiding foods and drinks such as: ? Coffee and tea (with or without caffeine). ? Drinks that containalcohol. ? Energy drinks and sports drinks. ? Carbonated drinks or sodas. ? Chocolate and cocoa. ? Peppermint and mint flavorings. ? Garlic and onions. ? Horseradish. ? Spicy and acidic foods, including peppers, chili powder, curry powder, vinegar, hot sauces, and barbecue sauce. ? Citrus fruit juices and citrus fruits, such as oranges, lemons, and limes. ? Tomato-based foods, such as red sauce, chili, salsa, and pizza with red sauce. ? Fried and fatty foods, such as donuts, french fries, potato chips, and high-fat dressings. ? High-fat meats, such as hot dogs and fatty cuts of red and white meats, such as rib eye steak, sausage, ham, and bacon. ? High-fat dairy items, such as whole milk, butter, and cream cheese.  Eat small, frequent meals instead of large meals.  Avoid drinking large amounts  of liquid with your meals.  Avoid eating meals during the 2-3 hours before bedtime.  Avoid lying down right after you eat.  Do not exercise right after you eat. General instructions  Pay attention to any changes in your symptoms.  Take over-the-counter and prescription medicines only as  told by your health care provider. Do not take aspirin, ibuprofen, or other NSAIDs unless your health care provider told you to do so.  Do not use any tobacco products, including cigarettes, chewing tobacco, and e-cigarettes. If you need help quitting, ask your health care provider.  Wear loose-fitting clothing. Do not wear anything tight around your waist that causes pressure on your abdomen.  Raise (elevate) the head of your bed 6 inches (15cm).  Try to reduce your stress, such as with yoga or meditation. If you need help reducing stress, ask your health care provider.  If you are overweight, reduce your weight to an amount that is healthy for you. Ask your health care provider for guidance about a safe weight loss goal.  Keep all follow-up visits as told by your health care provider. This is important. Contact a health care provider if:  You have new symptoms.  You have unexplained weight loss.  You have difficulty swallowing, or it hurts to swallow.  You have wheezing or a persistent cough.  Your symptoms do not improve with treatment.  You have a hoarse voice. Get help right away if:  You have pain in your arms, neck, jaw, teeth, or back.  You feel sweaty, dizzy, or light-headed.  You have chest pain or shortness of breath.  You vomit and your vomit looks like blood or coffee grounds.  You faint.  Your stool is bloody or black.  You cannot swallow, drink, or eat. This information is not intended to replace advice given to you by your health care provider. Make sure you discuss any questions you have with your health care provider. Document Released: 08/02/2005 Document Revised: 03/22/2016 Document Reviewed: 02/17/2015 Elsevier Interactive Patient Education  [employer].

## 2017-11-12 NOTE — Progress Notes (Signed)
[name]    [id number]    [birth year]  Primary Care Physician:[name], [name], MD  Referring Physician: [name], MD [place],  [id number]  Chief complaint: Excessive belching, abdominal pain  HPI: 67 year old male with history of chronic GERD status post Nissen fundoplication, recurrent hiatal hernia, ventral hernia here with complaints of excessive belching, regurgitation and nausea.  When he tries to drink water he feels it gets hung up in his throat and also has difficulty swallowing pills, feel that they do not go down.  He has minimal trouble with meals but does have to eat slow.  Constantly belches after a meal or even after drinking a sip of water.  According to him this started after he had an episode of acute gastroenteritis with  intractable vomiting in November 2017.  He was admitted to [facility] and was treated with IV fluids at the time. CT abdomen pelvis November 2018 showed mild fluid-filled small bowel loops without mechanical obstruction, anterior abdominal wall laxity with widemouth ventral hernia and colonic diverticulosis. Colonoscopy April 2018 with removal of 2 tubular adenomas from ascending and transverse colon and rectal hyperplastic polyps, pan colonic diverticulosis and small internal hemorrhoids.  Recall colonoscopy for surveillance in 5 years. EGD April 2018 showed erosive esophagitis and paraesophageal hernia    Outpatient Encounter Medications as of 11/12/2017  Medication Sig  . acetaminophen (TYLENOL) 325 MG tablet Take 650 mg by mouth every 6 (six) hours as needed (pain).  Marland Kitchen ALPRAZolam (XANAX) 1 MG tablet Take 1 mg by mouth at bedtime as needed for anxiety.   Marland Kitchen aspirin EC 81 MG tablet Take 1 tablet (81 mg total) by mouth daily.  . carbamazepine (TEGRETOL) 200 MG tablet Take 200 mg 2 (two) times daily by mouth.  . fluticasone (FLONASE) 50 MCG/ACT nasal spray Place 1 spray daily as needed into both  nostrils for allergies or rhinitis.  Marland Kitchen gabapentin (NEURONTIN) 400 MG capsule Take 800 mg 2 (two) times daily by mouth.   Marland Kitchen guaiFENesin-dextromethorphan (ROBITUSSIN DM) 100-10 MG/5ML syrup Take 5 mLs every 4 (four) hours as needed by mouth for cough.  . hydrocortisone 2.5 % cream Apply 1 application 2 (two) times daily as needed topically (TO NOSE).   Marland Kitchen loratadine (CLARITIN) 10 MG tablet Take 1 tablet (10 mg total) daily by mouth. For allergies  . losartan-hydrochlorothiazide (HYZAAR) 50-12.5 MG tablet Take 1 tablet by mouth daily.  Marland Kitchen NEXIUM 40 MG capsule Take 1 capsule (40 mg total) by mouth daily.  . ondansetron (ZOFRAN) 4 MG tablet Take 1 tablet (4 mg total) by mouth every 6 (six) hours as needed.  . polyethylene glycol powder (GLYCOLAX/MIRALAX) powder DISSOLVE 1 CAPFUL IN LIQUID EVERY DAY AS NEEDED FOR CONSTIPATION (Patient taking differently: DISSOLVE 1 CAPFUL IN LIQUID EVERY DAY)  . sucralfate (CARAFATE) 1 GM/10ML suspension Take 10 mLs (1 g total) by mouth 4 (four) times daily.  . [DISCONTINUED] cyanocobalamin (,VITAMIN B-12,) 1000 MCG/ML injection Inject 1 mL (1,000 mcg total) into the skin every 30 (thirty) days.  . [DISCONTINUED] pantoprazole (PROTONIX) 40 MG tablet Take 1 tablet (40 mg total) by mouth 2 (two) times daily before a meal.   No facility-administered encounter medications on file as of 11/12/2017.     Allergies as of 11/12/2017 - Review Complete 11/12/2017  Allergen Reaction Noted  . Dilantin [phenytoin]  07/27/2017  . Ace inhibitors Cough 04/02/2008  . Augmentin [  amoxicillin-pot clavulanate] Nausea And Vomiting 07/06/2017  . Codeine Nausea And Vomiting 11/10/2014  . Dilaudid [hydromorphone hcl]  09/12/2017  . Sulfa antibiotics  12/17/2014    Past Medical History:  Diagnosis Date  . Allergic rhinitis   . Anemia, pernicious   . Anxiety and depression    sees Dr. [name]  . Arthritis   . Catheter-associated urinary tract infection ([place])   . Diverticulosis of colon     . Elevated PSA    and hypogonadism, sees urologist  . Facial asymmetry   . GERD (gastroesophageal reflux disease)    hiatal hernia  . HTN (hypertension)   . Hx of blood transfusion reaction   . Hyperlipidemia   . Hyperparathyroidism ([place])   . Hyperplastic colon polyp   . Hypertension    acei causes cough  . IBS (irritable bowel syndrome)   . Nephrolithiasis   . Obstructive chronic bronchitis without exacerbation, followed by Dr. [name] 03/04/2009   ONO RA  Normal 07/28/11 6 min walk >566m no desat PFTs 08/08/11: DLCO 70% TLC 80%  FeV1 80%   Fef 25 75 56% with significant improvement after BD   . Peritonitis ([place])    after surgery in 1999  . Pneumonia   . PONV (postoperative nausea and vomiting)   . Rectal fissure   . Trigeminal neuralgia    has facial asymetry  . Ventral hernia     Past Surgical History:  Procedure Laterality Date  . ABDOMINAL SURGERY     Multiple  . BRAIN SURGERY  March 2012   Trigeminal nerve  . CHOLECYSTECTOMY  1981  . HAND SURGERY  1973   Left  . Novi   Left  . LITHOTRIPSY     Right  . NISSEN FUNDOPLICATION  5956    complicated by gastric perforation, peritonitis, ventral nernia   . RHIZOTOMY Right 11/17/2014   Procedure: RHIZOTOMY TO RIGHT V2,V3;  Surgeon: Erline Levine, MD;  Location: [place] [facility]UR[facility] ORS;  Service: Neurosurgery;  Laterality: Right;  right  . RHIZOTOMY Right 12/22/2014   Procedure: Right V2 and V3 trigeminal rhysolysis;  Surgeon: Erline Levine, MD;  Location: [place] [facility]UR[facility] ORS;  Service: Neurosurgery;  Laterality: Right;  Right V2 and V3 trigeminal rhysolysis  . UPPER GASTROINTESTINAL ENDOSCOPY    . VENTRAL HERNIA REPAIR      Family History  Problem Relation Age of Onset  . Diabetes Mother   . Coronary artery disease Mother        CABG  . Diabetes Sister   . Diabetes Brother   . Hypertension Brother   . Clotting disorder Brother   . Colon cancer Neg Hx   . Esophageal cancer Neg Hx   . Stomach cancer  Neg Hx     Social History   Socioeconomic History  . Marital status: Married    Spouse name: Not on file  . Number of children: 2  . Years of education: Not on file  . Highest education level: Not on file  Social Needs  . Financial resource strain: Not on file  . Food insecurity - worry: Not on file  . Food insecurity - inability: Not on file  . Transportation needs - medical: Not on file  . Transportation needs - non-medical: Not on file  Occupational History  . Occupation: Disabled - psych  Tobacco Use  . Smoking status: Former Smoker    Packs/day: 1.00    Years: 20.00  Pack years: 20.00    Types: Cigarettes    Last attempt to quit: 11/06/1996    Years since quitting: 21.0  . Smokeless tobacco: Former Systems developer    Types: [place] date: 11/07/1983  Substance and Sexual Activity  . Alcohol use: No  . Drug use: No  . Sexual activity: Not on file  Other Topics Concern  . Not on file  Social History Narrative   Daily Caffeine Use:  Yes            Review of systems: Review of Systems  Constitutional: Negative for fever and chills.  HENT: Negative.   Eyes: Negative for blurred vision.  Respiratory: Negative for cough, shortness of breath and wheezing.   Cardiovascular: Negative for chest pain and palpitations.  Gastrointestinal: as per HPI Genitourinary: Negative for dysuria, urgency, frequency and hematuria.  Musculoskeletal: Negative for myalgias, back pain and joint pain.  Skin: Negative for itching and rash.  Neurological: Negative for dizziness, tremors, focal weakness, seizures and loss of consciousness.  Endo/Heme/Allergies: Positive for seasonal allergies.  Psychiatric/Behavioral: Negative for depression, suicidal ideas and hallucinations.  All other systems reviewed and are negative.   Physical Exam: Vitals:   11/12/17 0941  BP: 132/80  Pulse: (!) 57   Body mass index is 32.36 kg/m. Gen:      No acute distress HEENT:  EOMI, sclera  anicteric Neck:     No masses; no thyromegaly Lungs:    Clear to auscultation bilaterally; normal respiratory effort CV:         Regular rate and rhythm; no murmurs Abd:      + bowel sounds; soft, non-tender; no palpable masses, no distension Ext:    No edema; adequate peripheral perfusion Skin:      Warm and dry; no rash Neuro: alert and oriented x 3 Psych: normal mood and affect  Data Reviewed:  Reviewed labs, radiology imaging, old records and pertinent past GI work up CT abdomen and pelvis 09/12/2017 Stomach/Bowel: Status post Nissen fundoplication. The stomach is nondistended. Moderate segmental fluid-filled distention of small bowel in the left upper quadrant is again re- demonstrated suggestive of mild enteritis. No mechanical source of bowel obstruction is seen. Postsurgical changes are noted along the anterior abdominal wall with herniation of the the anti mesenteric border of a tiny segment of nonobstructed small bowel in the right hemiabdomen, unchanged from prior. No associated inflammation or obstruction. Normal appendix. Moderate colonic fecal retention without colonic wall thickening. There is diverticulosis of the descending sigmoid colon without diverticulitis.  Assessment and Plan/Recommendations:  67 year old male with history of chronic GERD status post Nissen fundoplication, recurrent hernia and large abdominal wall ventral hernia here with complaints of excessive belching, dysphagia and abdominal pain Scheduled for upper GI series with small bowel follow-through to further evaluate Discussed antireflux measures in detail with lifestyle modification and dietary changes Small frequent meals Avoid aerophagia Continue Nexium 40 mg daily, 30 minutes before breakfast Abdominal binder and avoid lifting heavy weights over 30 pounds to prevent worsening of ventral hernia and diastasis of rectus abdominis Return after upper GI series  25 minutes was spent face-to-face  with the patient. Greater than 50% of the time used for counseling as well as treatment plan and follow-up. He had multiple questions which were answered to his satisfaction  K. Denzil Magnuson , MD [phone] Mon-Fri 8a-5p [phone] after 5p, weekends, holidays  CC: [name], MD

## 2017-11-14 DIAGNOSIS — G500 Trigeminal neuralgia: Secondary | ICD-10-CM | POA: Diagnosis not present

## 2017-11-15 DIAGNOSIS — K449 Diaphragmatic hernia without obstruction or gangrene: Secondary | ICD-10-CM | POA: Diagnosis not present

## 2017-11-15 DIAGNOSIS — K439 Ventral hernia without obstruction or gangrene: Secondary | ICD-10-CM | POA: Diagnosis not present

## 2017-11-15 DIAGNOSIS — K279 Peptic ulcer, site unspecified, unspecified as acute or chronic, without hemorrhage or perforation: Secondary | ICD-10-CM | POA: Diagnosis not present

## 2017-11-20 ENCOUNTER — Ambulatory Visit (HOSPITAL_COMMUNITY)
Admission: RE | Admit: 2017-11-20 | Discharge: 2017-11-20 | Disposition: A | Discharge status: Home / Self Care | Payer: Medicare Other | Source: Ambulatory Visit | Attending: Gastroenterology | Admitting: Gastroenterology

## 2017-11-20 DIAGNOSIS — K219 Gastro-esophageal reflux disease without esophagitis: Secondary | ICD-10-CM | POA: Diagnosis not present

## 2017-11-20 DIAGNOSIS — R110 Nausea: Secondary | ICD-10-CM | POA: Insufficient documentation

## 2017-11-20 DIAGNOSIS — K449 Diaphragmatic hernia without obstruction or gangrene: Secondary | ICD-10-CM | POA: Diagnosis not present

## 2017-11-20 DIAGNOSIS — R142 Eructation: Secondary | ICD-10-CM | POA: Insufficient documentation

## 2017-11-20 DIAGNOSIS — R1310 Dysphagia, unspecified: Secondary | ICD-10-CM

## 2017-11-21 ENCOUNTER — Other Ambulatory Visit: Payer: Self-pay

## 2017-11-21 ENCOUNTER — Telehealth: Payer: Self-pay | Admitting: Gastroenterology

## 2017-11-21 MED ORDER — RANITIDINE HCL 300 MG PO TABS: 300.0000 mg | ORAL_TABLET | Freq: Every day | ORAL | 2 refills | Status: DC

## 2017-11-21 MED ORDER — DEXLANSOPRAZOLE 60 MG PO CPDR: DELAYED_RELEASE_CAPSULE | ORAL | 2 refills | Status: DC

## 2017-11-21 NOTE — Telephone Encounter (Signed)
Rx to [facility]

## 2017-11-26 ENCOUNTER — Telehealth: Payer: Self-pay | Admitting: Gastroenterology

## 2017-11-26 NOTE — Telephone Encounter (Signed)
See the esophagram report done recently. Patient is taking Dexilant and Ranitidine as he was instructed. These were started 11/21/17. He cannot tell any changes or improvement in his symptoms. He says he gets full very quickly and therefore eats very little. He is a water drinker, but to do so is uncomfortable. He "burps" for hours or has hiccups after anything liquid. Reports a "pain" on the right side of "where my esophagus is."  Denies any sensation of food hanging or getting stuck. Liquids cause the sensation. Very concerned about his weight loss.

## 2017-11-27 NOTE — Telephone Encounter (Signed)
I explained this to the patient. He says I am not understanding his problem and he needs to have the EGD repeated. "Something changed" after his stomach virus in November. He tells me today that he has seen Dr Hassell Done since he was last seen here. Reports that Dr Hassell Done told him he needed an EGD. Reviewed the symptoms the patient is complaining of. He tells me he is losing weight because he cannot eat. Asked "why can you not eat?" He says he is full and has the hiccups. He burps and get hiccups when he drinks water. States liquids won't go down, but instead give him hiccups. I was unsuccessful in getting him to agree to either suggestion of GES or referral to tertiary center. He is upset that an EGD is not being ordered.

## 2017-11-27 NOTE — Telephone Encounter (Signed)
Patient is notified and very pleased per his own statement. Appointments are scheduled. For signing the consent form and having the procedure 12/07/17 and 12/10/17.

## 2017-11-27 NOTE — Telephone Encounter (Signed)
Ok to proceed with EGD but will be low yield procedure

## 2017-11-27 NOTE — Telephone Encounter (Signed)
Esophagogram and upper GI series didn't show any major pathology other than small hiatal hernia and significant gastro esophageal reflux.  Please schedule gastric emptying scan to check for possible gastroparesis given his ongoing symptoms. Will consider referral to Dr Derrill Kay as he is not improving, if he is willing to go to [facility].

## 2017-11-28 DIAGNOSIS — R066 Hiccough: Secondary | ICD-10-CM | POA: Diagnosis not present

## 2017-11-30 ENCOUNTER — Ambulatory Visit (AMBULATORY_SURGERY_CENTER): Payer: Self-pay

## 2017-11-30 VITALS — Ht 71.0 in | Wt 223.8 lb

## 2017-11-30 DIAGNOSIS — R1319 Other dysphagia: Secondary | ICD-10-CM

## 2017-11-30 NOTE — Progress Notes (Signed)
Per pt, no allergies to soy or egg products.Pt not taking any weight loss meds or using  O2 at home.  Pt refused emmi video. 

## 2017-12-07 ENCOUNTER — Telehealth: Payer: Self-pay

## 2017-12-07 ENCOUNTER — Ambulatory Visit (AMBULATORY_SURGERY_CENTER): Payer: Medicare Other | Admitting: Gastroenterology

## 2017-12-07 ENCOUNTER — Encounter: Payer: Self-pay | Admitting: Gastroenterology

## 2017-12-07 ENCOUNTER — Other Ambulatory Visit: Payer: Self-pay

## 2017-12-07 VITALS — BP 162/98 | HR 66 | Temp 97.1°F | Resp 12 | Ht 71.0 in | Wt 223.0 lb

## 2017-12-07 DIAGNOSIS — K2970 Gastritis, unspecified, without bleeding: Secondary | ICD-10-CM | POA: Diagnosis not present

## 2017-12-07 DIAGNOSIS — R1319 Other dysphagia: Secondary | ICD-10-CM

## 2017-12-07 DIAGNOSIS — K2990 Gastroduodenitis, unspecified, without bleeding: Secondary | ICD-10-CM

## 2017-12-07 DIAGNOSIS — R1310 Dysphagia, unspecified: Secondary | ICD-10-CM | POA: Diagnosis not present

## 2017-12-07 DIAGNOSIS — K2950 Unspecified chronic gastritis without bleeding: Secondary | ICD-10-CM | POA: Diagnosis not present

## 2017-12-07 DIAGNOSIS — K219 Gastro-esophageal reflux disease without esophagitis: Secondary | ICD-10-CM | POA: Diagnosis not present

## 2017-12-07 DIAGNOSIS — R066 Hiccough: Secondary | ICD-10-CM

## 2017-12-07 DIAGNOSIS — I10 Essential (primary) hypertension: Secondary | ICD-10-CM | POA: Diagnosis not present

## 2017-12-07 MED ORDER — SODIUM CHLORIDE 0.9 % IV SOLN
500.0000 mL | Freq: Once | INTRAVENOUS | Status: DC
Start: 2017-12-07 — End: 2018-11-13

## 2017-12-07 NOTE — Op Note (Addendum)
[place] Patient Name: [name] Procedure Date: 12/07/2017 2:17 PM MRN: [id number] Endoscopist: Mauri Pole , MD Age: 67 Referring MD:  Date of Birth: Feb 17, 1951 Gender: Male Account #: [id number] Procedure:                Upper GI endoscopy Indications:              Dysphagia, Anorexia, Weight loss, Hiccups Medicines:                Monitored Anesthesia Care Procedure:                Pre-Anesthesia Assessment:                           - Prior to the procedure, a History and Physical                            was performed, and patient medications and                            allergies were reviewed. The patient's tolerance of                            previous anesthesia was also reviewed. The risks                            and benefits of the procedure and the sedation                            options and risks were discussed with the patient.                            All questions were answered, and informed consent                            was obtained. Prior Anticoagulants: The patient has                            taken no previous anticoagulant or antiplatelet                            agents. ASA Grade Assessment: II - A patient with                            mild systemic disease. After reviewing the risks                            and benefits, the patient was deemed in                            satisfactory condition to undergo the procedure.                           After obtaining informed consent, the endoscope was  passed under direct vision. Throughout the                            procedure, the patient's blood pressure, pulse, and                            oxygen saturations were monitored continuously. The                            Model GIF-HQ190 [id number]) scope was introduced                            through the mouth, and advanced to the second part                            of  duodenum. The upper GI endoscopy was                            accomplished without difficulty. The patient                            tolerated the procedure well. Scope In: Scope Out: Findings:                 Esophagogastric landmarks were identified: the                            Z-line was found at 39 cm, the gastroesophageal                            junction was found at 40 cm and the site of hiatal                            narrowing was found at 45 cm from the incisors.                           No endoscopic abnormality was evident in the                            esophagus to explain the patient's complaint of                            dysphagia.                           Evidence of a fundoplication was found in the                            cardia. The wrap appeared loose with evidence of                            hiatal hernia. This was traversed.                           A 5 cm  hiatal hernia was present.                           Patchy moderately erythematous mucosa without                            bleeding was found in the entire examined stomach.                            Biopsies were taken with a cold forceps for                            histology. Biopsies were taken with a cold forceps                            for Helicobacter pylori testing.                           The examined duodenum was normal. Complications:            No immediate complications. Estimated Blood Loss:     Estimated blood loss was minimal. Impression:               - Esophagogastric landmarks identified.                           - 5 cm hiatal hernia.                           - Erythematous mucosa in the stomach. Biopsied.                           - Normal examined duodenum. Recommendation:           - Patient has a contact number available for                            emergencies. The signs and symptoms of potential                            delayed complications were  discussed with the                            patient. Return to normal activities tomorrow.                            Written discharge instructions were provided to the                            patient.                           - Resume previous diet.                           - Continue present medications.                           -  Await pathology results.                           - Will discuss with Dr [name] regarding increasing                            dose of Gabapentin gradually back up ( Recently                            Gabapentin was decreased from 3200mg  per day to                            1200mg  day prior to onset of his symptoms). Drastic                            decrease in gabapentin dose could have worsened or                            triggered Hiccups Mauri Pole, MD 12/07/2017 2:37:20 PM This report has been signed electronically.

## 2017-12-07 NOTE — Telephone Encounter (Addendum)
Contacted Dr Clydell Hakim office. [phone] Fax [phone] Stated request for message to Dr [name] regarding a gradual increase of the Gabapentin. Records also faxed.

## 2017-12-07 NOTE — Progress Notes (Signed)
Called to room to assist during endoscopic procedure.  Patient ID and intended procedure confirmed with present staff. Received instructions for my participation in the procedure from the performing physician.  

## 2017-12-07 NOTE — Patient Instructions (Signed)
YOU HAD AN ENDOSCOPIC PROCEDURE TODAY AT [facility]:   Refer to the procedure report that was given to you for any specific questions about what was found during the examination.  If the procedure report does not answer your questions, please call your gastroenterologist to clarify.  If you requested that your care partner not be given the details of your procedure findings, then the procedure report has been included in a sealed envelope for you to review at your convenience later.  YOU SHOULD EXPECT: Some feelings of bloating in the abdomen. Passage of more gas than usual.  Walking can help get rid of the air that was put into your GI tract during the procedure and reduce the bloating. If you had a lower endoscopy (such as a colonoscopy or flexible sigmoidoscopy) you may notice spotting of blood in your stool or on the toilet paper. If you underwent a bowel prep for your procedure, you may not have a normal bowel movement for a few days.  Please Note:  You might notice some irritation and congestion in your nose or some drainage.  This is from the oxygen used during your procedure.  There is no need for concern and it should clear up in a day or so.  SYMPTOMS TO REPORT IMMEDIATELY:   Following upper endoscopy (EGD)  Vomiting of blood or coffee ground material  New chest pain or pain under the shoulder blades  Painful or persistently difficult swallowing  New shortness of breath  Fever of 100F or higher  Black, tarry-looking stools  For urgent or emergent issues, a gastroenterologist can be reached at any hour by calling [phone].   DIET:  We do recommend a small meal at first, but then you may proceed to your regular diet.  Drink plenty of fluids but you should avoid alcoholic beverages for 24 hours.  ACTIVITY:  You should plan to take it easy for the rest of today and you should NOT DRIVE or use heavy machinery until tomorrow (because of the sedation medicines used  during the test).    FOLLOW UP: Our staff will call the number listed on your records the next business day following your procedure to check on you and address any questions or concerns that you may have regarding the information given to you following your procedure. If we do not reach you, we will leave a message.  However, if you are feeling well and you are not experiencing any problems, there is no need to return our call.  We will assume that you have returned to your regular daily activities without incident.  If any biopsies were taken you will be contacted by phone or by letter within the next 1-3 weeks.  Please call us at [phone] if you have not heard about the biopsies in 3 weeks.   Await for biopsy results Hiatal Hernia (handout given) Notified Beth of Dr. Luan Pulling information, and made a copy of Dr. Luan Pulling business card to be scan on the chart.  SIGNATURES/CONFIDENTIALITY: You and/or your care partner have signed paperwork which will be entered into your electronic medical record.  These signatures attest to the fact that that the information above on your After Visit Summary has been reviewed and is understood.  Full responsibility of the confidentiality of this discharge information lies with you and/or your care-partner.

## 2017-12-07 NOTE — Progress Notes (Signed)
Per patient wife asked if patient can have a little more of IV fluids due to the patient has not been drinking. Patient is getting a additional fluids at this time.

## 2017-12-07 NOTE — Progress Notes (Signed)
Pt's states no medical or surgical changes since previsit or office visit. 

## 2017-12-07 NOTE — Progress Notes (Signed)
Report to PACU, RN, vss, BBS= Clear.  

## 2017-12-10 ENCOUNTER — Telehealth: Payer: Self-pay | Admitting: *Deleted

## 2017-12-10 ENCOUNTER — Encounter: Payer: Medicare Other | Admitting: Gastroenterology

## 2017-12-10 NOTE — Telephone Encounter (Signed)
Left message for emily at Dr [name] office to discuss the Gabapentin. [phone] ext [phone]

## 2017-12-10 NOTE — Telephone Encounter (Signed)
  Follow up Call-  Call back number 12/07/2017 03/05/2017 11/22/2015  Post procedure Call Back phone  # [phone]  Permission to leave phone message Yes Yes Yes  Some recent data might be hidden     Patient questions:  Do you have a fever, pain , or abdominal swelling? No. Pain Score  0 *  Have you tolerated food without any problems? Yes.    Have you been able to return to your normal activities? Yes.    Do you have any questions about your discharge instructions: Diet   No. Medications  No. Follow up visit  No.  Do you have questions or concerns about your Care? No.  Actions: * If pain score is 4 or above: No action needed, pain <4.

## 2017-12-11 ENCOUNTER — Ambulatory Visit: Payer: Medicare Other | Admitting: Gastroenterology

## 2017-12-12 ENCOUNTER — Telehealth: Payer: Self-pay | Admitting: Gastroenterology

## 2017-12-13 ENCOUNTER — Encounter: Payer: Self-pay | Admitting: Gastroenterology

## 2017-12-13 NOTE — Telephone Encounter (Signed)
It may take few days after dose increase to notice any improvement.  He was planning to follow up with Dr Zenaida Niece, ask to be referred to a surgeon in [place] to consider revision of fundoplication and repair of hiatal hernia .

## 2017-12-13 NOTE — Telephone Encounter (Signed)
Spoke with [name]. She can see the biopsy results through the patient portal of the My Chart. [name] has been in touch with Dr [name] and Gabapentin has been increased. Reports there is no change in his "burping" at this point. Reviewed diet. Breakfast is a boiled egg and toast. Lunch is half a sandwich using deli meat. Supper is broiled chicken, green beans. No soda or fruit juices.

## 2017-12-13 NOTE — Telephone Encounter (Signed)
Patient's appointment with Dr Hassell Done is tomorrow.

## 2017-12-14 DIAGNOSIS — R066 Hiccough: Secondary | ICD-10-CM | POA: Diagnosis not present

## 2017-12-21 DIAGNOSIS — H43812 Vitreous degeneration, left eye: Secondary | ICD-10-CM | POA: Diagnosis not present

## 2017-12-24 DIAGNOSIS — H43813 Vitreous degeneration, bilateral: Secondary | ICD-10-CM | POA: Diagnosis not present

## 2017-12-24 DIAGNOSIS — H3562 Retinal hemorrhage, left eye: Secondary | ICD-10-CM | POA: Diagnosis not present

## 2017-12-24 DIAGNOSIS — H35372 Puckering of macula, left eye: Secondary | ICD-10-CM | POA: Diagnosis not present

## 2017-12-24 DIAGNOSIS — H33103 Unspecified retinoschisis, bilateral: Secondary | ICD-10-CM | POA: Diagnosis not present

## 2017-12-26 ENCOUNTER — Other Ambulatory Visit: Payer: Self-pay | Admitting: Physician Assistant

## 2018-01-16 ENCOUNTER — Encounter: Payer: Self-pay | Admitting: Gastroenterology

## 2018-01-16 ENCOUNTER — Ambulatory Visit (INDEPENDENT_AMBULATORY_CARE_PROVIDER_SITE_OTHER): Payer: Medicare Other | Admitting: Gastroenterology

## 2018-01-16 VITALS — BP 118/82 | HR 68 | Ht 71.0 in | Wt 219.4 lb

## 2018-01-16 DIAGNOSIS — Z9889 Other specified postprocedural states: Secondary | ICD-10-CM

## 2018-01-16 DIAGNOSIS — R066 Hiccough: Secondary | ICD-10-CM

## 2018-01-16 DIAGNOSIS — K219 Gastro-esophageal reflux disease without esophagitis: Secondary | ICD-10-CM | POA: Diagnosis not present

## 2018-01-16 DIAGNOSIS — R142 Eructation: Secondary | ICD-10-CM | POA: Diagnosis not present

## 2018-01-16 DIAGNOSIS — R140 Abdominal distension (gaseous): Secondary | ICD-10-CM | POA: Diagnosis not present

## 2018-01-16 DIAGNOSIS — K449 Diaphragmatic hernia without obstruction or gangrene: Secondary | ICD-10-CM | POA: Diagnosis not present

## 2018-01-16 DIAGNOSIS — K5900 Constipation, unspecified: Secondary | ICD-10-CM | POA: Diagnosis not present

## 2018-01-16 MED ORDER — PANTOPRAZOLE SODIUM 40 MG PO TBEC: 40.0000 mg | DELAYED_RELEASE_TABLET | Freq: Every day | ORAL | 3 refills | Status: DC

## 2018-01-16 MED ORDER — OMEPRAZOLE 40 MG PO CPDR: 40.0000 mg | DELAYED_RELEASE_CAPSULE | Freq: Every day | ORAL | 3 refills | Status: DC

## 2018-01-16 MED ORDER — RANITIDINE HCL 150 MG PO TABS: 150.0000 mg | ORAL_TABLET | Freq: Every day | ORAL | 3 refills | Status: DC

## 2018-01-16 MED ORDER — FAMOTIDINE 20 MG PO TABS: 20.0000 mg | ORAL_TABLET | Freq: Every day | ORAL | 11 refills | Status: DC

## 2018-01-16 NOTE — Patient Instructions (Addendum)
We have sent the following medications to your pharmacy for you to pick up at your Protonix 40mg  daily,Zantac150mg  at bedtime  Eat small frequent meals       Normal BMI (Body Mass Index- based on height and weight) is between 23 and 30. Your BMI today is Body mass index is 30.6 kg/m. Marland Kitchen Please consider follow up  regarding your BMI with your Primary Care Provider.  Follow up in 6 months

## 2018-01-16 NOTE — Progress Notes (Signed)
[name]    [id number]    November 24, 1950  Primary Morgan Physician:[name], Jeffrey Oris, MD  Referring Physician: [name], MD [name], [name] [address]  Chief complaint: GERD, hiccups, bloating  HPI: 67 year old male with history of chronic GERD status post Nissen fundoplication complicated by postop leak, recurrent hiatal hernia, trigeminal neuralgia status post ablation on high-dose gabapentin and Tegretol is here for follow-up visit. He developed worsening hiccups and excessive belching after he had an episode of acute gastroenteritis last fall in November 2018, around the same time gabapentin dose was decreased because he was doing well post ablation and patient wanted to come off his medications.  He did not have improvement with high-dose PPI.   Upper GI series November 20, 2017 showed small sliding hiatal hernia with mild smooth narrowing just about the hiatal hernia , also had prominent gastroesophageal reflux to the level of upper thoracic esophagus otherwise was unremarkable exam. EGD December 07, 2017 showed hiatal hernia otherwise no acute abnormality. I discussed with Jeffrey. Maryjean Morgan, neurosurgeon and pain management specialist, but going back up on gabapentin dose as his symptoms seems to be precipitated by sudden tapering down of gabapentin.  He is currently is back to his prior dose and is taking gabapentin 2800 mg daily.  He also started taking probiotics align daily.  Reports improvement of bloating and hiccups.  He is taking Dexilant and ranitidine as needed. Denies any nausea, vomiting, abdominal pain, melena or bright red blood per rectum  CT abdomen pelvis November 2018 showed multiple fluid-filled small bowel loops and anterior abdominal wall ventral hernia otherwise unremarkable with no acute abnormality Colonoscopy in April 2018 with removal of 2 tubular adenomas with recall colonoscopy set for surveillance in 5 years EGD April 2018  showed erosive esophagitis and hiatal hernia    Outpatient Encounter Medications as of 01/16/2018  Medication Sig  . acetaminophen (TYLENOL) 325 MG tablet Take 650 mg by mouth every 6 (six) hours as needed (pain).  Marland Kitchen ALPRAZolam (XANAX) 1 MG tablet Take 1 mg by mouth at bedtime as needed for anxiety.   Marland Kitchen aspirin EC 81 MG tablet Take 1 tablet (81 mg total) by mouth daily.  . carbamazepine (TEGRETOL) 200 MG tablet Take 200 mg 2 (two) times daily by mouth.  . dexlansoprazole (DEXILANT) 60 MG capsule Take every morning 30 minutes before breakfast or first meal of the day  . gabapentin (NEURONTIN) 400 MG capsule Take 600 mg by mouth 2 (two) times daily.   . hydrocortisone 2.5 % cream Apply 1 application 2 (two) times daily as needed topically (TO NOSE).   . Lactobacillus (PROBIOTIC ACIDOPHILUS PO) Take by mouth daily.  Marland Kitchen losartan-hydrochlorothiazide (HYZAAR) 50-12.5 MG tablet Take 1 tablet by mouth daily.  . ondansetron (ZOFRAN) 4 MG tablet Take 1 tablet (4 mg total) by mouth every 6 (six) hours as needed.  . polyethylene glycol powder (GLYCOLAX/MIRALAX) powder DISSOLVE 1 CAPFUL IN LIQUID EVERY DAY AS NEEDED FOR CONSTIPATION  . ranitidine (ZANTAC) 300 MG tablet Take 1 tablet (300 mg total) by mouth at bedtime. (Patient taking differently: Take 300 mg by mouth as needed for heartburn. )  . sucralfate (CARAFATE) 1 GM/10ML suspension Take 10 mLs (1 g total) by mouth 4 (four) times daily. (Patient taking differently: Take 1 g by mouth as needed. )  . [DISCONTINUED] fluticasone (FLONASE) 50 MCG/ACT nasal spray Place 1 spray daily as needed into both  nostrils for allergies or rhinitis. (Patient not taking: Reported on 11/30/2017)  . [DISCONTINUED] guaiFENesin-dextromethorphan (ROBITUSSIN DM) 100-10 MG/5ML syrup Take 5 mLs every 4 (four) hours as needed by mouth for cough. (Patient not taking: Reported on 12/07/2017)  . [DISCONTINUED] loratadine (CLARITIN) 10 MG tablet Take 1 tablet (10 mg total) daily by mouth.  For allergies (Patient not taking: Reported on 11/30/2017)  . [DISCONTINUED] losartan (COZAAR) 50 MG tablet Take 1 tablet (50 mg total) by mouth daily.  . [DISCONTINUED] pantoprazole (PROTONIX) 40 MG tablet TAKE ONE TABLET BY MOUTH TWICE DAILY BEFORE A MEAL   Facility-Administered Encounter Medications as of 01/16/2018  Medication  . 0.9 %  sodium chloride infusion    Allergies as of 01/16/2018 - Review Complete 01/16/2018  Allergen Reaction Noted  . Dilantin [phenytoin]  07/27/2017  . Dilaudid [hydromorphone hcl]  09/12/2017  . Ace inhibitors Cough 04/02/2008  . Augmentin [amoxicillin-pot clavulanate] Nausea And Vomiting 07/06/2017  . Codeine Nausea And Vomiting 11/10/2014  . Sulfa antibiotics  12/17/2014    Past Medical History:  Diagnosis Date  . Allergic rhinitis   . Anemia, pernicious   . Anxiety and depression    sees Jeffrey. Toy Morgan  . Arthritis   . Catheter-associated urinary tract infection ([name])   . Diverticulosis of colon   . Elevated PSA    and hypogonadism, sees urologist  . Facial asymmetry   . Flatulence/gas pain/belching   . GERD (gastroesophageal reflux disease)    hiatal hernia  . Hiccups   . History of weight loss   . HTN (hypertension)   . Hx of blood transfusion reaction 1999  . Hyperlipidemia   . Hyperparathyroidism ([facility])   . Hyperplastic colon polyp   . Hypertension    acei causes cough  . IBS (irritable bowel syndrome)   . Nephrolithiasis   . Obstructive chronic bronchitis without exacerbation, followed by Jeffrey. Joya Morgan 03/04/2009   ONO RA  Normal 07/28/11 6 min walk >553m no desat PFTs 08/08/11: DLCO 70% TLC 80%  FeV1 80%   Fef 25 75 56% with significant improvement after BD   . Peritonitis ([facility])    after surgery in 1999  . Pneumonia   . PONV (postoperative nausea and vomiting)   . Rectal fissure   . Trigeminal neuralgia    has facial asymetry  . Ventral hernia     Past Surgical History:  Procedure Laterality Date  . ABDOMINAL SURGERY     Multiple   . BRAIN SURGERY  01/2011   Trigeminal nerve/[name]  . CHOLECYSTECTOMY  1981  . HAND SURGERY  1973   Left  . HERNIA REPAIR  1999  . KNEE SURGERY  1997   Left  . LITHOTRIPSY     Right  . NISSEN FUNDOPLICATION  3762    complicated by gastric perforation, peritonitis, ventral nernia   . RHIZOTOMY Right 11/17/2014   Procedure: RHIZOTOMY TO RIGHT V2,V3;  Surgeon: Erline Levine, MD;  Location: [facility];  Service: Neurosurgery;  Laterality: Right;  right  . RHIZOTOMY Right 12/22/2014   Procedure: Right V2 and V3 trigeminal rhysolysis;  Surgeon: Erline Levine, MD;  Location: [facility];  Service: Neurosurgery;  Laterality: Right;  Right V2 and V3 trigeminal rhysolysis  . RHIZOTOMY W/ RADIOFREQUENCY ABLATION  08/2017   Jeffrey [name]  . UPPER GASTROINTESTINAL ENDOSCOPY    . VENTRAL HERNIA REPAIR  2004    Family History  Problem Relation Age of Onset  . Diabetes Mother   . Coronary artery  disease Mother        CABG  . Diabetes Sister   . Diabetes Brother   . Hypertension Brother   . Heart disease Brother   . Clotting disorder Brother   . Heart disease Brother   . Colon cancer Neg Hx   . Esophageal cancer Neg Hx   . Stomach cancer Neg Hx     Social History   Socioeconomic History  . Marital status: Married    Spouse name: Not on file  . Number of children: 2  . Years of education: Not on file  . Highest education level: Not on file  Social Needs  . Financial resource strain: Not on file  . Food insecurity - worry: Not on file  . Food insecurity - inability: Not on file  . Transportation needs - medical: Not on file  . Transportation needs - non-medical: Not on file  Occupational History  . Occupation: Disabled - psych  Tobacco Use  . Smoking status: Former Smoker    Packs/day: 1.00    Years: 20.00    Pack years: 20.00    Types: Cigarettes    Last attempt to quit: 11/06/1996    Years since quitting: 21.2  . Smokeless tobacco: Former Systems developer    Types: [place] date:  11/07/1983  Substance and Sexual Activity  . Alcohol use: No  . Drug use: No  . Sexual activity: Not on file  Other Topics Concern  . Not on file  Social History Narrative   Daily Caffeine Use:  Yes            Review of systems: Review of Systems  Constitutional: Negative for fever and chills.  HENT: Positive sinus problem   Eyes: Negative for blurred vision.  Respiratory: Negative for cough, shortness of breath and wheezing.   Cardiovascular: Negative for chest pain and palpitations.  Gastrointestinal: as per HPI Genitourinary: Negative for dysuria, urgency, frequency and hematuria.  Musculoskeletal: Negative for myalgias, back pain and joint pain.  Skin: Negative for itching and rash.  Neurological: Negative for dizziness, tremors, focal weakness, seizures and loss of consciousness.  Endo/Heme/Allergies: Positive for seasonal allergies.  Psychiatric/Behavioral: Negative for depression, suicidal ideas and hallucinations.  All other systems reviewed and are negative.   Physical Exam: Vitals:   01/16/18 1042  BP: 118/82  Pulse: 68   Body mass index is 30.6 kg/m. Gen:      No acute distress HEENT:  EOMI, sclera anicteric Neck:     No masses; no thyromegaly Lungs:    Clear to auscultation bilaterally; normal respiratory effort CV:         Regular rate and rhythm; no murmurs Abd:      + bowel sounds; soft, non-tender; no palpable masses, no distension Ext:    No edema; adequate peripheral perfusion Skin:      Warm and dry; no rash Neuro: alert and oriented x 3 Psych: normal mood and affect  Data Reviewed:  Reviewed labs, radiology imaging, old records and pertinent past GI work up   Assessment and Plan/Recommendations:  67 year old male with history of chronic GERD status post Nissen fundoplication complicated by postop leak, recurrent hiatal hernia with significant gastroesophageal reflux.  Patient is on high-dose gabapentin and Tegretol for trigeminal  neuralgia. He developed hiccups after his gabapentin dose was drastically tapered down, symptoms improved since his dose was increased back to what he was on chronically 2800 mg daily He is working closely with Jeffrey. Maryjean Morgan  and is planning to taper Tegretol as he feels it is not helping his symptoms related to trigeminal neuralgia and is making him more drowsy He has sliding hiatal hernia and significant gastroesophageal reflux, is not a candidate for redo per Jeffrey. Hassell Done due to his significant postop complications in the past after his first Nissen fundoplication that was done at outside hospital Protonix 40 mg daily, 30 minutes before breakfast Ranitidine 150 mg at bedtime as needed Discussed antireflux measures and lifestyle modification in detail Small frequent meals  Chronic constipation continue MiraLAX daily as needed  Colorectal cancer screening: Increased risk with history of adenomatous colon polyps Due for surveillance colonoscopy 2023  Return in 6 months or sooner if needed  25 minutes was spent face-to-face with the patient. Greater than 50% of the time used for counseling as well as treatment plan and follow-up. He had multiple questions which were answered to his satisfaction  K. Denzil Magnuson , MD [phone] Mon-Fri 8a-5p [phone] after 5p, weekends, holidays  CC: Jeffrey Borg, MD

## 2018-01-24 ENCOUNTER — Encounter: Payer: Self-pay | Admitting: Internal Medicine

## 2018-01-24 ENCOUNTER — Ambulatory Visit (INDEPENDENT_AMBULATORY_CARE_PROVIDER_SITE_OTHER): Payer: Medicare Other | Admitting: Internal Medicine

## 2018-01-24 ENCOUNTER — Other Ambulatory Visit (INDEPENDENT_AMBULATORY_CARE_PROVIDER_SITE_OTHER): Payer: Medicare Other

## 2018-01-24 VITALS — BP 124/86 | HR 73 | Temp 97.7°F | Ht 71.0 in | Wt 217.0 lb

## 2018-01-24 DIAGNOSIS — R739 Hyperglycemia, unspecified: Secondary | ICD-10-CM

## 2018-01-24 DIAGNOSIS — I10 Essential (primary) hypertension: Secondary | ICD-10-CM

## 2018-01-24 DIAGNOSIS — Z23 Encounter for immunization: Secondary | ICD-10-CM

## 2018-01-24 DIAGNOSIS — E871 Hypo-osmolality and hyponatremia: Secondary | ICD-10-CM | POA: Diagnosis not present

## 2018-01-24 DIAGNOSIS — N320 Bladder-neck obstruction: Secondary | ICD-10-CM | POA: Diagnosis not present

## 2018-01-24 LAB — CBC WITH DIFFERENTIAL/PLATELET
BASOS ABS: 0 10*3/uL (ref 0.0–0.1)
BASOS PCT: 0.6 % (ref 0.0–3.0)
EOS ABS: 0.1 10*3/uL (ref 0.0–0.7)
Eosinophils Relative: 0.8 % (ref 0.0–5.0)
HCT: 47.4 % (ref 39.0–52.0)
HEMOGLOBIN: 16.6 g/dL (ref 13.0–17.0)
LYMPHS PCT: 30.3 % (ref 12.0–46.0)
Lymphs Abs: 1.9 10*3/uL (ref 0.7–4.0)
MCHC: 35.1 g/dL (ref 30.0–36.0)
MCV: 88 fl (ref 78.0–100.0)
MONOS PCT: 7.6 % (ref 3.0–12.0)
Monocytes Absolute: 0.5 10*3/uL (ref 0.1–1.0)
NEUTROS ABS: 3.9 10*3/uL (ref 1.4–7.7)
Neutrophils Relative %: 60.7 % (ref 43.0–77.0)
PLATELETS: 155 10*3/uL (ref 150.0–400.0)
RBC: 5.39 Mil/uL (ref 4.22–5.81)
RDW: 13.4 % (ref 11.5–15.5)
WBC: 6.3 10*3/uL (ref 4.0–10.5)

## 2018-01-24 LAB — BASIC METABOLIC PANEL
BUN: 10 mg/dL (ref 6–23)
CALCIUM: 10 mg/dL (ref 8.4–10.5)
CHLORIDE: 97 meq/L (ref 96–112)
CO2: 30 mEq/L (ref 19–32)
CREATININE: 0.91 mg/dL (ref 0.40–1.50)
GFR: 88.7 mL/min (ref >60.00)
Glucose, Bld: 107 mg/dL — ABNORMAL HIGH (ref 70–99)
Potassium: 4.5 mEq/L (ref 3.5–5.1)
Sodium: 137 mEq/L (ref 135–145)

## 2018-01-24 LAB — URINALYSIS, ROUTINE W REFLEX MICROSCOPIC
Bilirubin Urine: NEGATIVE
HGB URINE DIPSTICK: NEGATIVE
Ketones, ur: NEGATIVE
Leukocytes, UA: NEGATIVE
Nitrite: NEGATIVE
PH: 7.5 (ref 5.0–8.0)
RBC / HPF: NONE SEEN (ref 0–?)
Specific Gravity, Urine: 1.01 (ref 1.000–1.030)
TOTAL PROTEIN, URINE-UPE24: NEGATIVE
Urine Glucose: NEGATIVE
Urobilinogen, UA: 0.2 (ref 0.0–1.0)
WBC, UA: NONE SEEN (ref 0–?)

## 2018-01-24 LAB — HEPATIC FUNCTION PANEL
ALBUMIN: 4.7 g/dL (ref 3.5–5.2)
ALK PHOS: 67 U/L (ref 39–117)
ALT: 19 U/L (ref 0–53)
AST: 18 U/L (ref 0–37)
BILIRUBIN DIRECT: 0.2 mg/dL (ref 0.0–0.3)
TOTAL PROTEIN: 7.2 g/dL (ref 6.0–8.3)
Total Bilirubin: 0.7 mg/dL (ref 0.2–1.2)

## 2018-01-24 LAB — LIPID PANEL
CHOLESTEROL: 161 mg/dL (ref 0–200)
HDL: 53.9 mg/dL (ref >39.00)
LDL CALC: 85 mg/dL (ref 0–99)
NonHDL: 107.06
TRIGLYCERIDES: 111 mg/dL (ref 0.0–149.0)
Total CHOL/HDL Ratio: 3
VLDL: 22.2 mg/dL (ref 0.0–40.0)

## 2018-01-24 LAB — PSA: PSA: 9.58 ng/mL — AB (ref 0.10–4.00)

## 2018-01-24 LAB — TSH: TSH: 1.76 u[IU]/mL (ref 0.35–4.50)

## 2018-01-24 LAB — HEMOGLOBIN A1C: HEMOGLOBIN A1C: 5.4 % (ref 4.6–6.5)

## 2018-01-24 NOTE — Patient Instructions (Addendum)
You had the Prevnar pneumonia shot today  Please continue all other medications as before, and refills have been done if requested.  Please have the pharmacy call with any other refills you may need.  Please continue your efforts at being more active, low cholesterol diet, and weight control.  You are otherwise up to date with prevention measures today.  Please keep your appointments with your specialists as you may have planned  Please go to the LAB in the Basement (turn left off the elevator) for the tests to be done today  You will be contacted by phone if any changes need to be made immediately.  Otherwise, you will receive a letter about your results with an explanation, but please check with MyChart first.  Please remember to sign up for MyChart if you have not done so, as this will be important to you in the future with finding out test results, communicating by private email, and scheduling acute appointments online when needed.  Please return in 1 year for your yearly visit, or sooner if needed

## 2018-01-24 NOTE — Progress Notes (Signed)
Subjective:    Patient ID: [name], male    DOB: 1951-02-03, 67 y.o.   MRN: [id number]  HPI   Here for yearly f/u;  Overall doing ok;  Pt denies Chest pain, worsening SOB, DOE, wheezing, orthopnea, PND, worsening LE edema, palpitations, dizziness or syncope.  Pt denies neurological change such as new headache, facial or extremity weakness.  Pt denies polydipsia, polyuria, or low sugar symptoms. Pt states overall good compliance with treatment and medications, good tolerability, and has been trying to follow appropriate diet.  Pt denies worsening depressive symptoms, suicidal ideation or panic. No fever, night sweats, wt loss, loss of appetite, or other constitutional symptoms.  Pt states good ability with ADL's, has low fall risk, home safety reviewed and adequate, no other significant changes in hearing or vision, and not active with exercise. Has lost wt from 241 to current over 1 yr, somewhat due to gastritis per pt, followed per GI.  Wt Readings from Last 3 Encounters:  01/24/18 217 lb (98.4 kg)  01/16/18 219 lb 6.4 oz (99.5 kg)  12/07/17 223 lb (101.2 kg)   Past Medical History:  Diagnosis Date  . Allergic rhinitis   . Anemia, pernicious   . Anxiety and depression    sees Dr. [name]  . Arthritis   . Catheter-associated urinary tract infection ([facility])   . Diverticulosis of colon   . Elevated PSA    and hypogonadism, sees urologist  . Facial asymmetry   . Flatulence/gas pain/belching   . GERD (gastroesophageal reflux disease)    hiatal hernia  . Hiccups   . History of weight loss   . HTN (hypertension)   . Hx of blood transfusion reaction 1999  . Hyperlipidemia   . Hyperparathyroidism ([facility])   . Hyperplastic colon polyp   . Hypertension    acei causes cough  . IBS (irritable bowel syndrome)   . Nephrolithiasis   . Obstructive chronic bronchitis without exacerbation, followed by Dr. [name] 03/04/2009   ONO RA  Normal 07/28/11 6 min walk >571m no desat PFTs 08/08/11: DLCO  70% TLC 80%  FeV1 80%   Fef 25 75 56% with significant improvement after BD   . Peritonitis ([facility])    after surgery in 1999  . Pneumonia   . PONV (postoperative nausea and vomiting)   . Rectal fissure   . Trigeminal neuralgia    has facial asymetry  . Ventral hernia    Past Surgical History:  Procedure Laterality Date  . ABDOMINAL SURGERY     Multiple  . BRAIN SURGERY  01/2011   Trigeminal nerve/Dr Vertell Limber  . CHOLECYSTECTOMY  1981  . HAND SURGERY  1973   Left  . HERNIA REPAIR  1999  . KNEE SURGERY  1997   Left  . LITHOTRIPSY     Right  . NISSEN FUNDOPLICATION  5176    complicated by gastric perforation, peritonitis, ventral nernia   . RHIZOTOMY Right 11/17/2014   Procedure: RHIZOTOMY TO RIGHT V2,V3;  Surgeon: Erline Levine, MD;  Location: [facility];  Service: Neurosurgery;  Laterality: Right;  right  . RHIZOTOMY Right 12/22/2014   Procedure: Right V2 and V3 trigeminal rhysolysis;  Surgeon: Erline Levine, MD;  Location: [facility];  Service: Neurosurgery;  Laterality: Right;  Right V2 and V3 trigeminal rhysolysis  . RHIZOTOMY W/ RADIOFREQUENCY ABLATION  08/2017   Dr [name]  . UPPER GASTROINTESTINAL ENDOSCOPY    . VENTRAL HERNIA REPAIR  2004    reports that  he quit smoking about 21 years ago. His smoking use included cigarettes. He has a 20.00 pack-year smoking history. He quit smokeless tobacco use about 34 years ago. His smokeless tobacco use included chew. He reports that he does not drink alcohol or use drugs. family history includes Clotting disorder in his brother; Coronary artery disease in his mother; Diabetes in his brother, mother, and sister; Heart disease in his brother and brother; Hypertension in his brother. Allergies  Allergen Reactions  . Dilantin [Phenytoin]     bradycardia  . Dilaudid [Hydromorphone Hcl]   . Ace Inhibitors Cough  . Augmentin [Amoxicillin-Pot Clavulanate] Nausea And Vomiting  . Codeine Nausea And Vomiting  . Sulfa Antibiotics     hives    Current Outpatient Medications on File Prior to Visit  Medication Sig Dispense Refill  . ALPRAZolam (XANAX) 1 MG tablet Take 1 mg by mouth at bedtime as needed for anxiety.     Marland Kitchen aspirin EC 81 MG tablet Take 1 tablet (81 mg total) by mouth daily. 90 tablet 11  . carbamazepine (TEGRETOL) 200 MG tablet Take 200 mg 2 (two) times daily by mouth.  2  . gabapentin (NEURONTIN) 400 MG capsule Take 600 mg by mouth 2 (two) times daily.     . hydrocortisone 2.5 % cream Apply 1 application 2 (two) times daily as needed topically (TO NOSE).   0  . Lactobacillus (PROBIOTIC ACIDOPHILUS PO) Take by mouth daily.    Marland Kitchen losartan-hydrochlorothiazide (HYZAAR) 50-12.5 MG tablet Take 1 tablet by mouth daily. 90 tablet 2  . pantoprazole (PROTONIX) 40 MG tablet Take 1 tablet (40 mg total) by mouth daily. 90 tablet 3  . polyethylene glycol powder (GLYCOLAX/MIRALAX) powder DISSOLVE 1 CAPFUL IN LIQUID EVERY DAY AS NEEDED FOR CONSTIPATION 527 g 2  . ranitidine (ZANTAC) 150 MG tablet Take 1 tablet (150 mg total) by mouth at bedtime. 90 tablet 3  . sucralfate (CARAFATE) 1 GM/10ML suspension Take 10 mLs (1 g total) by mouth 4 (four) times daily. (Patient taking differently: Take 1 g by mouth as needed. ) 420 mL 1  . [DISCONTINUED] losartan (COZAAR) 50 MG tablet Take 1 tablet (50 mg total) by mouth daily. 30 tablet 6   Current Facility-Administered Medications on File Prior to Visit  Medication Dose Route Frequency Provider Last Rate Last Dose  . 0.9 %  sodium chloride infusion  500 mL Intravenous Once Nandigam, Kavitha V, MD       Review of Systems Constitutional: Negative for other unusual diaphoresis, sweats, appetite or weight changes HENT: Negative for other worsening hearing loss, ear pain, facial swelling, mouth sores or neck stiffness.   Eyes: Negative for other worsening pain, redness or other visual disturbance.  Respiratory: Negative for other stridor or swelling Cardiovascular: Negative for other palpitations  or other chest pain  Gastrointestinal: Negative for worsening diarrhea or loose stools, blood in stool, distention or other pain Genitourinary: Negative for hematuria, flank pain or other change in urine volume.  Musculoskeletal: Negative for myalgias or other joint swelling.  Skin: Negative for other color change, or other wound or worsening drainage.  Neurological: Negative for other syncope or numbness. Hematological: Negative for other adenopathy or swelling Psychiatric/Behavioral: Negative for hallucinations, other worsening agitation, SI, self-injury, or new decreased concentration All other system neg per pt    Objective:   Physical Exam BP 124/86   Pulse 73   Temp 97.7 F (36.5 C) (Oral)   Ht 5\' 11"  (1.803 m)   Wt 217  lb (98.4 kg)   SpO2 96%   BMI 30.27 kg/m  VS noted,  Constitutional: Pt is oriented to person, place, and time. Appears well-developed and well-nourished, in no significant distress and comfortable Head: Normocephalic and atraumatic  Eyes: Conjunctivae and EOM are normal. Pupils are equal, round, and reactive to light Right Ear: External ear normal without discharge Left Ear: External ear normal without discharge Nose: Nose without discharge or deformity Mouth/Throat: Oropharynx is without other ulcerations and moist  Neck: Normal range of motion. Neck supple. No JVD present. No tracheal deviation present or significant neck LA or mass Cardiovascular: Normal rate, regular rhythm, normal heart sounds and intact distal pulses.   Pulmonary/Chest: WOB normal and breath sounds without rales or wheezing  Abdominal: Soft. Bowel sounds are normal. NT. No HSM  Musculoskeletal: Normal range of motion. Exhibits no edema Lymphadenopathy: Has no other cervical adenopathy.  Neurological: Pt is alert and oriented to person, place, and time. Pt has normal reflexes. No cranial nerve deficit. Motor grossly intact, Gait intact Skin: Skin is warm and dry. No rash noted or new  ulcerations Psychiatric:  Has normal mood and affect. Behavior is normal without agitation No other exam findings    Assessment & Plan:

## 2018-01-26 ENCOUNTER — Encounter: Payer: Self-pay | Admitting: Internal Medicine

## 2018-01-26 NOTE — Assessment & Plan Note (Signed)
stable overall by history and exam, recent data reviewed with pt, and pt to continue medical treatment as before,  to f/u any worsening symptoms or concerns BP Readings from Last 3 Encounters:  01/24/18 124/86  01/16/18 118/82  12/07/17 (!) 162/98

## 2018-01-26 NOTE — Assessment & Plan Note (Signed)
Mild to mod, for f/u lab today,  to f/u any worsening symptoms or concerns

## 2018-01-26 NOTE — Assessment & Plan Note (Signed)
Lab Results  Component Value Date   HGBA1C 5.4 01/24/2018  stable overall by history and exam, recent data reviewed with pt, and pt to continue medical treatment as before,  to f/u any worsening symptoms or concerns

## 2018-02-04 DIAGNOSIS — H33103 Unspecified retinoschisis, bilateral: Secondary | ICD-10-CM | POA: Diagnosis not present

## 2018-02-04 DIAGNOSIS — H35373 Puckering of macula, bilateral: Secondary | ICD-10-CM | POA: Diagnosis not present

## 2018-02-04 DIAGNOSIS — H35423 Microcystoid degeneration of retina, bilateral: Secondary | ICD-10-CM | POA: Diagnosis not present

## 2018-02-04 DIAGNOSIS — H3562 Retinal hemorrhage, left eye: Secondary | ICD-10-CM | POA: Diagnosis not present

## 2018-02-20 DIAGNOSIS — R030 Elevated blood-pressure reading, without diagnosis of hypertension: Secondary | ICD-10-CM | POA: Diagnosis not present

## 2018-02-20 DIAGNOSIS — G500 Trigeminal neuralgia: Secondary | ICD-10-CM | POA: Diagnosis not present

## 2018-02-20 DIAGNOSIS — Z6830 Body mass index (BMI) 30.0-30.9, adult: Secondary | ICD-10-CM | POA: Diagnosis not present

## 2018-03-06 DIAGNOSIS — G500 Trigeminal neuralgia: Secondary | ICD-10-CM | POA: Diagnosis not present

## 2018-03-08 DIAGNOSIS — R142 Eructation: Secondary | ICD-10-CM | POA: Diagnosis not present

## 2018-03-22 ENCOUNTER — Other Ambulatory Visit: Payer: Self-pay | Admitting: Internal Medicine

## 2018-04-09 DIAGNOSIS — R972 Elevated prostate specific antigen [PSA]: Secondary | ICD-10-CM | POA: Diagnosis not present

## 2018-04-16 DIAGNOSIS — N400 Enlarged prostate without lower urinary tract symptoms: Secondary | ICD-10-CM | POA: Diagnosis not present

## 2018-04-16 DIAGNOSIS — R972 Elevated prostate specific antigen [PSA]: Secondary | ICD-10-CM | POA: Diagnosis not present

## 2018-06-03 ENCOUNTER — Telehealth: Payer: Self-pay | Admitting: Gastroenterology

## 2018-06-03 NOTE — Telephone Encounter (Signed)
Please check if he had an other medication changes as his symptoms were previously due to change in Gabapentin dosing. Agree with plan

## 2018-06-03 NOTE — Telephone Encounter (Signed)
Called the patient again. Confirmed he has not had any change in medication or dosage of her medications. Patient would like to try Dexilant again.

## 2018-06-03 NOTE — Telephone Encounter (Signed)
Spoke with the spouse of the patient. I can hear him talking in the background.  Past 2 weeks he has had a return of his burning and burping. He feels nauseous. Denies any change in diet. Taking OTC Nexium 20 mg 2 in the morning. Zantac 150 mg he was taking only QOD at bedtime. I instructed him to resume taking Zantac nightly as ordered. He has restarted Carafate because he had some left from the last time he needed it. [name] is giving him her Zofran for nausea today.His follow up appointment with you is 07/16/18. Please advise.

## 2018-06-04 ENCOUNTER — Other Ambulatory Visit: Payer: Self-pay

## 2018-06-04 DIAGNOSIS — K219 Gastro-esophageal reflux disease without esophagitis: Secondary | ICD-10-CM

## 2018-06-04 MED ORDER — DEXLANSOPRAZOLE 60 MG PO CPDR: 60.0000 mg | DELAYED_RELEASE_CAPSULE | Freq: Every day | ORAL | 3 refills | Status: DC

## 2018-06-04 MED ORDER — SUCRALFATE 1 G PO TABS: 1.0000 g | ORAL_TABLET | Freq: Four times a day (QID) | ORAL | 2 refills | Status: DC

## 2018-06-04 NOTE — Telephone Encounter (Signed)
Called to tlet the patient know the Rx for Dexilant has been sent to the pharmacy. Has another question. Can he also have more Carafate? Wants tablets.

## 2018-06-04 NOTE — Telephone Encounter (Signed)
I sent his refill request to his pharmacy.

## 2018-06-04 NOTE — Telephone Encounter (Signed)
Ok, please send Dexilant 60mg  daily X 30 days with 3 refills. Thanks

## 2018-06-04 NOTE — Telephone Encounter (Signed)
Yes okay to send refill for Carafate tablets 4 times daily as needed

## 2018-06-10 ENCOUNTER — Other Ambulatory Visit: Payer: Self-pay | Admitting: Gastroenterology

## 2018-06-23 IMAGING — CT CT ABD-PELV W/ CM
2 of 5 series · 16 of 46 positions shown, 18 images · IV contrast (ISOVUE 300)
Comparison: CT 09/12/2017

CLINICAL DATA: Bloating, abdominal pain

EXAM:
CT ABDOMEN AND PELVIS WITH CONTRAST
TECHNIQUE: Multidetector CT imaging of the abdomen and pelvis was performed
using the standard protocol following bolus administration of
intravenous contrast.
CONTRAST:  100mL Q3AKVD-4EE IOPAMIDOL (Q3AKVD-4EE) INJECTION 61%

[Series 2: abd/pel w · axial · 0.87mm/px · z∈[-526,-61]mm · 13 of 105 slices shown, 15 images]
[im 6/105  soft-tissue]
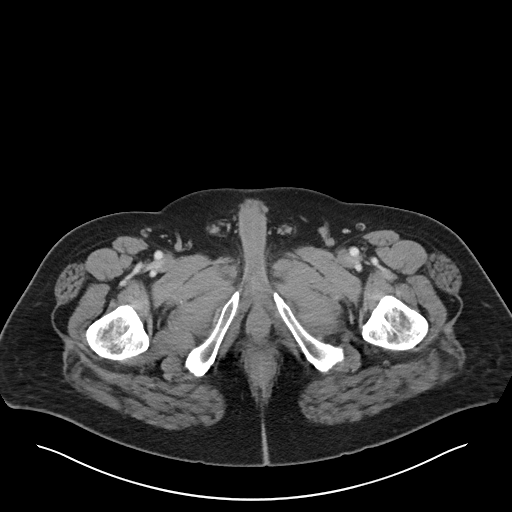
[im 6/105  bone]
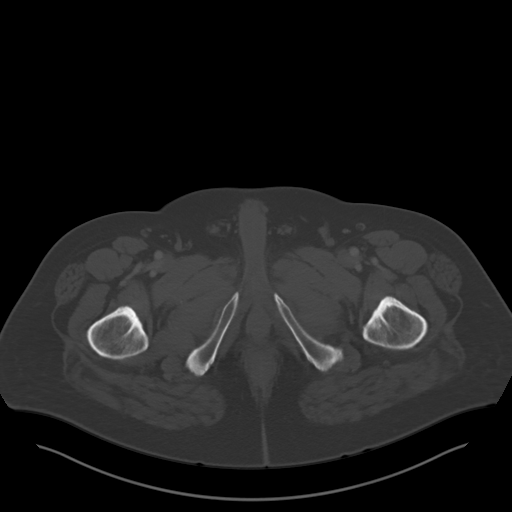
[im 17/105  soft-tissue]
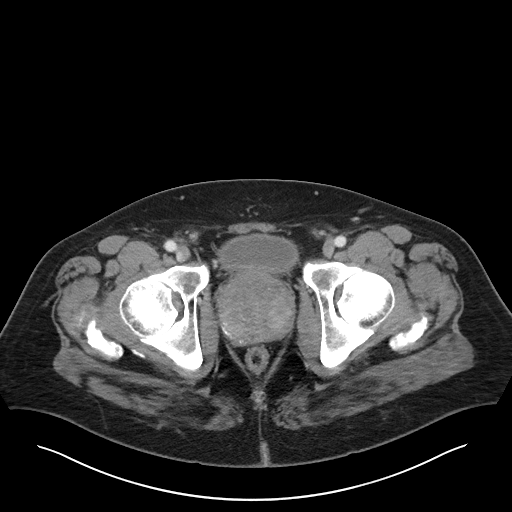
[im 22/105  soft-tissue]
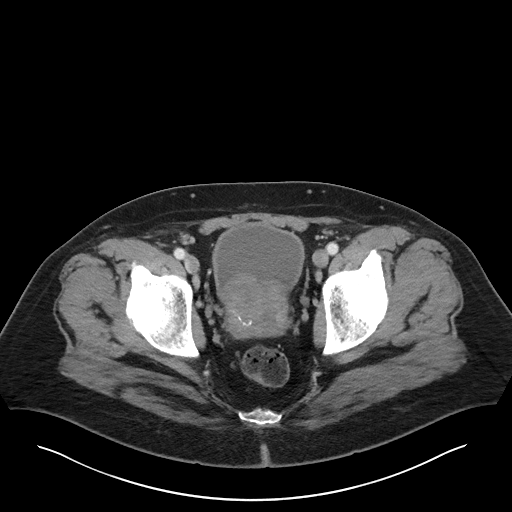
[im 28/105  soft-tissue]
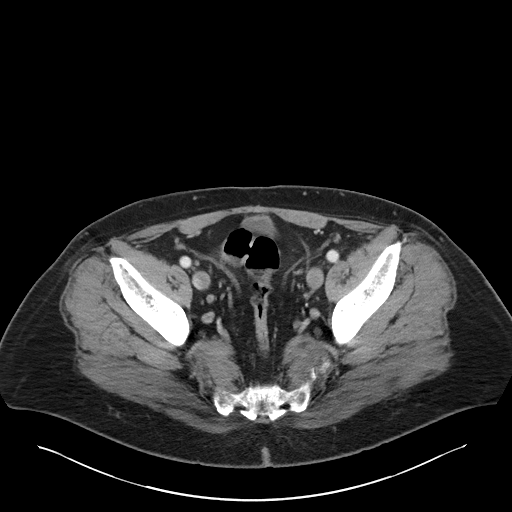
[im 39/105  soft-tissue]
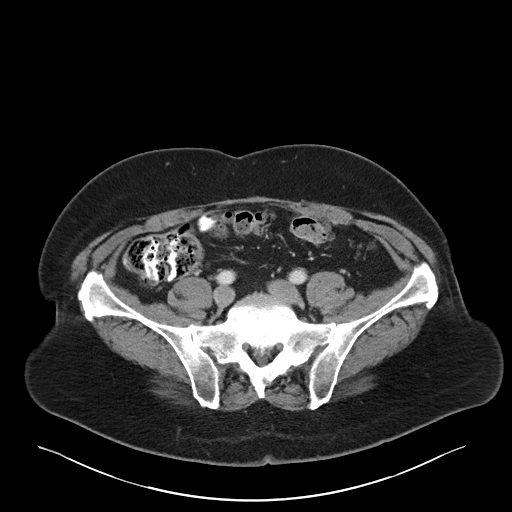
[im 44/105  soft-tissue]
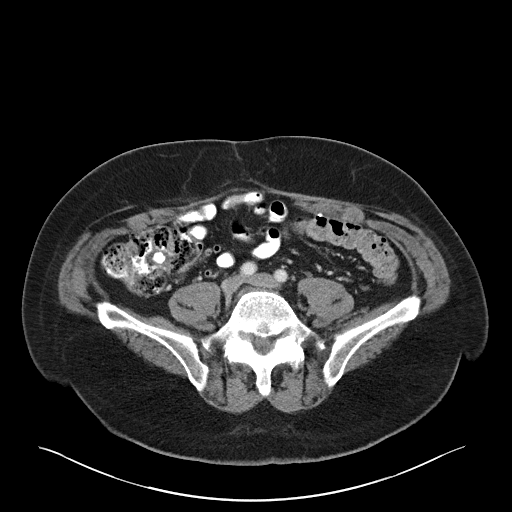
[im 55/105  soft-tissue]
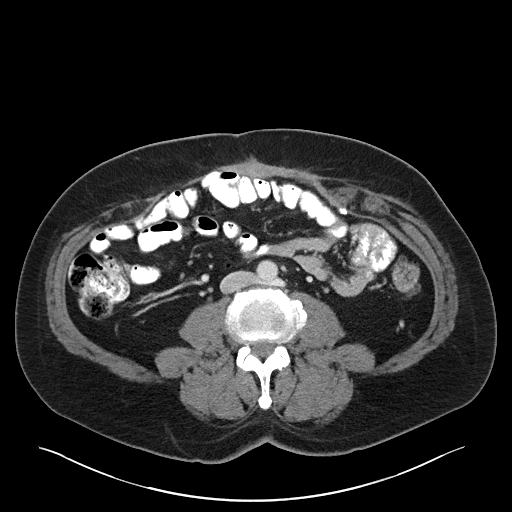
[im 61/105  soft-tissue]
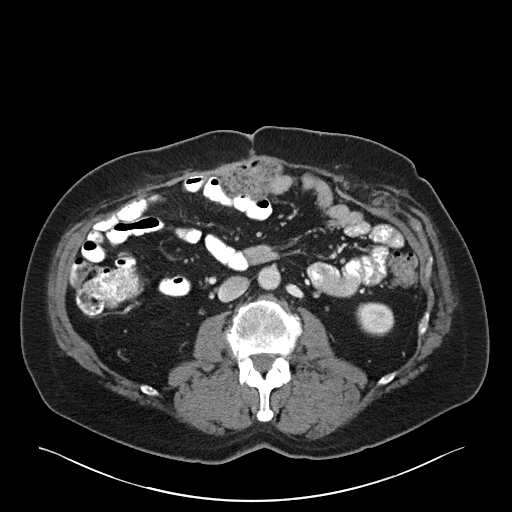
[im 66/105  soft-tissue]
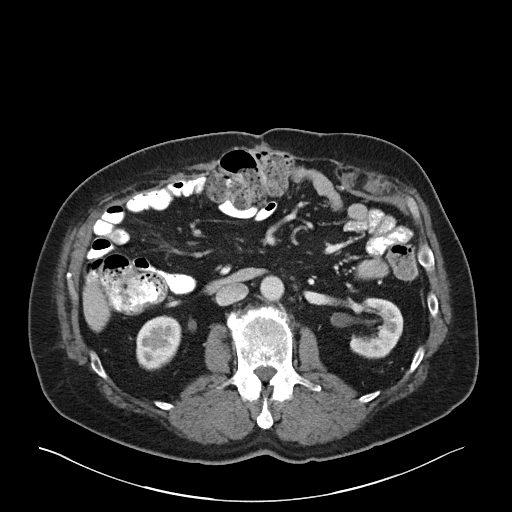
[im 66/105  bone]
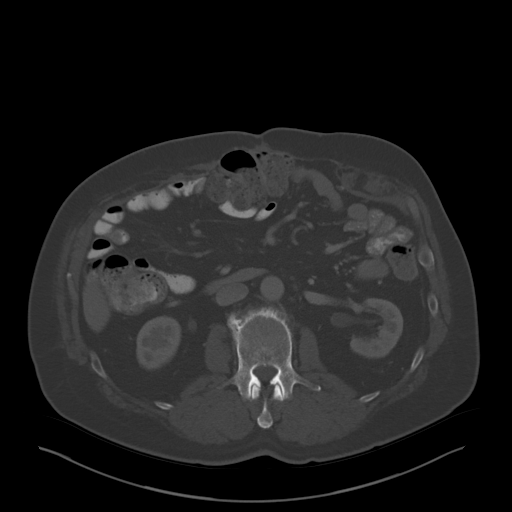
[im 77/105  soft-tissue]
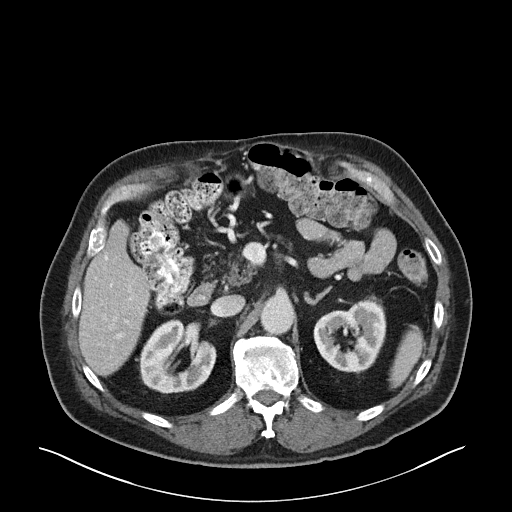
[im 83/105  soft-tissue]
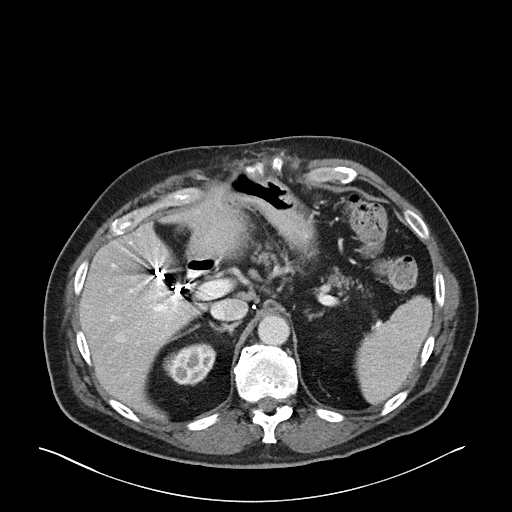
[im 88/105  soft-tissue]
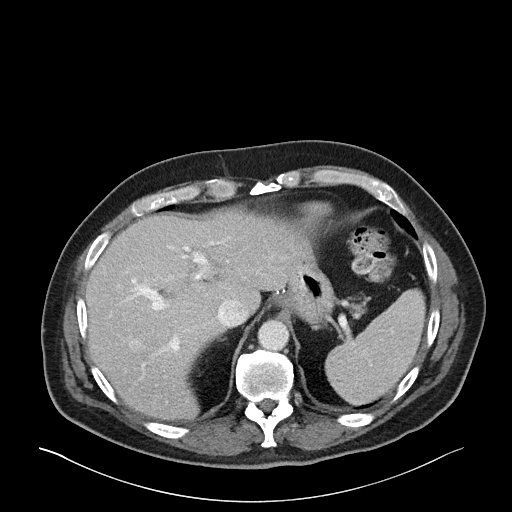
[im 99/105  soft-tissue]
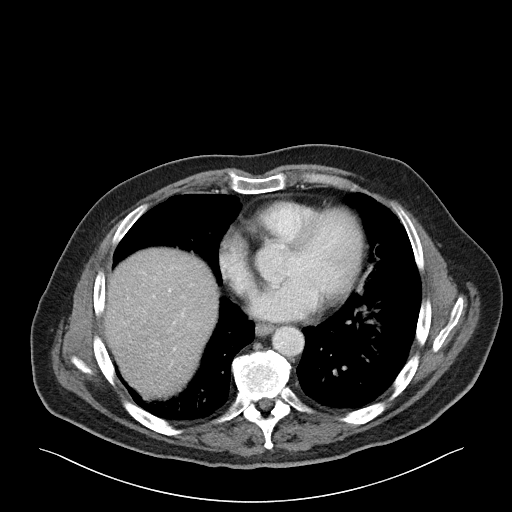

[Series 6: abd/pel w st · coronal · 0.80mm/px · 3 of 88 slices shown]
[im 30/88  soft-tissue]
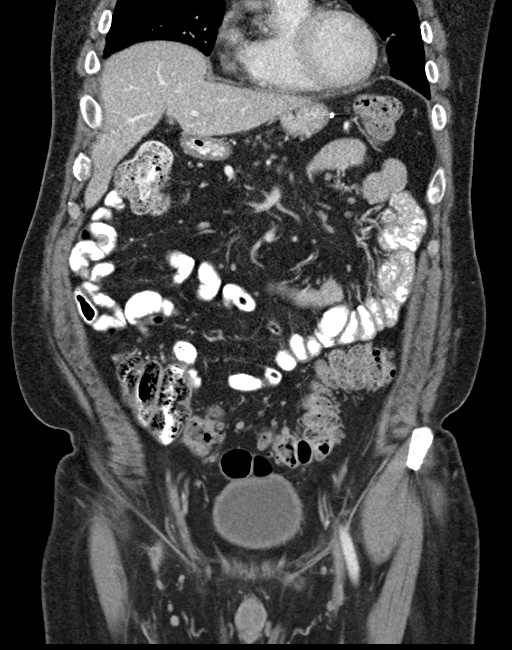
[im 39/88  soft-tissue]
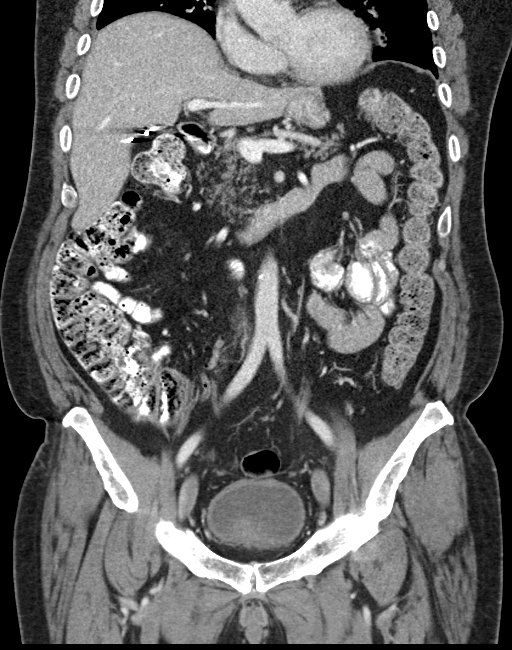
[im 49/88  soft-tissue]
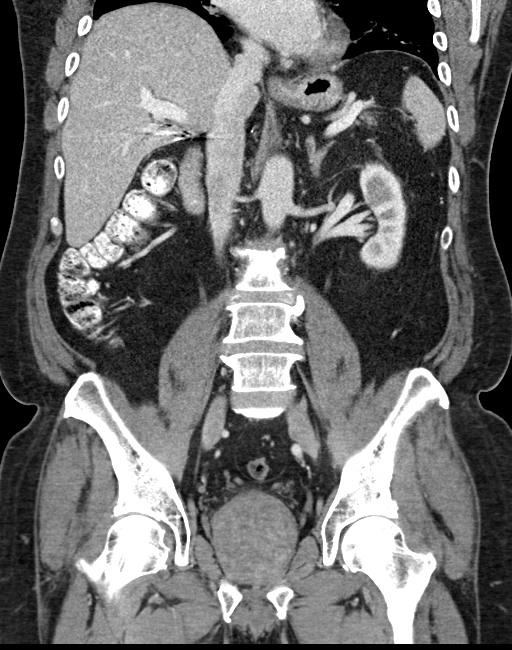

[16 of 46 positions shown; findings below may reference images not displayed]

FINDINGS: Lower chest: Continued airspace disease in the right middle lobe,
lingula and left lower lobe, less pronounced than prior study
compatible with improving pneumonia. No effusions. Heart is normal
size.

Hepatobiliary: Prior cholecystectomy.  No focal hepatic abnormality.

Pancreas: No focal abnormality or ductal dilatation.

Spleen: No focal abnormality.  Normal size.

Adrenals/Urinary Tract: No renal mass or hydronephrosis. Punctate
nonobstructing left upper pole renal stone again noted, unchanged.
Adrenal glands and urinary bladder unremarkable.

Stomach/Bowel: Sigmoid diverticulosis. No active diverticulitis.
Appendix is normal. Stomach and small bowel decompressed.

Vascular/Lymphatic: No evidence of aneurysm or adenopathy.

Reproductive: Marked prostate enlargement, stable.

Other: No free fluid or free air.

Musculoskeletal: No acute bony abnormality.
IMPRESSION: Improving but persistent. Airspace disease in the lung bases, left
greater than right compatible with improving pneumonia.

Mild left colonic diverticulosis.  No active diverticulitis.

Prostate enlargement.

No acute findings in the abdomen or pelvis.

## 2018-07-16 ENCOUNTER — Ambulatory Visit (INDEPENDENT_AMBULATORY_CARE_PROVIDER_SITE_OTHER): Payer: Medicare Other | Admitting: Gastroenterology

## 2018-07-16 ENCOUNTER — Encounter: Payer: Self-pay | Admitting: Gastroenterology

## 2018-07-16 VITALS — BP 120/74 | HR 70 | Ht 71.0 in | Wt 215.4 lb

## 2018-07-16 DIAGNOSIS — K219 Gastro-esophageal reflux disease without esophagitis: Secondary | ICD-10-CM

## 2018-07-16 DIAGNOSIS — R1013 Epigastric pain: Secondary | ICD-10-CM

## 2018-07-16 DIAGNOSIS — Z9889 Other specified postprocedural states: Secondary | ICD-10-CM | POA: Diagnosis not present

## 2018-07-16 DIAGNOSIS — K449 Diaphragmatic hernia without obstruction or gangrene: Secondary | ICD-10-CM | POA: Diagnosis not present

## 2018-07-16 MED ORDER — SUCRALFATE 1 G PO TABS: 1.0000 g | ORAL_TABLET | Freq: Three times a day (TID) | ORAL | 2 refills | Status: DC

## 2018-07-16 MED ORDER — RANITIDINE HCL 300 MG PO TABS: 300.0000 mg | ORAL_TABLET | Freq: Every day | ORAL | 3 refills | Status: DC

## 2018-07-16 MED ORDER — PANTOPRAZOLE SODIUM 40 MG PO TBEC: 40.0000 mg | DELAYED_RELEASE_TABLET | Freq: Every day | ORAL | 3 refills | Status: DC

## 2018-07-16 NOTE — Progress Notes (Signed)
[name]    [id number]    1951/03/08  Primary Care Physician:[name], [name], MD  Referring Physician: [name], MD [place], [place] [address]  Chief complaint: GERD  HPI: 67 year old with history of chronic GERD status post Nissen fundoplication complicated by postop leak, recurrent hiatal hernia here for follow up visit.He took augmentin for 3 days after dental procedure, felt GERD symptoms were worse and was also having epigastric discomfort. He started back on Dexilant and feels better. He is also taking Carafate with significant relief of his symptoms.  No longer having frequent hiccups.  Denies nausea, vomiting, dysphagia, odynophagia, abdominal pain, change in bowel habits, melena or blood per rectum.   Outpatient Encounter Medications as of 07/16/2018  Medication Sig  . ALPRAZolam (XANAX) 1 MG tablet Take 1 mg by mouth at bedtime as needed for anxiety.   . carbamazepine (TEGRETOL) 200 MG tablet Take 200 mg 2 (two) times daily by mouth.  . dexlansoprazole (DEXILANT) 60 MG capsule Take 1 capsule (60 mg total) by mouth daily. (Patient taking differently: Take 60 mg by mouth daily as needed. )  . gabapentin (NEURONTIN) 400 MG capsule Take 600 mg by mouth 2 (two) times daily.   Marland Kitchen losartan-hydrochlorothiazide (HYZAAR) 50-12.5 MG tablet TAKE ONE TABLET BY MOUTH EVERY DAY  . pantoprazole (PROTONIX) 40 MG tablet Take 40 mg by mouth daily as needed.  . polyethylene glycol powder (GLYCOLAX/MIRALAX) powder DISSOLVE 1 CAPFUL IN LIQUID EVERY DAY AS NEEDED FOR CONSTIPATION  . Probiotic Product (ALIGN) 4 MG CAPS Take by mouth daily.  . ranitidine (ZANTAC) 150 MG tablet Take 1 tablet (150 mg total) by mouth at bedtime.  . ranitidine (ZANTAC) 300 MG tablet TAKE 1 TABLET BY MOUTH AT BEDTIME  . sucralfate (CARAFATE) 1 g tablet Take 1 tablet (1 g total) by mouth 4 (four) times daily.  . [DISCONTINUED] aspirin EC 81 MG tablet Take 1 tablet (81 mg total)  by mouth daily.  . [DISCONTINUED] hydrocortisone 2.5 % cream Apply 1 application 2 (two) times daily as needed topically (TO NOSE).   . [DISCONTINUED] Lactobacillus (PROBIOTIC ACIDOPHILUS PO) Take by mouth daily.  . [DISCONTINUED] losartan (COZAAR) 50 MG tablet Take 1 tablet (50 mg total) by mouth daily.   Facility-Administered Encounter Medications as of 07/16/2018  Medication  . 0.9 %  sodium chloride infusion    Allergies as of 07/16/2018 - Review Complete 07/16/2018  Allergen Reaction Noted  . Dilantin [phenytoin]  07/27/2017  . Dilaudid [hydromorphone hcl]  09/12/2017  . Ace inhibitors Cough 04/02/2008  . Augmentin [amoxicillin-pot clavulanate] Nausea And Vomiting 07/06/2017  . Codeine Nausea And Vomiting 11/10/2014  . Sulfa antibiotics  12/17/2014    Past Medical History:  Diagnosis Date  . Allergic rhinitis   . Anemia, pernicious   . Anxiety and depression    sees Dr. [name]  . Arthritis   . Catheter-associated urinary tract infection ([place])   . Diverticulosis of colon   . Elevated PSA    and hypogonadism, sees urologist  . Facial asymmetry   . Flatulence/gas pain/belching   . GERD (gastroesophageal reflux disease)    hiatal hernia  . Hiccups   . History of weight loss   . HTN (hypertension)   . Hx of blood transfusion reaction 1999  . Hyperlipidemia   . Hyperparathyroidism ([place])   . Hyperplastic colon polyp   . Hypertension    acei causes cough  .  IBS (irritable bowel syndrome)   . Nephrolithiasis   . Obstructive chronic bronchitis without exacerbation, followed by Dr. [name] 03/04/2009   ONO RA  Normal 07/28/11 6 min walk >575m no desat PFTs 08/08/11: DLCO 70% TLC 80%  FeV1 80%   Fef 25 75 56% with significant improvement after BD   . Peritonitis ([place])    after surgery in 1999  . Pneumonia   . PONV (postoperative nausea and vomiting)   . Rectal fissure   . Trigeminal neuralgia    has facial asymetry  . Ventral hernia     Past Surgical History:  Procedure  Laterality Date  . ABDOMINAL SURGERY     Multiple  . BRAIN SURGERY  01/2011   Trigeminal nerve/Dr Vertell Limber  . CHOLECYSTECTOMY  1981  . DENTAL SURGERY  2019   tooth extraction, root canal  . HAND SURGERY  1973   Left  . HERNIA REPAIR  1999  . KNEE SURGERY  1997   Left  . LITHOTRIPSY     Right  . NISSEN FUNDOPLICATION  1696    complicated by gastric perforation, peritonitis, ventral nernia   . RHIZOTOMY Right 11/17/2014   Procedure: RHIZOTOMY TO RIGHT V2,V3;  Surgeon: Erline Levine, MD;  Location: [facility];  Service: Neurosurgery;  Laterality: Right;  right  . RHIZOTOMY Right 12/22/2014   Procedure: Right V2 and V3 trigeminal rhysolysis;  Surgeon: Erline Levine, MD;  Location: [facility];  Service: Neurosurgery;  Laterality: Right;  Right V2 and V3 trigeminal rhysolysis  . RHIZOTOMY W/ RADIOFREQUENCY ABLATION  08/2017   Dr [name]  . UPPER GASTROINTESTINAL ENDOSCOPY    . VENTRAL HERNIA REPAIR  2004    Family History  Problem Relation Age of Onset  . Diabetes Mother   . Coronary artery disease Mother        CABG  . Diabetes Sister   . Diabetes Brother   . Hypertension Brother   . Heart disease Brother   . Clotting disorder Brother   . Heart disease Brother   . Colon cancer Neg Hx   . Esophageal cancer Neg Hx   . Stomach cancer Neg Hx     Social History   Socioeconomic History  . Marital status: Married    Spouse name: Not on file  . Number of children: 2  . Years of education: Not on file  . Highest education level: Not on file  Occupational History  . Occupation: Disabled - psych  Social Needs  . Financial resource strain: Not on file  . Food insecurity:    Worry: Not on file    Inability: Not on file  . Transportation needs:    Medical: Not on file    Non-medical: Not on file  Tobacco Use  . Smoking status: Former Smoker    Packs/day: 1.00    Years: 20.00    Pack years: 20.00    Types: Cigarettes    Last attempt to quit: 11/06/1996    Years since  quitting: 21.7  . Smokeless tobacco: Former Systems developer    Types: [place] date: 11/07/1983  Substance and Sexual Activity  . Alcohol use: No  . Drug use: No  . Sexual activity: Yes    Partners: Female  Lifestyle  . Physical activity:    Days per week: Not on file    Minutes per session: Not on file  . Stress: Not on file  Relationships  . Social connections:    Talks  on phone: Not on file    Gets together: Not on file    Attends religious service: Not on file    Active member of club or organization: Not on file    Attends meetings of clubs or organizations: Not on file    Relationship status: Not on file  . Intimate partner violence:    Fear of current or ex partner: Not on file    Emotionally abused: Not on file    Physically abused: Not on file    Forced sexual activity: Not on file  Other Topics Concern  . Not on file  Social History Narrative   Daily Caffeine Use:  Yes            Review of systems: Review of Systems  Constitutional: Negative for fever and chills.  HENT: Negative.   Eyes: Negative for blurred vision.  Respiratory: Negative for cough, shortness of breath and wheezing.   Cardiovascular: Negative for chest pain and palpitations.  Gastrointestinal: as per HPI Genitourinary: Negative for dysuria, urgency, frequency and hematuria.  Musculoskeletal: Positive for myalgias, back pain and joint pain.  Skin: Negative for itching and rash.  Neurological: Negative for dizziness, tremors, focal weakness, seizures and loss of consciousness.  Endo/Heme/Allergies: Positive for seasonal allergies.  Psychiatric/Behavioral: Negative for depression, suicidal ideas and hallucinations.  All other systems reviewed and are negative.   Physical Exam: Vitals:   07/16/18 0927  BP: 120/74  Pulse: 70   Body mass index is 30.04 kg/m. Gen:      No acute distress HEENT:  EOMI, sclera anicteric Neck:     No masses; no thyromegaly Lungs:    Clear to auscultation  bilaterally; normal respiratory effort CV:         Regular rate and rhythm; no murmurs Abd:      + bowel sounds; soft, non-tender; no palpable masses, no distension Ext:    No edema; adequate peripheral perfusion Skin:      Warm and dry; no rash Neuro: alert and oriented x 3 Psych: normal mood and affect  Data Reviewed:  Reviewed labs, radiology imaging, old records and pertinent past GI work up   Assessment and Plan/Recommendations: 67 year old male with history of chronic GERD status post Nissen fundoplication complicated postop course with disrupted Nissen, leak and recurrent hiatal hernia Patient had worsening GERD symptoms and epigastric abdominal pain with Augmentin, has since improved after he started taking Dexilant and Carafate He has higher out-of-pocket expense with Dexilant. We will continue Protonix 40 mg daily, 30 minutes before breakfast and Zantac 300 mg at bedtime as needed Carafate before meals and at bedtime as needed Discussed antireflux measures in detail Return in 6 months or sooner if needed  25 minutes was spent face-to-face with the patient. Greater than 50% of the time used for counseling as well as treatment plan and follow-up.  He had multiple questions which were answered to his satisfaction  K. Denzil Magnuson , MD [phone]    CC: [name], MD

## 2018-07-16 NOTE — Patient Instructions (Signed)
Continue Protonix 40 mg daily before breakfast  Use Zantac 300 mg at bedtime as needed  We will send your Carafate refills to your pharmacy   Follow up in 6 months   If you are age 66 or older, your body mass index should be between 23-30. Your Body mass index is 30.04 kg/m. If this is out of the aforementioned range listed, please consider follow up with your Primary Care Provider.  If you are age 27 or younger, your body mass index should be between 19-25. Your Body mass index is 30.04 kg/m. If this is out of the aformentioned range listed, please consider follow up with your Primary Care Provider.    Thank you for choosing [facility]  Karleen Hampshire [facility],MD

## 2018-07-22 DIAGNOSIS — M65312 Trigger thumb, left thumb: Secondary | ICD-10-CM | POA: Diagnosis not present

## 2018-07-23 ENCOUNTER — Encounter: Payer: Self-pay | Admitting: Gastroenterology

## 2018-08-09 ENCOUNTER — Telehealth: Payer: Self-pay | Admitting: *Deleted

## 2018-08-09 ENCOUNTER — Encounter: Payer: Self-pay | Admitting: *Deleted

## 2018-08-09 NOTE — Telephone Encounter (Signed)
Done prior auth on Dexilant for patient through Cover My meds  Waiting on response

## 2018-08-16 DIAGNOSIS — Z23 Encounter for immunization: Secondary | ICD-10-CM | POA: Diagnosis not present

## 2018-08-16 NOTE — Telephone Encounter (Signed)
Per insurance they have on file patient takes both dexilant and protonix  I need to know which he is taking

## 2018-08-16 NOTE — Telephone Encounter (Signed)
Resubmitted information to insurance that patient is only taking Dexilant so we can get this approved hopefully today

## 2018-08-19 NOTE — Telephone Encounter (Signed)
Prior auth approved until 11/06/2019 Sent approval to be scanned in

## 2018-09-04 DIAGNOSIS — G500 Trigeminal neuralgia: Secondary | ICD-10-CM | POA: Diagnosis not present

## 2018-10-16 DIAGNOSIS — R972 Elevated prostate specific antigen [PSA]: Secondary | ICD-10-CM | POA: Diagnosis not present

## 2018-10-20 ENCOUNTER — Other Ambulatory Visit: Payer: Self-pay | Admitting: Gastroenterology

## 2018-10-20 DIAGNOSIS — K219 Gastro-esophageal reflux disease without esophagitis: Secondary | ICD-10-CM

## 2018-10-21 DIAGNOSIS — H524 Presbyopia: Secondary | ICD-10-CM | POA: Diagnosis not present

## 2018-10-21 DIAGNOSIS — H2513 Age-related nuclear cataract, bilateral: Secondary | ICD-10-CM | POA: Diagnosis not present

## 2018-11-12 ENCOUNTER — Other Ambulatory Visit: Payer: Self-pay | Admitting: Gastroenterology

## 2018-11-13 ENCOUNTER — Telehealth: Payer: Self-pay | Admitting: Internal Medicine

## 2018-11-13 DIAGNOSIS — R079 Chest pain, unspecified: Secondary | ICD-10-CM

## 2018-11-13 NOTE — Telephone Encounter (Signed)
Ok, this is done 

## 2018-11-26 DIAGNOSIS — Z6831 Body mass index (BMI) 31.0-31.9, adult: Secondary | ICD-10-CM | POA: Diagnosis not present

## 2018-11-26 DIAGNOSIS — I10 Essential (primary) hypertension: Secondary | ICD-10-CM | POA: Diagnosis not present

## 2018-11-26 DIAGNOSIS — G500 Trigeminal neuralgia: Secondary | ICD-10-CM | POA: Diagnosis not present

## 2018-11-29 DIAGNOSIS — G500 Trigeminal neuralgia: Secondary | ICD-10-CM | POA: Diagnosis not present

## 2018-12-07 ENCOUNTER — Other Ambulatory Visit: Payer: Self-pay | Admitting: Internal Medicine

## 2018-12-10 NOTE — Progress Notes (Signed)
Cardiology Office Note   Date:  12/17/2018   ID:  [name], DOB [birth year], MRN [id number]  PCP:  [name], MD  Cardiologist:   Jenkins Rouge, MD   No chief complaint on file.     History of Present Illness: [name] is a 68 y.o. male who presents for consultation regarding chest pain. Referred by Dr [name] CRF;s include HTN and HLD. He has chronic GERD post Nissen fundoplication and recurrent hiatal hernia Taking Dexilant and Carafate He gets frequent burning pain in epigastric area and chest as well as burping and nausea. Definitely GI Overtones to pain   Retired from [employer] has 3 kids. Brother had stents at his age No chest pain   Past Medical History:  Diagnosis Date  . Allergic rhinitis   . Anemia, pernicious   . Anxiety and depression    sees Dr. [name]  . Arthritis   . Catheter-associated urinary tract infection ([place])   . Diverticulosis of colon   . Elevated PSA    and hypogonadism, sees urologist  . Facial asymmetry   . Flatulence/gas pain/belching   . GERD (gastroesophageal reflux disease)    hiatal hernia  . Hiccups   . History of weight loss   . HTN (hypertension)   . Hx of blood transfusion reaction 1999  . Hyperlipidemia   . Hyperparathyroidism ([place])   . Hyperplastic colon polyp   . Hypertension    acei causes cough  . IBS (irritable bowel syndrome)   . Nephrolithiasis   . Obstructive chronic bronchitis without exacerbation, followed by Dr. [name] 03/04/2009   ONO RA  Normal 07/28/11 6 min walk >551m no desat PFTs 08/08/11: DLCO 70% TLC 80%  FeV1 80%   Fef 25 75 56% with significant improvement after BD   . Peritonitis ([place])    after surgery in 1999  . Pneumonia   . PONV (postoperative nausea and vomiting)   . Rectal fissure   . Trigeminal neuralgia    has facial asymetry  . Ventral hernia     Past Surgical History:  Procedure Laterality Date  . ABDOMINAL SURGERY     Multiple  . BRAIN SURGERY  01/2011   Trigeminal nerve/Dr Vertell Limber  . CHOLECYSTECTOMY  1981  . DENTAL SURGERY  2019   tooth extraction, root canal  . HAND SURGERY  1973   Left  . HERNIA REPAIR  1999  . KNEE SURGERY  1997   Left  . LITHOTRIPSY     Right  . NISSEN FUNDOPLICATION  6203    complicated by gastric perforation, peritonitis, ventral nernia   . RHIZOTOMY Right 11/17/2014   Procedure: RHIZOTOMY TO RIGHT V2,V3;  Surgeon: Erline Levine, MD;  Location: [facility];  Service: Neurosurgery;  Laterality: Right;  right  . RHIZOTOMY Right 12/22/2014   Procedure: Right V2 and V3 trigeminal rhysolysis;  Surgeon: Erline Levine, MD;  Location: [facility];  Service: Neurosurgery;  Laterality: Right;  Right V2 and V3 trigeminal rhysolysis  . RHIZOTOMY W/ RADIOFREQUENCY ABLATION  08/2017   Dr [name]  . UPPER GASTROINTESTINAL ENDOSCOPY    . VENTRAL HERNIA REPAIR  2004     Current Outpatient Medications  Medication Sig Dispense Refill  . ALPRAZolam (XANAX) 1 MG tablet Take 1 mg by mouth at bedtime as needed for anxiety.     . carbamazepine (TEGRETOL) 200 MG tablet Take 200 mg 2 (two) times daily by mouth.  2  . DEXILANT 60 MG  capsule TAKE 1 CAPSULE BY MOUTH DAILY 30 capsule 3  . gabapentin (NEURONTIN) 400 MG capsule Take 1,600 mg by mouth 3 (three) times daily.     Marland Kitchen losartan-hydrochlorothiazide (HYZAAR) 50-12.5 MG tablet TAKE 1 TABLET BY MOUTH EVERY DAY 90 tablet 1  . pantoprazole (PROTONIX) 40 MG tablet Take 1 tablet (40 mg total) by mouth daily. 30 minutes before breakfast 90 tablet 3  . polyethylene glycol powder (GLYCOLAX/MIRALAX) powder DISSOLVE 1 CAPFUL IN LIQUID EVERY DAY AS NEEDED FOR CONSTIPATION 527 g 2  . Probiotic Product (ALIGN) 4 MG CAPS Take by mouth daily.    . ranitidine (ZANTAC) 300 MG tablet Take 1 tablet (300 mg total) by mouth at bedtime. 90 tablet 3  . sucralfate (CARAFATE) 1 g tablet TAKE 1 TABLET BY MOUTH THREE TIMES DAILY 120 tablet 2   No current facility-administered medications for this visit.      Allergies:   Dilantin [phenytoin]; Dilaudid [hydromorphone hcl]; Ace inhibitors; Augmentin [amoxicillin-pot clavulanate]; Codeine; and Sulfa antibiotics    Social History:  The patient  reports that he quit smoking about 22 years ago. His smoking use included cigarettes. He has a 20.00 pack-year smoking history. He quit smokeless tobacco use about 35 years ago.  His smokeless tobacco use included chew. He reports that he does not drink alcohol or use drugs.   Family History:  The patient's family history includes Clotting disorder in his brother; Coronary artery disease in his mother; Diabetes in his brother, mother, and sister; Heart disease in his brother and brother; Hypertension in his brother.    ROS:  Please see the history of present illness.   Otherwise, review of systems are positive for none.   All other systems are reviewed and negative.    PHYSICAL EXAM: VS:  BP 122/82   Pulse 70   Ht 5\' 11"  (1.803 m)   Wt 225 lb (102.1 kg)   BMI 31.38 kg/m  , BMI Body mass index is 31.38 kg/m. Affect appropriate Healthy:  appears stated age 85: normal Neck supple with no adenopathy JVP normal no bruits no thyromegaly Lungs clear with no wheezing and good diaphragmatic motion Heart:  S1/S2 no murmur, no rub, gallop or click PMI normal Abdomen: benighn, BS positve, no tenderness, no AAA no bruit.  No HSM or HJR Distal pulses intact with no bruits No edema Neuro non-focal Skin warm and dry No muscular weakness    EKG:  09/13/17  SR rate 85 ICRBBB  12/17/18 SR rate 70 normal ECG    Recent Labs: 01/24/2018: ALT 19; BUN 10; Creatinine, Ser 0.91; Hemoglobin 16.6; Platelets 155.0; Potassium 4.5; Sodium 137; TSH 1.76    Lipid Panel    Component Value Date/Time   CHOL 161 01/24/2018 1117   TRIG 111.0 01/24/2018 1117   HDL 53.90 01/24/2018 1117   CHOLHDL 3 01/24/2018 1117   VLDL 22.2 01/24/2018 1117   LDLCALC 85 01/24/2018 1117      Wt Readings from Last 3 Encounters:   12/17/18 225 lb (102.1 kg)  07/16/18 215 lb 6 oz (97.7 kg)  01/24/18 217 lb (98.4 kg)      Other studies Reviewed: Additional studies/ records that were reviewed today include: Notes from primary Notes from GI , ECG.    ASSESSMENT AND PLAN:  1.  Chest Pain:  Atypical no need for ETT normal ECG Family history CAD will order coronary calcium score  2.  GERD:  Chronic complex problem f/u [facility] continue Dexilant and Carafate  3.  HTN:  Well controlled.  Continue current medications and low sodium Dash type diet.   4.  HLD:  LDL 85 continue diet Rx  Unless calcium score very high    Current medicines are reviewed at length with the patient today.  The patient does not have concerns regarding medicines.  The following changes have been made:  no change  Labs/ tests ordered today include: calcium score   Orders Placed This Encounter  Procedures  . CT CARDIAC SCORING  . EKG 12-Lead     Disposition:   FU with cardiology PRN      Signed, Jenkins Rouge, MD  12/17/2018 8:55 AM    [facility] [address], [place], [place]  [address] Phone: [phone]; Fax: [phone]

## 2018-12-17 ENCOUNTER — Ambulatory Visit (INDEPENDENT_AMBULATORY_CARE_PROVIDER_SITE_OTHER): Payer: Medicare Other | Admitting: Cardiovascular Disease

## 2018-12-17 VITALS — BP 122/82 | HR 70 | Ht 71.0 in | Wt 225.0 lb

## 2018-12-17 DIAGNOSIS — E785 Hyperlipidemia, unspecified: Secondary | ICD-10-CM

## 2018-12-17 DIAGNOSIS — R079 Chest pain, unspecified: Secondary | ICD-10-CM

## 2018-12-17 DIAGNOSIS — I10 Essential (primary) hypertension: Secondary | ICD-10-CM

## 2018-12-17 NOTE — Patient Instructions (Addendum)
Medication Instructions:   If you need a refill on your cardiac medications before your next appointment, please call your pharmacy.   Lab work:  If you have labs (blood work) drawn today and your tests are completely normal, you will receive your results only by: Marland Kitchen MyChart Message (if you have MyChart) OR . A paper copy in the mail If you have any lab test that is abnormal or we need to change your treatment, we will call you to review the results.  Testing/Procedures: Cardiac CT scanning for calcium score, (CAT scanning), is a noninvasive, special x-ray that produces cross-sectional images of the body using x-rays and a computer. CT scans help physicians diagnose and treat medical conditions. For some CT exams, a contrast material is used to enhance visibility in the area of the body being studied. CT scans provide greater clarity and reveal more details than regular x-ray exams.  Follow-Up: At [facility], you and your health needs are our priority.  As part of our continuing mission to provide you with exceptional heart care, we have created designated Provider Care Teams.  These Care Teams include your primary Cardiologist (physician) and Advanced Practice Providers (APPs -  Physician Assistants and Nurse Practitioners) who all work together to provide you with the care you need, when you need it. You will need a follow up appointment in 12 months.  Please call our office 2 months in advance to schedule this appointment.  You may see Dr. Johnsie Cancel or one of the following Advanced Practice Providers on your designated Care Team:   Truitt Merle, NP Cecilie Kicks, NP . Kathyrn Drown, NP

## 2018-12-23 DIAGNOSIS — Z6831 Body mass index (BMI) 31.0-31.9, adult: Secondary | ICD-10-CM | POA: Diagnosis not present

## 2018-12-23 DIAGNOSIS — G500 Trigeminal neuralgia: Secondary | ICD-10-CM | POA: Diagnosis not present

## 2018-12-23 DIAGNOSIS — I10 Essential (primary) hypertension: Secondary | ICD-10-CM | POA: Diagnosis not present

## 2019-01-02 ENCOUNTER — Ambulatory Visit (INDEPENDENT_AMBULATORY_CARE_PROVIDER_SITE_OTHER)
Admission: RE | Admit: 2019-01-02 | Discharge: 2019-01-02 | Disposition: A | Discharge status: Home / Self Care | Payer: Self-pay | Source: Ambulatory Visit | Attending: Cardiovascular Disease | Admitting: Cardiovascular Disease

## 2019-01-02 ENCOUNTER — Ambulatory Visit (INDEPENDENT_AMBULATORY_CARE_PROVIDER_SITE_OTHER): Payer: Medicare Other | Admitting: Gastroenterology

## 2019-01-02 ENCOUNTER — Encounter: Payer: Self-pay | Admitting: Gastroenterology

## 2019-01-02 VITALS — BP 148/88 | HR 68 | Ht 71.0 in | Wt 227.8 lb

## 2019-01-02 DIAGNOSIS — I10 Essential (primary) hypertension: Secondary | ICD-10-CM

## 2019-01-02 DIAGNOSIS — R142 Eructation: Secondary | ICD-10-CM | POA: Diagnosis not present

## 2019-01-02 DIAGNOSIS — R079 Chest pain, unspecified: Secondary | ICD-10-CM

## 2019-01-02 DIAGNOSIS — E785 Hyperlipidemia, unspecified: Secondary | ICD-10-CM

## 2019-01-02 DIAGNOSIS — K219 Gastro-esophageal reflux disease without esophagitis: Secondary | ICD-10-CM | POA: Diagnosis not present

## 2019-01-02 MED ORDER — PANTOPRAZOLE SODIUM 40 MG PO TBEC: 40.0000 mg | DELAYED_RELEASE_TABLET | Freq: Every day | ORAL | 3 refills | Status: DC

## 2019-01-02 NOTE — Patient Instructions (Addendum)
Continue Protonix and Zantac as needed  We have given you Dexilant samples to take as needed   Purchase Simethicone liquid and take every 8 hours as needed  Follow up in 4 months  If you are age 67 or older, your body mass index should be between 23-30. Your Body mass index is 31.77 kg/m. If this is out of the aforementioned range listed, please consider follow up with your Primary Care Provider.  If you are age 8 or younger, your body mass index should be between 19-25. Your Body mass index is 31.77 kg/m. If this is out of the aformentioned range listed, please consider follow up with your Primary Care Provider.    I appreciate the  opportunity to care for you  Thank You   Harl Bowie , MD

## 2019-01-02 NOTE — Progress Notes (Signed)
[name]    [id number]    10-Jul-1951  Primary Care Physician:[name], [name], MD  Referring Physician: [name], MD [address], [place] [address]  Chief complaint: GERD, belching HPI: 68 year old male with history of chronic GERD status post Nissen fundoplication complicated by postoperative leak, recurrent hernia here for follow-up visit He is currently taking Protonix 40 mg twice daily, also takes additional Dexilant 60 mg daily when he has severe breakthrough heartburn or epigastric abdominal discomfort.  His main complaints is frequent belching/burping. He is on high-dose gabapentin and also on Tegretol for trigeminal neuralgia, status post ablation Is any dysphagia, nausea, odynophagia, vomiting, change in bowel habits, melena or blood per rectum.  No loss of appetite or weight loss.  EGD December 07, 2017 with a 5 cm hiatal hernia and mild gastritis otherwise unremarkable exam  Colonoscopy January 2017 with removal of multiple adenomatous polyps X3 and one tubulovillous adenoma with the largest polyp >2 cm Colonoscopy April 2018 with removal of 2 small diminutive tubular adenomas and 1 hyperplastic polyp  Outpatient Encounter Medications as of 01/02/2019  Medication Sig  . ALPRAZolam (XANAX) 1 MG tablet Take 1 mg by mouth at bedtime as needed for anxiety.   . carbamazepine (TEGRETOL) 200 MG tablet Take 200 mg 2 (two) times daily by mouth.  . DEXILANT 60 MG capsule TAKE 1 CAPSULE BY MOUTH DAILY  . gabapentin (NEURONTIN) 400 MG capsule Take 1 tablet by mouth every morning, 1 tablet by mouth every evening, 2 tablets by mouth at night  . losartan-hydrochlorothiazide (HYZAAR) 50-12.5 MG tablet TAKE 1 TABLET BY MOUTH EVERY DAY  . pantoprazole (PROTONIX) 40 MG tablet Take 1 tablet (40 mg total) by mouth daily. 30 minutes before breakfast  . polyethylene glycol powder (GLYCOLAX/MIRALAX) powder DISSOLVE 1 CAPFUL IN LIQUID EVERY DAY AS NEEDED FOR  CONSTIPATION  . Probiotic Product (ALIGN) 4 MG CAPS Take by mouth daily.  . ranitidine (ZANTAC) 300 MG tablet Take 1 tablet (300 mg total) by mouth at bedtime. (Patient taking differently: Take 300 mg by mouth as needed. )  . sucralfate (CARAFATE) 1 g tablet TAKE 1 TABLET BY MOUTH THREE TIMES DAILY  . [DISCONTINUED] losartan (COZAAR) 50 MG tablet Take 1 tablet (50 mg total) by mouth daily.   No facility-administered encounter medications on file as of 01/02/2019.     Allergies as of 01/02/2019 - Review Complete 01/02/2019  Allergen Reaction Noted  . Dilantin [phenytoin]  07/27/2017  . Dilaudid [hydromorphone hcl]  09/12/2017  . Ace inhibitors Cough 04/02/2008  . Augmentin [amoxicillin-pot clavulanate] Nausea And Vomiting 07/06/2017  . Codeine Nausea And Vomiting 11/10/2014  . Sulfa antibiotics  12/17/2014    Past Medical History:  Diagnosis Date  . Allergic rhinitis   . Anemia, pernicious   . Anxiety and depression    sees Dr. [name]  . Arthritis   . Catheter-associated urinary tract infection ([place])   . Diverticulosis of colon   . Elevated PSA    and hypogonadism, sees urologist  . Facial asymmetry   . Flatulence/gas pain/belching   . GERD (gastroesophageal reflux disease)    hiatal hernia  . Hiccups   . History of weight loss   . HTN (hypertension)   . Hx of blood transfusion reaction 1999  . Hyperlipidemia   . Hyperparathyroidism ([place])   . Hyperplastic colon polyp   . Hypertension    acei causes cough  .  IBS (irritable bowel syndrome)   . Nephrolithiasis   . Obstructive chronic bronchitis without exacerbation, followed by Dr. [name] 03/04/2009   ONO RA  Normal 07/28/11 6 min walk >573m no desat PFTs 08/08/11: DLCO 70% TLC 80%  FeV1 80%   Fef 25 75 56% with significant improvement after BD   . Peritonitis ([place])    after surgery in 1999  . Pneumonia   . PONV (postoperative nausea and vomiting)   . Rectal fissure   . Trigeminal neuralgia    has facial asymetry  .  Ventral hernia     Past Surgical History:  Procedure Laterality Date  . ABDOMINAL SURGERY     Multiple  . BRAIN SURGERY  01/2011   Trigeminal nerve/Dr Vertell Limber  . CHOLECYSTECTOMY  1981  . DENTAL SURGERY  2019   tooth extraction, root canal  . HAND SURGERY  1973   Left  . HERNIA REPAIR  1999  . KNEE SURGERY  1997   Left  . LITHOTRIPSY     Right  . NISSEN FUNDOPLICATION  2426    complicated by gastric perforation, peritonitis, ventral nernia   . RHIZOTOMY Right 11/17/2014   Procedure: RHIZOTOMY TO RIGHT V2,V3;  Surgeon: Erline Levine, MD;  Location: [facility];  Service: Neurosurgery;  Laterality: Right;  right  . RHIZOTOMY Right 12/22/2014   Procedure: Right V2 and V3 trigeminal rhysolysis;  Surgeon: Erline Levine, MD;  Location: [facility];  Service: Neurosurgery;  Laterality: Right;  Right V2 and V3 trigeminal rhysolysis  . RHIZOTOMY W/ RADIOFREQUENCY ABLATION  08/2017   Dr [name]  . UPPER GASTROINTESTINAL ENDOSCOPY    . VENTRAL HERNIA REPAIR  2004    Family History  Problem Relation Age of Onset  . Diabetes Mother   . Coronary artery disease Mother        CABG  . Diabetes Sister   . Diabetes Brother   . Hypertension Brother   . Heart disease Brother   . Heart disease Brother   . Colon cancer Neg Hx   . Esophageal cancer Neg Hx   . Stomach cancer Neg Hx     Social History   Socioeconomic History  . Marital status: Married    Spouse name: Not on file  . Number of children: 2  . Years of education: Not on file  . Highest education level: Not on file  Occupational History  . Occupation: Disabled - psych  Social Needs  . Financial resource strain: Not on file  . Food insecurity:    Worry: Not on file    Inability: Not on file  . Transportation needs:    Medical: Not on file    Non-medical: Not on file  Tobacco Use  . Smoking status: Former Smoker    Packs/day: 1.00    Years: 20.00    Pack years: 20.00    Types: Cigarettes    Last attempt to quit:  11/06/1996    Years since quitting: 22.1  . Smokeless tobacco: Former Systems developer    Types: [place] date: 11/07/1983  Substance and Sexual Activity  . Alcohol use: No  . Drug use: No  . Sexual activity: Yes    Partners: Female  Lifestyle  . Physical activity:    Days per week: Not on file    Minutes per session: Not on file  . Stress: Not on file  Relationships  . Social connections:    Talks on phone: Not on file  Gets together: Not on file    Attends religious service: Not on file    Active member of club or organization: Not on file    Attends meetings of clubs or organizations: Not on file    Relationship status: Not on file  . Intimate partner violence:    Fear of current or ex partner: Not on file    Emotionally abused: Not on file    Physically abused: Not on file    Forced sexual activity: Not on file  Other Topics Concern  . Not on file  Social History Narrative   Daily Caffeine Use:  Yes            Review of systems: Review of Systems  Constitutional: Negative for fever and chills.  HENT: Negative.   Eyes: Negative for blurred vision.  Respiratory: Negative for cough, shortness of breath and wheezing.   Cardiovascular: Negative for chest pain and palpitations.  Gastrointestinal: as per HPI Genitourinary: Negative for dysuria, urgency, frequency and hematuria.  Musculoskeletal: Negative for myalgias, back pain and joint pain.  Skin: Negative for itching and rash.  Neurological: Negative for dizziness, tremors, focal weakness, seizures and loss of consciousness.  Endo/Heme/Allergies: Positive for seasonal allergies.  Psychiatric/Behavioral: Negative for depression, suicidal ideas and hallucinations.  All other systems reviewed and are negative.   Physical Exam: Vitals:   01/02/19 0938  BP: (!) 148/88  Pulse: 68   Body mass index is 31.77 kg/m. Gen:      No acute distress HEENT:  EOMI, sclera anicteric Neck:     No masses; no thyromegaly Lungs:     Clear to auscultation bilaterally; normal respiratory effort CV:         Regular rate and rhythm; no murmurs Abd:      + bowel sounds; soft, non-tender; no palpable masses, no distension Ext:    No edema; adequate peripheral perfusion Skin:      Warm and dry; no rash Neuro: alert and oriented x 3 Psych: normal mood and affect  Data Reviewed:  Reviewed labs, radiology imaging, old records and pertinent past GI work up   Assessment and Plan/Recommendations:  68 year old male with history of chronic GERD status post Nissen fundoplication complicated by postop leak, with recurrent hiatal hernia here with complaints of persistent breakthrough GERD symptoms and frequent belching Aerophagia likely worsening symptoms, suggested different techniques to prevent it Continue Protonix 40 mg twice daily, Zantac 150 milligrams at bedtime as needed Dexilant is not covered by his insurance, provided some samples to take as needed for severe breakthrough symptoms Okay to use simethicone liquid  as needed up to 3 times daily to prevent excessive belching Done in 3 to 4 months or sooner if needed  25 minutes was spent face-to-face with the patient. Greater than 50% of the time used for counseling as well as treatment plan and follow-up. He had multiple questions which were answered to his satisfaction  K. Denzil Magnuson , MD [phone]    CC: [name], MD

## 2019-01-23 ENCOUNTER — Telehealth: Payer: Self-pay | Admitting: Gastroenterology

## 2019-01-23 NOTE — Telephone Encounter (Signed)
Pt wife called advised that her husband is still having the burping and would like to know what else he can do.

## 2019-01-24 NOTE — Telephone Encounter (Signed)
Reviewed the suggestions from the recent office visit. He is better today. He had a frustrating day yesterday. They will call us if the symptoms acutely worsen. Declines any appointment due to the [place] [facility] issue.

## 2019-01-28 ENCOUNTER — Ambulatory Visit: Payer: Medicare Other | Admitting: Internal Medicine

## 2019-03-25 DIAGNOSIS — G500 Trigeminal neuralgia: Secondary | ICD-10-CM | POA: Diagnosis not present

## 2019-04-14 DIAGNOSIS — R972 Elevated prostate specific antigen [PSA]: Secondary | ICD-10-CM | POA: Diagnosis not present

## 2019-04-21 DIAGNOSIS — R972 Elevated prostate specific antigen [PSA]: Secondary | ICD-10-CM | POA: Diagnosis not present

## 2019-04-21 DIAGNOSIS — N400 Enlarged prostate without lower urinary tract symptoms: Secondary | ICD-10-CM | POA: Diagnosis not present

## 2019-05-28 ENCOUNTER — Ambulatory Visit (INDEPENDENT_AMBULATORY_CARE_PROVIDER_SITE_OTHER): Payer: Medicare Other | Admitting: Internal Medicine

## 2019-05-28 ENCOUNTER — Encounter: Payer: Self-pay | Admitting: Internal Medicine

## 2019-05-28 DIAGNOSIS — J449 Chronic obstructive pulmonary disease, unspecified: Secondary | ICD-10-CM

## 2019-05-28 DIAGNOSIS — R739 Hyperglycemia, unspecified: Secondary | ICD-10-CM | POA: Diagnosis not present

## 2019-05-28 DIAGNOSIS — N320 Bladder-neck obstruction: Secondary | ICD-10-CM | POA: Diagnosis not present

## 2019-05-28 DIAGNOSIS — I10 Essential (primary) hypertension: Secondary | ICD-10-CM

## 2019-05-28 MED ORDER — LOSARTAN POTASSIUM-HCTZ 50-12.5 MG PO TABS: 1.0000 | ORAL_TABLET | Freq: Every day | ORAL | 3 refills | Status: DC

## 2019-05-28 NOTE — Assessment & Plan Note (Signed)
stable overall by history and exam, recent data reviewed with pt, and pt to continue medical treatment as before,  to f/u any worsening symptoms or concerns  

## 2019-05-28 NOTE — Assessment & Plan Note (Signed)
stable overall by history and exam, recent data reviewed with pt, and pt to continue medical treatment as before,  to f/u any worsening symptoms or concerns, for a1c with labs 

## 2019-05-28 NOTE — Patient Instructions (Signed)
Please continue all other medications as before, and refills have been done if requested.  Please have the pharmacy call with any other refills you may need.  Please continue your efforts at being more active, low cholesterol diet, and weight control.  You are otherwise up to date with prevention measures today.  Please keep your appointments with your specialists as you may have planned  Please go to the LAB in the Basement (turn left off the elevator) for the tests to be done   You will be contacted by phone if any changes need to be made immediately.  Otherwise, you will receive a letter about your results with an explanation, but please check with MyChart first.  Please remember to sign up for MyChart if you have not done so, as this will be important to you in the future with finding out test results, communicating by private email, and scheduling acute appointments online when needed.  Please return in 1 year for your yearly visit, or sooner if needed 

## 2019-05-28 NOTE — Progress Notes (Signed)
Patient ID: [name], male   DOB: 1951/08/13, 68 y.o.   MRN: [id number]  Virtual Visit via Video Note  I connected with [name] on 05/28/19 at  4:00 PM EDT by a video enabled telemedicine application and verified that I am speaking with the correct person using two identifiers.  Location: Patient: at home Provider: at office   I discussed the limitations of evaluation and management by telemedicine and the availability of in person appointments. The patient expressed understanding and agreed to proceed.  History of Present Illness: Here for yearly f/u;  Overall doing ok;  Pt denies Chest pain, worsening SOB, DOE, wheezing, orthopnea, PND, worsening LE edema, palpitations, dizziness or syncope.  Pt denies neurological change such as new headache, facial or extremity weakness.  Pt denies polydipsia, polyuria, or low sugar symptoms. Pt states overall good compliance with treatment and medications, good tolerability, and has been trying to follow appropriate diet.  Pt denies worsening depressive symptoms, suicidal ideation or panic. No fever, night sweats, wt loss, loss of appetite, or other constitutional symptoms.  Pt states good ability with ADL's, has low fall risk, home safety reviewed and adequate, no other significant changes in hearing or vision, and only occasionally active with exercise  BP at home < 140/90 Past Medical History:  Diagnosis Date  . Allergic rhinitis   . Anemia, pernicious   . Anxiety and depression    sees [name]  . Arthritis   . Catheter-associated urinary tract infection ([name])   . Diverticulosis of colon   . Elevated PSA    and hypogonadism, sees urologist  . Facial asymmetry   . Flatulence/gas pain/belching   . GERD (gastroesophageal reflux disease)    hiatal hernia  . Hiccups   . History of weight loss   . HTN (hypertension)   . Hx of blood transfusion reaction 1999  . Hyperlipidemia   . Hyperparathyroidism ([place])   . Hyperplastic  colon polyp   . Hypertension    acei causes cough  . IBS (irritable bowel syndrome)   . Nephrolithiasis   . Obstructive chronic bronchitis without exacerbation, followed by Jeffrey. [name] 03/04/2009   ONO RA  Normal 07/28/11 6 min walk >516m no desat PFTs 08/08/11: DLCO 70% TLC 80%  FeV1 80%   Fef 25 75 56% with significant improvement after BD   . Peritonitis ([name])    after surgery in 1999  . Pneumonia   . PONV (postoperative nausea and vomiting)   . Rectal fissure   . Trigeminal neuralgia    has facial asymetry  . Ventral hernia    Past Surgical History:  Procedure Laterality Date  . ABDOMINAL SURGERY     Multiple  . BRAIN SURGERY  01/2011   Trigeminal nerve/[name]  . CHOLECYSTECTOMY  1981  . DENTAL SURGERY  2019   tooth extraction, root canal  . HAND SURGERY  1973   Left  . HERNIA REPAIR  1999  . KNEE SURGERY  1997   Left  . LITHOTRIPSY     Right  . NISSEN FUNDOPLICATION  4970    complicated by gastric perforation, peritonitis, ventral nernia   . RHIZOTOMY Right 11/17/2014   Procedure: RHIZOTOMY TO RIGHT V2,V3;  Surgeon: Jeffrey Levine, MD;  Location: [facility];  Service: Neurosurgery;  Laterality: Right;  right  . RHIZOTOMY Right 12/22/2014   Procedure: Right V2 and V3 trigeminal rhysolysis;  Surgeon: Jeffrey Levine, MD;  Location: [facility];  Service: Neurosurgery;  Laterality: Right;  Right V2 and V3 trigeminal rhysolysis  . RHIZOTOMY W/ RADIOFREQUENCY ABLATION  08/2017   Jeffrey [name]  . UPPER GASTROINTESTINAL ENDOSCOPY    . VENTRAL HERNIA REPAIR  2004    reports that he quit smoking about 22 years ago. His smoking use included cigarettes. He has a 20.00 pack-year smoking history. He quit smokeless tobacco use about 35 years ago.  His smokeless tobacco use included chew. He reports that he does not drink alcohol or use drugs. family history includes Coronary artery disease in his mother; Diabetes in his brother, mother, and sister; Heart disease in his brother and brother;  Hypertension in his brother. Allergies  Allergen Reactions  . Dilantin [Phenytoin]     bradycardia  . Dilaudid [Hydromorphone Hcl]   . Ace Inhibitors Cough  . Augmentin [Amoxicillin-Pot Clavulanate] Nausea And Vomiting  . Codeine Nausea And Vomiting  . Sulfa Antibiotics     hives   Current Outpatient Medications on File Prior to Visit  Medication Sig Dispense Refill  . ALPRAZolam (XANAX) 1 MG tablet Take 1 mg by mouth at bedtime as needed for anxiety.     . carbamazepine (TEGRETOL) 200 MG tablet Take 200 mg 2 (two) times daily by mouth.  2  . DEXILANT 60 MG capsule TAKE 1 CAPSULE BY MOUTH DAILY 30 capsule 3  . gabapentin (NEURONTIN) 400 MG capsule Take 1 tablet by mouth every morning, 1 tablet by mouth every evening, 2 tablets by mouth at night    . pantoprazole (PROTONIX) 40 MG tablet Take 1 tablet (40 mg total) by mouth daily. 30 minutes before breakfast 90 tablet 3  . polyethylene glycol powder (GLYCOLAX/MIRALAX) powder DISSOLVE 1 CAPFUL IN LIQUID EVERY DAY AS NEEDED FOR CONSTIPATION 527 g 2  . Probiotic Product (ALIGN) 4 MG CAPS Take by mouth daily.    . ranitidine (ZANTAC) 300 MG tablet Take 1 tablet (300 mg total) by mouth at bedtime. (Patient taking differently: Take 300 mg by mouth as needed. ) 90 tablet 3  . sucralfate (CARAFATE) 1 g tablet TAKE 1 TABLET BY MOUTH THREE TIMES DAILY 120 tablet 2   No current facility-administered medications on file prior to visit.     Observations/Objective: Alert, NAD, appropriate mood and affect, resps normal, cn 2-12 intact, moves all 4s, no visible rash or swelling Lab Results  Component Value Date   WBC 6.3 01/24/2018   HGB 16.6 01/24/2018   HCT 47.4 01/24/2018   PLT 155.0 01/24/2018   GLUCOSE 107 (H) 01/24/2018   CHOL 161 01/24/2018   TRIG 111.0 01/24/2018   HDL 53.90 01/24/2018   LDLCALC 85 01/24/2018   ALT 19 01/24/2018   AST 18 01/24/2018   NA 137 01/24/2018   K 4.5 01/24/2018   CL 97 01/24/2018   CREATININE 0.91  01/24/2018   BUN 10 01/24/2018   CO2 30 01/24/2018   TSH 1.76 01/24/2018   PSA 9.58 (H) 01/24/2018   HGBA1C 5.4 01/24/2018   Assessment and Plan: See notes  Follow Up Instructions: seenotes   I discussed the assessment and treatment plan with the patient. The patient was provided an opportunity to ask questions and all were answered. The patient agreed with the plan and demonstrated an understanding of the instructions.   The patient was advised to call back or seek an in-person evaluation if the symptoms worsen or if the condition fails to improve as anticipated.   Cathlean Cower, MD

## 2019-05-28 NOTE — Progress Notes (Signed)
   Subjective:    Patient ID: [name], male    DOB: 10-Jul-1951, 69 y.o.   MRN: [id number]  HPI    BP at home < 140/90  Review of Systems     Objective:   Physical Exam    Lab Results  Component Value Date   WBC 6.3 01/24/2018   HGB 16.6 01/24/2018   HCT 47.4 01/24/2018   PLT 155.0 01/24/2018   GLUCOSE 107 (H) 01/24/2018   CHOL 161 01/24/2018   TRIG 111.0 01/24/2018   HDL 53.90 01/24/2018   LDLCALC 85 01/24/2018   ALT 19 01/24/2018   AST 18 01/24/2018   NA 137 01/24/2018   K 4.5 01/24/2018   CL 97 01/24/2018   CREATININE 0.91 01/24/2018   BUN 10 01/24/2018   CO2 30 01/24/2018   TSH 1.76 01/24/2018   PSA 9.58 (H) 01/24/2018   HGBA1C 5.4 01/24/2018   Declines further lab today or soon, due to pandemic    Assessment & Plan:

## 2019-06-02 ENCOUNTER — Other Ambulatory Visit (INDEPENDENT_AMBULATORY_CARE_PROVIDER_SITE_OTHER): Payer: Medicare Other

## 2019-06-02 DIAGNOSIS — I10 Essential (primary) hypertension: Secondary | ICD-10-CM | POA: Diagnosis not present

## 2019-06-02 DIAGNOSIS — R739 Hyperglycemia, unspecified: Secondary | ICD-10-CM

## 2019-06-02 DIAGNOSIS — N320 Bladder-neck obstruction: Secondary | ICD-10-CM | POA: Diagnosis not present

## 2019-06-02 LAB — HEPATIC FUNCTION PANEL
ALT: 10 U/L (ref 0–53)
AST: 13 U/L (ref 0–37)
Albumin: 4.1 g/dL (ref 3.5–5.2)
Alkaline Phosphatase: 72 U/L (ref 39–117)
Bilirubin, Direct: 0.1 mg/dL (ref 0.0–0.3)
Total Bilirubin: 0.6 mg/dL (ref 0.2–1.2)
Total Protein: 6.6 g/dL (ref 6.0–8.3)

## 2019-06-02 LAB — PSA: PSA: 8.12 ng/mL — ABNORMAL HIGH (ref 0.10–4.00)

## 2019-06-02 LAB — CBC WITH DIFFERENTIAL/PLATELET
Basophils Absolute: 0 10*3/uL (ref 0.0–0.1)
Basophils Relative: 0.7 % (ref 0.0–3.0)
Eosinophils Absolute: 0.1 10*3/uL (ref 0.0–0.7)
Eosinophils Relative: 1 % (ref 0.0–5.0)
HCT: 45.7 % (ref 39.0–52.0)
Hemoglobin: 15.7 g/dL (ref 13.0–17.0)
Lymphocytes Relative: 29.9 % (ref 12.0–46.0)
Lymphs Abs: 2.2 10*3/uL (ref 0.7–4.0)
MCHC: 34.4 g/dL (ref 30.0–36.0)
MCV: 91.2 fl (ref 78.0–100.0)
Monocytes Absolute: 0.6 10*3/uL (ref 0.1–1.0)
Monocytes Relative: 7.8 % (ref 3.0–12.0)
Neutro Abs: 4.4 10*3/uL (ref 1.4–7.7)
Neutrophils Relative %: 60.6 % (ref 43.0–77.0)
Platelets: 156 10*3/uL (ref 150.0–400.0)
RBC: 5.02 Mil/uL (ref 4.22–5.81)
RDW: 13.4 % (ref 11.5–15.5)
WBC: 7.2 10*3/uL (ref 4.0–10.5)

## 2019-06-02 LAB — URINALYSIS, ROUTINE W REFLEX MICROSCOPIC
Bilirubin Urine: NEGATIVE
Hgb urine dipstick: NEGATIVE
Ketones, ur: NEGATIVE
Nitrite: NEGATIVE
Specific Gravity, Urine: 1.015 (ref 1.000–1.030)
Total Protein, Urine: NEGATIVE
Urine Glucose: NEGATIVE
Urobilinogen, UA: 0.2 (ref 0.0–1.0)
pH: 7 (ref 5.0–8.0)

## 2019-06-02 LAB — BASIC METABOLIC PANEL
BUN: 9 mg/dL (ref 6–23)
CO2: 29 mEq/L (ref 19–32)
Calcium: 9 mg/dL (ref 8.4–10.5)
Chloride: 98 mEq/L (ref 96–112)
Creatinine, Ser: 0.81 mg/dL (ref 0.40–1.50)
GFR: 95.06 mL/min (ref >60.00)
Glucose, Bld: 90 mg/dL (ref 70–99)
Potassium: 4 mEq/L (ref 3.5–5.1)
Sodium: 134 mEq/L — ABNORMAL LOW (ref 135–145)

## 2019-06-02 LAB — LIPID PANEL
Cholesterol: 155 mg/dL (ref 0–200)
HDL: 52.9 mg/dL (ref >39.00)
LDL Cholesterol: 75 mg/dL (ref 0–99)
NonHDL: 101.83
Total CHOL/HDL Ratio: 3
Triglycerides: 134 mg/dL (ref 0.0–149.0)
VLDL: 26.8 mg/dL (ref 0.0–40.0)

## 2019-06-02 LAB — TSH: TSH: 2.74 u[IU]/mL (ref 0.35–4.50)

## 2019-06-02 LAB — HEMOGLOBIN A1C: Hgb A1c MFr Bld: 5.4 % (ref 4.6–6.5)

## 2019-06-23 DIAGNOSIS — G500 Trigeminal neuralgia: Secondary | ICD-10-CM | POA: Diagnosis not present

## 2019-09-01 DIAGNOSIS — Z23 Encounter for immunization: Secondary | ICD-10-CM | POA: Diagnosis not present

## 2019-10-09 DIAGNOSIS — M1811 Unilateral primary osteoarthritis of first carpometacarpal joint, right hand: Secondary | ICD-10-CM | POA: Diagnosis not present

## 2019-10-09 DIAGNOSIS — M189 Osteoarthritis of first carpometacarpal joint, unspecified: Secondary | ICD-10-CM | POA: Diagnosis not present

## 2019-10-09 DIAGNOSIS — M79644 Pain in right finger(s): Secondary | ICD-10-CM | POA: Diagnosis not present

## 2019-10-15 ENCOUNTER — Ambulatory Visit: Payer: Medicare Other | Admitting: Physician Assistant

## 2019-10-20 DIAGNOSIS — R972 Elevated prostate specific antigen [PSA]: Secondary | ICD-10-CM | POA: Diagnosis not present

## 2019-12-15 ENCOUNTER — Telehealth: Payer: Self-pay | Admitting: Gastroenterology

## 2019-12-15 NOTE — Telephone Encounter (Signed)
We do not have any dexilant samples right now called and l/m for patient to inform to call back in a week to check back

## 2019-12-16 ENCOUNTER — Ambulatory Visit: Payer: Medicare Other | Attending: Internal Medicine

## 2019-12-16 ENCOUNTER — Other Ambulatory Visit: Payer: Self-pay

## 2019-12-16 DIAGNOSIS — Z6831 Body mass index (BMI) 31.0-31.9, adult: Secondary | ICD-10-CM | POA: Diagnosis not present

## 2019-12-16 DIAGNOSIS — I10 Essential (primary) hypertension: Secondary | ICD-10-CM | POA: Insufficient documentation

## 2019-12-16 DIAGNOSIS — Z20822 Contact with and (suspected) exposure to covid-19: Secondary | ICD-10-CM | POA: Diagnosis not present

## 2019-12-17 LAB — NOVEL CORONAVIRUS, NAA: SARS-CoV-2, NAA: NOT DETECTED

## 2019-12-20 ENCOUNTER — Encounter (HOSPITAL_COMMUNITY): Payer: Self-pay | Admitting: Emergency Medicine

## 2019-12-20 ENCOUNTER — Inpatient Hospital Stay (HOSPITAL_COMMUNITY): Payer: Medicare Other

## 2019-12-20 ENCOUNTER — Emergency Department (HOSPITAL_COMMUNITY): Payer: Medicare Other

## 2019-12-20 ENCOUNTER — Inpatient Hospital Stay (HOSPITAL_COMMUNITY)
Admission: EM | Admit: 2019-12-20 | Discharge: 2019-12-27 | DRG: 177 | Disposition: A | Discharge status: Home / Self Care | Payer: Medicare Other | Attending: Physician Assistant | Admitting: Physician Assistant

## 2019-12-20 ENCOUNTER — Other Ambulatory Visit: Payer: Self-pay

## 2019-12-20 DIAGNOSIS — K56609 Unspecified intestinal obstruction, unspecified as to partial versus complete obstruction: Secondary | ICD-10-CM

## 2019-12-20 DIAGNOSIS — F329 Major depressive disorder, single episode, unspecified: Secondary | ICD-10-CM | POA: Diagnosis present

## 2019-12-20 DIAGNOSIS — I10 Essential (primary) hypertension: Secondary | ICD-10-CM | POA: Diagnosis present

## 2019-12-20 DIAGNOSIS — E876 Hypokalemia: Secondary | ICD-10-CM | POA: Diagnosis not present

## 2019-12-20 DIAGNOSIS — Z833 Family history of diabetes mellitus: Secondary | ICD-10-CM | POA: Diagnosis not present

## 2019-12-20 DIAGNOSIS — D696 Thrombocytopenia, unspecified: Secondary | ICD-10-CM | POA: Diagnosis present

## 2019-12-20 DIAGNOSIS — Z86010 Personal history of colonic polyps: Secondary | ICD-10-CM

## 2019-12-20 DIAGNOSIS — Z87442 Personal history of urinary calculi: Secondary | ICD-10-CM

## 2019-12-20 DIAGNOSIS — Z4682 Encounter for fitting and adjustment of non-vascular catheter: Secondary | ICD-10-CM | POA: Diagnosis not present

## 2019-12-20 DIAGNOSIS — Z888 Allergy status to other drugs, medicaments and biological substances status: Secondary | ICD-10-CM | POA: Diagnosis not present

## 2019-12-20 DIAGNOSIS — U071 COVID-19: Secondary | ICD-10-CM | POA: Diagnosis present

## 2019-12-20 DIAGNOSIS — K5669 Other partial intestinal obstruction: Secondary | ICD-10-CM | POA: Diagnosis not present

## 2019-12-20 DIAGNOSIS — J1282 Pneumonia due to coronavirus disease 2019: Secondary | ICD-10-CM | POA: Diagnosis present

## 2019-12-20 DIAGNOSIS — E785 Hyperlipidemia, unspecified: Secondary | ICD-10-CM | POA: Diagnosis present

## 2019-12-20 DIAGNOSIS — K5651 Intestinal adhesions [bands], with partial obstruction: Secondary | ICD-10-CM | POA: Diagnosis present

## 2019-12-20 DIAGNOSIS — Z9049 Acquired absence of other specified parts of digestive tract: Secondary | ICD-10-CM | POA: Diagnosis not present

## 2019-12-20 DIAGNOSIS — G500 Trigeminal neuralgia: Secondary | ICD-10-CM | POA: Diagnosis present

## 2019-12-20 DIAGNOSIS — Z880 Allergy status to penicillin: Secondary | ICD-10-CM | POA: Diagnosis not present

## 2019-12-20 DIAGNOSIS — Z882 Allergy status to sulfonamides status: Secondary | ICD-10-CM

## 2019-12-20 DIAGNOSIS — F419 Anxiety disorder, unspecified: Secondary | ICD-10-CM | POA: Diagnosis present

## 2019-12-20 DIAGNOSIS — Z8249 Family history of ischemic heart disease and other diseases of the circulatory system: Secondary | ICD-10-CM | POA: Diagnosis not present

## 2019-12-20 DIAGNOSIS — J189 Pneumonia, unspecified organism: Secondary | ICD-10-CM | POA: Diagnosis not present

## 2019-12-20 DIAGNOSIS — J440 Chronic obstructive pulmonary disease with acute lower respiratory infection: Secondary | ICD-10-CM | POA: Diagnosis present

## 2019-12-20 DIAGNOSIS — Z0189 Encounter for other specified special examinations: Secondary | ICD-10-CM

## 2019-12-20 DIAGNOSIS — Z8719 Personal history of other diseases of the digestive system: Secondary | ICD-10-CM

## 2019-12-20 DIAGNOSIS — E860 Dehydration: Secondary | ICD-10-CM | POA: Diagnosis not present

## 2019-12-20 DIAGNOSIS — K5730 Diverticulosis of large intestine without perforation or abscess without bleeding: Secondary | ICD-10-CM | POA: Diagnosis not present

## 2019-12-20 DIAGNOSIS — K56600 Partial intestinal obstruction, unspecified as to cause: Secondary | ICD-10-CM

## 2019-12-20 DIAGNOSIS — D510 Vitamin B12 deficiency anemia due to intrinsic factor deficiency: Secondary | ICD-10-CM | POA: Diagnosis present

## 2019-12-20 DIAGNOSIS — F32A Depression, unspecified: Secondary | ICD-10-CM | POA: Diagnosis present

## 2019-12-20 DIAGNOSIS — Z87891 Personal history of nicotine dependence: Secondary | ICD-10-CM | POA: Diagnosis not present

## 2019-12-20 DIAGNOSIS — J4489 Other specified chronic obstructive pulmonary disease: Secondary | ICD-10-CM | POA: Diagnosis present

## 2019-12-20 DIAGNOSIS — J449 Chronic obstructive pulmonary disease, unspecified: Secondary | ICD-10-CM | POA: Diagnosis present

## 2019-12-20 DIAGNOSIS — Z885 Allergy status to narcotic agent status: Secondary | ICD-10-CM

## 2019-12-20 LAB — SARS CORONAVIRUS 2 (TAT 6-24 HRS): SARS Coronavirus 2: POSITIVE — AB

## 2019-12-20 LAB — URINALYSIS, ROUTINE W REFLEX MICROSCOPIC
Bilirubin Urine: NEGATIVE
Glucose, UA: NEGATIVE mg/dL
Hgb urine dipstick: NEGATIVE
Ketones, ur: NEGATIVE mg/dL
Leukocytes,Ua: NEGATIVE
Nitrite: NEGATIVE
Protein, ur: 30 mg/dL — AB
Specific Gravity, Urine: 1.019 (ref 1.005–1.030)
pH: 7 (ref 5.0–8.0)

## 2019-12-20 LAB — COMPREHENSIVE METABOLIC PANEL
ALT: 13 U/L (ref 0–44)
AST: 20 U/L (ref 15–41)
Albumin: 4 g/dL (ref 3.5–5.0)
Alkaline Phosphatase: 75 U/L (ref 38–126)
Anion gap: 10 (ref 5–15)
BUN: 7 mg/dL — ABNORMAL LOW (ref 8–23)
CO2: 31 mmol/L (ref 22–32)
Calcium: 9.2 mg/dL (ref 8.9–10.3)
Chloride: 93 mmol/L — ABNORMAL LOW (ref 98–111)
Creatinine, Ser: 0.97 mg/dL (ref 0.61–1.24)
GFR calc Af Amer: >60 mL/min (ref >60)
GFR calc non Af Amer: >60 mL/min (ref >60)
Glucose, Bld: 118 mg/dL — ABNORMAL HIGH (ref 70–99)
Potassium: 4.5 mmol/L (ref 3.5–5.1)
Sodium: 134 mmol/L — ABNORMAL LOW (ref 135–145)
Total Bilirubin: 0.7 mg/dL (ref 0.3–1.2)
Total Protein: 7.2 g/dL (ref 6.5–8.1)

## 2019-12-20 LAB — CBC
HCT: 53.4 % — ABNORMAL HIGH (ref 39.0–52.0)
Hemoglobin: 18.2 g/dL — ABNORMAL HIGH (ref 13.0–17.0)
MCH: 30.5 pg (ref 26.0–34.0)
MCHC: 34.1 g/dL (ref 30.0–36.0)
MCV: 89.6 fL (ref 80.0–100.0)
Platelets: 144 10*3/uL — ABNORMAL LOW (ref 150–400)
RBC: 5.96 MIL/uL — ABNORMAL HIGH (ref 4.22–5.81)
RDW: 12.6 % (ref 11.5–15.5)
WBC: 7.9 10*3/uL (ref 4.0–10.5)
nRBC: 0 % (ref 0.0–0.2)

## 2019-12-20 LAB — LIPASE, BLOOD: Lipase: 19 U/L (ref 11–51)

## 2019-12-20 MED ORDER — HYDROCHLOROTHIAZIDE 12.5 MG PO CAPS
12.5000 mg | ORAL_CAPSULE | Freq: Every day | ORAL | Status: DC
  Administered 2019-12-21 – 2019-12-27 (×7): 12.5 mg via ORAL
  Filled 2019-12-20 (×7): qty 1

## 2019-12-20 MED ORDER — PHENOL 1.4 % MT LIQD
1.0000 | OROMUCOSAL | Status: DC | PRN
  Filled 2019-12-20: qty 177

## 2019-12-20 MED ORDER — LOSARTAN POTASSIUM 50 MG PO TABS
50.0000 mg | ORAL_TABLET | Freq: Every day | ORAL | Status: DC
  Administered 2019-12-21 – 2019-12-27 (×7): 50 mg via ORAL
  Filled 2019-12-20 (×7): qty 1

## 2019-12-20 MED ORDER — ONDANSETRON 4 MG PO TBDP
4.0000 mg | ORAL_TABLET | Freq: Four times a day (QID) | ORAL | Status: DC | PRN
  Administered 2019-12-23 – 2019-12-24 (×3): 4 mg via ORAL
  Filled 2019-12-20 (×3): qty 1

## 2019-12-20 MED ORDER — SODIUM CHLORIDE 0.9% FLUSH
3.0000 mL | Freq: Once | INTRAVENOUS | Status: AC
  Administered 2019-12-20: 3 mL via INTRAVENOUS

## 2019-12-20 MED ORDER — ONDANSETRON HCL 4 MG/2ML IJ SOLN
4.0000 mg | Freq: Four times a day (QID) | INTRAMUSCULAR | Status: DC | PRN
  Administered 2019-12-20 – 2019-12-25 (×4): 4 mg via INTRAVENOUS
  Filled 2019-12-20 (×4): qty 2

## 2019-12-20 MED ORDER — ONDANSETRON HCL 4 MG/2ML IJ SOLN
4.0000 mg | Freq: Once | INTRAMUSCULAR | Status: AC
  Administered 2019-12-20: 4 mg via INTRAVENOUS
  Filled 2019-12-20: qty 2

## 2019-12-20 MED ORDER — DEXTROSE-NACL 5-0.9 % IV SOLN: INTRAVENOUS | Status: DC

## 2019-12-20 MED ORDER — SODIUM CHLORIDE 0.9 % IV BOLUS
1000.0000 mL | Freq: Once | INTRAVENOUS | Status: AC
  Administered 2019-12-20: 1000 mL via INTRAVENOUS

## 2019-12-20 MED ORDER — MORPHINE SULFATE (PF) 4 MG/ML IV SOLN
4.0000 mg | Freq: Once | INTRAVENOUS | Status: AC
  Administered 2019-12-20: 4 mg via INTRAVENOUS
  Filled 2019-12-20: qty 1

## 2019-12-20 MED ORDER — DIATRIZOATE MEGLUMINE & SODIUM 66-10 % PO SOLN
90.0000 mL | Freq: Once | ORAL | Status: AC
  Administered 2019-12-20: 90 mL via NASOGASTRIC
  Filled 2019-12-20: qty 90

## 2019-12-20 MED ORDER — ALPRAZOLAM 0.5 MG PO TABS
1.0000 mg | ORAL_TABLET | Freq: Every evening | ORAL | Status: DC | PRN
  Administered 2019-12-22 – 2019-12-26 (×5): 1 mg via ORAL
  Filled 2019-12-20 (×5): qty 2

## 2019-12-20 MED ORDER — MORPHINE SULFATE (PF) 2 MG/ML IV SOLN
1.0000 mg | INTRAVENOUS | Status: DC | PRN
  Administered 2019-12-20 – 2019-12-21 (×2): 2 mg via INTRAVENOUS
  Filled 2019-12-20 (×2): qty 1

## 2019-12-20 MED ORDER — IOHEXOL 300 MG/ML  SOLN
100.0000 mL | Freq: Once | INTRAMUSCULAR | Status: AC | PRN
  Administered 2019-12-20: 100 mL via INTRAVENOUS

## 2019-12-20 MED ORDER — ENOXAPARIN SODIUM 40 MG/0.4ML ~~LOC~~ SOLN
40.0000 mg | SUBCUTANEOUS | Status: DC
  Administered 2019-12-20 – 2019-12-26 (×7): 40 mg via SUBCUTANEOUS
  Filled 2019-12-20 (×7): qty 0.4

## 2019-12-20 NOTE — ED Notes (Signed)
51fr NG tube placed. X-ray called for placement.

## 2019-12-20 NOTE — H&P (Signed)
[name] is an 69 y.o. male.   Chief Complaint: Abdominal pain HPI: Patient is a 69 year old male who has history of COPD, hypertension, hyperlipidemia with a past surgical history significant for Nissen fundoplication, multiple post operative operations for complications, and now with a slipped Nissen patient has seen Dr. Hassell Done recently in clinic.  Patient comes in with a 24-hour history of abdominal pain, distention, some nausea however no emesis.  Patient states he usually takes MiraLAX on a daily basis to help with bowel movements.  Patient states his last bowel was yesterday and was normal.  Patient states he has been more nauseated in the ER today.  Patient underwent CT scan which revealed partial small bowel obstruction, likely due to adhesions.  Secondary to above general surgery was consulted for further evaluation and management.    Past Medical History:  Diagnosis Date  . Allergic rhinitis   . Anemia, pernicious   . Anxiety and depression    sees Dr. [name]  . Arthritis   . Catheter-associated urinary tract infection ([place])   . Diverticulosis of colon   . Elevated PSA    and hypogonadism, sees urologist  . Facial asymmetry   . Flatulence/gas pain/belching   . GERD (gastroesophageal reflux disease)    hiatal hernia  . Hiccups   . History of weight loss   . HTN (hypertension)   . Hx of blood transfusion reaction 1999  . Hyperlipidemia   . Hyperparathyroidism ([place])   . Hyperplastic colon polyp   . Hypertension    acei causes cough  . IBS (irritable bowel syndrome)   . Nephrolithiasis   . Obstructive chronic bronchitis without exacerbation, followed by Dr. [name] 03/04/2009   ONO RA  Normal 07/28/11 6 min walk >566m no desat PFTs 08/08/11: DLCO 70% TLC 80%  FeV1 80%   Fef 25 75 56% with significant improvement after BD   . Peritonitis ([place])    after surgery in 1999  . Pneumonia   . PONV (postoperative nausea and vomiting)   . Rectal fissure   . Trigeminal  neuralgia    has facial asymetry  . Ventral hernia     Past Surgical History:  Procedure Laterality Date  . ABDOMINAL SURGERY     Multiple  . BRAIN SURGERY  01/2011   Trigeminal nerve/Dr Vertell Limber  . CHOLECYSTECTOMY  1981  . DENTAL SURGERY  2019   tooth extraction, root canal  . HAND SURGERY  1973   Left  . HERNIA REPAIR  1999  . KNEE SURGERY  1997   Left  . LITHOTRIPSY     Right  . NISSEN FUNDOPLICATION  [id number]    complicated by gastric perforation, peritonitis, ventral nernia   . RHIZOTOMY Right 11/17/2014   Procedure: RHIZOTOMY TO RIGHT V2,V3;  Surgeon: Erline Levine, MD;  Location: [facility];  Service: Neurosurgery;  Laterality: Right;  right  . RHIZOTOMY Right 12/22/2014   Procedure: Right V2 and V3 trigeminal rhysolysis;  Surgeon: Erline Levine, MD;  Location: [facility];  Service: Neurosurgery;  Laterality: Right;  Right V2 and V3 trigeminal rhysolysis  . RHIZOTOMY W/ RADIOFREQUENCY ABLATION  08/2017   Dr [name]  . UPPER GASTROINTESTINAL ENDOSCOPY    . VENTRAL HERNIA REPAIR  2004    Family History  Problem Relation Age of Onset  . Diabetes Mother   . Coronary artery disease Mother        CABG  . Diabetes Sister   . Diabetes Brother   .  Hypertension Brother   . Heart disease Brother   . Heart disease Brother   . Colon cancer Neg Hx   . Esophageal cancer Neg Hx   . Stomach cancer Neg Hx    Social History:  reports that he quit smoking about 23 years ago. His smoking use included cigarettes. He has a 20.00 pack-year smoking history. He quit smokeless tobacco use about 36 years ago.  His smokeless tobacco use included chew. He reports that he does not drink alcohol or use drugs.  Allergies:  Allergies  Allergen Reactions  . Dilantin [Phenytoin]     bradycardia  . Dilaudid [Hydromorphone Hcl]   . Ace Inhibitors Cough  . Augmentin [Amoxicillin-Pot Clavulanate] Nausea And Vomiting  . Codeine Nausea And Vomiting  . Sulfa Antibiotics     hives    (Not in a  hospital admission)   Results for orders placed or performed during the hospital encounter of 12/20/19 (from the past 48 hour(s))  Lipase, blood     Status: None   Collection Time: 12/20/19 11:13 AM  Result Value Ref Range   Lipase 19 11 - 51 U/L    Comment: Performed at [facility], [address] [place], [place] [address]  Comprehensive metabolic panel     Status: Abnormal   Collection Time: 12/20/19 11:13 AM  Result Value Ref Range   Sodium 134 (L) 135 - 145 mmol/L   Potassium 4.5 3.5 - 5.1 mmol/L   Chloride 93 (L) 98 - 111 mmol/L   CO2 31 22 - 32 mmol/L   Glucose, Bld 118 (H) 70 - 99 mg/dL   BUN 7 (L) 8 - 23 mg/dL   Creatinine, Ser 0.97 0.61 - 1.24 mg/dL   Calcium 9.2 8.9 - 10.3 mg/dL   Total Protein 7.2 6.5 - 8.1 g/dL   Albumin 4.0 3.5 - 5.0 g/dL   AST 20 15 - 41 U/L   ALT 13 0 - 44 U/L   Alkaline Phosphatase 75 38 - 126 U/L   Total Bilirubin 0.7 0.3 - 1.2 mg/dL   GFR calc non Af Amer >60 >60 mL/min   GFR calc Af Amer >60 >60 mL/min   Anion gap 10 5 - 15    Comment: Performed at [facility], [place] [address], [place], [place] [address]  CBC     Status: Abnormal   Collection Time: 12/20/19 11:13 AM  Result Value Ref Range   WBC 7.9 4.0 - 10.5 K/uL   RBC 5.96 (H) 4.22 - 5.81 MIL/uL   Hemoglobin 18.2 (H) 13.0 - 17.0 g/dL   HCT 53.4 (H) 39.0 - 52.0 %   MCV 89.6 80.0 - 100.0 fL   MCH 30.5 26.0 - 34.0 pg   MCHC 34.1 30.0 - 36.0 g/dL   RDW 12.6 11.5 - 15.5 %   Platelets 144 (L) 150 - 400 K/uL    Comment: REPEATED TO VERIFY   nRBC 0.0 0.0 - 0.2 %    Comment: Performed at [facility], [place] [address], [place], [place] [address]   CT ABDOMEN PELVIS W CONTRAST  Result Date: 12/20/2019 CLINICAL DATA:  Lower abdominal pain for 2 days EXAM: CT ABDOMEN AND PELVIS WITH CONTRAST TECHNIQUE: Multidetector CT imaging of the abdomen and pelvis was performed using the standard protocol following bolus administration of intravenous contrast. CONTRAST:  179mL  OMNIPAQUE IOHEXOL 300 MG/ML  SOLN COMPARISON:  10/11/2017 FINDINGS: Lower chest: No acute abnormality. Hepatobiliary: No focal liver abnormality  is seen. Status post cholecystectomy. No biliary dilatation. Pancreas: Unremarkable. No pancreatic ductal dilatation or surrounding inflammatory changes. Spleen: Normal in size without focal abnormality. Adrenals/Urinary Tract: Adrenal glands are within normal limits. Kidneys demonstrate a normal enhancement pattern. No renal calculi are seen. Small renal cysts are noted. No obstructive changes are seen. The bladder is partially distended. Stomach/Bowel: Scattered diverticular change of the colon is noted without evidence of diverticulitis. The appendix is within normal limits. Multiple dilated loops of small bowel are noted. There is a transition zone in the anterior abdomen beneath areas of prior ventral hernia repair consistent with adhesions. The more distal small bowel is decompressed and within normal limits. Some mild free fluid is noted likely reactive in this area. Laxity of the anterior abdominal wall is seen without definitive herniation. Vascular/Lymphatic: Aortic atherosclerosis. No enlarged abdominal or pelvic lymph nodes. Reproductive: Prostate is enlarged in size. Other: Laxity of the anterior abdominal wall is noted without true herniation. Mild free fluid is noted as described above. Musculoskeletal: No fracture is seen. IMPRESSION: Changes consistent with partial small bowel obstruction in the region of the proximal ileum. This is felt to be related to adhesions given the transition zone anteriorly with multiple clustered loops of small bowel in this region. Diverticulosis without diverticulitis. No other focal abnormality is noted. Electronically Signed   By: Inez Catalina M.D.   On: 12/20/2019 13:45    Review of Systems  Constitutional: Negative for chills and fever.  HENT: Negative for ear discharge, hearing loss and sore throat.   Eyes: Negative  for discharge.  Respiratory: Negative for cough and shortness of breath.   Cardiovascular: Negative for chest pain and leg swelling.  Gastrointestinal: Positive for abdominal distention and nausea. Negative for abdominal pain, constipation, diarrhea and vomiting.  Musculoskeletal: Negative for myalgias and neck pain.  Skin: Negative for rash.  Allergic/Immunologic: Negative for environmental allergies.  Neurological: Negative for dizziness and seizures.  Hematological: Does not bruise/bleed easily.  Psychiatric/Behavioral: Negative for suicidal ideas.  All other systems reviewed and are negative.   Blood pressure 104/63, pulse 84, temperature 97.9 F (36.6 C), temperature source Oral, resp. rate 18, SpO2 97 %. Physical Exam  Constitutional: He is oriented to person, place, and time. Vital signs are normal. He appears well-developed and well-nourished.  Conversant No acute distress  Eyes: Lids are normal. No scleral icterus.  Pupils are equal round and reactive No lid lag Moist conjunctiva  Neck: No tracheal tenderness present. No thyromegaly present.  No cervical lymphadenopathy  Cardiovascular: Normal rate, regular rhythm and intact distal pulses.  No murmur heard. Respiratory: Effort normal and breath sounds normal. He has no wheezes. He has no rales.  GI: Soft. Bowel sounds are normal. He exhibits distension. He exhibits no mass. There is no hepatosplenomegaly. There is no abdominal tenderness. There is no rebound and no guarding. No hernia.  Neurological: He is alert and oriented to person, place, and time.  Normal gait and station  Skin: Skin is warm. No rash noted. No cyanosis. Nails show no clubbing.  Normal skin turgor  Psychiatric: Judgment normal.  Appropriate affect     Assessment/Plan 69 year old male with partial small bowel obstruction Hypertension Hyperlipidemia Slipped Nissen Multiple abdominal surgeries secondary to complications after Nissen  fundoplication.  1.  We will admit the patient to the floor, start SBO protocol. 2.  We will avoid NG tube as patient has significant nausea vomiting.    Ralene Ok, MD 12/20/2019, 2:54 PM

## 2019-12-20 NOTE — ED Triage Notes (Addendum)
C/o generalized abd pain since last night that radiates to flanks with nausea.  Denies vomiting, diarrhea, and urinary complaints.  Family member very concerned about visitor policy and not being able to stay with pt in ED.  Pt able to answer all questions at this time.  Number to call for updates given to family.  She would like to be notified before pt given any medications.

## 2019-12-20 NOTE — ED Provider Notes (Signed)
[facility] EMERGENCY DEPARTMENT Provider Note   CSN: [id number] Arrival date & time: 12/20/19  1100     History Chief Complaint  Patient presents with  . Abdominal Pain    [name] is a 69 y.o. male with history of diverticulosis, anxiety and depression, hyperlipidemia, hypertension, IBS, nephrolithiasis, trigeminal neuralgia, ventral hernia presents for evaluation of acute onset, persistent abdominal pain since 12:30 AM this morning.  [name] reports that the pain awoke him from his sleep and [name] went to bed in his usual state of health.  [name] reports the pain is constant, stabbing, radiates all around the abdomen and to his back and flanks bilaterally.  No aggravating or alleviating factors noted.  [name] has not tried anything for his symptoms.  [name] notes nausea but no vomiting but has not had anything to eat or drink today.  [name] denies fevers, chest pain, shortness of breath, diarrhea, urinary symptoms, melena, hematochezia.  [name] states that his symptoms feel similar to the last time [name] had a small bowel obstruction.  [name] reports [name] has had multiple abdominal surgeries.  [name] tells me that [name] feels as though his abdomen is protuberant and [name] has been belching but has not been passing any flatus today.  The history is provided by the patient.       Past Medical History:  Diagnosis Date  . Allergic rhinitis   . Anemia, pernicious   . Anxiety and depression    sees Dr. [name]  . Arthritis   . Catheter-associated urinary tract infection ([place])   . Diverticulosis of colon   . Elevated PSA    and hypogonadism, sees urologist  . Facial asymmetry   . Flatulence/gas pain/belching   . GERD (gastroesophageal reflux disease)    hiatal hernia  . Hiccups   . History of weight loss   . HTN (hypertension)   . Hx of blood transfusion reaction 1999  . Hyperlipidemia   . Hyperparathyroidism ([place])   . Hyperplastic colon polyp   . Hypertension    acei causes cough  . IBS  (irritable bowel syndrome)   . Nephrolithiasis   . Obstructive chronic bronchitis without exacerbation, followed by Dr. [name] 03/04/2009   ONO RA  Normal 07/28/11 6 min walk >566m no desat PFTs 08/08/11: DLCO 70% TLC 80%  FeV1 80%   Fef 25 75 56% with significant improvement after BD   . Peritonitis ([place])    after surgery in 1999  . Pneumonia   . PONV (postoperative nausea and vomiting)   . Rectal fissure   . Trigeminal neuralgia    has facial asymetry  . Ventral hernia     Patient Active Problem List   Diagnosis Date Noted  . SBO (small bowel obstruction) ([place]) 12/20/2019  . Multifocal pneumonia 09/12/2017  . Enteritis 09/12/2017  . Hyponatremia 09/12/2017  . CAP (community acquired pneumonia) 09/12/2017  . Acute sinusitis 07/06/2017  . Hyperglycemia 01/18/2017  . Preventative health care 01/13/2016  . Absolute anemia 10/15/2015  . Cervical spondylosis, treated by Dr. Gladstone Lighter, [facility] 06/12/2013  . Ventral hernia 03/04/2013  . Anxiety and depression, followed by Dr. [name] in Psych 11/19/2012  . Hiatal hernia, followed by Dr. Hassell Done ([place]) and Dr. Olevia Perches (GI) 11/19/2012  . Other testicular hypofunction, followed by Dr. Karsten Ro in Urology 10/15/2012  . Trigeminal neuralgia, s/p NSU, followed by Dr. Vertell Limber 10/15/2012  . BPH (benign prostatic hyperplasia), followed by Dr. Karsten Ro 07/26/2011  . Rosacea 09/02/2010  . COLONIC POLYPS,  HYPERPLASTIC, HX OF 12/13/2009  . Obstructive chronic bronchitis without exacerbation, followed by Dr. [name] 03/04/2009  . Allergic rhinitis 04/03/2008  . Essential hypertension 05/08/2007  . GERD followed by Dr. Olevia Perches 05/08/2007    Past Surgical History:  Procedure Laterality Date  . ABDOMINAL SURGERY     Multiple  . BRAIN SURGERY  01/2011   Trigeminal nerve/Dr Vertell Limber  . CHOLECYSTECTOMY  1981  . DENTAL SURGERY  2019   tooth extraction, root canal  . HAND SURGERY  1973   Left  . HERNIA REPAIR  1999  . KNEE SURGERY  1997   Left  .  LITHOTRIPSY     Right  . NISSEN FUNDOPLICATION  [id number]    complicated by gastric perforation, peritonitis, ventral nernia   . RHIZOTOMY Right 11/17/2014   Procedure: RHIZOTOMY TO RIGHT V2,V3;  Surgeon: Erline Levine, MD;  Location: [facility] ORS;  Service: Neurosurgery;  Laterality: Right;  right  . RHIZOTOMY Right 12/22/2014   Procedure: Right V2 and V3 trigeminal rhysolysis;  Surgeon: Erline Levine, MD;  Location: [facility] ORS;  Service: Neurosurgery;  Laterality: Right;  Right V2 and V3 trigeminal rhysolysis  . RHIZOTOMY W/ RADIOFREQUENCY ABLATION  08/2017   Dr [name]  . UPPER GASTROINTESTINAL ENDOSCOPY    . VENTRAL HERNIA REPAIR  2004       Family History  Problem Relation Age of Onset  . Diabetes Mother   . Coronary artery disease Mother        CABG  . Diabetes Sister   . Diabetes Brother   . Hypertension Brother   . Heart disease Brother   . Heart disease Brother   . Colon cancer Neg Hx   . Esophageal cancer Neg Hx   . Stomach cancer Neg Hx     Social History   Tobacco Use  . Smoking status: Former Smoker    Packs/day: 1.00    Years: 20.00    Pack years: 20.00    Types: Cigarettes    Quit date: 11/06/1996    Years since quitting: 23.1  . Smokeless tobacco: Former Systems developer    Types: Chew    Quit date: 11/07/1983  Substance Use Topics  . Alcohol use: No  . Drug use: No    Home Medications Prior to Admission medications   Medication Sig Start Date End Date Taking? Authorizing Provider  ALPRAZolam Duanne Moron) 1 MG tablet Take 1 mg by mouth at bedtime as needed for anxiety.    Yes [provider]  baclofen (LIORESAL) 10 MG tablet Take 10 mg by mouth daily as needed for muscle spasms.   Yes [provider]  carbamazepine (TEGRETOL XR) 100 MG 12 hr tablet Take 100 mg by mouth 3 (three) times daily.   Yes [provider]  cholecalciferol (VITAMIN D3) 25 MCG (1000 UNIT) tablet Take 1,000 Units by mouth daily.   Yes [provider]  DEXILANT 60 MG  capsule TAKE 1 CAPSULE BY MOUTH DAILY 11/12/18  Yes Nandigam, Venia Minks, MD  esomeprazole (NEXIUM) 40 MG capsule Take 40 mg by mouth daily at 12 noon.   Yes [provider]  gabapentin (NEURONTIN) 400 MG capsule Take 400 mg by mouth 4 (four) times daily.    Yes [provider]  losartan-hydrochlorothiazide (HYZAAR) 50-12.5 MG tablet Take 1 tablet by mouth daily. 05/28/19  Yes [name], MD  polyethylene glycol powder (GLYCOLAX/MIRALAX) powder DISSOLVE 1 CAPFUL IN LIQUID EVERY DAY AS NEEDED FOR CONSTIPATION 02/18/14  Yes Colin Benton  R, DO  Probiotic Product (ALIGN) 4 MG CAPS Take by mouth daily.   Yes [provider]  ranitidine (ZANTAC) 300 MG tablet Take 1 tablet (300 mg total) by mouth at bedtime. Patient taking differently: Take 300 mg by mouth as needed.  07/16/18  Yes Nandigam, Venia Minks, MD  sucralfate (CARAFATE) 1 g tablet TAKE 1 TABLET BY MOUTH THREE TIMES DAILY Patient taking differently: Take 1 g by mouth 4 (four) times daily -  with meals and at bedtime.  10/21/18  Yes Nandigam, Venia Minks, MD  pantoprazole (PROTONIX) 40 MG tablet Take 1 tablet (40 mg total) by mouth daily. 30 minutes before breakfast Patient not taking: Reported on 12/20/2019 01/02/19   Mauri Pole, MD    Allergies    Dilantin [phenytoin], Dilaudid [hydromorphone hcl], Ace inhibitors, Augmentin [amoxicillin-pot clavulanate], Codeine, and Sulfa antibiotics  Review of Systems   Review of Systems  Constitutional: Negative for chills and fever.  Respiratory: Negative for shortness of breath.   Cardiovascular: Negative for chest pain.  Gastrointestinal: Positive for abdominal distention, abdominal pain and nausea. Negative for vomiting.  Genitourinary: Negative for dysuria, frequency, hematuria and urgency.  All other systems reviewed and are negative.   Physical Exam Updated Vital Signs BP 104/63   Pulse 84   Temp 97.9 F (36.6 C) (Oral)   Resp 18   SpO2 97%   Physical Exam  Vitals and nursing note reviewed.  Constitutional:      General: [name] is not in acute distress.    Appearance: [name] is well-developed.  HENT:     Head: Normocephalic and atraumatic.  Eyes:     General:        Right eye: No discharge.        Left eye: No discharge.     Conjunctiva/sclera: Conjunctivae normal.  Neck:     Vascular: No JVD.     Trachea: No tracheal deviation.  Cardiovascular:     Rate and Rhythm: Normal rate.  Pulmonary:     Effort: Pulmonary effort is normal.  Abdominal:     General: Abdomen is protuberant. A surgical scar is present. Bowel sounds are decreased. There is distension.     Palpations: Abdomen is soft.     Tenderness: There is generalized abdominal tenderness. There is no right CVA tenderness, left CVA tenderness, guarding or rebound.     Hernia: A hernia is present. Hernia is present in the ventral area.  Skin:    General: Skin is warm and dry.     Findings: No erythema.  Neurological:     Mental Status: [name] is alert.  Psychiatric:        Behavior: Behavior normal.     ED Results / Procedures / Treatments   Labs (all labs ordered are listed, but only abnormal results are displayed) Labs Reviewed  COMPREHENSIVE METABOLIC PANEL - Abnormal; Notable for the following components:      Result Value   Sodium 134 (*)    Chloride 93 (*)    Glucose, Bld 118 (*)    BUN 7 (*)    All other components within normal limits  CBC - Abnormal; Notable for the following components:   RBC 5.96 (*)    Hemoglobin 18.2 (*)    HCT 53.4 (*)    Platelets 144 (*)    All other components within normal limits  SARS CORONAVIRUS 2 (TAT 6-24 HRS)  LIPASE, BLOOD  URINALYSIS, ROUTINE W REFLEX MICROSCOPIC  HIV ANTIBODY (ROUTINE TESTING  W REFLEX)    EKG None  Radiology CT ABDOMEN PELVIS W CONTRAST  Result Date: 12/20/2019 CLINICAL DATA:  Lower abdominal pain for 2 days EXAM: CT ABDOMEN AND PELVIS WITH CONTRAST TECHNIQUE: Multidetector CT imaging of the abdomen and  pelvis was performed using the standard protocol following bolus administration of intravenous contrast. CONTRAST:  181mL OMNIPAQUE IOHEXOL 300 MG/ML  SOLN COMPARISON:  10/11/2017 FINDINGS: Lower chest: No acute abnormality. Hepatobiliary: No focal liver abnormality is seen. Status post cholecystectomy. No biliary dilatation. Pancreas: Unremarkable. No pancreatic ductal dilatation or surrounding inflammatory changes. Spleen: Normal in size without focal abnormality. Adrenals/Urinary Tract: Adrenal glands are within normal limits. Kidneys demonstrate a normal enhancement pattern. No renal calculi are seen. Small renal cysts are noted. No obstructive changes are seen. The bladder is partially distended. Stomach/Bowel: Scattered diverticular change of the colon is noted without evidence of diverticulitis. The appendix is within normal limits. Multiple dilated loops of small bowel are noted. There is a transition zone in the anterior abdomen beneath areas of prior ventral hernia repair consistent with adhesions. The more distal small bowel is decompressed and within normal limits. Some mild free fluid is noted likely reactive in this area. Laxity of the anterior abdominal wall is seen without definitive herniation. Vascular/Lymphatic: Aortic atherosclerosis. No enlarged abdominal or pelvic lymph nodes. Reproductive: Prostate is enlarged in size. Other: Laxity of the anterior abdominal wall is noted without true herniation. Mild free fluid is noted as described above. Musculoskeletal: No fracture is seen. IMPRESSION: Changes consistent with partial small bowel obstruction in the region of the proximal ileum. This is felt to be related to adhesions given the transition zone anteriorly with multiple clustered loops of small bowel in this region. Diverticulosis without diverticulitis. No other focal abnormality is noted. Electronically Signed   By: Inez Catalina M.D.   On: 12/20/2019 13:45    Procedures Procedures  (including critical care time)  Medications Ordered in ED Medications  sodium chloride flush (NS) 0.9 % injection 3 mL (has no administration in time range)  sodium chloride 0.9 % bolus 1,000 mL (has no administration in time range)  losartan (COZAAR) tablet 50 mg (has no administration in time range)  ALPRAZolam (XANAX) tablet 1 mg (has no administration in time range)  diatrizoate meglumine-sodium (GASTROGRAFIN) 66-10 % solution 90 mL (has no administration in time range)  enoxaparin (LOVENOX) injection 40 mg (has no administration in time range)  dextrose 5 %-0.9 % sodium chloride infusion (has no administration in time range)  ondansetron (ZOFRAN-ODT) disintegrating tablet 4 mg (has no administration in time range)    Or  ondansetron (ZOFRAN) injection 4 mg (has no administration in time range)  hydrochlorothiazide (MICROZIDE) capsule 12.5 mg (has no administration in time range)  morphine 4 MG/ML injection 4 mg (4 mg Intravenous Given 12/20/19 1240)  ondansetron (ZOFRAN) injection 4 mg (4 mg Intravenous Given 12/20/19 1240)  iohexol (OMNIPAQUE) 300 MG/ML solution 100 mL (100 mLs Intravenous Contrast Given 12/20/19 1321)    ED Course  I have reviewed the triage vital signs and the nursing notes.  Pertinent labs & imaging results that were available during my care of the patient were reviewed by me and considered in my medical decision making (see chart for details).    MDM Rules/Calculators/A&P                      Patient presenting for evaluation of sudden onset generalized abdominal pain with associated nausea and abdominal bloating.  [name] is afebrile, initially hypertensive in the ED with improvement on reevaluation.  No peritoneal signs on examination of the abdomen.  [name] has a history of multiple abdominal surgeries and known ventral hernia.  Lab work reviewed and interpreted by myself shows no leukocytosis, elevated hemoglobin and hematocrit suggesting hemoconcentration and possible  dehydration, no renal insufficiency, no metabolic derangements.  LFTs and lipase are within normal limits.  CT scan shows changes consistent with partial small bowel obstruction likely due to adhesions.  2:39 PM CONSULT: Spoke with Dr. Rosendo Gros with general surgery, [name] will plan to see and evaluate the patient emergently in the ED to determine disposition.   3:27 PM Dr. Rosendo Gros has seen and evaluated the patient emergently in the ED, plan for general surgery to admit to the hospital for further evaluation and management..   Final Clinical Impression(s) / ED Diagnoses Final diagnoses:  Partial small bowel obstruction [facility])    Rx / DC Orders ED Discharge Orders    None       Debroah Baller 12/20/19 1527    Lajean Saver, MD 12/21/19 [phone]

## 2019-12-20 NOTE — Plan of Care (Signed)

## 2019-12-20 NOTE — Progress Notes (Signed)
Received pt from ER . NG tube in place. Pt oriented to room call light within reach . Safety measures in place.

## 2019-12-21 ENCOUNTER — Inpatient Hospital Stay (HOSPITAL_COMMUNITY): Payer: Medicare Other

## 2019-12-21 LAB — BASIC METABOLIC PANEL
Anion gap: 12 (ref 5–15)
BUN: 10 mg/dL (ref 8–23)
CO2: 30 mmol/L (ref 22–32)
Calcium: 8.4 mg/dL — ABNORMAL LOW (ref 8.9–10.3)
Chloride: 93 mmol/L — ABNORMAL LOW (ref 98–111)
Creatinine, Ser: 0.98 mg/dL (ref 0.61–1.24)
GFR calc Af Amer: >60 mL/min (ref >60)
GFR calc non Af Amer: >60 mL/min (ref >60)
Glucose, Bld: 130 mg/dL — ABNORMAL HIGH (ref 70–99)
Potassium: 4.1 mmol/L (ref 3.5–5.1)
Sodium: 135 mmol/L (ref 135–145)

## 2019-12-21 LAB — CBC
HCT: 52.1 % — ABNORMAL HIGH (ref 39.0–52.0)
Hemoglobin: 17.5 g/dL — ABNORMAL HIGH (ref 13.0–17.0)
MCH: 30.5 pg (ref 26.0–34.0)
MCHC: 33.6 g/dL (ref 30.0–36.0)
MCV: 90.9 fL (ref 80.0–100.0)
Platelets: 134 10*3/uL — ABNORMAL LOW (ref 150–400)
RBC: 5.73 MIL/uL (ref 4.22–5.81)
RDW: 13 % (ref 11.5–15.5)
WBC: 10.2 10*3/uL (ref 4.0–10.5)
nRBC: 0 % (ref 0.0–0.2)

## 2019-12-21 LAB — HIV ANTIBODY (ROUTINE TESTING W REFLEX): HIV Screen 4th Generation wRfx: NONREACTIVE

## 2019-12-21 LAB — SARS CORONAVIRUS 2 (TAT 6-24 HRS): SARS Coronavirus 2: POSITIVE — AB

## 2019-12-21 LAB — GLUCOSE, CAPILLARY: Glucose-Capillary: 70 mg/dL (ref 70–99)

## 2019-12-21 NOTE — Progress Notes (Signed)
Patient ID: [name], male   DOB: 1951-09-18, 69 y.o.   MRN: [id number]       Subjective: Patient feels better today with NGT in place.  About 900 cc of bilious output from NGT.  Small amount of flatus.  No BM.  Concerned about having a negative and a positive covid test and requests another to confirm his diagnosis.  ROS: See above, otherwise other systems negative  Objective: Vital signs in last 24 hours: Temp:  [97.8 F (36.6 C)-99 F (37.2 C)] 98.4 F (36.9 C) (02/14 0740) Pulse Rate:  [79-99] 87 (02/14 0740) Resp:  [16-22] 19 (02/14 0740) BP: (104-163)/(63-97) 128/81 (02/14 0740) SpO2:  [96 %-98 %] 98 % (02/14 0740) Last BM Date: 12/19/19  Intake/Output from previous day: 02/13 0701 - 02/14 0700 In: 1000 [IV Piggyback:1000] Out: 450 [Urine:300; Emesis/NG output:150] Intake/Output this shift: Total I/O In: 83 [P.O.:60] Out: 720 [Emesis/NG output:720]  PE: Gen: NAD HEENT: PERRL, NGt in place with bilious output Heart: regular Lungs: CTAB Abd: soft, minimal diffuse tenderness, multiple scars noted all over his abdomen from prior surgery, few BS, ND Ext: MAE, NVI Psych: A&Ox3  Lab Results:  Recent Labs    12/20/19 1113 12/21/19 0340  WBC 7.9 10.2  HGB 18.2* 17.5*  HCT 53.4* 52.1*  PLT 144* 134*   BMET Recent Labs    12/20/19 1113 12/21/19 0340  NA 134* 135  K 4.5 4.1  CL 93* 93*  CO2 31 30  GLUCOSE 118* 130*  BUN 7* 10  CREATININE 0.97 0.98  CALCIUM 9.2 8.4*   PT/INR No results for input(s): LABPROT, INR in the last 72 hours. CMP     Component Value Date/Time   NA 135 12/21/2019 0340   K 4.1 12/21/2019 0340   CL 93 (L) 12/21/2019 0340   CO2 30 12/21/2019 0340   GLUCOSE 130 (H) 12/21/2019 0340   GLUCOSE 118 (H) 10/25/2006 1414   BUN 10 12/21/2019 0340   CREATININE 0.98 12/21/2019 0340   CREATININE 0.97 12/31/2012 1108   CALCIUM 8.4 (L) 12/21/2019 0340   CALCIUM 9.3 01/23/2012 1045   PROT 7.2 12/20/2019 1113   ALBUMIN 4.0  12/20/2019 1113   AST 20 12/20/2019 1113   ALT 13 12/20/2019 1113   ALKPHOS 75 12/20/2019 1113   BILITOT 0.7 12/20/2019 1113   GFRNONAA >60 12/21/2019 0340   GFRAA >60 12/21/2019 0340   Lipase     Component Value Date/Time   LIPASE 19 12/20/2019 1113       Studies/Results: CT ABDOMEN PELVIS W CONTRAST  Result Date: 12/20/2019 CLINICAL DATA:  Lower abdominal pain for 2 days EXAM: CT ABDOMEN AND PELVIS WITH CONTRAST TECHNIQUE: Multidetector CT imaging of the abdomen and pelvis was performed using the standard protocol following bolus administration of intravenous contrast. CONTRAST:  157mL OMNIPAQUE IOHEXOL 300 MG/ML  SOLN COMPARISON:  10/11/2017 FINDINGS: Lower chest: No acute abnormality. Hepatobiliary: No focal liver abnormality is seen. Status post cholecystectomy. No biliary dilatation. Pancreas: Unremarkable. No pancreatic ductal dilatation or surrounding inflammatory changes. Spleen: Normal in size without focal abnormality. Adrenals/Urinary Tract: Adrenal glands are within normal limits. Kidneys demonstrate a normal enhancement pattern. No renal calculi are seen. Small renal cysts are noted. No obstructive changes are seen. The bladder is partially distended. Stomach/Bowel: Scattered diverticular change of the colon is noted without evidence of diverticulitis. The appendix is within normal limits. Multiple dilated loops of small bowel are noted. There is a transition zone in the anterior abdomen beneath  areas of prior ventral hernia repair consistent with adhesions. The more distal small bowel is decompressed and within normal limits. Some mild free fluid is noted likely reactive in this area. Laxity of the anterior abdominal wall is seen without definitive herniation. Vascular/Lymphatic: Aortic atherosclerosis. No enlarged abdominal or pelvic lymph nodes. Reproductive: Prostate is enlarged in size. Other: Laxity of the anterior abdominal wall is noted without true herniation. Mild free  fluid is noted as described above. Musculoskeletal: No fracture is seen. IMPRESSION: Changes consistent with partial small bowel obstruction in the region of the proximal ileum. This is felt to be related to adhesions given the transition zone anteriorly with multiple clustered loops of small bowel in this region. Diverticulosis without diverticulitis. No other focal abnormality is noted. Electronically Signed   By: Inez Catalina M.D.   On: 12/20/2019 13:45   DG Abd Portable 1V-Small Bowel Obstruction Protocol-initial, 8 hr delay  Result Date: 12/21/2019 CLINICAL DATA:  Small bowel obstruction. Follow-up. EXAM: PORTABLE ABDOMEN - 1 VIEW COMPARISON:  12/20/2019 FINDINGS: Stable enteric tube with tip in the body of stomach. Changes of partial small bowel obstruction again noted with persistent dilated loops of small bowel within the left abdomen measuring up to 4.3 cm. No free air. IMPRESSION: Partial small bowel obstruction pattern, similar to previous exam. Electronically Signed   By: Kerby Moors M.D.   On: 12/21/2019 06:38   DG Abd Portable 1V-Small Bowel Protocol-Position Verification  Result Date: 12/20/2019 CLINICAL DATA:  NG tube placement. EXAM: PORTABLE ABDOMEN - 1 VIEW COMPARISON:  None. FINDINGS: Enteric tube appears appropriately positioned in the stomach. Surgical clips are seen in the RIGHT upper quadrant and LEFT upper quadrant. Mildly prominent small bowel loops noted within the central abdomen and LEFT upper quadrant, corresponding to the distended small bowel loops demonstrated on abdominal CT earlier today. IMPRESSION: Enteric tube appears appropriately positioned in the stomach. Electronically Signed   By: Franki Cabot M.D.   On: 12/20/2019 17:01    Anti-infectives: Anti-infectives (From admission, onward)   None       Assessment/Plan COVID + - asymptomatic, but requests a repeat test as he had a negative test on Tuesday and is concerned about a negative test and then a  positive test.  This test has been ordered.  PSBO -multiple prior surgeries.  Appears to have a piece of small bowel kinked to his abdominal wall as transition point. -x-ray today with air in his colon, but still with some dilated small bowel and 900cc from his NGT. -leave NGT for today and recheck film in the am.  Hopefully will resolve without surgical intervention  FEN - NPO/NGT/IVFs VTE - Lovenox ID - none   LOS: 1 day    Henreitta Cea , [facility] 12/21/2019, 10:38 AM Please see Amion for pager number during day hours 7:00am-4:30pm or 7:00am -11:30am on weekends

## 2019-12-21 NOTE — Progress Notes (Signed)
Patient's wife, [name], was called and update on her husband;s condition. All questions were answered. Will continue to monitor.

## 2019-12-21 NOTE — Progress Notes (Signed)
Pt. had a COVID test come back positive. Pt. will be moved to [id number]. Report given to RN [place].

## 2019-12-21 NOTE — Progress Notes (Signed)
[name] [id number] Admission Data: 12/21/2019 1:06 AM Attending Provider: Nolon Nations, MD  [id number], [name], MD Consults/ Treatment Team:   [name] is a 69 y.o. male patient transferred from 6N awake, alert  & orientated  X 3,  Full Code, VSS - Blood pressure (!) 141/86, pulse 88, temperature 98.6 F (37 C), temperature source Oral, resp. rate 18, SpO2 98 %., room air, no c/o shortness of breath, no c/o chest pain, no distress noted.   IV site WDL:  hand left, condition patent and no redness with a transparent dsg that's clean dry and intact.  Allergies:   Allergies  Allergen Reactions  . Dilantin [Phenytoin]     bradycardia  . Dilaudid [Hydromorphone Hcl]   . Ace Inhibitors Cough  . Augmentin [Amoxicillin-Pot Clavulanate] Nausea And Vomiting  . Codeine Nausea And Vomiting  . Sulfa Antibiotics     hives     Past Medical History:  Diagnosis Date  . Allergic rhinitis   . Anemia, pernicious   . Anxiety and depression    sees Dr. [name]  . Arthritis   . Catheter-associated urinary tract infection ([place])   . Diverticulosis of colon   . Elevated PSA    and hypogonadism, sees urologist  . Facial asymmetry   . Flatulence/gas pain/belching   . GERD (gastroesophageal reflux disease)    hiatal hernia  . Hiccups   . History of weight loss   . HTN (hypertension)   . Hx of blood transfusion reaction 1999  . Hyperlipidemia   . Hyperparathyroidism ([place])   . Hyperplastic colon polyp   . Hypertension    acei causes cough  . IBS (irritable bowel syndrome)   . Nephrolithiasis   . Obstructive chronic bronchitis without exacerbation, followed by Dr. [name] 03/04/2009   ONO RA  Normal 07/28/11 6 min walk >582m no desat PFTs 08/08/11: DLCO 70% TLC 80%  FeV1 80%   Fef 25 75 56% with significant improvement after BD   . Peritonitis ([place])    after surgery in 1999  . Pneumonia   . PONV (postoperative nausea and vomiting)   . Rectal fissure   . Trigeminal neuralgia    has  facial asymetry  . Ventral hernia    Pt orientation to unit, room and routine. Information packet given to patient/family and safety video watched.  Admission INP armband ID verified with patient/family, and in place. SR up x 2, fall risk assessment complete with Patient and family verbalizing understanding of risks associated with falls. Pt verbalizes an understanding of how to use the call bell and to call for help before getting out of bed.  Skin, clean-dry- intact without evidence of bruising, or skin tears.   No evidence of skin break down noted on exam. no rashes, no ecchymoses, no wounds  Will cont to monitor and assist as needed.  Walker Shadow, RN 12/21/2019 1:06 AM

## 2019-12-21 NOTE — Progress Notes (Signed)
CRITICAL VALUE ALERT  Critical Value:  COVID test - positive  Date & Time Notied:  12/20/2018; 16:02  Provider Notified:  Saverio Danker, PA.  Orders Received/Actions taken: patient already on isolation

## 2019-12-22 ENCOUNTER — Telehealth: Payer: Self-pay

## 2019-12-22 ENCOUNTER — Inpatient Hospital Stay (HOSPITAL_COMMUNITY): Payer: Medicare Other

## 2019-12-22 ENCOUNTER — Telehealth: Payer: Self-pay | Admitting: Gastroenterology

## 2019-12-22 MED ORDER — CARBAMAZEPINE ER 100 MG PO TB12
100.0000 mg | ORAL_TABLET | Freq: Three times a day (TID) | ORAL | Status: DC
  Administered 2019-12-22 – 2019-12-27 (×16): 100 mg via ORAL
  Filled 2019-12-22 (×19): qty 1

## 2019-12-22 MED ORDER — PANTOPRAZOLE SODIUM 40 MG IV SOLR
40.0000 mg | Freq: Every day | INTRAVENOUS | Status: DC
  Administered 2019-12-22 – 2019-12-27 (×6): 40 mg via INTRAVENOUS
  Filled 2019-12-22 (×6): qty 40

## 2019-12-22 MED ORDER — GABAPENTIN 400 MG PO CAPS
400.0000 mg | ORAL_CAPSULE | Freq: Four times a day (QID) | ORAL | Status: DC
  Administered 2019-12-22 – 2019-12-27 (×19): 400 mg via ORAL
  Filled 2019-12-22 (×19): qty 1

## 2019-12-22 MED ORDER — METOPROLOL TARTRATE 5 MG/5ML IV SOLN: 5.0000 mg | Freq: Three times a day (TID) | INTRAVENOUS | Status: DC | PRN

## 2019-12-22 MED ORDER — PROMETHAZINE HCL 25 MG/ML IJ SOLN
12.5000 mg | Freq: Four times a day (QID) | INTRAMUSCULAR | Status: DC | PRN
  Administered 2019-12-22: 12.5 mg via INTRAVENOUS
  Administered 2019-12-24: 25 mg via INTRAVENOUS
  Filled 2019-12-22 (×2): qty 1

## 2019-12-22 NOTE — Progress Notes (Signed)
I called and spoke with the patients wife on the wife.  I updated her on the patients current hospital course.  Time was given for all questions to be asked and were answered to the best of my abilities. The patients wife expressed thanks at the end of the conversation.

## 2019-12-22 NOTE — Telephone Encounter (Signed)
Patient's wife, Velva Harman, called and spoke with [facility] [employer] on  12/20/2019 8:35:00 AM States that "her husband has abdominal pain and gas and has had a history of bowel blockage. She has spoken with the GI doctor advised to keep him on fluids and talk to doctor on Monday. She does not feel he can wait until Monday due to pain. Temp 99. No vomiting or diarrhea, last BM was yesterday morning. He takes miralax daily."  [facility] advised "Go to ED now."  Patient currently admitted.

## 2019-12-22 NOTE — Progress Notes (Signed)
Updated that wife about the plan of care.  She reqeusted to speak to Dr.Martin and wanted to know if he was contacted. I explain to her that PA saw patient today and she then wanted to speak to PA. PA Alferd Apa paged with wife's information.

## 2019-12-22 NOTE — Telephone Encounter (Signed)
Pt's wife Velva Harman reported that pt is hospitalised for small bowel obstructions.  She is very concerned and requested a call back.

## 2019-12-22 NOTE — Progress Notes (Signed)
Subjective: CC: Nausea  Patient up walking in the halls. He reports that he is nauseated. Currently clamped and received his home meds not long ago. He denies any nausea yesterday or today before being clamp and taking home meds. He denies abdominal pain, or distension. Did start passing flatus yesterday. Has passed flatus 3-4 times today. No BM. Had 1 cup of ice in the last 24 hours.   ROS: See above, otherwise other systems negative   Objective: Vital signs in last 24 hours: Temp:  [98.1 F (36.7 C)-98.5 F (36.9 C)] 98.1 F (36.7 C) (02/15 0514) Pulse Rate:  [81-92] 92 (02/15 0702) BP: (133-176)/(79-87) 151/81 (02/15 0702) Last BM Date: (PTA)  Intake/Output from previous day: 02/14 0701 - 02/15 0700 In: 910 [P.O.:60; I.V.:850] Out: 2220 [Urine:700; Emesis/NG output:1520] Intake/Output this shift: Total I/O In: -  Out: 200 [Urine:200]  PE: Gen:  Alert, NAD, pleasant HEENT: NGT in place. 1520cc/24 hours. 200cc bile in cannister. Currently NGT is clamped. Card:  RRR Pulm:  CTAB, no W/R/R, effort normal Abd: Soft, mild distension, tenderness of the epigastrium without rebound or guarding. No peritonitis. Normoactive BS. Prior abdominal scars noted Ext:  No LE edema  Psych: A&Ox3  Skin: no rashes noted, warm and dry  Lab Results:  Recent Labs    12/20/19 1113 12/21/19 0340  WBC 7.9 10.2  HGB 18.2* 17.5*  HCT 53.4* 52.1*  PLT 144* 134*   BMET Recent Labs    12/20/19 1113 12/21/19 0340  NA 134* 135  K 4.5 4.1  CL 93* 93*  CO2 31 30  GLUCOSE 118* 130*  BUN 7* 10  CREATININE 0.97 0.98  CALCIUM 9.2 8.4*   PT/INR No results for input(s): LABPROT, INR in the last 72 hours. CMP     Component Value Date/Time   NA 135 12/21/2019 0340   K 4.1 12/21/2019 0340   CL 93 (L) 12/21/2019 0340   CO2 30 12/21/2019 0340   GLUCOSE 130 (H) 12/21/2019 0340   GLUCOSE 118 (H) 10/25/2006 1414   BUN 10 12/21/2019 0340   CREATININE 0.98 12/21/2019 0340   CREATININE  0.97 12/31/2012 1108   CALCIUM 8.4 (L) 12/21/2019 0340   CALCIUM 9.3 01/23/2012 1045   PROT 7.2 12/20/2019 1113   ALBUMIN 4.0 12/20/2019 1113   AST 20 12/20/2019 1113   ALT 13 12/20/2019 1113   ALKPHOS 75 12/20/2019 1113   BILITOT 0.7 12/20/2019 1113   GFRNONAA >60 12/21/2019 0340   GFRAA >60 12/21/2019 0340   Lipase     Component Value Date/Time   LIPASE 19 12/20/2019 1113       Studies/Results: CT ABDOMEN PELVIS W CONTRAST  Result Date: 12/20/2019 CLINICAL DATA:  Lower abdominal pain for 2 days EXAM: CT ABDOMEN AND PELVIS WITH CONTRAST TECHNIQUE: Multidetector CT imaging of the abdomen and pelvis was performed using the standard protocol following bolus administration of intravenous contrast. CONTRAST:  156mL OMNIPAQUE IOHEXOL 300 MG/ML  SOLN COMPARISON:  10/11/2017 FINDINGS: Lower chest: No acute abnormality. Hepatobiliary: No focal liver abnormality is seen. Status post cholecystectomy. No biliary dilatation. Pancreas: Unremarkable. No pancreatic ductal dilatation or surrounding inflammatory changes. Spleen: Normal in size without focal abnormality. Adrenals/Urinary Tract: Adrenal glands are within normal limits. Kidneys demonstrate a normal enhancement pattern. No renal calculi are seen. Small renal cysts are noted. No obstructive changes are seen. The bladder is partially distended. Stomach/Bowel: Scattered diverticular change of the colon is noted without evidence of diverticulitis. The appendix  is within normal limits. Multiple dilated loops of small bowel are noted. There is a transition zone in the anterior abdomen beneath areas of prior ventral hernia repair consistent with adhesions. The more distal small bowel is decompressed and within normal limits. Some mild free fluid is noted likely reactive in this area. Laxity of the anterior abdominal wall is seen without definitive herniation. Vascular/Lymphatic: Aortic atherosclerosis. No enlarged abdominal or pelvic lymph nodes.  Reproductive: Prostate is enlarged in size. Other: Laxity of the anterior abdominal wall is noted without true herniation. Mild free fluid is noted as described above. Musculoskeletal: No fracture is seen. IMPRESSION: Changes consistent with partial small bowel obstruction in the region of the proximal ileum. This is felt to be related to adhesions given the transition zone anteriorly with multiple clustered loops of small bowel in this region. Diverticulosis without diverticulitis. No other focal abnormality is noted. Electronically Signed   By: Inez Catalina M.D.   On: 12/20/2019 13:45   DG Abd Portable 1V  Result Date: 12/22/2019 CLINICAL DATA:  Small-bowel obstruction EXAM: PORTABLE ABDOMEN - 1 VIEW COMPARISON:  12/21/2019; 12/20/2019; CT abdomen pelvis-12/20/2019 FINDINGS: Redemonstrated gaseous distension of several loops of small bowel with index loop of small bowel in the left mid hemiabdomen measures 5.1 cm in diameter. There is however progressive gaseous distension of the colon, particularly the ascending and transverse colon. Nondiagnostic evaluation for pneumoperitoneum secondary to supine positioning and exclusion of the lower thorax. No pneumatosis or portal venous gas. Post cholecystectomy. Degenerative change of the lower lumbar spine and bilateral hips is suspected though incompletely evaluated. IMPRESSION: Nonspecific gaseous distension of several loops of small bowel within left mid hemiabdomen though with progressive gaseous distension of colon, findings are nonspecific though could be seen the setting of ileus versus partial/incomplete small bowel obstruction. Electronically Signed   By: Sandi Mariscal M.D.   On: 12/22/2019 07:48   DG Abd Portable 1V-Small Bowel Obstruction Protocol-initial, 8 hr delay  Result Date: 12/21/2019 CLINICAL DATA:  Small bowel obstruction. Follow-up. EXAM: PORTABLE ABDOMEN - 1 VIEW COMPARISON:  12/20/2019 FINDINGS: Stable enteric tube with tip in the body of  stomach. Changes of partial small bowel obstruction again noted with persistent dilated loops of small bowel within the left abdomen measuring up to 4.3 cm. No free air. IMPRESSION: Partial small bowel obstruction pattern, similar to previous exam. Electronically Signed   By: Kerby Moors M.D.   On: 12/21/2019 06:38   DG Abd Portable 1V-Small Bowel Protocol-Position Verification  Result Date: 12/20/2019 CLINICAL DATA:  NG tube placement. EXAM: PORTABLE ABDOMEN - 1 VIEW COMPARISON:  None. FINDINGS: Enteric tube appears appropriately positioned in the stomach. Surgical clips are seen in the RIGHT upper quadrant and LEFT upper quadrant. Mildly prominent small bowel loops noted within the central abdomen and LEFT upper quadrant, corresponding to the distended small bowel loops demonstrated on abdominal CT earlier today. IMPRESSION: Enteric tube appears appropriately positioned in the stomach. Electronically Signed   By: Franki Cabot M.D.   On: 12/20/2019 17:01    Anti-infectives: Anti-infectives (From admission, onward)   None       Assessment/Plan COVID + - asymptomatic, repeat test positive Trigeminal neuralgia -  Home meds this am during clamping trial HTN - Home meds this am during clamping trial HLD  GERD - Protonix Daily   pSBO - Multiple prior surgeries.  Appears to have a piece of small bowel kinked to his abdominal wall as transition point. - ? Contrast in colon on  xray this am. Now passing flatus. Nausea with clamping this morning. Leave on LIWS today. Hopefully clamping trials tomorrow AM.  - Am xray and labs to check electrolytes - Keep K> 4 and Mg > 2 for bowel function - Mobilize for bowel function.   FEN - NPO/NGT today.  VTE - SCDs, Lovenox ID - none   LOS: 2 days    Jillyn Ledger , [facility] 12/22/2019, 10:36 AM Please see Amion for pager number during day hours 7:00am-4:30pm

## 2019-12-23 ENCOUNTER — Inpatient Hospital Stay (HOSPITAL_COMMUNITY): Payer: Medicare Other

## 2019-12-23 LAB — BASIC METABOLIC PANEL WITH GFR
Anion gap: 12 (ref 5–15)
BUN: 12 mg/dL (ref 8–23)
CO2: 26 mmol/L (ref 22–32)
Calcium: 8 mg/dL — ABNORMAL LOW (ref 8.9–10.3)
Chloride: 100 mmol/L (ref 98–111)
Creatinine, Ser: 0.88 mg/dL (ref 0.61–1.24)
GFR calc Af Amer: >60 mL/min
GFR calc non Af Amer: >60 mL/min
Glucose, Bld: 108 mg/dL — ABNORMAL HIGH (ref 70–99)
Potassium: 3.3 mmol/L — ABNORMAL LOW (ref 3.5–5.1)
Sodium: 138 mmol/L (ref 135–145)

## 2019-12-23 LAB — MAGNESIUM: Magnesium: 1.8 mg/dL (ref 1.7–2.4)

## 2019-12-23 LAB — CBC
HCT: 49.1 % (ref 39.0–52.0)
Hemoglobin: 16.4 g/dL (ref 13.0–17.0)
MCH: 30.7 pg (ref 26.0–34.0)
MCHC: 33.4 g/dL (ref 30.0–36.0)
MCV: 91.8 fL (ref 80.0–100.0)
Platelets: 97 K/uL — ABNORMAL LOW (ref 150–400)
RBC: 5.35 MIL/uL (ref 4.22–5.81)
RDW: 13.2 % (ref 11.5–15.5)
WBC: 5.7 K/uL (ref 4.0–10.5)
nRBC: 0 % (ref 0.0–0.2)

## 2019-12-23 MED ORDER — POTASSIUM CHLORIDE CRYS ER 10 MEQ PO TBCR
10.0000 meq | EXTENDED_RELEASE_TABLET | Freq: Three times a day (TID) | ORAL | Status: DC
  Administered 2019-12-23 – 2019-12-24 (×3): 10 meq via ORAL
  Filled 2019-12-23 (×3): qty 1

## 2019-12-23 MED ORDER — POTASSIUM CHLORIDE 10 MEQ/100ML IV SOLN
10.0000 meq | INTRAVENOUS | Status: DC
  Administered 2019-12-23: 10 meq via INTRAVENOUS
  Filled 2019-12-23 (×2): qty 100

## 2019-12-23 NOTE — Progress Notes (Addendum)
Subjective: CC: SBO Patient reports that his nausea resolved yesterday morning and he has not had any further episodes of nausea (however, did get zofran this am at 1019). He denies any abdominal pain or distension. He is passing flatus and had a small, watery bm yesterday afternoon. He mobilized x 1 yesterday in the halls. No I/O's recorded yesterday for NGT output. He does have ~ 400cc bilious content in his cannister.   ROS: See above, otherwise other systems negative   Objective: Vital signs in last 24 hours: Temp:  [99.8 F (37.7 C)] 99.8 F (37.7 C) (02/15 1948) Pulse Rate:  [105] 105 (02/15 1948) BP: (156)/(88) 156/88 (02/15 1948) SpO2:  [93 %] 93 % (02/15 1948) Last BM Date: (PTA)  Intake/Output from previous day: 02/15 0701 - 02/16 0700 In: 120 [NG/GT:120] Out: 720 [Urine:720] Intake/Output this shift: No intake/output data recorded.  PE: Gen:  Alert, NAD, pleasant HEENT: NGT in place. No output recorded in I/O's. 400cc bilious content in cannister Card:  RRR Pulm:  CTAB, no W/R/R, effort normal Abd: Soft, NT/ND, +BS, prior abdominal scars noted  Ext:  No LE edema  Psych: A&Ox3  Skin: no rashes noted, warm and dry   Lab Results:  Recent Labs    12/21/19 0340 12/23/19 0409  WBC 10.2 5.7  HGB 17.5* 16.4  HCT 52.1* 49.1  PLT 134* 97*   BMET Recent Labs    12/21/19 0340 12/23/19 0409  NA 135 138  K 4.1 3.3*  CL 93* 100  CO2 30 26  GLUCOSE 130* 108*  BUN 10 12  CREATININE 0.98 0.88  CALCIUM 8.4* 8.0*   PT/INR No results for input(s): LABPROT, INR in the last 72 hours. CMP     Component Value Date/Time   NA 138 12/23/2019 0409   K 3.3 (L) 12/23/2019 0409   CL 100 12/23/2019 0409   CO2 26 12/23/2019 0409   GLUCOSE 108 (H) 12/23/2019 0409   GLUCOSE 118 (H) 10/25/2006 1414   BUN 12 12/23/2019 0409   CREATININE 0.88 12/23/2019 0409   CREATININE 0.97 12/31/2012 1108   CALCIUM 8.0 (L) 12/23/2019 0409   CALCIUM 9.3 01/23/2012 1045   PROT  7.2 12/20/2019 1113   ALBUMIN 4.0 12/20/2019 1113   AST 20 12/20/2019 1113   ALT 13 12/20/2019 1113   ALKPHOS 75 12/20/2019 1113   BILITOT 0.7 12/20/2019 1113   GFRNONAA >60 12/23/2019 0409   GFRAA >60 12/23/2019 0409   Lipase     Component Value Date/Time   LIPASE 19 12/20/2019 1113       Studies/Results: DG Abd Portable 1V  Result Date: 12/23/2019 CLINICAL DATA:  Small bowel obstruction EXAM: PORTABLE ABDOMEN - 1 VIEW COMPARISON:  12/22/2019 FINDINGS: NG tube is in the stomach. Dilated bowel loops in the abdomen include dominantly small bowel, but some prominent colon noted in the left lower quadrant. Bowel gas pattern not significantly changed since prior study. Oral contrast material seen within the colon. Prior cholecystectomy. IMPRESSION: Prominent small and large bowel within the abdomen and pelvis, not significantly changed. NG tube is in the stomach. Electronically Signed   By: Rolm Baptise M.D.   On: 12/23/2019 08:53   DG Abd Portable 1V  Result Date: 12/22/2019 CLINICAL DATA:  Small-bowel obstruction EXAM: PORTABLE ABDOMEN - 1 VIEW COMPARISON:  12/21/2019; 12/20/2019; CT abdomen pelvis-12/20/2019 FINDINGS: Redemonstrated gaseous distension of several loops of small bowel with index loop of small bowel in the left mid hemiabdomen  measures 5.1 cm in diameter. There is however progressive gaseous distension of the colon, particularly the ascending and transverse colon. Nondiagnostic evaluation for pneumoperitoneum secondary to supine positioning and exclusion of the lower thorax. No pneumatosis or portal venous gas. Post cholecystectomy. Degenerative change of the lower lumbar spine and bilateral hips is suspected though incompletely evaluated. IMPRESSION: Nonspecific gaseous distension of several loops of small bowel within left mid hemiabdomen though with progressive gaseous distension of colon, findings are nonspecific though could be seen the setting of ileus versus  partial/incomplete small bowel obstruction. Electronically Signed   By: Sandi Mariscal M.D.   On: 12/22/2019 07:48    Anti-infectives: Anti-infectives (From admission, onward)   None       Assessment/Plan COVID + - asymptomatic, repeat test positive Trigeminal neuralgia -  Home meds  HTN - Home meds  HLD - No meds on file  GERD - Protonix Daily   pSBO - Multiple prior surgeries. Appears to have a piece of small bowel kinked to his abdominal wall as transition point. - Clamping trial today. Possible NGT removal later today. Allow sips of clears.  - Keep K> 4 and Mg > 2 for bowel function - Mobilize for bowel function  FEN -Sips of clears from the floor. Clamp NGT. Possible removal later today. Replace K. IVF.  VTE -SCDs, Lovenox ID -None POC - I called and updated the patients wife yesterday    LOS: 3 days    Jillyn Ledger , [facility] 12/23/2019, 10:33 AM Please see Amion for pager number during day hours 7:00am-4:30pm

## 2019-12-23 NOTE — Progress Notes (Signed)
CC: Abdominal pain  Subjective: NG is out no nausea with clears some nausea with potassium replacement.  No BM.  Patient says he has not been ambulating is very hard to get assistance with this. Objective: Vital signs in last 24 hours: Temp:  [99.8 F (37.7 C)] 99.8 F (37.7 C) (02/15 1948) Pulse Rate:  [105] 105 (02/15 1948) BP: (156)/(88) 156/88 (02/15 1948) SpO2:  [93 %] 93 % (02/15 1948) Last BM Date: (PTA) Nothing p.o. recorded 120 from NG recorded as intake Urine 720 No NG output or BM recorded. Vital signs x3 yesterday shows blood pressure mildly elevated.  T-max 99.8. K+ 3.3, magnesium 1.8 her electrolytes are normal. WBC 5.7 H/H 16.4/49.1 Platelets 97,000 AXR: NG tube in the stomach dilated bowel loops in the abdomen including small bowel and some prominent colon noted in the left lower quadrant bowel gas pattern is not significantly changed from yesterday. Intake/Output from previous day: 02/15 0701 - 02/16 0700 In: 120 [NG/GT:120] Out: 720 [Urine:720] Intake/Output this shift: No intake/output data recorded.  General appearance: alert, cooperative and no distress Resp: Rales both bases. Cardio: Regular rate and rhythm GI: Abdomen soft, he has a large abdomen with multiple abdominal scars.  Bowel sounds are somewhat diminished, but I am using the stock stethoscope with a cap her on.  He is not tender at all.  Some flatus no BM since Monday. Extremities: extremities normal, atraumatic, no cyanosis or edema  Lab Results:  Recent Labs    12/21/19 0340 12/23/19 0409  WBC 10.2 5.7  HGB 17.5* 16.4  HCT 52.1* 49.1  PLT 134* 97*    BMET Recent Labs    12/21/19 0340 12/23/19 0409  NA 135 138  K 4.1 3.3*  CL 93* 100  CO2 30 26  GLUCOSE 130* 108*  BUN 10 12  CREATININE 0.98 0.88  CALCIUM 8.4* 8.0*   PT/INR No results for input(s): LABPROT, INR in the last 72 hours.  Recent Labs  Lab 12/20/19 1113  AST 20  ALT 13  ALKPHOS 75  BILITOT 0.7   PROT 7.2  ALBUMIN 4.0     Lipase     Component Value Date/Time   LIPASE 19 12/20/2019 1113     Medications: . carbamazepine  100 mg Oral TID  . enoxaparin (LOVENOX) injection  40 mg Subcutaneous Q24H  . gabapentin  400 mg Oral QID  . hydrochlorothiazide  12.5 mg Oral Daily  . losartan  50 mg Oral Daily  . pantoprazole (PROTONIX) IV  40 mg Intravenous Daily   . dextrose 5 % and 0.9% NaCl 75 mL/hr at 12/23/19 0153    Assessment/Plan COVID + - asymptomatic, Positive x 2 - 2/13 & 2/14 Trigeminal neuralgia -  Home meds this am during clamping trial HTN - Home meds this am during clamping trial HLD  GERD - Protonix Daily  Dehydration - H/H: 18.2/53.4>>17.5/52.1 >>16.4/49.1 Thrombocytopenia - platelets 97K  pSBO - Multiple prior surgeries. Appears to have a piece of small bowel kinked to his abdominal wall as transition point. - ? Contrast in colon on xray this am. Now passing flatus. Nausea with clamping this morning. Leave on LIWS today. Hopefully clamping trials tomorrow AM.  - Am xray and labs to check electrolytes - Keep K> 4 and Mg > 2 for bowel function - Mobilize for bowel function.   FEN -NPO/NGT today.  VTE -SCDs, Lovenox ID -none  Plan: I want to continue working on mobilization.  We will give him  a Dulcolax suppository this a.m. to see if that helps with additional bowel movements.  I will leave him on clears for right now.  I spoke to the staff about ambulating the patient.  He appears asymptomatic from the Covid but ongoing get a chest x-ray based on some [place].  Continue potassium replacement.      LOS: 3 days    Sanja Elizardo 12/23/2019 Please see Amion

## 2019-12-23 NOTE — Telephone Encounter (Signed)
Called back. Ms. Krisko answers and the phone then disconnects. Called her back. No answer. Left her a message on the voicemail that I am returning her call.

## 2019-12-24 ENCOUNTER — Inpatient Hospital Stay (HOSPITAL_COMMUNITY): Payer: Medicare Other

## 2019-12-24 ENCOUNTER — Encounter (HOSPITAL_COMMUNITY): Payer: Self-pay

## 2019-12-24 DIAGNOSIS — U071 COVID-19: Secondary | ICD-10-CM | POA: Diagnosis present

## 2019-12-24 LAB — FIBRINOGEN: Fibrinogen: 421 mg/dL (ref 210–475)

## 2019-12-24 LAB — C-REACTIVE PROTEIN: CRP: 1.6 mg/dL — ABNORMAL HIGH (ref <1.0)

## 2019-12-24 LAB — PROCALCITONIN: Procalcitonin: <0.1 ng/mL

## 2019-12-24 LAB — BASIC METABOLIC PANEL
Anion gap: 11 (ref 5–15)
BUN: 14 mg/dL (ref 8–23)
CO2: 26 mmol/L (ref 22–32)
Calcium: 8.4 mg/dL — ABNORMAL LOW (ref 8.9–10.3)
Chloride: 98 mmol/L (ref 98–111)
Creatinine, Ser: 0.79 mg/dL (ref 0.61–1.24)
GFR calc Af Amer: >60 mL/min (ref >60)
GFR calc non Af Amer: >60 mL/min (ref >60)
Glucose, Bld: 113 mg/dL — ABNORMAL HIGH (ref 70–99)
Potassium: 3.3 mmol/L — ABNORMAL LOW (ref 3.5–5.1)
Sodium: 135 mmol/L (ref 135–145)

## 2019-12-24 LAB — LACTATE DEHYDROGENASE: LDH: 253 U/L — ABNORMAL HIGH (ref 98–192)

## 2019-12-24 LAB — MAGNESIUM: Magnesium: 1.8 mg/dL (ref 1.7–2.4)

## 2019-12-24 LAB — FERRITIN: Ferritin: 403 ng/mL — ABNORMAL HIGH (ref 24–336)

## 2019-12-24 LAB — D-DIMER, QUANTITATIVE: D-Dimer, Quant: 0.97 ug/mL-FEU — ABNORMAL HIGH (ref 0.00–0.50)

## 2019-12-24 MED ORDER — SODIUM CHLORIDE 0.9 % IV SOLN
100.0000 mg | Freq: Every day | INTRAVENOUS | Status: DC
  Administered 2019-12-25 – 2019-12-27 (×3): 100 mg via INTRAVENOUS
  Filled 2019-12-24 (×3): qty 20

## 2019-12-24 MED ORDER — ALUM & MAG HYDROXIDE-SIMETH 200-200-20 MG/5ML PO SUSP
15.0000 mL | ORAL | Status: DC | PRN
  Administered 2019-12-24 – 2019-12-26 (×5): 15 mL via ORAL
  Filled 2019-12-24 (×5): qty 30

## 2019-12-24 MED ORDER — SODIUM CHLORIDE 0.9 % IV SOLN
200.0000 mg | Freq: Once | INTRAVENOUS | Status: AC
  Administered 2019-12-24: 200 mg via INTRAVENOUS
  Filled 2019-12-24: qty 200

## 2019-12-24 MED ORDER — KCL IN DEXTROSE-NACL 40-5-0.9 MEQ/L-%-% IV SOLN
INTRAVENOUS | Status: DC
  Filled 2019-12-24 (×4): qty 1000

## 2019-12-24 MED ORDER — POTASSIUM CHLORIDE CRYS ER 10 MEQ PO TBCR
10.0000 meq | EXTENDED_RELEASE_TABLET | Freq: Four times a day (QID) | ORAL | Status: DC
  Administered 2019-12-24: 10 meq via ORAL
  Filled 2019-12-24: qty 1

## 2019-12-24 MED ORDER — BISACODYL 10 MG RE SUPP
10.0000 mg | Freq: Once | RECTAL | Status: DC
  Filled 2019-12-24: qty 1

## 2019-12-24 NOTE — Consult Note (Signed)
Medical Consultation   [name]  [id number]  DOB: Dec 03, 1950  DOA: 12/20/2019  PCP: [name], MD   Outpatient Specialists: Vernon Prey -urology; Perrie.Foyer - cardiology   Requesting physician: Rosendo Gros - surgery  Reason for consultation: SBO - improving.  But had rales in bases, CXR with basilar PNA   History of Present Illness: [name] is an 69 y.o. male with h/o COPD; IBS; HTN; HLD; anxiety/depression; pernicious anemia; and h/o Nissen fundoplication with multiple subsequent repairs and known slipped Nissen who presented on 2/13 with partial SBO from adhesions.  He was found to be COVID + but thought to be asymptomatic.  He reports a long h/o GI issues.  He came in for abdominal issues.  He denies respiratory issues.  He was remotely intubated and subsequently saw pulmonology in f/u and was told he has scarring in the bases of his lungs.  He is baffled about the COVID since no one else in his bubble has it and he has been asymptomatic from this standpoint.    Review of Systems:  ROS As per HPI otherwise 10 point review of systems negative.    Past Medical History: Past Medical History:  Diagnosis Date  . Allergic rhinitis   . Anemia, pernicious   . Anxiety and depression    sees Dr. [name]  . Arthritis   . Catheter-associated urinary tract infection ([place])   . Diverticulosis of colon   . Elevated PSA    and hypogonadism, sees urologist  . GERD (gastroesophageal reflux disease)    hiatal hernia  . Hx of blood transfusion reaction 1999  . Hyperlipidemia   . Hyperparathyroidism ([place])   . Hyperplastic colon polyp   . Hypertension    acei causes cough  . IBS (irritable bowel syndrome)   . Nephrolithiasis   . Obstructive chronic bronchitis without exacerbation, followed by Dr. [name] 03/04/2009   ONO RA  Normal 07/28/11 6 min walk >536m no desat PFTs 08/08/11: DLCO 70% TLC 80%  FeV1 80%   Fef 25 75 56% with significant  improvement after BD   . Peritonitis ([place])    after surgery in 1999  . Pneumonia   . PONV (postoperative nausea and vomiting)   . Rectal fissure   . Trigeminal neuralgia    has facial asymetry  . Ventral hernia     Past Surgical History: Past Surgical History:  Procedure Laterality Date  . ABDOMINAL SURGERY     Multiple  . BRAIN SURGERY  01/2011   Trigeminal nerve/Dr Vertell Limber  . CHOLECYSTECTOMY  1981  . DENTAL SURGERY  2019   tooth extraction, root canal  . HAND SURGERY  1973   Left  . HERNIA REPAIR  1999  . KNEE SURGERY  1997   Left  . LITHOTRIPSY     Right  . NISSEN FUNDOPLICATION  [id number]    complicated by gastric perforation, peritonitis, ventral nernia   . RHIZOTOMY Right 11/17/2014   Procedure: RHIZOTOMY TO RIGHT V2,V3;  Surgeon: Erline Levine, MD;  Location: [facility];  Service: Neurosurgery;  Laterality: Right;  right  . RHIZOTOMY Right 12/22/2014   Procedure: Right V2 and V3 trigeminal rhysolysis;  Surgeon: Erline Levine, MD;  Location: [facility];  Service: Neurosurgery;  Laterality: Right;  Right V2 and V3 trigeminal rhysolysis  . RHIZOTOMY W/ RADIOFREQUENCY ABLATION  08/2017   Dr [name]  . UPPER  GASTROINTESTINAL ENDOSCOPY    . VENTRAL HERNIA REPAIR  2004     Allergies:   Allergies  Allergen Reactions  . Dilantin [Phenytoin]     bradycardia  . Dilaudid [Hydromorphone Hcl]   . Ace Inhibitors Cough  . Augmentin [Amoxicillin-Pot Clavulanate] Nausea And Vomiting  . Codeine Nausea And Vomiting  . Sulfa Antibiotics     hives     Social History:  reports that he quit smoking about 23 years ago. His smoking use included cigarettes. He has a 20.00 pack-year smoking history. He quit smokeless tobacco use about 36 years ago.  His smokeless tobacco use included chew. He reports that he does not drink alcohol or use drugs.   Family History: Family History  Problem Relation Age of Onset  . Diabetes Mother   . Coronary artery disease Mother        CABG  .  Diabetes Sister   . Diabetes Brother   . Hypertension Brother   . Heart disease Brother   . Heart disease Brother   . Colon cancer Neg Hx   . Esophageal cancer Neg Hx   . Stomach cancer Neg Hx       Physical Exam: Vitals:   12/23/19 1423 12/23/19 2223 12/24/19 0528 12/24/19 1307  BP: (!) 141/85 (!) 141/82 116/74 117/83  Pulse: 85 85 82 82  Resp: 18 20 18 18   Temp: 98.6 F (37 C) 99.1 F (37.3 C) 98.4 F (36.9 C) 97.9 F (36.6 C)  TempSrc: Oral Oral Oral Oral  SpO2: 96% 96% 94% 97%    Constitutional: Alert and awake, oriented x3, not in any acute distress. Eyes: EOMI, irises appear normal, anicteric sclera,  ENMT: external ears and nose appear normal, normal hearing, Lips appear normal Neck: neck appears normal, no masses, normal ROM, no thyromegaly, no JVD  CVS: S1-S2 clear, no murmur rubs or gallops, no LE edema, normal pedal pulses  Respiratory:  clear to auscultation bilaterally, no wheezing, rales or rhonchi. Respiratory effort normal. No accessory muscle use.  Abdomen: soft mildly diffusely tender, nondistended Musculoskeletal: : no cyanosis, clubbing or edema noted bilaterally Neuro: Cranial nerves II-XII intact, strength, sensation, reflexes Psych: judgement and insight appear normal, stable mood and affect, mental status Skin: no rashes or lesions or ulcers, no induration or nodules    Data reviewed:  I have personally reviewed the recent labs and imaging studies  Pertinent Labs:   K+ 3.3 Glucose 113 LDH 253 Ferritin 403 Lactate 1.6 Procalcitonin <0.10 D-dimer 0.97 Fibrinogen 421   Inpatient Medications:   Scheduled Meds: . bisacodyl  10 mg Rectal Once  . carbamazepine  100 mg Oral TID  . enoxaparin (LOVENOX) injection  40 mg Subcutaneous Q24H  . gabapentin  400 mg Oral QID  . hydrochlorothiazide  12.5 mg Oral Daily  . losartan  50 mg Oral Daily  . pantoprazole (PROTONIX) IV  40 mg Intravenous Daily   Continuous Infusions: . dextrose 5 % and  0.9 % NaCl with KCl 40 mEq/L       Radiological Exams on Admission: DG Chest Port 1 View  Result Date: 12/24/2019 CLINICAL DATA:  COVID-19 positive patient. EXAM: PORTABLE CHEST 1 VIEW COMPARISON:  PA and lateral chest 11/09/2017. FINDINGS: There is left basilar airspace disease consistent with pneumonia. The right lung is clear. Heart size is normal. No pneumothorax or pleural effusion. IMPRESSION: Left basilar pneumonia. Electronically Signed   By: Inge Rise M.D.   On: 12/24/2019 11:49   DG Abd Portable  1V  Result Date: 12/23/2019 CLINICAL DATA:  Small bowel obstruction EXAM: PORTABLE ABDOMEN - 1 VIEW COMPARISON:  12/22/2019 FINDINGS: NG tube is in the stomach. Dilated bowel loops in the abdomen include dominantly small bowel, but some prominent colon noted in the left lower quadrant. Bowel gas pattern not significantly changed since prior study. Oral contrast material seen within the colon. Prior cholecystectomy. IMPRESSION: Prominent small and large bowel within the abdomen and pelvis, not significantly changed. NG tube is in the stomach. Electronically Signed   By: Rolm Baptise M.D.   On: 12/23/2019 08:53    Impression/Recommendations Principal Problem:   SBO (small bowel obstruction) (HCC) Active Problems:   Essential hypertension   Obstructive chronic bronchitis without exacerbation, followed by Dr. [name]   Anxiety and depression, followed by Dr. [name] in Psych   COVID-19 virus infection  SBO -h/o slipped Nissen, multiple surgeries associated with complications -Presented with partial SBO -He was advanced to liquids today and had n/v after eating -Ongoing management per surgery  COVID PNA with h/o COPD -Patient was screened and found to have COVID (tested twice since wife was convinced it was a false +) -He denies symptoms, but review of CXR compared to 11/09/17 does appear to show LLL infiltrate -Negative procalcitonin -Inflammatory markers are mildly increased but CRP  is normal -Will hold steroids but will start remdesivir -RA with no hypoxia, appears comfortable  Anxiety/depression -Continue Xanax, Tegretol  HTN -Continue Losartan-HCTZ    *I called and spoke with his wife today.  Their neighbor is 65yo and had CHF and were around him on 2/7.  They were tested on 2/9 and were negative.  She is really frustrated and confused.   Thank you for this consultation.  Our [facility] hospitalist team will follow the patient with you.   Time Spent: 60 minutes  Karmen Bongo M.D. [facility] 12/24/2019, 4:38 PM

## 2019-12-24 NOTE — Progress Notes (Signed)
RN called and updated [name] and she is requesting the physician to call with an update. States no call from MD about his blockage since Monday.

## 2019-12-24 NOTE — Progress Notes (Signed)
I called his wife to update her on his current progress.  She still does not believe he has Covid.  She was upset that he only had 1 back since Monday and that they were not walking him. Since I saw him earlier this morning he has had another large bowel movement. Portable chest x-ray shows left basilar airspace disease consistent with pneumonia the right lung was reported clear. Medicine consult has been requested to treat his Covid/pneumonia.  I also informed his wife of this finding and that medicine was going to see him about the reported pneumonia.

## 2019-12-24 NOTE — Progress Notes (Signed)
CC: Abdominal pain   Subjective: NG is out no nausea with clears some nausea with potassium replacement.  No BM.  Patient says he has not been ambulating is very hard to get assistance with this. Objective: Vital signs in last 24 hours: Temp:  [99.8 F (37.7 C)] 99.8 F (37.7 C) (02/15 1948) Pulse Rate:  [105] 105 (02/15 1948) BP: (156)/(88) 156/88 (02/15 1948) SpO2:  [93 %] 93 % (02/15 1948) Last BM Date: (PTA) Nothing p.o. recorded 120 from NG recorded as intake Urine 720 No NG output or BM recorded. Vital signs x3 yesterday shows blood pressure mildly elevated.  T-max 99.8. K+ 3.3, magnesium 1.8 her electrolytes are normal. WBC 5.7 H/H 16.4/49.1 Platelets 97,000 AXR: NG tube in the stomach dilated bowel loops in the abdomen including small bowel and some prominent colon noted in the left lower quadrant bowel gas pattern is not significantly changed from yesterday. Intake/Output from previous day: 02/15 0701 - 02/16 0700 In: 120 [NG/GT:120] Out: 720 [Urine:720] Intake/Output this shift: No intake/output data recorded.   General appearance: alert, cooperative and no distress Resp: Rales both bases. Cardio: Regular rate and rhythm GI: Abdomen soft, he has a large abdomen with multiple abdominal scars.  Bowel sounds are somewhat diminished, but I am using the stock stethoscope with a cap her on.  He is not tender at all.  Some flatus no BM since Monday. Extremities: extremities normal, atraumatic, no cyanosis or edema   Lab Results:  Recent Labs (last 2 labs)       Recent Labs    12/21/19 0340 12/23/19 0409  WBC 10.2 5.7  HGB 17.5* 16.4  HCT 52.1* 49.1  PLT 134* 97*        BMET Recent Labs (last 2 labs)       Recent Labs    12/21/19 0340 12/23/19 0409  NA 135 138  K 4.1 3.3*  CL 93* 100  CO2 30 26  GLUCOSE 130* 108*  BUN 10 12  CREATININE 0.98 0.88  CALCIUM 8.4* 8.0*      PT/INR Recent Labs (last 2 labs)   No results for input(s): LABPROT, INR in  the last 72 hours.     Last Labs      Recent Labs  Lab 12/20/19 1113  AST 20  ALT 13  ALKPHOS 75  BILITOT 0.7  PROT 7.2  ALBUMIN 4.0        Lipase  Labs (Brief)          Component Value Date/Time    LIPASE 19 12/20/2019 1113        Medications: . carbamazepine  100 mg Oral TID  . enoxaparin (LOVENOX) injection  40 mg Subcutaneous Q24H  . gabapentin  400 mg Oral QID  . hydrochlorothiazide  12.5 mg Oral Daily  . losartan  50 mg Oral Daily  . pantoprazole (PROTONIX) IV  40 mg Intravenous Daily    . dextrose 5 % and 0.9% NaCl 75 mL/hr at 12/23/19 0153      Assessment/Plan COVID + - asymptomatic, Positive x 2 - 2/13 & 2/14 Trigeminal neuralgia -  Home meds this am during clamping trial HTN - Home meds this am during clamping trial HLD  GERD - Protonix Daily  Dehydration - H/H: 18.2/53.4>>17.5/52.1 >>16.4/49.1 Thrombocytopenia - platelets 97K Hypokalemia - K+ being replaced K+ 4.0 or greater goal   pSBO - Multiple prior surgeries.  Appears to have a piece of small bowel kinked to his abdominal wall as transition  point. - ? Contrast in colon on xray this am. Now passing flatus. Nausea with clamping this morning. Leave on LIWS today. Hopefully clamping trials tomorrow AM.  - Am xray and labs to check electrolytes - Keep K> 4 and Mg > 2 for bowel function - Mobilize for bowel function.    FEN - NPO/NGT today.  VTE - SCDs, Lovenox ID - none   Plan: I want to continue working on mobilization.  We will give him a Dulcolax suppository this a.m. to see if that helps with additional bowel movements.  I will leave him on clears for right now.  I spoke to the staff about ambulating the patient.  He appears asymptomatic from the Covid but ongoing get a chest x-ray based on some rales I heard in the base.  Continue potassium replacement.      LOS: 3 days      Loveah Like 12/24/2019 Please see Amion

## 2019-12-24 NOTE — Progress Notes (Signed)
Patient and wife anxious about patient's nausea and sbo. Dr. Lorin Mercy and [facility] notified multiple times of refusal of dulcolax, potassium po, refusal of ambulation, and request for IV potassium and an update from MD. Now patient is requesting antacid. Dr. Lorin Mercy notified and states she will address all issues shortly. Will continue to monitor.

## 2019-12-24 NOTE — Progress Notes (Addendum)
Patient ambulated around the nurses station and hallways multiple times unassisted on room air for 30 mins. Tolerated well. First dose of Remdesivir given. D5NS with 40Kcl also started. Maalox given for c/o indigestion. Patient sitting up in chair.

## 2019-12-25 DIAGNOSIS — K56609 Unspecified intestinal obstruction, unspecified as to partial versus complete obstruction: Secondary | ICD-10-CM

## 2019-12-25 LAB — CBC
HCT: 45.1 % (ref 39.0–52.0)
Hemoglobin: 15.5 g/dL (ref 13.0–17.0)
MCH: 30.8 pg (ref 26.0–34.0)
MCHC: 34.4 g/dL (ref 30.0–36.0)
MCV: 89.5 fL (ref 80.0–100.0)
Platelets: 85 10*3/uL — ABNORMAL LOW (ref 150–400)
RBC: 5.04 MIL/uL (ref 4.22–5.81)
RDW: 13.1 % (ref 11.5–15.5)
WBC: 5.4 10*3/uL (ref 4.0–10.5)
nRBC: 0 % (ref 0.0–0.2)

## 2019-12-25 LAB — BASIC METABOLIC PANEL
Anion gap: 11 (ref 5–15)
BUN: 13 mg/dL (ref 8–23)
CO2: 26 mmol/L (ref 22–32)
Calcium: 8 mg/dL — ABNORMAL LOW (ref 8.9–10.3)
Chloride: 100 mmol/L (ref 98–111)
Creatinine, Ser: 0.73 mg/dL (ref 0.61–1.24)
GFR calc Af Amer: >60 mL/min (ref >60)
GFR calc non Af Amer: >60 mL/min (ref >60)
Glucose, Bld: 108 mg/dL — ABNORMAL HIGH (ref 70–99)
Potassium: 3.8 mmol/L (ref 3.5–5.1)
Sodium: 137 mmol/L (ref 135–145)

## 2019-12-25 LAB — MAGNESIUM: Magnesium: 1.7 mg/dL (ref 1.7–2.4)

## 2019-12-25 MED ORDER — SUCRALFATE 1 GM/10ML PO SUSP
1.0000 g | Freq: Three times a day (TID) | ORAL | Status: DC
  Administered 2019-12-25 – 2019-12-27 (×7): 1 g via ORAL
  Filled 2019-12-25 (×7): qty 10

## 2019-12-25 NOTE — Progress Notes (Signed)
Consult -   PROGRESS NOTE                                                                                                                                                                                                             Patient Demographics:    [name], is a 69 y.o. male, DOB - 1951-07-05, YPP:[id number]  Outpatient Primary MD for the patient is [name], MD    LOS - 5  Admit date - 12/20/2019    Chief Complaint  Patient presents with  . Abdominal Pain       Brief Narrative - [name] is an 69 y.o. male with h/o COPD; IBS; HTN; HLD; anxiety/depression; pernicious anemia; and h/o Nissen fundoplication with multiple subsequent repairs and known slipped Nissen who presented on 2/13 with partial SBO from adhesions.  He was found to be COVID + but thought to be asymptomatic.  He reports a long h/o GI issues.  He came in for abdominal issues.  He denies respiratory issues.  He was remotely intubated and subsequently saw pulmonology in f/u and was told he has scarring in the bases of his lungs.  He is baffled about the COVID since no one else in his bubble has it and he has been asymptomatic from this standpoint.   Subjective:    Britton Klingel today has, No headache, No chest pain, No abdominal pain - No Nausea, No new weakness tingling or numbness, no SOB.   Assessment  & Plan :      1.   Acute Covid 19 Viral Pneumonitis during the ongoing 2020 Covid 19 Pandemic - he has mild disease at best, currently getting remdesivir, since it is started we can continue, offered him outpatient infusion but he refused as he lives far away.  Will monitor inflammatory markers, currently not on steroids and in no respiratory distress or oxygen demand hence will monitor.  Encouraged the patient to sit up in chair in the daytime use I-S and flutter valve for pulmonary toiletry and then prone in bed when at  night.  SpO2: 97 %  Recent Labs  Lab 12/20/19 1629 12/21/19 1111 12/24/19 1433 12/24/19 1457  CRP  --   --  1.6*  --   DDIMER  --   --  0.97*  --   FERRITIN  --   --  403*  --  PROCALCITON  --   --   --  <0.10  SARSCOV2NAA POSITIVE* POSITIVE*  --   --     Hepatic Function Latest Ref Rng & Units 12/20/2019 06/02/2019 01/24/2018  Total Protein 6.5 - 8.1 g/dL 7.2 6.6 7.2  Albumin 3.5 - 5.0 g/dL 4.0 4.1 4.7  AST 15 - 41 U/L 20 13 18   ALT 0 - 44 U/L 13 10 19   Alk Phosphatase 38 - 126 U/L 75 72 67  Total Bilirubin 0.3 - 1.2 mg/dL 0.7 0.6 0.7  Bilirubin, Direct 0.0 - 0.3 mg/dL - 0.1 0.2    2.  SBO.  Treatment per primary team which is general surgery.  Can DC IV fluids if oral intake is better.  3.  Hypertension.  On combination of losartan HCTZ which will be continued.  4.  Anxiety.  Continue home meds.    Condition - Extremely Guarded  Family Communication  :  none  Code Status :  Full  Diet :   Diet Order            Diet clear liquid Room service appropriate? Yes; Fluid consistency: Thin  Diet effective now               Disposition Plan  : Per primary team which is general surgery  Consults  : Hospitalist consulting for general surgery  Procedures  :    PUD Prophylaxis :    DVT Prophylaxis  :  Lovenox    Lab Results  Component Value Date   PLT 85 (L) 12/25/2019    Inpatient Medications  Scheduled Meds: . bisacodyl  10 mg Rectal Once  . carbamazepine  100 mg Oral TID  . enoxaparin (LOVENOX) injection  40 mg Subcutaneous Q24H  . gabapentin  400 mg Oral QID  . hydrochlorothiazide  12.5 mg Oral Daily  . losartan  50 mg Oral Daily  . pantoprazole (PROTONIX) IV  40 mg Intravenous Daily   Continuous Infusions: . dextrose 5 % and 0.9 % NaCl with KCl 40 mEq/L 100 mL/hr at 12/25/19 0421  . remdesivir 100 mg in NS 100 mL 100 mg (12/25/19 0814)   PRN Meds:.ALPRAZolam, alum & mag hydroxide-simeth, metoprolol tartrate, morphine injection, ondansetron  **OR** ondansetron (ZOFRAN) IV, phenol, promethazine  Antibiotics  :    Anti-infectives (From admission, onward)   Start     Dose/Rate Route Frequency Ordered Stop   12/25/19 1000  remdesivir 100 mg in sodium chloride 0.9 % 100 mL IVPB     100 mg 200 mL/hr over 30 Minutes Intravenous Daily 12/24/19 1649 12/29/19 0959   12/24/19 1700  remdesivir 200 mg in sodium chloride 0.9% 250 mL IVPB     200 mg 580 mL/hr over 30 Minutes Intravenous Once 12/24/19 1649 12/24/19 1750       Time Spent in minutes  30   Lala Lund M.D on 12/25/2019 at 10:38 AM  To page go to [email or web address] - password [facility]  [facility] -  Office  [phone]  See all Orders from today for further details    Objective:   Vitals:   12/24/19 0528 12/24/19 1307 12/24/19 2226 12/25/19 0648  BP: 116/74 117/83 129/82 132/82  Pulse: 82 82 80 80  Resp: 18 18 20 20   Temp: 98.4 F (36.9 C) 97.9 F (36.6 C) 98.1 F (36.7 C) 99.1 F (37.3 C)  TempSrc: Oral Oral Oral Oral  SpO2: 94% 97% 93% 97%    Wt Readings from Last 3 Encounters:  01/02/19 103.3 kg  12/17/18 102.1 kg  07/16/18 97.7 kg     Intake/Output Summary (Last 24 hours) at 12/25/2019 1038 Last data filed at 12/25/2019 0610 Gross per 24 hour  Intake 1685.65 ml  Output 900 ml  Net 785.65 ml     Physical Exam  Awake Alert, No new F.N deficits, Normal affect [place].AT,PERRAL Supple Neck,No JVD, No cervical lymphadenopathy appriciated.  Symmetrical Chest wall movement, Good air movement bilaterally, CTAB RRR,No Gallops,Rubs or new Murmurs, No Parasternal Heave +ve B.Sounds, Abd Soft, No tenderness, No organomegaly appriciated, No rebound - guarding or rigidity. No Cyanosis, Clubbing or edema, No new Rash or bruise       Data Review:    CBC Recent Labs  Lab 12/20/19 1113 12/21/19 0340 12/23/19 0409 12/25/19 0500  WBC 7.9 10.2 5.7 5.4  HGB 18.2* 17.5* 16.4 15.5  HCT 53.4* 52.1* 49.1 45.1  PLT 144* 134* 97* 85*  MCV 89.6 90.9  91.8 89.5  MCH 30.5 30.5 30.7 30.8  MCHC 34.1 33.6 33.4 34.4  RDW 12.6 13.0 13.2 13.1    Chemistries  Recent Labs  Lab 12/20/19 1113 12/21/19 0340 12/23/19 0409 12/24/19 0526 12/25/19 0500  NA 134* 135 138 135 137  K 4.5 4.1 3.3* 3.3* 3.8  CL 93* 93* 100 98 100  CO2 31 30 26 26 26   GLUCOSE 118* 130* 108* 113* 108*  BUN 7* 10 12 14 13   CREATININE 0.97 0.98 0.88 0.79 0.73  CALCIUM 9.2 8.4* 8.0* 8.4* 8.0*  MG  --   --  1.8 1.8 1.7  AST 20  --   --   --   --   ALT 13  --   --   --   --   ALKPHOS 75  --   --   --   --   BILITOT 0.7  --   --   --   --    ------------------------------------------------------------------------------------------------------------------ No results for input(s): CHOL, HDL, LDLCALC, TRIG, CHOLHDL, LDLDIRECT in the last 72 hours.  Lab Results  Component Value Date   HGBA1C 5.4 06/02/2019   ------------------------------------------------------------------------------------------------------------------ No results for input(s): TSH, T4TOTAL, T3FREE, THYROIDAB in the last 72 hours.  Invalid input(s): FREET3  Cardiac Enzymes No results for input(s): CKMB, TROPONINI, MYOGLOBIN in the last 168 hours.  Invalid input(s): CK ------------------------------------------------------------------------------------------------------------------ No results found for: BNP  Micro Results Recent Results (from the past 240 hour(s))  Novel Coronavirus, NAA ([facility])     Status: None   Collection Time: 12/16/19 10:32 AM   Specimen: Nasopharyngeal(NP) swabs in vial transport medium   NASOPHARYNGE  TESTING  Result Value Ref Range Status   SARS-CoV-2, NAA Not Detected Not Detected Final    Comment: This nucleic acid amplification test was developed and its performance characteristics determined by [employer]. Nucleic acid amplification tests include RT-PCR and TMA. This test has not been FDA cleared or approved. This test has been authorized by FDA  under an Emergency Use Authorization (EUA). This test is only authorized for the duration of time the declaration that circumstances exist justifying the authorization of the emergency use of in vitro diagnostic tests for detection of SARS-CoV-2 virus and/or diagnosis of COVID-19 infection under section 564(b)(1) of the Act, 21 U.S.C. 121FXJ-8(I) (1), unless the authorization is terminated or revoked sooner. When diagnostic testing is negative, the possibility of a false negative result should be considered in the context of a patient's recent exposures and the presence of clinical signs and symptoms consistent with  COVID-19. An individual without symptoms of COVID-19 and who is not shedding SARS-CoV-2 virus wo uld expect to have a negative (not detected) result in this assay.   SARS CORONAVIRUS 2 (TAT 6-24 HRS) Nasopharyngeal Nasopharyngeal Swab     Status: Abnormal   Collection Time: 12/20/19  4:29 PM   Specimen: Nasopharyngeal Swab  Result Value Ref Range Status   SARS Coronavirus 2 POSITIVE (A) NEGATIVE Final    Comment: RESULT CALLED TO, READ BACK BY AND VERIFIED WITH: C. HARTGROVE,RN 2246 12/20/2019 T. TYSOR (NOTE) SARS-CoV-2 target nucleic acids are DETECTED. The SARS-CoV-2 RNA is generally detectable in upper and lower respiratory specimens during the acute phase of infection. Positive results are indicative of the presence of SARS-CoV-2 RNA. Clinical correlation with patient history and other diagnostic information is  necessary to determine patient infection status. Positive results do not rule out bacterial infection or co-infection with other viruses.  The expected result is Negative. Fact Sheet for Patients: SugarRoll.be Fact Sheet for Healthcare Providers: https://www.woods-mathews.com/ This test is not yet approved or cleared by the [place] FDA and  has been authorized for detection and/or diagnosis of SARS-CoV-2 by FDA  under an Emergency Use Authorization (EUA). This EUA will remain  in effect (meaning this test can be used) fo r the duration of the COVID-19 declaration under Section 564(b)(1) of the Act, 21 U.S.C. section 360bbb-3(b)(1), unless the authorization is terminated or revoked sooner. Performed at [facility], [place] [address] [place], [place] [address]   SARS CORONAVIRUS 2 (TAT 6-24 HRS) Nasopharyngeal Nasopharyngeal Swab     Status: Abnormal   Collection Time: 12/21/19 11:11 AM   Specimen: Nasopharyngeal Swab  Result Value Ref Range Status   SARS Coronavirus 2 POSITIVE (A) NEGATIVE Final    Comment: RESULT CALLED TO, READ BACK BY AND VERIFIED WITH: Alphonsus Sias RN.@1600  ON 2.14.21 BY TCALDWELL MT. (NOTE) SARS-CoV-2 target nucleic acids are DETECTED. The SARS-CoV-2 RNA is generally detectable in upper and lower respiratory specimens during the acute phase of infection. Positive results are indicative of the presence of SARS-CoV-2 RNA. Clinical correlation with patient history and other diagnostic information is  necessary to determine patient infection status. Positive results do not rule out bacterial infection or co-infection with other viruses.  The expected result is Negative. Fact Sheet for Patients: SugarRoll.be Fact Sheet for Healthcare Providers: https://www.woods-mathews.com/ This test is not yet approved or cleared by the [place] FDA and  has been authorized for detection and/or diagnosis of SARS-CoV-2 by FDA under an Emergency Use Authorization (EUA). This EUA will remain  in effect (meaning this test can be  used) for the duration of the COVID-19 declaration under Section 564(b)(1) of the Act, 21 U.S.C. section 360bbb-3(b)(1), unless the authorization is terminated or revoked sooner. Performed at [facility], [place] [address], [place], [place] [address]     Radiology Reports CT ABDOMEN PELVIS W  CONTRAST  Result Date: 12/20/2019 CLINICAL DATA:  Lower abdominal pain for 2 days EXAM: CT ABDOMEN AND PELVIS WITH CONTRAST TECHNIQUE: Multidetector CT imaging of the abdomen and pelvis was performed using the standard protocol following bolus administration of intravenous contrast. CONTRAST:  140m OMNIPAQUE IOHEXOL 300 MG/ML  SOLN COMPARISON:  10/11/2017 FINDINGS: Lower chest: No acute abnormality. Hepatobiliary: No focal liver abnormality is seen. Status post cholecystectomy. No biliary dilatation. Pancreas: Unremarkable. No pancreatic ductal dilatation or surrounding inflammatory changes. Spleen: Normal in size without focal abnormality. Adrenals/Urinary Tract: Adrenal glands are within normal limits. Kidneys demonstrate  a normal enhancement pattern. No renal calculi are seen. Small renal cysts are noted. No obstructive changes are seen. The bladder is partially distended. Stomach/Bowel: Scattered diverticular change of the colon is noted without evidence of diverticulitis. The appendix is within normal limits. Multiple dilated loops of small bowel are noted. There is a transition zone in the anterior abdomen beneath areas of prior ventral hernia repair consistent with adhesions. The more distal small bowel is decompressed and within normal limits. Some mild free fluid is noted likely reactive in this area. Laxity of the anterior abdominal wall is seen without definitive herniation. Vascular/Lymphatic: Aortic atherosclerosis. No enlarged abdominal or pelvic lymph nodes. Reproductive: Prostate is enlarged in size. Other: Laxity of the anterior abdominal wall is noted without true herniation. Mild free fluid is noted as described above. Musculoskeletal: No fracture is seen. IMPRESSION: Changes consistent with partial small bowel obstruction in the region of the proximal ileum. This is felt to be related to adhesions given the transition zone anteriorly with multiple clustered loops of small bowel in this region.  Diverticulosis without diverticulitis. No other focal abnormality is noted. Electronically Signed   By: Inez Catalina M.D.   On: 12/20/2019 13:45   DG Chest Port 1 View  Result Date: 12/24/2019 CLINICAL DATA:  COVID-19 positive patient. EXAM: PORTABLE CHEST 1 VIEW COMPARISON:  PA and lateral chest 11/09/2017. FINDINGS: There is left basilar airspace disease consistent with pneumonia. The right lung is clear. Heart size is normal. No pneumothorax or pleural effusion. IMPRESSION: Left basilar pneumonia. Electronically Signed   By: Inge Rise M.D.   On: 12/24/2019 11:49   DG Abd Portable 1V  Result Date: 12/23/2019 CLINICAL DATA:  Small bowel obstruction EXAM: PORTABLE ABDOMEN - 1 VIEW COMPARISON:  12/22/2019 FINDINGS: NG tube is in the stomach. Dilated bowel loops in the abdomen include dominantly small bowel, but some prominent colon noted in the left lower quadrant. Bowel gas pattern not significantly changed since prior study. Oral contrast material seen within the colon. Prior cholecystectomy. IMPRESSION: Prominent small and large bowel within the abdomen and pelvis, not significantly changed. NG tube is in the stomach. Electronically Signed   By: Rolm Baptise M.D.   On: 12/23/2019 08:53   DG Abd Portable 1V  Result Date: 12/22/2019 CLINICAL DATA:  Small-bowel obstruction EXAM: PORTABLE ABDOMEN - 1 VIEW COMPARISON:  12/21/2019; 12/20/2019; CT abdomen pelvis-12/20/2019 FINDINGS: Redemonstrated gaseous distension of several loops of small bowel with index loop of small bowel in the left mid hemiabdomen measures 5.1 cm in diameter. There is however progressive gaseous distension of the colon, particularly the ascending and transverse colon. Nondiagnostic evaluation for pneumoperitoneum secondary to supine positioning and exclusion of the lower thorax. No pneumatosis or portal venous gas. Post cholecystectomy. Degenerative change of the lower lumbar spine and bilateral hips is suspected though  incompletely evaluated. IMPRESSION: Nonspecific gaseous distension of several loops of small bowel within left mid hemiabdomen though with progressive gaseous distension of colon, findings are nonspecific though could be seen the setting of ileus versus partial/incomplete small bowel obstruction. Electronically Signed   By: Sandi Mariscal M.D.   On: 12/22/2019 07:48   DG Abd Portable 1V-Small Bowel Obstruction Protocol-initial, 8 hr delay  Result Date: 12/21/2019 CLINICAL DATA:  Small bowel obstruction. Follow-up. EXAM: PORTABLE ABDOMEN - 1 VIEW COMPARISON:  12/20/2019 FINDINGS: Stable enteric tube with tip in the body of stomach. Changes of partial small bowel obstruction again noted with persistent dilated loops of small bowel within the left  abdomen measuring up to 4.3 cm. No free air. IMPRESSION: Partial small bowel obstruction pattern, similar to previous exam. Electronically Signed   By: Kerby Moors M.D.   On: 12/21/2019 06:38   DG Abd Portable 1V-Small Bowel Protocol-Position Verification  Result Date: 12/20/2019 CLINICAL DATA:  NG tube placement. EXAM: PORTABLE ABDOMEN - 1 VIEW COMPARISON:  None. FINDINGS: Enteric tube appears appropriately positioned in the stomach. Surgical clips are seen in the RIGHT upper quadrant and LEFT upper quadrant. Mildly prominent small bowel loops noted within the central abdomen and LEFT upper quadrant, corresponding to the distended small bowel loops demonstrated on abdominal CT earlier today. IMPRESSION: Enteric tube appears appropriately positioned in the stomach. Electronically Signed   By: Franki Cabot M.D.   On: 12/20/2019 17:01

## 2019-12-25 NOTE — Progress Notes (Addendum)
Patient c/o worsened bloating and discomfort around left side of abdomen after eating lunch. Also belching and has some nausea. Zofran, and mylanta/maalox given and awaiting effectiveness. Message left for surgery team.

## 2019-12-25 NOTE — Progress Notes (Signed)
Patient continues to complain of burning sensation down his esophagus and his stomach with a lot of belching. Upon RN assessment, abdomen is soft but tight upon palpation. Per patient he is passing gas while ambulating. CCS paged and Dr. Annye English called back and gave me a verbal order for liquid carafate 1gm/2mL 3 times a day with meals and at bedtime. Order read back to MD and verified. Will administer to patient as ordered and continue to monitor.

## 2019-12-25 NOTE — Progress Notes (Signed)
Spoke with patient's wife, Velva Harman, and updated on patient's status.

## 2019-12-25 NOTE — Progress Notes (Addendum)
CC: Abdominal pain  Subjective: Patient denies abdominal pain or nausea this AM. Having bowel function. Ambulating. Does not want home therapy for COVID.   Review of Systems  Constitutional: Negative for chills and fever.  Respiratory: Negative for shortness of breath.   Cardiovascular: Negative for chest pain.  Gastrointestinal: Negative for abdominal pain, nausea and vomiting.  Genitourinary: Negative for dysuria, frequency and urgency.  All other systems reviewed and are negative.   Objective: Vital signs in last 24 hours: Temp:  [97.9 F (36.6 C)-99.1 F (37.3 C)] 99.1 F (37.3 C) (02/18 0648) Pulse Rate:  [80-82] 80 (02/18 0648) Resp:  [18-20] 20 (02/18 0648) BP: (117-132)/(82-83) 132/82 (02/18 0648) SpO2:  [93 %-97 %] 97 % (02/18 0648) Last BM Date: 12/24/19 480 p.o. 1500 IV 900 urine recorded No BM recorded T-max 99.1 Labs shows potassium at 3.8, magnesium 1.7, glucose 108, LDH 253 Ferritin 403 CRP 1.6 Procalcitonin, <0.10 WBC 5.4 Hemoglobin 15.5 Hematocrit 45 Platelets 85,000   Intake/Output from previous day: 02/17 0701 - 02/18 0700 In: 1925.7 [P.O.:480; I.V.:1195.7; IV Piggyback:250] Out: 900 [Urine:900] Intake/Output this shift: No intake/output data recorded.  Physical Exam: General: pleasant, WD, WN white male who is laying in bed in NAD HEENT:  Sclera are noninjected.  PERRL.  Ears and nose without any masses or lesions.  Mouth is pink and moist Heart: regular, rate, and rhythm.  Normal s1,s2. No obvious murmurs, gallops, or rubs noted.  Palpable radial and pedal pulses bilaterally Lungs: CTAB, no wheezes, rhonchi, or rales noted.  Respiratory effort nonlabored Abd: soft, NT, mildly distended, +BS, no masses, hernias, or organomegaly MS: all 4 extremities are symmetrical with no cyanosis, clubbing, or edema. Skin: warm and dry with no masses, lesions, or rashes Neuro: Cranial nerves 2-12 grossly intact, sensation intact Psych: A&Ox3 with  an appropriate affect.   Lab Results:  Recent Labs    12/23/19 0409 12/25/19 0500  WBC 5.7 5.4  HGB 16.4 15.5  HCT 49.1 45.1  PLT 97* 85*    BMET Recent Labs    12/24/19 0526 12/25/19 0500  NA 135 137  K 3.3* 3.8  CL 98 100  CO2 26 26  GLUCOSE 113* 108*  BUN 14 13  CREATININE 0.79 0.73  CALCIUM 8.4* 8.0*   PT/INR No results for input(s): LABPROT, INR in the last 72 hours.  Recent Labs  Lab 12/20/19 1113  AST 20  ALT 13  ALKPHOS 75  BILITOT 0.7  PROT 7.2  ALBUMIN 4.0     Lipase     Component Value Date/Time   LIPASE 19 12/20/2019 1113     Medications: . bisacodyl  10 mg Rectal Once  . carbamazepine  100 mg Oral TID  . enoxaparin (LOVENOX) injection  40 mg Subcutaneous Q24H  . gabapentin  400 mg Oral QID  . hydrochlorothiazide  12.5 mg Oral Daily  . losartan  50 mg Oral Daily  . pantoprazole (PROTONIX) IV  40 mg Intravenous Daily   . dextrose 5 % and 0.9 % NaCl with KCl 40 mEq/L 100 mL/hr at 12/25/19 0421  . remdesivir 100 mg in NS 100 mL 100 mg (12/25/19 0814)   Assessment/Plan COVID + - asymptomatic,Positive x 2 - 2/13 & 2/14 Covid pneumonia with COPD 12/24/19 Trigeminal neuralgia- Home meds this am during clamping trial HTN - Home meds this am during clamping trial HLD  GERD - Protonix Daily Dehydration - H/H: 18.2/53.4>>17.5/52.1 >>16.4/49.1 Thrombocytopenia - platelets 134K>> 97K >> 85K Hypokalemia -  K+ being replaced K+ 4.0 or greater goal  pSBO -Multiple prior surgeries. Appears to have a piece of small bowel kinked to his abdominal wall as transition point. - tolerating CLD and having bowel function, abdominal exam benign - advance to FLD and then ADAT to soft  - Keep K> 4 and Mg > 2 for bowel function - Mobilize for bowel function.  FEN -FLD VTE -SCDs,Lovenox ID -remdesivir 2/17 >> day 2    LOS: 5 days    JENNINGS,WILLARD 12/25/2019 Please see Amion   Addendum: -con't to FLD -mobilize -Hopefully home soon if  con't to do well from SBO standpoint. -Covid Rx per Medicine team

## 2019-12-26 LAB — BASIC METABOLIC PANEL
Anion gap: 9 (ref 5–15)
BUN: 10 mg/dL (ref 8–23)
CO2: 22 mmol/L (ref 22–32)
Calcium: 7.7 mg/dL — ABNORMAL LOW (ref 8.9–10.3)
Chloride: 105 mmol/L (ref 98–111)
Creatinine, Ser: 0.65 mg/dL (ref 0.61–1.24)
GFR calc Af Amer: >60 mL/min (ref >60)
GFR calc non Af Amer: >60 mL/min (ref >60)
Glucose, Bld: 105 mg/dL — ABNORMAL HIGH (ref 70–99)
Potassium: 4.1 mmol/L (ref 3.5–5.1)
Sodium: 136 mmol/L (ref 135–145)

## 2019-12-26 MED ORDER — FUROSEMIDE 20 MG PO TABS
20.0000 mg | ORAL_TABLET | Freq: Once | ORAL | Status: AC
  Administered 2019-12-26: 20 mg via ORAL
  Filled 2019-12-26: qty 1

## 2019-12-26 MED ORDER — ACETAMINOPHEN 325 MG PO TABS: 650.0000 mg | ORAL_TABLET | Freq: Four times a day (QID) | ORAL | Status: AC | PRN

## 2019-12-26 MED ORDER — ACETAMINOPHEN 325 MG PO TABS: 650.0000 mg | ORAL_TABLET | ORAL | Status: DC | PRN

## 2019-12-26 NOTE — Progress Notes (Signed)
Patient scheduled for outpatient Remdesivir infusion at 1:00 PM on Saturday 2/20 and at 10:00 AM on Sunday 2/21.  Please advise them to report to [facility] at [address]  Drive to the security guard and tell them you are here for an infusion. They will direct you to the front entrance where we will come and get you.  For questions call [phone].  Thanks

## 2019-12-26 NOTE — Progress Notes (Addendum)
Subjective: CC: Reflux Patient reports last night he had some abdominal distension, burping and belching. He notes that after Carafate his symptoms completely resolved. He has very mild generalized abdominal pain. He denies any abdominal distension, bloating, n/v. He is passing flatus. He notes BM that was soft both last night and this morning. He is mobilizing in his room.   ROS: See above, otherwise other systems negative   Objective: Vital signs in last 24 hours: Temp:  [98.2 F (36.8 C)-98.4 F (36.9 C)] 98.3 F (36.8 C) (02/19 0559) Pulse Rate:  [77-82] 77 (02/19 0559) Resp:  [18-20] 20 (02/19 0559) BP: (126-143)/(80-93) 143/93 (02/19 0559) SpO2:  [96 %] 96 % (02/19 0559) Last BM Date: 12/26/19  Intake/Output from previous day: 02/18 0701 - 02/19 0700 In: 1737.7 [I.V.:1637.7; IV Piggyback:100] Out: -  Intake/Output this shift: No intake/output data recorded.  PE: Gen:  Alert, NAD, pleasant Card:  RRR Pulm:  CTAB, no W/R/R, effort normal Abd: Soft, ND, minimal tenderness of the RLQ and epigastrium. Much improved from my prior exam a few days ago. +BS. Prior abdominal scars noted Psych: A&Ox3  Skin: no rashes noted, warm and dry  Lab Results:  Recent Labs    12/25/19 0500  WBC 5.4  HGB 15.5  HCT 45.1  PLT 85*   BMET Recent Labs    12/25/19 0500 12/26/19 0722  NA 137 136  K 3.8 4.1  CL 100 105  CO2 26 22  GLUCOSE 108* 105*  BUN 13 10  CREATININE 0.73 0.65  CALCIUM 8.0* 7.7*   PT/INR No results for input(s): LABPROT, INR in the last 72 hours. CMP     Component Value Date/Time   NA 136 12/26/2019 0722   K 4.1 12/26/2019 0722   CL 105 12/26/2019 0722   CO2 22 12/26/2019 0722   GLUCOSE 105 (H) 12/26/2019 0722   GLUCOSE 118 (H) 10/25/2006 1414   BUN 10 12/26/2019 0722   CREATININE 0.65 12/26/2019 0722   CREATININE 0.97 12/31/2012 1108   CALCIUM 7.7 (L) 12/26/2019 0722   CALCIUM 9.3 01/23/2012 1045   PROT 7.2 12/20/2019 1113   ALBUMIN 4.0  12/20/2019 1113   AST 20 12/20/2019 1113   ALT 13 12/20/2019 1113   ALKPHOS 75 12/20/2019 1113   BILITOT 0.7 12/20/2019 1113   GFRNONAA >60 12/26/2019 0722   GFRAA >60 12/26/2019 0722   Lipase     Component Value Date/Time   LIPASE 19 12/20/2019 1113       Studies/Results: No results found.  Anti-infectives: Anti-infectives (From admission, onward)   Start     Dose/Rate Route Frequency Ordered Stop   12/25/19 1000  remdesivir 100 mg in sodium chloride 0.9 % 100 mL IVPB     100 mg 200 mL/hr over 30 Minutes Intravenous Daily 12/24/19 1649 12/29/19 0959   12/24/19 1700  remdesivir 200 mg in sodium chloride 0.9% 250 mL IVPB     200 mg 580 mL/hr over 30 Minutes Intravenous Once 12/24/19 1649 12/24/19 1750       Assessment/Plan COVID + w/ PNA-Positive x 2 - 2/13 & 2/14. Now with PNA (dx 2/17). Appreciate [facility] assistance. Currently getting Remdesivir.  COPD Trigeminal neuralgia- Home meds  HTN - Home meds  HLD  GERD - Scheduled Protonix Daily and TID Carafate. PRN Maalox  Thrombocytopenia - platelets 134K>> 97K >> 85K Hypokalemia - Resolved   pSBO -Multiple prior surgeries - Adv to soft diet - Mobilize  FEN -Soft  diet, SLIV  VTE -SCDs,Lovenox ID - Remdesivir 2/17 - 2/22  Plan: If tolerates soft diet, patient is okay for discharge from a surgical standpoint. Will check with [facility] if he will need to stay for Remdesivir. [facility] notes says they offered outpatient infusion, however patient declined as he lives far away. Not sure if he will need to be transferred to [facility] if there is no surgical indication to keep him here at [facility]?    LOS: 6 days    Jillyn Ledger , [facility] 12/26/2019, 11:39 AM Please see Amion for pager number during day hours 7:00am-4:30pm

## 2019-12-26 NOTE — Discharge Summary (Addendum)
[place] [facility] Discharge Summary   Patient ID: [name] MRN: [id number] DOB/AGE: 1951/01/01 69 y.o.  Admit date: 12/20/2019 Discharge date: 12/29/2019  Admitting Diagnosis: sbo  Discharge Diagnosis Patient Active Problem List   Diagnosis Date Noted  . COVID-19 virus infection 12/24/2019  . SBO (small bowel obstruction) ([place]) 12/20/2019  . Multifocal pneumonia 09/12/2017  . Enteritis 09/12/2017  . Hyponatremia 09/12/2017  . CAP (community acquired pneumonia) 09/12/2017  . Acute sinusitis 07/06/2017  . Hyperglycemia 01/18/2017  . Preventative health care 01/13/2016  . Absolute anemia 10/15/2015  . Cervical spondylosis, treated by Dr. Gladstone Lighter, [facility] 06/12/2013  . Ventral hernia 03/04/2013  . Anxiety and depression, followed by Dr. [name] in Psych 11/19/2012  . Hiatal hernia, followed by Dr. Hassell Done ([place]) and Dr. Olevia Perches (GI) 11/19/2012  . Other testicular hypofunction, followed by Dr. Karsten Ro in Urology 10/15/2012  . Trigeminal neuralgia, s/p NSU, followed by Dr. Vertell Limber 10/15/2012  . BPH (benign prostatic hyperplasia), followed by Dr. Karsten Ro 07/26/2011  . Rosacea 09/02/2010  . COLONIC POLYPS, HYPERPLASTIC, HX OF 12/13/2009  . Obstructive chronic bronchitis without exacerbation, followed by Dr. [name] 03/04/2009  . Allergic rhinitis 04/03/2008  . Essential hypertension 05/08/2007  . GERD followed by Dr. Olevia Perches 05/08/2007    Consultants Internal medicine  Imaging: No results found.  Procedures None  Hospital Course:  Patient is a 69 year old male who presented to [facility] with 24 hrs abdominal pain, nausea, and distention.  Workup showed PSBO.  Patient was admitted and started on small bowel obstruction protocol. He was incidentally found to be COVID positive on admission testing. Patient reported a negative test the Tuesday prior to admission, and requested a second confirmatory test which resulted positive. Patient progressed on the small bowel  protocol and diet was advanced slowly. On 2/17 patient had some increased respiratory symptoms with CXR consistent with pneumonia, internal medicine consulted for management of COVID PNA.   On 12/27/19, the patient was voiding well, tolerating diet, ambulating well, pain improved, vital signs stable and felt stable for discharge home.  Patient will follow up with PCP and Dr. Hassell Done as needed.     Allergies as of 12/27/2019      Reactions   Dilantin [phenytoin]    bradycardia   Dilaudid [hydromorphone Hcl]    Ace Inhibitors Cough   Sulfa Antibiotics    hives   Augmentin [amoxicillin-pot Clavulanate] Nausea And Vomiting   Codeine Nausea And Vomiting      Medication List    STOP taking these medications   sucralfate 1 g tablet Commonly known as: CARAFATE Replaced by: sucralfate 1 GM/10ML suspension     TAKE these medications   acetaminophen 325 MG tablet Commonly known as: TYLENOL Take 2 tablets (650 mg total) by mouth every 6 (six) hours as needed for mild pain, moderate pain or fever.   Align 4 MG Caps Take by mouth daily.   ALPRAZolam 1 MG tablet Commonly known as: XANAX Take 1 mg by mouth at bedtime as needed for anxiety.   baclofen 10 MG tablet Commonly known as: LIORESAL Take 10 mg by mouth daily as needed for muscle spasms.   carbamazepine 100 MG 12 hr tablet Commonly known as: TEGRETOL XR Take 100 mg by mouth 3 (three) times daily.   cholecalciferol 25 MCG (1000 UNIT) tablet Commonly known as: VITAMIN D3 Take 1,000 Units by mouth daily.   Dexilant 60 MG capsule Generic drug: dexlansoprazole TAKE 1 CAPSULE BY MOUTH DAILY   esomeprazole 40  MG capsule Commonly known as: NEXIUM Take 40 mg by mouth daily at 12 noon.   gabapentin 400 MG capsule Commonly known as: NEURONTIN Take 400 mg by mouth 4 (four) times daily.   losartan-hydrochlorothiazide 50-12.5 MG tablet Commonly known as: HYZAAR Take 1 tablet by mouth daily.   pantoprazole 40 MG  tablet Commonly known as: PROTONIX Take 1 tablet (40 mg total) by mouth daily. 30 minutes before breakfast   polyethylene glycol powder 17 GM/SCOOP powder Commonly known as: GLYCOLAX/MIRALAX DISSOLVE 1 CAPFUL IN LIQUID EVERY DAY AS NEEDED FOR CONSTIPATION   ranitidine 300 MG tablet Commonly known as: ZANTAC Take 1 tablet (300 mg total) by mouth at bedtime. What changed:   when to take this  reasons to take this   sucralfate 1 GM/10ML suspension Commonly known as: CARAFATE Take 10 mLs (1 g total) by mouth 4 (four) times daily -  with meals and at bedtime. Replaces: sucralfate 1 g tablet        Follow-up Information    [name], MD. Call.   Specialties: Internal Medicine, Radiology Why: To let them know you were in them you were in the hospital for a small bowel obstruction and COVID with pneumonia  Contact information: [place] [address] [phone]        Lottie Mussel Follow up.   Why:  10:00 AM on Sunday 2/21. For IV Remdesivir. Drive to the security guard and tell them you are here for an infusion. They will direct you to the front entrance where we will come and get you.  For questions call [phone]       Johnathan Hausen, MD. Call.   Specialty: General Surgery Why: Call as needed with questions or concerns  Contact information: [address] [place] [address] [phone]           Signed: Brigid Re, [facility] 12/29/2019, 3:58 PM Please see Amion for pager number during day hours 7:00am-4:30pm

## 2019-12-26 NOTE — Discharge Instructions (Addendum)
Bowel Obstruction A bowel obstruction means that something is blocking the small or large bowel. The bowel is also called the intestine. It is the long tube that connects the stomach to the opening of the butt (anus). When something blocks the bowel, food and fluids cannot pass through like normal. This condition needs to be treated. Treatment depends on the cause of the problem and how bad the problem is. What are the causes? Common causes of this condition include:  Scar tissue (adhesions) from past surgery or from high-energy X-rays (radiation).  Recent surgery in the belly. This affects how food moves in the bowel.  Some diseases, such as: ? Irritation of the lining of the digestive tract (Crohn's disease). ? Irritation of small pouches in the bowel (diverticulitis).  Growths or tumors.  A bulging organ (hernia).  Twisting of the bowel (volvulus).  A foreign body.  Slipping of a part of the bowel into another part (intussusception). What are the signs or symptoms? Symptoms of this condition include:  Pain in the belly.  Feeling sick to your stomach (nauseous).  Throwing up (vomiting).  Bloating in the belly.  Being unable to pass gas.  Trouble pooping (constipation).  Watery poop (diarrhea).  A lot of belching. How is this diagnosed? This condition may be diagnosed based on:  A physical exam.  Medical history.  Imaging tests, such as X-ray or CT scan.  Blood tests.  Urine tests. How is this treated? Treatment for this condition may include:  Fluids and pain medicines that are given through an IV tube. Your doctor may tell you not to eat or drink if you feel sick to your stomach and are throwing up.  Eating a clear liquid diet for a few days.  Putting a small tube (nasogastric tube) into the stomach. This will help with pain, discomfort, and nausea by removing blocked air and fluids from the stomach.  Surgery. This may be needed if other treatments  do not work. Follow these instructions at home: Medicines  Take over-the-counter and prescription medicines only as told by your doctor.  If you were prescribed an antibiotic medicine, take it as told by your doctor. Do not stop taking the antibiotic even if you start to feel better. General instructions  Follow your diet as told by your doctor. You may need to: ? Only drink clear liquids until you start to get better. ? Avoid solid foods.  Return to your normal activities as told by your doctor. Ask your doctor what activities are safe for you.  Do not sit for a long time without moving. Get up to take short walks every 1-2 hours. This is important. Ask for help if you feel weak or unsteady.  Keep all follow-up visits as told by your doctor. This is important. How is this prevented? After having a bowel obstruction, you may be more likely to have another. You can do some things to stop it from happening again.  If you have a long-term (chronic) disease, contact your doctor if you see changes or problems.  Take steps to prevent or treat trouble pooping. Your doctor may ask that you: ? Drink enough fluid to keep your pee (urine) pale yellow. ? Take over-the-counter or prescription medicines. ? Eat foods that are high in fiber. These include beans, whole grains, and fresh fruits and vegetables. ? Limit foods that are high in fat and sugar. These include fried or sweet foods.  Stay active. Ask your doctor which exercises  are safe for you.  Avoid stress.  Eat three small meals and three small snacks each day.  Work with a Publishing rights manager (dietitian) to make a meal plan that works for you.  Do not use any products that contain nicotine or tobacco, such as cigarettes and e-cigarettes. If you need help quitting, ask your doctor. Contact a doctor if:  You have a fever.  You have chills. Get help right away if:  You have pain or cramps that get worse.  You throw up blood.  You are  sick to your stomach.  You cannot stop throwing up.  You cannot drink fluids.  You feel mixed up (confused).  You feel very thirsty (dehydrated).  Your belly gets more bloated.  You feel weak or you pass out (faint). Summary  A bowel obstruction means that something is blocking the small or large bowel.  Treatment may include IV fluids and pain medicine. You may also have a clear liquid diet, a small tube in your stomach, or surgery.  Drink clear liquids and avoid solid foods until you get better. This information is not intended to replace advice given to you by your health care provider. Make sure you discuss any questions you have with your health care provider. Document Revised: 03/06/2018 Document Reviewed: 03/06/2018 Elsevier Patient Education  [place] Please follow this diet for the next 2-3 weeks. After this, please transition to a high fiber diet  A soft-food eating plan includes foods that are safe and easy to chew and swallow. Your health care provider or dietitian can help you find foods and flavors that fit into this plan. Follow this plan until your health care provider or dietitian says it is safe to start eating other foods and food textures. What are tips for following this plan? General guidelines   Take small bites of food, or cut food into pieces about  inch or smaller. Bite-sized pieces of food are easier to chew and swallow.  Eat moist foods. Avoid overly dry foods.  Avoid foods that: ? Are difficult to swallow, such as dry, chunky, crispy, or sticky foods. ? Are difficult to chew, such as hard, tough, or stringy foods. ? Contain nuts, seeds, or fruits.  Follow instructions from your dietitian about the types of liquids that are safe for you to swallow. You may be allowed to have: ? Thick liquids only. This includes only liquids that are thicker than honey. ? Thin and thick liquids. This includes all beverages and  foods that become liquid at room temperature.  To make thick liquids: ? Purchase a commercial liquid thickening powder. These are available at grocery stores and pharmacies. ? Mix the thickener into liquids according to instructions on the label. ? Purchase ready-made thickened liquids. ? Thicken soup by pureeing, straining to remove chunks, and adding flour, potato flakes, or corn starch. ? Add commercial thickener to foods that become liquid at room temperature, such as milk shakes, yogurt, ice cream, gelatin, and sherbet.  Ask your health care provider whether you need to take a fiber supplement. Cooking  Cook meats so they stay tender and moist. Use methods like braising, stewing, or baking in liquid.  Cook vegetables and fruit until they are soft enough to be mashed with a fork.  Peel soft, fresh fruits such as peaches, nectarines, and melons.  When making soup, make sure chunks of meat and vegetables are smaller than  inch.  Reheat leftover foods slowly  so that a tough crust does not form. What foods are allowed? The items listed below may not be a complete list. Talk with your dietitian about what dietary choices are best for you. Grains Breads, muffins, pancakes, or waffles moistened with syrup, jelly, or butter. Dry cereals well-moistened with milk. Moist, cooked cereals. Well-cooked pasta and rice. Vegetables All soft-cooked vegetables. Shredded lettuce. Fruits All canned and cooked fruits. Soft, peeled fresh fruits. Strawberries. Dairy Milk. Cream. Yogurt. Cottage cheese. Soft cheese without the rind. Meats and other protein foods Tender, moist ground meat, poultry, or fish. Meat cooked in gravy or sauces. Eggs. Sweets and desserts Ice cream. Milk shakes. Sherbet. Pudding. Fats and oils Butter. Margarine. Olive, canola, sunflower, and grapeseed oil. Smooth salad dressing. Smooth cream cheese. Mayonnaise. Gravy. What foods are not allowed? The items listed bemay not  be a complete list. Talk with your dietitian about what dietary choices are best for you. Grains Coarse or dry cereals, such as bran, granola, and shredded wheat. Tough or chewy crusty breads, such as [place] bread or baguettes. Breads with nuts, seeds, or fruit. Vegetables All raw vegetables. Cooked corn. Cooked vegetables that are tough or stringy. Tough, crisp, fried potatoes and potato skins. Fruits Fresh fruits with skins or seeds, or both, such as apples, pears, and grapes. Stringy, high-pulp fruits, such as papaya, pineapple, coconut, and mango. Fruit leather and all dried fruit. Dairy Yogurt with nuts or coconut. Meats and other protein foods Hard, dry sausages. Dry meat, poultry, or fish. Meats with gristle. Fish with bones. Fried meat or fish. Lunch meat and hotdogs. Nuts and seeds. Chunky peanut butter or other nut butters. Sweets and desserts Cakes or cookies that are very dry or chewy. Desserts with dried fruit, nuts, or coconut. Fried pastries. Very rich pastries. Fats and oils Cream cheese with fruit or nuts. Salad dressings with seeds or chunks. Summary  A soft-food eating plan includes foods that are safe and easy to swallow. Generally, the foods should be soft enough to be mashed with a fork.  Avoid foods that are dry, hard to chew, crunchy, sticky, stringy, or crispy.  Ask your health care provider whether you need to thicken your liquids and if you need to take a fiber supplement. This information is not intended to replace advice given to you by your health care provider. Make sure you discuss any questions you have with your health care provider. Document Revised: 02/13/2019 Document Reviewed: 12/26/2016 Elsevier Patient Education  [place].   High-Fiber Diet Please transition to a high fiber diet after completing a soft diet for 2-3 weeks Fiber, also called dietary fiber, is a type of carbohydrate that is found in fruits, vegetables, whole grains, and  beans. A high-fiber diet can have many health benefits. Your health care provider may recommend a high-fiber diet to help:  Prevent constipation. Fiber can make your bowel movements more regular.  Lower your cholesterol.  Relieve the following conditions: ? Swelling of veins in the anus (hemorrhoids). ? Swelling and irritation (inflammation) of specific areas of the digestive tract (uncomplicated diverticulosis). ? A problem of the large intestine (colon) that sometimes causes pain and diarrhea (irritable bowel syndrome, IBS).  Prevent overeating as part of a weight-loss plan.  Prevent heart disease, type 2 diabetes, and certain cancers. What is my plan? The recommended daily fiber intake in grams (g) includes:  38 g for men age 30 or younger.  30 g for men over age 75.  7 g for  women age 60 or younger.  21 g for women over age 67. You can get the recommended daily intake of dietary fiber by:  Eating a variety of fruits, vegetables, grains, and beans.  Taking a fiber supplement, if it is not possible to get enough fiber through your diet. What do I need to know about a high-fiber diet?  It is better to get fiber through food sources rather than from fiber supplements. There is not a lot of research about how effective supplements are.  Always check the fiber content on the nutrition facts label of any prepackaged food. Look for foods that contain 5 g of fiber or more per serving.  Talk with a diet and nutrition specialist (dietitian) if you have questions about specific foods that are recommended or not recommended for your medical condition, especially if those foods are not listed below.  Gradually increase how much fiber you consume. If you increase your intake of dietary fiber too quickly, you may have bloating, cramping, or gas.  Drink plenty of water. Water helps you to digest fiber. What are tips for following this plan?  Eat a wide variety of high-fiber  foods.  Make sure that half of the grains that you eat each day are whole grains.  Eat breads and cereals that are made with whole-grain flour instead of refined flour or white flour.  Eat brown rice, bulgur wheat, or millet instead of white rice.  Start the day with a breakfast that is high in fiber, such as a cereal that contains 5 g of fiber or more per serving.  Use beans in place of meat in soups, salads, and pasta dishes.  Eat high-fiber snacks, such as berries, raw vegetables, nuts, and popcorn.  Choose whole fruits and vegetables instead of processed forms like juice or sauce. What foods can I eat?  Fruits Berries. Pears. Apples. Oranges. Avocado. Prunes and raisins. Dried figs. Vegetables Sweet potatoes. Spinach. Kale. Artichokes. Cabbage. Broccoli. Cauliflower. Green peas. Carrots. Squash. Grains Whole-grain breads. Multigrain cereal. Oats and oatmeal. Brown rice. Barley. Bulgur wheat. [place]. Quinoa. Bran muffins. Popcorn. Rye wafer crackers. Meats and other proteins [place], kidney, and pinto beans. Soybeans. Split peas. Lentils. Nuts and seeds. Dairy Fiber-fortified yogurt. Beverages Fiber-fortified soy milk. Fiber-fortified orange juice. Other foods Fiber bars. The items listed above may not be a complete list of recommended foods and beverages. Contact a dietitian for more options. What foods are not recommended? Fruits Fruit juice. Cooked, strained fruit. Vegetables Fried potatoes. Canned vegetables. Well-cooked vegetables. Grains White bread. Pasta made with refined flour. White rice. Meats and other proteins Fatty cuts of meat. Fried chicken or fried fish. Dairy Milk. Yogurt. Cream cheese. Sour cream. Fats and oils Butters. Beverages Soft drinks. Other foods Cakes and pastries. The items listed above may not be a complete list of foods and beverages to avoid. Contact a dietitian for more information. Summary  Fiber is a type of carbohydrate. It is  found in fruits, vegetables, whole grains, and beans.  There are many health benefits of eating a high-fiber diet, such as preventing constipation, lowering blood cholesterol, helping with weight loss, and reducing your risk of heart disease, diabetes, and certain cancers.  Gradually increase your intake of fiber. Increasing too fast can result in cramping, bloating, and gas. Drink plenty of water while you increase your fiber.  The best sources of fiber include whole fruits and vegetables, whole grains, nuts, seeds, and beans. This information is not intended to replace advice  given to you by your health care provider. Make sure you discuss any questions you have with your health care provider. Document Revised: 08/27/2017 Document Reviewed: 08/27/2017 Elsevier Patient Education  2020 [employer].

## 2019-12-26 NOTE — Progress Notes (Signed)
Consult -   PROGRESS NOTE                                                                                                                                                                                                             Patient Demographics:    [name], is a 69 y.o. male, DOB - [birth year], ORV:[id number]  Outpatient Primary MD for the patient is [name], MD    LOS - 6  Admit date - 12/20/2019    Chief Complaint  Patient presents with  . Abdominal Pain       Brief Narrative - [name] is an 69 y.o. male with h/o COPD; IBS; HTN; HLD; anxiety/depression; pernicious anemia; and h/o Nissen fundoplication with multiple subsequent repairs and known slipped Nissen who presented on 2/13 with partial SBO from adhesions.  He was found to be COVID + but thought to be asymptomatic.  He reports a long h/o GI issues.  He came in for abdominal issues.  He denies respiratory issues.  He was remotely intubated and subsequently saw pulmonology in f/u and was told he has scarring in the bases of his lungs.  He is baffled about the COVID since no one else in his bubble has it and he has been asymptomatic from this standpoint.   Subjective:   Patient in bed, appears comfortable, denies any headache, no fever, no chest pain or pressure, no shortness of breath , mild generalized abdominal pain. No focal weakness.   Assessment  & Plan :   1.   Acute Covid 19 Viral Pneumonitis during the ongoing 2020 Covid 19 Pandemic - he has mild disease at best, currently getting remdesivir, since it is started we can continue, offered him outpatient infusion but he refused as he lives far away.  Will monitor inflammatory markers, currently not on steroids and in no respiratory distress or oxygen demand hence will monitor.  Encouraged the patient to sit up in chair in the daytime use I-S and flutter valve for pulmonary toiletry and then  prone in bed when at night.  SpO2: 96 %  Recent Labs  Lab 12/20/19 1629 12/21/19 1111 12/24/19 1433 12/24/19 1457  CRP  --   --  1.6*  --   DDIMER  --   --  0.97*  --   FERRITIN  --   --  403*  --  PROCALCITON  --   --   --  <0.10  SARSCOV2NAA POSITIVE* POSITIVE*  --   --     Hepatic Function Latest Ref Rng & Units 12/20/2019 06/02/2019 01/24/2018  Total Protein 6.5 - 8.1 g/dL 7.2 6.6 7.2  Albumin 3.5 - 5.0 g/dL 4.0 4.1 4.7  AST 15 - 41 U/L 20 13 18   ALT 0 - 44 U/L 13 10 19   Alk Phosphatase 38 - 126 U/L 75 72 67  Total Bilirubin 0.3 - 1.2 mg/dL 0.7 0.6 0.7  Bilirubin, Direct 0.0 - 0.3 mg/dL - 0.1 0.2    2.  SBO.  Treatment per primary team which is general surgery.  Can DC IV fluids if oral intake is better.  He is having BMs, largely bedbound encouraged to sit up in chair and ambulate in the room.  Exam appears benign.  3.  Hypertension.  On combination of losartan HCTZ which will be continued.  4.  Anxiety.  Continue home meds.    Condition - Extremely Guarded  Family Communication  :  none  Code Status :  Full  Diet :   Diet Order            Diet full liquid Room service appropriate? Yes; Fluid consistency: Thin  Diet effective now               Disposition Plan  : Per primary team which is general surgery  Consults  : Hospitalist consulting for general surgery  Procedures  :    PUD Prophylaxis :    DVT Prophylaxis  :  Lovenox    Lab Results  Component Value Date   PLT 85 (L) 12/25/2019    Inpatient Medications  Scheduled Meds: . bisacodyl  10 mg Rectal Once  . carbamazepine  100 mg Oral TID  . enoxaparin (LOVENOX) injection  40 mg Subcutaneous Q24H  . gabapentin  400 mg Oral QID  . hydrochlorothiazide  12.5 mg Oral Daily  . losartan  50 mg Oral Daily  . pantoprazole (PROTONIX) IV  40 mg Intravenous Daily  . sucralfate  1 g Oral TID WC & HS   Continuous Infusions: . dextrose 5 % and 0.9 % NaCl with KCl 40 mEq/L 100 mL/hr at 12/26/19 0320   . remdesivir 100 mg in NS 100 mL 100 mg (12/25/19 0814)   PRN Meds:.ALPRAZolam, alum & mag hydroxide-simeth, metoprolol tartrate, morphine injection, ondansetron **OR** ondansetron (ZOFRAN) IV, phenol, promethazine  Antibiotics  :    Anti-infectives (From admission, onward)   Start     Dose/Rate Route Frequency Ordered Stop   12/25/19 1000  remdesivir 100 mg in sodium chloride 0.9 % 100 mL IVPB     100 mg 200 mL/hr over 30 Minutes Intravenous Daily 12/24/19 1649 12/29/19 0959   12/24/19 1700  remdesivir 200 mg in sodium chloride 0.9% 250 mL IVPB     200 mg 580 mL/hr over 30 Minutes Intravenous Once 12/24/19 1649 12/24/19 1750       Time Spent in minutes  30   Lala Lund M.D on 12/26/2019 at 9:20 AM  To page go to [email or web address] - password [email or web address]  [facility] -  Office  [phone]  See all Orders from today for further details    Objective:   Vitals:   12/25/19 0648 12/25/19 1627 12/25/19 2237 12/26/19 0559  BP: 132/82 126/80 (!) 142/89 (!) 143/93  Pulse: 80 79 82 77  Resp: 20 18 18 20   Temp: 99.1 F (37.3  C) 98.2 F (36.8 C) 98.4 F (36.9 C) 98.3 F (36.8 C)  TempSrc: Oral Oral    SpO2: 97%  96% 96%    Wt Readings from Last 3 Encounters:  01/02/19 103.3 kg  12/17/18 102.1 kg  07/16/18 97.7 kg     Intake/Output Summary (Last 24 hours) at 12/26/2019 0920 Last data filed at 12/26/2019 0550 Gross per 24 hour  Intake 1637.69 ml  Output --  Net 1637.69 ml     Physical Exam  Awake Alert, No new F.N deficits, Normal affect [place].AT,PERRAL Supple Neck,No JVD, No cervical lymphadenopathy appriciated.  Symmetrical Chest wall movement, Good air movement bilaterally, CTAB RRR,No Gallops, Rubs or new Murmurs, No Parasternal Heave +ve B.Sounds, Abd mildly distended but overall nontender, No organomegaly appriciated, No rebound - guarding or rigidity. No Cyanosis, Clubbing or edema, No new Rash or bruise     Data Review:    CBC Recent Labs  Lab  12/20/19 1113 12/21/19 0340 12/23/19 0409 12/25/19 0500  WBC 7.9 10.2 5.7 5.4  HGB 18.2* 17.5* 16.4 15.5  HCT 53.4* 52.1* 49.1 45.1  PLT 144* 134* 97* 85*  MCV 89.6 90.9 91.8 89.5  MCH 30.5 30.5 30.7 30.8  MCHC 34.1 33.6 33.4 34.4  RDW 12.6 13.0 13.2 13.1    Chemistries  Recent Labs  Lab 12/20/19 1113 12/20/19 1113 12/21/19 0340 12/23/19 0409 12/24/19 0526 12/25/19 0500 12/26/19 0722  NA 134*   < > 135 138 135 137 136  K 4.5   < > 4.1 3.3* 3.3* 3.8 4.1  CL 93*   < > 93* 100 98 100 105  CO2 31   < > 30 26 26 26 22   GLUCOSE 118*   < > 130* 108* 113* 108* 105*  BUN 7*   < > 10 12 14 13 10   CREATININE 0.97   < > 0.98 0.88 0.79 0.73 0.65  CALCIUM 9.2   < > 8.4* 8.0* 8.4* 8.0* 7.7*  MG  --   --   --  1.8 1.8 1.7  --   AST 20  --   --   --   --   --   --   ALT 13  --   --   --   --   --   --   ALKPHOS 75  --   --   --   --   --   --   BILITOT 0.7  --   --   --   --   --   --    < > = values in this interval not displayed.   ------------------------------------------------------------------------------------------------------------------ No results for input(s): CHOL, HDL, LDLCALC, TRIG, CHOLHDL, LDLDIRECT in the last 72 hours.  Lab Results  Component Value Date   HGBA1C 5.4 06/02/2019   ------------------------------------------------------------------------------------------------------------------ No results for input(s): TSH, T4TOTAL, T3FREE, THYROIDAB in the last 72 hours.  Invalid input(s): FREET3  Cardiac Enzymes No results for input(s): CKMB, TROPONINI, MYOGLOBIN in the last 168 hours.  Invalid input(s): CK ------------------------------------------------------------------------------------------------------------------ No results found for: BNP  Micro Results Recent Results (from the past 240 hour(s))  Novel Coronavirus, NAA ([facility])     Status: None   Collection Time: 12/16/19 10:32 AM   Specimen: Nasopharyngeal(NP) swabs in vial transport medium    NASOPHARYNGE  TESTING  Result Value Ref Range Status   SARS-CoV-2, NAA Not Detected Not Detected Final    Comment: This nucleic acid amplification test was developed and its performance characteristics determined by  [employer]. Nucleic acid amplification tests include RT-PCR and TMA. This test has not been FDA cleared or approved. This test has been authorized by FDA under an Emergency Use Authorization (EUA). This test is only authorized for the duration of time the declaration that circumstances exist justifying the authorization of the emergency use of in vitro diagnostic tests for detection of SARS-CoV-2 virus and/or diagnosis of COVID-19 infection under section 564(b)(1) of the Act, 21 U.S.C. 459XHF-4(F) (1), unless the authorization is terminated or revoked sooner. When diagnostic testing is negative, the possibility of a false negative result should be considered in the context of a patient's recent exposures and the presence of clinical signs and symptoms consistent with COVID-19. An individual without symptoms of COVID-19 and who is not shedding SARS-CoV-2 virus wo uld expect to have a negative (not detected) result in this assay.   SARS CORONAVIRUS 2 (TAT 6-24 HRS) Nasopharyngeal Nasopharyngeal Swab     Status: Abnormal   Collection Time: 12/20/19  4:29 PM   Specimen: Nasopharyngeal Swab  Result Value Ref Range Status   SARS Coronavirus 2 POSITIVE (A) NEGATIVE Final    Comment: RESULT CALLED TO, READ BACK BY AND VERIFIED WITH: C. HARTGROVE,RN 2246 12/20/2019 T. TYSOR (NOTE) SARS-CoV-2 target nucleic acids are DETECTED. The SARS-CoV-2 RNA is generally detectable in upper and lower respiratory specimens during the acute phase of infection. Positive results are indicative of the presence of SARS-CoV-2 RNA. Clinical correlation with patient history and other diagnostic information is  necessary to determine patient infection status. Positive results do not rule out  bacterial infection or co-infection with other viruses.  The expected result is Negative. Fact Sheet for Patients: SugarRoll.be Fact Sheet for Healthcare Providers: https://www.woods-mathews.com/ This test is not yet approved or cleared by the [place] FDA and  has been authorized for detection and/or diagnosis of SARS-CoV-2 by FDA under an Emergency Use Authorization (EUA). This EUA will remain  in effect (meaning this test can be used) fo r the duration of the COVID-19 declaration under Section 564(b)(1) of the Act, 21 U.S.C. section 360bbb-3(b)(1), unless the authorization is terminated or revoked sooner. Performed at [facility], [place] [address], [place], [place] [address]   SARS CORONAVIRUS 2 (TAT 6-24 HRS) Nasopharyngeal Nasopharyngeal Swab     Status: Abnormal   Collection Time: 12/21/19 11:11 AM   Specimen: Nasopharyngeal Swab  Result Value Ref Range Status   SARS Coronavirus 2 POSITIVE (A) NEGATIVE Final    Comment: RESULT CALLED TO, READ BACK BY AND VERIFIED WITH: Alphonsus Sias [email or web address]  ON 2.14.21 BY TCALDWELL MT. (NOTE) SARS-CoV-2 target nucleic acids are DETECTED. The SARS-CoV-2 RNA is generally detectable in upper and lower respiratory specimens during the acute phase of infection. Positive results are indicative of the presence of SARS-CoV-2 RNA. Clinical correlation with patient history and other diagnostic information is  necessary to determine patient infection status. Positive results do not rule out bacterial infection or co-infection with other viruses.  The expected result is Negative. Fact Sheet for Patients: SugarRoll.be Fact Sheet for Healthcare Providers: https://www.woods-mathews.com/ This test is not yet approved or cleared by the [place] FDA and  has been authorized for detection and/or diagnosis of SARS-CoV-2 by FDA under an Emergency Use Authorization  (EUA). This EUA will remain  in effect (meaning this test can be  used) for the duration of the COVID-19 declaration under Section 564(b)(1) of the Act, 21 U.S.C. section 360bbb-3(b)(1), unless the authorization is terminated or revoked sooner. Performed at [facility], [place] [address] [place], [place] [address]     Radiology Reports CT ABDOMEN PELVIS W CONTRAST  Result Date: 12/20/2019 CLINICAL DATA:  Lower abdominal pain for 2 days EXAM: CT ABDOMEN AND PELVIS WITH CONTRAST TECHNIQUE: Multidetector CT imaging of the abdomen and pelvis was performed using the standard protocol following bolus administration of intravenous contrast. CONTRAST:  179m OMNIPAQUE IOHEXOL 300 MG/ML  SOLN COMPARISON:  10/11/2017 FINDINGS: Lower chest: No acute abnormality. Hepatobiliary: No focal liver abnormality is seen. Status post cholecystectomy. No biliary dilatation. Pancreas: Unremarkable. No pancreatic ductal dilatation or surrounding inflammatory changes. Spleen: Normal in size without focal abnormality. Adrenals/Urinary Tract: Adrenal glands are within normal limits. Kidneys demonstrate a normal enhancement pattern. No renal calculi are seen. Small renal cysts are noted. No obstructive changes are seen. The bladder is partially distended. Stomach/Bowel: Scattered diverticular change of the colon is noted without evidence of diverticulitis. The appendix is within normal limits. Multiple dilated loops of small bowel are noted. There is a transition zone in the anterior abdomen beneath areas of prior ventral hernia repair consistent with adhesions. The more distal small bowel is decompressed and within normal limits. Some mild free fluid is noted likely reactive in this area. Laxity of the anterior abdominal wall is seen without definitive herniation. Vascular/Lymphatic: Aortic atherosclerosis. No enlarged abdominal or pelvic lymph nodes. Reproductive: Prostate is enlarged in size. Other: Laxity of the anterior  abdominal wall is noted without true herniation. Mild free fluid is noted as described above. Musculoskeletal: No fracture is seen. IMPRESSION: Changes consistent with partial small bowel obstruction in the region of the proximal ileum. This is felt to be related to adhesions given the transition zone anteriorly with multiple clustered loops of small bowel in this region. Diverticulosis without diverticulitis. No other focal abnormality is noted. Electronically Signed   By: MInez CatalinaM.D.   On: 12/20/2019 13:45   DG Chest Port 1 View  Result Date: 12/24/2019 CLINICAL DATA:  COVID-19 positive patient. EXAM: PORTABLE CHEST 1 VIEW COMPARISON:  PA and lateral chest 11/09/2017. FINDINGS: There is left basilar airspace disease consistent with pneumonia. The right lung is clear. Heart size is normal. No pneumothorax or pleural effusion. IMPRESSION: Left basilar pneumonia. Electronically Signed   By: TInge RiseM.D.   On: 12/24/2019 11:49   DG Abd Portable 1V  Result Date: 12/23/2019 CLINICAL DATA:  Small bowel obstruction EXAM: PORTABLE ABDOMEN - 1 VIEW COMPARISON:  12/22/2019 FINDINGS: NG tube is in the stomach. Dilated bowel loops in the abdomen include dominantly small bowel, but some prominent colon noted in the left lower quadrant. Bowel gas pattern not significantly changed since prior study. Oral contrast material seen within the colon. Prior cholecystectomy. IMPRESSION: Prominent small and large bowel within the abdomen and pelvis, not significantly changed. NG tube is in the stomach. Electronically Signed   By: KRolm BaptiseM.D.   On: 12/23/2019 08:53   DG Abd Portable 1V  Result Date: 12/22/2019 CLINICAL DATA:  Small-bowel obstruction EXAM: PORTABLE ABDOMEN - 1 VIEW COMPARISON:  12/21/2019; 12/20/2019; CT abdomen pelvis-12/20/2019 FINDINGS: Redemonstrated gaseous distension of several loops of small bowel with index loop of small bowel in the left mid hemiabdomen measures 5.1 cm in diameter.  There is however progressive gaseous distension of the colon, particularly the ascending and transverse colon. Nondiagnostic evaluation for pneumoperitoneum secondary to supine positioning and exclusion of the lower thorax. No pneumatosis or portal venous gas. Post cholecystectomy. Degenerative change of the lower lumbar spine  and bilateral hips is suspected though incompletely evaluated. IMPRESSION: Nonspecific gaseous distension of several loops of small bowel within left mid hemiabdomen though with progressive gaseous distension of colon, findings are nonspecific though could be seen the setting of ileus versus partial/incomplete small bowel obstruction. Electronically Signed   By: Sandi Mariscal M.D.   On: 12/22/2019 07:48   DG Abd Portable 1V-Small Bowel Obstruction Protocol-initial, 8 hr delay  Result Date: 12/21/2019 CLINICAL DATA:  Small bowel obstruction. Follow-up. EXAM: PORTABLE ABDOMEN - 1 VIEW COMPARISON:  12/20/2019 FINDINGS: Stable enteric tube with tip in the body of stomach. Changes of partial small bowel obstruction again noted with persistent dilated loops of small bowel within the left abdomen measuring up to 4.3 cm. No free air. IMPRESSION: Partial small bowel obstruction pattern, similar to previous exam. Electronically Signed   By: Kerby Moors M.D.   On: 12/21/2019 06:38   DG Abd Portable 1V-Small Bowel Protocol-Position Verification  Result Date: 12/20/2019 CLINICAL DATA:  NG tube placement. EXAM: PORTABLE ABDOMEN - 1 VIEW COMPARISON:  None. FINDINGS: Enteric tube appears appropriately positioned in the stomach. Surgical clips are seen in the RIGHT upper quadrant and LEFT upper quadrant. Mildly prominent small bowel loops noted within the central abdomen and LEFT upper quadrant, corresponding to the distended small bowel loops demonstrated on abdominal CT earlier today. IMPRESSION: Enteric tube appears appropriately positioned in the stomach. Electronically Signed   By: Franki Cabot  M.D.   On: 12/20/2019 17:01

## 2019-12-27 ENCOUNTER — Ambulatory Visit (HOSPITAL_COMMUNITY): Payer: Medicare Other

## 2019-12-27 MED ORDER — SUCRALFATE 1 GM/10ML PO SUSP: 1.0000 g | Freq: Three times a day (TID) | ORAL | 0 refills | Status: DC

## 2019-12-27 NOTE — Progress Notes (Signed)
Patient ID: [name], male   DOB: 03/10/1951, 69 y.o.   MRN: [id number] [name] Surgery Progress Note:   * No surgery found *  Subjective: Mental status is clear Objective: Vital signs in last 24 hours: Temp:  [97.6 F (36.4 C)-98.1 F (36.7 C)] 97.6 F (36.4 C) (02/20 0704) Pulse Rate:  [75-80] 80 (02/20 0704) Resp:  [18-20] 18 (02/20 0704) BP: (139-146)/(83-87) 143/83 (02/20 0704) SpO2:  [93 %-98 %] 93 % (02/20 0704) Weight:  [99.2 kg] 99.2 kg (02/19 1500)  Intake/Output from previous day: 02/19 0701 - 02/20 0700 In: 100 [IV Piggyback:100] Out: -  Intake/Output this shift: No intake/output data recorded.  Physical Exam: Work of breathing is normal.  Patient seen in 5W Covid unit.  Passing flatus and BMs   Lab Results:  Results for orders placed or performed during the hospital encounter of 12/20/19 (from the past 48 hour(s))  Basic metabolic panel     Status: Abnormal   Collection Time: 12/26/19  7:22 AM  Result Value Ref Range   Sodium 136 135 - 145 mmol/L   Potassium 4.1 3.5 - 5.1 mmol/L   Chloride 105 98 - 111 mmol/L   CO2 22 22 - 32 mmol/L   Glucose, Bld 105 (H) 70 - 99 mg/dL   BUN 10 8 - 23 mg/dL   Creatinine, Ser 0.65 0.61 - 1.24 mg/dL   Calcium 7.7 (L) 8.9 - 10.3 mg/dL   GFR calc non Af Amer >60 >60 mL/min   GFR calc Af Amer >60 >60 mL/min   Anion gap 9 5 - 15    Comment: Performed at [facility], [place] [address], [place], [place] [address]    Radiology/Results: No results found.  Anti-infectives: Anti-infectives (From admission, onward)   Start     Dose/Rate Route Frequency Ordered Stop   12/25/19 1000  remdesivir 100 mg in sodium chloride 0.9 % 100 mL IVPB     100 mg 200 mL/hr over 30 Minutes Intravenous Daily 12/24/19 1649 12/29/19 0959   12/24/19 1700  remdesivir 200 mg in sodium chloride 0.9% 250 mL IVPB     200 mg 580 mL/hr over 30 Minutes Intravenous Once 12/24/19 1649 12/24/19 1750      Assessment/Plan: Problem  List: Patient Active Problem List   Diagnosis Date Noted  . COVID-19 virus infection 12/24/2019  . SBO (small bowel obstruction) ([place]) 12/20/2019  . Multifocal pneumonia 09/12/2017  . Enteritis 09/12/2017  . Hyponatremia 09/12/2017  . CAP (community acquired pneumonia) 09/12/2017  . Acute sinusitis 07/06/2017  . Hyperglycemia 01/18/2017  . Preventative health care 01/13/2016  . Absolute anemia 10/15/2015  . Cervical spondylosis, treated by Dr. Gladstone Lighter, [facility] 06/12/2013  . Ventral hernia 03/04/2013  . Anxiety and depression, followed by Dr. [name] in Psych 11/19/2012  . Hiatal hernia, followed by Dr. Hassell Done ([place]) and Dr. Olevia Perches (GI) 11/19/2012  . Other testicular hypofunction, followed by Dr. Karsten Ro in Urology 10/15/2012  . Trigeminal neuralgia, s/p NSU, followed by Dr. Vertell Limber 10/15/2012  . BPH (benign prostatic hyperplasia), followed by Dr. Karsten Ro 07/26/2011  . Rosacea 09/02/2010  . COLONIC POLYPS, HYPERPLASTIC, HX OF 12/13/2009  . Obstructive chronic bronchitis without exacerbation, followed by Dr. [name] 03/04/2009  . Allergic rhinitis 04/03/2008  . Essential hypertension 05/08/2007  . GERD followed by Dr. Olevia Perches 05/08/2007    For discharge today after Remdesivir infusion. * No surgery found *    LOS: 7 days   Matt B. Hassell Done, MD, FACS  [facility] [phone] beeper [phone]  12/27/2019 11:42 AM

## 2019-12-27 NOTE — Progress Notes (Signed)
[name] to be D/C'd Home per MD order. Discussed with the patient and all questions fully answered.  Allergies as of 12/27/2019      Reactions   Dilantin [phenytoin]    bradycardia   Dilaudid [hydromorphone Hcl]    Ace Inhibitors Cough   Sulfa Antibiotics    hives   Augmentin [amoxicillin-pot Clavulanate] Nausea And Vomiting   Codeine Nausea And Vomiting      Medication List    STOP taking these medications   sucralfate 1 g tablet Commonly known as: CARAFATE Replaced by: sucralfate 1 GM/10ML suspension     TAKE these medications   acetaminophen 325 MG tablet Commonly known as: TYLENOL Take 2 tablets (650 mg total) by mouth every 6 (six) hours as needed for mild pain, moderate pain or fever.   Align 4 MG Caps Take by mouth daily.   ALPRAZolam 1 MG tablet Commonly known as: XANAX Take 1 mg by mouth at bedtime as needed for anxiety.   baclofen 10 MG tablet Commonly known as: LIORESAL Take 10 mg by mouth daily as needed for muscle spasms.   carbamazepine 100 MG 12 hr tablet Commonly known as: TEGRETOL XR Take 100 mg by mouth 3 (three) times daily.   cholecalciferol 25 MCG (1000 UNIT) tablet Commonly known as: VITAMIN D3 Take 1,000 Units by mouth daily.   Dexilant 60 MG capsule Generic drug: dexlansoprazole TAKE 1 CAPSULE BY MOUTH DAILY   esomeprazole 40 MG capsule Commonly known as: NEXIUM Take 40 mg by mouth daily at 12 noon.   gabapentin 400 MG capsule Commonly known as: NEURONTIN Take 400 mg by mouth 4 (four) times daily.   losartan-hydrochlorothiazide 50-12.5 MG tablet Commonly known as: HYZAAR Take 1 tablet by mouth daily.   pantoprazole 40 MG tablet Commonly known as: PROTONIX Take 1 tablet (40 mg total) by mouth daily. 30 minutes before breakfast   polyethylene glycol powder 17 GM/SCOOP powder Commonly known as: GLYCOLAX/MIRALAX DISSOLVE 1 CAPFUL IN LIQUID EVERY DAY AS NEEDED FOR CONSTIPATION   ranitidine 300 MG tablet Commonly  known as: ZANTAC Take 1 tablet (300 mg total) by mouth at bedtime. What changed:   when to take this  reasons to take this   sucralfate 1 GM/10ML suspension Commonly known as: CARAFATE Take 10 mLs (1 g total) by mouth 4 (four) times daily -  with meals and at bedtime. Replaces: sucralfate 1 g tablet       VVS, Skin clean, dry and intact without evidence of skin break down, no evidence of skin tears noted.  IV catheter discontinued intact. Site without signs and symptoms of complications. Dressing and pressure applied.  An After Visit Summary was printed and given to the patient.  Patient escorted via [place], and D/C home via private auto.  Ascencion Dike  12/27/2019 1:17 PM

## 2019-12-27 NOTE — Progress Notes (Signed)
Consult -   PROGRESS NOTE                                                                                                                                                                                                             Patient Demographics:    [name], is a 69 y.o. male, DOB - 07-14-1951, FIE:[id number]  Outpatient Primary MD for the patient is [name], MD    LOS - 7  Admit date - 12/20/2019    Chief Complaint  Patient presents with  . Abdominal Pain       Brief Narrative - [name] is an 69 y.o. male with h/o COPD; IBS; HTN; HLD; anxiety/depression; pernicious anemia; and h/o Nissen fundoplication with multiple subsequent repairs and known slipped Nissen who presented on 2/13 with partial SBO from adhesions.  He was found to be COVID + but thought to be asymptomatic.  He reports a long h/o GI issues.  He came in for abdominal issues.  He denies respiratory issues.  He was remotely intubated and subsequently saw pulmonology in f/u and was told he has scarring in the bases of his lungs.  He is baffled about the COVID since no one else in his bubble has it and he has been asymptomatic from this standpoint.   Subjective:   Seen in bed trying to eat breakfast, denies any headache or chest pain, no shortness of breath or cough.  Says he still has some abdominal pains and aches from time to time.  Having bowel movements.    Assessment  & Plan :   1.   Acute Covid 19 Viral Pneumonitis during the ongoing 2020 Covid 19 Pandemic - he has mild disease at best, currently getting remdesivir, since it is started we can continue, offered him outpatient infusion but he refused as he lives far away.  Will monitor inflammatory markers, currently not on steroids and in no respiratory distress or oxygen demand hence will monitor.  Encouraged the patient to sit up in chair in the daytime use I-S and flutter valve  for pulmonary toiletry and then prone in bed when at night.  SpO2: 93 %  Recent Labs  Lab 12/20/19 1629 12/21/19 1111 12/24/19 1433 12/24/19 1457  CRP  --   --  1.6*  --   DDIMER  --   --  0.97*  --  FERRITIN  --   --  403*  --   PROCALCITON  --   --   --  <0.10  SARSCOV2NAA POSITIVE* POSITIVE*  --   --     Hepatic Function Latest Ref Rng & Units 12/20/2019 06/02/2019 01/24/2018  Total Protein 6.5 - 8.1 g/dL 7.2 6.6 7.2  Albumin 3.5 - 5.0 g/dL 4.0 4.1 4.7  AST 15 - 41 U/L _0 ALT 0 - 44 U/L _1 Alk Phosphatase 38 - 126 U/L 75 72 67  Total Bilirubin 0.3 - 1.2 mg/dL 0.7 0.6 0.7  Bilirubin, Direct 0.0 - 0.3 mg/dL - 0.1 0.2    2.  SBO.  Treatment per primary team which is general surgery.  Can DC IV fluids if oral intake is better.  He is having BMs, largely bedbound encouraged to sit up in chair and ambulate in the room.  Exam appears benign.  3.  Hypertension.  On combination of losartan HCTZ which will be continued.  4.  Anxiety.  Continue home meds.    Condition - Extremely Guarded  Family Communication  :  none  Code Status :  Full  Diet :   Diet Order            DIET SOFT Room service appropriate? Yes; Fluid consistency: Thin  Diet effective now               Disposition Plan  : Per primary team which is general surgery, medically cleared for discharge.  Consults  : Hospitalist consulting for general surgery  Procedures  :    PUD Prophylaxis :    DVT Prophylaxis  :  Lovenox    Lab Results  Component Value Date   PLT 85 (L) 12/25/2019    Inpatient Medications  Scheduled Meds: . bisacodyl  10 mg Rectal Once  . carbamazepine  100 mg Oral TID  . enoxaparin (LOVENOX) injection  40 mg Subcutaneous Q24H  . gabapentin  400 mg Oral QID  . hydrochlorothiazide  12.5 mg Oral Daily  . losartan  50 mg Oral Daily  . pantoprazole (PROTONIX) IV  40 mg Intravenous Daily  . sucralfate  1 g Oral TID WC & HS   Continuous Infusions: . remdesivir  100 mg in NS 100 mL 100 mg (12/26/19 0927)   PRN Meds:.acetaminophen, ALPRAZolam, alum & mag hydroxide-simeth, metoprolol tartrate, morphine injection, ondansetron **OR** ondansetron (ZOFRAN) IV, phenol, promethazine  Antibiotics  :    Anti-infectives (From admission, onward)   Start     Dose/Rate Route Frequency Ordered Stop   12/25/19 1000  remdesivir 100 mg in sodium chloride 0.9 % 100 mL IVPB     100 mg 200 mL/hr over 30 Minutes Intravenous Daily 12/24/19 1649 12/29/19 0959   12/24/19 1700  remdesivir 200 mg in sodium chloride 0.9% 250 mL IVPB     200 mg 580 mL/hr over 30 Minutes Intravenous Once 12/24/19 1649 12/24/19 1750       Time Spent in minutes  30   Lala Lund M.D on 12/27/2019 at 9:04 AM  To page go to [email or web address] - password [facility]  [facility] -  Office  [phone]  See all Orders from today for further details    Objective:   Vitals:   12/26/19 0559 12/26/19 1500 12/26/19 2113 12/27/19 0704  BP: (!) 143/93 139/86 (!) 146/87 (!) 143/83  Pulse: 77 77 75 80  Resp: _2 Temp: 98.3 F (36.8 C)  97.8 F (36.6 C) 98.1 F (36.7 C) 97.6 F (36.4 C)  TempSrc:  Axillary    SpO2: 96% 96% 98% 93%  Weight:  99.2 kg    Height:  _0  (1.803 m)      Wt Readings from Last 3 Encounters:  12/26/19 99.2 kg  01/02/19 103.3 kg  12/17/18 102.1 kg     Intake/Output Summary (Last 24 hours) at 12/27/2019 0904 Last data filed at 12/26/2019 0932 Gross per 24 hour  Intake 100 ml  Output --  Net 100 ml     Physical Exam  Awake Alert, No new F.N deficits, Normal affect [place].AT,PERRAL Supple Neck,No JVD, No cervical lymphadenopathy appriciated.  Symmetrical Chest wall movement, Good air movement bilaterally, CTAB RRR,No Gallops, Rubs or new Murmurs, No Parasternal Heave +ve B.Sounds, Abd Soft, No tenderness, No organomegaly appriciated, No rebound - guarding or rigidity. No Cyanosis, Clubbing or edema, No new Rash or bruise    Data Review:     CBC Recent Labs  Lab 12/20/19 1113 12/21/19 0340 12/23/19 0409 12/25/19 0500  WBC 7.9 10.2 5.7 5.4  HGB 18.2* 17.5* 16.4 15.5  HCT 53.4* 52.1* 49.1 45.1  PLT 144* 134* 97* 85*  MCV 89.6 90.9 91.8 89.5  MCH 30.5 30.5 30.7 30.8  MCHC 34.1 33.6 33.4 34.4  RDW 12.6 13.0 13.2 13.1    Chemistries  Recent Labs  Lab 12/20/19 1113 12/20/19 1113 12/21/19 0340 12/23/19 0409 12/24/19 0526 12/25/19 0500 12/26/19 0722  NA 134*   < > 135 138 135 137 136  K 4.5   < > 4.1 3.3* 3.3* 3.8 4.1  CL 93*   < > 93* 100 98 100 105  CO2 31   < > _1 GLUCOSE 118*   < > 130* 108* 113* 108* 105*  BUN 7*   < > _2 CREATININE 0.97   < > 0.98 0.88 0.79 0.73 0.65  CALCIUM 9.2   < > 8.4* 8.0* 8.4* 8.0* 7.7*  MG  --   --   --  1.8 1.8 1.7  --   AST 20  --   --   --   --   --   --   ALT 13  --   --   --   --   --   --   ALKPHOS 75  --   --   --   --   --   --   BILITOT 0.7  --   --   --   --   --   --    < > = values in this interval not displayed.   ------------------------------------------------------------------------------------------------------------------ No results for input(s): CHOL, HDL, LDLCALC, TRIG, CHOLHDL, LDLDIRECT in the last 72 hours.  Lab Results  Component Value Date   HGBA1C 5.4 06/02/2019   ------------------------------------------------------------------------------------------------------------------ No results for input(s): TSH, T4TOTAL, T3FREE, THYROIDAB in the last 72 hours.  Invalid input(s): FREET3  Cardiac Enzymes No results for input(s): CKMB, TROPONINI, MYOGLOBIN in the last 168 hours.  Invalid input(s): CK ------------------------------------------------------------------------------------------------------------------ No results found for: BNP  Micro Results Recent Results (from the past 240 hour(s))  SARS CORONAVIRUS 2 (TAT 6-24 HRS) Nasopharyngeal Nasopharyngeal Swab     Status: Abnormal   Collection Time: 12/20/19  4:29  PM   Specimen: Nasopharyngeal Swab  Result Value Ref Range Status   SARS Coronavirus 2 POSITIVE (A) NEGATIVE Final    Comment: RESULT CALLED TO, READ  BACK BY AND VERIFIED WITH: C. HARTGROVE,RN 2246 12/20/2019 T. TYSOR (NOTE) SARS-CoV-2 target nucleic acids are DETECTED. The SARS-CoV-2 RNA is generally detectable in upper and lower respiratory specimens during the acute phase of infection. Positive results are indicative of the presence of SARS-CoV-2 RNA. Clinical correlation with patient history and other diagnostic information is  necessary to determine patient infection status. Positive results do not rule out bacterial infection or co-infection with other viruses.  The expected result is Negative. Fact Sheet for Patients: SugarRoll.be Fact Sheet for Healthcare Providers: https://www.woods-mathews.com/ This test is not yet approved or cleared by the [place] FDA and  has been authorized for detection and/or diagnosis of SARS-CoV-2 by FDA under an Emergency Use Authorization (EUA). This EUA will remain  in effect (meaning this test can be used) fo r the duration of the COVID-19 declaration under Section 564(b)(1) of the Act, 21 U.S.C. section 360bbb-3(b)(1), unless the authorization is terminated or revoked sooner. Performed at [facility], [place] [address] [place], [place] [address]   SARS CORONAVIRUS 2 (TAT 6-24 HRS) Nasopharyngeal Nasopharyngeal Swab     Status: Abnormal   Collection Time: 12/21/19 11:11 AM   Specimen: Nasopharyngeal Swab  Result Value Ref Range Status   SARS Coronavirus 2 POSITIVE (A) NEGATIVE Final    Comment: RESULT CALLED TO, READ BACK BY AND VERIFIED WITH: Alphonsus Sias RN._0  ON 2.14.21 BY TCALDWELL MT. (NOTE) SARS-CoV-2 target nucleic acids are DETECTED. The SARS-CoV-2 RNA is generally detectable in upper and lower respiratory specimens during the acute phase of infection. Positive results are  indicative of the presence of SARS-CoV-2 RNA. Clinical correlation with patient history and other diagnostic information is  necessary to determine patient infection status. Positive results do not rule out bacterial infection or co-infection with other viruses.  The expected result is Negative. Fact Sheet for Patients: SugarRoll.be Fact Sheet for Healthcare Providers: https://www.woods-mathews.com/ This test is not yet approved or cleared by the [place] FDA and  has been authorized for detection and/or diagnosis of SARS-CoV-2 by FDA under an Emergency Use Authorization (EUA). This EUA will remain  in effect (meaning this test can be  used) for the duration of the COVID-19 declaration under Section 564(b)(1) of the Act, 21 U.S.C. section 360bbb-3(b)(1), unless the authorization is terminated or revoked sooner. Performed at [facility], [place] [address] [place], [place] [address]     Radiology Reports CT ABDOMEN PELVIS W CONTRAST  Result Date: 12/20/2019 CLINICAL DATA:  Lower abdominal pain for 2 days EXAM: CT ABDOMEN AND PELVIS WITH CONTRAST TECHNIQUE: Multidetector CT imaging of the abdomen and pelvis was performed using the standard protocol following bolus administration of intravenous contrast. CONTRAST:  117m OMNIPAQUE IOHEXOL 300 MG/ML  SOLN COMPARISON:  10/11/2017 FINDINGS: Lower chest: No acute abnormality. Hepatobiliary: No focal liver abnormality is seen. Status post cholecystectomy. No biliary dilatation. Pancreas: Unremarkable. No pancreatic ductal dilatation or surrounding inflammatory changes. Spleen: Normal in size without focal abnormality. Adrenals/Urinary Tract: Adrenal glands are within normal limits. Kidneys demonstrate a normal enhancement pattern. No renal calculi are seen. Small renal cysts are noted. No obstructive changes are seen. The bladder is partially distended. Stomach/Bowel: Scattered diverticular change of  the colon is noted without evidence of diverticulitis. The appendix is within normal limits. Multiple dilated loops of small bowel are noted. There is a transition zone in the anterior abdomen beneath areas of prior ventral hernia repair consistent with adhesions. The more distal small bowel is decompressed and within normal  limits. Some mild free fluid is noted likely reactive in this area. Laxity of the anterior abdominal wall is seen without definitive herniation. Vascular/Lymphatic: Aortic atherosclerosis. No enlarged abdominal or pelvic lymph nodes. Reproductive: Prostate is enlarged in size. Other: Laxity of the anterior abdominal wall is noted without true herniation. Mild free fluid is noted as described above. Musculoskeletal: No fracture is seen. IMPRESSION: Changes consistent with partial small bowel obstruction in the region of the proximal ileum. This is felt to be related to adhesions given the transition zone anteriorly with multiple clustered loops of small bowel in this region. Diverticulosis without diverticulitis. No other focal abnormality is noted. Electronically Signed   By: Inez Catalina M.D.   On: 12/20/2019 13:45   DG Chest Port 1 View  Result Date: 12/24/2019 CLINICAL DATA:  COVID-19 positive patient. EXAM: PORTABLE CHEST 1 VIEW COMPARISON:  PA and lateral chest 11/09/2017. FINDINGS: There is left basilar airspace disease consistent with pneumonia. The right lung is clear. Heart size is normal. No pneumothorax or pleural effusion. IMPRESSION: Left basilar pneumonia. Electronically Signed   By: Inge Rise M.D.   On: 12/24/2019 11:49   DG Abd Portable 1V  Result Date: 12/23/2019 CLINICAL DATA:  Small bowel obstruction EXAM: PORTABLE ABDOMEN - 1 VIEW COMPARISON:  12/22/2019 FINDINGS: NG tube is in the stomach. Dilated bowel loops in the abdomen include dominantly small bowel, but some prominent colon noted in the left lower quadrant. Bowel gas pattern not significantly changed  since prior study. Oral contrast material seen within the colon. Prior cholecystectomy. IMPRESSION: Prominent small and large bowel within the abdomen and pelvis, not significantly changed. NG tube is in the stomach. Electronically Signed   By: Rolm Baptise M.D.   On: 12/23/2019 08:53   DG Abd Portable 1V  Result Date: 12/22/2019 CLINICAL DATA:  Small-bowel obstruction EXAM: PORTABLE ABDOMEN - 1 VIEW COMPARISON:  12/21/2019; 12/20/2019; CT abdomen pelvis-12/20/2019 FINDINGS: Redemonstrated gaseous distension of several loops of small bowel with index loop of small bowel in the left mid hemiabdomen measures 5.1 cm in diameter. There is however progressive gaseous distension of the colon, particularly the ascending and transverse colon. Nondiagnostic evaluation for pneumoperitoneum secondary to supine positioning and exclusion of the lower thorax. No pneumatosis or portal venous gas. Post cholecystectomy. Degenerative change of the lower lumbar spine and bilateral hips is suspected though incompletely evaluated. IMPRESSION: Nonspecific gaseous distension of several loops of small bowel within left mid hemiabdomen though with progressive gaseous distension of colon, findings are nonspecific though could be seen the setting of ileus versus partial/incomplete small bowel obstruction. Electronically Signed   By: Sandi Mariscal M.D.   On: 12/22/2019 07:48   DG Abd Portable 1V-Small Bowel Obstruction Protocol-initial, 8 hr delay  Result Date: 12/21/2019 CLINICAL DATA:  Small bowel obstruction. Follow-up. EXAM: PORTABLE ABDOMEN - 1 VIEW COMPARISON:  12/20/2019 FINDINGS: Stable enteric tube with tip in the body of stomach. Changes of partial small bowel obstruction again noted with persistent dilated loops of small bowel within the left abdomen measuring up to 4.3 cm. No free air. IMPRESSION: Partial small bowel obstruction pattern, similar to previous exam. Electronically Signed   By: Kerby Moors M.D.   On:  12/21/2019 06:38   DG Abd Portable 1V-Small Bowel Protocol-Position Verification  Result Date: 12/20/2019 CLINICAL DATA:  NG tube placement. EXAM: PORTABLE ABDOMEN - 1 VIEW COMPARISON:  None. FINDINGS: Enteric tube appears appropriately positioned in the stomach. Surgical clips are seen in the RIGHT upper quadrant and  LEFT upper quadrant. Mildly prominent small bowel loops noted within the central abdomen and LEFT upper quadrant, corresponding to the distended small bowel loops demonstrated on abdominal CT earlier today. IMPRESSION: Enteric tube appears appropriately positioned in the stomach. Electronically Signed   By: Franki Cabot M.D.   On: 12/20/2019 17:01

## 2019-12-27 NOTE — Care Management (Signed)
I spoke w patient and notified him of his appointment at [facility] for IV Remdesivir, and notified nurse to administer today's dose prior to DC. Nurse informed me that he may stay to complete doses due to difficulty w getting to clinic. Transportation center not open on the weekends to coordination assistance with transportation to appointment.

## 2019-12-28 ENCOUNTER — Encounter (HOSPITAL_COMMUNITY): Payer: Self-pay

## 2019-12-28 ENCOUNTER — Ambulatory Visit (HOSPITAL_COMMUNITY): Payer: Medicare Other

## 2019-12-28 ENCOUNTER — Ambulatory Visit (HOSPITAL_COMMUNITY)
Admission: RE | Admit: 2019-12-28 | Discharge: 2019-12-28 | Disposition: A | Discharge status: Home / Self Care | Payer: Medicare Other | Source: Ambulatory Visit | Attending: Pulmonary Disease | Admitting: Pulmonary Disease

## 2019-12-28 DIAGNOSIS — J1282 Pneumonia due to coronavirus disease 2019: Secondary | ICD-10-CM | POA: Insufficient documentation

## 2019-12-28 DIAGNOSIS — U071 COVID-19: Secondary | ICD-10-CM | POA: Insufficient documentation

## 2019-12-28 MED ORDER — ALBUTEROL SULFATE HFA 108 (90 BASE) MCG/ACT IN AERS: 2.0000 | INHALATION_SPRAY | Freq: Once | RESPIRATORY_TRACT | Status: DC | PRN

## 2019-12-28 MED ORDER — FAMOTIDINE IN NACL 20-0.9 MG/50ML-% IV SOLN: 20.0000 mg | Freq: Once | INTRAVENOUS | Status: DC | PRN

## 2019-12-28 MED ORDER — METHYLPREDNISOLONE SODIUM SUCC 125 MG IJ SOLR: 125.0000 mg | Freq: Once | INTRAMUSCULAR | Status: DC | PRN

## 2019-12-28 MED ORDER — DIPHENHYDRAMINE HCL 50 MG/ML IJ SOLN: 50.0000 mg | Freq: Once | INTRAMUSCULAR | Status: DC | PRN

## 2019-12-28 MED ORDER — SODIUM CHLORIDE 0.9 % IV SOLN: INTRAVENOUS | Status: DC | PRN

## 2019-12-28 MED ORDER — SODIUM CHLORIDE 0.9 % IV SOLN
100.0000 mg | Freq: Once | INTRAVENOUS | Status: AC
  Administered 2019-12-28: 10:00:00 100 mg via INTRAVENOUS
  Filled 2019-12-28: qty 20

## 2019-12-28 MED ORDER — EPINEPHRINE 0.3 MG/0.3ML IJ SOAJ: 0.3000 mg | Freq: Once | INTRAMUSCULAR | Status: DC | PRN

## 2019-12-28 NOTE — Progress Notes (Signed)
  Diagnosis: COVID-19  Physician: [name]  Procedure: Covid Infusion Clinic Med: remdesivir infusion.  Complications: No immediate complications noted.  Discharge: Discharged home   Baxter Hire 12/28/2019

## 2019-12-29 ENCOUNTER — Telehealth: Payer: Self-pay | Admitting: *Deleted

## 2019-12-29 NOTE — Telephone Encounter (Signed)
Transition Care Management Follow-up Telephone Call   Date discharged? 12/27/19   How have you been since you were released from the hospital? Spoke w/wife she states husband is doing fine   Do you understand why you were in the hospital? YES, she states she took him because of the blockage in his bowel, not sure how test for Covid was positive. She states they had taken a test a week before and both hers, his and grand daughter was Negative. She states that he hadn't had any symptoms. He is back to his normal self since his stomach not bothering his anymore   Do you understand the discharge instructions? YES   Where were you discharged to? Home   Items Reviewed:  Medications reviewed: YES, she states he completed his last IV inj for the covid on yesterday. All other medications are still the same  Allergies reviewed: YES  Dietary changes reviewed: YES  Referrals reviewed: No referral recommeded   Functional Questionnaire:   Activities of Daily Living (ADLs):   She states he are independent in the following: ambulation, bathing and hygiene, feeding, continence, grooming, toileting and dressing States he doesn't require assistance    Any transportation issues/concerns?: NO   Any patient concerns? NO   Confirmed importance and date/time of follow-up visits scheduled YES, (Virtual) 01/07/20  Provider Appointment booked with Dr. [name]  Confirmed with patient if condition begins to worsen call PCP or go to the ER.  Patient was given the office number and encouraged to call back with question or concerns.  : NO

## 2020-01-01 ENCOUNTER — Other Ambulatory Visit: Payer: Self-pay

## 2020-01-01 MED ORDER — DEXILANT 60 MG PO CPDR: 1.0000 | DELAYED_RELEASE_CAPSULE | Freq: Every day | ORAL | 0 refills | Status: DC

## 2020-01-01 NOTE — Telephone Encounter (Signed)
Pt returning call and has several additional questions.  Pt states that she called 12/29/19 but I don't see a message.

## 2020-01-01 NOTE — Telephone Encounter (Signed)
Spoke with the spouse. Patient was discharged from the hospital. He was hospitalized for a small bowel obstruction on 12/20/19. Positive COVID 19 test on 12/21/19.  He is home now. She is asking for samples of Dexilant 60 mg if possible. Samples obtained. She will come pick them up.

## 2020-01-07 ENCOUNTER — Ambulatory Visit (INDEPENDENT_AMBULATORY_CARE_PROVIDER_SITE_OTHER): Payer: Medicare Other | Admitting: Internal Medicine

## 2020-01-07 DIAGNOSIS — Z8701 Personal history of pneumonia (recurrent): Secondary | ICD-10-CM

## 2020-01-07 DIAGNOSIS — Z8719 Personal history of other diseases of the digestive system: Secondary | ICD-10-CM | POA: Diagnosis not present

## 2020-01-07 DIAGNOSIS — Z8616 Personal history of COVID-19: Secondary | ICD-10-CM

## 2020-01-07 DIAGNOSIS — R339 Retention of urine, unspecified: Secondary | ICD-10-CM

## 2020-01-07 DIAGNOSIS — U071 COVID-19: Secondary | ICD-10-CM

## 2020-01-07 DIAGNOSIS — K56609 Unspecified intestinal obstruction, unspecified as to partial versus complete obstruction: Secondary | ICD-10-CM

## 2020-01-07 NOTE — Progress Notes (Signed)
Patient ID: [name], male   DOB: 1950-11-23, 69 y.o.   MRN: [id number]  Virtual Visit via Video Note  I connected with Novel Glassco Matsuoka on 01/07/20 at 10:00 AM EST by a video enabled telemedicine application and verified that I am speaking with the correct person using two identifiers.  Location: Patient: at home Provider: at office   I discussed the limitations of evaluation and management by telemedicine and the availability of in person appointments. The patient expressed understanding and agreed to proceed.  History of Present Illness: Here to f/u s/p hospn feb 13 - feb 22 with PSBO after presented to [facility] with 24 hrs abdominal pain, nausea, and distention.  Workup showed PSBO.  Patient was admitted and started on small bowel obstruction protocol. He was incidentally found to be COVID positive on admission testing.  He was treated conservatively and progressed on the SB protocol with advancing diet.  Course was complicated by COVID pna. Still has low appetite, low energy and has f/u planned for GI mar 17.  Also has some urinary retention persistent post d/c, with urology f/u planned for June 2020.  No other new complaints Past Medical History:  Diagnosis Date  . Allergic rhinitis   . Anemia, pernicious   . Anxiety and depression    sees Dr. [name]  . Arthritis   . Catheter-associated urinary tract infection ([place])   . Diverticulosis of colon   . Elevated PSA    and hypogonadism, sees urologist  . GERD (gastroesophageal reflux disease)    hiatal hernia  . Hx of blood transfusion reaction 1999  . Hyperlipidemia   . Hyperparathyroidism ([place])   . Hyperplastic colon polyp   . Hypertension    acei causes cough  . IBS (irritable bowel syndrome)   . Nephrolithiasis   . Obstructive chronic bronchitis without exacerbation, followed by Dr. [name] 03/04/2009   ONO RA  Normal 07/28/11 6 min walk >544m no desat PFTs 08/08/11: DLCO 70% TLC 80%  FeV1 80%   Fef 25 75 56% with  significant improvement after BD   . Peritonitis ([place])    after surgery in 1999  . Pneumonia   . PONV (postoperative nausea and vomiting)   . Rectal fissure   . Trigeminal neuralgia    has facial asymetry  . Ventral hernia    Past Surgical History:  Procedure Laterality Date  . ABDOMINAL SURGERY     Multiple  . BRAIN SURGERY  01/2011   Trigeminal nerve/Dr Vertell Limber  . CHOLECYSTECTOMY  1981  . DENTAL SURGERY  2019   tooth extraction, root canal  . HAND SURGERY  1973   Left  . HERNIA REPAIR  1999  . KNEE SURGERY  1997   Left  . LITHOTRIPSY     Right  . NISSEN FUNDOPLICATION  [id number]    complicated by gastric perforation, peritonitis, ventral nernia   . RHIZOTOMY Right 11/17/2014   Procedure: RHIZOTOMY TO RIGHT V2,V3;  Surgeon: Erline Levine, MD;  Location: [place] [facility];  Service: Neurosurgery;  Laterality: Right;  right  . RHIZOTOMY Right 12/22/2014   Procedure: Right V2 and V3 trigeminal rhysolysis;  Surgeon: Erline Levine, MD;  Location: [place] [facility];  Service: Neurosurgery;  Laterality: Right;  Right V2 and V3 trigeminal rhysolysis  . RHIZOTOMY W/ RADIOFREQUENCY ABLATION  08/2017   Dr [name]  . UPPER GASTROINTESTINAL ENDOSCOPY    . VENTRAL HERNIA REPAIR  2004    reports that he quit smoking about 23 years ago.  His smoking use included cigarettes. He has a 20.00 pack-year smoking history. He quit smokeless tobacco use about 36 years ago.  His smokeless tobacco use included chew. He reports that he does not drink alcohol or use drugs. family history includes Coronary artery disease in his mother; Diabetes in his brother, mother, and sister; Heart disease in his brother and brother; Hypertension in his brother. Allergies  Allergen Reactions  . Dilantin [Phenytoin]     bradycardia  . Dilaudid [Hydromorphone Hcl]   . Ace Inhibitors Cough  . Sulfa Antibiotics     hives  . Augmentin [Amoxicillin-Pot Clavulanate] Nausea And Vomiting  . Codeine Nausea And Vomiting   Current Outpatient  Medications on File Prior to Visit  Medication Sig Dispense Refill  . acetaminophen (TYLENOL) 325 MG tablet Take 2 tablets (650 mg total) by mouth every 6 (six) hours as needed for mild pain, moderate pain or fever.    . ALPRAZolam (XANAX) 1 MG tablet Take 1 mg by mouth at bedtime as needed for anxiety.     . baclofen (LIORESAL) 10 MG tablet Take 10 mg by mouth daily as needed for muscle spasms.    . carbamazepine (TEGRETOL XR) 100 MG 12 hr tablet Take 100 mg by mouth 3 (three) times daily.    . cholecalciferol (VITAMIN D3) 25 MCG (1000 UNIT) tablet Take 1,000 Units by mouth daily.    Marland Kitchen dexlansoprazole (DEXILANT) 60 MG capsule Take 1 capsule (60 mg total) by mouth daily. 30 capsule 0  . esomeprazole (NEXIUM) 40 MG capsule Take 40 mg by mouth daily at 12 noon.    . gabapentin (NEURONTIN) 400 MG capsule Take 400 mg by mouth 4 (four) times daily.     Marland Kitchen losartan-hydrochlorothiazide (HYZAAR) 50-12.5 MG tablet Take 1 tablet by mouth daily. 90 tablet 3  . pantoprazole (PROTONIX) 40 MG tablet Take 1 tablet (40 mg total) by mouth daily. 30 minutes before breakfast (Patient not taking: Reported on 12/20/2019) 90 tablet 3  . polyethylene glycol powder (GLYCOLAX/MIRALAX) powder DISSOLVE 1 CAPFUL IN LIQUID EVERY DAY AS NEEDED FOR CONSTIPATION 527 g 2  . Probiotic Product (ALIGN) 4 MG CAPS Take by mouth daily.    . ranitidine (ZANTAC) 300 MG tablet Take 1 tablet (300 mg total) by mouth at bedtime. (Patient taking differently: Take 300 mg by mouth as needed. ) 90 tablet 3  . sucralfate (CARAFATE) 1 GM/10ML suspension Take 10 mLs (1 g total) by mouth 4 (four) times daily -  with meals and at bedtime. 420 mL 0   No current facility-administered medications on file prior to visit.    Observations/Objective: Alert, NAD, appropriate mood and affect, resps normal, cn 2-12 intact, moves all 4s, no visible rash or swelling Lab Results  Component Value Date   WBC 5.4 12/25/2019   HGB 15.5 12/25/2019   HCT 45.1  12/25/2019   PLT 85 (L) 12/25/2019   GLUCOSE 105 (H) 12/26/2019   CHOL 155 06/02/2019   TRIG 134.0 06/02/2019   HDL 52.90 06/02/2019   LDLCALC 75 06/02/2019   ALT 13 12/20/2019   AST 20 12/20/2019   NA 136 12/26/2019   K 4.1 12/26/2019   CL 105 12/26/2019   CREATININE 0.65 12/26/2019   BUN 10 12/26/2019   CO2 22 12/26/2019   TSH 2.74 06/02/2019   PSA 8.12 (H) 06/02/2019   HGBA1C 5.4 06/02/2019   Assessment and Plan: See notes  Follow Up Instructions: See notes   I discussed the assessment and treatment  plan with the patient. The patient was provided an opportunity to ask questions and all were answered. The patient agreed with the plan and demonstrated an understanding of the instructions.   The patient was advised to call back or seek an in-person evaluation if the symptoms worsen or if the condition fails to improve as anticipated.  Cathlean Cower, MD

## 2020-01-11 ENCOUNTER — Encounter: Payer: Self-pay | Admitting: Internal Medicine

## 2020-01-11 DIAGNOSIS — R339 Retention of urine, unspecified: Secondary | ICD-10-CM | POA: Insufficient documentation

## 2020-01-11 NOTE — Assessment & Plan Note (Signed)
Ok for flomax bid, f/u urology as planned

## 2020-01-11 NOTE — Patient Instructions (Signed)
Ok to increase the flomax to twice per day

## 2020-01-11 NOTE — Assessment & Plan Note (Signed)
Resolved, for GI f/u as planned

## 2020-01-11 NOTE — Assessment & Plan Note (Signed)
With pna, resolved,  to f/u any worsening symptoms or concerns

## 2020-01-21 ENCOUNTER — Ambulatory Visit (INDEPENDENT_AMBULATORY_CARE_PROVIDER_SITE_OTHER): Payer: Medicare Other | Admitting: Gastroenterology

## 2020-01-21 ENCOUNTER — Encounter: Payer: Self-pay | Admitting: Gastroenterology

## 2020-01-21 VITALS — BP 140/80 | HR 68 | Temp 98.2°F | Ht 71.0 in | Wt 214.6 lb

## 2020-01-21 DIAGNOSIS — K219 Gastro-esophageal reflux disease without esophagitis: Secondary | ICD-10-CM

## 2020-01-21 DIAGNOSIS — K449 Diaphragmatic hernia without obstruction or gangrene: Secondary | ICD-10-CM

## 2020-01-21 DIAGNOSIS — Z8719 Personal history of other diseases of the digestive system: Secondary | ICD-10-CM | POA: Diagnosis not present

## 2020-01-21 MED ORDER — DEXILANT 60 MG PO CPDR: 1.0000 | DELAYED_RELEASE_CAPSULE | Freq: Every day | ORAL | 0 refills | Status: DC

## 2020-01-21 NOTE — Progress Notes (Signed)
[name]    [id number]    February 23, 1951  Primary Care Physician:[name], [name], MD  Referring Physician: [name], MD [id number] [place],  [place] [address]   Chief complaint: GERD, nausea, excessive belching  HPI: 69 year old male with chronic GERD s/p Nissen application complicated by postop bleed with recurrent hernia  Recently hospitalized with SBO and also incidental positive Covid-19, had mild pneumonia on chest x-ray though he was asymptomatic  CT abd & pelvis with contrast 12/20/19: Multiple dilated loops of small bowel, transition zone in the anterior abd beneath areas of prior ventral hernia repair consistent with adheasions, distal small bowel is decompressed. Scattered diverticula.   Patient feels post hospitalization he has decreased appetite, intermittent nausea and also excessive belching. He is having bowel movements and is passing gas.  Intermittent abdominal cramping and discomfort.  Denies any melena or blood per rectum.  EGD December 07, 2017 with a 5 cm hiatal hernia and mild gastritis otherwise unremarkable exam  Colonoscopy January 2017 with removal of multiple adenomatous polyps X3 and one tubulovillous adenoma with the largest polyp >2 cm Colonoscopy April 2018 with removal of 2 small diminutive tubular adenomas and 1 hyperplastic polyp  Outpatient Encounter Medications as of 01/21/2020  Medication Sig  . acetaminophen (TYLENOL) 325 MG tablet Take 2 tablets (650 mg total) by mouth every 6 (six) hours as needed for mild pain, moderate pain or fever.  . ALPRAZolam (XANAX) 1 MG tablet Take 1 mg by mouth at bedtime as needed for anxiety.   . baclofen (LIORESAL) 10 MG tablet Take 10 mg by mouth daily as needed for muscle spasms.  . carbamazepine (TEGRETOL XR) 100 MG 12 hr tablet Take 100 mg by mouth 3 (three) times daily.  . cholecalciferol (VITAMIN D3) 25 MCG (1000 UNIT) tablet Take 1,000 Units by mouth daily.  Marland Kitchen dexlansoprazole  (DEXILANT) 60 MG capsule Take 1 capsule (60 mg total) by mouth daily.  Marland Kitchen esomeprazole (NEXIUM) 40 MG capsule Take 40 mg by mouth daily at 12 noon.  . gabapentin (NEURONTIN) 400 MG capsule Take 400 mg by mouth 4 (four) times daily.   Marland Kitchen losartan-hydrochlorothiazide (HYZAAR) 50-12.5 MG tablet Take 1 tablet by mouth daily.  . pantoprazole (PROTONIX) 40 MG tablet Take 1 tablet (40 mg total) by mouth daily. 30 minutes before breakfast  . polyethylene glycol powder (GLYCOLAX/MIRALAX) powder DISSOLVE 1 CAPFUL IN LIQUID EVERY DAY AS NEEDED FOR CONSTIPATION  . Probiotic Product (ALIGN) 4 MG CAPS Take by mouth daily.  . sucralfate (CARAFATE) 1 GM/10ML suspension Take 10 mLs (1 g total) by mouth 4 (four) times daily -  with meals and at bedtime.  . Zinc 220 (50 Zn) MG CAPS Take 1 capsule by mouth daily.  . [DISCONTINUED] ranitidine (ZANTAC) 300 MG tablet Take 1 tablet (300 mg total) by mouth at bedtime. (Patient taking differently: Take 300 mg by mouth as needed. )   No facility-administered encounter medications on file as of 01/21/2020.    Allergies as of 01/21/2020 - Review Complete 01/21/2020  Allergen Reaction Noted  . Dilantin [phenytoin]  07/27/2017  . Dilaudid [hydromorphone hcl]  09/12/2017  . Ace inhibitors Cough 04/02/2008  . Sulfa antibiotics  12/17/2014  . Augmentin [amoxicillin-pot clavulanate] Nausea And Vomiting 07/06/2017  . Codeine Nausea And Vomiting 11/10/2014    Past Medical History:  Diagnosis Date  . Allergic rhinitis   . Anemia, pernicious   . Anxiety and depression  sees Dr. [name]  . Arthritis   . Catheter-associated urinary tract infection ([place])   . Diverticulosis of colon   . Elevated PSA    and hypogonadism, sees urologist  . GERD (gastroesophageal reflux disease)    hiatal hernia  . Hx of blood transfusion reaction 1999  . Hyperlipidemia   . Hyperparathyroidism ([place])   . Hyperplastic colon polyp   . Hypertension    acei causes cough  . IBS (irritable bowel  syndrome)   . Nephrolithiasis   . Obstructive chronic bronchitis without exacerbation, followed by Dr. [name] 03/04/2009   ONO RA  Normal 07/28/11 6 min walk >514m no desat PFTs 08/08/11: DLCO 70% TLC 80%  FeV1 80%   Fef 25 75 56% with significant improvement after BD   . Peritonitis ([place])    after surgery in 1999  . Pneumonia   . PONV (postoperative nausea and vomiting)   . Rectal fissure   . Trigeminal neuralgia    has facial asymetry  . Ventral hernia     Past Surgical History:  Procedure Laterality Date  . ABDOMINAL SURGERY     Multiple  . BRAIN SURGERY  01/2011   Trigeminal nerve/Dr Vertell Limber  . CHOLECYSTECTOMY  1981  . DENTAL SURGERY  2019   tooth extraction, root canal  . HAND SURGERY  1973   Left  . HERNIA REPAIR  1999  . KNEE SURGERY  1997   Left  . LITHOTRIPSY     Right  . NISSEN FUNDOPLICATION  [id number]    complicated by gastric perforation, peritonitis, ventral nernia   . RHIZOTOMY Right 11/17/2014   Procedure: RHIZOTOMY TO RIGHT V2,V3;  Surgeon: Erline Levine, MD;  Location: [place] [facility];  Service: Neurosurgery;  Laterality: Right;  right  . RHIZOTOMY Right 12/22/2014   Procedure: Right V2 and V3 trigeminal rhysolysis;  Surgeon: Erline Levine, MD;  Location: [place] [facility];  Service: Neurosurgery;  Laterality: Right;  Right V2 and V3 trigeminal rhysolysis  . RHIZOTOMY W/ RADIOFREQUENCY ABLATION  08/2017   Dr [name]  . UPPER GASTROINTESTINAL ENDOSCOPY    . VENTRAL HERNIA REPAIR  2004    Family History  Problem Relation Age of Onset  . Diabetes Mother   . Coronary artery disease Mother        CABG  . Diabetes Sister   . Diabetes Brother   . Hypertension Brother   . Heart disease Brother   . Heart disease Brother   . Colon cancer Neg Hx   . Esophageal cancer Neg Hx   . Stomach cancer Neg Hx     Social History   Socioeconomic History  . Marital status: Married    Spouse name: Not on file  . Number of children: 2  . Years of education: Not on file  . Highest  education level: Not on file  Occupational History  . Occupation: Disabled - psych  Tobacco Use  . Smoking status: Former Smoker    Packs/day: 1.00    Years: 20.00    Pack years: 20.00    Types: Cigarettes    Quit date: 11/06/1996    Years since quitting: 23.2  . Smokeless tobacco: Former Systems developer    Types: [place] date: 11/07/1983  Substance and Sexual Activity  . Alcohol use: No  . Drug use: No  . Sexual activity: Yes    Partners: Female  Other Topics Concern  . Not on file  Social History Narrative   Daily Caffeine Use:  Yes         Social Determinants of Health   Financial Resource Strain:   . Difficulty of Paying Living Expenses:   Food Insecurity:   . Worried About Charity fundraiser in the Last Year:   . Arboriculturist in the Last Year:   Transportation Needs:   . Film/video editor (Medical):   Marland Kitchen Lack of Transportation (Non-Medical):   Physical Activity:   . Days of Exercise per Week:   . Minutes of Exercise per Session:   Stress:   . Feeling of Stress :   Social Connections:   . Frequency of Communication with Friends and Family:   . Frequency of Social Gatherings with Friends and Family:   . Attends Religious Services:   . Active Member of Clubs or Organizations:   . Attends Archivist Meetings:   Marland Kitchen Marital Status:   Intimate Partner Violence:   . Fear of Current or Ex-Partner:   . Emotionally Abused:   Marland Kitchen Physically Abused:   . Sexually Abused:       Review of systems:  All other review of systems negative except as mentioned in the HPI.   Physical Exam: Vitals:   01/21/20 1045  BP: 140/80  Pulse: 68  Temp: 98.2 F (36.8 C)   Body mass index is 29.93 kg/m. Gen:      No acute distress Abd:      + bowel sounds; soft, non-tender; no palpable masses, no distension Neuro: alert and oriented x 3 Psych: normal mood and affect  Data Reviewed:  Reviewed labs, radiology imaging, old records and pertinent past GI work up    Assessment and Plan/Recommendations:  69 year old male with history of chronic GERD s/p Nissen fundoplication complicated by postop leak with recurrent hernia persistent GERD symptoms  Insurance has denied Dexilant in the past but he has better response to it Will resend the prescription for Dexilant 60 mg daily and see if insurance will approve it Currently he is using Nexium and Protonix over-the-counter alternatively and he is aware that he should not be taking multiple PPI or more than twice daily.  Discussed potential side effects with long-term PPI use  Patient is interested in undergoing hiatal hernia repair but given history of significant complications with prior Nissen fundoplication, he is at high risk for potential complications.  Continue antireflux measures  Recent hospitalization with SBO likely secondary to adhesions at the site of ventral hernia repair. We will schedule for upper GI series with small bowel follow-through to exclude any intraluminal lesion or abnormality  Return in 3 months or sooner if needed  The patient was provided an opportunity to ask questions and all were answered. The patient agreed with the plan and demonstrated an understanding of the instructions.  Damaris Hippo , MD    CC: [name], MD

## 2020-01-21 NOTE — Patient Instructions (Signed)
If you are age 68 or older, your body mass index should be between 23-30. Your Body mass index is 29.93 kg/m. If this is out of the aforementioned range listed, please consider follow up with your Primary Care Provider.  If you are age 16 or younger, your body mass index should be between 19-25. Your Body mass index is 29.93 kg/m. If this is out of the aformentioned range listed, please consider follow up with your Primary Care Provider.   We have sent the following medications to your pharmacy for you to pick up at your convenience: Dexilant 60 mg  You have been scheduled for an Upper GI Series and Small Bowel Follow Thru at [facility]. Your appointment is on 02/04/20  at 10:30 am. Please arrive 15 minutes prior to your test for registration. Make certain not to have anything to eat or drink after midnight on the night before your test. If you need to reschedule, please contact radiology at [phone]. --------------------------------------------------------------------------------------------------------------- An upper GI series uses x rays to help diagnose problems of the upper GI tract, which includes the esophagus, stomach, and duodenum. The duodenum is the first part of the small intestine. An upper GI series is conducted by a radiology technologist or a radiologist--a doctor who specializes in x-ray imaging--at a hospital or outpatient center. While sitting or standing in front of an x-ray machine, the patient drinks barium liquid, which is often white and has a chalky consistency and taste. The barium liquid coats the lining of the upper GI tract and makes signs of disease show up more clearly on x rays. X-ray video, called fluoroscopy, is used to view the barium liquid moving through the esophagus, stomach, and duodenum. Additional x rays and fluoroscopy are performed while the patient lies on an x-ray table. To fully coat the upper GI tract with barium liquid, the technologist or  radiologist may press on the abdomen or ask the patient to change position. Patients hold still in various positions, allowing the technologist or radiologist to take x rays of the upper GI tract at different angles. If a technologist conducts the upper GI series, a radiologist will later examine the images to look for problems.  This test typically takes about 1 hour to complete --------------------------------------------------------------------------------------------------------------------------------------------- The Small Bowel Follow Thru examination is used to visualize the entire small bowel (intestines); specifically the connection between the small and large intestine. You will be positioned on a flat x-ray table and an image of your abdomen taken. Then the technologist will show the x-ray to the radiologist. The radiologist will instruct your technologist how much (1-2 cups) barium sulfate you will drink and when to begin taking the timed x-rays, usually 15-30 minutes after you begin drinking. Barium is a harmless substance that will highlight your small intestine by absorbing x-ray. The taste is chalky and it feels very heavy both in the cup and in your stomach.  After the first x-ray is taken and shown to the radiologist, he/she will determine when the next image is to be taken. This is repeated until the barium has reached the end of the small intestine and enters the beginning of the colon (cecum). At such time when the barium spills into the colon, you will be positioned on the x-ray table once again. The radiologist will use a fluoroscopic camera to take some detailed pictures of the connection between your small intestine and colon. The fluoroscope is an x-ray unit that works with a television/computer screen. The radiologist  will apply pressure to your abdomen with his/her hand and a lead glove, a plastic paddle, or a paddle with an inflated rubber balloon on the end. This is to spread apart  your loops of intestine so he/she can see all areas.   This test typically takes around 1 hour to complete.  Important Drink plenty of water (8-10 cups/day) for a few days following the procedure to avoid constipation and blockage. The barium will make your stools white for a few days. -------------------------------------------------------------------------------------------------------------------------------------------- Follow up in three months. Please call the office in a few weeks to schedule  Appointment as the schedule is not available at this time.  Thank you for choosing me and [facility].   Harl Bowie, MD

## 2020-01-22 ENCOUNTER — Telehealth: Payer: Self-pay

## 2020-01-22 NOTE — Telephone Encounter (Signed)
Looks like we should see in office first, thanks

## 2020-01-22 NOTE — Telephone Encounter (Signed)
New message   The patient is calling asking can Dr. [name] order blood work for him due to his C/o joint and calf are hurting

## 2020-01-22 NOTE — Telephone Encounter (Signed)
F/u  Appt on  3.19.201 @ 11:00am

## 2020-01-23 ENCOUNTER — Encounter: Payer: Self-pay | Admitting: Internal Medicine

## 2020-01-23 ENCOUNTER — Other Ambulatory Visit: Payer: Self-pay

## 2020-01-23 ENCOUNTER — Ambulatory Visit (INDEPENDENT_AMBULATORY_CARE_PROVIDER_SITE_OTHER): Payer: Medicare Other | Admitting: Internal Medicine

## 2020-01-23 VITALS — BP 118/76 | HR 64 | Temp 97.1°F | Ht 71.0 in | Wt 212.0 lb

## 2020-01-23 DIAGNOSIS — N320 Bladder-neck obstruction: Secondary | ICD-10-CM

## 2020-01-23 DIAGNOSIS — E559 Vitamin D deficiency, unspecified: Secondary | ICD-10-CM

## 2020-01-23 DIAGNOSIS — M79604 Pain in right leg: Secondary | ICD-10-CM | POA: Diagnosis not present

## 2020-01-23 DIAGNOSIS — R739 Hyperglycemia, unspecified: Secondary | ICD-10-CM

## 2020-01-23 DIAGNOSIS — I10 Essential (primary) hypertension: Secondary | ICD-10-CM

## 2020-01-23 DIAGNOSIS — E611 Iron deficiency: Secondary | ICD-10-CM

## 2020-01-23 DIAGNOSIS — E538 Deficiency of other specified B group vitamins: Secondary | ICD-10-CM

## 2020-01-23 DIAGNOSIS — E785 Hyperlipidemia, unspecified: Secondary | ICD-10-CM | POA: Diagnosis not present

## 2020-01-23 HISTORY — DX: Deficiency of other specified B group vitamins: E53.8

## 2020-01-23 LAB — LIPID PANEL
Cholesterol: 154 mg/dL (ref 0–200)
HDL: 45.6 mg/dL (ref >39.00)
LDL Cholesterol: 87 mg/dL (ref 0–99)
NonHDL: 108.05
Total CHOL/HDL Ratio: 3
Triglycerides: 105 mg/dL (ref 0.0–149.0)
VLDL: 21 mg/dL (ref 0.0–40.0)

## 2020-01-23 LAB — HEPATIC FUNCTION PANEL
ALT: 7 U/L (ref 0–53)
AST: 11 U/L (ref 0–37)
Albumin: 3.9 g/dL (ref 3.5–5.2)
Alkaline Phosphatase: 78 U/L (ref 39–117)
Bilirubin, Direct: 0.1 mg/dL (ref 0.0–0.3)
Total Bilirubin: 0.5 mg/dL (ref 0.2–1.2)
Total Protein: 7.1 g/dL (ref 6.0–8.3)

## 2020-01-23 LAB — IBC PANEL
Iron: 91 ug/dL (ref 42–165)
Saturation Ratios: 37.1 % (ref 20.0–50.0)
Transferrin: 175 mg/dL — ABNORMAL LOW (ref 212.0–360.0)

## 2020-01-23 LAB — BASIC METABOLIC PANEL
BUN: 9 mg/dL (ref 6–23)
CO2: 32 mEq/L (ref 19–32)
Calcium: 9.2 mg/dL (ref 8.4–10.5)
Chloride: 96 mEq/L (ref 96–112)
Creatinine, Ser: 0.84 mg/dL (ref 0.40–1.50)
GFR: 90.98 mL/min (ref >60.00)
Glucose, Bld: 97 mg/dL (ref 70–99)
Potassium: 4.5 mEq/L (ref 3.5–5.1)
Sodium: 133 mEq/L — ABNORMAL LOW (ref 135–145)

## 2020-01-23 LAB — CBC WITH DIFFERENTIAL/PLATELET
Basophils Absolute: 0 10*3/uL (ref 0.0–0.1)
Basophils Relative: 0.4 % (ref 0.0–3.0)
Eosinophils Absolute: 0.1 10*3/uL (ref 0.0–0.7)
Eosinophils Relative: 0.7 % (ref 0.0–5.0)
HCT: 42.8 % (ref 39.0–52.0)
Hemoglobin: 14.9 g/dL (ref 13.0–17.0)
Lymphocytes Relative: 24.5 % (ref 12.0–46.0)
Lymphs Abs: 1.8 10*3/uL (ref 0.7–4.0)
MCHC: 34.7 g/dL (ref 30.0–36.0)
MCV: 89.9 fl (ref 78.0–100.0)
Monocytes Absolute: 0.5 10*3/uL (ref 0.1–1.0)
Monocytes Relative: 7.1 % (ref 3.0–12.0)
Neutro Abs: 5 10*3/uL (ref 1.4–7.7)
Neutrophils Relative %: 67.3 % (ref 43.0–77.0)
Platelets: 178 10*3/uL (ref 150.0–400.0)
RBC: 4.76 Mil/uL (ref 4.22–5.81)
RDW: 13.8 % (ref 11.5–15.5)
WBC: 7.5 10*3/uL (ref 4.0–10.5)

## 2020-01-23 LAB — URINALYSIS, ROUTINE W REFLEX MICROSCOPIC
Bilirubin Urine: NEGATIVE
Hgb urine dipstick: NEGATIVE
Ketones, ur: NEGATIVE
Leukocytes,Ua: NEGATIVE
Nitrite: NEGATIVE
RBC / HPF: NONE SEEN (ref 0–?)
Specific Gravity, Urine: 1.02 (ref 1.000–1.030)
Total Protein, Urine: NEGATIVE
Urine Glucose: NEGATIVE
Urobilinogen, UA: 0.2 (ref 0.0–1.0)
pH: 6.5 (ref 5.0–8.0)

## 2020-01-23 LAB — PSA: PSA: 12.06 ng/mL — ABNORMAL HIGH (ref 0.10–4.00)

## 2020-01-23 LAB — VITAMIN D 25 HYDROXY (VIT D DEFICIENCY, FRACTURES): VITD: 33.71 ng/mL (ref 30.00–100.00)

## 2020-01-23 LAB — TSH: TSH: 1.29 u[IU]/mL (ref 0.35–4.50)

## 2020-01-23 LAB — VITAMIN B12: Vitamin B-12: 112 pg/mL — ABNORMAL LOW (ref 211–911)

## 2020-01-23 LAB — HEMOGLOBIN A1C: Hgb A1c MFr Bld: 5.5 % (ref 4.6–6.5)

## 2020-01-23 MED ORDER — PREDNISONE 10 MG PO TABS: ORAL_TABLET | ORAL | 0 refills | Status: DC

## 2020-01-23 MED ORDER — TRAMADOL HCL 50 MG PO TABS: 50.0000 mg | ORAL_TABLET | Freq: Four times a day (QID) | ORAL | 0 refills | Status: DC | PRN

## 2020-01-23 NOTE — Progress Notes (Signed)
Subjective:    Patient ID: [name], male    DOB: 1951-01-16, 69 y.o.   MRN: [id number]  HPI   Here to f/u; overall doing ok,  Pt denies chest pain, increasing sob or doe, wheezing, orthopnea, PND, increased LE swelling, palpitations, dizziness or syncope.  Pt denies new neurological symptoms such as new headache, or facial or extremity weakness or numbness.  Pt denies polydipsia, polyuria, or low sugar episode.  Pt states overall good compliance with meds, mostly trying to follow appropriate diet, with wt overall stable,  but little exercise however, mostly due to recurrence of pain in the past 2 wks to the right buttock radiating to the distal leg with some numbness but no weakness or lumbar pain associated.  Did have Recent prostatitis, but now resolved, Denies urinary symptoms such as dysuria, frequency, urgency, flank pain, hematuria or n/v, fever, chills. Past Medical History:  Diagnosis Date  . Allergic rhinitis   . Anemia, pernicious   . Anxiety and depression    sees Dr. [name]  . Arthritis   . B12 deficiency 01/23/2020  . Catheter-associated urinary tract infection ([place])   . Diverticulosis of colon   . Elevated PSA    and hypogonadism, sees urologist  . GERD (gastroesophageal reflux disease)    hiatal hernia  . Hx of blood transfusion reaction 1999  . Hyperlipidemia   . Hyperparathyroidism ([place])   . Hyperplastic colon polyp   . Hypertension    acei causes cough  . IBS (irritable bowel syndrome)   . Nephrolithiasis   . Obstructive chronic bronchitis without exacerbation, followed by Dr. [name] 03/04/2009   ONO RA  Normal 07/28/11 6 min walk >544m no desat PFTs 08/08/11: DLCO 70% TLC 80%  FeV1 80%   Fef 25 75 56% with significant improvement after BD   . Peritonitis ([place])    after surgery in 1999  . Pneumonia   . PONV (postoperative nausea and vomiting)   . Rectal fissure   . Trigeminal neuralgia    has facial asymetry  . Ventral hernia    Past Surgical History:   Procedure Laterality Date  . ABDOMINAL SURGERY     Multiple  . BRAIN SURGERY  01/2011   Trigeminal nerve/Dr Vertell Limber  . CHOLECYSTECTOMY  1981  . DENTAL SURGERY  2019   tooth extraction, root canal  . HAND SURGERY  1973   Left  . HERNIA REPAIR  1999  . KNEE SURGERY  1997   Left  . LITHOTRIPSY     Right  . NISSEN FUNDOPLICATION  [id number]    complicated by gastric perforation, peritonitis, ventral nernia   . RHIZOTOMY Right 11/17/2014   Procedure: RHIZOTOMY TO RIGHT V2,V3;  Surgeon: Erline Levine, MD;  Location: [facility];  Service: Neurosurgery;  Laterality: Right;  right  . RHIZOTOMY Right 12/22/2014   Procedure: Right V2 and V3 trigeminal rhysolysis;  Surgeon: Erline Levine, MD;  Location: [facility];  Service: Neurosurgery;  Laterality: Right;  Right V2 and V3 trigeminal rhysolysis  . RHIZOTOMY W/ RADIOFREQUENCY ABLATION  08/2017   Dr [name]  . UPPER GASTROINTESTINAL ENDOSCOPY    . VENTRAL HERNIA REPAIR  2004    reports that he quit smoking about 23 years ago. His smoking use included cigarettes. He has a 20.00 pack-year smoking history. He quit smokeless tobacco use about 36 years ago.  His smokeless tobacco use included chew. He reports that he does not drink alcohol or use drugs. family history includes  Coronary artery disease in his mother; Diabetes in his brother, mother, and sister; Heart disease in his brother and brother; Hypertension in his brother. Allergies  Allergen Reactions  . Dilantin [Phenytoin]     bradycardia  . Dilaudid [Hydromorphone Hcl]   . Ace Inhibitors Cough  . Sulfa Antibiotics     hives  . Augmentin [Amoxicillin-Pot Clavulanate] Nausea And Vomiting  . Codeine Nausea And Vomiting   Current Outpatient Medications on File Prior to Visit  Medication Sig Dispense Refill  . acetaminophen (TYLENOL) 325 MG tablet Take 2 tablets (650 mg total) by mouth every 6 (six) hours as needed for mild pain, moderate pain or fever.    . ALPRAZolam (XANAX) 1 MG tablet  Take 1 mg by mouth at bedtime as needed for anxiety.     . baclofen (LIORESAL) 10 MG tablet Take 10 mg by mouth daily as needed for muscle spasms.    . carbamazepine (TEGRETOL XR) 100 MG 12 hr tablet Take 100 mg by mouth 3 (three) times daily.    . cholecalciferol (VITAMIN D3) 25 MCG (1000 UNIT) tablet Take 1,000 Units by mouth daily.    Marland Kitchen dexlansoprazole (DEXILANT) 60 MG capsule Take 1 capsule (60 mg total) by mouth daily. 30 capsule 0  . esomeprazole (NEXIUM) 40 MG capsule Take 40 mg by mouth daily at 12 noon.    . gabapentin (NEURONTIN) 400 MG capsule Take 400 mg by mouth 4 (four) times daily.     Marland Kitchen losartan-hydrochlorothiazide (HYZAAR) 50-12.5 MG tablet Take 1 tablet by mouth daily. 90 tablet 3  . pantoprazole (PROTONIX) 40 MG tablet Take 40 mg by mouth daily.    . polyethylene glycol powder (GLYCOLAX/MIRALAX) powder DISSOLVE 1 CAPFUL IN LIQUID EVERY DAY AS NEEDED FOR CONSTIPATION 527 g 2  . Probiotic Product (ALIGN) 4 MG CAPS Take by mouth daily.    . sucralfate (CARAFATE) 1 GM/10ML suspension Take 10 mLs (1 g total) by mouth 4 (four) times daily -  with meals and at bedtime. 420 mL 0  . Zinc 220 (50 Zn) MG CAPS Take 1 capsule by mouth daily.     No current facility-administered medications on file prior to visit.   Review of Systems All otherwise neg per pt     Objective:   Physical Exam BP 118/76 (BP Location: Left Arm, Patient Position: Sitting, Cuff Size: Large)   Pulse 64   Temp (!) 97.1 F (36.2 C) (Oral)   Ht 5\' 11"  (1.803 m)   Wt 212 lb (96.2 kg)   SpO2 98%   BMI 29.57 kg/m  VS noted,  Constitutional: Pt appears in NAD HENT: Head: NCAT.  Right Ear: External ear normal.  Left Ear: External ear normal.  Eyes: . Pupils are equal, round, and reactive to light. Conjunctivae and EOM are normal Nose: without d/c or deformity Neck: Neck supple. Gross normal ROM Cardiovascular: Normal rate and regular rhythm.   Pulmonary/Chest: Effort normal and breath sounds without rales  or wheezing.  Abd:  Soft, NT, ND, + BS, no organomegaly Neurological: Pt is alert. At baseline orientation, motor grossly intact Skin: Skin is warm. No rashes, other new lesions, no LE edema Psychiatric: Pt behavior is normal without agitation  Spine - nontender Lab Results  Component Value Date   WBC 7.5 01/23/2020   HGB 14.9 01/23/2020   HCT 42.8 01/23/2020   PLT 178.0 01/23/2020   GLUCOSE 97 01/23/2020   CHOL 154 01/23/2020   TRIG 105.0 01/23/2020   HDL 45.60  01/23/2020   LDLCALC 87 01/23/2020   ALT 7 01/23/2020   AST 11 01/23/2020   NA 133 (L) 01/23/2020   K 4.5 01/23/2020   CL 96 01/23/2020   CREATININE 0.84 01/23/2020   BUN 9 01/23/2020   CO2 32 01/23/2020   TSH 1.29 01/23/2020   PSA 12.06 (H) 01/23/2020   HGBA1C 5.5 01/23/2020          Assessment & Plan:

## 2020-01-23 NOTE — Patient Instructions (Addendum)
Please take all new medication as prescribed - the tramadol and prednisone  Please continue all other medications as before, and refills have been done if requested.  Please have the pharmacy call with any other refills you may need.  Please continue your efforts at being more active, low cholesterol diet, and weight control.  You are otherwise up to date with prevention measures today.  Please keep your appointments with your specialists as you may have planned  Please go to the LAB at the blood drawing area for the tests to be done  You will be contacted by phone if any changes need to be made immediately.  Otherwise, you will receive a letter about your results with an explanation, but please check with MyChart first.  Please remember to sign up for MyChart if you have not done so, as this will be important to you in the future with finding out test results, communicating by private email, and scheduling acute appointments online when needed.  Please make an Appointment to return in 6 months, or sooner if needed

## 2020-01-25 ENCOUNTER — Other Ambulatory Visit: Payer: Self-pay | Admitting: Internal Medicine

## 2020-01-25 ENCOUNTER — Encounter: Payer: Self-pay | Admitting: Internal Medicine

## 2020-01-25 DIAGNOSIS — M79604 Pain in right leg: Secondary | ICD-10-CM | POA: Insufficient documentation

## 2020-01-25 MED ORDER — VITAMIN B-12 1000 MCG PO TABS: 1000.0000 ug | ORAL_TABLET | Freq: Every day | ORAL | 3 refills | Status: DC

## 2020-01-25 NOTE — Assessment & Plan Note (Signed)
stable overall by history and exam, recent data reviewed with pt, and pt to continue medical treatment as before,  to f/u any worsening symptoms or concerns  

## 2020-01-25 NOTE — Assessment & Plan Note (Addendum)
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

## 2020-01-25 NOTE — Assessment & Plan Note (Signed)
To recheck lab s/p replacement

## 2020-01-27 ENCOUNTER — Encounter: Payer: Self-pay | Admitting: Gastroenterology

## 2020-01-27 NOTE — Progress Notes (Signed)
Cardiology Office Note   Date:  01/30/2020   ID:  [name], DOB 1951/07/30, MRN [id number]  PCP:  [name], MD  Cardiologist:   Jenkins Rouge, MD   No chief complaint on file.     History of Present Illness: [name] is a 69 y.o. male who presents for f/U regarding chest pain. Referred by Dr [name] 12/2018   CRF;s include HTN and HLD. He has chronic GERD post Nissen fundoplication and recurrent hiatal hernia Taking Dexilant and Carafate He gets frequent burning pain in epigastric area and chest as well as burping and nausea. Definitely GI Overtones to pain   Retired from [employer] has 3 kids. Brother had stents at his age No chest pain   Calcium score done 01/02/19 was 71 below average for age 62 th percentile with LDL running in the 87 range with diet Rx  DC from hospital 12/27/19 with SBO from scar tissue He tested positive for COVID x 2 that admission as well   Having some muscular sounding pain in lateral aspect of right leg It is not exertional and not associated with any swelling   Past Medical History:  Diagnosis Date  . Allergic rhinitis   . Anemia, pernicious   . Anxiety and depression    sees Dr. [name]  . Arthritis   . B12 deficiency 01/23/2020  . Catheter-associated urinary tract infection ([place])   . Diverticulosis of colon   . Elevated PSA    and hypogonadism, sees urologist  . GERD (gastroesophageal reflux disease)    hiatal hernia  . Hx of blood transfusion reaction 1999  . Hyperlipidemia   . Hyperparathyroidism ([place])   . Hyperplastic colon polyp   . Hypertension    acei causes cough  . IBS (irritable bowel syndrome)   . Nephrolithiasis   . Obstructive chronic bronchitis without exacerbation, followed by Dr. [name] 03/04/2009   ONO RA  Normal 07/28/11 6 min walk >517m no desat PFTs 08/08/11: DLCO 70% TLC 80%  FeV1 80%   Fef 25 75 56% with significant improvement after BD   . Peritonitis ([place])    after surgery in 1999  .  Pneumonia   . PONV (postoperative nausea and vomiting)   . Rectal fissure   . Trigeminal neuralgia    has facial asymetry  . Ventral hernia     Past Surgical History:  Procedure Laterality Date  . ABDOMINAL SURGERY     Multiple  . BRAIN SURGERY  01/2011   Trigeminal nerve/Dr Vertell Limber  . CHOLECYSTECTOMY  1981  . DENTAL SURGERY  2019   tooth extraction, root canal  . HAND SURGERY  1973   Left  . HERNIA REPAIR  1999  . KNEE SURGERY  1997   Left  . LITHOTRIPSY     Right  . NISSEN FUNDOPLICATION  [id number]    complicated by gastric perforation, peritonitis, ventral nernia   . RHIZOTOMY Right 11/17/2014   Procedure: RHIZOTOMY TO RIGHT V2,V3;  Surgeon: Erline Levine, MD;  Location: [facility];  Service: Neurosurgery;  Laterality: Right;  right  . RHIZOTOMY Right 12/22/2014   Procedure: Right V2 and V3 trigeminal rhysolysis;  Surgeon: Erline Levine, MD;  Location: [facility];  Service: Neurosurgery;  Laterality: Right;  Right V2 and V3 trigeminal rhysolysis  . RHIZOTOMY W/ RADIOFREQUENCY ABLATION  08/2017   Dr [name]  . UPPER GASTROINTESTINAL ENDOSCOPY    . VENTRAL HERNIA REPAIR  2004  Current Outpatient Medications  Medication Sig Dispense Refill  . acetaminophen (TYLENOL) 325 MG tablet Take 2 tablets (650 mg total) by mouth every 6 (six) hours as needed for mild pain, moderate pain or fever.    . ALPRAZolam (XANAX) 1 MG tablet Take 1 mg by mouth at bedtime as needed for anxiety.     . baclofen (LIORESAL) 10 MG tablet Take 10 mg by mouth daily as needed for muscle spasms.    . carbamazepine (TEGRETOL XR) 100 MG 12 hr tablet Take 100 mg by mouth 3 (three) times daily.    . cholecalciferol (VITAMIN D3) 25 MCG (1000 UNIT) tablet Take 1,000 Units by mouth daily.    Marland Kitchen dexlansoprazole (DEXILANT) 60 MG capsule Take 1 capsule (60 mg total) by mouth daily. 30 capsule 0  . esomeprazole (NEXIUM) 40 MG capsule Take 40 mg by mouth daily at 12 noon.    . gabapentin (NEURONTIN) 400 MG capsule  Take 400 mg by mouth 4 (four) times daily.     Marland Kitchen losartan-hydrochlorothiazide (HYZAAR) 50-12.5 MG tablet Take 1 tablet by mouth daily. 90 tablet 3  . pantoprazole (PROTONIX) 40 MG tablet Take 40 mg by mouth daily.    . polyethylene glycol powder (GLYCOLAX/MIRALAX) powder DISSOLVE 1 CAPFUL IN LIQUID EVERY DAY AS NEEDED FOR CONSTIPATION 527 g 2  . predniSONE (DELTASONE) 10 MG tablet 3 tabs by mouth per day for 3 days,2tabs per day for 3 days,1tab per day for 3 days 18 tablet 0  . Probiotic Product (ALIGN) 4 MG CAPS Take by mouth daily.    . sucralfate (CARAFATE) 1 GM/10ML suspension Take 10 mLs (1 g total) by mouth 4 (four) times daily -  with meals and at bedtime. 420 mL 0  . traMADol (ULTRAM) 50 MG tablet Take 1 tablet (50 mg total) by mouth every 6 (six) hours as needed. 30 tablet 0  . vitamin B-12 (CYANOCOBALAMIN) 1000 MCG tablet Take 1 tablet (1,000 mcg total) by mouth daily. 90 tablet 3  . Zinc 220 (50 Zn) MG CAPS Take 1 capsule by mouth daily.     No current facility-administered medications for this visit.    Allergies:   Dilantin [phenytoin], Dilaudid [hydromorphone hcl], Ace inhibitors, Sulfa antibiotics, Augmentin [amoxicillin-pot clavulanate], and Codeine    Social History:  The patient  reports that he quit smoking about 23 years ago. His smoking use included cigarettes. He has a 20.00 pack-year smoking history. He quit smokeless tobacco use about 36 years ago.  His smokeless tobacco use included chew. He reports that he does not drink alcohol or use drugs.   Family History:  The patient's family history includes Coronary artery disease in his mother; Diabetes in his brother, mother, and sister; Heart disease in his brother and brother; Hypertension in his brother.    ROS:  Please see the history of present illness.   Otherwise, review of systems are positive for none.   All other systems are reviewed and negative.    PHYSICAL EXAM: VS:  BP 104/68   Pulse 66   Ht 5\' 11"  (1.803  m)   Wt 210 lb (95.3 kg)   SpO2 99%   BMI 29.29 kg/m  , BMI Body mass index is 29.29 kg/m. Affect appropriate Healthy:  appears stated age 74: normal Neck supple with no adenopathy JVP normal no bruits no thyromegaly Lungs clear with no wheezing and good diaphragmatic motion Heart:  S1/S2 no murmur, no rub, gallop or click PMI normal Abdomen: benighn, BS  positve, no tenderness, no AAA no bruit.  No HSM or HJR Distal pulses intact with no bruits No edema Neuro non-focal Skin warm and dry No muscular weakness    EKG:  09/13/17  SR rate 85 ICRBBB  12/17/18 SR rate 70 normal ECG    Recent Labs: 12/25/2019: Magnesium 1.7 01/23/2020: ALT 7; BUN 9; Creatinine, Ser 0.84; Hemoglobin 14.9; Platelets 178.0; Potassium 4.5; Sodium 133; TSH 1.29    Lipid Panel    Component Value Date/Time   CHOL 154 01/23/2020 1153   TRIG 105.0 01/23/2020 1153   HDL 45.60 01/23/2020 1153   CHOLHDL 3 01/23/2020 1153   VLDL 21.0 01/23/2020 1153   LDLCALC 87 01/23/2020 1153      Wt Readings from Last 3 Encounters:  01/30/20 210 lb (95.3 kg)  01/23/20 212 lb (96.2 kg)  01/21/20 214 lb 9.6 oz (97.3 kg)      Other studies Reviewed: Additional studies/ records that were reviewed today include: Notes from primary Notes from GI , ECG.    ASSESSMENT AND PLAN:  1.  Chest Pain:  Atypical no need for ETT normal ECG Family history CAD with calcium score 71 / 63 th percentile continue risk factor modification  2.  GERD:  Chronic complex problem f/u [facility] continue Dexilant and Carafate 3.  HTN:  Well controlled.  Continue current medications and low sodium Dash type diet.   4.  HLD:  LDL 87 continue diet Rx  As calcium score is below average for age  6. COVID: mild resolved discussed vaccine in 90 days 6. SBO;  F/u with primary resolved no abdominal pain 7. Leg Pain: good pulses on exam no edema to suggest DVT f/u primary    Current medicines are reviewed at length with the patient today.   The patient does not have concerns regarding medicines.  The following changes have been made:  no change  Labs/ tests ordered today include: None   No orders of the defined types were placed in this encounter.    Disposition:   FU with cardiology  In a year     Signed, Jenkins Rouge, MD  01/30/2020 10:41 AM    [address], [place], [place]  [address] Phone: [phone]; Fax: [phone]

## 2020-01-30 ENCOUNTER — Ambulatory Visit (INDEPENDENT_AMBULATORY_CARE_PROVIDER_SITE_OTHER): Payer: Medicare Other | Admitting: Cardiovascular Disease

## 2020-01-30 ENCOUNTER — Encounter: Payer: Self-pay | Admitting: Cardiovascular Disease

## 2020-01-30 ENCOUNTER — Other Ambulatory Visit: Payer: Self-pay

## 2020-01-30 VITALS — BP 104/68 | HR 66 | Ht 71.0 in | Wt 210.0 lb

## 2020-01-30 DIAGNOSIS — I10 Essential (primary) hypertension: Secondary | ICD-10-CM

## 2020-01-30 DIAGNOSIS — R079 Chest pain, unspecified: Secondary | ICD-10-CM

## 2020-01-30 NOTE — Patient Instructions (Addendum)
Medication Instructions:  Your physician recommends that you continue on your current medications as directed. Please refer to the Current Medication list given to you today.  *If you need a refill on your cardiac medications before your next appointment, please call your pharmacy*   Lab Work: None ordered  If you have labs (blood work) drawn today and your tests are completely normal, you will receive your results only by: Marland Kitchen MyChart Message (if you have MyChart) OR . A paper copy in the mail If you have any lab test that is abnormal or we need to change your treatment, we will call you to review the results.   Testing/Procedures: None ordered   Follow-Up: At [facility], you and your health needs are our priority.  As part of our continuing mission to provide you with exceptional heart care, we have created designated Provider Care Teams.  These Care Teams include your primary Cardiologist (physician) and Advanced Practice Providers (APPs -  Physician Assistants and Nurse Practitioners) who all work together to provide you with the care you need, when you need it.  We recommend signing up for the patient portal called "MyChart".  Sign up information is provided on this After Visit Summary.  MyChart is used to connect with patients for Virtual Visits (Telemedicine).  Patients are able to view lab/test results, encounter notes, upcoming appointments, etc.  Non-urgent messages can be sent to your provider as well.   To learn more about what you can do with MyChart, go to [email or web address].    Your next appointment:   12 month(s)  The format for your next appointment:   In Person  Provider:   You may see Dr. Johnsie Cancel or one of the following Advanced Practice Providers on your designated Care Team:    Truitt Merle, NP  Cecilie Kicks, NP  Kathyrn Drown, NP    Other Instructions

## 2020-02-02 DIAGNOSIS — N401 Enlarged prostate with lower urinary tract symptoms: Secondary | ICD-10-CM | POA: Diagnosis not present

## 2020-02-02 DIAGNOSIS — N411 Chronic prostatitis: Secondary | ICD-10-CM | POA: Diagnosis not present

## 2020-02-02 DIAGNOSIS — R3912 Poor urinary stream: Secondary | ICD-10-CM | POA: Diagnosis not present

## 2020-02-02 DIAGNOSIS — R972 Elevated prostate specific antigen [PSA]: Secondary | ICD-10-CM | POA: Diagnosis not present

## 2020-02-03 ENCOUNTER — Encounter: Payer: Self-pay | Admitting: Family Medicine

## 2020-02-03 ENCOUNTER — Other Ambulatory Visit: Payer: Self-pay

## 2020-02-03 ENCOUNTER — Ambulatory Visit (INDEPENDENT_AMBULATORY_CARE_PROVIDER_SITE_OTHER): Payer: Medicare Other | Admitting: Family Medicine

## 2020-02-03 ENCOUNTER — Ambulatory Visit (INDEPENDENT_AMBULATORY_CARE_PROVIDER_SITE_OTHER): Payer: Medicare Other

## 2020-02-03 VITALS — BP 148/90 | HR 57 | Ht 71.0 in | Wt 212.0 lb

## 2020-02-03 DIAGNOSIS — M5416 Radiculopathy, lumbar region: Secondary | ICD-10-CM

## 2020-02-03 DIAGNOSIS — U071 COVID-19: Secondary | ICD-10-CM

## 2020-02-03 DIAGNOSIS — M2550 Pain in unspecified joint: Secondary | ICD-10-CM

## 2020-02-03 DIAGNOSIS — M79604 Pain in right leg: Secondary | ICD-10-CM | POA: Diagnosis not present

## 2020-02-03 DIAGNOSIS — M545 Low back pain, unspecified: Secondary | ICD-10-CM

## 2020-02-03 DIAGNOSIS — G8929 Other chronic pain: Secondary | ICD-10-CM | POA: Diagnosis not present

## 2020-02-03 DIAGNOSIS — M79661 Pain in right lower leg: Secondary | ICD-10-CM

## 2020-02-03 LAB — SEDIMENTATION RATE: Sed Rate: 20 mm/hr (ref 0–20)

## 2020-02-03 NOTE — Assessment & Plan Note (Signed)
Patient does have signs and symptoms more consistent with lumbar radiculopathy of the right lower extremity.  Patient as well as wife is very concerned for the potential for deep venous thrombosis of the right lower extremity.  I think this is a low likelihood the patient did have Covid and was immobilized for quite some time when improving the hospitalization for the small bowel obstruction and diverticulitis.  Patient will have x-rays done but secondary to the weakness of the lower extremity and the severe positive straight leg test and already being on significant gabapentin, muscle relaxers without any significant improvement I would like to get also advanced imaging with an MRI and he would be a candidate for nerve root injections or possible epidural.  Patient though at this point is in too much pain to even function daily.  Discussed the possibility of other medications and chronic medications at the moment.  Please see patient instructions.  Follow-up with me again after advanced imaging to discuss current treatment.  Reviewed patient's chart as well as previous MRI that did show an L5-S1 nerve impingement right greater than left.  Total time with patient as well as reviewing chart 45 minutes.

## 2020-02-03 NOTE — Progress Notes (Signed)
[place] Phone: [phone] Subjective:    I'm seeing this patient by the request  of:  [name], MD  CC: Right hip and leg pain  [id number]  [name] is a 69 y.o. male coming in with complaint of right hip and calf pain. Patient states he has been experiencing the pain for 5 weeks. A lot of walking causes increased calf pain. Standing is also painful. Right glut is painful from compensation. Has tried heat and prednisone that he believes has not helped. Has also used icy hot. Patient and his wife believe it may be a blood clot. At the end of the day his ankle is also painful. Pain causes him to limp.  Patient was in the hospital for quite some time secondary to diverticulitis and did have a positive Covid.  States that this pain seems to be worsening.  Affecting even walking.  Sometimes feels like he is tripping over his toe.       Past Medical History:  Diagnosis Date  . Allergic rhinitis   . Anemia, pernicious   . Anxiety and depression    sees Dr. [name]  . Arthritis   . B12 deficiency 01/23/2020  . Catheter-associated urinary tract infection ([place])   . Diverticulosis of colon   . Elevated PSA    and hypogonadism, sees urologist  . GERD (gastroesophageal reflux disease)    hiatal hernia  . Hx of blood transfusion reaction 1999  . Hyperlipidemia   . Hyperparathyroidism ([place])   . Hyperplastic colon polyp   . Hypertension    acei causes cough  . IBS (irritable bowel syndrome)   . Nephrolithiasis   . Obstructive chronic bronchitis without exacerbation, followed by Dr. [name] 03/04/2009   ONO RA  Normal 07/28/11 6 min walk >53m no desat PFTs 08/08/11: DLCO 70% TLC 80%  FeV1 80%   Fef 25 75 56% with significant improvement after BD   . Peritonitis ([place])    after surgery in 1999  . Pneumonia   . PONV (postoperative nausea and vomiting)   . Rectal fissure   . Trigeminal neuralgia    has  facial asymetry  . Ventral hernia    Past Surgical History:  Procedure Laterality Date  . ABDOMINAL SURGERY     Multiple  . BRAIN SURGERY  01/2011   Trigeminal nerve/Dr Vertell Limber  . CHOLECYSTECTOMY  1981  . DENTAL SURGERY  2019   tooth extraction, root canal  . HAND SURGERY  1973   Left  . HERNIA REPAIR  1999  . KNEE SURGERY  1997   Left  . LITHOTRIPSY     Right  . NISSEN FUNDOPLICATION  [id number]    complicated by gastric perforation, peritonitis, ventral nernia   . RHIZOTOMY Right 11/17/2014   Procedure: RHIZOTOMY TO RIGHT V2,V3;  Surgeon: Erline Levine, MD;  Location: [facility];  Service: Neurosurgery;  Laterality: Right;  right  . RHIZOTOMY Right 12/22/2014   Procedure: Right V2 and V3 trigeminal rhysolysis;  Surgeon: Erline Levine, MD;  Location: [facility];  Service: Neurosurgery;  Laterality: Right;  Right V2 and V3 trigeminal rhysolysis  . RHIZOTOMY W/ RADIOFREQUENCY ABLATION  08/2017   Dr [name]  . UPPER GASTROINTESTINAL ENDOSCOPY    . VENTRAL HERNIA REPAIR  2004   Social History   Socioeconomic History  . Marital status: Married    Spouse name: Not on file  . Number of children:  2  . Years of education: Not on file  . Highest education level: Not on file  Occupational History  . Occupation: Disabled - psych  Tobacco Use  . Smoking status: Former Smoker    Packs/day: 1.00    Years: 20.00    Pack years: 20.00    Types: Cigarettes    Quit date: 11/06/1996    Years since quitting: 23.2  . Smokeless tobacco: Former Systems developer    Types: [place] date: 11/07/1983  Substance and Sexual Activity  . Alcohol use: No  . Drug use: No  . Sexual activity: Yes    Partners: Female  Other Topics Concern  . Not on file  Social History Narrative   Daily Caffeine Use:  Yes         Social Determinants of Health   Financial Resource Strain:   . Difficulty of Paying Living Expenses:   Food Insecurity:   . Worried About Charity fundraiser in the Last Year:   . Academic librarian in the Last Year:   Transportation Needs:   . Film/video editor (Medical):   Marland Kitchen Lack of Transportation (Non-Medical):   Physical Activity:   . Days of Exercise per Week:   . Minutes of Exercise per Session:   Stress:   . Feeling of Stress :   Social Connections:   . Frequency of Communication with Friends and Family:   . Frequency of Social Gatherings with Friends and Family:   . Attends Religious Services:   . Active Member of Clubs or Organizations:   . Attends Archivist Meetings:   Marland Kitchen Marital Status:    Allergies  Allergen Reactions  . Dilantin [Phenytoin]     bradycardia  . Dilaudid [Hydromorphone Hcl]   . Ace Inhibitors Cough  . Sulfa Antibiotics     hives  . Augmentin [Amoxicillin-Pot Clavulanate] Nausea And Vomiting  . Codeine Nausea And Vomiting   Family History  Problem Relation Age of Onset  . Diabetes Mother   . Coronary artery disease Mother        CABG  . Diabetes Sister   . Diabetes Brother   . Hypertension Brother   . Heart disease Brother   . Heart disease Brother   . Colon cancer Neg Hx   . Esophageal cancer Neg Hx   . Stomach cancer Neg Hx     Current Outpatient Medications (Endocrine & Metabolic):  .  predniSONE (DELTASONE) 10 MG tablet, 3 tabs by mouth per day for 3 days,2tabs per day for 3 days,1tab per day for 3 days  Current Outpatient Medications (Cardiovascular):  .  losartan-hydrochlorothiazide (HYZAAR) 50-12.5 MG tablet, Take 1 tablet by mouth daily.   Current Outpatient Medications (Analgesics):  .  acetaminophen (TYLENOL) 325 MG tablet, Take 2 tablets (650 mg total) by mouth every 6 (six) hours as needed for mild pain, moderate pain or fever. .  traMADol (ULTRAM) 50 MG tablet, Take 1 tablet (50 mg total) by mouth every 6 (six) hours as needed.  Current Outpatient Medications (Hematological):  .  vitamin B-12 (CYANOCOBALAMIN) 1000 MCG tablet, Take 1 tablet (1,000 mcg total) by mouth daily.  Current Outpatient  Medications (Other):  Marland Kitchen  ALPRAZolam (XANAX) 1 MG tablet, Take 1 mg by mouth at bedtime as needed for anxiety.  .  baclofen (LIORESAL) 10 MG tablet, Take 10 mg by mouth daily as needed for muscle spasms. .  carbamazepine (TEGRETOL XR) 100 MG 12 hr  tablet, Take 100 mg by mouth 3 (three) times daily. .  cholecalciferol (VITAMIN D3) 25 MCG (1000 UNIT) tablet, Take 1,000 Units by mouth daily. Marland Kitchen  dexlansoprazole (DEXILANT) 60 MG capsule, Take 1 capsule (60 mg total) by mouth daily. Marland Kitchen  esomeprazole (NEXIUM) 40 MG capsule, Take 40 mg by mouth daily at 12 noon. .  gabapentin (NEURONTIN) 400 MG capsule, Take 400 mg by mouth 4 (four) times daily.  .  pantoprazole (PROTONIX) 40 MG tablet, Take 40 mg by mouth daily. .  polyethylene glycol powder (GLYCOLAX/MIRALAX) powder, DISSOLVE 1 CAPFUL IN LIQUID EVERY DAY AS NEEDED FOR CONSTIPATION .  Probiotic Product (ALIGN) 4 MG CAPS, Take by mouth daily. .  sucralfate (CARAFATE) 1 GM/10ML suspension, Take 10 mLs (1 g total) by mouth 4 (four) times daily -  with meals and at bedtime. .  Zinc 220 (50 Zn) MG CAPS, Take 1 capsule by mouth daily.   Reviewed prior external information including notes and imaging from  primary care provider As well as notes that were available from care everywhere and other healthcare systems.  Past medical history, social, surgical and family history all reviewed in electronic medical record.  No pertanent information unless stated regarding to the chief complaint.   Review of Systems:  No headache, visual changes, nausea, vomiting, diarrhea, constipation, dizziness, abdominal pain, skin rash, fevers, chills, night sweats, weight loss, swollen lymph nodes, body aches, joint swelling, chest pain, shortness of breath, mood changes. POSITIVE muscle aches  Objective  Blood pressure (!) 148/90, pulse (!) 57, height 5\' 11"  (1.803 m), weight 212 lb (96.2 kg), SpO2 98 %.   General: No apparent distress alert and oriented x3 mood and affect  normal, dressed appropriately.  Very mild slurred speech HEENT: Pupils equal, extraocular movements intact  Respiratory: does not appear short of breath  Cardiovascular: No lower extremity edema, non tender, no erythema  Gait antalgic favoring the right leg MSK: Low back exam shows severe tenderness to palpation of the paraspinal musculature of the lumbar spine.  Positive straight leg test noted with some weakness in dorsiflexion compared to the contralateral side.  Deep tendon reflexes decreased to 1+ of the Achilles on the right side.  Mild tightness noted of FABER test as well on the right    Impression and Recommendations:     This case required medical decision making of moderate complexity. The above documentation has been reviewed and is accurate and complete Lyndal Pulley, DO       Note: This dictation was prepared with Dragon dictation along with smaller phrase technology. Any transcriptional errors that result from this process are unintentional.

## 2020-02-03 NOTE — Patient Instructions (Addendum)
Good to see you Labs today Doppler call [phone] Xray of back today with MRI  Lets see what MRI shows

## 2020-02-04 ENCOUNTER — Other Ambulatory Visit: Payer: Self-pay

## 2020-02-04 ENCOUNTER — Ambulatory Visit (HOSPITAL_COMMUNITY)
Admission: RE | Admit: 2020-02-04 | Discharge: 2020-02-04 | Disposition: A | Discharge status: Home / Self Care | Payer: Medicare Other | Source: Ambulatory Visit | Attending: Gastroenterology | Admitting: Gastroenterology

## 2020-02-04 DIAGNOSIS — Z8719 Personal history of other diseases of the digestive system: Secondary | ICD-10-CM

## 2020-02-04 DIAGNOSIS — K219 Gastro-esophageal reflux disease without esophagitis: Secondary | ICD-10-CM | POA: Diagnosis not present

## 2020-02-04 LAB — SAR COV2 SEROLOGY (COVID19)AB(IGG),IA: SARS CoV2 AB IGG: POSITIVE — AB

## 2020-02-04 LAB — D-DIMER, QUANTITATIVE: D-Dimer, Quant: 0.22 mcg/mL FEU (ref <0.50)

## 2020-02-05 ENCOUNTER — Ambulatory Visit (HOSPITAL_COMMUNITY)
Admission: RE | Admit: 2020-02-05 | Discharge: 2020-02-05 | Disposition: A | Discharge status: Home / Self Care | Payer: Medicare Other | Source: Ambulatory Visit | Attending: Cardiology | Admitting: Cardiology

## 2020-02-05 DIAGNOSIS — M79661 Pain in right lower leg: Secondary | ICD-10-CM | POA: Diagnosis not present

## 2020-02-18 ENCOUNTER — Telehealth: Payer: Self-pay

## 2020-02-18 NOTE — Telephone Encounter (Signed)
    Spouse calling to request order for lab draw to test B12, for tomorrow

## 2020-02-18 NOTE — Telephone Encounter (Signed)
New message   The patient is asking for blood work to be drawn due to still tired, B12 was low last month.   The patient is aware the MD is on vacation

## 2020-02-18 NOTE — Telephone Encounter (Signed)
No need for b12 lab, as it normally takes several months to get back to normal, and let pt know that fatigue is not normally caused by low B12 (though since at least the 1950's many doctors have made money and made patients go away by giving b12 shots for this)

## 2020-02-18 NOTE — Telephone Encounter (Signed)
Will forward to MD desktop and await for his response when he returns.Marland KitchenJohny Morgan

## 2020-02-19 ENCOUNTER — Telehealth: Payer: Self-pay | Admitting: Internal Medicine

## 2020-02-19 ENCOUNTER — Telehealth: Payer: Self-pay | Admitting: Family Medicine

## 2020-02-19 NOTE — Telephone Encounter (Signed)
Called patient informed of message 

## 2020-02-19 NOTE — Telephone Encounter (Signed)
Pt wife called, MRI is scheduled at [place] [facility] for 4/29. Per pt this is first available, but pt wife is concerned as Tarrence is in pain. She would like to know if there is any where or way to get him in earlier.  Juluis Rainier, I mentioned [facility] and she is not comfortable driving there)  [id number]

## 2020-02-19 NOTE — Telephone Encounter (Signed)
Error no note needed °

## 2020-02-19 NOTE — Telephone Encounter (Signed)
Spoke with patient's wife about calling [facility]. She will call back if they decide to change locations.

## 2020-02-19 NOTE — Telephone Encounter (Signed)
Pt wife keeping original appt at [place].

## 2020-02-25 ENCOUNTER — Ambulatory Visit
Admission: RE | Admit: 2020-02-25 | Discharge: 2020-02-25 | Disposition: A | Discharge status: Home / Self Care | Payer: Medicare Other | Source: Ambulatory Visit | Attending: Family Medicine | Admitting: Family Medicine

## 2020-02-25 ENCOUNTER — Other Ambulatory Visit: Payer: Self-pay

## 2020-02-25 DIAGNOSIS — G8929 Other chronic pain: Secondary | ICD-10-CM

## 2020-02-25 DIAGNOSIS — M545 Low back pain, unspecified: Secondary | ICD-10-CM

## 2020-02-25 DIAGNOSIS — M48061 Spinal stenosis, lumbar region without neurogenic claudication: Secondary | ICD-10-CM | POA: Diagnosis not present

## 2020-02-25 NOTE — Progress Notes (Signed)
[place] Phone: [phone] Subjective:   [name], am serving as a scribe for Dr. Hulan Saas. This visit occurred during the SARS-CoV-2 public health emergency.  Safety protocols were in place, including screening questions prior to the visit, additional usage of staff PPE, and extensive cleaning of exam room while observing appropriate contact time as indicated for disinfecting solutions.   I'm seeing this patient by the request  of:  [name], MD  CC: Low back pain follow-up  [id number]   02/03/2020 Patient does have signs and symptoms more consistent with lumbar radiculopathy of the right lower extremity.  Patient as well as wife is very concerned for the potential for deep venous thrombosis of the right lower extremity.  I think this is a low likelihood the patient did have Covid and was immobilized for quite some time when improving the hospitalization for the small bowel obstruction and diverticulitis.  Patient will have x-rays done but secondary to the weakness of the lower extremity and the severe positive straight leg test and already being on significant gabapentin, muscle relaxers without any significant improvement I would like to get also advanced imaging with an MRI and he would be a candidate for nerve root injections or possible epidural.  Patient though at this point is in too much pain to even function daily.  Discussed the possibility of other medications and chronic medications at the moment.  Please see patient instructions.  Follow-up with me again after advanced imaging to discuss current treatment.  Reviewed patient's chart as well as previous MRI that did show an L5-S1 nerve impingement right greater than left.  Total time with patient as well as reviewing chart 45 minutes.  Update 02/26/2020 [name] is a 69 y.o. male coming in with complaint of lumbar radiculopathy. Here  for MRI results today. States that his back pain is worsening with pain now into his right glute.   IMPRESSION: 1. Moderately severe bilateral facet arthritis at L5-S1 with a synovial cyst arising from the right facet joint compressing the right L5 nerve in the right neural foramen. 2. Synovial cyst arising from the superior aspect of the left neural foramen at L5-S1 compressing the left L5 nerve. 3. Mild spinal stenosis at L4-5 due to hypertrophy of the ligamentum flavum and facet joints.      Past Medical History:  Diagnosis Date  . Allergic rhinitis   . Anemia, pernicious   . Anxiety and depression    sees Dr. [name]  . Arthritis   . B12 deficiency 01/23/2020  . Catheter-associated urinary tract infection ([place])   . Diverticulosis of colon   . Elevated PSA    and hypogonadism, sees urologist  . GERD (gastroesophageal reflux disease)    hiatal hernia  . Hx of blood transfusion reaction 1999  . Hyperlipidemia   . Hyperparathyroidism ([place])   . Hyperplastic colon polyp   . Hypertension    acei causes cough  . IBS (irritable bowel syndrome)   . Nephrolithiasis   . Obstructive chronic bronchitis without exacerbation, followed by Dr. [name] 03/04/2009   ONO RA  Normal 07/28/11 6 min walk >566m Morgan desat PFTs 08/08/11: DLCO 70% TLC 80%  FeV1 80%   Fef 25 75 56% with significant improvement after BD   . Peritonitis ([place])    after surgery in 1999  . Pneumonia   . PONV (postoperative nausea and vomiting)   . Rectal  fissure   . Trigeminal neuralgia    has facial asymetry  . Ventral hernia    Past Surgical History:  Procedure Laterality Date  . ABDOMINAL SURGERY     Multiple  . BRAIN SURGERY  01/2011   Trigeminal nerve/Dr Vertell Limber  . CHOLECYSTECTOMY  1981  . DENTAL SURGERY  2019   tooth extraction, root canal  . HAND SURGERY  1973   Left  . HERNIA REPAIR  1999  . KNEE SURGERY  1997   Left  . LITHOTRIPSY     Right  . NISSEN FUNDOPLICATION  [id number]    complicated by gastric  perforation, peritonitis, ventral nernia   . RHIZOTOMY Right 11/17/2014   Procedure: RHIZOTOMY TO RIGHT V2,V3;  Surgeon: Erline Levine, MD;  Location: [facility];  Service: Neurosurgery;  Laterality: Right;  right  . RHIZOTOMY Right 12/22/2014   Procedure: Right V2 and V3 trigeminal rhysolysis;  Surgeon: Erline Levine, MD;  Location: [facility];  Service: Neurosurgery;  Laterality: Right;  Right V2 and V3 trigeminal rhysolysis  . RHIZOTOMY W/ RADIOFREQUENCY ABLATION  08/2017   Dr [name]  . UPPER GASTROINTESTINAL ENDOSCOPY    . VENTRAL HERNIA REPAIR  2004   Social History   Socioeconomic History  . Marital status: Married    Spouse name: Not on file  . Number of children: 2  . Years of education: Not on file  . Highest education level: Not on file  Occupational History  . Occupation: Disabled - psych  Tobacco Use  . Smoking status: Former Smoker    Packs/day: 1.00    Years: 20.00    Pack years: 20.00    Types: Cigarettes    Quit date: 11/06/1996    Years since quitting: 23.3  . Smokeless tobacco: Former Systems developer    Types: [place] date: 11/07/1983  Substance and Sexual Activity  . Alcohol use: Morgan  . Drug use: Morgan  . Sexual activity: Yes    Partners: Female  Other Topics Concern  . Not on file  Social History Narrative   Daily Caffeine Use:  Yes         Social Determinants of Health   Financial Resource Strain:   . Difficulty of Paying Living Expenses:   Food Insecurity:   . Worried About Charity fundraiser in the Last Year:   . Arboriculturist in the Last Year:   Transportation Needs:   . Film/video editor (Medical):   Marland Kitchen Lack of Transportation (Non-Medical):   Physical Activity:   . Days of Exercise per Week:   . Minutes of Exercise per Session:   Stress:   . Feeling of Stress :   Social Connections:   . Frequency of Communication with Friends and Family:   . Frequency of Social Gatherings with Friends and Family:   . Attends Religious Services:   .  Active Member of Clubs or Organizations:   . Attends Archivist Meetings:   Marland Kitchen Marital Status:    Allergies  Allergen Reactions  . Dilantin [Phenytoin]     bradycardia  . Dilaudid [Hydromorphone Hcl]   . Ace Inhibitors Cough  . Sulfa Antibiotics     hives  . Augmentin [Amoxicillin-Pot Clavulanate] Nausea And Vomiting  . Codeine Nausea And Vomiting   Family History  Problem Relation Age of Onset  . Diabetes Mother   . Coronary artery disease Mother        CABG  . Diabetes  Sister   . Diabetes Brother   . Hypertension Brother   . Heart disease Brother   . Heart disease Brother   . Colon cancer Neg Hx   . Esophageal cancer Neg Hx   . Stomach cancer Neg Hx     Current Outpatient Medications (Endocrine & Metabolic):  .  predniSONE (DELTASONE) 10 MG tablet, 3 tabs by mouth per day for 3 days,2tabs per day for 3 days,1tab per day for 3 days  Current Outpatient Medications (Cardiovascular):  .  losartan-hydrochlorothiazide (HYZAAR) 50-12.5 MG tablet, Take 1 tablet by mouth daily.   Current Outpatient Medications (Analgesics):  .  acetaminophen (TYLENOL) 325 MG tablet, Take 2 tablets (650 mg total) by mouth every 6 (six) hours as needed for mild pain, moderate pain or fever. .  traMADol (ULTRAM) 50 MG tablet, Take 1 tablet (50 mg total) by mouth every 6 (six) hours as needed.  Current Outpatient Medications (Hematological):  .  vitamin B-12 (CYANOCOBALAMIN) 1000 MCG tablet, Take 1 tablet (1,000 mcg total) by mouth daily.  Current Outpatient Medications (Other):  Marland Kitchen  ALPRAZolam (XANAX) 1 MG tablet, Take 1 mg by mouth at bedtime as needed for anxiety.  .  baclofen (LIORESAL) 10 MG tablet, Take 10 mg by mouth daily as needed for muscle spasms. .  carbamazepine (TEGRETOL XR) 100 MG 12 hr tablet, Take 100 mg by mouth 3 (three) times daily. .  cholecalciferol (VITAMIN D3) 25 MCG (1000 UNIT) tablet, Take 1,000 Units by mouth daily. Marland Kitchen  dexlansoprazole (DEXILANT) 60 MG capsule,  Take 1 capsule (60 mg total) by mouth daily. Marland Kitchen  esomeprazole (NEXIUM) 40 MG capsule, Take 40 mg by mouth daily at 12 noon. .  gabapentin (NEURONTIN) 400 MG capsule, Take 400 mg by mouth 4 (four) times daily.  .  pantoprazole (PROTONIX) 40 MG tablet, Take 40 mg by mouth daily. .  polyethylene glycol powder (GLYCOLAX/MIRALAX) powder, DISSOLVE 1 CAPFUL IN LIQUID EVERY DAY AS NEEDED FOR CONSTIPATION .  Probiotic Product (ALIGN) 4 MG CAPS, Take by mouth daily. .  sucralfate (CARAFATE) 1 GM/10ML suspension, Take 10 mLs (1 g total) by mouth 4 (four) times daily -  with meals and at bedtime. .  Zinc 220 (50 Zn) MG CAPS, Take 1 capsule by mouth daily.   Reviewed prior external information including notes and imaging from  primary care provider As well as notes that were available from care everywhere and other healthcare systems.  Past medical history, social, surgical and family history all reviewed in electronic medical record.  Morgan pertanent information unless stated regarding to the chief complaint.   Review of Systems:  Morgan headache, visual changes, nausea, vomiting, diarrhea, constipation, dizziness, abdominal pain, skin rash, fevers, chills, night sweats, weight loss, swollen lymph nodes, body aches, joint swelling, chest pain, shortness of breath, mood changes. POSITIVE muscle aches  Objective  Blood pressure 116/64, pulse 64, height 5\' 11"  (1.803 m), weight 212 lb (96.2 kg), SpO2 97 %.   General: Morgan apparent distress alert and oriented x3 mood and affect normal, dressed appropriately.  HEENT: Pupils equal, extraocular movements intact  Respiratory: Patient's speak in full sentences and does not appear short of breath  Cardiovascular: Morgan lower extremity edema, non tender, Morgan erythema  Neuro: Cranial nerves II through XII are intact, neurovascularly intact in all extremities with 2+ DTRs and 2+ pulses.  Gait normal with good balance and coordination.  MSK:  Non tender with full range of  motion and good stability and symmetric strength  and tone of shoulders, elbows, wrist, hip, knee and ankles bilaterally.  Low back exam shows the patient does have loss of lordosis.  Tender to palpation in the paraspinal musculature lumbar spine right greater than the left.  Positive radicular symptoms with straight leg test on the right side in the L5 distribution but Morgan significant weakness.  Deep tendon reflexes do appear to be intact.   Impression and Recommendations:     This case required medical decision making of moderate complexity. The above documentation has been reviewed and is accurate and complete Lyndal Pulley, DO       Note: This dictation was prepared with Dragon dictation along with smaller phrase technology. Any transcriptional errors that result from this process are unintentional.

## 2020-02-26 ENCOUNTER — Ambulatory Visit (INDEPENDENT_AMBULATORY_CARE_PROVIDER_SITE_OTHER): Payer: Medicare Other | Admitting: Family Medicine

## 2020-02-26 ENCOUNTER — Encounter: Payer: Self-pay | Admitting: Family Medicine

## 2020-02-26 VITALS — BP 116/64 | HR 64 | Ht 71.0 in | Wt 212.0 lb

## 2020-02-26 DIAGNOSIS — M5416 Radiculopathy, lumbar region: Secondary | ICD-10-CM

## 2020-02-26 NOTE — Patient Instructions (Addendum)
[email or web address] [phone] See me 3 weeks after epidural

## 2020-02-26 NOTE — Assessment & Plan Note (Signed)
Chronic problem with exacerbation.  Patient's MRI consistent with the L5 nerve root impingement noted.  We discussed with patient icing regimen, home exercises, different treatment options including the referral to surgical intervention.  Patient has decided that she would like to do the epidural first and then the idea of a potential for a nerve root injection if this does not help.  Patient will follow up 3 to 4 weeks after the injection.  Continue all other medications at this time.

## 2020-03-04 ENCOUNTER — Ambulatory Visit
Admission: RE | Admit: 2020-03-04 | Discharge: 2020-03-04 | Disposition: A | Discharge status: Home / Self Care | Payer: Medicare Other | Source: Ambulatory Visit | Attending: Family Medicine | Admitting: Family Medicine

## 2020-03-04 ENCOUNTER — Other Ambulatory Visit: Payer: Self-pay

## 2020-03-04 ENCOUNTER — Other Ambulatory Visit: Payer: Medicare Other

## 2020-03-04 DIAGNOSIS — M48061 Spinal stenosis, lumbar region without neurogenic claudication: Secondary | ICD-10-CM | POA: Diagnosis not present

## 2020-03-04 DIAGNOSIS — M5416 Radiculopathy, lumbar region: Secondary | ICD-10-CM

## 2020-03-04 MED ORDER — IOPAMIDOL (ISOVUE-M 200) INJECTION 41%
1.0000 mL | Freq: Once | INTRAMUSCULAR | Status: AC
  Administered 2020-03-04: 1 mL via EPIDURAL

## 2020-03-04 MED ORDER — METHYLPREDNISOLONE ACETATE 40 MG/ML INJ SUSP (RADIOLOG
120.0000 mg | Freq: Once | INTRAMUSCULAR | Status: AC
  Administered 2020-03-04: 120 mg via EPIDURAL

## 2020-03-04 NOTE — Discharge Instructions (Signed)

## 2020-03-25 DIAGNOSIS — Z23 Encounter for immunization: Secondary | ICD-10-CM | POA: Diagnosis not present

## 2020-04-01 ENCOUNTER — Ambulatory Visit: Payer: Medicare Other | Admitting: Gastroenterology

## 2020-04-15 ENCOUNTER — Other Ambulatory Visit (INDEPENDENT_AMBULATORY_CARE_PROVIDER_SITE_OTHER): Payer: Medicare Other

## 2020-04-15 ENCOUNTER — Telehealth: Payer: Self-pay

## 2020-04-15 ENCOUNTER — Ambulatory Visit: Payer: Medicare Other | Admitting: Gastroenterology

## 2020-04-15 ENCOUNTER — Ambulatory Visit (INDEPENDENT_AMBULATORY_CARE_PROVIDER_SITE_OTHER): Payer: Medicare Other | Admitting: Internal Medicine

## 2020-04-15 ENCOUNTER — Encounter: Payer: Self-pay | Admitting: Internal Medicine

## 2020-04-15 ENCOUNTER — Other Ambulatory Visit: Payer: Self-pay

## 2020-04-15 VITALS — BP 158/100 | HR 71 | Temp 98.4°F | Resp 16 | Ht 71.0 in | Wt 211.5 lb

## 2020-04-15 DIAGNOSIS — I10 Essential (primary) hypertension: Secondary | ICD-10-CM | POA: Diagnosis not present

## 2020-04-15 DIAGNOSIS — J0100 Acute maxillary sinusitis, unspecified: Secondary | ICD-10-CM | POA: Diagnosis not present

## 2020-04-15 DIAGNOSIS — T502X5A Adverse effect of carbonic-anhydrase inhibitors, benzothiadiazides and other diuretics, initial encounter: Secondary | ICD-10-CM

## 2020-04-15 DIAGNOSIS — E876 Hypokalemia: Secondary | ICD-10-CM | POA: Diagnosis not present

## 2020-04-15 DIAGNOSIS — E538 Deficiency of other specified B group vitamins: Secondary | ICD-10-CM

## 2020-04-15 DIAGNOSIS — J3089 Other allergic rhinitis: Secondary | ICD-10-CM

## 2020-04-15 LAB — CBC WITH DIFFERENTIAL/PLATELET
Basophils Absolute: 0.1 10*3/uL (ref 0.0–0.1)
Basophils Relative: 0.7 % (ref 0.0–3.0)
Eosinophils Absolute: 0 10*3/uL (ref 0.0–0.7)
Eosinophils Relative: 0.5 % (ref 0.0–5.0)
HCT: 44.1 % (ref 39.0–52.0)
Hemoglobin: 15.3 g/dL (ref 13.0–17.0)
Lymphocytes Relative: 32.9 % (ref 12.0–46.0)
Lymphs Abs: 2.6 10*3/uL (ref 0.7–4.0)
MCHC: 34.7 g/dL (ref 30.0–36.0)
MCV: 90.2 fl (ref 78.0–100.0)
Monocytes Absolute: 0.6 10*3/uL (ref 0.1–1.0)
Monocytes Relative: 7.2 % (ref 3.0–12.0)
Neutro Abs: 4.6 10*3/uL (ref 1.4–7.7)
Neutrophils Relative %: 58.7 % (ref 43.0–77.0)
Platelets: 155 10*3/uL (ref 150.0–400.0)
RBC: 4.89 Mil/uL (ref 4.22–5.81)
RDW: 13.6 % (ref 11.5–15.5)
WBC: 7.9 10*3/uL (ref 4.0–10.5)

## 2020-04-15 LAB — BASIC METABOLIC PANEL
BUN: 10 mg/dL (ref 6–23)
CO2: 33 mEq/L — ABNORMAL HIGH (ref 19–32)
Calcium: 9 mg/dL (ref 8.4–10.5)
Chloride: 95 mEq/L — ABNORMAL LOW (ref 96–112)
Creatinine, Ser: 0.8 mg/dL (ref 0.40–1.50)
GFR: 96.18 mL/min (ref >60.00)
Glucose, Bld: 112 mg/dL — ABNORMAL HIGH (ref 70–99)
Potassium: 3.4 mEq/L — ABNORMAL LOW (ref 3.5–5.1)
Sodium: 134 mEq/L — ABNORMAL LOW (ref 135–145)

## 2020-04-15 LAB — VITAMIN B12: Vitamin B-12: 200 pg/mL — ABNORMAL LOW (ref 211–911)

## 2020-04-15 LAB — FOLATE: Folate: 9.6 ng/mL (ref >5.9)

## 2020-04-15 MED ORDER — IRBESARTAN 300 MG PO TABS: 300.0000 mg | ORAL_TABLET | Freq: Every day | ORAL | 1 refills | Status: DC

## 2020-04-15 MED ORDER — CEFDINIR 300 MG PO CAPS: 300.0000 mg | ORAL_CAPSULE | Freq: Two times a day (BID) | ORAL | 0 refills | Status: AC

## 2020-04-15 MED ORDER — LEVOCETIRIZINE DIHYDROCHLORIDE 5 MG PO TABS: 5.0000 mg | ORAL_TABLET | Freq: Every evening | ORAL | 1 refills | Status: DC

## 2020-04-15 MED ORDER — METHYLPREDNISOLONE 4 MG PO TBPK: ORAL_TABLET | ORAL | 0 refills | Status: DC

## 2020-04-15 MED ORDER — INDAPAMIDE 1.25 MG PO TABS: 1.2500 mg | ORAL_TABLET | Freq: Every day | ORAL | 0 refills | Status: DC

## 2020-04-15 NOTE — Patient Instructions (Signed)

## 2020-04-15 NOTE — Progress Notes (Signed)
Subjective:  Patient ID: [name], male    DOB: October 19, 1951  Age: 69 y.o. MRN: [id number]  CC: Hypertension, Headache, Sinusitis, and Allergic Rhinitis   This visit occurred during the SARS-CoV-2 public health emergency.  Safety protocols were in place, including screening questions prior to the visit, additional usage of staff PPE, and extensive cleaning of exam room while observing appropriate contact time as indicated for disinfecting solutions.    HPI Arthur Speagle Pollack presents for f/up - He complains that his blood pressure is not adequately well controlled and he has had headaches for a few weeks.  He tells me he is compliant with the current combination of an ARB and thiazide diuretic.  He has a history of hyponatremia.  He denies any recent episodes of nausea, vomiting, dizziness, lightheadedness, chest pain, shortness of breath, edema, near syncope, or fatigue.  He has a history of B12 deficiency but is not taking his oral B12 supplement because it causes stomach upset.  He also complains of a 1 to 2-week history of right-sided facial pain, runny nose, postnasal drip, and sore throat.  He has not gotten much symptom relief with Claritin and Zyrtec.  He tells me he is not taking any decongestants or anti-inflammatories.  Outpatient Medications Prior to Visit  Medication Sig Dispense Refill  . acetaminophen (TYLENOL) 325 MG tablet Take 2 tablets (650 mg total) by mouth every 6 (six) hours as needed for mild pain, moderate pain or fever.    . ALPRAZolam (XANAX) 1 MG tablet Take 1 mg by mouth at bedtime as needed for anxiety.     . carbamazepine (TEGRETOL XR) 100 MG 12 hr tablet Take 100 mg by mouth 3 (three) times daily.    . cholecalciferol (VITAMIN D3) 25 MCG (1000 UNIT) tablet Take 1,000 Units by mouth daily.    Marland Kitchen dexlansoprazole (DEXILANT) 60 MG capsule Take 1 capsule (60 mg total) by mouth daily. 30 capsule 0  . esomeprazole (NEXIUM) 40 MG capsule Take 40 mg by mouth  daily at 12 noon.    . gabapentin (NEURONTIN) 400 MG capsule Take 400 mg by mouth 4 (four) times daily.     . polyethylene glycol powder (GLYCOLAX/MIRALAX) powder DISSOLVE 1 CAPFUL IN LIQUID EVERY DAY AS NEEDED FOR CONSTIPATION 527 g 2  . Probiotic Product (ALIGN) 4 MG CAPS Take by mouth daily.    . sucralfate (CARAFATE) 1 GM/10ML suspension Take 10 mLs (1 g total) by mouth 4 (four) times daily -  with meals and at bedtime. 420 mL 0  . Zinc 220 (50 Zn) MG CAPS Take 1 capsule by mouth daily.    . baclofen (LIORESAL) 10 MG tablet Take 10 mg by mouth daily as needed for muscle spasms.    Marland Kitchen losartan-hydrochlorothiazide (HYZAAR) 50-12.5 MG tablet Take 1 tablet by mouth daily. 90 tablet 3  . pantoprazole (PROTONIX) 40 MG tablet Take 40 mg by mouth daily.    . predniSONE (DELTASONE) 10 MG tablet 3 tabs by mouth per day for 3 days,2tabs per day for 3 days,1tab per day for 3 days 18 tablet 0  . traMADol (ULTRAM) 50 MG tablet Take 1 tablet (50 mg total) by mouth every 6 (six) hours as needed. 30 tablet 0  . vitamin B-12 (CYANOCOBALAMIN) 1000 MCG tablet Take 1 tablet (1,000 mcg total) by mouth daily. 90 tablet 3   No facility-administered medications prior to visit.    ROS Review of Systems  Constitutional: Negative.  Negative for chills, diaphoresis,  fatigue and fever.  HENT: Positive for congestion, postnasal drip, rhinorrhea, sinus pressure, sinus pain and sore throat. Negative for facial swelling, nosebleeds, sneezing, trouble swallowing and voice change.   Eyes: Negative.   Respiratory: Negative for cough, chest tightness, shortness of breath and wheezing.   Cardiovascular: Negative for chest pain, palpitations and leg swelling.  Gastrointestinal: Negative for abdominal pain, diarrhea, nausea and vomiting.  Endocrine: Negative.   Genitourinary: Negative.  Negative for difficulty urinating.  Musculoskeletal: Negative for arthralgias, myalgias and neck pain.  Skin: Negative.  Negative for color  change, pallor and rash.  Neurological: Positive for headaches. Negative for dizziness, tremors, weakness, light-headedness and numbness.  Hematological: Negative for adenopathy. Does not bruise/bleed easily.  Psychiatric/Behavioral: Negative.     Objective:  BP (!) 158/100 (BP Location: Left Arm, Patient Position: Sitting, Cuff Size: Normal)   Pulse 71   Temp 98.4 F (36.9 C) (Oral)   Resp 16   Ht 5\' 11"  (1.803 m)   Wt 211 lb 8 oz (95.9 kg)   SpO2 98%   BMI 29.50 kg/m   BP Readings from Last 3 Encounters:  04/15/20 (!) 158/100  03/04/20 128/84  02/26/20 116/64    Wt Readings from Last 3 Encounters:  04/15/20 211 lb 8 oz (95.9 kg)  02/26/20 212 lb (96.2 kg)  02/03/20 212 lb (96.2 kg)    Physical Exam Vitals reviewed.  Constitutional:      General: He is not in acute distress.    Appearance: He is well-developed. He is not ill-appearing, toxic-appearing or diaphoretic.  HENT:     Right Ear: Hearing, tympanic membrane, ear canal and external ear normal.     Left Ear: Hearing, tympanic membrane, ear canal and external ear normal.     Nose: Mucosal edema and congestion present. No nasal tenderness or rhinorrhea.     Right Turbinates: Swollen. Not enlarged or pale.     Left Turbinates: Swollen. Not enlarged or pale.     Right Sinus: Maxillary sinus tenderness present. No frontal sinus tenderness.     Left Sinus: No maxillary sinus tenderness or frontal sinus tenderness.     Mouth/Throat:     Mouth: Mucous membranes are moist.  Eyes:     General: No scleral icterus.    Conjunctiva/sclera: Conjunctivae normal.  Cardiovascular:     Rate and Rhythm: Normal rate and regular rhythm.     Heart sounds: No murmur heard.   Pulmonary:     Effort: Pulmonary effort is normal.     Breath sounds: No stridor. No wheezing, rhonchi or rales.  Abdominal:     General: Abdomen is flat. Bowel sounds are normal. There is no distension.     Palpations: Abdomen is soft. There is no  hepatomegaly, splenomegaly or mass.     Tenderness: There is no abdominal tenderness.     Hernia: No hernia is present.  Musculoskeletal:        General: Normal range of motion.     Cervical back: Normal range of motion and neck supple.     Right lower leg: No edema.     Left lower leg: No edema.  Skin:    General: Skin is warm and dry.     Coloration: Skin is not pale.  Neurological:     General: No focal deficit present.     Mental Status: He is alert and oriented to person, place, and time. Mental status is at baseline.     Motor: No weakness.  Coordination: Coordination normal.     Gait: Gait normal.     Deep Tendon Reflexes: Reflexes normal.  Psychiatric:        Mood and Affect: Mood normal.        Behavior: Behavior normal.        Thought Content: Thought content normal.        Judgment: Judgment normal.     Lab Results  Component Value Date   WBC 7.9 04/15/2020   HGB 15.3 04/15/2020   HCT 44.1 04/15/2020   PLT 155.0 04/15/2020   GLUCOSE 112 (H) 04/15/2020   CHOL 154 01/23/2020   TRIG 105.0 01/23/2020   HDL 45.60 01/23/2020   LDLCALC 87 01/23/2020   ALT 7 01/23/2020   AST 11 01/23/2020   NA 134 (L) 04/15/2020   K 3.4 (L) 04/15/2020   CL 95 (L) 04/15/2020   CREATININE 0.80 04/15/2020   BUN 10 04/15/2020   CO2 33 (H) 04/15/2020   TSH 1.29 01/23/2020   PSA 12.06 (H) 01/23/2020   HGBA1C 5.5 01/23/2020    DG INJECT DIAG/THERA/INC NEEDLE/CATH/PLC EPI/LUMB/SAC W/IMG  Result Date: 03/04/2020 CLINICAL DATA:  Lumbosacral spondylosis without myelopathy. Low back and right leg pain for the past 2 months. Recent onset left anterior thigh pain. Severe bilateral L5-S1 neuroforaminal stenosis due to synovial cysts. L5-S1 injection requested. FLUOROSCOPY TIME:  Radiation Exposure Index (as provided by the fluoroscopic device): 4.9 mGy Fluoroscopy Time:  33 seconds Number of Acquired Images:  0 PROCEDURE: The procedure, risks, benefits, and alternatives were explained to  the patient. Questions regarding the procedure were encouraged and answered. The patient understands and consents to the procedure. LUMBAR EPIDURAL INJECTION: An interlaminar approach was performed on the right at L5-S1. The overlying skin was cleansed and anesthetized. A 3.5 inch 20 gauge epidural needle was advanced using loss-of-resistance technique. DIAGNOSTIC EPIDURAL INJECTION: Injection of Isovue-M 200 shows a good epidural pattern with spread above and below the level of needle placement, primarily on the right. No vascular opacification is seen. THERAPEUTIC EPIDURAL INJECTION: 120 mg of Depo-Medrol mixed with 3 mL of 1% lidocaine were instilled. The procedure was well-tolerated, and the patient was discharged thirty minutes following the injection in good condition. COMPLICATIONS: None immediate. IMPRESSION: Technically successful interlaminar epidural injection on the right at L5-S1. Electronically Signed   By: Titus Dubin M.D.   On: 03/04/2020 13:59    Assessment & Plan:   Roben was seen today for hypertension, headache, sinusitis and allergic rhinitis .  Diagnoses and all orders for this visit:  Acute non-recurrent maxillary sinusitis -     cefdinir (OMNICEF) 300 MG capsule; Take 1 capsule (300 mg total) by mouth 2 (two) times daily for 10 days. -     CBC with Differential/Platelet; Future  Seasonal allergic rhinitis due to other allergic trigger- He is symptomatic with this.  I have asked him to upgrade to levocetirizine and to take a 6-day course of methylprednisolone. -     levocetirizine (XYZAL) 5 MG tablet; Take 1 tablet (5 mg total) by mouth every evening. -     methylPREDNISolone (MEDROL DOSEPAK) 4 MG TBPK tablet; TAKE AS DIRECTED  Essential hypertension- His blood pressure is not adequately well controlled but he is symptomatic.  He has mild chronic hyponatremia and hypokalemia.  I have asked him to upgrade to a more potent thiazide diuretic and ARB.  Will also start  potassium replacement. -     CBC with Differential/Platelet; Future -  Basic metabolic panel; Future -     indapamide (LOZOL) 1.25 MG tablet; Take 1 tablet (1.25 mg total) by mouth daily. -     irbesartan (AVAPRO) 300 MG tablet; Take 1 tablet (300 mg total) by mouth daily. -     potassium chloride SA (KLOR-CON) 20 MEQ tablet; Take 1 tablet (20 mEq total) by mouth 2 (two) times daily.  B12 deficiency- I have asked him to start parenteral B12 replacement therapy. -     Folate; Future -     Vitamin B12; Future  Diuretic-induced hypokalemia -     potassium chloride SA (KLOR-CON) 20 MEQ tablet; Take 1 tablet (20 mEq total) by mouth 2 (two) times daily.   I have discontinued Braxdon M. Crago's losartan-hydrochlorothiazide, baclofen, pantoprazole, traMADol, predniSONE, and vitamin B-12. I am also having him start on cefdinir, levocetirizine, methylPREDNISolone, indapamide, irbesartan, and potassium chloride SA. Additionally, I am having him maintain his ALPRAZolam, polyethylene glycol powder, gabapentin, Align, carbamazepine, cholecalciferol, acetaminophen, sucralfate, Zinc, Dexilant, and esomeprazole.  Meds ordered this encounter  Medications  . cefdinir (OMNICEF) 300 MG capsule    Sig: Take 1 capsule (300 mg total) by mouth 2 (two) times daily for 10 days.    Dispense:  20 capsule    Refill:  0  . levocetirizine (XYZAL) 5 MG tablet    Sig: Take 1 tablet (5 mg total) by mouth every evening.    Dispense:  90 tablet    Refill:  1  . methylPREDNISolone (MEDROL DOSEPAK) 4 MG TBPK tablet    Sig: TAKE AS DIRECTED    Dispense:  21 tablet    Refill:  0  . indapamide (LOZOL) 1.25 MG tablet    Sig: Take 1 tablet (1.25 mg total) by mouth daily.    Dispense:  90 tablet    Refill:  0  . irbesartan (AVAPRO) 300 MG tablet    Sig: Take 1 tablet (300 mg total) by mouth daily.    Dispense:  90 tablet    Refill:  1  . potassium chloride SA (KLOR-CON) 20 MEQ tablet    Sig: Take 1 tablet (20 mEq  total) by mouth 2 (two) times daily.    Dispense:  180 tablet    Refill:  0   I spent 60 minutes in preparing to see the patient by review of recent labs, imaging and procedures, obtaining and reviewing separately obtained history, communicating with the patient and family or caregiver, ordering medications, tests or procedures, and documenting clinical information in the EHR including the differential Dx, treatment, and any further evaluation and other management of 1. Acute non-recurrent maxillary sinusitis 2. Seasonal allergic rhinitis due to other allergic trigger 3. Essential hypertension 4. B12 deficiency 5. Diuretic-induced hypokalemia     Follow-up: Return in about 3 weeks (around 05/06/2020).  Scarlette Calico, MD

## 2020-04-15 NOTE — Telephone Encounter (Signed)
Patient's wife calling and spoke with [facility] on 04/15/2020 at 9:37am. States that the patient's BP has been high (173/109) and he has been having headaches. States that he has been on BP meds for years. Denies blurry vision.  Advised to see PCP within 24 hours.  Scheduled with Dr Ronnald Ramp on 04/15/2020 at 4:00pm.

## 2020-04-16 ENCOUNTER — Ambulatory Visit: Payer: Medicare Other | Admitting: Internal Medicine

## 2020-04-16 DIAGNOSIS — T502X5A Adverse effect of carbonic-anhydrase inhibitors, benzothiadiazides and other diuretics, initial encounter: Secondary | ICD-10-CM | POA: Insufficient documentation

## 2020-04-16 DIAGNOSIS — E876 Hypokalemia: Secondary | ICD-10-CM | POA: Insufficient documentation

## 2020-04-16 MED ORDER — POTASSIUM CHLORIDE CRYS ER 20 MEQ PO TBCR: 20.0000 meq | EXTENDED_RELEASE_TABLET | Freq: Two times a day (BID) | ORAL | 0 refills | Status: DC

## 2020-04-19 ENCOUNTER — Ambulatory Visit: Payer: Medicare Other | Admitting: Internal Medicine

## 2020-04-19 ENCOUNTER — Other Ambulatory Visit: Payer: Self-pay

## 2020-04-19 ENCOUNTER — Ambulatory Visit (INDEPENDENT_AMBULATORY_CARE_PROVIDER_SITE_OTHER): Payer: Medicare Other | Admitting: *Deleted

## 2020-04-19 DIAGNOSIS — E538 Deficiency of other specified B group vitamins: Secondary | ICD-10-CM

## 2020-04-19 MED ORDER — CYANOCOBALAMIN 1000 MCG/ML IJ SOLN
1000.0000 ug | Freq: Once | INTRAMUSCULAR | Status: AC
  Administered 2020-04-19: 1000 ug via INTRAMUSCULAR

## 2020-04-19 NOTE — Progress Notes (Signed)
I have reviewed and agree.

## 2020-04-19 NOTE — Progress Notes (Signed)
Pls cosign for B12 inj../lmb  

## 2020-04-22 ENCOUNTER — Other Ambulatory Visit: Payer: Self-pay | Admitting: Internal Medicine

## 2020-04-26 ENCOUNTER — Other Ambulatory Visit (HOSPITAL_COMMUNITY)
Admission: RE | Admit: 2020-04-26 | Discharge: 2020-04-26 | Disposition: A | Discharge status: Home / Self Care | Payer: Medicare Other | Source: Ambulatory Visit | Attending: Gastroenterology | Admitting: Gastroenterology

## 2020-04-26 ENCOUNTER — Ambulatory Visit (INDEPENDENT_AMBULATORY_CARE_PROVIDER_SITE_OTHER): Payer: Medicare Other | Admitting: Gastroenterology

## 2020-04-26 ENCOUNTER — Encounter: Payer: Self-pay | Admitting: Gastroenterology

## 2020-04-26 VITALS — BP 116/70 | HR 64 | Ht 69.75 in | Wt 210.0 lb

## 2020-04-26 DIAGNOSIS — Z01812 Encounter for preprocedural laboratory examination: Secondary | ICD-10-CM | POA: Diagnosis present

## 2020-04-26 DIAGNOSIS — Z8719 Personal history of other diseases of the digestive system: Secondary | ICD-10-CM

## 2020-04-26 DIAGNOSIS — K449 Diaphragmatic hernia without obstruction or gangrene: Secondary | ICD-10-CM | POA: Diagnosis not present

## 2020-04-26 DIAGNOSIS — Z20822 Contact with and (suspected) exposure to covid-19: Secondary | ICD-10-CM | POA: Insufficient documentation

## 2020-04-26 DIAGNOSIS — Z9889 Other specified postprocedural states: Secondary | ICD-10-CM

## 2020-04-26 DIAGNOSIS — R110 Nausea: Secondary | ICD-10-CM

## 2020-04-26 DIAGNOSIS — K219 Gastro-esophageal reflux disease without esophagitis: Secondary | ICD-10-CM

## 2020-04-26 DIAGNOSIS — R0789 Other chest pain: Secondary | ICD-10-CM

## 2020-04-26 DIAGNOSIS — R1110 Vomiting, unspecified: Secondary | ICD-10-CM

## 2020-04-26 LAB — SARS CORONAVIRUS 2 (TAT 6-24 HRS): SARS Coronavirus 2: NEGATIVE

## 2020-04-26 NOTE — Patient Instructions (Signed)
If you are age 68 or older, your body mass index should be between 23-30. Your Body mass index is 30.35 kg/m. If this is out of the aforementioned range listed, please consider follow up with your Primary Care Provider.  If you are age 88 or younger, your body mass index should be between 19-25. Your Body mass index is 30.35 kg/m. If this is out of the aformentioned range listed, please consider follow up with your Primary Care Provider.   We have given you samples of the following medication to take: Dexilant60 mg .  We are giving you a referral to Dr Kipp Brood for your hernia repair. If you do not hear from there office with in 1 week please contact our office at [phone].  You have been scheduled for an esophageal manometry at [facility] on 04/28/2020 at 8:30. Please arrive at 7:00am prior to your procedure for registration. You will need to go to outpatient registration (1st floor of the hospital) first. Make certain to bring your insurance cards as well as a complete list of medications.  Please remember the following:  1) Do not take any muscle relaxants, xanax (alprazolam) or ativan for 1 day prior to your test as well as the day of the test.  2) Nothing to eat or drink for 4 hours before your test.  3) Hold all diabetic medications/insulin the morning of the test. You may eat and take your medications after the test.  It will take at least 2 weeks to receive the results of this test from your physician. ------------------------------------------ ABOUT ESOPHAGEAL MANOMETRY Esophageal manometry (muh-NOM-uh-tree) is a test that gauges how well your esophagus works. Your esophagus is the long, muscular tube that connects your throat to your stomach. Esophageal manometry measures the rhythmic muscle contractions (peristalsis) that occur in your esophagus when you swallow. Esophageal manometry also measures the coordination and force exerted by the muscles of your esophagus.   During esophageal manometry, a thin, flexible tube (catheter) that contains sensors is passed through your nose, down your esophagus and into your stomach. Esophageal manometry can be helpful in diagnosing some mostly uncommon disorders that affect your esophagus.  Why it's done Esophageal manometry is used to evaluate the movement (motility) of food through the esophagus and into the stomach. The test measures how well the circular bands of muscle (sphincters) at the top and bottom of your esophagus open and close, as well as the pressure, strength and pattern of the wave of esophageal muscle contractions that moves food along.  What you can expect Esophageal manometry is an outpatient procedure done without sedation. Most people tolerate it well. You may be asked to change into a hospital gown before the test starts.  During esophageal manometry  . While you are sitting up, a member of your health care team sprays your throat with a numbing medication or puts numbing gel in your nose or both.  . A catheter is guided through your nose into your esophagus. The catheter may be sheathed in a water-filled sleeve. It doesn't interfere with your breathing. However, your eyes may water, and you may gag. You may have a slight nosebleed from irritation.  . After the catheter is in place, you may be asked to lie on your back on an exam table, or you may be asked to remain seated.  . You then swallow small sips of water. As you do, a computer connected to the catheter records the pressure, strength and pattern of your  esophageal muscle contractions.  . During the test, you'll be asked to breathe slowly and smoothly, remain as still as possible, and swallow only when you're asked to do so.  . A member of your health care team may move the catheter down into your stomach while the catheter continues its measurements.  . The catheter then is slowly withdrawn. The test usually lasts 20 to 30 minutes.  After  esophageal manometry  When your esophageal manometry is complete, you may return to your normal activities  This test typically takes 30-45 minutes to complete. ________________________________________________________________________________ Due to recent COVID-19 restrictions implemented by our local and state authorities and in an effort to keep both patients and staff as safe as possible, our hospital system now requires COVID-19 testing prior to any scheduled hospital procedure. Please go to our [facility] location drive thru testing site ([address] [place],  [address]) on 04/26/2020 at  10:20 am. There will be multiple testing areas, the first checkpoint being for pre-procedure/surgery testing. Get into the right (yellow) lane that leads to the PAT testing team. You will not be billed at the time of testing but may receive a bill later depending on your insurance. The approximate cost of the test is $100. You must agree to quarantine from the time of your testing until the procedure date on 04/28/2020 This should include staying at home with ONLY the people you live with. Avoid take-out, grocery store shopping or leaving the house for any non-emergent reason. Failure to have your COVID-19 test done on the date and time you have been scheduled will result in cancellation of procedure. Please call our office at [phone] if you have any questions.

## 2020-04-26 NOTE — Progress Notes (Signed)
[name]    [id number]    [birth year]  Primary Morgan Physician:Jeffrey, [name], MD  Referring Physician: [name], MD [address] [place],  [place] [address]   Chief complaint: GERD, atypical chest pain and epigastric discomfort HPI:  69 yr M with h/o chronic GERD s/p Nissen fundoplication, complicated redo with recurrent hernia and persistent GERD symptoms. He has daily reflux symptoms, somewhat better when he uses Dexilant but is not covered by insurance.  He tries to alternate Nexium with Dexilant and also has to use Carafate suspension or Mylanta as needed for breakthrough symptoms on daily basis.  He has intermittent epigastric pain and lower chest discomfort, worse on some days than others.  Nissen fundoplication in 1950 complicated by perforation at outside facility, had repair with redo with partial rewrap by Dr. Hassell Morgan in 1999.  History of ventral hernia repair with recurrent small bowel obstruction, most recent in February 2021.  He was hospitalized in February 2021 with small bowel obstruction.  Denies any constipation, obstipation, abdominal pain or vomiting.  His bowel habits are regular on daily MiraLAX.  No rectal bleeding or melena.  Upper GI series with small bowel follow-through February 04, 2020: Small hiatal hernia with spontaneous gastroesophageal reflux to upper esophagus.  Mildly dilated loops of jejunum in the left abdomen with no evidence of obstruction contrast reached cecum within 45 minutes.  Rest of the study unremarkable  CT abd & pelvis with contrast 12/20/19: Multiple dilated loops of small bowel, transition zone in the anterior abd beneath areas of prior ventral hernia repair consistent with adhesions, distal small bowel is decompressed. Scattered diverticula.      EGD December 07, 2017 with a 5 cm hiatal hernia and mild gastritis otherwise unremarkable exam   Colonoscopy January 2017 with removal of multiple adenomatous polyps  X3 and one tubulovillous adenoma with the largest polyp >2 cm Colonoscopy April 2018 with removal of 2 small diminutive tubular adenomas and 1 hyperplastic polyp      Outpatient Encounter Medications as of 04/26/2020  Medication Sig  . acetaminophen (TYLENOL) 325 MG tablet Take 2 tablets (650 mg total) by mouth every 6 (six) hours as needed for mild pain, moderate pain or fever.  . ALPRAZolam (XANAX) 1 MG tablet Take 1 mg by mouth at bedtime as needed for anxiety.   . carbamazepine (TEGRETOL XR) 100 MG 12 hr tablet Take 100 mg by mouth 3 (three) times daily.  . cholecalciferol (VITAMIN D3) 25 MCG (1000 UNIT) tablet Take 1,000 Units by mouth daily.  Marland Kitchen dexlansoprazole (DEXILANT) 60 MG capsule Take 1 capsule (60 mg total) by mouth daily.  Marland Kitchen esomeprazole (NEXIUM) 40 MG capsule Take 40 mg by mouth daily at 12 noon.  . gabapentin (NEURONTIN) 400 MG capsule Take 400 mg by mouth 4 (four) times daily.   . indapamide (LOZOL) 1.25 MG tablet Take 1 tablet (1.25 mg total) by mouth daily.  . irbesartan (AVAPRO) 300 MG tablet Take 1 tablet (300 mg total) by mouth daily.  Marland Kitchen levocetirizine (XYZAL) 5 MG tablet Take 1 tablet (5 mg total) by mouth every evening.  . methylPREDNISolone (MEDROL DOSEPAK) 4 MG TBPK tablet TAKE AS DIRECTED  . polyethylene glycol powder (GLYCOLAX/MIRALAX) powder DISSOLVE 1 CAPFUL IN LIQUID EVERY DAY AS NEEDED FOR CONSTIPATION  . potassium chloride SA (KLOR-CON) 20 MEQ tablet Take 1 tablet (20 mEq total) by mouth 2 (two) times daily.  . Probiotic Product (ALIGN)  4 MG CAPS Take by mouth daily.  . sucralfate (CARAFATE) 1 GM/10ML suspension Take 10 mLs (1 g total) by mouth 4 (four) times daily -  with meals and at bedtime.  . Zinc 220 (50 Zn) MG CAPS Take 1 capsule by mouth daily.   No facility-administered encounter medications on file as of 04/26/2020.    Allergies as of 04/26/2020 - Review Complete 04/15/2020  Allergen Reaction Noted  . Dilantin [phenytoin] Other (See Comments)  07/27/2017  . Dilaudid [hydromorphone hcl] Nausea And Vomiting 09/12/2017  . Sulfa antibiotics Hives 12/17/2014  . Ace inhibitors Cough 04/02/2008  . Augmentin [amoxicillin-pot clavulanate] Nausea And Vomiting 07/06/2017  . Codeine Nausea And Vomiting 11/10/2014    Past Medical History:  Diagnosis Date  . Allergic rhinitis   . Anemia, pernicious   . Anxiety and depression    sees [name]  . Arthritis   . B12 deficiency 01/23/2020  . Catheter-associated urinary tract infection ([place])   . Diverticulosis of colon   . Elevated PSA    and hypogonadism, sees urologist  . GERD (gastroesophageal reflux disease)    hiatal hernia  . Hx of blood transfusion reaction 1999  . Hyperlipidemia   . Hyperparathyroidism ([place])   . Hyperplastic colon polyp   . Hypertension    acei causes cough  . IBS (irritable bowel syndrome)   . Nephrolithiasis   . Obstructive chronic bronchitis without exacerbation, followed by [name] 03/04/2009   ONO RA  Normal 07/28/11 6 min walk >569m no desat PFTs 08/08/11: DLCO 70% TLC 80%  FeV1 80%   Fef 25 75 56% with significant improvement after BD   . Peritonitis ([place])    after surgery in 1999  . Pneumonia   . PONV (postoperative nausea and vomiting)   . Rectal fissure   . Trigeminal neuralgia    has facial asymetry  . Ventral hernia     Past Surgical History:  Procedure Laterality Date  . ABDOMINAL SURGERY     Multiple  . BRAIN SURGERY  01/2011   Trigeminal nerve/Dr Vertell Morgan  . CHOLECYSTECTOMY  1981  . DENTAL SURGERY  2019   tooth extraction, root canal  . HAND SURGERY  1973   Left  . HERNIA REPAIR  1999  . KNEE SURGERY  1997   Left  . LITHOTRIPSY     Right  . NISSEN FUNDOPLICATION  7510    complicated by gastric perforation, peritonitis, ventral nernia   . RHIZOTOMY Right 11/17/2014   Procedure: RHIZOTOMY TO RIGHT V2,V3;  Surgeon: Jeffrey Levine, MD;  Location: [facility];  Service: Neurosurgery;  Laterality: Right;  right  . RHIZOTOMY Right  12/22/2014   Procedure: Right V2 and V3 trigeminal rhysolysis;  Surgeon: Jeffrey Levine, MD;  Location: [facility];  Service: Neurosurgery;  Laterality: Right;  Right V2 and V3 trigeminal rhysolysis  . RHIZOTOMY W/ RADIOFREQUENCY ABLATION  08/2017   Dr [name]  . UPPER GASTROINTESTINAL ENDOSCOPY    . VENTRAL HERNIA REPAIR  2004    Family History  Problem Relation Age of Onset  . Diabetes Mother   . Coronary artery disease Mother        CABG  . Diabetes Sister   . Diabetes Brother   . Hypertension Brother   . Heart disease Brother   . Heart disease Brother   . Colon cancer Neg Hx   . Esophageal cancer Neg Hx   . Stomach cancer Neg Hx     Social History  Socioeconomic History  . Marital status: Married    Spouse name: Not on file  . Number of children: 2  . Years of education: Not on file  . Highest education level: Not on file  Occupational History  . Occupation: Disabled - psych  Tobacco Use  . Smoking status: Former Smoker    Packs/day: 1.00    Years: 20.00    Pack years: 20.00    Types: Cigarettes    Quit date: 11/06/1996    Years since quitting: 23.4  . Smokeless tobacco: Former Systems developer    Types: [place] date: 11/07/1983  Vaping Use  . Vaping Use: Never used  Substance and Sexual Activity  . Alcohol use: No  . Drug use: No  . Sexual activity: Yes    Partners: Female  Other Topics Concern  . Not on file  Social History Narrative   Daily Caffeine Use:  Yes         Social Determinants of Health   Financial Resource Strain:   . Difficulty of Paying Living Expenses:   Food Insecurity:   . Worried About Charity fundraiser in the Last Year:   . Arboriculturist in the Last Year:   Transportation Needs:   . Film/video editor (Medical):   Marland Kitchen Lack of Transportation (Non-Medical):   Physical Activity:   . Days of Exercise per Week:   . Minutes of Exercise per Session:   Stress:   . Feeling of Stress :   Social Connections:   . Frequency of  Communication with Friends and Family:   . Frequency of Social Gatherings with Friends and Family:   . Attends Religious Services:   . Active Member of Clubs or Organizations:   . Attends Archivist Meetings:   Marland Kitchen Marital Status:   Intimate Partner Violence:   . Fear of Current or Ex-Partner:   . Emotionally Abused:   Marland Kitchen Physically Abused:   . Sexually Abused:       Review of systems:  All other review of systems negative except as mentioned in the HPI.   Physical Exam: Vitals:   04/26/20 0812  BP: 116/70  Pulse: 64   Body mass index is 30.35 kg/m. Gen:      No acute distress Neuro: alert and oriented x 3 Psych: normal mood and affect  Data Reviewed:  Reviewed labs, radiology imaging, old records and pertinent past GI work up   Assessment and Plan/Recommendations: 69 year old male with history of GERD s/p Nissen fundoplication complicated by gastric perforation, repair with patch and redo partial wrap, ventral hernia repair with mesh, recurrent small bowel obstruction here for follow-up visit with persistent GERD symptoms, regurgitation, atypical chest/ epigastric discomfort and intermittent nausea despite high-dose PPI  Dexilant not covered by insurance, provided patient samples to use Continue Nexium 40 mg twice daily Antireflux measures Evidence of spontaneous reflux to proximal esophagus based on upper GI series  Will refer to Dr. Kipp Brood, thoracic surgeon to evaluate for possible repair of recurrent hiatal hernia and redo of the wrap  Obtain esophageal manometry to evaluate esophageal function prior to hiatal hernia repair surgery  History of recurrent SBO likely secondary to adhesions, improved with conservative management Continue MiraLAX daily with goal 1-2 soft bowel movements daily    The patient was provided an opportunity to ask questions and all were answered. The patient agreed with the plan and demonstrated an understanding of the  instructions.  Coy Saunas  Silverio Decamp , MD    CC: Jeffrey Borg, MD

## 2020-04-27 ENCOUNTER — Encounter: Payer: Self-pay | Admitting: Gastroenterology

## 2020-04-27 DIAGNOSIS — R972 Elevated prostate specific antigen [PSA]: Secondary | ICD-10-CM | POA: Diagnosis not present

## 2020-04-28 ENCOUNTER — Encounter (HOSPITAL_COMMUNITY): Payer: Self-pay | Admitting: Gastroenterology

## 2020-04-28 ENCOUNTER — Ambulatory Visit (HOSPITAL_COMMUNITY)
Admission: RE | Admit: 2020-04-28 | Discharge: 2020-04-28 | Disposition: A | Discharge status: Home / Self Care | Payer: Medicare Other | Attending: Gastroenterology | Admitting: Gastroenterology

## 2020-04-28 ENCOUNTER — Encounter (HOSPITAL_COMMUNITY)
Admission: RE | Disposition: A | Discharge status: Home / Self Care | Payer: Self-pay | Source: Home / Self Care | Attending: Gastroenterology

## 2020-04-28 DIAGNOSIS — K2100 Gastro-esophageal reflux disease with esophagitis, without bleeding: Secondary | ICD-10-CM

## 2020-04-28 DIAGNOSIS — K449 Diaphragmatic hernia without obstruction or gangrene: Secondary | ICD-10-CM | POA: Diagnosis not present

## 2020-04-28 DIAGNOSIS — K219 Gastro-esophageal reflux disease without esophagitis: Secondary | ICD-10-CM | POA: Insufficient documentation

## 2020-04-28 HISTORY — PX: ESOPHAGEAL MANOMETRY: SHX5429

## 2020-04-28 SURGERY — MANOMETRY, ESOPHAGUS
Anesthesia: Monitor Anesthesia Care

## 2020-04-28 MED ORDER — LIDOCAINE VISCOUS HCL 2 % MT SOLN
OROMUCOSAL | Status: AC
  Filled 2020-04-28: qty 15

## 2020-04-28 SURGICAL SUPPLY — 2 items
FACESHIELD LNG OPTICON STERILE (SAFETY) IMPLANT
GLOVE BIO SURGEON STRL SZ8 (GLOVE) ×4 IMPLANT

## 2020-04-28 NOTE — Progress Notes (Signed)
Esophageal manometry performed per protocol.  Patient tolerated well.  Report to be sent to Dr. Kavitha Nandigam 

## 2020-05-06 ENCOUNTER — Encounter: Payer: Self-pay | Admitting: Internal Medicine

## 2020-05-06 ENCOUNTER — Ambulatory Visit (INDEPENDENT_AMBULATORY_CARE_PROVIDER_SITE_OTHER): Payer: Medicare Other | Admitting: Internal Medicine

## 2020-05-06 ENCOUNTER — Other Ambulatory Visit: Payer: Self-pay

## 2020-05-06 VITALS — BP 130/80 | HR 62 | Temp 97.6°F | Ht 71.0 in | Wt 213.0 lb

## 2020-05-06 DIAGNOSIS — R739 Hyperglycemia, unspecified: Secondary | ICD-10-CM

## 2020-05-06 DIAGNOSIS — T502X5A Adverse effect of carbonic-anhydrase inhibitors, benzothiadiazides and other diuretics, initial encounter: Secondary | ICD-10-CM

## 2020-05-06 DIAGNOSIS — J3089 Other allergic rhinitis: Secondary | ICD-10-CM

## 2020-05-06 DIAGNOSIS — E538 Deficiency of other specified B group vitamins: Secondary | ICD-10-CM

## 2020-05-06 DIAGNOSIS — I10 Essential (primary) hypertension: Secondary | ICD-10-CM

## 2020-05-06 DIAGNOSIS — E876 Hypokalemia: Secondary | ICD-10-CM

## 2020-05-06 LAB — HEPATIC FUNCTION PANEL
ALT: 17 U/L (ref 0–53)
AST: 16 U/L (ref 0–37)
Albumin: 4.4 g/dL (ref 3.5–5.2)
Alkaline Phosphatase: 73 U/L (ref 39–117)
Bilirubin, Direct: 0.1 mg/dL (ref 0.0–0.3)
Total Bilirubin: 0.5 mg/dL (ref 0.2–1.2)
Total Protein: 7.3 g/dL (ref 6.0–8.3)

## 2020-05-06 LAB — BASIC METABOLIC PANEL
BUN: 9 mg/dL (ref 6–23)
CO2: 33 mEq/L — ABNORMAL HIGH (ref 19–32)
Calcium: 9.4 mg/dL (ref 8.4–10.5)
Chloride: 93 mEq/L — ABNORMAL LOW (ref 96–112)
Creatinine, Ser: 0.86 mg/dL (ref 0.40–1.50)
GFR: 88.47 mL/min (ref >60.00)
Glucose, Bld: 98 mg/dL (ref 70–99)
Potassium: 4.3 mEq/L (ref 3.5–5.1)
Sodium: 131 mEq/L — ABNORMAL LOW (ref 135–145)

## 2020-05-06 LAB — HEMOGLOBIN A1C: Hgb A1c MFr Bld: 5.5 % (ref 4.6–6.5)

## 2020-05-06 MED ORDER — CYANOCOBALAMIN 1000 MCG/ML IJ SOLN
1000.0000 ug | Freq: Once | INTRAMUSCULAR | Status: AC
  Administered 2020-05-06: 1000 ug via INTRAMUSCULAR

## 2020-05-06 MED ORDER — TRIAMCINOLONE ACETONIDE 55 MCG/ACT NA AERO: 2.0000 | INHALATION_SPRAY | Freq: Every day | NASAL | 12 refills | Status: DC

## 2020-05-06 MED ORDER — LOSARTAN POTASSIUM 50 MG PO TABS: 50.0000 mg | ORAL_TABLET | Freq: Every day | ORAL | 3 refills | Status: DC

## 2020-05-06 NOTE — Progress Notes (Signed)
Subjective:    Patient ID: [name], male    DOB: [birth year], 69 y.o.   MRN: [id number]  HPI  Here with wife after seen per dr Ronnald Ramp with some med changes, now believes not tolerating well.   BP has been variable and not feeling right, as low as 99 sbp, but also 160, on lozol and irbesartan so wife stopped, now back on losartan..   BP Readings from Last 3 Encounters:  05/06/20 130/80  04/26/20 116/70  04/15/20 (!) 158/100  Pt denies chest pain, increased sob or doe, wheezing, orthopnea, PND, increased LE swelling, palpitations, dizziness or syncope.  .Pt denies new neurological symptoms such as new headache, or facial or extremity weakness or numbness   Pt denies polydipsia, polyuria, Also labs with low K and low b12 now for IM replacement   Pt denies fever, wt loss, night sweats, loss of appetite, or other constitutional symptoms Past Medical History:  Diagnosis Date  . Allergic rhinitis   . Anemia, pernicious   . Anxiety and depression    sees Dr. [name]  . Arthritis   . B12 deficiency 01/23/2020  . Catheter-associated urinary tract infection ([place])   . Diverticulosis of colon   . Elevated PSA    and hypogonadism, sees urologist  . GERD (gastroesophageal reflux disease)    hiatal hernia  . Hx of blood transfusion reaction 1999  . Hyperlipidemia   . Hyperparathyroidism ([place])   . Hyperplastic colon polyp   . Hypertension    acei causes cough  . IBS (irritable bowel syndrome)   . Nephrolithiasis   . Obstructive chronic bronchitis without exacerbation, followed by Dr. [name] 03/04/2009   ONO RA  Normal 07/28/11 6 min walk >550m no desat PFTs 08/08/11: DLCO 70% TLC 80%  FeV1 80%   Fef 25 75 56% with significant improvement after BD   . Peritonitis ([place])    after surgery in 1999  . Pneumonia   . PONV (postoperative nausea and vomiting)   . Rectal fissure   . Trigeminal neuralgia    has facial asymetry  . Ventral hernia    Past Surgical History:  Procedure Laterality  Date  . ABDOMINAL SURGERY     Multiple  . BRAIN SURGERY  01/2011   Trigeminal nerve/Dr Vertell Limber  . CHOLECYSTECTOMY  1981  . DENTAL SURGERY  2019   tooth extraction, root canal  . ESOPHAGEAL MANOMETRY N/A 04/28/2020   Procedure: ESOPHAGEAL MANOMETRY (EM);  Surgeon: Mauri Pole, MD;  Location: [facility];  Service: Endoscopy;  Laterality: N/A;  . HAND SURGERY  1973   Left  . HERNIA REPAIR  1999  . KNEE SURGERY  1997   Left  . LITHOTRIPSY     Right  . NISSEN FUNDOPLICATION  3382    complicated by gastric perforation, peritonitis, ventral nernia   . RHIZOTOMY Right 11/17/2014   Procedure: RHIZOTOMY TO RIGHT V2,V3;  Surgeon: Erline Levine, MD;  Location: [facility];  Service: Neurosurgery;  Laterality: Right;  right  . RHIZOTOMY Right 12/22/2014   Procedure: Right V2 and V3 trigeminal rhysolysis;  Surgeon: Erline Levine, MD;  Location: [facility];  Service: Neurosurgery;  Laterality: Right;  Right V2 and V3 trigeminal rhysolysis  . RHIZOTOMY W/ RADIOFREQUENCY ABLATION  08/2017   Dr [name]  . UPPER GASTROINTESTINAL ENDOSCOPY    . VENTRAL HERNIA REPAIR  2004    reports that he quit smoking about 23 years ago. His smoking use included cigarettes. He has a  20.00 pack-year smoking history. He quit smokeless tobacco use about 36 years ago.  His smokeless tobacco use included chew. He reports that he does not drink alcohol and does not use drugs. family history includes Coronary artery disease in his mother; Diabetes in his brother, mother, and sister; Heart disease in his brother and brother; Hypertension in his brother. Allergies  Allergen Reactions  . Dilantin [Phenytoin] Other (See Comments)    bradycardia  . Dilaudid [Hydromorphone Hcl] Nausea And Vomiting  . Sulfa Antibiotics Hives  . Ace Inhibitors Cough  . Augmentin [Amoxicillin-Pot Clavulanate] Nausea And Vomiting  . Codeine Nausea And Vomiting   Current Outpatient Medications on File Prior to Visit  Medication Sig  Dispense Refill  . acetaminophen (TYLENOL) 325 MG tablet Take 2 tablets (650 mg total) by mouth every 6 (six) hours as needed for mild pain, moderate pain or fever.    . ALPRAZolam (XANAX) 1 MG tablet Take 1 mg by mouth at bedtime as needed for anxiety.     . carbamazepine (TEGRETOL XR) 100 MG 12 hr tablet Take 100 mg by mouth 3 (three) times daily.    . cholecalciferol (VITAMIN D3) 25 MCG (1000 UNIT) tablet Take 1,000 Units by mouth daily.    Marland Kitchen dexlansoprazole (DEXILANT) 60 MG capsule Take 1 capsule (60 mg total) by mouth daily. 30 capsule 0  . esomeprazole (NEXIUM) 40 MG capsule Take 40 mg by mouth daily at 12 noon.    . gabapentin (NEURONTIN) 400 MG capsule Take 400 mg by mouth 4 (four) times daily.     Marland Kitchen levocetirizine (XYZAL) 5 MG tablet Take 1 tablet (5 mg total) by mouth every evening. 90 tablet 1  . polyethylene glycol powder (GLYCOLAX/MIRALAX) powder DISSOLVE 1 CAPFUL IN LIQUID EVERY DAY AS NEEDED FOR CONSTIPATION 527 g 2  . potassium chloride SA (KLOR-CON) 20 MEQ tablet Take 1 tablet (20 mEq total) by mouth 2 (two) times daily. 180 tablet 0  . Probiotic Product (ALIGN) 4 MG CAPS Take by mouth daily.    . sucralfate (CARAFATE) 1 GM/10ML suspension Take 10 mLs (1 g total) by mouth 4 (four) times daily -  with meals and at bedtime. 420 mL 0  . Zinc 220 (50 Zn) MG CAPS Take 1 capsule by mouth daily.     No current facility-administered medications on file prior to visit.   Review of Systems All otherwise neg per pt     Objective:   Physical Exam BP 130/80 (BP Location: Left Arm, Patient Position: Sitting, Cuff Size: Large)   Pulse 62   Temp 97.6 F (36.4 C) (Oral)   Ht 5\' 11"  (1.803 m)   Wt 213 lb (96.6 kg)   SpO2 98%   BMI 29.71 kg/m  VS noted,  Constitutional: Pt appears in NAD HENT: Head: NCAT.  Right Ear: External ear normal.  Left Ear: External ear normal.  Eyes: . Pupils are equal, round, and reactive to light. Conjunctivae and EOM are normal Nose: without d/c or  deformity Neck: Neck supple. Gross normal ROM Cardiovascular: Normal rate and regular rhythm.   Pulmonary/Chest: Effort normal and breath sounds without rales or wheezing.  Abd:  Soft, NT, ND, + BS, no organomegaly Neurological: Pt is alert. At baseline orientation, motor grossly intact Skin: Skin is warm. No rashes, other new lesions, no LE edema Psychiatric: Pt behavior is normal without agitation  All otherwise neg per pt Lab Results  Component Value Date   WBC 7.9 04/15/2020   HGB 15.3  04/15/2020   HCT 44.1 04/15/2020   PLT 155.0 04/15/2020   GLUCOSE 98 05/06/2020   CHOL 154 01/23/2020   TRIG 105.0 01/23/2020   HDL 45.60 01/23/2020   LDLCALC 87 01/23/2020   ALT 17 05/06/2020   AST 16 05/06/2020   NA 131 (L) 05/06/2020   K 4.3 05/06/2020   CL 93 (L) 05/06/2020   CREATININE 0.86 05/06/2020   BUN 9 05/06/2020   CO2 33 (H) 05/06/2020   TSH 1.29 01/23/2020   PSA 12.06 (H) 01/23/2020   HGBA1C 5.5 05/06/2020      Assessment & Plan:

## 2020-05-06 NOTE — Patient Instructions (Addendum)
Ok to stop the lozol and avapro from last visit with Dr Ronnald Ramp as you have  Ok to restart the losartan 50 mg as you have  Please take all new medication as prescribed - the nasacort for allergies  You had the B12 shot today, then return monthly for this  Please continue all other medications as before, and refills have been done if requested.  Please have the pharmacy call with any other refills you may need.  Please continue your efforts at being more active, low cholesterol diet, and weight control.  Please keep your appointments with your specialists as you may have planned  Please go to the LAB at the blood drawing area for the tests to be done  You will be contacted by phone if any changes need to be made immediately.  Otherwise, you will receive a letter about your results with an explanation, but please check with MyChart first.  Please remember to sign up for MyChart if you have not done so, as this will be important to you in the future with finding out test results, communicating by private email, and scheduling acute appointments online when needed.

## 2020-05-07 ENCOUNTER — Encounter: Payer: Medicare Other | Admitting: Thoracic Surgery (Cardiothoracic Vascular Surgery)

## 2020-05-09 ENCOUNTER — Encounter: Payer: Self-pay | Admitting: Internal Medicine

## 2020-05-09 NOTE — Assessment & Plan Note (Addendum)
Now stable overall by history and exam on current meds, recent data reviewed with pt, and pt to continue medical treatment as before,  to f/u any worsening symptoms or concerns

## 2020-05-09 NOTE — Assessment & Plan Note (Signed)
Cont same tx, for f/u lab

## 2020-05-09 NOTE — Assessment & Plan Note (Signed)
stable overall by history and exam, recent data reviewed with pt, and pt to continue medical treatment as before,  to f/u any worsening symptoms or concerns  

## 2020-05-09 NOTE — Assessment & Plan Note (Addendum)
To continue b12 im as oral not working well  I spent 31 minutes in preparing to see the patient by review of recent labs, imaging and procedures, obtaining and reviewing separately obtained history, communicating with the patient and family or caregiver, ordering medications, tests or procedures, and documenting clinical information in the EHR including the differential Dx, treatment, and any further evaluation and other management of allergies, b12 deficiency, htn, hypokalemia, hyperglycemia

## 2020-05-09 NOTE — Assessment & Plan Note (Signed)
Improved but still mild, for add nasacort otc prn

## 2020-05-14 ENCOUNTER — Institutional Professional Consult (permissible substitution) (INDEPENDENT_AMBULATORY_CARE_PROVIDER_SITE_OTHER): Payer: Medicare Other | Admitting: Thoracic Surgery (Cardiothoracic Vascular Surgery)

## 2020-05-14 ENCOUNTER — Encounter: Payer: Medicare Other | Admitting: Thoracic Surgery (Cardiothoracic Vascular Surgery)

## 2020-05-14 ENCOUNTER — Other Ambulatory Visit: Payer: Self-pay

## 2020-05-14 VITALS — BP 146/89 | HR 67 | Temp 97.9°F | Resp 20 | Ht 71.0 in | Wt 214.5 lb

## 2020-05-14 DIAGNOSIS — K219 Gastro-esophageal reflux disease without esophagitis: Secondary | ICD-10-CM | POA: Diagnosis not present

## 2020-05-14 DIAGNOSIS — K449 Diaphragmatic hernia without obstruction or gangrene: Secondary | ICD-10-CM | POA: Diagnosis not present

## 2020-05-14 NOTE — Progress Notes (Signed)
[address]       [place] [address]             [phone]                    Merle M [place] Medical Record #[id number] Date of Birth: [birth year]  Referring: Jeffrey Pole, MD Primary Care: [name], MD Primary Cardiologist: Jenkins Rouge, MD  Chief Complaint:    Chief Complaint  Patient presents with  . Hiatal Hernia    Surgical eval, CTA ABD/Pelvis 12/20/19,  UPPER GI SERIES 02/04/20    History of Present Illness:    [name] 69 y.o. male presents for surgical evaluation of chronic gastroesophageal reflux disease.  He has a very complicated history in regards to previous abdominal surgeries.  In 1999 he underwent a Nissen fundoplication for reflux disease which was complicated by an esophageal perforation.  He then was transferred emergently to [facility] where he underwent exploratory laparotomy and multiple washouts.  This hospitalization lasted 4 months.  During this time he developed a significant ventral hernia where attempted closure with mesh was performed between 1999 and 2001.  This mesh became infected and had to be removed.  In 2004 he underwent another failed ventral hernia repair with absorbable mesh at [facility].  Over this entire time he has continued to have significant reflux disease and has been managed by the gastroenterology service with medical therapy.  Despite this he continues to have reflux symptoms as well as some odynophagia.  This occurs on a daily basis.  He is able to tolerate a diet but admits to the use of caffeine, chocolate, alcohol, and tomato sauces.  He was admitted in February of this year with a small bowel obstruction and lost about 10 pounds.  Since recovering from that he has put on about 2 or 3 pounds.      Zubrod Score: At the time of surgery this patient's most appropriate activity status/level should be described as: [x]     0    Normal activity, no symptoms []     1     Restricted in physical strenuous activity but ambulatory, able to do out light work []     2    Ambulatory and capable of self care, unable to do work activities, up and about               >50 % of waking hours                              []     3    Only limited self care, in bed greater than 50% of waking hours []     4    Completely disabled, no self care, confined to bed or chair []     5    Moribund   Past Medical History:  Diagnosis Date  . Allergic rhinitis   . Anemia, pernicious   . Anxiety and depression    sees Dr. [name]  . Arthritis   . B12 deficiency 01/23/2020  . Catheter-associated urinary tract infection ([place])   . Diverticulosis of colon   . Elevated PSA    and hypogonadism, sees urologist  . GERD (gastroesophageal reflux disease)    hiatal hernia  . Hx of blood transfusion reaction 1999  . Hyperlipidemia   . Hyperparathyroidism ([place])   . Hyperplastic colon polyp   .  Hypertension    acei causes cough  . IBS (irritable bowel syndrome)   . Nephrolithiasis   . Obstructive chronic bronchitis without exacerbation, followed by Dr. [name] 03/04/2009   ONO RA  Normal 07/28/11 6 min walk >57m no desat PFTs 08/08/11: DLCO 70% TLC 80%  FeV1 80%   Fef 25 75 56% with significant improvement after BD   . Peritonitis ([place])    after surgery in 1999  . Pneumonia   . PONV (postoperative nausea and vomiting)   . Rectal fissure   . Trigeminal neuralgia    has facial asymetry  . Ventral hernia     Past Surgical History:  Procedure Laterality Date  . ABDOMINAL SURGERY     Multiple  . BRAIN SURGERY  01/2011   Trigeminal nerve/Dr Vertell Limber  . CHOLECYSTECTOMY  1981  . DENTAL SURGERY  2019   tooth extraction, root canal  . ESOPHAGEAL MANOMETRY N/A 04/28/2020   Procedure: ESOPHAGEAL MANOMETRY (EM);  Surgeon: Jeffrey Pole, MD;  Location: [facility];  Service: Endoscopy;  Laterality: N/A;  . HAND SURGERY  1973   Left  . HERNIA REPAIR  1999  . KNEE SURGERY  1997   Left  .  LITHOTRIPSY     Right  . NISSEN FUNDOPLICATION  7681    complicated by gastric perforation, peritonitis, ventral nernia   . RHIZOTOMY Right 11/17/2014   Procedure: RHIZOTOMY TO RIGHT V2,V3;  Surgeon: Erline Levine, MD;  Location: [place] [facility];  Service: Neurosurgery;  Laterality: Right;  right  . RHIZOTOMY Right 12/22/2014   Procedure: Right V2 and V3 trigeminal rhysolysis;  Surgeon: Erline Levine, MD;  Location: [place] [facility];  Service: Neurosurgery;  Laterality: Right;  Right V2 and V3 trigeminal rhysolysis  . RHIZOTOMY W/ RADIOFREQUENCY ABLATION  08/2017   Dr [name]  . UPPER GASTROINTESTINAL ENDOSCOPY    . VENTRAL HERNIA REPAIR  2004    Family History  Problem Relation Age of Onset  . Diabetes Mother   . Coronary artery disease Mother        CABG  . Diabetes Sister   . Diabetes Brother   . Hypertension Brother   . Heart disease Brother   . Heart disease Brother   . Colon cancer Neg Hx   . Esophageal cancer Neg Hx   . Stomach cancer Neg Hx      Social History   Tobacco Use  Smoking Status Former Smoker  . Packs/day: 1.00  . Years: 20.00  . Pack years: 20.00  . Types: Cigarettes  . Quit date: 11/06/1996  . Years since quitting: 23.5  Smokeless Tobacco Former Systems developer  . Types: Chew  . Quit date: 11/07/1983    Social History   Substance and Sexual Activity  Alcohol Use No     Allergies  Allergen Reactions  . Dilantin [Phenytoin] Other (See Comments)    bradycardia  . Dilaudid [Hydromorphone Hcl] Nausea And Vomiting  . Sulfa Antibiotics Hives  . Ace Inhibitors Cough  . Augmentin [Amoxicillin-Pot Clavulanate] Nausea And Vomiting  . Codeine Nausea And Vomiting    Current Outpatient Medications  Medication Sig Dispense Refill  . acetaminophen (TYLENOL) 325 MG tablet Take 2 tablets (650 mg total) by mouth every 6 (six) hours as needed for mild pain, moderate pain or fever.    . ALPRAZolam (XANAX) 1 MG tablet Take 1 mg by mouth at bedtime as needed for anxiety.     .  carbamazepine (TEGRETOL XR) 100 MG 12 hr tablet Take  100 mg by mouth 3 (three) times daily.    . cholecalciferol (VITAMIN D3) 25 MCG (1000 UNIT) tablet Take 1,000 Units by mouth daily.    Marland Kitchen dexlansoprazole (DEXILANT) 60 MG capsule Take 1 capsule (60 mg total) by mouth daily. 30 capsule 0  . esomeprazole (NEXIUM) 40 MG capsule Take 40 mg by mouth daily at 12 noon.    . gabapentin (NEURONTIN) 400 MG capsule Take 400 mg by mouth 4 (four) times daily.     Marland Kitchen losartan-hydrochlorothiazide (HYZAAR) 50-12.5 MG tablet Take 1 tablet by mouth daily.    . polyethylene glycol powder (GLYCOLAX/MIRALAX) powder DISSOLVE 1 CAPFUL IN LIQUID EVERY DAY AS NEEDED FOR CONSTIPATION 527 g 2  . potassium chloride SA (KLOR-CON) 20 MEQ tablet Take 1 tablet (20 mEq total) by mouth 2 (two) times daily. 180 tablet 0  . Probiotic Product (ALIGN) 4 MG CAPS Take by mouth daily.    . sucralfate (CARAFATE) 1 GM/10ML suspension Take 10 mLs (1 g total) by mouth 4 (four) times daily -  with meals and at bedtime. 420 mL 0  . triamcinolone (NASACORT) 55 MCG/ACT AERO nasal inhaler Place 2 sprays into the nose daily. 1 Inhaler 12  . Zinc 220 (50 Zn) MG CAPS Take 1 capsule by mouth daily.     No current facility-administered medications for this visit.    Review of Systems  Constitutional: Negative.   Respiratory: Positive for cough. Negative for shortness of breath.   Cardiovascular: Positive for chest pain.  Gastrointestinal: Positive for abdominal pain, constipation, heartburn and nausea.  Musculoskeletal: Positive for back pain.  Neurological: Negative.      PHYSICAL EXAMINATION: BP (!) 146/89   Pulse 67   Temp 97.9 F (36.6 C) (Temporal)   Resp 20   Ht 5\' 11"  (1.803 m)   Wt 214 lb 8 oz (97.3 kg)   SpO2 98% Comment: RA  BMI 29.92 kg/m  Physical Exam Constitutional:      General: He is not in acute distress.    Appearance: Normal appearance. He is normal weight.  HENT:     Head: Normocephalic and atraumatic.    Eyes:     Extraocular Movements: Extraocular movements intact.     Conjunctiva/sclera: Conjunctivae normal.  Cardiovascular:     Rate and Rhythm: Normal rate and regular rhythm.  Pulmonary:     Effort: Pulmonary effort is normal. No respiratory distress.  Abdominal:     Palpations: Abdomen is soft.     Hernia: A hernia is present.    Musculoskeletal:        General: No swelling. Normal range of motion.     Cervical back: Normal range of motion.  Skin:    General: Skin is warm and dry.  Neurological:     General: No focal deficit present.     Mental Status: He is alert.     Diagnostic Studies & Laboratory data:         I have independently reviewed the above radiology studies  and reviewed the findings with the patient.   Recent Lab Findings: Lab Results  Component Value Date   WBC 7.9 04/15/2020   HGB 15.3 04/15/2020   HCT 44.1 04/15/2020   PLT 155.0 04/15/2020   GLUCOSE 98 05/06/2020   CHOL 154 01/23/2020   TRIG 105.0 01/23/2020   HDL 45.60 01/23/2020   LDLCALC 87 01/23/2020   ALT 17 05/06/2020   AST 16 05/06/2020   NA 131 (L) 05/06/2020   K 4.3  05/06/2020   CL 93 (L) 05/06/2020   CREATININE 0.86 05/06/2020   BUN 9 05/06/2020   CO2 33 (H) 05/06/2020   TSH 1.29 01/23/2020   HGBA1C 5.5 05/06/2020    CT abdomen from February 2021 in December 2018 were reviewed.  He has a large ventral hernia.  Most of his stomach is subdiaphragmatic.  There is not a large hiatal hernia.  Fluoroscopy from 2012 was reviewed.  He has a small sliding hiatal hernia.  There is significant reflux to the upper thoracic esophagus  Manometry from June 2021 was reviewed.  Normal resting pressures with complete relaxation.  Normal esophageal peristalsis.  The upper endoscopy from February 2029 was reviewed.  A 5 cm hiatal hernia was noted. No endoscopic abnormality was evident in the esophagus.   Assessment / Plan:   69 year old male with significant gastroesophageal reflux  disease.  His clinical case is complicated by a previous Nissen fundoplication which unfortunately resulted in a perforation which required an exploratory laparotomy.  He is undergone 3 failed mesh closures and now has a very large ventral hernia.  I had a long discussion with him and his wife and explained to them that a thoracic approach would likely be unsuccessful given the amount of adhesions that he has in his abdominal cavity.  The only option would be for an open abdominal approach and revision of his previous fundoplication to address his ongoing reflux and odynophagia which I think is due to to a slipped Nissen as well.  That portion of the operation would not be very complicated once all the adhesions are lysed.  The more complex portion would be is abdominal wall closure.  This would likely require a complex abdominal wall reconstruction which would need to be done by a general surgeon.  He has a long relationship with Dr. Hassell Done, and per their last discussion he would not be willing to attempt to repair his ventral hernia again.  Globally, Mr. Gleaves is doing well.  He is able to tolerate a diet, and he is not losing weight.  Any surgical intervention to address his reflux would be high risk and quite debilitating for several months.  This will need to be addressed from a multidisciplinary standpoint, but in my opinion surgical intervention at this point would have more risks than reward for this patient.  If he gets to the point where he is no longer able to tolerate a diet, losing significant weight, recurrent upper GI bleeds from Cameron's ulcers or develops metaplastic changes on his endoscopy then we can revisit the option for redo fundoplication, and ventral hernia repair.     I  spent 60 minutes with  the patient face to face and greater then 50% of the time was spent in counseling and coordination of care.    Lajuana Matte 05/14/2020 2:06 PM

## 2020-05-19 ENCOUNTER — Other Ambulatory Visit: Payer: Self-pay

## 2020-05-19 ENCOUNTER — Ambulatory Visit: Payer: Medicare Other

## 2020-05-19 DIAGNOSIS — R0789 Other chest pain: Secondary | ICD-10-CM

## 2020-05-19 DIAGNOSIS — Z9889 Other specified postprocedural states: Secondary | ICD-10-CM

## 2020-06-07 ENCOUNTER — Other Ambulatory Visit: Payer: Self-pay

## 2020-06-07 ENCOUNTER — Ambulatory Visit (INDEPENDENT_AMBULATORY_CARE_PROVIDER_SITE_OTHER): Payer: Medicare Other | Admitting: *Deleted

## 2020-06-07 DIAGNOSIS — E538 Deficiency of other specified B group vitamins: Secondary | ICD-10-CM

## 2020-06-07 MED ORDER — CYANOCOBALAMIN 1000 MCG/ML IJ SOLN
1000.0000 ug | Freq: Once | INTRAMUSCULAR | Status: AC
  Administered 2020-06-07: 1000 ug via INTRAMUSCULAR

## 2020-06-07 NOTE — Progress Notes (Signed)
Pls cosign for B12 inj../lmb  

## 2020-06-07 NOTE — Progress Notes (Signed)
I have reviewed and agree.

## 2020-06-16 DIAGNOSIS — G500 Trigeminal neuralgia: Secondary | ICD-10-CM | POA: Diagnosis not present

## 2020-06-16 DIAGNOSIS — J320 Chronic maxillary sinusitis: Secondary | ICD-10-CM | POA: Diagnosis not present

## 2020-06-16 DIAGNOSIS — M6281 Muscle weakness (generalized): Secondary | ICD-10-CM | POA: Insufficient documentation

## 2020-06-21 ENCOUNTER — Other Ambulatory Visit: Payer: Self-pay

## 2020-06-21 ENCOUNTER — Ambulatory Visit (INDEPENDENT_AMBULATORY_CARE_PROVIDER_SITE_OTHER): Payer: Medicare Other | Admitting: *Deleted

## 2020-06-21 DIAGNOSIS — R42 Dizziness and giddiness: Secondary | ICD-10-CM | POA: Diagnosis not present

## 2020-06-21 DIAGNOSIS — J321 Chronic frontal sinusitis: Secondary | ICD-10-CM | POA: Diagnosis not present

## 2020-06-21 DIAGNOSIS — E538 Deficiency of other specified B group vitamins: Secondary | ICD-10-CM

## 2020-06-21 DIAGNOSIS — M542 Cervicalgia: Secondary | ICD-10-CM | POA: Insufficient documentation

## 2020-06-21 DIAGNOSIS — J342 Deviated nasal septum: Secondary | ICD-10-CM | POA: Diagnosis not present

## 2020-06-21 DIAGNOSIS — Z8669 Personal history of other diseases of the nervous system and sense organs: Secondary | ICD-10-CM | POA: Insufficient documentation

## 2020-06-21 DIAGNOSIS — J3489 Other specified disorders of nose and nasal sinuses: Secondary | ICD-10-CM | POA: Diagnosis not present

## 2020-06-21 DIAGNOSIS — R519 Headache, unspecified: Secondary | ICD-10-CM | POA: Diagnosis not present

## 2020-06-21 MED ORDER — CYANOCOBALAMIN 1000 MCG/ML IJ SOLN
1000.0000 ug | Freq: Once | INTRAMUSCULAR | Status: AC
  Administered 2020-06-21: 1000 ug via INTRAMUSCULAR

## 2020-06-21 NOTE — Progress Notes (Signed)
Pls cosign for B12 inj on abcense of PCP.Marland KitchenJohny Chess

## 2020-07-07 ENCOUNTER — Other Ambulatory Visit: Payer: Self-pay

## 2020-07-07 ENCOUNTER — Telehealth: Payer: Self-pay

## 2020-07-07 ENCOUNTER — Telehealth: Payer: Self-pay | Admitting: Internal Medicine

## 2020-07-07 ENCOUNTER — Ambulatory Visit (INDEPENDENT_AMBULATORY_CARE_PROVIDER_SITE_OTHER): Payer: Medicare Other

## 2020-07-07 DIAGNOSIS — E538 Deficiency of other specified B group vitamins: Secondary | ICD-10-CM

## 2020-07-07 MED ORDER — CYANOCOBALAMIN 1000 MCG/ML IJ SOLN
1000.0000 ug | Freq: Once | INTRAMUSCULAR | Status: AC
  Administered 2020-07-07: 1000 ug via INTRAMUSCULAR

## 2020-07-07 MED ORDER — CYANOCOBALAMIN 1000 MCG/ML IJ SOLN
1000.0000 ug | INTRAMUSCULAR | 3 refills | Status: DC
Start: 2020-07-07 — End: 2020-08-04

## 2020-07-07 NOTE — Telephone Encounter (Signed)
Pt wanting to see if due to COVID could he start getting his b12 sent to the pharmacy and do the shots at home. Please Advise.

## 2020-07-07 NOTE — Progress Notes (Signed)
Pt here for monthly B12 injection per Dr. [name]  B12 1037mcg given IM, and pt tolerated injection well.  Pt is to schedule next monthly B12 injection at check-out.

## 2020-07-07 NOTE — Telephone Encounter (Signed)
Pt's wife informed of below.  

## 2020-07-07 NOTE — Telephone Encounter (Signed)
Ok rx sent to [facility]

## 2020-07-07 NOTE — Telephone Encounter (Signed)
Ok rx sent to his pharmacy

## 2020-07-11 ENCOUNTER — Other Ambulatory Visit: Payer: Self-pay | Admitting: Internal Medicine

## 2020-07-11 NOTE — Telephone Encounter (Signed)
Please refill as per office routine med refill policy (all routine meds refilled for 3 mo or monthly per pt preference up to one year from last visit, then month to month grace period for 3 mo, then further med refills will have to be denied)  

## 2020-07-14 ENCOUNTER — Other Ambulatory Visit: Payer: Self-pay | Admitting: Internal Medicine

## 2020-07-14 MED ORDER — LOSARTAN POTASSIUM-HCTZ 50-12.5 MG PO TABS
1.0000 | ORAL_TABLET | Freq: Every day | ORAL | 3 refills | Status: DC
Start: 2020-07-14 — End: 2021-07-01

## 2020-07-21 ENCOUNTER — Ambulatory Visit: Payer: Medicare Other

## 2020-07-28 DIAGNOSIS — Z6830 Body mass index (BMI) 30.0-30.9, adult: Secondary | ICD-10-CM | POA: Diagnosis not present

## 2020-07-28 DIAGNOSIS — G500 Trigeminal neuralgia: Secondary | ICD-10-CM | POA: Diagnosis not present

## 2020-07-28 DIAGNOSIS — I10 Essential (primary) hypertension: Secondary | ICD-10-CM | POA: Diagnosis not present

## 2020-07-29 ENCOUNTER — Ambulatory Visit: Payer: Medicare Other | Admitting: Internal Medicine

## 2020-07-29 DIAGNOSIS — G500 Trigeminal neuralgia: Secondary | ICD-10-CM | POA: Diagnosis not present

## 2020-08-04 ENCOUNTER — Encounter: Payer: Self-pay | Admitting: Internal Medicine

## 2020-08-04 ENCOUNTER — Other Ambulatory Visit: Payer: Self-pay

## 2020-08-04 ENCOUNTER — Ambulatory Visit (INDEPENDENT_AMBULATORY_CARE_PROVIDER_SITE_OTHER): Payer: Medicare Other | Admitting: Internal Medicine

## 2020-08-04 VITALS — BP 150/90 | HR 54 | Temp 98.6°F | Ht 71.0 in | Wt 216.0 lb

## 2020-08-04 DIAGNOSIS — E538 Deficiency of other specified B group vitamins: Secondary | ICD-10-CM

## 2020-08-04 DIAGNOSIS — I10 Essential (primary) hypertension: Secondary | ICD-10-CM | POA: Diagnosis not present

## 2020-08-04 DIAGNOSIS — F419 Anxiety disorder, unspecified: Secondary | ICD-10-CM

## 2020-08-04 DIAGNOSIS — G500 Trigeminal neuralgia: Secondary | ICD-10-CM | POA: Diagnosis not present

## 2020-08-04 DIAGNOSIS — R739 Hyperglycemia, unspecified: Secondary | ICD-10-CM | POA: Diagnosis not present

## 2020-08-04 DIAGNOSIS — F329 Major depressive disorder, single episode, unspecified: Secondary | ICD-10-CM | POA: Diagnosis not present

## 2020-08-04 DIAGNOSIS — F32A Depression, unspecified: Secondary | ICD-10-CM

## 2020-08-04 MED ORDER — CYANOCOBALAMIN 1000 MCG/ML IJ SOLN: INTRAMUSCULAR | 3 refills | Status: DC

## 2020-08-04 NOTE — Patient Instructions (Addendum)
Ok to do the B12 shots at home every 15 days  Please continue all other medications as before, and refills have been done if requested.  Please have the pharmacy call with any other refills you may need.  Please continue your efforts at being more active, low cholesterol diet, and weight control..  Please keep your appointments with your specialists as you may have planned - Dr Vertell Limber  Please continue to monitor your BP at home, with the goal to be at least less than 140/90 most of the time  Please make an Appointment to return in 6 months, or sooner if needed

## 2020-08-04 NOTE — Progress Notes (Signed)
Subjective:    Patient ID: [name], male    DOB: 11-08-1950, 69 y.o.   MRN: [id number]  HPI Here to f/u; overall doing ok,  Pt denies chest pain, increasing sob or doe, wheezing, orthopnea, PND, increased LE swelling, palpitations, dizziness or syncope.  Pt denies new neurological symptoms such as new headache, or facial or extremity weakness or numbness.  Pt denies polydipsia, polyuria, or low sugar episode.  Pt states overall good compliance with meds, mostly trying to follow appropriate diet, with wt overall stable,  but little exercise however. S/p 5th ablation for right Trigeminal neuralgia per Dr Vertell Limber.  Overall feels better energy to do b12 shot every 2 wks.  BP at home more often < 140/90.  No new complaints  Denies worsening depressive symptoms, suicidal ideation, or panic; has ongoing anxiety, Past Medical History:  Diagnosis Date  . Allergic rhinitis   . Anemia, pernicious   . Anxiety and depression    sees Dr. [name]  . Arthritis   . B12 deficiency 01/23/2020  . Catheter-associated urinary tract infection ([place])   . Diverticulosis of colon   . Elevated PSA    and hypogonadism, sees urologist  . GERD (gastroesophageal reflux disease)    hiatal hernia  . Hx of blood transfusion reaction 1999  . Hyperlipidemia   . Hyperparathyroidism ([employer])   . Hyperplastic colon polyp   . Hypertension    acei causes cough  . IBS (irritable bowel syndrome)   . Nephrolithiasis   . Obstructive chronic bronchitis without exacerbation, followed by Dr. [name] 03/04/2009   ONO RA  Normal 07/28/11 6 min walk >562m no desat PFTs 08/08/11: DLCO 70% TLC 80%  FeV1 80%   Fef 25 75 56% with significant improvement after BD   . Peritonitis ([place])    after surgery in 1999  . Pneumonia   . PONV (postoperative nausea and vomiting)   . Rectal fissure   . Trigeminal neuralgia    has facial asymetry  . Ventral hernia    Past Surgical History:  Procedure Laterality Date  . ABDOMINAL SURGERY      Multiple  . BRAIN SURGERY  01/2011   Trigeminal nerve/Dr Vertell Limber  . CHOLECYSTECTOMY  1981  . DENTAL SURGERY  2019   tooth extraction, root canal  . ESOPHAGEAL MANOMETRY N/A 04/28/2020   Procedure: ESOPHAGEAL MANOMETRY (EM);  Surgeon: Mauri Pole, MD;  Location: [facility];  Service: Endoscopy;  Laterality: N/A;  . HAND SURGERY  1973   Left  . HERNIA REPAIR  1999  . KNEE SURGERY  1997   Left  . LITHOTRIPSY     Right  . NISSEN FUNDOPLICATION  5573    complicated by gastric perforation, peritonitis, ventral nernia   . RHIZOTOMY Right 11/17/2014   Procedure: RHIZOTOMY TO RIGHT V2,V3;  Surgeon: Erline Levine, MD;  Location: [place] [facility];  Service: Neurosurgery;  Laterality: Right;  right  . RHIZOTOMY Right 12/22/2014   Procedure: Right V2 and V3 trigeminal rhysolysis;  Surgeon: Erline Levine, MD;  Location: [place] [facility];  Service: Neurosurgery;  Laterality: Right;  Right V2 and V3 trigeminal rhysolysis  . RHIZOTOMY W/ RADIOFREQUENCY ABLATION  08/2017   Dr [name]  . UPPER GASTROINTESTINAL ENDOSCOPY    . VENTRAL HERNIA REPAIR  2004    reports that he quit smoking about 23 years ago. His smoking use included cigarettes. He has a 20.00 pack-year smoking history. He quit smokeless tobacco use about 36 years ago.  His  smokeless tobacco use included chew. He reports that he does not drink alcohol and does not use drugs. family history includes Coronary artery disease in his mother; Diabetes in his brother, mother, and sister; Heart disease in his brother and brother; Hypertension in his brother. Allergies  Allergen Reactions  . Dilantin [Phenytoin] Other (See Comments)    bradycardia  . Dilaudid [Hydromorphone Hcl] Nausea And Vomiting  . Sulfa Antibiotics Hives  . Ace Inhibitors Cough  . Augmentin [Amoxicillin-Pot Clavulanate] Nausea And Vomiting  . Codeine Nausea And Vomiting   Current Outpatient Medications on File Prior to Visit  Medication Sig Dispense Refill  . acetaminophen  (TYLENOL) 325 MG tablet Take 2 tablets (650 mg total) by mouth every 6 (six) hours as needed for mild pain, moderate pain or fever.    . ALPRAZolam (XANAX) 1 MG tablet Take 1 mg by mouth at bedtime as needed for anxiety.     . carbamazepine (TEGRETOL XR) 100 MG 12 hr tablet Take 100 mg by mouth 3 (three) times daily.    . cholecalciferol (VITAMIN D3) 25 MCG (1000 UNIT) tablet Take 1,000 Units by mouth daily.    Marland Kitchen dexlansoprazole (DEXILANT) 60 MG capsule Take 1 capsule (60 mg total) by mouth daily. 30 capsule 0  . esomeprazole (NEXIUM) 40 MG capsule Take 40 mg by mouth daily at 12 noon.    . gabapentin (NEURONTIN) 400 MG capsule Take 400 mg by mouth 4 (four) times daily.     . irbesartan (AVAPRO) 300 MG tablet Take by mouth.    . levocetirizine (XYZAL) 5 MG tablet Take by mouth.    . losartan-hydrochlorothiazide (HYZAAR) 50-12.5 MG tablet Take 1 tablet by mouth daily. 90 tablet 3  . pantoprazole (PROTONIX) 40 MG tablet Take by mouth.    . polyethylene glycol powder (GLYCOLAX/MIRALAX) powder DISSOLVE 1 CAPFUL IN LIQUID EVERY DAY AS NEEDED FOR CONSTIPATION 527 g 2  . potassium chloride SA (KLOR-CON) 20 MEQ tablet Take 1 tablet (20 mEq total) by mouth 2 (two) times daily. 180 tablet 0  . Probiotic Product (ALIGN) 4 MG CAPS Take by mouth daily.    . sucralfate (CARAFATE) 1 GM/10ML suspension Take 10 mLs (1 g total) by mouth 4 (four) times daily -  with meals and at bedtime. 420 mL 0  . tamsulosin (FLOMAX) 0.4 MG CAPS capsule Take 0.4 mg by mouth daily.    Marland Kitchen triamcinolone (NASACORT) 55 MCG/ACT AERO nasal inhaler Place 2 sprays into the nose daily. 1 Inhaler 12  . Zinc 220 (50 Zn) MG CAPS Take 1 capsule by mouth daily.     No current facility-administered medications on file prior to visit.   Review of Systems All otherwise neg per pt    Objective:   Physical Exam BP (!) 150/90 (BP Location: Left Arm, Patient Position: Sitting, Cuff Size: Large)   Pulse (!) 54   Temp 98.6 F (37 C) (Oral)   Ht  5\' 11"  (1.803 m)   Wt 216 lb (98 kg)   SpO2 98%   BMI 30.13 kg/m  VS noted,  Constitutional: Pt appears in NAD HENT: Head: NCAT.  Right Ear: External ear normal.  Left Ear: External ear normal.  Eyes: . Pupils are equal, round, and reactive to light. Conjunctivae and EOM are normal Nose: without d/c or deformity Neck: Neck supple. Gross normal ROM Cardiovascular: Normal rate and regular rhythm.   Pulmonary/Chest: Effort normal and breath sounds without rales or wheezing.  Abd:  Soft, NT, ND, +  BS, no organomegaly Neurological: Pt is alert. At baseline orientation, motor grossly intact Skin: Skin is warm. No rashes, other new lesions, no LE edema Psychiatric: Pt behavior is normal without agitation  All otherwise neg per pt Lab Results  Component Value Date   WBC 7.9 04/15/2020   HGB 15.3 04/15/2020   HCT 44.1 04/15/2020   PLT 155.0 04/15/2020   GLUCOSE 98 05/06/2020   CHOL 154 01/23/2020   TRIG 105.0 01/23/2020   HDL 45.60 01/23/2020   LDLCALC 87 01/23/2020   ALT 17 05/06/2020   AST 16 05/06/2020   NA 131 (L) 05/06/2020   K 4.3 05/06/2020   CL 93 (L) 05/06/2020   CREATININE 0.86 05/06/2020   BUN 9 05/06/2020   CO2 33 (H) 05/06/2020   TSH 1.29 01/23/2020   PSA 12.06 (H) 01/23/2020   HGBA1C 5.5 05/06/2020      Assessment & Plan:

## 2020-08-08 ENCOUNTER — Encounter: Payer: Self-pay | Admitting: Internal Medicine

## 2020-08-08 NOTE — Assessment & Plan Note (Addendum)
Ok for q 2 wk b12 IM  I spent 31 minutes in preparing to see the patient by review of recent labs, imaging and procedures, obtaining and reviewing separately obtained history, communicating with the patient and family or caregiver, ordering medications, tests or procedures, and documenting clinical information in the EHR including the differential Dx, treatment, and any further evaluation and other management of b12 def, anxiety depresion, trigeminal neuralgia, htn, hyperglycemia,

## 2020-08-08 NOTE — Assessment & Plan Note (Signed)
stable overall by history and exam, recent data reviewed with pt, and pt to continue medical treatment as before,  to f/u any worsening symptoms or concerns  

## 2020-09-07 DIAGNOSIS — Z23 Encounter for immunization: Secondary | ICD-10-CM | POA: Diagnosis not present

## 2020-10-12 DIAGNOSIS — N411 Chronic prostatitis: Secondary | ICD-10-CM | POA: Diagnosis not present

## 2020-10-12 DIAGNOSIS — R972 Elevated prostate specific antigen [PSA]: Secondary | ICD-10-CM | POA: Diagnosis not present

## 2020-10-12 DIAGNOSIS — N401 Enlarged prostate with lower urinary tract symptoms: Secondary | ICD-10-CM | POA: Diagnosis not present

## 2020-10-12 DIAGNOSIS — R350 Frequency of micturition: Secondary | ICD-10-CM | POA: Diagnosis not present

## 2020-10-18 DIAGNOSIS — G500 Trigeminal neuralgia: Secondary | ICD-10-CM | POA: Diagnosis not present

## 2020-10-19 DIAGNOSIS — Z23 Encounter for immunization: Secondary | ICD-10-CM | POA: Diagnosis not present

## 2020-11-02 DIAGNOSIS — R350 Frequency of micturition: Secondary | ICD-10-CM | POA: Diagnosis not present

## 2020-11-02 DIAGNOSIS — N401 Enlarged prostate with lower urinary tract symptoms: Secondary | ICD-10-CM | POA: Diagnosis not present

## 2020-11-02 DIAGNOSIS — N411 Chronic prostatitis: Secondary | ICD-10-CM | POA: Diagnosis not present

## 2020-11-18 DIAGNOSIS — R1032 Left lower quadrant pain: Secondary | ICD-10-CM | POA: Diagnosis not present

## 2020-11-18 DIAGNOSIS — R350 Frequency of micturition: Secondary | ICD-10-CM | POA: Diagnosis not present

## 2020-11-29 ENCOUNTER — Ambulatory Visit
Admission: RE | Admit: 2020-11-29 | Discharge: 2020-11-29 | Disposition: A | Discharge status: Home / Self Care | Payer: Medicare Other | Source: Ambulatory Visit | Attending: Orthopedic Surgery | Admitting: Orthopedic Surgery

## 2020-11-29 ENCOUNTER — Other Ambulatory Visit: Payer: Self-pay | Admitting: Orthopedic Surgery

## 2020-11-29 DIAGNOSIS — M542 Cervicalgia: Secondary | ICD-10-CM | POA: Diagnosis not present

## 2020-11-29 DIAGNOSIS — M4802 Spinal stenosis, cervical region: Secondary | ICD-10-CM | POA: Diagnosis not present

## 2020-11-29 DIAGNOSIS — S129XXA Fracture of neck, unspecified, initial encounter: Secondary | ICD-10-CM | POA: Diagnosis not present

## 2020-11-29 DIAGNOSIS — Z9889 Other specified postprocedural states: Secondary | ICD-10-CM | POA: Diagnosis not present

## 2020-12-15 DIAGNOSIS — S161XXA Strain of muscle, fascia and tendon at neck level, initial encounter: Secondary | ICD-10-CM | POA: Insufficient documentation

## 2020-12-15 DIAGNOSIS — M542 Cervicalgia: Secondary | ICD-10-CM | POA: Diagnosis not present

## 2020-12-15 DIAGNOSIS — I10 Essential (primary) hypertension: Secondary | ICD-10-CM | POA: Diagnosis not present

## 2020-12-15 DIAGNOSIS — G500 Trigeminal neuralgia: Secondary | ICD-10-CM | POA: Diagnosis not present

## 2020-12-24 ENCOUNTER — Emergency Department (HOSPITAL_COMMUNITY): Payer: Medicare Other

## 2020-12-24 ENCOUNTER — Inpatient Hospital Stay (HOSPITAL_COMMUNITY)
Admission: EM | Admit: 2020-12-24 | Discharge: 2020-12-27 | DRG: 390 | Disposition: A | Discharge status: Home / Self Care | Payer: Medicare Other | Attending: General Surgery | Admitting: General Surgery

## 2020-12-24 ENCOUNTER — Other Ambulatory Visit: Payer: Self-pay

## 2020-12-24 ENCOUNTER — Encounter (HOSPITAL_COMMUNITY): Payer: Self-pay | Admitting: Emergency Medicine

## 2020-12-24 ENCOUNTER — Inpatient Hospital Stay (HOSPITAL_COMMUNITY): Payer: Medicare Other

## 2020-12-24 DIAGNOSIS — G500 Trigeminal neuralgia: Secondary | ICD-10-CM | POA: Diagnosis present

## 2020-12-24 DIAGNOSIS — J449 Chronic obstructive pulmonary disease, unspecified: Secondary | ICD-10-CM | POA: Diagnosis not present

## 2020-12-24 DIAGNOSIS — F32A Depression, unspecified: Secondary | ICD-10-CM | POA: Diagnosis present

## 2020-12-24 DIAGNOSIS — Z20822 Contact with and (suspected) exposure to covid-19: Secondary | ICD-10-CM | POA: Diagnosis not present

## 2020-12-24 DIAGNOSIS — K219 Gastro-esophageal reflux disease without esophagitis: Secondary | ICD-10-CM | POA: Diagnosis present

## 2020-12-24 DIAGNOSIS — R1084 Generalized abdominal pain: Secondary | ICD-10-CM | POA: Diagnosis not present

## 2020-12-24 DIAGNOSIS — Z882 Allergy status to sulfonamides status: Secondary | ICD-10-CM

## 2020-12-24 DIAGNOSIS — Z87442 Personal history of urinary calculi: Secondary | ICD-10-CM

## 2020-12-24 DIAGNOSIS — Z888 Allergy status to other drugs, medicaments and biological substances status: Secondary | ICD-10-CM | POA: Diagnosis not present

## 2020-12-24 DIAGNOSIS — Z79899 Other long term (current) drug therapy: Secondary | ICD-10-CM

## 2020-12-24 DIAGNOSIS — Z8719 Personal history of other diseases of the digestive system: Secondary | ICD-10-CM | POA: Diagnosis not present

## 2020-12-24 DIAGNOSIS — J4489 Other specified chronic obstructive pulmonary disease: Secondary | ICD-10-CM | POA: Diagnosis present

## 2020-12-24 DIAGNOSIS — K5669 Other partial intestinal obstruction: Secondary | ICD-10-CM | POA: Diagnosis not present

## 2020-12-24 DIAGNOSIS — K6389 Other specified diseases of intestine: Secondary | ICD-10-CM | POA: Diagnosis not present

## 2020-12-24 DIAGNOSIS — Z87891 Personal history of nicotine dependence: Secondary | ICD-10-CM

## 2020-12-24 DIAGNOSIS — E785 Hyperlipidemia, unspecified: Secondary | ICD-10-CM | POA: Diagnosis present

## 2020-12-24 DIAGNOSIS — K439 Ventral hernia without obstruction or gangrene: Secondary | ICD-10-CM | POA: Diagnosis present

## 2020-12-24 DIAGNOSIS — K589 Irritable bowel syndrome without diarrhea: Secondary | ICD-10-CM | POA: Diagnosis not present

## 2020-12-24 DIAGNOSIS — Z833 Family history of diabetes mellitus: Secondary | ICD-10-CM

## 2020-12-24 DIAGNOSIS — K449 Diaphragmatic hernia without obstruction or gangrene: Secondary | ICD-10-CM | POA: Diagnosis not present

## 2020-12-24 DIAGNOSIS — F419 Anxiety disorder, unspecified: Secondary | ICD-10-CM | POA: Diagnosis present

## 2020-12-24 DIAGNOSIS — K56600 Partial intestinal obstruction, unspecified as to cause: Secondary | ICD-10-CM | POA: Diagnosis not present

## 2020-12-24 DIAGNOSIS — J309 Allergic rhinitis, unspecified: Secondary | ICD-10-CM | POA: Diagnosis present

## 2020-12-24 DIAGNOSIS — Z885 Allergy status to narcotic agent status: Secondary | ICD-10-CM

## 2020-12-24 DIAGNOSIS — E213 Hyperparathyroidism, unspecified: Secondary | ICD-10-CM | POA: Diagnosis not present

## 2020-12-24 DIAGNOSIS — Z4682 Encounter for fitting and adjustment of non-vascular catheter: Secondary | ICD-10-CM | POA: Diagnosis not present

## 2020-12-24 DIAGNOSIS — I10 Essential (primary) hypertension: Secondary | ICD-10-CM | POA: Diagnosis not present

## 2020-12-24 DIAGNOSIS — Z8616 Personal history of COVID-19: Secondary | ICD-10-CM

## 2020-12-24 DIAGNOSIS — Z8249 Family history of ischemic heart disease and other diseases of the circulatory system: Secondary | ICD-10-CM | POA: Diagnosis not present

## 2020-12-24 DIAGNOSIS — R109 Unspecified abdominal pain: Secondary | ICD-10-CM | POA: Diagnosis not present

## 2020-12-24 DIAGNOSIS — K56609 Unspecified intestinal obstruction, unspecified as to partial versus complete obstruction: Secondary | ICD-10-CM

## 2020-12-24 DIAGNOSIS — R1110 Vomiting, unspecified: Secondary | ICD-10-CM | POA: Diagnosis not present

## 2020-12-24 DIAGNOSIS — Z0189 Encounter for other specified special examinations: Secondary | ICD-10-CM

## 2020-12-24 LAB — COMPREHENSIVE METABOLIC PANEL
ALT: 11 U/L (ref 0–44)
AST: 18 U/L (ref 15–41)
Albumin: 4.1 g/dL (ref 3.5–5.0)
Alkaline Phosphatase: 71 U/L (ref 38–126)
Anion gap: 11 (ref 5–15)
BUN: 12 mg/dL (ref 8–23)
CO2: 28 mmol/L (ref 22–32)
Calcium: 9.4 mg/dL (ref 8.9–10.3)
Chloride: 96 mmol/L — ABNORMAL LOW (ref 98–111)
Creatinine, Ser: 0.89 mg/dL (ref 0.61–1.24)
GFR, Estimated: >60 mL/min (ref >60)
Glucose, Bld: 101 mg/dL — ABNORMAL HIGH (ref 70–99)
Potassium: 4 mmol/L (ref 3.5–5.1)
Sodium: 135 mmol/L (ref 135–145)
Total Bilirubin: 0.5 mg/dL (ref 0.3–1.2)
Total Protein: 7.5 g/dL (ref 6.5–8.1)

## 2020-12-24 LAB — CBC
HCT: 50.9 % (ref 39.0–52.0)
Hemoglobin: 17.4 g/dL — ABNORMAL HIGH (ref 13.0–17.0)
MCH: 30.9 pg (ref 26.0–34.0)
MCHC: 34.2 g/dL (ref 30.0–36.0)
MCV: 90.2 fL (ref 80.0–100.0)
Platelets: 167 10*3/uL (ref 150–400)
RBC: 5.64 MIL/uL (ref 4.22–5.81)
RDW: 12.8 % (ref 11.5–15.5)
WBC: 12 10*3/uL — ABNORMAL HIGH (ref 4.0–10.5)
nRBC: 0 % (ref 0.0–0.2)

## 2020-12-24 LAB — URINALYSIS, ROUTINE W REFLEX MICROSCOPIC
Bacteria, UA: NONE SEEN
Bilirubin Urine: NEGATIVE
Glucose, UA: NEGATIVE mg/dL
Hgb urine dipstick: NEGATIVE
Ketones, ur: NEGATIVE mg/dL
Nitrite: NEGATIVE
Protein, ur: NEGATIVE mg/dL
Specific Gravity, Urine: 1.02 (ref 1.005–1.030)
pH: 7 (ref 5.0–8.0)

## 2020-12-24 LAB — LIPASE, BLOOD: Lipase: 23 U/L (ref 11–51)

## 2020-12-24 LAB — HIV ANTIBODY (ROUTINE TESTING W REFLEX): HIV Screen 4th Generation wRfx: NONREACTIVE

## 2020-12-24 LAB — SARS CORONAVIRUS 2 (TAT 6-24 HRS): SARS Coronavirus 2: NEGATIVE

## 2020-12-24 MED ORDER — ONDANSETRON HCL 4 MG/2ML IJ SOLN
4.0000 mg | Freq: Four times a day (QID) | INTRAMUSCULAR | Status: DC | PRN
  Administered 2020-12-24 (×2): 4 mg via INTRAVENOUS
  Filled 2020-12-24 (×2): qty 2

## 2020-12-24 MED ORDER — KCL IN DEXTROSE-NACL 10-5-0.45 MEQ/L-%-% IV SOLN
INTRAVENOUS | Status: DC
  Filled 2020-12-24 (×8): qty 1000

## 2020-12-24 MED ORDER — PROMETHAZINE HCL 25 MG/ML IJ SOLN
12.5000 mg | INTRAMUSCULAR | Status: DC | PRN
  Administered 2020-12-24: 12.5 mg via INTRAVENOUS
  Filled 2020-12-24: qty 1

## 2020-12-24 MED ORDER — DIPHENHYDRAMINE HCL 12.5 MG/5ML PO ELIX: 12.5000 mg | ORAL_SOLUTION | Freq: Four times a day (QID) | ORAL | Status: DC | PRN

## 2020-12-24 MED ORDER — ONDANSETRON 4 MG PO TBDP: 4.0000 mg | ORAL_TABLET | Freq: Four times a day (QID) | ORAL | Status: DC | PRN

## 2020-12-24 MED ORDER — FENTANYL CITRATE (PF) 100 MCG/2ML IJ SOLN
150.0000 ug | Freq: Once | INTRAMUSCULAR | Status: AC
  Administered 2020-12-24: 150 ug via INTRAVENOUS
  Filled 2020-12-24: qty 4

## 2020-12-24 MED ORDER — PANTOPRAZOLE SODIUM 40 MG IV SOLR
40.0000 mg | Freq: Every day | INTRAVENOUS | Status: DC
  Administered 2020-12-24 – 2020-12-25 (×2): 40 mg via INTRAVENOUS
  Filled 2020-12-24 (×2): qty 40

## 2020-12-24 MED ORDER — ENOXAPARIN SODIUM 40 MG/0.4ML ~~LOC~~ SOLN
40.0000 mg | SUBCUTANEOUS | Status: DC
  Administered 2020-12-25 – 2020-12-27 (×3): 40 mg via SUBCUTANEOUS
  Filled 2020-12-24 (×3): qty 0.4

## 2020-12-24 MED ORDER — ONDANSETRON HCL 4 MG/2ML IJ SOLN
4.0000 mg | Freq: Once | INTRAMUSCULAR | Status: AC
  Administered 2020-12-24: 4 mg via INTRAVENOUS
  Filled 2020-12-24: qty 2

## 2020-12-24 MED ORDER — DIPHENHYDRAMINE HCL 50 MG/ML IJ SOLN: 12.5000 mg | Freq: Four times a day (QID) | INTRAMUSCULAR | Status: DC | PRN

## 2020-12-24 MED ORDER — SODIUM CHLORIDE 0.9 % IV BOLUS
1000.0000 mL | Freq: Once | INTRAVENOUS | Status: AC
  Administered 2020-12-24: 1000 mL via INTRAVENOUS

## 2020-12-24 MED ORDER — IOHEXOL 300 MG/ML  SOLN
100.0000 mL | Freq: Once | INTRAMUSCULAR | Status: AC | PRN
  Administered 2020-12-24: 100 mL via INTRAVENOUS

## 2020-12-24 MED ORDER — DIATRIZOATE MEGLUMINE & SODIUM 66-10 % PO SOLN
90.0000 mL | Freq: Once | ORAL | Status: AC
  Administered 2020-12-25: 90 mL via NASOGASTRIC
  Filled 2020-12-24 (×2): qty 90

## 2020-12-24 MED ORDER — MORPHINE SULFATE (PF) 2 MG/ML IV SOLN
2.0000 mg | INTRAVENOUS | Status: DC | PRN
  Administered 2020-12-24: 2 mg via INTRAVENOUS
  Filled 2020-12-24: qty 1

## 2020-12-24 MED ORDER — FENTANYL CITRATE (PF) 100 MCG/2ML IJ SOLN
100.0000 ug | Freq: Once | INTRAMUSCULAR | Status: AC
  Administered 2020-12-24: 100 ug via INTRAVENOUS
  Filled 2020-12-24: qty 2

## 2020-12-24 MED ORDER — METHOCARBAMOL 1000 MG/10ML IJ SOLN
500.0000 mg | Freq: Four times a day (QID) | INTRAVENOUS | Status: DC | PRN
  Filled 2020-12-24: qty 5

## 2020-12-24 MED ORDER — HYDRALAZINE HCL 20 MG/ML IJ SOLN
10.0000 mg | INTRAMUSCULAR | Status: DC | PRN
Start: 2020-12-24 — End: 2020-12-27

## 2020-12-24 NOTE — ED Notes (Signed)
Pt is very nauseous and is dry heaving and sitting straight up (suction will pull gastric contents when this occurs.) Pt is req more nausea medicine because zofran is not working. Will send page to provider.

## 2020-12-24 NOTE — ED Triage Notes (Signed)
Pt arrives to ED with chief complaint abdominal pain generalized over entire abd with radiation into lower back, hx of bowel blockage.

## 2020-12-24 NOTE — ED Notes (Signed)
Advanced NG tube 8 cm per MD Lov[place]. Gave pt nausea medicine.

## 2020-12-24 NOTE — H&P (Addendum)
[facility] Admission Note  [name] [facility] 1951/09/06  [id number].    Requesting MD: Gareth Morgan MD Chief Complaint/Reason for Consult: recurrent pSBO  HPI:  [name]  is a 70 year old male who has history of  hypertension, GERD, pSBO, with a past surgical history significant for Nissen fundoplication 0630, multiple post operative operations for complications, multiple subsequent ventral hernia repairs, and now with a slipped Nissen. His cc is acute abdominal pain that woke him up from his sleep at 0300. Described as central, sharp abdominal pain with radiation to his flank bilaterally. Associated sxs incliude abdominal distention and nausea. Reports similar pain in the past when he was admitted with pSBO about one year ago (12/2019). He Denies emesis. Last BM was yesterday. Denies flatus or BM since yesterday. At baseline he takes miralax daily and has one semi-solid, non-bloody BM daily. He denies fever, chills, CP, SOB, urinary sxs, hematochezia, or melena. Prior to this episode of pain he was tolerating a regular diet - multiple small meals daily, avoiding heavy, greasy foods.  He is followed by Dr. Hassell Done in our [place] [facility] who did not recommend re-attempt at nissen or ventral hernia repair. He has also seen CT surgery for consideration of redo Nissen (Dr. Kipp Brood) and was not offered an operation as a transthoracic approach would likely be unsuccessful due to adhesions in the upper abdomen. He is a former smoker who quit around 1998. Denies alcohol or drug use. He is retired and lives with his wife in [place], [place]. At baseline he mobilizes without an assistive device and exercises 30-45 minutes on the elliptical daily without chest pain.   ROS: As Above  Review of Systems  All other systems reviewed and are negative.   Family History  Problem Relation Age of Onset  . Diabetes Mother   . Coronary artery disease Mother        CABG  . Diabetes  Sister   . Diabetes Brother   . Hypertension Brother   . Heart disease Brother   . Heart disease Brother   . Colon cancer Neg Hx   . Esophageal cancer Neg Hx   . Stomach cancer Neg Hx     Past Medical History:  Diagnosis Date  . Allergic rhinitis   . Anemia, pernicious   . Anxiety and depression    sees Dr. [name]  . Arthritis   . B12 deficiency 01/23/2020  . Catheter-associated urinary tract infection ([place])   . Diverticulosis of colon   . Elevated PSA    and hypogonadism, sees urologist  . GERD (gastroesophageal reflux disease)    hiatal hernia  . Hx of blood transfusion reaction 1999  . Hyperlipidemia   . Hyperparathyroidism ([place])   . Hyperplastic colon polyp   . Hypertension    acei causes cough  . IBS (irritable bowel syndrome)   . Nephrolithiasis   . Obstructive chronic bronchitis without exacerbation, followed by Dr. [name] 03/04/2009   ONO RA  Normal 07/28/11 6 min walk >565m no desat PFTs 08/08/11: DLCO 70% TLC 80%  FeV1 80%   Fef 25 75 56% with significant improvement after BD   . Peritonitis ([place])    after surgery in 1999  . Pneumonia   . PONV (postoperative nausea and vomiting)   . Rectal fissure   . Trigeminal neuralgia    has facial asymetry  . Ventral hernia     Past Surgical History:  Procedure Laterality Date  . ABDOMINAL SURGERY  Multiple  . BRAIN SURGERY  01/2011   Trigeminal nerve/Dr Vertell Limber  . CHOLECYSTECTOMY  1981  . DENTAL SURGERY  2019   tooth extraction, root canal  . ESOPHAGEAL MANOMETRY N/A 04/28/2020   Procedure: ESOPHAGEAL MANOMETRY (EM);  Surgeon: Mauri Pole, MD;  Location: [facility];  Service: Endoscopy;  Laterality: N/A;  . HAND SURGERY  1973   Left  . HERNIA REPAIR  1999  . KNEE SURGERY  1997   Left  . LITHOTRIPSY     Right  . NISSEN FUNDOPLICATION  6256    complicated by gastric perforation, peritonitis, ventral nernia   . RHIZOTOMY Right 11/17/2014   Procedure: RHIZOTOMY TO RIGHT V2,V3;  Surgeon: Erline Levine, MD;   Location: [facility];  Service: Neurosurgery;  Laterality: Right;  right  . RHIZOTOMY Right 12/22/2014   Procedure: Right V2 and V3 trigeminal rhysolysis;  Surgeon: Erline Levine, MD;  Location: [facility];  Service: Neurosurgery;  Laterality: Right;  Right V2 and V3 trigeminal rhysolysis  . RHIZOTOMY W/ RADIOFREQUENCY ABLATION  08/2017   Dr [name]  . UPPER GASTROINTESTINAL ENDOSCOPY    . VENTRAL HERNIA REPAIR  2004    Social History:  reports that he quit smoking about 24 years ago. His smoking use included cigarettes. He has a 20.00 pack-year smoking history. He quit smokeless tobacco use about 37 years ago.  His smokeless tobacco use included chew. He reports that he does not drink alcohol and does not use drugs.  Allergies:  Allergies  Allergen Reactions  . Dilantin [Phenytoin] Other (See Comments)    bradycardia  . Dilaudid [Hydromorphone Hcl] Nausea And Vomiting  . Sulfa Antibiotics Hives  . Ace Inhibitors Cough  . Augmentin [Amoxicillin-Pot Clavulanate] Nausea And Vomiting  . Codeine Nausea And Vomiting    (Not in a hospital admission)   Blood pressure 133/86, pulse 76, temperature 98.3 F (36.8 C), temperature source Oral, resp. rate 14, SpO2 96 %. Physical Exam: Constitutional: NAD; conversant; no deformities Eyes: Moist conjunctiva; no lid lag; anicteric; PERRL Neck: Trachea midline; no thyromegaly Lungs: Normal respiratory effort; no tactile fremitus CV: RRR; no palpable thrills; no pitting edema GI: Abd soft, distended, hypoactive bowel sounds, ventral hernia with palpable underlying dilated small bowel - reducible, no palpable hepatosplenomegaly, previous laparotomy scars  MSK: Normal gait; no clubbing/cyanosis Psychiatric: Appropriate affect; alert and oriented x3 Lymphatic: No palpable cervical or axillary lymphadenopathy  Results for orders placed or performed during the hospital encounter of 12/24/20 (from the past 48 hour(s))  Lipase, blood     Status:  None   Collection Time: 12/24/20  8:24 AM  Result Value Ref Range   Lipase 23 11 - 51 U/L    Comment: Performed at [facility], [place] [address] [place], [place] [address]  Comprehensive metabolic panel     Status: Abnormal   Collection Time: 12/24/20  8:24 AM  Result Value Ref Range   Sodium 135 135 - 145 mmol/L   Potassium 4.0 3.5 - 5.1 mmol/L   Chloride 96 (L) 98 - 111 mmol/L   CO2 28 22 - 32 mmol/L   Glucose, Bld 101 (H) 70 - 99 mg/dL    Comment: Glucose reference range applies only to samples taken after fasting for at least 8 hours.   BUN 12 8 - 23 mg/dL   Creatinine, Ser 0.89 0.61 - 1.24 mg/dL   Calcium 9.4 8.9 - 10.3 mg/dL   Total Protein 7.5 6.5 - 8.1 g/dL  Albumin 4.1 3.5 - 5.0 g/dL   AST 18 15 - 41 U/L   ALT 11 0 - 44 U/L   Alkaline Phosphatase 71 38 - 126 U/L   Total Bilirubin 0.5 0.3 - 1.2 mg/dL   GFR, Estimated >60 >60 mL/min    Comment: (NOTE) Calculated using the CKD-EPI Creatinine Equation (2021)    Anion gap 11 5 - 15    Comment: Performed at [facility] [address], [place], [place] [address]  CBC     Status: Abnormal   Collection Time: 12/24/20  8:24 AM  Result Value Ref Range   WBC 12.0 (H) 4.0 - 10.5 K/uL   RBC 5.64 4.22 - 5.81 MIL/uL   Hemoglobin 17.4 (H) 13.0 - 17.0 g/dL   HCT 50.9 39.0 - 52.0 %   MCV 90.2 80.0 - 100.0 fL   MCH 30.9 26.0 - 34.0 pg   MCHC 34.2 30.0 - 36.0 g/dL   RDW 12.8 11.5 - 15.5 %   Platelets 167 150 - 400 K/uL   nRBC 0.0 0.0 - 0.2 %    Comment: Performed at [facility], [place] [address], [place], [place] [address]  Urinalysis, Routine w reflex microscopic     Status: Abnormal   Collection Time: 12/24/20 10:08 AM  Result Value Ref Range   Color, Urine YELLOW YELLOW   APPearance HAZY (A) CLEAR   Specific Gravity, Urine 1.020 1.005 - 1.030   pH 7.0 5.0 - 8.0   Glucose, UA NEGATIVE NEGATIVE mg/dL   Hgb urine dipstick NEGATIVE NEGATIVE   Bilirubin Urine NEGATIVE NEGATIVE   Ketones, ur NEGATIVE NEGATIVE  mg/dL   Protein, ur NEGATIVE NEGATIVE mg/dL   Nitrite NEGATIVE NEGATIVE   Leukocytes,Ua TRACE (A) NEGATIVE   RBC / HPF 0-5 0 - 5 RBC/hpf   WBC, UA 0-5 0 - 5 WBC/hpf   Bacteria, UA NONE SEEN NONE SEEN   Squamous Epithelial / LPF 0-5 0 - 5   Mucus PRESENT    Ca Oxalate Crys, UA PRESENT     Comment: Performed at [facility] [address] [place], [place] [address]   CT ABDOMEN PELVIS W CONTRAST  Result Date: 12/24/2020 CLINICAL DATA:  Bowel obstruction suspected, diffuse abdominal pain, nausea, vomiting EXAM: CT ABDOMEN AND PELVIS WITH CONTRAST TECHNIQUE: Multidetector CT imaging of the abdomen and pelvis was performed using the standard protocol following bolus administration of intravenous contrast. CONTRAST:  143mL OMNIPAQUE IOHEXOL 300 MG/ML  SOLN COMPARISON:  12/20/2019 FINDINGS: Lower chest: No acute abnormality. Hepatobiliary: No focal liver abnormality is seen. Status post cholecystectomy. No biliary dilatation. Pancreas: Unremarkable. No pancreatic ductal dilatation or surrounding inflammatory changes. Spleen: Normal in size without significant abnormality. Adrenals/Urinary Tract: Adrenal glands are unremarkable. Kidneys are normal, without renal calculi, solid lesion, or hydronephrosis. Bladder is unremarkable. Stomach/Bowel: Surgical clips about the gastroesophageal junction. The mid small bowel is mildly distended, largest loops measuring 4.0 cm. Suspected transition point in the ventral right hemiabdomen, difficult to clearly identify due to multiple closely adjacent loops of bowel in this vicinity (series 3, image 36), with multiple loops of decompressed distal ileum. There is scattered gas and stool present throughout the colon to the rectum. The overall appearance and configuration of the bowel is similar to prior examination dated 12/20/2019. Sigmoid diverticulosis. Vascular/Lymphatic: Aortic atherosclerosis. No enlarged abdominal or pelvic lymph nodes. Reproductive:  Prostatomegaly. Other: Abdominal diastasis with evidence of prior ventral hernia repair. No abdominopelvic ascites. Musculoskeletal: No acute or  significant osseous findings. IMPRESSION: 1. The mid small bowel is mildly distended, largest loops measuring 4.0 cm. Suspected transition point in the ventral right hemiabdomen, difficult to clearly identify due to multiple closely adjacent loops of bowel in this vicinity, with multiple loops of decompressed distal ileum. There is scattered gas and stool present throughout the colon to the rectum. The overall appearance and configuration of the bowel is similar to prior examination dated 12/20/2019. Findings suggest partial or developing small bowel obstruction. 2. Abdominal diastasis with evidence of prior ventral hernia repair. 3. Prostatomegaly. Aortic Atherosclerosis (ICD10-I70.0). Electronically Signed   By: Eddie Candle M.D.   On: 12/24/2020 10:49    Assessment/Plan HTN - PRN IV hydralazine  Trigeminal neuralgia - on 400 mg gabapentin QID. Will hold due to NPO, NG tube and plan to resume once tolerating PO's.  SBO, recurrent - PMH nissen fundoplication complicated by esophageal perforation and multiple subsequent surgeries due to complications.  - he is hemodynamically stable, WBC 12, not peritonitic on exam - place NG tube, Small bowel protocol  - no emergent surgical needs. Hopefully this SBO will resolve with non-operative management. Given history of multiple operations and hernia repairs with mesh, surgery would carry an increased risk of bowel injury/resection given suspected dense adhesive disease within the abdomen. Given his history, even accessing the peritoneal cavity may be difficult due to adhesions.     Jill Alexanders, PA-C [facility] Please see Amion for pager number during day hours 7:00am-4:30pm 12/24/2020, 11:34 AM

## 2020-12-24 NOTE — ED Provider Notes (Signed)
[facility] EMERGENCY DEPARTMENT Provider Note   CSN: [id number] Arrival date & time: 12/24/20  0815     History Chief Complaint  Patient presents with  . Abdominal Pain    [name] is a 70 y.o. male.  HPI      70 year old male with remote history of Nissen complication complicated by gastric perforation, peritonitis, ventral hernia with multiple surgeries, admission for small bowel obstruction and COVID-19 in February 2021, hyperlipidemia, parathyroidism, hypertension, nephrolithiasis, trigeminal neuralgia presents with concern for abdominal pain and nausea.  Reports he woke up at 3 AM with severe abdominal pain.  Reports it is located throughout his abdomen, with radiation towards his bilateral flank.  Reports that the feeling of bloating and pressure.  Pain is currently a 7 out of 10.  Has associated nausea, and has had not attempted much p.o. intake since it began.  Had a normal bowel movement yesterday, but has not had a bowel movement this morning and denies flatus.  Has not had vomiting yet at this time.  Reports his symptoms do feel similar to his prior small bowel obstruction.  Denies fevers, urinary symptoms, cough, shortness of breath or other concerns  Past Medical History:  Diagnosis Date  . Allergic rhinitis   . Anemia, pernicious   . Anxiety and depression    sees Dr. [name]  . Arthritis   . B12 deficiency 01/23/2020  . Catheter-associated urinary tract infection ([place])   . Diverticulosis of colon   . Elevated PSA    and hypogonadism, sees urologist  . GERD (gastroesophageal reflux disease)    hiatal hernia  . Hx of blood transfusion reaction 1999  . Hyperlipidemia   . Hyperparathyroidism ([place])   . Hyperplastic colon polyp   . Hypertension    acei causes cough  . IBS (irritable bowel syndrome)   . Nephrolithiasis   . Obstructive chronic bronchitis without exacerbation, followed by Dr. [name] 03/04/2009   ONO RA  Normal 07/28/11 6  min walk >57m no desat PFTs 08/08/11: DLCO 70% TLC 80%  FeV1 80%   Fef 25 75 56% with significant improvement after BD   . Peritonitis ([place])    after surgery in 1999  . Pneumonia   . PONV (postoperative nausea and vomiting)   . Rectal fissure   . Trigeminal neuralgia    has facial asymetry  . Ventral hernia     Patient Active Problem List   Diagnosis Date Noted  . Diuretic-induced hypokalemia 04/16/2020  . Lumbar radiculopathy, right 02/03/2020  . Right leg pain 01/25/2020  . B12 deficiency 01/23/2020  . HLD (hyperlipidemia) 01/23/2020  . Urinary retention 01/11/2020  . COVID-19 virus infection 12/24/2019  . SBO (small bowel obstruction) ([place]) 12/20/2019  . Multifocal pneumonia 09/12/2017  . Enteritis 09/12/2017  . Hyponatremia 09/12/2017  . CAP (community acquired pneumonia) 09/12/2017  . Acute sinusitis 07/06/2017  . Hyperglycemia 01/18/2017  . Preventative health care 01/13/2016  . Absolute anemia 10/15/2015  . Cervical spondylosis, treated by Dr. Gladstone Lighter, [facility] 06/12/2013  . Ventral hernia 03/04/2013  . Anxiety and depression, followed by Dr. [name] in Psych 11/19/2012  . Hiatal hernia, followed by Dr. Hassell Done ([place]) and Dr. Olevia Perches (GI) 11/19/2012  . Other testicular hypofunction, followed by Dr. Karsten Ro in Urology 10/15/2012  . Trigeminal neuralgia, s/p NSU, followed by Dr. Vertell Limber 10/15/2012  . BPH (benign prostatic hyperplasia), followed by Dr. Karsten Ro 07/26/2011  . Rosacea 09/02/2010  . COLONIC POLYPS, HYPERPLASTIC, HX OF 12/13/2009  .  Obstructive chronic bronchitis without exacerbation, followed by Dr. [name] 03/04/2009  . Allergic rhinitis 04/03/2008  . Essential hypertension 05/08/2007  . GERD followed by Dr. Olevia Perches 05/08/2007    Past Surgical History:  Procedure Laterality Date  . ABDOMINAL SURGERY     Multiple  . BRAIN SURGERY  01/2011   Trigeminal nerve/Dr Vertell Limber  . CHOLECYSTECTOMY  1981  . DENTAL SURGERY  2019   tooth extraction, root canal  .  ESOPHAGEAL MANOMETRY N/A 04/28/2020   Procedure: ESOPHAGEAL MANOMETRY (EM);  Surgeon: Mauri Pole, MD;  Location: [facility];  Service: Endoscopy;  Laterality: N/A;  . HAND SURGERY  1973   Left  . HERNIA REPAIR  1999  . KNEE SURGERY  1997   Left  . LITHOTRIPSY     Right  . NISSEN FUNDOPLICATION  0737    complicated by gastric perforation, peritonitis, ventral nernia   . RHIZOTOMY Right 11/17/2014   Procedure: RHIZOTOMY TO RIGHT V2,V3;  Surgeon: Erline Levine, MD;  Location: [place] NEURO ORS;  Service: Neurosurgery;  Laterality: Right;  right  . RHIZOTOMY Right 12/22/2014   Procedure: Right V2 and V3 trigeminal rhysolysis;  Surgeon: Erline Levine, MD;  Location: [place] NEURO ORS;  Service: Neurosurgery;  Laterality: Right;  Right V2 and V3 trigeminal rhysolysis  . RHIZOTOMY W/ RADIOFREQUENCY ABLATION  08/2017   Dr [name]  . UPPER GASTROINTESTINAL ENDOSCOPY    . VENTRAL HERNIA REPAIR  2004       Family History  Problem Relation Age of Onset  . Diabetes Mother   . Coronary artery disease Mother        CABG  . Diabetes Sister   . Diabetes Brother   . Hypertension Brother   . Heart disease Brother   . Heart disease Brother   . Colon cancer Neg Hx   . Esophageal cancer Neg Hx   . Stomach cancer Neg Hx     Social History   Tobacco Use  . Smoking status: Former Smoker    Packs/day: 1.00    Years: 20.00    Pack years: 20.00    Types: Cigarettes    Quit date: 11/06/1996    Years since quitting: 24.1  . Smokeless tobacco: Former Systems developer    Types: [place] date: 11/07/1983  Vaping Use  . Vaping Use: Never used  Substance Use Topics  . Alcohol use: No  . Drug use: No    Home Medications Prior to Admission medications   Medication Sig Start Date End Date Taking? Authorizing Provider  acetaminophen (TYLENOL) 325 MG tablet Take 2 tablets (650 mg total) by mouth every 6 (six) hours as needed for mild pain, moderate pain or fever. 12/26/19   Norm Parcel, PA-C  ALPRAZolam  Duanne Moron) 1 MG tablet Take 1 mg by mouth at bedtime as needed for anxiety.     [provider]  carbamazepine (TEGRETOL XR) 100 MG 12 hr tablet Take 100 mg by mouth 3 (three) times daily.    [provider]  cholecalciferol (VITAMIN D3) 25 MCG (1000 UNIT) tablet Take 1,000 Units by mouth daily.    [provider]  cyanocobalamin (,VITAMIN B-12,) 1000 MCG/ML injection 1 cc IM every 15 days 08/04/20   [name], MD  dexlansoprazole (DEXILANT) 60 MG capsule Take 1 capsule (60 mg total) by mouth daily. 01/21/20   Mauri Pole, MD  esomeprazole (NEXIUM) 40 MG capsule Take 40 mg by mouth daily at 12 noon.  [provider]  gabapentin (NEURONTIN) 400 MG capsule Take 400 mg by mouth 4 (four) times daily.     [provider]  irbesartan (AVAPRO) 300 MG tablet Take by mouth. 04/15/20   [provider]  levocetirizine (XYZAL) 5 MG tablet Take by mouth. 04/15/20   [provider]  losartan-hydrochlorothiazide (HYZAAR) 50-12.5 MG tablet Take 1 tablet by mouth daily. 07/14/20   [name], MD  pantoprazole (PROTONIX) 40 MG tablet Take by mouth.    [provider]  polyethylene glycol powder (GLYCOLAX/MIRALAX) powder DISSOLVE 1 CAPFUL IN LIQUID EVERY DAY AS NEEDED FOR CONSTIPATION 02/18/14   Lucretia Kern, DO  potassium chloride SA (KLOR-CON) 20 MEQ tablet Take 1 tablet (20 mEq total) by mouth 2 (two) times daily. 04/16/20   Janith Lima, MD  Probiotic Product (ALIGN) 4 MG CAPS Take by mouth daily.    [provider]  sucralfate (CARAFATE) 1 GM/10ML suspension Take 10 mLs (1 g total) by mouth 4 (four) times daily -  with meals and at bedtime. 12/27/19   Norm Parcel, PA-C  tamsulosin (FLOMAX) 0.4 MG CAPS capsule Take 0.4 mg by mouth daily. 06/21/20   [provider]  triamcinolone (NASACORT) 55 MCG/ACT AERO nasal inhaler Place 2 sprays into the nose daily. 05/06/20   [name], MD  Zinc 220 (50 Zn) MG CAPS Take 1  capsule by mouth daily.    [provider]    Allergies    Dilantin [phenytoin], Dilaudid [hydromorphone hcl], Sulfa antibiotics, Ace inhibitors, Augmentin [amoxicillin-pot clavulanate], and Codeine  Review of Systems   Review of Systems  Constitutional: Negative for fever.  Eyes: Negative for visual disturbance.  Respiratory: Negative for cough and shortness of breath.   Cardiovascular: Negative for chest pain.  Gastrointestinal: Positive for abdominal pain and nausea. Negative for constipation, diarrhea and vomiting.  Genitourinary: Negative for difficulty urinating and dysuria.  Musculoskeletal: Negative for back pain and neck stiffness.  Skin: Negative for rash.  Neurological: Negative for syncope and headaches.    Physical Exam Updated Vital Signs BP 133/86   Pulse 76   Temp 98.3 F (36.8 C) (Oral)   Resp 14   SpO2 96%   Physical Exam Vitals and nursing note reviewed.  Constitutional:      General: He is not in acute distress.    Appearance: Normal appearance. He is not ill-appearing, toxic-appearing or diaphoretic.  HENT:     Head: Normocephalic.  Eyes:     Conjunctiva/sclera: Conjunctivae normal.  Cardiovascular:     Rate and Rhythm: Normal rate and regular rhythm.     Pulses: Normal pulses.  Pulmonary:     Effort: Pulmonary effort is normal. No respiratory distress.  Abdominal:     General: A surgical scar is present. There is distension.     Tenderness: There is generalized abdominal tenderness.  Musculoskeletal:        General: No deformity or signs of injury.     Cervical back: No rigidity.  Skin:    General: Skin is warm and dry.     Coloration: Skin is not jaundiced or pale.  Neurological:     General: No focal deficit present.     Mental Status: He is alert and oriented to person, place, and time.     ED Results / Procedures / Treatments   Labs (all labs ordered are listed, but only abnormal results are displayed) Labs Reviewed   COMPREHENSIVE METABOLIC PANEL - Abnormal; Notable  for the following components:      Result Value   Chloride 96 (*)    Glucose, Bld 101 (*)    All other components within normal limits  CBC - Abnormal; Notable for the following components:   WBC 12.0 (*)    Hemoglobin 17.4 (*)    All other components within normal limits  URINALYSIS, ROUTINE W REFLEX MICROSCOPIC - Abnormal; Notable for the following components:   APPearance HAZY (*)    Leukocytes,Ua TRACE (*)    All other components within normal limits  SARS CORONAVIRUS 2 (TAT 6-24 HRS)  LIPASE, BLOOD    EKG None  Radiology CT ABDOMEN PELVIS W CONTRAST  Result Date: 12/24/2020 CLINICAL DATA:  Bowel obstruction suspected, diffuse abdominal pain, nausea, vomiting EXAM: CT ABDOMEN AND PELVIS WITH CONTRAST TECHNIQUE: Multidetector CT imaging of the abdomen and pelvis was performed using the standard protocol following bolus administration of intravenous contrast. CONTRAST:  136mL OMNIPAQUE IOHEXOL 300 MG/ML  SOLN COMPARISON:  12/20/2019 FINDINGS: Lower chest: No acute abnormality. Hepatobiliary: No focal liver abnormality is seen. Status post cholecystectomy. No biliary dilatation. Pancreas: Unremarkable. No pancreatic ductal dilatation or surrounding inflammatory changes. Spleen: Normal in size without significant abnormality. Adrenals/Urinary Tract: Adrenal glands are unremarkable. Kidneys are normal, without renal calculi, solid lesion, or hydronephrosis. Bladder is unremarkable. Stomach/Bowel: Surgical clips about the gastroesophageal junction. The mid small bowel is mildly distended, largest loops measuring 4.0 cm. Suspected transition point in the ventral right hemiabdomen, difficult to clearly identify due to multiple closely adjacent loops of bowel in this vicinity (series 3, image 36), with multiple loops of decompressed distal ileum. There is scattered gas and stool present throughout the colon to the rectum. The overall appearance  and configuration of the bowel is similar to prior examination dated 12/20/2019. Sigmoid diverticulosis. Vascular/Lymphatic: Aortic atherosclerosis. No enlarged abdominal or pelvic lymph nodes. Reproductive: Prostatomegaly. Other: Abdominal diastasis with evidence of prior ventral hernia repair. No abdominopelvic ascites. Musculoskeletal: No acute or significant osseous findings. IMPRESSION: 1. The mid small bowel is mildly distended, largest loops measuring 4.0 cm. Suspected transition point in the ventral right hemiabdomen, difficult to clearly identify due to multiple closely adjacent loops of bowel in this vicinity, with multiple loops of decompressed distal ileum. There is scattered gas and stool present throughout the colon to the rectum. The overall appearance and configuration of the bowel is similar to prior examination dated 12/20/2019. Findings suggest partial or developing small bowel obstruction. 2. Abdominal diastasis with evidence of prior ventral hernia repair. 3. Prostatomegaly. Aortic Atherosclerosis (ICD10-I70.0). Electronically Signed   By: Eddie Candle M.D.   On: 12/24/2020 10:49    Procedures Procedures   Medications Ordered in ED Medications  sodium chloride 0.9 % bolus 1,000 mL (0 mLs Intravenous Stopped 12/24/20 1056)  fentaNYL (SUBLIMAZE) injection 100 mcg (100 mcg Intravenous Given 12/24/20 0846)  ondansetron (ZOFRAN) injection 4 mg (4 mg Intravenous Given 12/24/20 0846)  iohexol (OMNIPAQUE) 300 MG/ML solution 100 mL (100 mLs Intravenous Contrast Given 12/24/20 1035)  fentaNYL (SUBLIMAZE) injection 150 mcg (150 mcg Intravenous Given 12/24/20 1048)    ED Course  I have reviewed the triage vital signs and the nursing notes.  Pertinent labs & imaging results that were available during my care of the patient were reviewed by me and considered in my medical decision making (see chart for details).    MDM Rules/Calculators/A&P  70 year old male with  remote history of Nissen complication complicated by gastric perforation, peritonitis, ventral hernia with multiple surgeries, cholecystectomy, admission for small bowel obstruction and COVID-19 in February 2021, hyperlipidemia, parathyroidism, hypertension, nephrolithiasis, trigeminal neuralgia presents with concern for abdominal pain and nausea.  DDx includes pancreatitis, hepatitis, mesenteric ischemia, SBO, diverticulitis.  History concerning for SBO.  Given nausea and pain medication, IV fluids.  CT abdomen pelvis shows partial small bowel obstruction.  NG ordered and spoke to Melina Modena PA-C of General Surgery. Will admit for further care.      Final Clinical Impression(s) / ED Diagnoses Final diagnoses:  Generalized abdominal pain  Small bowel obstruction [facility])    Rx / DC Orders ED Discharge Orders    None       Gareth Morgan, MD 12/24/20 1132

## 2020-12-25 ENCOUNTER — Inpatient Hospital Stay (HOSPITAL_COMMUNITY): Payer: Medicare Other

## 2020-12-25 LAB — BASIC METABOLIC PANEL
Anion gap: 9 (ref 5–15)
BUN: 10 mg/dL (ref 8–23)
CO2: 28 mmol/L (ref 22–32)
Calcium: 8.6 mg/dL — ABNORMAL LOW (ref 8.9–10.3)
Chloride: 97 mmol/L — ABNORMAL LOW (ref 98–111)
Creatinine, Ser: 0.99 mg/dL (ref 0.61–1.24)
GFR, Estimated: >60 mL/min (ref >60)
Glucose, Bld: 114 mg/dL — ABNORMAL HIGH (ref 70–99)
Potassium: 4.2 mmol/L (ref 3.5–5.1)
Sodium: 134 mmol/L — ABNORMAL LOW (ref 135–145)

## 2020-12-25 LAB — CBC
HCT: 47.2 % (ref 39.0–52.0)
Hemoglobin: 16.3 g/dL (ref 13.0–17.0)
MCH: 30.8 pg (ref 26.0–34.0)
MCHC: 34.5 g/dL (ref 30.0–36.0)
MCV: 89.2 fL (ref 80.0–100.0)
Platelets: 157 10*3/uL (ref 150–400)
RBC: 5.29 MIL/uL (ref 4.22–5.81)
RDW: 12.9 % (ref 11.5–15.5)
WBC: 10 10*3/uL (ref 4.0–10.5)
nRBC: 0 % (ref 0.0–0.2)

## 2020-12-25 MED ORDER — CARBAMAZEPINE 200 MG PO TABS
200.0000 mg | ORAL_TABLET | Freq: Once | ORAL | Status: AC
  Administered 2020-12-25: 200 mg via ORAL
  Filled 2020-12-25: qty 1

## 2020-12-25 MED ORDER — ALPRAZOLAM 0.5 MG PO TABS
0.5000 mg | ORAL_TABLET | Freq: Every day | ORAL | Status: DC
  Administered 2020-12-25 – 2020-12-26 (×2): 0.5 mg via ORAL
  Filled 2020-12-25 (×2): qty 1

## 2020-12-25 NOTE — Progress Notes (Signed)
Progress Note: General Surgery Service   Chief Complaint/Subjective: +BM today, no abdominal pain  Objective: Vital signs in last 24 hours: Temp:  [97.8 F (36.6 C)-98.2 F (36.8 C)] 98 F (36.7 C) (02/19 0327) Pulse Rate:  [78-92] 79 (02/19 0327) Resp:  [15-17] 17 (02/19 0327) BP: (131-160)/(78-97) 139/78 (02/19 0327) SpO2:  [97 %-99 %] 98 % (02/19 0327) Last BM Date: 12/23/20  Intake/Output from previous day: 02/18 0701 - 02/19 0700 In: 157.6 [I.V.:157.6] Out: -  Intake/Output this shift: Total I/O In: -  Out: 2 [Stool:2]  Gen: NAD  Resp: nonlabored  Card: RRR  Abd: soft, NT, ND  Lab Results: CBC  Recent Labs    12/24/20 0824 12/25/20 0355  WBC 12.0* 10.0  HGB 17.4* 16.3  HCT 50.9 47.2  PLT 167 157   BMET Recent Labs    12/24/20 0824 12/25/20 0355  NA 135 134*  K 4.0 4.2  CL 96* 97*  CO2 28 28  GLUCOSE 101* 114*  BUN 12 10  CREATININE 0.89 0.99  CALCIUM 9.4 8.6*   PT/INR No results for input(s): LABPROT, INR in the last 72 hours. ABG No results for input(s): PHART, HCO3 in the last 72 hours.  Invalid input(s): PCO2, PO2  Anti-infectives: Anti-infectives (From admission, onward)   None      Medications: Scheduled Meds: . enoxaparin (LOVENOX) injection  40 mg Subcutaneous Q24H  . pantoprazole (PROTONIX) IV  40 mg Intravenous QHS   Continuous Infusions: . dextrose 5 % and 0.45 % NaCl with KCl 10 mEq/L 75 mL/hr at 12/25/20 0433  . methocarbamol (ROBAXIN) IV     PRN Meds:.diphenhydrAMINE **OR** diphenhydrAMINE, hydrALAZINE, methocarbamol (ROBAXIN) IV, morphine injection, ondansetron **OR** ondansetron (ZOFRAN) IV, promethazine  Assessment/Plan: s/p    70 yo male with adhesive SBO on protocol -clamp NG tube and clear liquids -XR today to follow up gastrograffin administration -ambulate   LOS: 1 day   Mickeal Skinner, MD [facility]

## 2020-12-25 NOTE — Plan of Care (Signed)
  Problem: Education: Goal: Knowledge of General Education information will improve Description: Including pain rating scale, medication(s)/side effects and non-pharmacologic comfort measures Outcome: Progressing   Problem: Clinical Measurements: Goal: Ability to maintain clinical measurements within normal limits will improve Outcome: Progressing Goal: Will remain free from infection Outcome: Progressing Goal: Diagnostic test results will improve Outcome: Progressing Goal: Respiratory complications will improve Outcome: Progressing Goal: Cardiovascular complication will be avoided Outcome: Progressing   Problem: Activity: Goal: Risk for activity intolerance will decrease Outcome: Progressing   Problem: Nutrition: Goal: Adequate nutrition will be maintained Outcome: Progressing   Problem: Coping: Goal: Level of anxiety will decrease Outcome: Progressing   Problem: Elimination: Goal: Will not experience complications related to bowel motility Outcome: Progressing Goal: Will not experience complications related to urinary retention Outcome: Progressing   Problem: Safety: Goal: Ability to remain free from injury will improve Outcome: Progressing   Problem: Skin Integrity: Goal: Risk for impaired skin integrity will decrease Outcome: Progressing   

## 2020-12-26 MED ORDER — LIP MEDEX EX OINT
1.0000 "application " | TOPICAL_OINTMENT | Freq: Two times a day (BID) | CUTANEOUS | Status: DC
  Filled 2020-12-26: qty 7

## 2020-12-26 MED ORDER — TRIAMCINOLONE ACETONIDE 55 MCG/ACT NA AERO
2.0000 | INHALATION_SPRAY | Freq: Every day | NASAL | Status: DC
  Filled 2020-12-26 (×2): qty 21.6

## 2020-12-26 MED ORDER — MENTHOL 3 MG MT LOZG: 1.0000 | LOZENGE | OROMUCOSAL | Status: DC | PRN

## 2020-12-26 MED ORDER — ALPRAZOLAM 0.5 MG PO TABS: 1.0000 mg | ORAL_TABLET | Freq: Every evening | ORAL | Status: DC | PRN

## 2020-12-26 MED ORDER — LOSARTAN POTASSIUM 50 MG PO TABS
50.0000 mg | ORAL_TABLET | Freq: Every day | ORAL | Status: DC
  Administered 2020-12-26 – 2020-12-27 (×2): 50 mg via ORAL
  Filled 2020-12-26 (×2): qty 1

## 2020-12-26 MED ORDER — SODIUM CHLORIDE 0.9 % IV SOLN
8.0000 mg | Freq: Four times a day (QID) | INTRAVENOUS | Status: DC | PRN
  Filled 2020-12-26: qty 4

## 2020-12-26 MED ORDER — LORAZEPAM 2 MG/ML IJ SOLN: 0.5000 mg | Freq: Three times a day (TID) | INTRAMUSCULAR | Status: DC | PRN

## 2020-12-26 MED ORDER — ONDANSETRON HCL 4 MG/2ML IJ SOLN
4.0000 mg | Freq: Four times a day (QID) | INTRAMUSCULAR | Status: DC | PRN
  Administered 2020-12-26: 4 mg via INTRAVENOUS
  Filled 2020-12-26: qty 2

## 2020-12-26 MED ORDER — METOPROLOL TARTRATE 5 MG/5ML IV SOLN
5.0000 mg | Freq: Four times a day (QID) | INTRAVENOUS | Status: DC | PRN
  Administered 2020-12-27: 5 mg via INTRAVENOUS
  Filled 2020-12-26: qty 5

## 2020-12-26 MED ORDER — SUCRALFATE 1 GM/10ML PO SUSP
1.0000 g | Freq: Two times a day (BID) | ORAL | Status: DC
  Administered 2020-12-26 – 2020-12-27 (×3): 1 g via ORAL
  Filled 2020-12-26 (×4): qty 10

## 2020-12-26 MED ORDER — LACTATED RINGERS IV BOLUS: 1000.0000 mL | Freq: Three times a day (TID) | INTRAVENOUS | Status: DC | PRN

## 2020-12-26 MED ORDER — CARBAMAZEPINE ER 100 MG PO TB12
100.0000 mg | ORAL_TABLET | Freq: Three times a day (TID) | ORAL | Status: DC
  Administered 2020-12-26 – 2020-12-27 (×2): 100 mg via ORAL
  Filled 2020-12-26 (×5): qty 1

## 2020-12-26 MED ORDER — TAMSULOSIN HCL 0.4 MG PO CAPS
0.4000 mg | ORAL_CAPSULE | Freq: Every day | ORAL | Status: DC
  Administered 2020-12-26 – 2020-12-27 (×2): 0.4 mg via ORAL
  Filled 2020-12-26 (×2): qty 1

## 2020-12-26 MED ORDER — HYDROCHLOROTHIAZIDE 12.5 MG PO CAPS
12.5000 mg | ORAL_CAPSULE | Freq: Every day | ORAL | Status: DC
  Administered 2020-12-26 – 2020-12-27 (×2): 12.5 mg via ORAL
  Filled 2020-12-26 (×2): qty 1

## 2020-12-26 MED ORDER — LOSARTAN POTASSIUM-HCTZ 50-12.5 MG PO TABS: 1.0000 | ORAL_TABLET | Freq: Every day | ORAL | Status: DC

## 2020-12-26 MED ORDER — SIMETHICONE 40 MG/0.6ML PO SUSP
40.0000 mg | Freq: Four times a day (QID) | ORAL | Status: DC | PRN
  Filled 2020-12-26: qty 0.6

## 2020-12-26 MED ORDER — ZINC SULFATE 220 (50 ZN) MG PO CAPS
220.0000 mg | ORAL_CAPSULE | Freq: Every day | ORAL | Status: DC
  Administered 2020-12-26: 220 mg via ORAL
  Filled 2020-12-26 (×2): qty 1

## 2020-12-26 MED ORDER — ALUM & MAG HYDROXIDE-SIMETH 200-200-20 MG/5ML PO SUSP: 30.0000 mL | Freq: Four times a day (QID) | ORAL | Status: DC | PRN

## 2020-12-26 MED ORDER — BISACODYL 10 MG RE SUPP: 10.0000 mg | Freq: Two times a day (BID) | RECTAL | Status: DC | PRN

## 2020-12-26 MED ORDER — POTASSIUM CHLORIDE CRYS ER 20 MEQ PO TBCR
20.0000 meq | EXTENDED_RELEASE_TABLET | Freq: Two times a day (BID) | ORAL | Status: DC
  Administered 2020-12-26: 20 meq via ORAL
  Filled 2020-12-26 (×3): qty 1

## 2020-12-26 MED ORDER — PANTOPRAZOLE SODIUM 40 MG PO TBEC
40.0000 mg | DELAYED_RELEASE_TABLET | Freq: Two times a day (BID) | ORAL | Status: DC
  Administered 2020-12-26 – 2020-12-27 (×2): 40 mg via ORAL
  Filled 2020-12-26 (×2): qty 1

## 2020-12-26 MED ORDER — RISAQUAD PO CAPS
1.0000 | ORAL_CAPSULE | Freq: Every day | ORAL | Status: DC
  Administered 2020-12-26: 1 via ORAL
  Filled 2020-12-26 (×2): qty 1

## 2020-12-26 MED ORDER — PROCHLORPERAZINE EDISYLATE 10 MG/2ML IJ SOLN: 5.0000 mg | INTRAMUSCULAR | Status: DC | PRN

## 2020-12-26 MED ORDER — ALBUTEROL SULFATE (2.5 MG/3ML) 0.083% IN NEBU: 2.5000 mg | INHALATION_SOLUTION | Freq: Four times a day (QID) | RESPIRATORY_TRACT | Status: DC | PRN

## 2020-12-26 MED ORDER — MAGIC MOUTHWASH
15.0000 mL | Freq: Four times a day (QID) | ORAL | Status: DC | PRN
  Filled 2020-12-26: qty 15

## 2020-12-26 MED ORDER — PHENOL 1.4 % MT LIQD: 2.0000 | OROMUCOSAL | Status: DC | PRN

## 2020-12-26 NOTE — Progress Notes (Signed)
CABOT CROMARTIE [id number] 1951-05-24  CARE TEAM:  PCP: [name], MD  Outpatient Care Team: Patient Care Team: [name], MD as PCP - General (Internal Medicine) Josue Hector, MD as PCP - Cardiology (Cardiology) Kathie Rhodes, MD (Inactive) (Urology) Lafayette Dragon, MD (Inactive) (Gastroenterology) Chucky May, MD (Psychiatry) Steffanie Rainwater, DPM (Podiatry) Lavonna Monarch, MD (Dermatology) Johnathan Hausen, MD (General Surgery)  Inpatient Treatment Team: Treatment Team: Attending Provider: Edison Pace, Md, MD; Consulting Physician: Edison Pace, Md, MD; Registered Nurse: Rodell Perna, RN; Utilization Review: Alease Medina, RN; [place]win Management: Lindell Noe, RN; Social Worker: Jeannette How   Problem List:   Principal Problem:   SBO (small bowel obstruction) [facility]) Active Problems:   Essential hypertension   Allergic rhinitis   GERD followed by Dr. Olevia Perches   Obstructive chronic bronchitis without exacerbation, followed by Dr. [name]   Anxiety and depression, followed by Dr. [name] in Psych   Hiatal hernia, followed by Dr. Hassell Done ([place]) and Dr. Olevia Perches (GI)      * No surgery found *      Assessment  SBO resolving  [facility] Stay = 2 days)  Plan:  -adv diet -resume home meds -VTE prophylaxis- SCDs, etc -mobilize as tolerated to help recovery  Disposition:  Disposition:  The patient is from: Home  Anticipate discharge to:  Home  Anticipated Date of Discharge is:  February 21,2022    Barriers to discharge:  Pending Clinical improvement (more likely than not)  Patient currently is NOT MEDICALLY STABLE for discharge from the hospital from a surgery standpoint.      20 minutes spent in review, evaluation, examination, counseling, and coordination of care.   I have reviewed this patient's available data, including medical history, events of note, physical examination and test results as part of my evaluation.  A  significant portion of that time was spent in counseling.  Care during the described time interval was provided by me.  12/26/2020    Subjective: (Chief complaint)  Tol NGT out tol clears Pt & wife anxious to resume home meds  Objective:  Vital signs:  Vitals:   12/25/20 1536 12/25/20 2100 12/26/20 0500 12/26/20 0900  BP: (!) 145/91 (!) 147/89 (!) 155/89 (!) 162/87  Pulse: 69 71 66 67  Resp: 18 17 19 17   Temp: 97.9 F (36.6 C) 98.6 F (37 C) 97.9 F (36.6 C) 97.9 F (36.6 C)  TempSrc: Oral Oral Oral   SpO2:  98% 100% 100%    Last BM Date: 12/23/20  Intake/Output   Yesterday:  02/19 0701 - 02/20 0700 In: -  Out: 252 [Urine:250; Stool:2] This shift:  No intake/output data recorded.  Bowel function:  Flatus: YES  BM:  No  Drain: (No drain)   Physical Exam:  General: Pt awake/alert in no acute distress Eyes: PERRL, normal EOM.  Sclera clear.  No icterus Neuro: CN II-XII intact w/o focal sensory/motor deficits. Lymph: No head/neck/groin lymphadenopathy Psych:  No delerium/psychosis/paranoia.  Oriented x 4 HENT: Normocephalic, Mucus membranes moist.  No thrush Neck: Supple, No tracheal deviation.  No obvious thyromegaly Chest: No pain to chest wall compression.  Good respiratory excursion.  No audible wheezing CV:  Pulses intact.  Regular rhythm.  No major extremity edema MS: Normal AROM mjr joints.  No obvious deformity  Abdomen: Soft.  Mildy distended.  Nontender.  No evidence of peritonitis.  No incarcerated hernias.  Ext:  No deformity.  No mjr edema.  No cyanosis Skin: No petechiae / purpurea.  No major sores.  Warm and dry    Results:   Cultures: Recent Results (from the past 720 hour(s))  SARS CORONAVIRUS 2 (TAT 6-24 HRS) Nasopharyngeal Nasopharyngeal Swab     Status: None   Collection Time: 12/24/20 11:24 AM   Specimen: Nasopharyngeal Swab  Result Value Ref Range Status   SARS Coronavirus 2 NEGATIVE NEGATIVE Final    Comment:  (NOTE) SARS-CoV-2 target nucleic acids are NOT DETECTED.  The SARS-CoV-2 RNA is generally detectable in upper and lower respiratory specimens during the acute phase of infection. Negative results do not preclude SARS-CoV-2 infection, do not rule out co-infections with other pathogens, and should not be used as the sole basis for treatment or other patient management decisions. Negative results must be combined with clinical observations, patient history, and epidemiological information. The expected result is Negative.  Fact Sheet for Patients: SugarRoll.be  Fact Sheet for Healthcare Providers: https://www.woods-mathews.com/  This test is not yet approved or cleared by the [place] FDA and  has been authorized for detection and/or diagnosis of SARS-CoV-2 by FDA under an Emergency Use Authorization (EUA). This EUA will remain  in effect (meaning this test can be used) for the duration of the COVID-19 declaration under Se ction 564(b)(1) of the Act, 21 U.S.C. section 360bbb-3(b)(1), unless the authorization is terminated or revoked sooner.  Performed at [facility], [place] [address] [place], [place] [address]     Labs: Results for orders placed or performed during the hospital encounter of 12/24/20 (from the past 48 hour(s))  HIV Antibody (routine testing w rflx)     Status: None   Collection Time: 12/24/20  1:59 PM  Result Value Ref Range   HIV Screen 4th Generation wRfx Non Reactive Non Reactive    Comment: Performed at [facility], [place] [address], [place], [place] [address]  Basic metabolic panel     Status: Abnormal   Collection Time: 12/25/20  3:55 AM  Result Value Ref Range   Sodium 134 (L) 135 - 145 mmol/L   Potassium 4.2 3.5 - 5.1 mmol/L   Chloride 97 (L) 98 - 111 mmol/L   CO2 28 22 - 32 mmol/L   Glucose, Bld 114 (H) 70 - 99 mg/dL    Comment: Glucose reference range applies only to samples taken after  fasting for at least 8 hours.   BUN 10 8 - 23 mg/dL   Creatinine, Ser 0.99 0.61 - 1.24 mg/dL   Calcium 8.6 (L) 8.9 - 10.3 mg/dL   GFR, Estimated >60 >60 mL/min    Comment: (NOTE) Calculated using the CKD-EPI Creatinine Equation (2021)    Anion gap 9 5 - 15    Comment: Performed at [facility] [address], [place], [place] [address]  CBC     Status: None   Collection Time: 12/25/20  3:55 AM  Result Value Ref Range   WBC 10.0 4.0 - 10.5 K/uL   RBC 5.29 4.22 - 5.81 MIL/uL   Hemoglobin 16.3 13.0 - 17.0 g/dL   HCT 47.2 39.0 - 52.0 %   MCV 89.2 80.0 - 100.0 fL   MCH 30.8 26.0 - 34.0 pg   MCHC 34.5 30.0 - 36.0 g/dL   RDW 12.9 11.5 - 15.5 %   Platelets 157 150 - 400 K/uL   nRBC 0.0 0.0 - 0.2 %    Comment: Performed at [facility], [place] [address]  [place], [place] [address]    Imaging / Studies: DG Abd Portable 1V-Small Bowel Obstruction Protocol-initial, 8 hr delay  Result Date: 12/25/2020 CLINICAL DATA:  Small-bowel obstruction, 8 hour delay EXAM: PORTABLE ABDOMEN - 1 VIEW COMPARISON:  Portable exam 1732 hours compared to 12/24/2020 abdominal radiographs and CT exam FINDINGS: Oral contrast has passed into the colon to the level of the rectum. Mild gaseous distension of small bowel loops is identified though decreased from previous study. Findings are consistent with a partial degree of small-bowel obstruction. No bowel wall thickening. Bones demineralized. IMPRESSION: Oral contrast has passed to the colon with decreased small bowel distension since previous exam, consistent with a partial degree of small-bowel obstruction. Electronically Signed   By: Lavonia Dana M.D.   On: 12/25/2020 17:45   DG Abd Portable 1V-Small Bowel Protocol-Position Verification  Result Date: 12/24/2020 CLINICAL DATA:  Nasogastric tube placement. EXAM: PORTABLE ABDOMEN - 1 VIEW COMPARISON:  Radiographs 12/23/2019 and 12/22/2019.  CT 12/24/2020. FINDINGS: 1304 hours. Nasogastric tube tip projects  over the gastric fundus, side hole near the GE junction. Multiple surgical clips are present in the upper abdomen, unchanged. The bowel gas pattern appears nonobstructive. There is mild atelectasis at both lung bases. Degenerative changes are present throughout the spine. IMPRESSION: Nasogastric tube tip projects over the gastric fundus. Electronically Signed   By: Richardean Sale M.D.   On: 12/24/2020 13:21    Medications / Allergies: per chart  Antibiotics: Anti-infectives (From admission, onward)   None        Note: Portions of this report may have been transcribed using voice recognition software. Every effort was made to ensure accuracy; however, inadvertent computerized transcription errors may be present.   Any transcriptional errors that result from this process are unintentional.    Adin Hector, MD, FACS, MASCRS Gastrointestinal and Minimally Invasive Surgery  [facility] [address] [place], [place] [address] [phone] Fax [phone] Main/Paging  CONTACT INFORMATION: Weekday (9AM-5PM) concerns: Call [facility] main office at [phone] Weeknight (5PM-9AM) or Weekend/Holiday concerns: Check [email or web address] for General Surgery [facility] coverage (Please, do not use SecureChat as it is not reliable communication to operating surgeons for immediate patient care)      12/26/2020  11:49 AM

## 2020-12-27 MED ORDER — GABAPENTIN 400 MG PO CAPS: 800.0000 mg | ORAL_CAPSULE | Freq: Every day | ORAL | Status: DC

## 2020-12-27 MED ORDER — GABAPENTIN 400 MG PO CAPS: 400.0000 mg | ORAL_CAPSULE | ORAL | Status: DC

## 2020-12-27 MED ORDER — ACETAMINOPHEN 500 MG PO TABS
1000.0000 mg | ORAL_TABLET | Freq: Four times a day (QID) | ORAL | Status: DC | PRN
  Administered 2020-12-27: 1000 mg via ORAL
  Filled 2020-12-27: qty 2

## 2020-12-27 MED ORDER — GABAPENTIN 400 MG PO CAPS
400.0000 mg | ORAL_CAPSULE | Freq: Two times a day (BID) | ORAL | Status: DC
  Administered 2020-12-27: 400 mg via ORAL
  Filled 2020-12-27: qty 1

## 2020-12-27 NOTE — Progress Notes (Signed)
Pt was given his AVS discharge summary and went over with him. IV was removed earlier today. Pt has no further questions.

## 2020-12-27 NOTE — Progress Notes (Signed)
Progress Note: General Surgery Service   Chief Complaint/Subjective: Tolerating liquids, +BM yesterday, some discomfort but improving  Objective: Vital signs in last 24 hours: Temp:  [97.7 F (36.5 C)-97.9 F (36.6 C)] 97.7 F (36.5 C) (02/21 0740) Pulse Rate:  [56-83] 60 (02/21 0740) Resp:  [17-19] 17 (02/21 0740) BP: (143-174)/(87-114) 143/92 (02/21 0740) SpO2:  [98 %-100 %] 98 % (02/21 0740) Last BM Date: 12/26/20  Intake/Output from previous day: No intake/output data recorded. Intake/Output this shift: No intake/output data recorded.  Gen: NAD  Resp: nonlabored  Card: RRR  Abd: soft, NT, ND  Lab Results: CBC  Recent Labs    12/25/20 0355  WBC 10.0  HGB 16.3  HCT 47.2  PLT 157   BMET Recent Labs    12/25/20 0355  NA 134*  K 4.2  CL 97*  CO2 28  GLUCOSE 114*  BUN 10  CREATININE 0.99  CALCIUM 8.6*   PT/INR No results for input(s): LABPROT, INR in the last 72 hours. ABG No results for input(s): PHART, HCO3 in the last 72 hours.  Invalid input(s): PCO2, PO2  Anti-infectives: Anti-infectives (From admission, onward)   None      Medications: Scheduled Meds: . acidophilus  1 capsule Oral Daily  . ALPRAZolam  0.5 mg Oral QHS  . carbamazepine  100 mg Oral TID  . enoxaparin (LOVENOX) injection  40 mg Subcutaneous Q24H  . losartan  50 mg Oral Daily   And  . hydrochlorothiazide  12.5 mg Oral Daily  . lip balm  1 application Topical BID  . pantoprazole  40 mg Oral BID AC  . potassium chloride SA  20 mEq Oral BID  . sucralfate  1 g Oral BID  . tamsulosin  0.4 mg Oral Daily  . triamcinolone  2 spray Nasal Daily  . zinc sulfate  220 mg Oral Daily   Continuous Infusions: . dextrose 5 % and 0.45 % NaCl with KCl 10 mEq/L 75 mL/hr at 12/27/20 0800  . lactated ringers    . methocarbamol (ROBAXIN) IV    . ondansetron (ZOFRAN) IV     PRN Meds:.acetaminophen, albuterol, ALPRAZolam, alum & mag hydroxide-simeth, bisacodyl, diphenhydrAMINE **OR**  diphenhydrAMINE, hydrALAZINE, lactated ringers, magic mouthwash, menthol-cetylpyridinium, methocarbamol (ROBAXIN) IV, metoprolol tartrate, morphine injection, ondansetron (ZOFRAN) IV **OR** ondansetron (ZOFRAN) IV, ondansetron **OR** [DISCONTINUED] ondansetron (ZOFRAN) IV, phenol, prochlorperazine, simethicone  Assessment/Plan: SBO resolving -advance to soft diet -possible discharge today   LOS: 3 days   Mickeal Skinner, MD [facility]

## 2020-12-27 NOTE — Plan of Care (Signed)

## 2021-01-04 NOTE — Discharge Summary (Addendum)
[facility] Discharge Summary   Patient ID: [name] MRN: [id number] DOB/AGE: January 14, 1951 70 y.o.  Admit date: 12/24/2020 Discharge date: 12/27/2020  Discharge Diagnosis Patient Active Problem List   Diagnosis Date Noted  . Diuretic-induced hypokalemia 04/16/2020  . Lumbar radiculopathy, right 02/03/2020  . Right leg pain 01/25/2020  . B12 deficiency 01/23/2020  . HLD (hyperlipidemia) 01/23/2020  . Urinary retention 01/11/2020  . COVID-19 virus infection 12/24/2019  . SBO (small bowel obstruction) ([place]) 12/20/2019  . Multifocal pneumonia 09/12/2017  . Enteritis 09/12/2017  . Hyponatremia 09/12/2017  . CAP (community acquired pneumonia) 09/12/2017  . Acute sinusitis 07/06/2017  . Hyperglycemia 01/18/2017  . Preventative health care 01/13/2016  . Absolute anemia 10/15/2015  . Cervical spondylosis, treated by Dr. Gladstone Lighter, [facility] 06/12/2013  . Ventral hernia 03/04/2013  . Anxiety and depression, followed by Dr. [name] in Psych 11/19/2012  . Hiatal hernia, followed by Dr. Hassell Done ([place]) and Dr. Olevia Perches ([place]) 11/19/2012  . Other testicular hypofunction, followed by Dr. Karsten Ro in Urology 10/15/2012  . Trigeminal neuralgia, s/p NSU, followed by Dr. Vertell Limber 10/15/2012  . BPH (benign prostatic hyperplasia), followed by Dr. Karsten Ro 07/26/2011  . Rosacea 09/02/2010  . COLONIC POLYPS, HYPERPLASTIC, HX OF 12/13/2009  . Obstructive chronic bronchitis without exacerbation, followed by Dr. [name] 03/04/2009  . Allergic rhinitis 04/03/2008  . Essential hypertension 05/08/2007  . GERD followed by Dr. Olevia Perches 05/08/2007    Consultants N/A   Procedures none  Hospital Course:  Mr. [name]  is a 70 year old male who has history of  hypertension, GERD, pSBO, with a past surgical history significant for Nissen fundoplication 3016, multiple post operative operations for complications, multiple subsequent ventral hernia repairs, and now with a slipped  Nissen. His cc is acute abdominal pain that woke him up from his sleep at 0300. Described as central, sharp abdominal pain with radiation to his flank bilaterally. Associated sxs incliude abdominal distention and nausea. Reports similar pain in the past when he was admitted with pSBO about one year ago (12/2019). He Denies emesis. Last BM was yesterday. Denies flatus or BM since yesterday. At baseline he takes miralax daily and has one semi-solid, non-bloody BM daily. He denies fever, chills, CP, SOB, urinary sxs, hematochezia, or melena. Prior to this episode of pain he was tolerating a regular diet - multiple small meals daily, avoiding heavy, greasy foods.  He is followed by Dr. Hassell Done in our [place] clinic who did not recommend re-attempt at nissen or ventral hernia repair. He has also seen CT surgery for consideration of redo Nissen (Dr. Kipp Brood) and was not offered an operation as a transthoracic approach would likely be unsuccessful due to adhesions in the upper abdomen. He is a former smoker who quit around 1998. Denies alcohol or drug use. He is retired and lives with his wife in [place], [place]. At baseline he mobilizes without an assistive device and exercises 30-45 minutes on the elliptical daily without chest pain.   ED workup included a CT scan consistent with recurrent SBO. A nasogastric tube was placed and the patient was admitted for further management. He underwent the small bowel protocol with gastrografin contrast via NG tube. Contrast progressed to the colon and the patients bowel obstruction clinically resolved. On 12/27/20 the patients vitals were stable, tolerating PO, having bowel function, and felt stable for discharge home. He knows to call with questions or concerns.    Allergies as of 12/27/2020      Reactions   Dilantin [phenytoin]  Other (See Comments)   bradycardia   Dilaudid [hydromorphone Hcl] Nausea And Vomiting   Sulfa Antibiotics Hives   Ace Inhibitors Cough   Augmentin  [amoxicillin-pot Clavulanate] Nausea And Vomiting   Codeine Nausea And Vomiting      Medication List    TAKE these medications   acetaminophen 325 MG tablet Commonly known as: TYLENOL Take 2 tablets (650 mg total) by mouth every 6 (six) hours as needed for mild pain, moderate pain or fever.   Align 4 MG Caps Take 4 mg by mouth daily.   ALPRAZolam 1 MG tablet Commonly known as: XANAX Take 1 mg by mouth at bedtime as needed for anxiety.   carbamazepine 100 MG 12 hr tablet Commonly known as: TEGRETOL XR Take 100 mg by mouth 3 (three) times daily.   cholecalciferol 25 MCG (1000 UNIT) tablet Commonly known as: VITAMIN D3 Take 1,000 Units by mouth daily.   cyanocobalamin 1000 MCG/ML injection Commonly known as: (VITAMIN B-12) 1 cc IM every 15 days What changed:   how much to take  how to take this  when to take this  additional instructions   Dexilant 60 MG capsule Generic drug: dexlansoprazole Take 1 capsule (60 mg total) by mouth daily. What changed:   how much to take  when to take this  reasons to take this   esomeprazole 40 MG capsule Commonly known as: NEXIUM Take 40 mg by mouth daily at 12 noon.   gabapentin 400 MG capsule Commonly known as: NEURONTIN Take 400-800 mg by mouth See admin instructions. Taking 400mg  at 0800, 1400, then 2 capsules (800mg ) at bedtime.   losartan-hydrochlorothiazide 50-12.5 MG tablet Commonly known as: HYZAAR Take 1 tablet by mouth daily.   polyethylene glycol powder 17 GM/SCOOP powder Commonly known as: GLYCOLAX/MIRALAX DISSOLVE 1 CAPFUL IN LIQUID EVERY DAY AS NEEDED FOR CONSTIPATION What changed: See the new instructions.   potassium chloride SA 20 MEQ tablet Commonly known as: KLOR-CON Take 1 tablet (20 mEq total) by mouth 2 (two) times daily.   sucralfate 1 GM/10ML suspension Commonly known as: CARAFATE Take 10 mLs (1 g total) by mouth 4 (four) times daily -  with meals and at bedtime. What changed: when to take  this   tamsulosin 0.4 MG Caps capsule Commonly known as: FLOMAX Take 0.4 mg by mouth in the morning and at bedtime.   triamcinolone 55 MCG/ACT Aero nasal inhaler Commonly known as: NASACORT Place 2 sprays into the nose daily. What changed:   when to take this  reasons to take this   Zinc 220 (50 Zn) MG Caps Take 220 mg by mouth daily.          Signed: Obie Dredge, [facility] 01/04/2021, 2:24 PM

## 2021-01-10 DIAGNOSIS — M25511 Pain in right shoulder: Secondary | ICD-10-CM | POA: Diagnosis not present

## 2021-01-17 DIAGNOSIS — S161XXA Strain of muscle, fascia and tendon at neck level, initial encounter: Secondary | ICD-10-CM | POA: Diagnosis not present

## 2021-01-17 DIAGNOSIS — G500 Trigeminal neuralgia: Secondary | ICD-10-CM | POA: Diagnosis not present

## 2021-01-17 DIAGNOSIS — M542 Cervicalgia: Secondary | ICD-10-CM | POA: Diagnosis not present

## 2021-01-25 NOTE — Progress Notes (Signed)
Cardiology Office Note   Date:  02/04/2021   ID:  [name], DOB [birth year], MRN [id number]  PCP:  [name], MD  Cardiologist:   Jenkins Rouge, MD   No chief complaint on file.     History of Present Illness: [name] is a 70 y.o. male who presents for f/U regarding chest pain. Referred by Dr [name] 12/2018   CRF;s include HTN and HLD. He has chronic GERD post Nissen fundoplication and recurrent hiatal hernia Taking Dexilant and Carafate He gets frequent burning pain in epigastric area and chest as well as burping and nausea. Definitely GI Overtones to pain Was hospitalized with another SBO 2/18-2/21/22 with decompression NG tube spontaneous resolution   Retired from [employer] has 3 kids. Brother had stents at his age No chest pain   Calcium score done 01/02/19 was 71 below average for age 59 th percentile with LDL running in the 87 range with diet Rx  Still with some cramping in stomach  Past Medical History:  Diagnosis Date  . Allergic rhinitis   . Anemia, pernicious   . Anxiety and depression    sees Dr. [name]  . Arthritis   . B12 deficiency 01/23/2020  . Catheter-associated urinary tract infection ([place])   . Diverticulosis of colon   . Elevated PSA    and hypogonadism, sees urologist  . GERD (gastroesophageal reflux disease)    hiatal hernia  . Hx of blood transfusion reaction 1999  . Hyperlipidemia   . Hyperparathyroidism ([place])   . Hyperplastic colon polyp   . Hypertension    acei causes cough  . IBS (irritable bowel syndrome)   . Nephrolithiasis   . Obstructive chronic bronchitis without exacerbation, followed by Dr. [name] 03/04/2009   ONO RA  Normal 07/28/11 6 min walk >534m no desat PFTs 08/08/11: DLCO 70% TLC 80%  FeV1 80%   Fef 25 75 56% with significant improvement after BD   . Peritonitis ([place])    after surgery in 1999  . Pneumonia   . PONV (postoperative nausea and vomiting)   . Rectal fissure   . Trigeminal neuralgia     has facial asymetry  . Ventral hernia     Past Surgical History:  Procedure Laterality Date  . ABDOMINAL SURGERY     Multiple  . BRAIN SURGERY  01/2011   Trigeminal nerve/Dr Vertell Limber  . CHOLECYSTECTOMY  1981  . DENTAL SURGERY  2019   tooth extraction, root canal  . ESOPHAGEAL MANOMETRY N/A 04/28/2020   Procedure: ESOPHAGEAL MANOMETRY (EM);  Surgeon: Mauri Pole, MD;  Location: [facility];  Service: Endoscopy;  Laterality: N/A;  . HAND SURGERY  1973   Left  . HERNIA REPAIR  1999  . KNEE SURGERY  1997   Left  . LITHOTRIPSY     Right  . NISSEN FUNDOPLICATION  4818    complicated by gastric perforation, peritonitis, ventral nernia   . RHIZOTOMY Right 11/17/2014   Procedure: RHIZOTOMY TO RIGHT V2,V3;  Surgeon: Erline Levine, MD;  Location: [place] [facility];  Service: Neurosurgery;  Laterality: Right;  right  . RHIZOTOMY Right 12/22/2014   Procedure: Right V2 and V3 trigeminal rhysolysis;  Surgeon: Erline Levine, MD;  Location: [place] [facility];  Service: Neurosurgery;  Laterality: Right;  Right V2 and V3 trigeminal rhysolysis  . RHIZOTOMY W/ RADIOFREQUENCY ABLATION  08/2017   Dr [name]  . UPPER GASTROINTESTINAL ENDOSCOPY    . VENTRAL HERNIA REPAIR  2004  Current Outpatient Medications  Medication Sig Dispense Refill  . acetaminophen (TYLENOL) 325 MG tablet Take 2 tablets (650 mg total) by mouth every 6 (six) hours as needed for mild pain, moderate pain or fever.    . ALPRAZolam (XANAX) 1 MG tablet Take 1 tablet (1 mg total) by mouth 4 (four) times daily as needed for anxiety. 120 tablet 2  . carbamazepine (TEGRETOL XR) 100 MG 12 hr tablet Take 100 mg by mouth 3 (three) times daily.    . cholecalciferol (VITAMIN D3) 25 MCG (1000 UNIT) tablet Take 1,000 Units by mouth daily.    . cyanocobalamin (,VITAMIN B-12,) 1000 MCG/ML injection 1 cc IM every 15 days (Patient taking differently: Inject 1,000 mcg into the muscle every 30 (thirty) days.) 6 mL 3  . dexlansoprazole (DEXILANT) 60 MG  capsule Take 1 capsule (60 mg total) by mouth daily. (Patient taking differently: Take 1 capsule by mouth daily as needed (gerd).) 30 capsule 0  . esomeprazole (NEXIUM) 40 MG capsule Take 40 mg by mouth daily at 12 noon.    . gabapentin (NEURONTIN) 400 MG capsule Take 400-800 mg by mouth See admin instructions. Taking 400mg  at 0800, 1400, then 2 capsules (800mg ) at bedtime.    Marland Kitchen losartan-hydrochlorothiazide (HYZAAR) 50-12.5 MG tablet Take 1 tablet by mouth daily. 90 tablet 3  . polyethylene glycol powder (GLYCOLAX/MIRALAX) powder DISSOLVE 1 CAPFUL IN LIQUID EVERY DAY AS NEEDED FOR CONSTIPATION (Patient taking differently: Take 17 g by mouth daily.) 527 g 2  . potassium chloride SA (KLOR-CON) 20 MEQ tablet Take 1 tablet (20 mEq total) by mouth 2 (two) times daily. 180 tablet 0  . Probiotic Product (ALIGN) 4 MG CAPS Take 4 mg by mouth daily.    . sucralfate (CARAFATE) 1 GM/10ML suspension Take 10 mLs (1 g total) by mouth 4 (four) times daily -  with meals and at bedtime. (Patient taking differently: Take 1 g by mouth 2 (two) times daily.) 420 mL 0  . tamsulosin (FLOMAX) 0.4 MG CAPS capsule Take 0.4 mg by mouth in the morning and at bedtime.    . triamcinolone (NASACORT) 55 MCG/ACT AERO nasal inhaler Place 2 sprays into the nose daily. (Patient taking differently: Place 2 sprays into the nose daily as needed (allergies).) 1 Inhaler 12  . Zinc 220 (50 Zn) MG CAPS Take 220 mg by mouth daily.     No current facility-administered medications for this visit.    Allergies:   Dilantin [phenytoin], Dilaudid [hydromorphone hcl], Sulfa antibiotics, Ace inhibitors, Augmentin [amoxicillin-pot clavulanate], and Codeine    Social History:  The patient  reports that he quit smoking about 24 years ago. His smoking use included cigarettes. He has a 20.00 pack-year smoking history. He quit smokeless tobacco use about 37 years ago.  His smokeless tobacco use included chew. He reports that he does not drink alcohol and  does not use drugs.   Family History:  The patient's family history includes Coronary artery disease in his mother; Diabetes in his brother, mother, and sister; Heart disease in his brother and brother; Hypertension in his brother.    ROS:  Please see the history of present illness.   Otherwise, review of systems are positive for none.   All other systems are reviewed and negative.    PHYSICAL EXAM: VS:  BP 126/86   Pulse (!) 56   Ht 5\' 11"  (1.803 m)   Wt 98.9 kg   SpO2 98%   BMI 30.40 kg/m  , BMI Body  mass index is 30.4 kg/m. Affect appropriate Healthy:  appears stated age 77: normal Neck supple with no adenopathy JVP normal no bruits no thyromegaly Lungs clear with no wheezing and good diaphragmatic motion Heart:  S1/S2 no murmur, no rub, gallop or click PMI normal Abdomen: benighn, multiple previous surgeries  no bruit.  No HSM or HJR Distal pulses intact with no bruits No edema Neuro non-focal Skin warm and dry No muscular weakness    EKG:  09/13/17  SR rate 85 ICRBBB  12/17/18 SR rate 70 normal ECG 02/04/2021 SR rate 56 normal    Recent Labs: 02/02/2021: ALT 9; BUN 8; Creatinine, Ser 0.90; Hemoglobin 15.9; Platelets 142.0; Potassium 4.6; Sodium 134; TSH 1.52    Lipid Panel    Component Value Date/Time   CHOL 162 02/02/2021 1113   TRIG 107.0 02/02/2021 1113   HDL 56.90 02/02/2021 1113   CHOLHDL 3 02/02/2021 1113   VLDL 21.4 02/02/2021 1113   LDLCALC 83 02/02/2021 1113      Wt Readings from Last 3 Encounters:  02/04/21 98.9 kg  02/02/21 99.2 kg  08/04/20 98 kg      Other studies Reviewed: Additional studies/ records that were reviewed today include: Notes from primary Notes from GI , ECG.    ASSESSMENT AND PLAN:  1.  Chest Pain:  Atypical no need for ETT normal ECG Family history CAD with calcium score 71 / 65 th percentile continue risk factor modification  2.  GI: Hospitalized for SBO 2/18-2/22/ 22 slipped Nissen fundoplication and multiple  previous attempts at ventral hernia repair F/U Dr Hassell Done   3.  HTN:  Well controlled.  Continue current medications and low sodium Dash type diet.   4.  HLD:  LDL 87 continue diet Rx  As calcium score is below average for age  67. COVID: mild resolved  Now vaccinated  6. Leg Pain: good pulses on exam no edema to suggest DVT Duplex 02/05/20 with no DVT f/u primary    Current medicines are reviewed at length with the patient today.  The patient does not have concerns regarding medicines.  The following changes have been made:  no change  Labs/ tests ordered today include: None   Orders Placed This Encounter  Procedures  . EKG 12-Lead     Disposition:   FU with cardiology  In a year     Signed, Jenkins Rouge, MD  02/04/2021 10:19 AM    [facility] [address], [place], [place]  [id number] Phone: [phone]; Fax: [phone]

## 2021-02-01 ENCOUNTER — Other Ambulatory Visit: Payer: Self-pay

## 2021-02-02 ENCOUNTER — Encounter: Payer: Self-pay | Admitting: Internal Medicine

## 2021-02-02 ENCOUNTER — Ambulatory Visit (INDEPENDENT_AMBULATORY_CARE_PROVIDER_SITE_OTHER): Payer: Medicare Other | Admitting: Internal Medicine

## 2021-02-02 VITALS — BP 140/80 | HR 55 | Wt 218.8 lb

## 2021-02-02 DIAGNOSIS — K56609 Unspecified intestinal obstruction, unspecified as to partial versus complete obstruction: Secondary | ICD-10-CM

## 2021-02-02 DIAGNOSIS — E559 Vitamin D deficiency, unspecified: Secondary | ICD-10-CM

## 2021-02-02 DIAGNOSIS — R739 Hyperglycemia, unspecified: Secondary | ICD-10-CM | POA: Diagnosis not present

## 2021-02-02 DIAGNOSIS — I700 Atherosclerosis of aorta: Secondary | ICD-10-CM | POA: Diagnosis not present

## 2021-02-02 DIAGNOSIS — F32A Depression, unspecified: Secondary | ICD-10-CM

## 2021-02-02 DIAGNOSIS — E538 Deficiency of other specified B group vitamins: Secondary | ICD-10-CM | POA: Diagnosis not present

## 2021-02-02 DIAGNOSIS — F419 Anxiety disorder, unspecified: Secondary | ICD-10-CM | POA: Diagnosis not present

## 2021-02-02 DIAGNOSIS — I10 Essential (primary) hypertension: Secondary | ICD-10-CM | POA: Diagnosis not present

## 2021-02-02 DIAGNOSIS — R52 Pain, unspecified: Secondary | ICD-10-CM | POA: Diagnosis not present

## 2021-02-02 DIAGNOSIS — Z0001 Encounter for general adult medical examination with abnormal findings: Secondary | ICD-10-CM

## 2021-02-02 DIAGNOSIS — J3089 Other allergic rhinitis: Secondary | ICD-10-CM | POA: Diagnosis not present

## 2021-02-02 DIAGNOSIS — E785 Hyperlipidemia, unspecified: Secondary | ICD-10-CM

## 2021-02-02 LAB — TSH: TSH: 1.52 u[IU]/mL (ref 0.35–4.50)

## 2021-02-02 LAB — CBC WITH DIFFERENTIAL/PLATELET
Basophils Absolute: 0 10*3/uL (ref 0.0–0.1)
Basophils Relative: 0.5 % (ref 0.0–3.0)
Eosinophils Absolute: 0.1 10*3/uL (ref 0.0–0.7)
Eosinophils Relative: 0.8 % (ref 0.0–5.0)
HCT: 45.9 % (ref 39.0–52.0)
Hemoglobin: 15.9 g/dL (ref 13.0–17.0)
Lymphocytes Relative: 27.6 % (ref 12.0–46.0)
Lymphs Abs: 2.2 10*3/uL (ref 0.7–4.0)
MCHC: 34.6 g/dL (ref 30.0–36.0)
MCV: 90.7 fl (ref 78.0–100.0)
Monocytes Absolute: 0.6 10*3/uL (ref 0.1–1.0)
Monocytes Relative: 7.5 % (ref 3.0–12.0)
Neutro Abs: 5 10*3/uL (ref 1.4–7.7)
Neutrophils Relative %: 63.6 % (ref 43.0–77.0)
Platelets: 142 10*3/uL — ABNORMAL LOW (ref 150.0–400.0)
RBC: 5.06 Mil/uL (ref 4.22–5.81)
RDW: 13.6 % (ref 11.5–15.5)
WBC: 7.9 10*3/uL (ref 4.0–10.5)

## 2021-02-02 LAB — URINALYSIS, ROUTINE W REFLEX MICROSCOPIC
Bilirubin Urine: NEGATIVE
Hgb urine dipstick: NEGATIVE
Ketones, ur: NEGATIVE
Nitrite: NEGATIVE
Specific Gravity, Urine: 1.01 (ref 1.000–1.030)
Total Protein, Urine: NEGATIVE
Urine Glucose: NEGATIVE
Urobilinogen, UA: 0.2 (ref 0.0–1.0)
pH: 7 (ref 5.0–8.0)

## 2021-02-02 LAB — HEPATIC FUNCTION PANEL
ALT: 9 U/L (ref 0–53)
AST: 12 U/L (ref 0–37)
Albumin: 4.1 g/dL (ref 3.5–5.2)
Alkaline Phosphatase: 69 U/L (ref 39–117)
Bilirubin, Direct: 0.1 mg/dL (ref 0.0–0.3)
Total Bilirubin: 0.6 mg/dL (ref 0.2–1.2)
Total Protein: 6.9 g/dL (ref 6.0–8.3)

## 2021-02-02 LAB — LIPID PANEL
Cholesterol: 162 mg/dL (ref 0–200)
HDL: 56.9 mg/dL (ref >39.00)
LDL Cholesterol: 83 mg/dL (ref 0–99)
NonHDL: 104.81
Total CHOL/HDL Ratio: 3
Triglycerides: 107 mg/dL (ref 0.0–149.0)
VLDL: 21.4 mg/dL (ref 0.0–40.0)

## 2021-02-02 LAB — BASIC METABOLIC PANEL
BUN: 8 mg/dL (ref 6–23)
CO2: 32 mEq/L (ref 19–32)
Calcium: 9.4 mg/dL (ref 8.4–10.5)
Chloride: 95 mEq/L — ABNORMAL LOW (ref 96–112)
Creatinine, Ser: 0.9 mg/dL (ref 0.40–1.50)
GFR: 87.68 mL/min (ref >60.00)
Glucose, Bld: 95 mg/dL (ref 70–99)
Potassium: 4.6 mEq/L (ref 3.5–5.1)
Sodium: 134 mEq/L — ABNORMAL LOW (ref 135–145)

## 2021-02-02 LAB — VITAMIN D 25 HYDROXY (VIT D DEFICIENCY, FRACTURES): VITD: 29.09 ng/mL — ABNORMAL LOW (ref 30.00–100.00)

## 2021-02-02 LAB — PSA: PSA: 10.82 ng/mL — ABNORMAL HIGH (ref 0.10–4.00)

## 2021-02-02 LAB — HEMOGLOBIN A1C: Hgb A1c MFr Bld: 5.4 % (ref 4.6–6.5)

## 2021-02-02 LAB — VITAMIN B12: Vitamin B-12: 579 pg/mL (ref 211–911)

## 2021-02-02 MED ORDER — METHYLPREDNISOLONE ACETATE 80 MG/ML IJ SUSP
80.0000 mg | Freq: Once | INTRAMUSCULAR | Status: AC
Start: 2021-02-02 — End: 2021-02-02
  Administered 2021-02-02: 80 mg via INTRAMUSCULAR

## 2021-02-02 MED ORDER — ALPRAZOLAM 1 MG PO TABS: 1.0000 mg | ORAL_TABLET | Freq: Four times a day (QID) | ORAL | 2 refills | Status: DC | PRN

## 2021-02-02 NOTE — Progress Notes (Signed)
Patient ID: [name], male   DOB: 1951/10/14, 70 y.o.   MRN: [id number]         Chief Complaint:: wellness exam and Follow-up  hld, hyperglycemia, htn, b12 deficiency, aortic atherosclerosis, anxiety/depression, alelrgies and recent SBO       HPI:  [name] is a 70 y.o. male here for wellness exam; up to date with preventive referrals and immunizations.                          Also with respect to recent SBO - Denies worsening reflux, abd pain, dysphagia, n/v, bowel change or blood. Due for f/u K with recent low K associated.   Also Does have several wks ongoing nasal allergy symptoms with clearish congestion, itch and sneezing, without fever, pain, ST, cough, swelling or wheezing.  Denies worsening depressive symptoms, suicidal ideation, or panic; has ongoing anxiety, asks for xanax refill.  Pt denies chest pain, increased sob or doe, wheezing, orthopnea, PND, increased LE swelling, palpitations, dizziness or syncope.   Pt denies polydipsia, polyuria, Denies new worsening focal neuro s/s.  No other new complaints     Wt Readings from Last 3 Encounters:  02/04/21 218 lb (98.9 kg)  02/02/21 218 lb 12.8 oz (99.2 kg)  08/04/20 216 lb (98 kg)   BP Readings from Last 3 Encounters:  02/04/21 126/86  02/02/21 140/80  12/27/20 (!) 143/92   Immunization History  Administered Date(s) Administered  . Influenza Whole 09/23/2007, 08/06/2010, 08/22/2011  . Influenza,inj,Quad PF,6+ Mos 09/21/2015, 07/14/2016  . Influenza-Unspecified 09/08/2014, 08/10/2017, 08/19/2019  . Moderna Sars-Covid-2 Vaccination 03/24/2020, 04/20/2020, 10/06/2020  . Pneumococcal Conjugate-13 11/12/2020  . Pneumococcal Polysaccharide-23 11/07/1999, 09/14/2017, 01/24/2018  . Td 05/03/2005  . Tdap 01/18/2017  There are no preventive care reminders to display for this patient.    Past Medical History:  Diagnosis Date  . Allergic rhinitis   . Anemia, pernicious   . Anxiety and depression    sees Dr.  [name]  . Arthritis   . B12 deficiency 01/23/2020  . Catheter-associated urinary tract infection ([place])   . Diverticulosis of colon   . Elevated PSA    and hypogonadism, sees urologist  . GERD (gastroesophageal reflux disease)    hiatal hernia  . Hx of blood transfusion reaction 1999  . Hyperlipidemia   . Hyperparathyroidism ([place])   . Hyperplastic colon polyp   . Hypertension    acei causes cough  . IBS (irritable bowel syndrome)   . Nephrolithiasis   . Obstructive chronic bronchitis without exacerbation, followed by Dr. [name] 03/04/2009   ONO RA  Normal 07/28/11 6 min walk >529m no desat PFTs 08/08/11: DLCO 70% TLC 80%  FeV1 80%   Fef 25 75 56% with significant improvement after BD   . Peritonitis ([place])    after surgery in 1999  . Pneumonia   . PONV (postoperative nausea and vomiting)   . Rectal fissure   . Trigeminal neuralgia    has facial asymetry  . Ventral hernia    Past Surgical History:  Procedure Laterality Date  . ABDOMINAL SURGERY     Multiple  . BRAIN SURGERY  01/2011   Trigeminal nerve/Dr Vertell Limber  . CHOLECYSTECTOMY  1981  . DENTAL SURGERY  2019   tooth extraction, root canal  . ESOPHAGEAL MANOMETRY N/A 04/28/2020   Procedure: ESOPHAGEAL MANOMETRY (EM);  Surgeon: Mauri Pole, MD;  [facility];  Service: Endoscopy;  Laterality: N/A;  .  HAND SURGERY  1973   Left  . HERNIA REPAIR  1999  . KNEE SURGERY  1997   Left  . LITHOTRIPSY     Right  . NISSEN FUNDOPLICATION  0947    complicated by gastric perforation, peritonitis, ventral nernia   . RHIZOTOMY Right 11/17/2014   Procedure: RHIZOTOMY TO RIGHT V2,V3;  Surgeon: Erline Levine, MD;  Location: [place] NEUR[facility] ORS;  Service: Neurosurgery;  Laterality: Right;  right  . RHIZOTOMY Right 12/22/2014   Procedure: Right V2 and V3 trigeminal rhysolysis;  Surgeon: Erline Levine, MD;  Location: [place] NEURO ORS;  Service: Neurosurgery;  Laterality: Right;  Right V2 and V3 trigeminal rhysolysis  . RHIZOTOMY W/  RADIOFREQUENCY ABLATION  08/2017   Dr [name]  . UPPER GASTROINTESTINAL ENDOSCOPY    . VENTRAL HERNIA REPAIR  2004    reports that he quit smoking about 24 years ago. His smoking use included cigarettes. He has a 20.00 pack-year smoking history. He quit smokeless tobacco use about 37 years ago.  His smokeless tobacco use included chew. He reports that he does not drink alcohol and does not use drugs. family history includes Coronary artery disease in his mother; Diabetes in his brother, mother, and sister; Heart disease in his brother and brother; Hypertension in his brother. Allergies  Allergen Reactions  . Dilantin [Phenytoin] Other (See Comments)    bradycardia  . Dilaudid [Hydromorphone Hcl] Nausea And Vomiting  . Sulfa Antibiotics Hives  . Ace Inhibitors Cough  . Augmentin [Amoxicillin-Pot Clavulanate] Nausea And Vomiting  . Codeine Nausea And Vomiting   Current Outpatient Medications on File Prior to Visit  Medication Sig Dispense Refill  . acetaminophen (TYLENOL) 325 MG tablet Take 2 tablets (650 mg total) by mouth every 6 (six) hours as needed for mild pain, moderate pain or fever.    . carbamazepine (TEGRETOL XR) 100 MG 12 hr tablet Take 100 mg by mouth 3 (three) times daily.    . cholecalciferol (VITAMIN D3) 25 MCG (1000 UNIT) tablet Take 1,000 Units by mouth daily.    . cyanocobalamin (,VITAMIN B-12,) 1000 MCG/ML injection 1 cc IM every 15 days (Patient taking differently: Inject 1,000 mcg into the muscle every 30 (thirty) days.) 6 mL 3  . dexlansoprazole (DEXILANT) 60 MG capsule Take 1 capsule (60 mg total) by mouth daily. (Patient taking differently: Take 1 capsule by mouth daily as needed (gerd).) 30 capsule 0  . esomeprazole (NEXIUM) 40 MG capsule Take 40 mg by mouth daily at 12 noon.    . gabapentin (NEURONTIN) 400 MG capsule Take 400-800 mg by mouth See admin instructions. Taking 400mg  at 0800, 1400, then 2 capsules (800mg ) at bedtime.    Marland Kitchen losartan-hydrochlorothiazide  (HYZAAR) 50-12.5 MG tablet Take 1 tablet by mouth daily. 90 tablet 3  . polyethylene glycol powder (GLYCOLAX/MIRALAX) powder DISSOLVE 1 CAPFUL IN LIQUID EVERY DAY AS NEEDED FOR CONSTIPATION (Patient taking differently: Take 17 g by mouth daily.) 527 g 2  . potassium chloride SA (KLOR-CON) 20 MEQ tablet Take 1 tablet (20 mEq total) by mouth 2 (two) times daily. 180 tablet 0  . Probiotic Product (ALIGN) 4 MG CAPS Take 4 mg by mouth daily.    . sucralfate (CARAFATE) 1 GM/10ML suspension Take 10 mLs (1 g total) by mouth 4 (four) times daily -  with meals and at bedtime. (Patient taking differently: Take 1 g by mouth 2 (two) times daily.) 420 mL 0  . tamsulosin (FLOMAX) 0.4 MG CAPS capsule Take 0.4 mg by mouth  in the morning and at bedtime.    . triamcinolone (NASACORT) 55 MCG/ACT AERO nasal inhaler Place 2 sprays into the nose daily. (Patient taking differently: Place 2 sprays into the nose daily as needed (allergies).) 1 Inhaler 12  . Zinc 220 (50 Zn) MG CAPS Take 220 mg by mouth daily.     No current facility-administered medications on file prior to visit.        ROS:  All others reviewed and negative.  Objective        PE:  BP 140/80 (BP Location: Right Arm, Patient Position: Sitting, Cuff Size: Large)   Pulse (!) 55   Wt 218 lb 12.8 oz (99.2 kg)   SpO2 97%   BMI 30.52 kg/m                 Constitutional: Pt appears in NAD               HENT: Head: NCAT.                Right Ear: External ear normal.                 Left Ear: External ear normal. Bilat tm's with mild erythema.  Max sinus areas non tender.  Pharynx with mild erythema, no exudate               Eyes: . Pupils are equal, round, and reactive to light. Conjunctivae and EOM are normal               Nose: without d/c or deformity               Neck: Neck supple. Gross normal ROM               Cardiovascular: Normal rate and regular rhythm.                 Pulmonary/Chest: Effort normal and breath sounds without rales or  wheezing.                Abd:  Soft, NT, ND, + BS, no organomegaly               Neurological: Pt is alert. At baseline orientation, motor grossly intact               Skin: Skin is warm. No rashes, no other new lesions, LE edema - none               Psychiatric: Pt behavior is normal without agitation   Micro: none  Cardiac tracings I have personally interpreted today:  none  Pertinent Radiological findings (summarize): none   Lab Results  Component Value Date   WBC 7.9 02/02/2021   HGB 15.9 02/02/2021   HCT 45.9 02/02/2021   PLT 142.0 (L) 02/02/2021   GLUCOSE 95 02/02/2021   CHOL 162 02/02/2021   TRIG 107.0 02/02/2021   HDL 56.90 02/02/2021   LDLCALC 83 02/02/2021   ALT 9 02/02/2021   AST 12 02/02/2021   NA 134 (L) 02/02/2021   K 4.6 02/02/2021   CL 95 (L) 02/02/2021   CREATININE 0.90 02/02/2021   BUN 8 02/02/2021   CO2 32 02/02/2021   TSH 1.52 02/02/2021   PSA 10.82 (H) 02/02/2021   HGBA1C 5.4 02/02/2021   Assessment/Plan:  [name] is a 70 y.o. White or Caucasian [1] male with  has a past medical history of Allergic rhinitis, Anemia, pernicious, Anxiety and depression, Arthritis,  B12 deficiency (01/23/2020), Catheter-associated urinary tract infection ([place]), Diverticulosis of colon, Elevated PSA, GERD (gastroesophageal reflux disease), blood transfusion reaction (1999), Hyperlipidemia, Hyperparathyroidism ([place]), Hyperplastic colon polyp, Hypertension, IBS (irritable bowel syndrome), Nephrolithiasis, Obstructive chronic bronchitis without exacerbation, followed by Dr. [name] (03/04/2009), Peritonitis ([place]), Pneumonia, PONV (postoperative nausea and vomiting), Rectal fissure, Trigeminal neuralgia, and Ventral hernia.  Encounter for well adult exam with abnormal findings Age and sex appropriate education and counseling updated with regular exercise and diet Referrals for preventative services - none needed Immunizations addressed - none needed Smoking counseling   - none needed Evidence for depression or other mood disorder - for xanax refill Most recent labs reviewed. I have personally reviewed and have noted: 1) the patient's medical and social history 2) The patient's current medications and supplements 3) The patient's height, weight, and BMI have been recorded in the chart   Hyperglycemia Lab Results  Component Value Date   HGBA1C 5.4 02/02/2021   Stable, pt to continue current medical treatment  - diet   HLD (hyperlipidemia) Lab Results  Component Value Date   LDLCALC 83 02/02/2021   Stable, pt to continue current statin  - diet  Essential hypertension BP Readings from Last 3 Encounters:  02/04/21 126/86  02/02/21 140/80  12/27/20 (!) 143/92   Stable, pt to continue medical treatment  - hyzaar   B12 deficiency Lab Results  Component Value Date   VITAMINB12 579 02/02/2021   Stable, cont oral replacement - b12 1000 mcg qd  Aortic atherosclerosis ([facility]) To cont low chol diet, declines statin  Anxiety and depression, followed by Dr. [name] in Psych Overall stable, pt can no longer afford psychiatry, ok for xanax refill  Allergic rhinitis With seasonal flare, for depomedrol im 80,  to f/u any worsening symptoms or concerns  SBO (small bowel obstruction) ([place]) Resolved, for K f/u with labs,  to f/u any worsening symptoms or concerns  Followup: Return in about 3 months (around 05/05/2021).  Cathlean Cower, MD 02/06/2021 7:13 PM [facility]

## 2021-02-02 NOTE — Patient Instructions (Signed)
You had the steroid shot today for the allergies  Please continue all other medications as before, including the xanax  Please have the pharmacy call with any other refills you may need.  Please continue your efforts at being more active, low cholesterol diet, and weight control.  You are otherwise up to date with prevention measures today.  Please keep your appointments with your specialists as you may have planned  Please go to the LAB at the blood drawing area for the tests to be done  You will be contacted by phone if any changes need to be made immediately.  Otherwise, you will receive a letter about your results with an explanation, but please check with MyChart first.  Please remember to sign up for MyChart if you have not done so, as this will be important to you in the future with finding out test results, communicating by private email, and scheduling acute appointments online when needed.  Please make an Appointment to return in 3 months

## 2021-02-03 LAB — DRUG SCREEN 764883 11+OXYCO+ALC+CRT-BUND
Amphetamines, Urine: NEGATIVE ng/mL
BENZODIAZ UR QL: NEGATIVE ng/mL
Barbiturate: NEGATIVE ng/mL
Cannabinoid Quant, Ur: NEGATIVE ng/mL
Cocaine (Metabolite): NEGATIVE ng/mL
Creatinine: 74.8 mg/dL (ref 20.0–300.0)
Ethanol: NEGATIVE %
Meperidine: NEGATIVE ng/mL
Methadone Screen, Urine: NEGATIVE ng/mL
OPIATE SCREEN URINE: NEGATIVE ng/mL
Oxycodone/Oxymorphone, Urine: NEGATIVE ng/mL
Phencyclidine: NEGATIVE ng/mL
Propoxyphene: NEGATIVE ng/mL
Tramadol: NEGATIVE ng/mL
pH, Urine: 6.7 (ref 4.5–8.9)

## 2021-02-04 ENCOUNTER — Other Ambulatory Visit: Payer: Self-pay

## 2021-02-04 ENCOUNTER — Encounter: Payer: Self-pay | Admitting: Cardiovascular Disease

## 2021-02-04 ENCOUNTER — Ambulatory Visit (INDEPENDENT_AMBULATORY_CARE_PROVIDER_SITE_OTHER): Payer: Medicare Other | Admitting: Cardiovascular Disease

## 2021-02-04 VITALS — BP 126/86 | HR 56 | Ht 71.0 in | Wt 218.0 lb

## 2021-02-04 DIAGNOSIS — R079 Chest pain, unspecified: Secondary | ICD-10-CM

## 2021-02-04 DIAGNOSIS — I10 Essential (primary) hypertension: Secondary | ICD-10-CM | POA: Diagnosis not present

## 2021-02-04 NOTE — Patient Instructions (Signed)

## 2021-02-06 ENCOUNTER — Encounter: Payer: Self-pay | Admitting: Internal Medicine

## 2021-02-06 NOTE — Assessment & Plan Note (Signed)
Overall stable, pt can no longer afford psychiatry, ok for xanax refill

## 2021-02-06 NOTE — Assessment & Plan Note (Signed)
BP Readings from Last 3 Encounters:  02/04/21 126/86  02/02/21 140/80  12/27/20 (!) 143/92   Stable, pt to continue medical treatment  - hyzaar

## 2021-02-06 NOTE — Assessment & Plan Note (Signed)
Lab Results  Component Value Date   HGBA1C 5.4 02/02/2021   Stable, pt to continue current medical treatment  - diet

## 2021-02-06 NOTE — Assessment & Plan Note (Signed)
To cont low chol diet, declines statin

## 2021-02-06 NOTE — Assessment & Plan Note (Signed)
Age and sex appropriate education and counseling updated with regular exercise and diet Referrals for preventative services - none needed Immunizations addressed - none needed Smoking counseling  - none needed Evidence for depression or other mood disorder - for xanax refill Most recent labs reviewed. I have personally reviewed and have noted: 1) the patient's medical and social history 2) The patient's current medications and supplements 3) The patient's height, weight, and BMI have been recorded in the chart

## 2021-02-06 NOTE — Assessment & Plan Note (Signed)
With seasonal flare, for depomedrol im 80,  to f/u any worsening symptoms or concerns

## 2021-02-06 NOTE — Assessment & Plan Note (Signed)
Lab Results  Component Value Date   [id number] 02/02/2021   Stable, cont oral replacement - b12 1000 mcg qd

## 2021-02-06 NOTE — Assessment & Plan Note (Signed)
Resolved, for K f/u with labs,  to f/u any worsening symptoms or concerns

## 2021-02-06 NOTE — Assessment & Plan Note (Signed)
Lab Results  Component Value Date   LDLCALC 83 02/02/2021   Stable, pt to continue current statin  - diet

## 2021-03-08 ENCOUNTER — Telehealth: Payer: Self-pay

## 2021-03-08 NOTE — Telephone Encounter (Signed)
Patient declined offer for an [facility] appt and stated they prefer to only see Dr. [name].

## 2021-03-14 DIAGNOSIS — H524 Presbyopia: Secondary | ICD-10-CM | POA: Diagnosis not present

## 2021-03-14 DIAGNOSIS — H2513 Age-related nuclear cataract, bilateral: Secondary | ICD-10-CM | POA: Diagnosis not present

## 2021-04-18 ENCOUNTER — Telehealth: Payer: Self-pay | Admitting: Gastroenterology

## 2021-04-18 MED ORDER — SUCRALFATE 1 GM/10ML PO SUSP: 1.0000 g | Freq: Three times a day (TID) | ORAL | 0 refills | Status: DC

## 2021-04-18 NOTE — Telephone Encounter (Signed)
Inbound call from patient wife. Need refill for Carafate pills to [facility]. Patient have an appointment 07/19/21 with Dr. Silverio Decamp.

## 2021-04-27 DIAGNOSIS — N413 Prostatocystitis: Secondary | ICD-10-CM | POA: Diagnosis not present

## 2021-05-03 DIAGNOSIS — R350 Frequency of micturition: Secondary | ICD-10-CM | POA: Diagnosis not present

## 2021-05-05 ENCOUNTER — Ambulatory Visit (INDEPENDENT_AMBULATORY_CARE_PROVIDER_SITE_OTHER): Payer: Medicare Other | Admitting: Internal Medicine

## 2021-05-05 ENCOUNTER — Encounter: Payer: Self-pay | Admitting: Internal Medicine

## 2021-05-05 ENCOUNTER — Other Ambulatory Visit: Payer: Self-pay

## 2021-05-05 VITALS — BP 120/80 | HR 89 | Ht 71.0 in | Wt 216.8 lb

## 2021-05-05 DIAGNOSIS — R739 Hyperglycemia, unspecified: Secondary | ICD-10-CM | POA: Diagnosis not present

## 2021-05-05 DIAGNOSIS — E538 Deficiency of other specified B group vitamins: Secondary | ICD-10-CM

## 2021-05-05 DIAGNOSIS — N320 Bladder-neck obstruction: Secondary | ICD-10-CM

## 2021-05-05 DIAGNOSIS — F419 Anxiety disorder, unspecified: Secondary | ICD-10-CM

## 2021-05-05 DIAGNOSIS — I10 Essential (primary) hypertension: Secondary | ICD-10-CM

## 2021-05-05 DIAGNOSIS — E559 Vitamin D deficiency, unspecified: Secondary | ICD-10-CM | POA: Diagnosis not present

## 2021-05-05 DIAGNOSIS — E7800 Pure hypercholesterolemia, unspecified: Secondary | ICD-10-CM | POA: Diagnosis not present

## 2021-05-05 DIAGNOSIS — F32A Depression, unspecified: Secondary | ICD-10-CM

## 2021-05-05 MED ORDER — CYANOCOBALAMIN 1000 MCG/ML IJ SOLN: INTRAMUSCULAR | 3 refills | Status: DC

## 2021-05-05 MED ORDER — ALPRAZOLAM 1 MG PO TABS: 1.0000 mg | ORAL_TABLET | Freq: Four times a day (QID) | ORAL | 5 refills | Status: DC | PRN

## 2021-05-05 MED ORDER — CHOLECALCIFEROL 50 MCG (2000 UT) PO TABS: ORAL_TABLET | ORAL | 99 refills | Status: DC

## 2021-05-05 NOTE — Assessment & Plan Note (Addendum)
Last vitamin D Lab Results  Component Value Date   VD25OH 29.09 (L) 02/02/2021   Low, to increase oral replacement to 2000 u qd

## 2021-05-05 NOTE — Patient Instructions (Addendum)
Please take OTC Vitamin D3 at 2000 units per day, indefinitely  Please continue all other medications as before, and refills have been done if requested - the xanax, and B12  Please have the pharmacy call with any other refills you may need.  Please continue your efforts at being more active, low cholesterol diet, and weight control.  Please keep your appointments with your specialists as you may have planned - urology for the Prostate MRI  Please make an Appointment to return after Nov 06, 2021, or sooner if needed

## 2021-05-05 NOTE — Progress Notes (Signed)
Patient ID: [name], male   DOB: [birth year], 70 y.o.   MRN: [id number]        Chief Complaint: follow up HTN, HLD and hyperglycemia, anxiety, b12 ad D deficiency       HPI:  [name] is a 70 y.o. male here overall doing well with wife present; Denies worsening depressive symptoms, suicidal ideation, or panic; has ongoing anxiety, not increased recently. Pt denies chest pain, increased sob or doe, wheezing, orthopnea, PND, increased LE swelling, palpitations, dizziness or syncope.  Pt denies polydipsia, polyuria, or new focal neuro s/s.  Takiing Vit d and b12 IM and needs rx refill.  Only taking Vit d 1000 u.  Denies urinary symptoms such as dysuria, frequency, urgency, flank pain, hematuria or n/v, fever, chills. Did have elevated PSA and for MRI prostate soon per urology.   Pt denies fever, wt loss, night sweats, loss of appetite, or other constitutional symptoms  No other new complaints       Wt Readings from Last 3 Encounters:  05/05/21 216 lb 12.8 oz (98.3 kg)  02/04/21 218 lb (98.9 kg)  02/02/21 218 lb 12.8 oz (99.2 kg)   BP Readings from Last 3 Encounters:  05/05/21 120/80  02/04/21 126/86  02/02/21 140/80         Past Medical History:  Diagnosis Date   Allergic rhinitis    Anemia, pernicious    Anxiety and depression    sees Dr. Evelene Croon   Arthritis    B12 deficiency 01/23/2020   Catheter-associated urinary tract infection (HCC)    Diverticulosis of colon    Elevated PSA    and hypogonadism, sees urologist   GERD (gastroesophageal reflux disease)    hiatal hernia   Hx of blood transfusion reaction 1999   Hyperlipidemia    Hyperparathyroidism (HCC)    Hyperplastic colon polyp    Hypertension    acei causes cough   IBS (irritable bowel syndrome)    Nephrolithiasis    Obstructive chronic bronchitis without exacerbation, followed by Dr. Delford Field 03/04/2009   ONO RA  Normal 07/28/11 6 min walk >539m no desat PFTs 08/08/11: DLCO 70% TLC 80%  FeV1 80%   Fef 25  75 56% with significant improvement after BD    Peritonitis (HCC)    after surgery in 1999   Pneumonia    PONV (postoperative nausea and vomiting)    Rectal fissure    Trigeminal neuralgia    has facial asymetry   Ventral hernia    Past Surgical History:  Procedure Laterality Date   ABDOMINAL SURGERY     Multiple   BRAIN SURGERY  01/2011   Trigeminal nerve/Dr Venetia Maxon   CHOLECYSTECTOMY  1981   DENTAL SURGERY  2019   tooth extraction, root canal   ESOPHAGEAL MANOMETRY N/A 04/28/2020   Procedure: ESOPHAGEAL MANOMETRY (EM);  Surgeon: Napoleon Form, MD;  Location: [facility];  Service: Endoscopy;  Laterality: N/A;   HAND SURGERY  1973   Left   HERNIA REPAIR  1999   KNEE SURGERY  1997   Left   LITHOTRIPSY     Right   NISSEN FUNDOPLICATION  1999    complicated by gastric perforation, peritonitis, ventral nernia    RHIZOTOMY Right 11/17/2014   Procedure: RHIZOTOMY TO RIGHT V2,V3;  Surgeon: Maeola Harman, MD;  Location: [facility];  Service: Neurosurgery;  Laterality: Right;  right   RHIZOTOMY Right 12/22/2014   Procedure: Right V2 and V3 trigeminal rhysolysis;  Surgeon:  Maeola Harman, MD;  Location: [facility];  Service: Neurosurgery;  Laterality: Right;  Right V2 and V3 trigeminal rhysolysis   RHIZOTOMY W/ RADIOFREQUENCY ABLATION  08/2017   Dr Ollen Bowl   UPPER GASTROINTESTINAL ENDOSCOPY     VENTRAL HERNIA REPAIR  2004    reports that he quit smoking about 24 years ago. His smoking use included cigarettes. He has a 20.00 pack-year smoking history. He quit smokeless tobacco use about 37 years ago.  His smokeless tobacco use included chew. He reports that he does not drink alcohol and does not use drugs. family history includes Coronary artery disease in his mother; Diabetes in his brother, mother, and sister; Heart disease in his brother and brother; Hypertension in his brother. Allergies  Allergen Reactions   Dilantin [Phenytoin] Other (See Comments)    bradycardia   Dilaudid  [Hydromorphone Hcl] Nausea And Vomiting   Sulfa Antibiotics Hives   Morphine    Ace Inhibitors Cough   Augmentin [Amoxicillin-Pot Clavulanate] Nausea And Vomiting   Codeine Nausea And Vomiting   Current Outpatient Medications on File Prior to Visit  Medication Sig Dispense Refill   acetaminophen (TYLENOL) 325 MG tablet Take 2 tablets (650 mg total) by mouth every 6 (six) hours as needed for mild pain, moderate pain or fever.     carbamazepine (TEGRETOL XR) 100 MG 12 hr tablet Take 100 mg by mouth 3 (three) times daily.     dexlansoprazole (DEXILANT) 60 MG capsule Take 1 capsule (60 mg total) by mouth daily. (Patient taking differently: Take 1 capsule by mouth daily as needed (gerd).) 30 capsule 0   esomeprazole (NEXIUM) 40 MG capsule Take 40 mg by mouth daily at 12 noon.     gabapentin (NEURONTIN) 400 MG capsule Take 400-800 mg by mouth See admin instructions. Taking 400mg  at 0800, 1400, then 2 capsules (800mg ) at bedtime.     losartan-hydrochlorothiazide (HYZAAR) 50-12.5 MG tablet Take 1 tablet by mouth daily. 90 tablet 3   polyethylene glycol powder (GLYCOLAX/MIRALAX) powder DISSOLVE 1 CAPFUL IN LIQUID EVERY DAY AS NEEDED FOR CONSTIPATION (Patient taking differently: Take 17 g by mouth daily.) 527 g 2   potassium chloride SA (KLOR-CON) 20 MEQ tablet Take 1 tablet (20 mEq total) by mouth 2 (two) times daily. 180 tablet 0   Probiotic Product (ALIGN) 4 MG CAPS Take 4 mg by mouth daily.     sucralfate (CARAFATE) 1 GM/10ML suspension Take 10 mLs (1 g total) by mouth 4 (four) times daily -  with meals and at bedtime. 420 mL 0   tamsulosin (FLOMAX) 0.4 MG CAPS capsule Take 0.4 mg by mouth in the morning and at bedtime.     triamcinolone (NASACORT) 55 MCG/ACT AERO nasal inhaler Place 2 sprays into the nose daily. (Patient taking differently: Place 2 sprays into the nose daily as needed (allergies).) 1 Inhaler 12   Zinc 220 (50 Zn) MG CAPS Take 220 mg by mouth daily.     finasteride (PROSCAR) 5 MG  tablet Take 5 mg by mouth daily.     PNEUMOVAX 23 25 MCG/0.5ML injection      No current facility-administered medications on file prior to visit.        ROS:  All others reviewed and negative.  Objective        PE:  BP 120/80 (BP Location: Left Arm, Patient Position: Sitting, Cuff Size: Large)   Pulse 89   Ht 5\' 11"  (1.803 m)   Wt 216 lb 12.8 oz (98.3 kg)  SpO2 97%   BMI 30.24 kg/m                 Constitutional: Pt appears in NAD               HENT: Head: NCAT.                Right Ear: External ear normal.                 Left Ear: External ear normal.                Eyes: . Pupils are equal, round, and reactive to light. Conjunctivae and EOM are normal               Nose: without d/c or deformity               Neck: Neck supple. Gross normal ROM               Cardiovascular: Normal rate and regular rhythm.                 Pulmonary/Chest: Effort normal and breath sounds without rales or wheezing.                Abd:  Soft, NT, ND, + BS, no organomegaly               Neurological: Pt is alert. At baseline orientation, motor grossly intact               Skin: Skin is warm. No rashes, no other new lesions, LE edema - none               Psychiatric: Pt behavior is normal without agitation , mild nervous  Micro: none  Cardiac tracings I have personally interpreted today:  none  Pertinent Radiological findings (summarize): none   Lab Results  Component Value Date   WBC 7.9 02/02/2021   HGB 15.9 02/02/2021   HCT 45.9 02/02/2021   PLT 142.0 (L) 02/02/2021   GLUCOSE 95 02/02/2021   CHOL 162 02/02/2021   TRIG 107.0 02/02/2021   HDL 56.90 02/02/2021   LDLCALC 83 02/02/2021   ALT 9 02/02/2021   AST 12 02/02/2021   NA 134 (L) 02/02/2021   K 4.6 02/02/2021   CL 95 (L) 02/02/2021   CREATININE 0.90 02/02/2021   BUN 8 02/02/2021   CO2 32 02/02/2021   TSH 1.52 02/02/2021   PSA 10.82 (H) 02/02/2021   HGBA1C 5.4 02/02/2021   Assessment/Plan:  [name] is a 70  y.o. White or Caucasian [1] male with  has a past medical history of Allergic rhinitis, Anemia, pernicious, Anxiety and depression, Arthritis, B12 deficiency (01/23/2020), Catheter-associated urinary tract infection (HCC), Diverticulosis of colon, Elevated PSA, GERD (gastroesophageal reflux disease), blood transfusion reaction (1999), Hyperlipidemia, Hyperparathyroidism (HCC), Hyperplastic colon polyp, Hypertension, IBS (irritable bowel syndrome), Nephrolithiasis, Obstructive chronic bronchitis without exacerbation, followed by Dr. Delford Field (03/04/2009), Peritonitis (HCC), Pneumonia, PONV (postoperative nausea and vomiting), Rectal fissure, Trigeminal neuralgia, and Ventral hernia.  Vitamin D deficiency Last vitamin D Lab Results  Component Value Date   VD25OH 29.09 (L) 02/02/2021   Low, to increase oral replacement to 2000 u qd  Anxiety and depression, followed by Dr. Evelene Croon in Psych Stable, continue currrent tx  B12 deficiency Lab Results  Component Value Date   VITAMINB12 579 02/02/2021   Stable, cont oral replacement - b12 1000 mcg IM with rx refill today  Essential hypertension BP  Readings from Last 3 Encounters:  05/05/21 120/80  02/04/21 126/86  02/02/21 140/80   Stable, pt to continue medical treatment hyzaar   HLD (hyperlipidemia) Lab Results  Component Value Date   LDLCALC 83 02/02/2021   Stable, pt to continue current low chol diet as LDL controlled < 100 with diet   Hyperglycemia Lab Results  Component Value Date   HGBA1C 5.4 02/02/2021   Stable, pt to continue current medical treatment  - diet and wt control  Followup: Return in about 6 months (around 11/04/2021).  Oliver Barre, MD 05/09/2021 2:46 PM [facility]

## 2021-05-06 ENCOUNTER — Other Ambulatory Visit: Payer: Self-pay | Admitting: Urology

## 2021-05-06 DIAGNOSIS — R972 Elevated prostate specific antigen [PSA]: Secondary | ICD-10-CM

## 2021-05-09 ENCOUNTER — Encounter: Payer: Self-pay | Admitting: Internal Medicine

## 2021-05-09 NOTE — Assessment & Plan Note (Signed)
Stable, continue currrent tx

## 2021-05-09 NOTE — Assessment & Plan Note (Signed)
Lab Results  Component Value Date   HGBA1C 5.4 02/02/2021   Stable, pt to continue current medical treatment  - diet and wt control

## 2021-05-09 NOTE — Assessment & Plan Note (Signed)
BP Readings from Last 3 Encounters:  05/05/21 120/80  02/04/21 126/86  02/02/21 140/80   Stable, pt to continue medical treatment hyzaar

## 2021-05-09 NOTE — Assessment & Plan Note (Signed)
Lab Results  Component Value Date   LDLCALC 83 02/02/2021   Stable, pt to continue current low chol diet as LDL controlled < 100 with diet

## 2021-05-09 NOTE — Assessment & Plan Note (Signed)
Lab Results  Component Value Date   SVXBLTJQ30 092 02/02/2021   Stable, cont oral replacement - b12 1000 mcg IM with rx refill today

## 2021-05-15 ENCOUNTER — Other Ambulatory Visit: Payer: Self-pay

## 2021-05-15 ENCOUNTER — Emergency Department (HOSPITAL_COMMUNITY): Payer: Medicare Other

## 2021-05-15 ENCOUNTER — Encounter (HOSPITAL_COMMUNITY): Payer: Self-pay | Admitting: Emergency Medicine

## 2021-05-15 ENCOUNTER — Observation Stay (HOSPITAL_COMMUNITY)
Admission: EM | Admit: 2021-05-15 | Discharge: 2021-05-16 | Disposition: A | Discharge status: Home / Self Care | Payer: Medicare Other | Attending: Family Medicine | Admitting: Family Medicine

## 2021-05-15 DIAGNOSIS — K56609 Unspecified intestinal obstruction, unspecified as to partial versus complete obstruction: Secondary | ICD-10-CM | POA: Diagnosis present

## 2021-05-15 DIAGNOSIS — Z79899 Other long term (current) drug therapy: Secondary | ICD-10-CM | POA: Diagnosis not present

## 2021-05-15 DIAGNOSIS — I10 Essential (primary) hypertension: Secondary | ICD-10-CM | POA: Diagnosis not present

## 2021-05-15 DIAGNOSIS — E871 Hypo-osmolality and hyponatremia: Secondary | ICD-10-CM | POA: Diagnosis present

## 2021-05-15 DIAGNOSIS — N3289 Other specified disorders of bladder: Secondary | ICD-10-CM | POA: Diagnosis not present

## 2021-05-15 DIAGNOSIS — K529 Noninfective gastroenteritis and colitis, unspecified: Secondary | ICD-10-CM | POA: Diagnosis not present

## 2021-05-15 DIAGNOSIS — K56600 Partial intestinal obstruction, unspecified as to cause: Secondary | ICD-10-CM | POA: Diagnosis present

## 2021-05-15 DIAGNOSIS — E785 Hyperlipidemia, unspecified: Secondary | ICD-10-CM | POA: Diagnosis present

## 2021-05-15 DIAGNOSIS — K219 Gastro-esophageal reflux disease without esophagitis: Secondary | ICD-10-CM | POA: Diagnosis not present

## 2021-05-15 DIAGNOSIS — Z87891 Personal history of nicotine dependence: Secondary | ICD-10-CM | POA: Diagnosis not present

## 2021-05-15 DIAGNOSIS — G500 Trigeminal neuralgia: Secondary | ICD-10-CM | POA: Diagnosis present

## 2021-05-15 DIAGNOSIS — K56699 Other intestinal obstruction unspecified as to partial versus complete obstruction: Secondary | ICD-10-CM | POA: Diagnosis not present

## 2021-05-15 DIAGNOSIS — F419 Anxiety disorder, unspecified: Secondary | ICD-10-CM | POA: Diagnosis present

## 2021-05-15 DIAGNOSIS — K5669 Other partial intestinal obstruction: Secondary | ICD-10-CM | POA: Diagnosis not present

## 2021-05-15 DIAGNOSIS — F32A Depression, unspecified: Secondary | ICD-10-CM | POA: Diagnosis present

## 2021-05-15 DIAGNOSIS — R109 Unspecified abdominal pain: Secondary | ICD-10-CM | POA: Diagnosis present

## 2021-05-15 LAB — COMPREHENSIVE METABOLIC PANEL
ALT: 13 U/L (ref 0–44)
AST: 16 U/L (ref 15–41)
Albumin: 4 g/dL (ref 3.5–5.0)
Alkaline Phosphatase: 71 U/L (ref 38–126)
Anion gap: 9 (ref 5–15)
BUN: 9 mg/dL (ref 8–23)
CO2: 27 mmol/L (ref 22–32)
Calcium: 9.5 mg/dL (ref 8.9–10.3)
Chloride: 98 mmol/L (ref 98–111)
Creatinine, Ser: 0.91 mg/dL (ref 0.61–1.24)
GFR, Estimated: >60 mL/min (ref >60)
Glucose, Bld: 99 mg/dL (ref 70–99)
Potassium: 4.1 mmol/L (ref 3.5–5.1)
Sodium: 134 mmol/L — ABNORMAL LOW (ref 135–145)
Total Bilirubin: 1 mg/dL (ref 0.3–1.2)
Total Protein: 7.6 g/dL (ref 6.5–8.1)

## 2021-05-15 LAB — URINALYSIS, ROUTINE W REFLEX MICROSCOPIC
Bacteria, UA: NONE SEEN
Bilirubin Urine: NEGATIVE
Glucose, UA: NEGATIVE mg/dL
Hgb urine dipstick: NEGATIVE
Ketones, ur: NEGATIVE mg/dL
Nitrite: NEGATIVE
Protein, ur: NEGATIVE mg/dL
Specific Gravity, Urine: 1.018 (ref 1.005–1.030)
pH: 6 (ref 5.0–8.0)

## 2021-05-15 LAB — CBC
HCT: 51.5 % (ref 39.0–52.0)
Hemoglobin: 17.6 g/dL — ABNORMAL HIGH (ref 13.0–17.0)
MCH: 31.9 pg (ref 26.0–34.0)
MCHC: 34.2 g/dL (ref 30.0–36.0)
MCV: 93.5 fL (ref 80.0–100.0)
Platelets: 156 10*3/uL (ref 150–400)
RBC: 5.51 MIL/uL (ref 4.22–5.81)
RDW: 12.7 % (ref 11.5–15.5)
WBC: 10.4 10*3/uL (ref 4.0–10.5)
nRBC: 0 % (ref 0.0–0.2)

## 2021-05-15 LAB — CREATININE, SERUM
Creatinine, Ser: 0.94 mg/dL (ref 0.61–1.24)
GFR, Estimated: >60 mL/min (ref >60)

## 2021-05-15 LAB — LIPASE, BLOOD: Lipase: 24 U/L (ref 11–51)

## 2021-05-15 MED ORDER — GABAPENTIN 400 MG PO CAPS
800.0000 mg | ORAL_CAPSULE | Freq: Every day | ORAL | Status: DC
  Administered 2021-05-15: 800 mg via ORAL
  Filled 2021-05-15: qty 2

## 2021-05-15 MED ORDER — ONDANSETRON HCL 4 MG/2ML IJ SOLN
4.0000 mg | Freq: Three times a day (TID) | INTRAMUSCULAR | Status: DC | PRN
  Administered 2021-05-15: 4 mg via INTRAVENOUS

## 2021-05-15 MED ORDER — LACTATED RINGERS IV SOLN: INTRAVENOUS | Status: DC

## 2021-05-15 MED ORDER — ONDANSETRON HCL 4 MG/2ML IJ SOLN
4.0000 mg | Freq: Once | INTRAMUSCULAR | Status: AC
  Administered 2021-05-15: 4 mg via INTRAVENOUS
  Filled 2021-05-15: qty 2

## 2021-05-15 MED ORDER — SODIUM CHLORIDE 0.9 % IV BOLUS
500.0000 mL | Freq: Once | INTRAVENOUS | Status: AC
  Administered 2021-05-15: 500 mL via INTRAVENOUS

## 2021-05-15 MED ORDER — CARBAMAZEPINE ER 200 MG PO TB12
200.0000 mg | ORAL_TABLET | Freq: Every day | ORAL | Status: DC
  Administered 2021-05-15: 200 mg via ORAL
  Filled 2021-05-15 (×3): qty 1

## 2021-05-15 MED ORDER — LOSARTAN POTASSIUM-HCTZ 50-12.5 MG PO TABS: 1.0000 | ORAL_TABLET | Freq: Every day | ORAL | Status: DC

## 2021-05-15 MED ORDER — IOHEXOL 300 MG/ML  SOLN
100.0000 mL | Freq: Once | INTRAMUSCULAR | Status: AC | PRN
  Administered 2021-05-15: 100 mL via INTRAVENOUS

## 2021-05-15 MED ORDER — ACETAMINOPHEN 650 MG RE SUPP
650.0000 mg | Freq: Four times a day (QID) | RECTAL | Status: DC | PRN
Start: 2021-05-15 — End: 2021-05-16

## 2021-05-15 MED ORDER — PANTOPRAZOLE SODIUM 40 MG PO TBEC
80.0000 mg | DELAYED_RELEASE_TABLET | Freq: Every day | ORAL | Status: DC
  Administered 2021-05-15: 80 mg via ORAL
  Administered 2021-05-16: 40 mg via ORAL
  Filled 2021-05-15 (×2): qty 2

## 2021-05-15 MED ORDER — FENTANYL CITRATE (PF) 100 MCG/2ML IJ SOLN
12.5000 ug | INTRAMUSCULAR | Status: DC | PRN
Start: 2021-05-15 — End: 2021-05-15
  Administered 2021-05-15: 12.5 ug via INTRAVENOUS
  Filled 2021-05-15: qty 2

## 2021-05-15 MED ORDER — GABAPENTIN 400 MG PO CAPS
400.0000 mg | ORAL_CAPSULE | Freq: Two times a day (BID) | ORAL | Status: DC
  Administered 2021-05-16 (×2): 400 mg via ORAL
  Filled 2021-05-15 (×2): qty 1

## 2021-05-15 MED ORDER — ALPRAZOLAM 0.5 MG PO TABS
1.0000 mg | ORAL_TABLET | Freq: Every day | ORAL | Status: DC
  Administered 2021-05-15: 1 mg via ORAL
  Filled 2021-05-15: qty 2

## 2021-05-15 MED ORDER — ACETAMINOPHEN 325 MG PO TABS: 650.0000 mg | ORAL_TABLET | Freq: Four times a day (QID) | ORAL | Status: DC | PRN

## 2021-05-15 MED ORDER — HYDROCHLOROTHIAZIDE 12.5 MG PO CAPS
12.5000 mg | ORAL_CAPSULE | Freq: Every day | ORAL | Status: DC
  Administered 2021-05-16: 12.5 mg via ORAL
  Filled 2021-05-15: qty 1

## 2021-05-15 MED ORDER — ENOXAPARIN SODIUM 60 MG/0.6ML IJ SOSY
50.0000 mg | PREFILLED_SYRINGE | INTRAMUSCULAR | Status: DC
  Administered 2021-05-15: 50 mg via SUBCUTANEOUS
  Filled 2021-05-15: qty 0.6

## 2021-05-15 MED ORDER — FINASTERIDE 5 MG PO TABS
5.0000 mg | ORAL_TABLET | Freq: Every day | ORAL | Status: DC
  Administered 2021-05-15 – 2021-05-16 (×2): 5 mg via ORAL
  Filled 2021-05-15 (×2): qty 1

## 2021-05-15 MED ORDER — FENTANYL CITRATE (PF) 100 MCG/2ML IJ SOLN
50.0000 ug | Freq: Once | INTRAMUSCULAR | Status: AC
  Administered 2021-05-15: 50 ug via INTRAVENOUS
  Filled 2021-05-15: qty 2

## 2021-05-15 MED ORDER — LOSARTAN POTASSIUM 50 MG PO TABS
50.0000 mg | ORAL_TABLET | Freq: Every day | ORAL | Status: DC
  Administered 2021-05-16: 50 mg via ORAL
  Filled 2021-05-15: qty 1

## 2021-05-15 MED ORDER — FENTANYL CITRATE (PF) 100 MCG/2ML IJ SOLN: 12.5000 ug | INTRAMUSCULAR | Status: DC | PRN

## 2021-05-15 MED ORDER — TAMSULOSIN HCL 0.4 MG PO CAPS
0.4000 mg | ORAL_CAPSULE | Freq: Every day | ORAL | Status: DC
  Administered 2021-05-15: 0.4 mg via ORAL
  Filled 2021-05-15: qty 1

## 2021-05-15 MED ORDER — CARBAMAZEPINE ER 100 MG PO TB12
100.0000 mg | ORAL_TABLET | Freq: Every day | ORAL | Status: DC
  Administered 2021-05-16: 100 mg via ORAL
  Filled 2021-05-15: qty 1

## 2021-05-15 NOTE — ED Triage Notes (Signed)
C/o generalized abd pain that radiates to back since 4am with nausea.  History of "intestinal blockage" x 2.  Reports small BM this morning.

## 2021-05-15 NOTE — H&P (Signed)
History and Physical    [name] FGH:[id number] DOB: 05/30/1951 DOA: 05/15/2021  PCP: [name], MD Consultants:  cardiology: Dr. Johnsie Cancel, Dr. Luz Brazen GI, Dr. [name]: psychiatry, urology: Dr. Milford Cage, Neurology: Dr. Vertell Limber. Surgeon: Dr. Hassell Done Patient coming from:  Home - lives with wife, Velva Harman   Chief Complaint: abdominal pain   HPI: [name] is a 70 y.o. male with medical history significant of HTN, GERD, trigeminal neuralgia, anxiety and depression,  hx of SBO obstruction x4.  He had a [employer] Fundoplication done in 6789 and had a perforation. He got peritonitis following surgery.  He then started to have SBO about 4 years ago and has had 4 total with his last one being in 12/2020. He presented to ER with abdominal pain that started around 4:30am. He states it has progressed all day and feels like his obstructions have felt in the past. Pain is generalized and radiates into his back. He rates the pain as 10/10 but has come down to 6/10. He describes the pain as stabbing in nature. He has been nauseated, but no vomiting. He has had no diarrhea and last BM was this AM. NO blood or abnormal stool. Nothing seems to make it better or worse. He last ate last night.   ED Course: vitals: bp: 120/80, HR: 72, RR: 18, oxygen: 100% on room air.  No pertinent labs. Imaging shows ileus/enteritis vs. Early sbo. Asked to admit. Surgery consulted by EDP. Asked to admit.   Review of Systems: As per HPI; otherwise review of systems reviewed and negative.  Ambulatory Status:  Ambulates without assistance   Past Medical History:  Diagnosis Date   Allergic rhinitis    Anemia, pernicious    Anxiety and depression    sees Dr. [name]   Arthritis    B12 deficiency 01/23/2020   Catheter-associated urinary tract infection ([place])    Diverticulosis of colon    Elevated PSA    and hypogonadism, sees urologist   GERD (gastroesophageal reflux disease)    hiatal hernia   Hx of blood transfusion  reaction 1999   Hyperlipidemia    Hyperparathyroidism ([place])    Hyperplastic colon polyp    Hypertension    acei causes cough   IBS (irritable bowel syndrome)    Nephrolithiasis    Obstructive chronic bronchitis without exacerbation, followed by Dr. [name] 03/04/2009   ONO RA  Normal 07/28/11 6 min walk >566m no desat PFTs 08/08/11: DLCO 70% TLC 80%  FeV1 80%   Fef 25 75 56% with significant improvement after BD    Peritonitis ([place])    after surgery in 1999   Pneumonia    PONV (postoperative nausea and vomiting)    Rectal fissure    Trigeminal neuralgia    has facial asymetry   Ventral hernia     Past Surgical History:  Procedure Laterality Date   ABDOMINAL SURGERY     Multiple   BRAIN SURGERY  01/2011   Trigeminal nerve/Dr O[place]  2019   tooth extraction, root canal   ESOPHAGEAL MANOMETRY N/A 04/28/2020   Procedure: ESOPHAGEAL MANOMETRY (EM);  Surgeon: Mauri Pole, MD;  Location: [facility];  Service: Endoscopy;  Laterality: N/A;   HAND SURGERY  1973   Left   HERNIA REPAIR  1999   KNEE SURGERY  1997   Left   LITHOTRIPSY     Right   NISSEN FUNDOPLICATION  3810    complicated  by gastric perforation, peritonitis, ventral nernia    RHIZOTOMY Right 11/17/2014   Procedure: RHIZOTOMY TO RIGHT V2,V3;  Surgeon: Erline Levine, MD;  Location: [place] [facility];  Service: Neurosurgery;  Laterality: Right;  right   RHIZOTOMY Right 12/22/2014   Procedure: Right V2 and V3 trigeminal rhysolysis;  Surgeon: Erline Levine, MD;  Location: [place] NE[facility] OR[facility];  Service: Neurosurgery;  Laterality: Right;  Right V2 and V3 trigeminal rhysolysis   RHIZOTOMY W/ RADIOFREQUENCY ABLATION  08/2017   Dr [name]   UPPER GASTROINTESTINAL ENDOSCOPY     VENTRAL HERNIA REPAIR  2004    Social History   Socioeconomic History   Marital status: Married    Spouse name: Not on file   Number of children: 2   Years of education: Not on file   Highest education level: Not on  file  Occupational History   Occupation: Disabled - psych  Tobacco Use   Smoking status: Former    Packs/day: 1.00    Years: 20.00    Pack years: 20.00    Types: Cigarettes    Quit date: 11/06/1996    Years since quitting: 24.5   Smokeless tobacco: Former    Types: Chew    Quit date: 11/07/1983  Vaping Use   Vaping Use: Never used  Substance and Sexual Activity   Alcohol use: No   Drug use: No   Sexual activity: Yes    Partners: Female  Other Topics Concern   Not on file  Social History Narrative   Daily Caffeine Use:  Yes         Social Determinants of Health   Financial Resource Strain: Not on file  Food Insecurity: Not on file  Transportation Needs: Not on file  Physical Activity: Not on file  Stress: Not on file  Social Connections: Not on file  Intimate Partner Violence: Not on file    Allergies  Allergen Reactions   Dilantin [Phenytoin] Other (See Comments)    bradycardia   Dilaudid [Hydromorphone Hcl] Nausea And Vomiting   Sulfa Antibiotics Hives   Morphine    Ace Inhibitors Cough   Augmentin [Amoxicillin-Pot Clavulanate] Nausea And Vomiting   Codeine Nausea And Vomiting    Family History  Problem Relation Age of Onset   Diabetes Mother    Coronary artery disease Mother        CABG   Diabetes Sister    Diabetes Brother    Hypertension Brother    Heart disease Brother    Heart disease Brother    Colon cancer Neg Hx    Esophageal cancer Neg Hx    Stomach cancer Neg Hx     Prior to Admission medications   Medication Sig Start Date End Date Taking? Authorizing Provider  acetaminophen (TYLENOL) 325 MG tablet Take 2 tablets (650 mg total) by mouth every 6 (six) hours as needed for mild pain, moderate pain or fever. 12/26/19  Yes Norm Parcel, PA-C  ALPRAZolam Duanne Moron) 1 MG tablet Take 1 tablet (1 mg total) by mouth 4 (four) times daily as needed for anxiety. Patient taking differently: Take 1 mg by mouth at bedtime. 05/05/21  Yes [name], MD   carbamazepine (TEGRETOL XR) 100 MG 12 hr tablet Take 100-200 mg by mouth See admin instructions. 100mg  in the morning and 200mg  at night.   Yes [provider]  Cholecalciferol 50 MCG (2000 UT) TABS 1 tab by mouth once daily Patient taking differently: Take 1 tablet by mouth daily. 05/05/21  Yes [name], MD  cyanocobalamin (,VITAMIN B-12,) 1000 MCG/ML injection 1 cc IM every 15 days Patient taking differently: Inject 1,000 mcg into the muscle every 14 (fourteen) days. 05/05/21  Yes [name], MD  dexlansoprazole (DEXILANT) 60 MG capsule Take 1 capsule (60 mg total) by mouth daily. Patient taking differently: Take 60 mg by mouth as needed (gerd). 01/21/20  Yes Nandigam, Venia Minks, MD  esomeprazole (NEXIUM) 40 MG capsule Take 40 mg by mouth daily at 12 noon.   Yes [provider]  finasteride (PROSCAR) 5 MG tablet Take 5 mg by mouth daily. 05/03/21  Yes [provider]  gabapentin (NEURONTIN) 400 MG capsule Take 400-800 mg by mouth See admin instructions. Taking 400mg  at 0800, 1400, then 2 capsules (800mg ) at bedtime. 06/16/20  Yes [provider]  losartan-hydrochlorothiazide (HYZAAR) 50-12.5 MG tablet Take 1 tablet by mouth daily. 07/14/20  Yes [name], MD  polyethylene glycol powder (GLYCOLAX/MIRALAX) powder DISSOLVE 1 CAPFUL IN LIQUID EVERY DAY AS NEEDED FOR CONSTIPATION Patient taking differently: Take 17 g by mouth daily. 02/18/14  Yes Colin Benton R, DO  potassium chloride SA (KLOR-CON) 20 MEQ tablet Take 1 tablet (20 mEq total) by mouth 2 (two) times daily. Patient taking differently: Take 20 mEq by mouth See admin instructions. Pt can only take sometimes; makes him sick. 04/16/20  Yes Janith Lima, MD  Probiotic Product (ALIGN) 4 MG CAPS Take 4 mg by mouth daily.   Yes [provider]  sucralfate (CARAFATE) 1 g tablet Take 1 g by mouth 4 (four) times daily -  with meals and at bedtime.   Yes [provider]  tamsulosin (FLOMAX)  0.4 MG CAPS capsule Take 0.4 mg by mouth at bedtime. 06/21/20  Yes [provider]  triamcinolone (NASACORT) 55 MCG/ACT AERO nasal inhaler Place 2 sprays into the nose daily. Patient taking differently: Place 2 sprays into the nose daily as needed (allergies). 05/06/20  Yes [name], MD  Zinc 220 (50 Zn) MG CAPS Take 220 mg by mouth daily.   Yes [provider]  sucralfate (CARAFATE) 1 GM/10ML suspension Take 10 mLs (1 g total) by mouth 4 (four) times daily -  with meals and at bedtime. Patient not taking: No sig reported 04/18/21   Mauri Pole, MD    Physical Exam: Vitals:   05/15/21 1545 05/15/21 1600 05/15/21 1615 05/15/21 1630  BP: (!) 157/96 (!) 161/99 (!) 151/76 (!) 164/90  Pulse: 64 65 63 64  Resp: 12 11 18 17   Temp:      TempSrc:      SpO2: 98% 98% 99% 100%     General:  Appears calm and comfortable and is in NAD. Slightly diaphoretic.  Eyes:  PERRL, EOMI, normal lids, iris ENT:  grossly normal hearing, lips & tongue, mmm; appropriate dentition Neck:  no LAD, masses or thyromegaly; no carotid bruits Cardiovascular:  RRR, no m/r/g. No LE edema.  Respiratory:   CTA bilaterally with no wheezes/rales/rhonchi.  Normal respiratory effort. Abdomen:  soft, TTP mainly in epigastric and upper quadrants, but globally tender. Ventral hernia on left quadrant. BS+. No rebound or guarding.  Back:   normal alignment, no CVAT Skin:  no rash or induration seen on limited exam. Large vertical scar on abdomen Musculoskeletal:  grossly normal tone BUE/BLE, good ROM, no bony abnormality Lower extremity:  No LE edema.  Limited foot exam with no ulcerations.  2+ distal pulses. Psychiatric:  grossly normal mood  and affect, speech fluent and appropriate, AOx3 Neurologic:  CN 2-12 grossly intact, moves all extremities in coordinated fashion, sensation intact    Radiological Exams on Admission: Independently reviewed - see discussion in A/P where applicable  CT Abdomen  Pelvis W Contrast  Result Date: 05/15/2021 CLINICAL DATA:  70 year old male with acute abdominal and pelvic pain. EXAM: CT ABDOMEN AND PELVIS WITH CONTRAST TECHNIQUE: Multidetector CT imaging of the abdomen and pelvis was performed using the standard protocol following bolus administration of intravenous contrast. CONTRAST:  177mL OMNIPAQUE IOHEXOL 300 MG/ML  SOLN COMPARISON:  12/24/2020 FINDINGS: Lower chest: No acute abnormality Hepatobiliary: The liver is unremarkable. The patient is status post cholecystectomy. No biliary dilatation. Pancreas: Unremarkable Spleen: Unremarkable Adrenals/Urinary Tract: The kidneys are unremarkable except for stable small low-density lesions/cysts within both kidneys. The adrenal glands are unremarkable. Circumferential bladder wall thickening is again noted. Stomach/Bowel: Mildly distended loops of small bowel are noted, primarily within the central/left abdomen. Fluid within the remainder of the small bowel and colon noted. No discrete transition point is noted. Colonic diverticulosis noted without evidence of acute diverticulitis. Surgical changes along proximal stomach noted. Vascular/Lymphatic: Aortic atherosclerosis. No enlarged abdominal or pelvic lymph nodes. Reproductive: Marked prostate enlargement again noted. Other: No ascites, pneumoperitoneum or focal collection. Musculoskeletal: No acute or suspicious bony abnormalities are noted. IMPRESSION: 1. Mildly distended loops of small bowel primarily within the central/left abdomen with fluid within the remainder of the small bowel and colon. No discrete transition point identified. This is nonspecific but favor ileus/enteritis over low-grade obstruction. No pneumoperitoneum or abscess. 2. Marked prostate enlargement and circumferential bladder wall thickening again noted. 3. Aortic Atherosclerosis (ICD10-I70.0). Electronically Signed   By: Margarette Canada M.D.   On: 05/15/2021 14:42    EKG: Independently reviewed.  NSR with  rate 71; nonspecific ST changes with no evidence of acute ischemia   Labs on Admission: I have personally reviewed the available labs and imaging studies at the time of the admission.  Pertinent labs:  Hgb: 17.6   Assessment/Plan Principal Problem:   Small bowel obstruction (HCC) -early SBO vs. Ileus/enteritis  -general surgery following -NPO, if starts vomiting will need NG tube but okay for now -light IVF -fentanyl for pain PRN as allergy to morphine/dilaudid and codeine.  -lovenox prophylaxis okay per surgery  Active Problems:   Essential hypertension -stable  -continue home medication     Trigeminal neuralgia, s/p NSU, followed by Dr. Vertell Limber -continue home medication     Anxiety and depression, followed by Dr. [name] in Psych Stable, continue home medication     HLD (hyperlipidemia) -stable, continue home medication -lipid panel to goal: 01/2021   Hyponatremia -at baseline -trend   GERD -continue home PPI -held carafate  There is no height or weight on file to calculate BMI.   Level of care: Med-Surg DVT prophylaxis:  Lovenox Code Status:  Full - confirmed with patient Family Communication: None present Disposition Plan:  The patient is from: home  Anticipated d/c is to: home   Patient meets inpatient criteria due to SBO requiring intensive monitoring with possible need for surgical intervention.    Consults called: general surgery: Dr. Grandville Silos by EDP  Admission status:  inpatient    Orma Flaming MD [facility]   How to contact the [facility] Attending or Consulting provider [place] or covering provider during after hours [place], for this patient?  Check the care team in [facility] and look for a) attending/consulting [place] provider listed and b)  the [facility] team listed Log into [email or web address] and use 's universal password to access. If you do not have the password, please contact the hospital operator. Locate the [facility] provider you are looking for under  [facility] and page to a number that you can be directly reached. If you still have difficulty reaching the provider, please page the [facility] (Director on Call) for the Hospitalists listed on amion for assistance.   05/15/2021, 4:54 PM

## 2021-05-15 NOTE — ED Notes (Signed)
RN attempted report x1.  

## 2021-05-15 NOTE — ED Provider Notes (Signed)
[facility] EMERGENCY DEPARTMENT Provider Note   CSN: [id number] Arrival date & time: 05/15/21  [id number]     History Chief Complaint  Patient presents with   Abdominal Pain    [name] is a 70 y.o. male.  HPI Patient presents with his wife who assists with the history.  Patient has multiple medical issues including prior bowel obstruction.  His history is notable for surgery of fundoplication complicated by perforation, reportedly, decades ago.  Since that time he has had at least 4 bowel obstructions. That he presents with pain diffusely throughout the abdomen that began overnight, has worsened steadily.  Pain is severe, sore, diffuse, possibly worse in the left upper quadrant.  There is associated nausea and anorexia, no vomiting yet. He notes this is a typical time course for prior bowel obstructions. No fever, chills, chest pain, posterior thoracic pain, weakness in any extremity.     Past Medical History:  Diagnosis Date   Allergic rhinitis    Anemia, pernicious    Anxiety and depression    sees Dr. [name]   Arthritis    B12 deficiency 01/23/2020   Catheter-associated urinary tract infection ([place])    Diverticulosis of colon    Elevated PSA    and hypogonadism, sees urologist   GERD (gastroesophageal reflux disease)    hiatal hernia   Hx of blood transfusion reaction 1999   Hyperlipidemia    Hyperparathyroidism ([place])    Hyperplastic colon polyp    Hypertension    acei causes cough   IBS (irritable bowel syndrome)    Nephrolithiasis    Obstructive chronic bronchitis without exacerbation, followed by Dr. [name] 03/04/2009   ONO RA  Normal 07/28/11 6 min walk >536m no desat PFTs 08/08/11: DLCO 70% TLC 80%  FeV1 80%   Fef 25 75 56% with significant improvement after BD    Peritonitis ([place])    after surgery in 1999   Pneumonia    PONV (postoperative nausea and vomiting)    Rectal fissure    Trigeminal neuralgia    has facial asymetry   Ventral  hernia     Patient Active Problem List   Diagnosis Date Noted   Small bowel obstruction ([place]) 05/15/2021   Vitamin D deficiency 05/05/2021   Aortic atherosclerosis ([place]) 02/02/2021   Strain of neck muscle 12/15/2020   History of trigeminal neuralgia 06/21/2020   Orthostatic lightheadedness 06/21/2020   Neck pain 06/21/2020   Weakness of masseter muscle 06/16/2020   Diuretic-induced hypokalemia 04/16/2020   Lumbar radiculopathy, right 02/03/2020   Right leg pain 01/25/2020   B12 deficiency 01/23/2020   HLD (hyperlipidemia) 01/23/2020   Urinary retention 01/11/2020   COVID-19 virus infection 12/24/2019   SBO (small bowel obstruction) ([place]) 12/20/2019   Body mass index (BMI) 31.0-31.9, adult 12/16/2019   Essential (primary) hypertension 12/16/2019   Multifocal pneumonia 09/12/2017   Enteritis 09/12/2017   Hyponatremia 09/12/2017   CAP (community acquired pneumonia) 09/12/2017   Weakness of jaw muscles 08/21/2017   Acute sinusitis 07/06/2017   Hyperglycemia 01/18/2017   Maxillary sinusitis 01/03/2017   Encounter for well adult exam with abnormal findings 01/13/2016   Absolute anemia 10/15/2015   Cervical spondylosis, treated by Dr. Gladstone Lighter, [facility] 06/12/2013   Ventral hernia 03/04/2013   Anxiety and depression, followed by Dr. [name] in Psych 11/19/2012   Hiatal hernia, followed by Dr. Hassell Done ([place]) and Dr. Olevia Perches (GI) 11/19/2012   Other testicular hypofunction, followed by Dr. Karsten Ro in Urology 10/15/2012  Trigeminal neuralgia, s/p NSU, followed by Dr. Vertell Limber 10/15/2012   BPH (benign prostatic hyperplasia), followed by Dr. Karsten Ro 07/26/2011   Rosacea 09/02/2010   COLONIC POLYPS, HYPERPLASTIC, HX OF 12/13/2009   Obstructive chronic bronchitis without exacerbation, followed by Dr. [name] 03/04/2009   Allergic rhinitis 04/03/2008   Essential hypertension 05/08/2007   GERD followed by Dr. Olevia Perches 05/08/2007    Past Surgical History:  Procedure Laterality Date    ABDOMINAL SURGERY     Multiple   BRAIN SURGERY  01/2011   Trigeminal nerve/Dr Paoli  2019   tooth extraction, root canal   ESOPHAGEAL MANOMETRY N/A 04/28/2020   Procedure: ESOPHAGEAL MANOMETRY (EM);  Surgeon: Mauri Pole, MD;  Location: [facility];  Service: Endoscopy;  Laterality: N/A;   HAND SURGERY  1973   Left   HERNIA REPAIR  1999   KNEE SURGERY  1997   Left   LITHOTRIPSY     Right   NISSEN FUNDOPLICATION  8756    complicated by gastric perforation, peritonitis, ventral nernia    RHIZOTOMY Right 11/17/2014   Procedure: RHIZOTOMY TO RIGHT V2,V3;  Surgeon: Erline Levine, MD;  Location: [place] [facility] ORS;  Service: Neurosurgery;  Laterality: Right;  right   RHIZOTOMY Right 12/22/2014   Procedure: Right V2 and V3 trigeminal rhysolysis;  Surgeon: Erline Levine, MD;  Location: [place] NEURO ORS;  Service: Neurosurgery;  Laterality: Right;  Right V2 and V3 trigeminal rhysolysis   RHIZOTOMY W/ RADIOFREQUENCY ABLATION  08/2017   Dr [name]   UPPER GASTROINTESTINAL ENDOSCOPY     VENTRAL HERNIA REPAIR  2004       Family History  Problem Relation Age of Onset   Diabetes Mother    Coronary artery disease Mother        CABG   Diabetes Sister    Diabetes Brother    Hypertension Brother    Heart disease Brother    Heart disease Brother    Colon cancer Neg Hx    Esophageal cancer Neg Hx    Stomach cancer Neg Hx     Social History   Tobacco Use   Smoking status: Former    Packs/day: 1.00    Years: 20.00    Pack years: 20.00    Types: Cigarettes    Quit date: 11/06/1996    Years since quitting: 24.5   Smokeless tobacco: Former    Types: Chew    Quit date: 11/07/1983  Vaping Use   Vaping Use: Never used  Substance Use Topics   Alcohol use: No   Drug use: No    Home Medications Prior to Admission medications   Medication Sig Start Date End Date Taking? Authorizing Provider  acetaminophen (TYLENOL) 325 MG tablet Take 2 tablets (650 mg  total) by mouth every 6 (six) hours as needed for mild pain, moderate pain or fever. 12/26/19  Yes Norm Parcel, PA-C  ALPRAZolam Duanne Moron) 1 MG tablet Take 1 tablet (1 mg total) by mouth 4 (four) times daily as needed for anxiety. Patient taking differently: Take 1 mg by mouth at bedtime. 05/05/21  Yes [name], MD  carbamazepine (TEGRETOL XR) 100 MG 12 hr tablet Take 100-200 mg by mouth See admin instructions. 100mg  in the morning and 200mg  at night.   Yes [provider]  Cholecalciferol 50 MCG (2000 UT) TABS 1 tab by mouth once daily Patient taking differently: Take 1 tablet by mouth daily. 05/05/21  Yes Cathlean Cower  W, MD  cyanocobalamin (,VITAMIN B-12,) 1000 MCG/ML injection 1 cc IM every 15 days Patient taking differently: Inject 1,000 mcg into the muscle every 14 (fourteen) days. 05/05/21  Yes [name], MD  dexlansoprazole (DEXILANT) 60 MG capsule Take 1 capsule (60 mg total) by mouth daily. Patient taking differently: Take 60 mg by mouth as needed (gerd). 01/21/20  Yes Nandigam, Venia Minks, MD  esomeprazole (NEXIUM) 40 MG capsule Take 40 mg by mouth daily at 12 noon.   Yes [provider]  finasteride (PROSCAR) 5 MG tablet Take 5 mg by mouth daily. 05/03/21  Yes [provider]  gabapentin (NEURONTIN) 400 MG capsule Take 400-800 mg by mouth See admin instructions. Taking 400mg  at 0800, 1400, then 2 capsules (800mg ) at bedtime. 06/16/20  Yes [provider]  losartan-hydrochlorothiazide (HYZAAR) 50-12.5 MG tablet Take 1 tablet by mouth daily. 07/14/20  Yes [name], MD  polyethylene glycol powder (GLYCOLAX/MIRALAX) powder DISSOLVE 1 CAPFUL IN LIQUID EVERY DAY AS NEEDED FOR CONSTIPATION Patient taking differently: Take 17 g by mouth daily. 02/18/14  Yes Colin Benton R, DO  potassium chloride SA (KLOR-CON) 20 MEQ tablet Take 1 tablet (20 mEq total) by mouth 2 (two) times daily. Patient taking differently: Take 20 mEq by mouth See admin instructions. Pt  can only take sometimes; makes him sick. 04/16/20  Yes Janith Lima, MD  Probiotic Product (ALIGN) 4 MG CAPS Take 4 mg by mouth daily.   Yes [provider]  sucralfate (CARAFATE) 1 g tablet Take 1 g by mouth 4 (four) times daily -  with meals and at bedtime.   Yes [provider]  tamsulosin (FLOMAX) 0.4 MG CAPS capsule Take 0.4 mg by mouth at bedtime. 06/21/20  Yes [provider]  triamcinolone (NASACORT) 55 MCG/ACT AERO nasal inhaler Place 2 sprays into the nose daily. Patient taking differently: Place 2 sprays into the nose daily as needed (allergies). 05/06/20  Yes [name], MD  Zinc 220 (50 Zn) MG CAPS Take 220 mg by mouth daily.   Yes [provider]  sucralfate (CARAFATE) 1 GM/10ML suspension Take 10 mLs (1 g total) by mouth 4 (four) times daily -  with meals and at bedtime. Patient not taking: No sig reported 04/18/21   Mauri Pole, MD    Allergies    Dilantin [phenytoin], Dilaudid [hydromorphone hcl], Sulfa antibiotics, Morphine, Ace inhibitors, Augmentin [amoxicillin-pot clavulanate], and Codeine  Review of Systems   Review of Systems  Constitutional:        Per HPI, otherwise negative  HENT:         Per HPI, otherwise negative  Respiratory:         Per HPI, otherwise negative  Cardiovascular:        Per HPI, otherwise negative  Gastrointestinal:  Negative for vomiting.  Endocrine:       Negative aside from HPI  Genitourinary:        Neg aside from HPI   Musculoskeletal:        Per HPI, otherwise negative  Skin: Negative.   Neurological:  Negative for syncope.   Physical Exam Updated Vital Signs BP (!) 154/95   Pulse 62   Temp 97.8 F (36.6 C) (Oral)   Resp 11   SpO2 99%   Physical Exam Vitals and nursing note reviewed.  Constitutional:      General: He is not in acute distress.    Appearance: He is well-developed.  HENT:  Head: Normocephalic and atraumatic.  Eyes:     Conjunctiva/sclera: Conjunctivae  normal.  Cardiovascular:     Rate and Rhythm: Normal rate and regular rhythm.  Pulmonary:     Effort: Pulmonary effort is normal. No respiratory distress.     Breath sounds: No stridor.  Abdominal:     General: There is no distension.     Tenderness: There is guarding.  Skin:    General: Skin is warm and dry.  Neurological:     Mental Status: He is alert and oriented to person, place, and time.    ED Results / Procedures / Treatments   Labs (all labs ordered are listed, but only abnormal results are displayed) Labs Reviewed  COMPREHENSIVE METABOLIC PANEL - Abnormal; Notable for the following components:      Result Value   Sodium 134 (*)    All other components within normal limits  CBC - Abnormal; Notable for the following components:   Hemoglobin 17.6 (*)    All other components within normal limits  URINALYSIS, ROUTINE W REFLEX MICROSCOPIC - Abnormal; Notable for the following components:   APPearance HAZY (*)    Leukocytes,Ua TRACE (*)    All other components within normal limits  LIPASE, BLOOD    EKG EKG Interpretation  Date/Time:  Sunday May 15 2021 09:33:22 EDT Ventricular Rate:  71 PR Interval:  148 QRS Duration: 90 QT Interval:  374 QTC Calculation: 406 R Axis:   49 Text Interpretation: Normal sinus rhythm Normal ECG Confirmed by Carmin Muskrat [id number]) on 05/15/2021 11:45:26 AM  Radiology CT Abdomen Pelvis W Contrast  Result Date: 05/15/2021 CLINICAL DATA:  70 year old male with acute abdominal and pelvic pain. EXAM: CT ABDOMEN AND PELVIS WITH CONTRAST TECHNIQUE: Multidetector CT imaging of the abdomen and pelvis was performed using the standard protocol following bolus administration of intravenous contrast. CONTRAST:  173mL OMNIPAQUE IOHEXOL 300 MG/ML  SOLN COMPARISON:  12/24/2020 FINDINGS: Lower chest: No acute abnormality Hepatobiliary: The liver is unremarkable. The patient is status post cholecystectomy. No biliary dilatation. Pancreas: Unremarkable  Spleen: Unremarkable Adrenals/Urinary Tract: The kidneys are unremarkable except for stable small low-density lesions/cysts within both kidneys. The adrenal glands are unremarkable. Circumferential bladder wall thickening is again noted. Stomach/Bowel: Mildly distended loops of small bowel are noted, primarily within the central/left abdomen. Fluid within the remainder of the small bowel and colon noted. No discrete transition point is noted. Colonic diverticulosis noted without evidence of acute diverticulitis. Surgical changes along proximal stomach noted. Vascular/Lymphatic: Aortic atherosclerosis. No enlarged abdominal or pelvic lymph nodes. Reproductive: Marked prostate enlargement again noted. Other: No ascites, pneumoperitoneum or focal collection. Musculoskeletal: No acute or suspicious bony abnormalities are noted. IMPRESSION: 1. Mildly distended loops of small bowel primarily within the central/left abdomen with fluid within the remainder of the small bowel and colon. No discrete transition point identified. This is nonspecific but favor ileus/enteritis over low-grade obstruction. No pneumoperitoneum or abscess. 2. Marked prostate enlargement and circumferential bladder wall thickening again noted. 3. Aortic Atherosclerosis (ICD10-I70.0). Electronically Signed   By: Margarette Canada M.D.   On: 05/15/2021 14:42    Procedures Procedures   Medications Ordered in ED Medications  sodium chloride 0.9 % bolus 500 mL (500 mLs Intravenous New Bag/Given 05/15/21 1311)  fentaNYL (SUBLIMAZE) injection 50 mcg (50 mcg Intravenous Given 05/15/21 1307)  ondansetron (ZOFRAN) injection 4 mg (4 mg Intravenous Given 05/15/21 1305)  iohexol (OMNIPAQUE) 300 MG/ML solution 100 mL (100 mLs Intravenous Contrast Given 05/15/21 1337)    ED  Course  I have reviewed the triage vital signs and the nursing notes.  Pertinent labs & imaging results that were available during my care of the patient were reviewed by me and considered  in my medical decision making (see chart for details).  With consideration of broad differential including aortic disruption, bowel obstruction, diverticulitis, patient had CT scan ordered, labs ordered in triage.  Fluids, analgesia, antiemetics ordered.   3:58 PM Patient continues to complain of nausea.  He and his wife are aware of all findings including CT that is abnormal, although now without obvious transition point.  Given his history, description of progression of symptoms, there is suspicion for early bowel obstruction.  I discussed this case with our general surgery team, and they will follow as a consulting service. Patient will be admitted to the internal medicine team for ongoing monitoring, management, symptom control. MDM Rules/Calculators/A&P MDM Number of Diagnoses or Management Options SBO (small bowel obstruction) ([place]): established, worsening   Amount and/or Complexity of Data Reviewed Clinical lab tests: ordered and reviewed Tests in the radiology section of CPT: ordered and reviewed Tests in the medicine section of CPT: reviewed and ordered Decide to obtain previous medical records or to obtain history from someone other than the patient: yes Obtain history from someone other than the patient: yes Review and summarize past medical records: yes Discuss the patient with other providers: yes Independent visualization of images, tracings, or specimens: yes  Risk of Complications, Morbidity, and/or Mortality Presenting problems: high Diagnostic procedures: high Management options: high  Critical Care Total time providing critical care: < 30 minutes  Patient Progress Patient progress: stable   Final Clinical Impression(s) / ED Diagnoses Final diagnoses:  SBO (small bowel obstruction) ([place])     Carmin Muskrat, MD 05/15/21 1600

## 2021-05-15 NOTE — Consult Note (Signed)
Reason for Consult:SBO Referring Physician: [name] is an 70 y.o. male.  HPI: 70yo M well known to our service with a history of HTN and GERD, S/P Nissen fundoplication in 3557, multiple abdominal surgeries subsequently for hernia repairs. He has been hospitalized for SBO several times, most recently in February of this year. He is followed in our office by Jeffrey. Hassell Morgan. He awoke at 4am with abdominal pain today. He also had nausea but no emesis. +small BM. The pain and nausea persisted and he was worried he had an SBO so he came to the ED. CT A/P showed ileus/enteritis vs low-grade SBO. I was asked to see for surgical management.  Past Medical History:  Diagnosis Date   Allergic rhinitis    Anemia, pernicious    Anxiety and depression    sees Jeffrey. Toy Morgan   Arthritis    B12 deficiency 01/23/2020   Catheter-associated urinary tract infection ([name])    Diverticulosis of colon    Elevated PSA    and hypogonadism, sees urologist   GERD (gastroesophageal reflux disease)    hiatal hernia   Hx of blood transfusion reaction 1999   Hyperlipidemia    Hyperparathyroidism ([place])    Hyperplastic colon polyp    Hypertension    acei causes cough   IBS (irritable bowel syndrome)    Nephrolithiasis    Obstructive chronic bronchitis without exacerbation, followed by Jeffrey. Joya Morgan 03/04/2009   ONO RA  Normal 07/28/11 6 min walk >547m no desat PFTs 08/08/11: DLCO 70% TLC 80%  FeV1 80%   Fef 25 75 56% with significant improvement after BD    Peritonitis ([place])    after surgery in 1999   Pneumonia    PONV (postoperative nausea and vomiting)    Rectal fissure    Trigeminal neuralgia    has facial asymetry   Ventral hernia     Past Surgical History:  Procedure Laterality Date   ABDOMINAL SURGERY     Multiple   BRAIN SURGERY  01/2011   Trigeminal nerve/Jeffrey [name]  2019   tooth extraction, root canal   ESOPHAGEAL MANOMETRY N/A 04/28/2020    Procedure: ESOPHAGEAL MANOMETRY (EM);  Surgeon: Jeffrey Pole, MD;  Location: [facility];  Service: Endoscopy;  Laterality: N/A;   HAND SURGERY  1973   Left   HERNIA REPAIR  1999   KNEE SURGERY  1997   Left   LITHOTRIPSY     Right   NISSEN FUNDOPLICATION  3220    complicated by gastric perforation, peritonitis, ventral nernia    RHIZOTOMY Right 11/17/2014   Procedure: RHIZOTOMY TO RIGHT V2,V3;  Surgeon: Jeffrey Levine, MD;  Location: [name];  Service: Neurosurgery;  Laterality: Right;  right   RHIZOTOMY Right 12/22/2014   Procedure: Right V2 and V3 trigeminal rhysolysis;  Surgeon: Jeffrey Levine, MD;  Location: [facility];  Service: Neurosurgery;  Laterality: Right;  Right V2 and V3 trigeminal rhysolysis   RHIZOTOMY W/ RADIOFREQUENCY ABLATION  08/2017   [name]   UPPER GASTROINTESTINAL ENDOSCOPY     VENTRAL HERNIA REPAIR  2004    Family History  Problem Relation Age of Onset   Diabetes Mother    Coronary artery disease Mother        CABG   Diabetes Sister    Diabetes Brother    Hypertension Brother    Heart disease Brother    Heart disease Brother  Colon cancer Neg Hx    Esophageal cancer Neg Hx    Stomach cancer Neg Hx     Social History:  reports that he quit smoking about 24 years ago. His smoking use included cigarettes. He has a 20.00 pack-year smoking history. He quit smokeless tobacco use about 37 years ago.  His smokeless tobacco use included chew. He reports that he does not drink alcohol and does not use drugs.  Allergies:  Allergies  Allergen Reactions   Dilantin [Phenytoin] Other (See Comments)    bradycardia   Dilaudid [Hydromorphone Hcl] Nausea And Vomiting   Sulfa Antibiotics Hives   Morphine    Ace Inhibitors Cough   Augmentin [Amoxicillin-Pot Clavulanate] Nausea And Vomiting   Codeine Nausea And Vomiting    Medications: I have reviewed the patient's current medications.  Results for orders placed or performed during the hospital  encounter of 05/15/21 (from the past 48 hour(s))  Lipase, blood     Status: None   Collection Time: 05/15/21  9:43 AM  Result Value Ref Range   Lipase 24 11 - 51 U/L    Comment: Performed at [facility], [address] [place], [place] [address]  Comprehensive metabolic panel     Status: Abnormal   Collection Time: 05/15/21  9:43 AM  Result Value Ref Range   Sodium 134 (L) 135 - 145 mmol/L   Potassium 4.1 3.5 - 5.1 mmol/L   Chloride 98 98 - 111 mmol/L   CO2 27 22 - 32 mmol/L   Glucose, Bld 99 70 - 99 mg/dL    Comment: Glucose reference range applies only to samples taken after fasting for at least 8 hours.   BUN 9 8 - 23 mg/dL   Creatinine, Ser 0.91 0.61 - 1.24 mg/dL   Calcium 9.5 8.9 - 10.3 mg/dL   Total Protein 7.6 6.5 - 8.1 g/dL   Albumin 4.0 3.5 - 5.0 g/dL   AST 16 15 - 41 U/L   ALT 13 0 - 44 U/L   Alkaline Phosphatase 71 38 - 126 U/L   Total Bilirubin 1.0 0.3 - 1.2 mg/dL   GFR, Estimated >60 >60 mL/min    Comment: (NOTE) Calculated using the CKD-EPI Creatinine Equation (2021)    Anion gap 9 5 - 15    Comment: Performed at [facility] [address] [place], [place] [address]  CBC     Status: Abnormal   Collection Time: 05/15/21  9:43 AM  Result Value Ref Range   WBC 10.4 4.0 - 10.5 K/uL   RBC 5.51 4.22 - 5.81 MIL/uL   Hemoglobin 17.6 (H) 13.0 - 17.0 g/dL   HCT 51.5 39.0 - 52.0 %   MCV 93.5 80.0 - 100.0 fL   MCH 31.9 26.0 - 34.0 pg   MCHC 34.2 30.0 - 36.0 g/dL   RDW 12.7 11.5 - 15.5 %   Platelets 156 150 - 400 K/uL   nRBC 0.0 0.0 - 0.2 %    Comment: Performed at [facility], [place] [address] [place], [place] [address]  Urinalysis, Routine w reflex microscopic Urine, Clean Catch     Status: Abnormal   Collection Time: 05/15/21  9:43 AM  Result Value Ref Range   Color, Urine YELLOW YELLOW   APPearance HAZY (A) CLEAR   Specific Gravity, Urine 1.018 1.005 - 1.030   pH 6.0 5.0 - 8.0   Glucose, UA NEGATIVE NEGATIVE mg/dL   Hgb urine dipstick  NEGATIVE  NEGATIVE   Bilirubin Urine NEGATIVE NEGATIVE   Ketones, ur NEGATIVE NEGATIVE mg/dL   Protein, ur NEGATIVE NEGATIVE mg/dL   Nitrite NEGATIVE NEGATIVE   Leukocytes,Ua TRACE (A) NEGATIVE   RBC / HPF 0-5 0 - 5 RBC/hpf   WBC, UA 0-5 0 - 5 WBC/hpf   Bacteria, UA NONE SEEN NONE SEEN   Mucus PRESENT    Ca Oxalate Crys, UA PRESENT     Comment: Performed at [facility] [address] [place], [place] [address]    CT Abdomen Pelvis W Contrast  Result Date: 05/15/2021 CLINICAL DATA:  70 year old male with acute abdominal and pelvic pain. EXAM: CT ABDOMEN AND PELVIS WITH CONTRAST TECHNIQUE: Multidetector CT imaging of the abdomen and pelvis was performed using the standard protocol following bolus administration of intravenous contrast. CONTRAST:  179mL OMNIPAQUE IOHEXOL 300 MG/ML  SOLN COMPARISON:  12/24/2020 FINDINGS: Lower chest: No acute abnormality Hepatobiliary: The liver is unremarkable. The patient is status post cholecystectomy. No biliary dilatation. Pancreas: Unremarkable Spleen: Unremarkable Adrenals/Urinary Tract: The kidneys are unremarkable except for stable small low-density lesions/cysts within both kidneys. The adrenal glands are unremarkable. Circumferential bladder wall thickening is again noted. Stomach/Bowel: Mildly distended loops of small bowel are noted, primarily within the central/left abdomen. Fluid within the remainder of the small bowel and colon noted. No discrete transition point is noted. Colonic diverticulosis noted without evidence of acute diverticulitis. Surgical changes along proximal stomach noted. Vascular/Lymphatic: Aortic atherosclerosis. No enlarged abdominal or pelvic lymph nodes. Reproductive: Marked prostate enlargement again noted. Other: No ascites, pneumoperitoneum or focal collection. Musculoskeletal: No acute or suspicious bony abnormalities are noted. IMPRESSION: 1. Mildly distended loops of small bowel primarily within the central/left abdomen  with fluid within the remainder of the small bowel and colon. No discrete transition point identified. This is nonspecific but favor ileus/enteritis over low-grade obstruction. No pneumoperitoneum or abscess. 2. Marked prostate enlargement and circumferential bladder wall thickening again noted. 3. Aortic Atherosclerosis (ICD10-I70.0). Electronically Signed   By: Margarette Canada M.D.   On: 05/15/2021 14:42    Review of Systems  Constitutional:  Positive for appetite change.  HENT: Negative.    Eyes: Negative.   Respiratory:  Negative for chest tightness and shortness of breath.   Cardiovascular:  Negative for chest pain.  Gastrointestinal:  Positive for abdominal distention, abdominal pain and nausea. Negative for constipation, diarrhea and vomiting.  Genitourinary: Negative.   Musculoskeletal:  Positive for back pain.  Skin: Negative.   Allergic/Immunologic: Negative.   Neurological: Negative.   Hematological: Negative.   Psychiatric/Behavioral: Negative.    Blood pressure (!) 154/95, pulse 62, temperature 97.8 F (36.6 C), temperature source Oral, resp. rate 11, SpO2 99 %. Physical Exam Constitutional:      Appearance: He is well-developed.  HENT:     Head: Normocephalic.  Eyes:     Extraocular Movements: Extraocular movements intact.     Pupils: Pupils are equal, round, and reactive to light.  Cardiovascular:     Rate and Rhythm: Normal rate and regular rhythm.     Heart sounds: Normal heart sounds.  Pulmonary:     Effort: Pulmonary effort is normal.     Breath sounds: Normal breath sounds.  Abdominal:     General: A surgical scar is present. Bowel sounds are increased. There is distension.     Palpations: Abdomen is soft.     Tenderness: There is no abdominal tenderness. There is no guarding or rebound.     Comments: Mild distention,  LUQ incisional hernia reduces easily  Skin:    General: Skin is warm and dry.  Neurological:     Mental Status: He is alert and oriented to  person, place, and time.  Psychiatric:        Mood and Affect: Mood normal.    Assessment/Plan: Ileus/enteritis vs low-grade SBO - agree with medical admission, IVF and bowel rest. If he begins vomiting, would place NGT and do SBO protocol. No need for emergent surgery.  LUQ incisional hernia - reduces easily, not causing obstruction  I also spoke with his wife. We will follow.  Zenovia Jarred 05/15/2021, 3:42 PM

## 2021-05-15 NOTE — ED Notes (Signed)
IV team at bedside 

## 2021-05-16 DIAGNOSIS — K5669 Other partial intestinal obstruction: Secondary | ICD-10-CM | POA: Diagnosis not present

## 2021-05-16 DIAGNOSIS — K56699 Other intestinal obstruction unspecified as to partial versus complete obstruction: Secondary | ICD-10-CM | POA: Diagnosis not present

## 2021-05-16 DIAGNOSIS — K56609 Unspecified intestinal obstruction, unspecified as to partial versus complete obstruction: Secondary | ICD-10-CM | POA: Diagnosis not present

## 2021-05-16 LAB — BASIC METABOLIC PANEL
Anion gap: 9 (ref 5–15)
BUN: 6 mg/dL — ABNORMAL LOW (ref 8–23)
CO2: 26 mmol/L (ref 22–32)
Calcium: 8.5 mg/dL — ABNORMAL LOW (ref 8.9–10.3)
Chloride: 101 mmol/L (ref 98–111)
Creatinine, Ser: 0.77 mg/dL (ref 0.61–1.24)
GFR, Estimated: >60 mL/min (ref >60)
Glucose, Bld: 99 mg/dL (ref 70–99)
Potassium: 3.5 mmol/L (ref 3.5–5.1)
Sodium: 136 mmol/L (ref 135–145)

## 2021-05-16 LAB — CBC
HCT: 45.1 % (ref 39.0–52.0)
Hemoglobin: 15.9 g/dL (ref 13.0–17.0)
MCH: 31.2 pg (ref 26.0–34.0)
MCHC: 35.3 g/dL (ref 30.0–36.0)
MCV: 88.6 fL (ref 80.0–100.0)
Platelets: 141 10*3/uL — ABNORMAL LOW (ref 150–400)
RBC: 5.09 MIL/uL (ref 4.22–5.81)
RDW: 12.3 % (ref 11.5–15.5)
WBC: 7.5 10*3/uL (ref 4.0–10.5)
nRBC: 0 % (ref 0.0–0.2)

## 2021-05-16 NOTE — Care Management Obs Status (Signed)
MEDICARE OBSERVATION STATUS NOTIFICATION   Patient Details  Name: [name] MRN: [id number] Date of Birth: 06-05-1951   Medicare Observation Status Notification Given:  Yes    Marilu Favre, RN 05/16/2021, 2:55 PM

## 2021-05-16 NOTE — Progress Notes (Addendum)
Progress Note     Subjective: CC: feeling significantly improved. Still having very mild abdominal pain. Has been NPO and without nausea or emesis. Passing flatus. Last BM yesterday am   Objective: Vital signs in last 24 hours: Temp:  [97.6 F (36.4 C)-98.1 F (36.7 C)] 97.9 F (36.6 C) (07/11 0513) Pulse Rate:  [62-72] 66 (07/11 0513) Resp:  [9-20] 18 (07/11 0513) BP: (125-164)/(74-100) 136/91 (07/11 0513) SpO2:  [98 %-100 %] 98 % (07/11 0513)    Intake/Output from previous day: 07/10 0701 - 07/11 0700 In: 500 [IV Piggyback:500] Out: 175 [Urine:175] Intake/Output this shift: No intake/output data recorded.  PE: General: pleasant, WD, male who is laying in bed in NAD HEENT: head is normocephalic, atraumatic. Mouth is pink and moist Heart:  Palpable radial pulses bilaterally Lungs: Respiratory effort nonlabored Abd: soft, ND, +BS, very mild TTP along midline well healed surgical scar. LUQ incisional hernia reduces easily MS: all 4 extremities are symmetrical with no cyanosis, clubbing, or edema. Skin: warm and dry with no masses, lesions, or rashes Psych: A&Ox3 with an appropriate affect.    Lab Results:  Recent Labs    05/15/21 0943 05/16/21 0127  WBC 10.4 7.5  HGB 17.6* 15.9  HCT 51.5 45.1  PLT 156 141*   BMET Recent Labs    05/15/21 0943 05/16/21 0127  NA 134* 136  K 4.1 3.5  CL 98 101  CO2 27 26  GLUCOSE 99 99  BUN 9 6*  CREATININE 0.94  0.91 0.77  CALCIUM 9.5 8.5*   PT/INR No results for input(s): LABPROT, INR in the last 72 hours. CMP     Component Value Date/Time   NA 136 05/16/2021 0127   K 3.5 05/16/2021 0127   CL 101 05/16/2021 0127   CO2 26 05/16/2021 0127   GLUCOSE 99 05/16/2021 0127   GLUCOSE 118 (H) 10/25/2006 1414   BUN 6 (L) 05/16/2021 0127   CREATININE 0.77 05/16/2021 0127   CREATININE 0.97 12/31/2012 1108   CALCIUM 8.5 (L) 05/16/2021 0127   CALCIUM 9.3 01/23/2012 1045   PROT 7.6 05/15/2021 0943   ALBUMIN 4.0  05/15/2021 0943   AST 16 05/15/2021 0943   ALT 13 05/15/2021 0943   ALKPHOS 71 05/15/2021 0943   BILITOT 1.0 05/15/2021 0943   GFRNONAA >60 05/16/2021 0127   GFRAA >60 12/26/2019 0722   Lipase     Component Value Date/Time   LIPASE 24 05/15/2021 0943       Studies/Results: CT Abdomen Pelvis W Contrast  Result Date: 05/15/2021 CLINICAL DATA:  70 year old male with acute abdominal and pelvic pain. EXAM: CT ABDOMEN AND PELVIS WITH CONTRAST TECHNIQUE: Multidetector CT imaging of the abdomen and pelvis was performed using the standard protocol following bolus administration of intravenous contrast. CONTRAST:  12mL OMNIPAQUE IOHEXOL 300 MG/ML  SOLN COMPARISON:  12/24/2020 FINDINGS: Lower chest: No acute abnormality Hepatobiliary: The liver is unremarkable. The patient is status post cholecystectomy. No biliary dilatation. Pancreas: Unremarkable Spleen: Unremarkable Adrenals/Urinary Tract: The kidneys are unremarkable except for stable small low-density lesions/cysts within both kidneys. The adrenal glands are unremarkable. Circumferential bladder wall thickening is again noted. Stomach/Bowel: Mildly distended loops of small bowel are noted, primarily within the central/left abdomen. Fluid within the remainder of the small bowel and colon noted. No discrete transition point is noted. Colonic diverticulosis noted without evidence of acute diverticulitis. Surgical changes along proximal stomach noted. Vascular/Lymphatic: Aortic atherosclerosis. No enlarged abdominal or pelvic lymph nodes. Reproductive: Marked prostate enlargement again noted.  Other: No ascites, pneumoperitoneum or focal collection. Musculoskeletal: No acute or suspicious bony abnormalities are noted. IMPRESSION: 1. Mildly distended loops of small bowel primarily within the central/left abdomen with fluid within the remainder of the small bowel and colon. No discrete transition point identified. This is nonspecific but favor  ileus/enteritis over low-grade obstruction. No pneumoperitoneum or abscess. 2. Marked prostate enlargement and circumferential bladder wall thickening again noted. 3. Aortic Atherosclerosis (ICD10-I70.0). Electronically Signed   By: Margarette Canada M.D.   On: 05/15/2021 14:42    Anti-infectives: Anti-infectives (From admission, onward)    None        Assessment/Plan Ileus/enteritis vs low-grade SBO - history of Nissen fundoplication and multiple hernia repairs with h/o prior SBOs - ambulate this am - start on clears - if pain worsens or N/V develops back to NPO and consider NGT  LUQ incisional hernia - remains reducible  FEN: clears, IVF per primary ID: none VTE: lovenox Follow up: Dr. Hassell Done  Per medicine team: HTN Trigeminal neurolagia Anxiety, depression HLD GERD Hypnoatremia    LOS: 1 day    Winferd Humphrey, [facility] 05/16/2021, 7:44 AM Please see Amion for pager number during day hours 7:00am-4:30pm

## 2021-05-16 NOTE — Care Management CC44 (Signed)
Condition Code 44 Documentation Completed  Patient Details  Name: [name] MRN: [id number] Date of Birth: Mar 13, 1951   Condition Code 44 given:  Yes Patient signature on Condition Code 44 notice:  Yes Documentation of 2 MD's agreement:  Yes Code 44 added to claim:  Yes    Marilu Favre, RN 05/16/2021, 2:56 PM

## 2021-05-16 NOTE — Discharge Instructions (Signed)
[name],  You were in the hospital with a possible early small bowel obstruction. This has improved and appears resolved. Please follow-up with your PCP. Please return if you have any worsening pain, nausea or vomiting.

## 2021-05-16 NOTE — Discharge Summary (Signed)
Physician Discharge Summary  DEMORIO SEELEY SNK:[id number] DOB: [birth year] DOA: 05/15/2021  PCP: [name], MD  Admit date: 05/15/2021 Discharge date: 05/16/2021  Admitted From: Home Disposition: Home  Recommendations for Outpatient Follow-up:  Follow up with PCP in 1 week Please follow up on the following pending results: None  Home Health: None Equipment/Devices: None  Discharge Condition: Stable CODE STATUS: Full code Diet recommendation: Soft diet   Brief/Interim Summary:  Admission HPI written by Orma Flaming, MD   HPI: [name] is a 70 y.o. male with medical history significant of HTN, GERD, trigeminal neuralgia, anxiety and depression,  hx of SBO obstruction x4.  He had a [employer] Fundoplication done in 9379 and had a perforation. He got peritonitis following surgery.  He then started to have SBO about 4 years ago and has had 4 total with his last one being in 12/2020. He presented to ER with abdominal pain that started around 4:30am. He states it has progressed all day and feels like his obstructions have felt in the past. Pain is generalized and radiates into his back. He rates the pain as 10/10 but has come down to 6/10. He describes the pain as stabbing in nature. He has been nauseated, but no vomiting. He has had no diarrhea and last BM was this AM. NO blood or abnormal stool. Nothing seems to make it better or worse. He last ate last night.  Hospital course:  Small bowel obstruction Early SBO seen on CT vs ileus vs enteritis. General surgery was consulted. No NG tube was placed since patient had no vomiting. Patient was observed overnight and tolerated diet advancement. General surgery cleared for discharge home. Presumed resolved early SBO.  Primary hypertension Continue losartan-hctz  Trigeminal neuralgia Continue gabapentin  Anxiety Depression Continue Xanax  Hyponatremia Mild and resolved.  GERD Continue Dexilant  Discharge  Diagnoses:  Principal Problem:   Small bowel obstruction ([place]) Active Problems:   Essential hypertension   Trigeminal neuralgia, s/p NSU, followed by Dr. Vertell Limber   Anxiety and depression, followed by Dr. [name] in Psych   Hyponatremia   HLD (hyperlipidemia)    Discharge Instructions  Discharge Instructions     Call MD for:  persistant nausea and vomiting   Complete by: As directed    Call MD for:  severe uncontrolled pain   Complete by: As directed    Call MD for:  temperature >100.4   Complete by: As directed       Allergies as of 05/16/2021       Reactions   Dilantin [phenytoin] Other (See Comments)   bradycardia   Dilaudid [hydromorphone Hcl] Nausea And Vomiting   Sulfa Antibiotics Hives   Morphine    Ace Inhibitors Cough   Augmentin [amoxicillin-pot Clavulanate] Nausea And Vomiting   Codeine Nausea And Vomiting        Medication List     STOP taking these medications    esomeprazole 40 MG capsule Commonly known as: NEXIUM       TAKE these medications    acetaminophen 325 MG tablet Commonly known as: TYLENOL Take 2 tablets (650 mg total) by mouth every 6 (six) hours as needed for mild pain, moderate pain or fever.   Align 4 MG Caps Take 4 mg by mouth daily.   ALPRAZolam 1 MG tablet Commonly known as: Xanax Take 1 tablet (1 mg total) by mouth 4 (four) times daily as needed for anxiety. What changed: when to take this  carbamazepine 100 MG 12 hr tablet Commonly known as: TEGRETOL XR Take 100-200 mg by mouth See admin instructions. 100mg  in the morning and 200mg  at night.   Cholecalciferol 50 MCG (2000 UT) Tabs 1 tab by mouth once daily What changed:  how much to take how to take this when to take this additional instructions   cyanocobalamin 1000 MCG/ML injection Commonly known as: (VITAMIN B-12) 1 cc IM every 15 days What changed:  how much to take how to take this when to take this additional instructions   Dexilant 60 MG  capsule Generic drug: dexlansoprazole Take 1 capsule (60 mg total) by mouth daily. What changed:  when to take this reasons to take this   finasteride 5 MG tablet Commonly known as: PROSCAR Take 5 mg by mouth daily.   gabapentin 400 MG capsule Commonly known as: NEURONTIN Take 400-800 mg by mouth See admin instructions. Taking 400mg  at 0800, 1400, then 2 capsules (800mg ) at bedtime.   losartan-hydrochlorothiazide 50-12.5 MG tablet Commonly known as: HYZAAR Take 1 tablet by mouth daily.   polyethylene glycol powder 17 GM/SCOOP powder Commonly known as: GLYCOLAX/MIRALAX DISSOLVE 1 CAPFUL IN LIQUID EVERY DAY AS NEEDED FOR CONSTIPATION What changed: See the new instructions.   potassium chloride SA 20 MEQ tablet Commonly known as: KLOR-CON Take 1 tablet (20 mEq total) by mouth 2 (two) times daily. What changed:  when to take this additional instructions   sucralfate 1 g tablet Commonly known as: CARAFATE Take 1 g by mouth 4 (four) times daily -  with meals and at bedtime. What changed: Another medication with the same name was removed. Continue taking this medication, and follow the directions you see here.   tamsulosin 0.4 MG Caps capsule Commonly known as: FLOMAX Take 0.4 mg by mouth at bedtime.   triamcinolone 55 MCG/ACT Aero nasal inhaler Commonly known as: NASACORT Place 2 sprays into the nose daily. What changed:  when to take this reasons to take this   Zinc 220 (50 Zn) MG Caps Take 220 mg by mouth daily.        Follow-up Information     [name], MD. Schedule an appointment as soon as possible for a visit.   Specialties: Internal Medicine, Radiology Why: For hospital follow-up Contact information: [place] [address] [phone]                Allergies  Allergen Reactions   Dilantin [Phenytoin] Other (See Comments)    bradycardia   Dilaudid [Hydromorphone Hcl] Nausea And Vomiting   Sulfa Antibiotics Hives    Morphine    Ace Inhibitors Cough   Augmentin [Amoxicillin-Pot Clavulanate] Nausea And Vomiting   Codeine Nausea And Vomiting    Consultations: General surgery   Procedures/Studies: CT Abdomen Pelvis W Contrast  Result Date: 05/15/2021 CLINICAL DATA:  70 year old male with acute abdominal and pelvic pain. EXAM: CT ABDOMEN AND PELVIS WITH CONTRAST TECHNIQUE: Multidetector CT imaging of the abdomen and pelvis was performed using the standard protocol following bolus administration of intravenous contrast. CONTRAST:  141mL OMNIPAQUE IOHEXOL 300 MG/ML  SOLN COMPARISON:  12/24/2020 FINDINGS: Lower chest: No acute abnormality Hepatobiliary: The liver is unremarkable. The patient is status post cholecystectomy. No biliary dilatation. Pancreas: Unremarkable Spleen: Unremarkable Adrenals/Urinary Tract: The kidneys are unremarkable except for stable small low-density lesions/cysts within both kidneys. The adrenal glands are unremarkable. Circumferential bladder wall thickening is again noted. Stomach/Bowel: Mildly distended loops of small bowel are noted, primarily within the  central/left abdomen. Fluid within the remainder of the small bowel and colon noted. No discrete transition point is noted. Colonic diverticulosis noted without evidence of acute diverticulitis. Surgical changes along proximal stomach noted. Vascular/Lymphatic: Aortic atherosclerosis. No enlarged abdominal or pelvic lymph nodes. Reproductive: Marked prostate enlargement again noted. Other: No ascites, pneumoperitoneum or focal collection. Musculoskeletal: No acute or suspicious bony abnormalities are noted. IMPRESSION: 1. Mildly distended loops of small bowel primarily within the central/left abdomen with fluid within the remainder of the small bowel and colon. No discrete transition point identified. This is nonspecific but favor ileus/enteritis over low-grade obstruction. No pneumoperitoneum or abscess. 2. Marked prostate enlargement and  circumferential bladder wall thickening again noted. 3. Aortic Atherosclerosis (ICD10-I70.0). Electronically Signed   By: Margarette Canada M.D.   On: 05/15/2021 14:42      Subjective: No abdominal pain, nausea or vomiting.  Discharge Exam: Vitals:   05/16/21 0840 05/16/21 1248  BP: 135/86 129/73  Pulse: 64 (!) 58  Resp: 18 18  Temp: 97.6 F (36.4 C) 97.9 F (36.6 C)  SpO2: 97% 100%   Vitals:   05/16/21 0108 05/16/21 0513 05/16/21 0840 05/16/21 1248  BP: (!) 138/92 (!) 136/91 135/86 129/73  Pulse: 70 66 64 (!) 58  Resp:  18 18 18   Temp: 97.6 F (36.4 C) 97.9 F (36.6 C) 97.6 F (36.4 C) 97.9 F (36.6 C)  TempSrc: Oral Oral Oral Oral  SpO2: 100% 98% 97% 100%    General: Pt is alert, awake, not in acute distress Cardiovascular: RRR, S1/S2 +, no rubs, no gallops Respiratory: CTA bilaterally, no wheezing, no rhonchi Abdominal: Soft, non-distended, bowel sounds +, some mild tenderness over hernia Extremities: no edema, no cyanosis    The results of significant diagnostics from this hospitalization (including imaging, microbiology, ancillary and laboratory) are listed below for reference.     Microbiology: No results found for this or any previous visit (from the past 240 hour(s)).   Labs: BNP (last 3 results) No results for input(s): BNP in the last 8760 hours. Basic Metabolic Panel: Recent Labs  Lab 05/15/21 0943 05/16/21 0127  NA 134* 136  K 4.1 3.5  CL 98 101  CO2 27 26  GLUCOSE 99 99  BUN 9 6*  CREATININE 0.94  0.91 0.77  CALCIUM 9.5 8.5*   Liver Function Tests: Recent Labs  Lab 05/15/21 0943  AST 16  ALT 13  ALKPHOS 71  BILITOT 1.0  PROT 7.6  ALBUMIN 4.0   Recent Labs  Lab 05/15/21 0943  LIPASE 24    CBC: Recent Labs  Lab 05/15/21 0943 05/16/21 0127  WBC 10.4 7.5  HGB 17.6* 15.9  HCT 51.5 45.1  MCV 93.5 88.6  PLT 156 141*    Urinalysis    Component Value Date/Time   COLORURINE YELLOW 05/15/2021 0943   APPEARANCEUR HAZY (A)  05/15/2021 0943   LABSPEC 1.018 05/15/2021 0943   PHURINE 6.0 05/15/2021 0943   GLUCOSEU NEGATIVE 05/15/2021 0943   GLUCOSEU NEGATIVE 02/02/2021 1113   [place] 05/15/2021 0943   BILIRUBINUR NEGATIVE 05/15/2021 0943   BILIRUBINUR negative 05/11/2011 1528   [place] 05/15/2021 0943   PROTEINUR NEGATIVE 05/15/2021 0943   UROBILINOGEN 0.2 02/02/2021 1113   NITRITE NEGATIVE 05/15/2021 0943   LEUKOCYTESUR TRACE (A) 05/15/2021 0943    Time coordinating discharge: 35 minutes  SIGNED:   Cordelia Poche, MD [facility] 05/16/2021, 2:02 PM

## 2021-05-24 ENCOUNTER — Other Ambulatory Visit: Payer: Self-pay

## 2021-05-24 ENCOUNTER — Ambulatory Visit
Admission: RE | Admit: 2021-05-24 | Discharge: 2021-05-24 | Disposition: A | Discharge status: Home / Self Care | Payer: PRIVATE HEALTH INSURANCE | Source: Ambulatory Visit | Attending: Urology | Admitting: Urology

## 2021-05-24 DIAGNOSIS — N3289 Other specified disorders of bladder: Secondary | ICD-10-CM | POA: Diagnosis not present

## 2021-05-24 DIAGNOSIS — K5730 Diverticulosis of large intestine without perforation or abscess without bleeding: Secondary | ICD-10-CM | POA: Diagnosis not present

## 2021-05-24 DIAGNOSIS — R972 Elevated prostate specific antigen [PSA]: Secondary | ICD-10-CM

## 2021-05-24 MED ORDER — GADOBENATE DIMEGLUMINE 529 MG/ML IV SOLN
20.0000 mL | Freq: Once | INTRAVENOUS | Status: AC | PRN
  Administered 2021-05-24: 20 mL via INTRAVENOUS

## 2021-05-26 ENCOUNTER — Other Ambulatory Visit: Payer: Self-pay | Admitting: *Deleted

## 2021-05-26 MED ORDER — SUCRALFATE 1 G PO TABS: 1.0000 g | ORAL_TABLET | Freq: Three times a day (TID) | ORAL | 2 refills | Status: DC

## 2021-05-26 NOTE — Telephone Encounter (Signed)
Patients wife called requesting a refill for the Carafate because she said he will run out by the time he has the [place] in September.

## 2021-06-03 DIAGNOSIS — R350 Frequency of micturition: Secondary | ICD-10-CM | POA: Diagnosis not present

## 2021-06-29 DIAGNOSIS — G500 Trigeminal neuralgia: Secondary | ICD-10-CM | POA: Diagnosis not present

## 2021-06-30 ENCOUNTER — Other Ambulatory Visit: Payer: Self-pay | Admitting: Internal Medicine

## 2021-06-30 NOTE — Telephone Encounter (Signed)
Please refill as per office routine med refill policy (all routine meds refilled for 3 mo or monthly per pt preference up to one year from last visit, then month to month grace period for 3 mo, then further med refills will have to be denied)  

## 2021-07-19 ENCOUNTER — Encounter: Payer: Self-pay | Admitting: Gastroenterology

## 2021-07-19 ENCOUNTER — Ambulatory Visit: Payer: Medicare Other | Admitting: Gastroenterology

## 2021-07-19 VITALS — BP 130/72 | HR 64 | Ht 69.75 in | Wt 221.1 lb

## 2021-07-19 DIAGNOSIS — Z9889 Other specified postprocedural states: Secondary | ICD-10-CM

## 2021-07-19 DIAGNOSIS — K219 Gastro-esophageal reflux disease without esophagitis: Secondary | ICD-10-CM

## 2021-07-19 DIAGNOSIS — K449 Diaphragmatic hernia without obstruction or gangrene: Secondary | ICD-10-CM

## 2021-07-19 DIAGNOSIS — R109 Unspecified abdominal pain: Secondary | ICD-10-CM

## 2021-07-19 DIAGNOSIS — Z8719 Personal history of other diseases of the digestive system: Secondary | ICD-10-CM | POA: Diagnosis not present

## 2021-07-19 DIAGNOSIS — Z86010 Personal history of colonic polyps: Secondary | ICD-10-CM | POA: Diagnosis not present

## 2021-07-19 MED ORDER — PLENVU 140 G PO SOLR
ORAL | 0 refills | Status: DC
Start: 2021-07-19 — End: 2021-08-24

## 2021-07-19 MED ORDER — SUCRALFATE 1 G PO TABS: 1.0000 g | ORAL_TABLET | Freq: Three times a day (TID) | ORAL | 2 refills | Status: DC

## 2021-07-19 MED ORDER — DEXLANSOPRAZOLE 60 MG PO CPDR: 60.0000 mg | DELAYED_RELEASE_CAPSULE | Freq: Every day | ORAL | 11 refills | Status: DC

## 2021-07-19 NOTE — Patient Instructions (Addendum)
You have been scheduled for an endoscopy and colonoscopy. Please follow the written instructions given to you at your visit today. Please pick up your prep supplies at the pharmacy within the next 1-3 days. If you use inhalers (even only as needed), please bring them with you on the day of your procedure.   We have sent Dexilant and Plenvu to your pharmacy  Follow up in 3 months   Due to recent changes in healthcare laws, you may see the results of your imaging and laboratory studies on MyChart before your provider has had a chance to review them.  We understand that in some cases there may be results that are confusing or concerning to you. Not all laboratory results come back in the same time frame and the provider may be waiting for multiple results in order to interpret others.  Please give [place] 48 hours in order for your provider to thoroughly review all the results before contacting the office for clarification of your results.    If you are age 60 or older, your body mass index should be between 23-30. Your Body mass index is 31.96 kg/m. If this is out of the aforementioned range listed, please consider follow up with your Primary Care Provider.  If you are age 72 or younger, your body mass index should be between 19-25. Your Body mass index is 31.96 kg/m. If this is out of the aformentioned range listed, please consider follow up with your Primary Care Provider.   __________________________________________________________  [facility] providers would like to encourage you to use [facility] to communicate with providers for non-urgent requests or questions.  Due to long hold times on the telephone, sending your provider a message by [facility] may be a faster and more efficient way to get a response.  Please allow 48 business hours for a response.  Please remember that this is for non-urgent requests.    I appreciate the  opportunity to care for you  Thank You   Harl Bowie , MD

## 2021-07-19 NOTE — Progress Notes (Signed)
[name]    KU:[id number]    1951-07-26  Primary Care Physician:[name], [name], MD  Referring Physician: [name], MD [id number] [place],  [place] [id number]   Chief complaint:  GERD, generalized abdominal pain  HPI:  70 yr M with h/o chronic GERD s/p Nissen fundoplication, complicated redo with recurrent hernia and persistent GERD symptoms. He has daily reflux symptoms, somewhat better when he uses Dexilant but is not covered by insurance.  He tries to alternate Nexium with Dexilant and also has to use Carafate suspension or Mylanta as needed for breakthrough symptoms on daily basis.  He has intermittent epigastric pain and lower chest discomfort, worse on some days than others.  Is also experiencing left-sided abdominal pain and occasional right side.  No relationship to diet or bowel habits.   Nissen fundoplication in [id number] complicated by perforation at outside facility, had repair with redo with partial rewrap by Dr. Hassell Done in 1999.  CT abd & pelvis with contrast 05/15/21 1. Mildly distended loops of small bowel primarily within the central/left abdomen with fluid within the remainder of the small bowel and colon. No discrete transition point identified. This is nonspecific but favor ileus/enteritis over low-grade obstruction. No pneumoperitoneum or abscess. 2. Marked prostate enlargement and circumferential bladder wall thickening again noted. 3. Aortic Atherosclerosis (ICD10-I70.0).    History of ventral hernia repair with recurrent small bowel obstruction, most recent in February 2021.   He was hospitalized in February 2021 with small bowel obstruction.  Denies any constipation, obstipation, abdominal pain or vomiting.  His bowel habits are regular on daily MiraLAX.  No rectal bleeding or melena.   Upper GI series with small bowel follow-through February 04, 2020: Small hiatal hernia with spontaneous gastroesophageal reflux to upper  esophagus.  Mildly dilated loops of jejunum in the left abdomen with no evidence of obstruction contrast reached cecum within 45 minutes.  Rest of the study unremarkable   CT abd & pelvis with contrast 12/20/19: Multiple dilated loops of small bowel, transition zone in the anterior abd beneath areas of prior ventral hernia repair consistent with adhesions, distal small bowel is decompressed. Scattered diverticula.      EGD December 07, 2017 with a 5 cm hiatal hernia and mild gastritis otherwise unremarkable exam   Colonoscopy January 2017 with removal of multiple adenomatous polyps X3 and one tubulovillous adenoma with the largest polyp >2 cm Colonoscopy April 2018 with removal of 2 small diminutive tubular adenomas and 1 hyperplastic polyp     Outpatient Encounter Medications as of 07/19/2021  Medication Sig   acetaminophen (TYLENOL) 325 MG tablet Take 2 tablets (650 mg total) by mouth every 6 (six) hours as needed for mild pain, moderate pain or fever.   ALPRAZolam (XANAX) 1 MG tablet Take 1 tablet (1 mg total) by mouth 4 (four) times daily as needed for anxiety.   carbamazepine (TEGRETOL XR) 100 MG 12 hr tablet Take 100-200 mg by mouth See admin instructions. '100mg'$  in the morning and '200mg'$  at night.   Cholecalciferol 50 MCG (2000 UT) TABS 1 tab by mouth once daily   cyanocobalamin (,VITAMIN B-12,) 1000 MCG/ML injection 1 cc IM every 15 days   dexlansoprazole (DEXILANT) 60 MG capsule Take 1 capsule (60 mg total) by mouth daily.   finasteride (PROSCAR) 5 MG tablet Take 5 mg by mouth daily.   gabapentin (NEURONTIN) 400 MG capsule Take 400-800 mg by mouth See admin instructions.  Taking '400mg'$  at 0800, 1400, then 2 capsules ('800mg'$ ) at bedtime.   losartan-hydrochlorothiazide (HYZAAR) 50-12.5 MG tablet TAKE 1 TABLET BY MOUTH ONCE DAILY   polyethylene glycol powder (GLYCOLAX/MIRALAX) powder DISSOLVE 1 CAPFUL IN LIQUID EVERY DAY AS NEEDED FOR CONSTIPATION   potassium chloride SA (KLOR-CON) 20 MEQ  tablet Take 1 tablet (20 mEq total) by mouth 2 (two) times daily.   Probiotic Product (ALIGN) 4 MG CAPS Take 4 mg by mouth daily.   sucralfate (CARAFATE) 1 g tablet Take 1 tablet (1 g total) by mouth 4 (four) times daily -  with meals and at bedtime.   tamsulosin (FLOMAX) 0.4 MG CAPS capsule Take 0.4 mg by mouth at bedtime.   triamcinolone (NASACORT) 55 MCG/ACT AERO nasal inhaler Place 2 sprays into the nose daily.   Zinc 220 (50 Zn) MG CAPS Take 220 mg by mouth daily.   No facility-administered encounter medications on file as of 07/19/2021.    Allergies as of 07/19/2021 - Review Complete 07/19/2021  Allergen Reaction Noted   Dilantin [phenytoin] Other (See Comments) 07/27/2017   Dilaudid [hydromorphone hcl] Nausea And Vomiting 09/12/2017   Sulfa antibiotics Hives 12/17/2014   Morphine  05/05/2021   Ace inhibitors Cough 04/02/2008   Augmentin [amoxicillin-pot clavulanate] Nausea And Vomiting 07/06/2017   Codeine Nausea And Vomiting 11/10/2014    Past Medical History:  Diagnosis Date   Allergic rhinitis    Anemia, pernicious    Anxiety and depression    sees Dr. [name]   Arthritis    B12 deficiency 01/23/2020   Catheter-associated urinary tract infection ([place])    Diverticulosis of colon    Elevated PSA    and hypogonadism, sees urologist   GERD (gastroesophageal reflux disease)    hiatal hernia   Hx of blood transfusion reaction 1999   Hyperlipidemia    Hyperparathyroidism ([place])    Hyperplastic colon polyp    Hypertension    acei causes cough   IBS (irritable bowel syndrome)    Nephrolithiasis    Obstructive chronic bronchitis without exacerbation, followed by Dr. [name] 03/04/2009   ONO RA  Normal 07/28/11 6 min walk >542mno desat PFTs 08/08/11: DLCO 70% TLC 80%  FeV1 80%   Fef 25 75 56% with significant improvement after BD    [place]   Pneumonia    PONV (postoperative nausea and vomiting)    Rectal fissure    Trigeminal neuralgia     has facial asymetry   Ventral hernia     Past Surgical History:  Procedure Laterality Date   ABDOMINAL SURGERY     Multiple   BRAIN SURGERY  01/2011   Trigeminal nerve[place]   tooth extraction, root canal   ESOPHAGEAL MANOMETRY N/A 04/28/2020   Procedure: ESOPHAGEAL MANOMETRY (EM);  Surgeon: NMauri Pole MD;  Location: [facility];  Service: Endoscopy;  Laterality: N/A;   HAND SURGERY  1973   Left   HERNIA REPAIR  1999   KNEE SURGERY  1997   Left   LITHOTRIPSY     Right   NISSEN FUNDOPLICATION  [id number]   complicated by gastric perforation, peritonitis, ventral nernia    RHIZOTOMY Right 11/17/2014   Procedure: RHIZOTOMY TO RIGHT V2,V3;  Surgeon: [name] MD;  [facility];  Service: Neurosurgery;  Laterality: Right;  right   RHIZOTOMY Right 12/22/2014   Procedure: Right V2 and V3  trigeminal rhysolysis;  Surgeon: Erline Levine, MD;  Location: [facility];  Service: Neurosurgery;  Laterality: Right;  Right V2 and V3 trigeminal rhysolysis   RHIZOTOMY W/ RADIOFREQUENCY ABLATION  08/2017   Dr [name]   UPPER GASTROINTESTINAL ENDOSCOPY     VENTRAL HERNIA REPAIR  2004    Family History  Problem Relation Age of Onset   Diabetes Mother    Coronary artery disease Mother        CABG   Diabetes Sister    Diabetes Brother    Hypertension Brother    Heart disease Brother    Heart disease Brother    Colon cancer Neg Hx    Esophageal cancer Neg Hx    Stomach cancer Neg Hx     Social History   Socioeconomic History   Marital status: Married    Spouse name: Not on file   Number of children: 2   Years of education: Not on file   Highest education level: Not on file  Occupational History   Occupation: Disabled - psych  Tobacco Use   Smoking status: Former    Packs/day: 1.00    Years: 20.00    Pack years: 20.00    Types: Cigarettes    Quit date: 11/06/1996    Years since quitting: 24.7   Smokeless tobacco:  Former    Types: Chew    Quit date: 11/07/1983  Vaping Use   Vaping Use: Never used  Substance and Sexual Activity   Alcohol use: No   Drug use: No   Sexual activity: Yes    Partners: Female  Other Topics Concern   Not on file  Social History Narrative   Daily Caffeine Use:  Yes         Social Determinants of Health   Financial Resource Strain: Not on file  Food Insecurity: Not on file  Transportation Needs: Not on file  Physical Activity: Not on file  Stress: Not on file  Social Connections: Not on file  Intimate Partner Violence: Not on file      Review of systems: All other review of systems negative except as mentioned in the HPI.   Physical Exam: Vitals:   07/19/21 0903  BP: 130/72  Pulse: 64   Body mass index is 31.96 kg/m. Gen:      No acute distress HEENT:  sclera anicteric Abd:      soft, non-tender; no palpable masses, no distension Ext:    No edema Neuro: alert and oriented x 3 Psych: normal mood and affect  Data Reviewed:  Reviewed labs, radiology imaging, old records and pertinent past GI work up   Assessment and Plan/Recommendations:  70 year old male with history of GERD s/p Nissen fundoplication complicated by gastric perforation, repair with patch and redo partial wrap, ventral hernia repair with mesh, recurrent small bowel obstruction here for follow-up visit with persistent GERD symptoms, regurgitation, atypical chest/ epigastric discomfort and intermittent nausea despite high-dose PPI   Dexilant was not covered by insurance previously, he has new plan now.  Will retry sending prescription for Dexilant 60 mg daily Antireflux measures Schedule for EGD to exclude erosive esophagitis   He is not a candidate for redo surgery or repair of hiatal hernia  Left-sided abdominal pain, history of multiple advanced adenomatous colon polyps, he is due for surveillance Will proceed with colonoscopy for further evaluation The risks and benefits as  well as alternatives of endoscopic procedure(s) have been discussed and reviewed. All questions answered. The patient  agrees to proceed.    History of recurrent SBO likely secondary to adhesions, improved with conservative management Continue MiraLAX daily with goal 1-2 soft bowel movements daily   The patient was provided an opportunity to ask questions and all were answered. The patient agreed with the plan and demonstrated an understanding of the instructions.  Damaris Hippo , MD    CC: [name], MD

## 2021-07-25 DIAGNOSIS — G500 Trigeminal neuralgia: Secondary | ICD-10-CM | POA: Diagnosis not present

## 2021-07-25 DIAGNOSIS — M542 Cervicalgia: Secondary | ICD-10-CM | POA: Diagnosis not present

## 2021-07-25 DIAGNOSIS — I10 Essential (primary) hypertension: Secondary | ICD-10-CM | POA: Diagnosis not present

## 2021-08-01 ENCOUNTER — Telehealth: Payer: Self-pay

## 2021-08-01 MED ORDER — NIRMATRELVIR/RITONAVIR (PAXLOVID)TABLET: 3.0000 | ORAL_TABLET | Freq: Two times a day (BID) | ORAL | 0 refills | Status: AC

## 2021-08-01 MED ORDER — MOLNUPIRAVIR 200 MG PO CAPS: 4.0000 | ORAL_CAPSULE | Freq: Two times a day (BID) | ORAL | 0 refills | Status: AC

## 2021-08-01 NOTE — Addendum Note (Signed)
Addended by: [name] on: 08/01/2021 06:34 PM   Modules accepted: Orders

## 2021-08-01 NOTE — Telephone Encounter (Signed)
Ok done erx 

## 2021-08-01 NOTE — Telephone Encounter (Signed)
Patient COVID+ (at home test 09.26.22)  Please see message below

## 2021-08-01 NOTE — Telephone Encounter (Signed)
Please advise as the pt has stated she has been feeling to well as of 07/31/2021. Pts sxs are headache, sinus congestion, coughing and fatigue.  *Pt states he has not taken a COVID test as of yet.  **Pt would like medication sent to pharmacy if possible

## 2021-08-01 NOTE — Telephone Encounter (Signed)
Estill Bamberg Pharmacist called from [facility]  Patient was prescribed Paxlovid, but Pharmacist is recommending due to other drugs with contraindications   Lagevrio (molnupiravir)  [place], [place] Phone:  [phone]  Fax:  [phone]

## 2021-08-03 ENCOUNTER — Other Ambulatory Visit: Payer: Self-pay

## 2021-08-03 ENCOUNTER — Encounter: Payer: Self-pay | Admitting: Gastroenterology

## 2021-08-03 ENCOUNTER — Telehealth: Payer: Self-pay | Admitting: Gastroenterology

## 2021-08-03 NOTE — Telephone Encounter (Signed)
Inbound call from pt requesting a call back stating that he tested positive for COVID on 08/01/21. Pt wanted to know if he needed to keep his procedure or reschedule. Please advise. Thank you.

## 2021-08-03 NOTE — Telephone Encounter (Signed)
Poke with the patient. He has been given Paxlovid by his doctor. Moved the procedure for EGD/colon to 08/24/21. New instructions mailed to the patient.

## 2021-08-05 ENCOUNTER — Encounter: Payer: Self-pay | Admitting: Gastroenterology

## 2021-08-10 ENCOUNTER — Encounter: Payer: Medicare Other | Admitting: Gastroenterology

## 2021-08-24 ENCOUNTER — Ambulatory Visit (AMBULATORY_SURGERY_CENTER): Payer: Medicare Other | Admitting: Gastroenterology

## 2021-08-24 ENCOUNTER — Encounter: Payer: Self-pay | Admitting: Gastroenterology

## 2021-08-24 ENCOUNTER — Other Ambulatory Visit (HOSPITAL_COMMUNITY): Payer: Self-pay

## 2021-08-24 VITALS — BP 168/98 | HR 67 | Temp 96.8°F | Resp 12 | Ht 69.0 in | Wt 221.0 lb

## 2021-08-24 DIAGNOSIS — D123 Benign neoplasm of transverse colon: Secondary | ICD-10-CM | POA: Diagnosis not present

## 2021-08-24 DIAGNOSIS — K2950 Unspecified chronic gastritis without bleeding: Secondary | ICD-10-CM | POA: Diagnosis not present

## 2021-08-24 DIAGNOSIS — K219 Gastro-esophageal reflux disease without esophagitis: Secondary | ICD-10-CM

## 2021-08-24 DIAGNOSIS — R1012 Left upper quadrant pain: Secondary | ICD-10-CM | POA: Diagnosis not present

## 2021-08-24 DIAGNOSIS — K5730 Diverticulosis of large intestine without perforation or abscess without bleeding: Secondary | ICD-10-CM | POA: Diagnosis not present

## 2021-08-24 DIAGNOSIS — K621 Rectal polyp: Secondary | ICD-10-CM | POA: Diagnosis not present

## 2021-08-24 DIAGNOSIS — R1084 Generalized abdominal pain: Secondary | ICD-10-CM | POA: Diagnosis not present

## 2021-08-24 DIAGNOSIS — K5140 Inflammatory polyps of colon without complications: Secondary | ICD-10-CM

## 2021-08-24 DIAGNOSIS — R1013 Epigastric pain: Secondary | ICD-10-CM

## 2021-08-24 DIAGNOSIS — Z86010 Personal history of colonic polyps: Secondary | ICD-10-CM

## 2021-08-24 DIAGNOSIS — K648 Other hemorrhoids: Secondary | ICD-10-CM | POA: Diagnosis not present

## 2021-08-24 DIAGNOSIS — G8929 Other chronic pain: Secondary | ICD-10-CM

## 2021-08-24 DIAGNOSIS — K449 Diaphragmatic hernia without obstruction or gangrene: Secondary | ICD-10-CM | POA: Diagnosis not present

## 2021-08-24 DIAGNOSIS — K2100 Gastro-esophageal reflux disease with esophagitis, without bleeding: Secondary | ICD-10-CM

## 2021-08-24 DIAGNOSIS — K2970 Gastritis, unspecified, without bleeding: Secondary | ICD-10-CM

## 2021-08-24 DIAGNOSIS — D128 Benign neoplasm of rectum: Secondary | ICD-10-CM

## 2021-08-24 MED ORDER — DEXLANSOPRAZOLE 60 MG PO CPDR
60.0000 mg | DELAYED_RELEASE_CAPSULE | Freq: Every day | ORAL | 3 refills | Status: DC
  Filled 2021-08-24: qty 90, 90d supply, fill #0

## 2021-08-24 MED ORDER — SODIUM CHLORIDE 0.9 % IV SOLN
500.0000 mL | Freq: Once | INTRAVENOUS | Status: DC
Start: 2021-08-24 — End: 2021-08-24

## 2021-08-24 NOTE — Progress Notes (Signed)
[facility] History and Physical   Primary Care Physician:  [name], MD   Reason for Procedure:  Epigastric and left side abdominal pain, nausea, regurgitation, history of adenomatous colon polyps  Plan:    EGD and colonoscopy with possible interventions as needed     HPI: [name] is a very pleasant 70 y.o. male here for EGD and colonoscopy. Denies any vomiting, melena or bright red blood per rectum  The risks and benefits as well as alternatives of endoscopic procedure(s) have been discussed and reviewed. All questions answered. The patient agrees to proceed.  Please refer to office note 07/19/21 for additional details  Past Medical History:  Diagnosis Date   Allergic rhinitis    Anemia, pernicious    Anxiety and depression    sees Dr. [name]   Arthritis    B12 deficiency 01/23/2020   Catheter-associated urinary tract infection ([place])    Diverticulosis of colon    Elevated PSA    and hypogonadism, sees urologist   GERD (gastroesophageal reflux disease)    hiatal hernia   Hx of blood transfusion reaction 1999   Hyperlipidemia    Hyperparathyroidism ([place])    Hyperplastic colon polyp    Hypertension    acei causes cough   IBS (irritable bowel syndrome)    Nephrolithiasis    Obstructive chronic bronchitis without exacerbation, followed by Dr. [name] 03/04/2009   ONO RA  Normal 07/28/11 6 min walk >531m no desat PFTs 08/08/11: DLCO 70% TLC 80%  FeV1 80%   Fef 25 75 56% with significant improvement after BD    Peritonitis ([place])    after surgery in 1999   Pneumonia    PONV (postoperative nausea and vomiting)    Rectal fissure    Trigeminal neuralgia    has facial asymetry   Ventral hernia     Past Surgical History:  Procedure Laterality Date   ABDOMINAL SURGERY     Multiple   BRAIN SURGERY  01/2011   Trigeminal nerve/Dr Como  2019   tooth extraction, root canal   ESOPHAGEAL MANOMETRY N/A  04/28/2020   Procedure: ESOPHAGEAL MANOMETRY (EM);  Surgeon: Mauri Pole, MD;  Location: [facility];  Service: Endoscopy;  Laterality: N/A;   HAND SURGERY  1973   Left   HERNIA REPAIR  1999   KNEE SURGERY  1997   Left   LITHOTRIPSY     Right   NISSEN FUNDOPLICATION  5573    complicated by gastric perforation, peritonitis, ventral nernia    RHIZOTOMY Right 11/17/2014   Procedure: RHIZOTOMY TO RIGHT V2,V3;  Surgeon: Erline Levine, MD;  Location: [place] [facility];  Service: Neurosurgery;  Laterality: Right;  right   RHIZOTOMY Right 12/22/2014   Procedure: Right V2 and V3 trigeminal rhysolysis;  Surgeon: Erline Levine, MD;  Location: [place] [facility];  Service: Neurosurgery;  Laterality: Right;  Right V2 and V3 trigeminal rhysolysis   RHIZOTOMY W/ RADIOFREQUENCY ABLATION  08/2017   Dr [name]   UPPER GASTROINTESTINAL ENDOSCOPY     VENTRAL HERNIA REPAIR  2004    Prior to Admission medications   Medication Sig Start Date End Date Taking? Authorizing Provider  acetaminophen (TYLENOL) 325 MG tablet Take 2 tablets (650 mg total) by mouth every 6 (six) hours as needed for mild pain, moderate pain or fever. 12/26/19   Norm Parcel, PA-C  ALPRAZolam Duanne Moron) 1 MG tablet Take 1 tablet (1 mg total) by  mouth 4 (four) times daily as needed for anxiety. 05/05/21   [name], MD  carbamazepine (TEGRETOL XR) 100 MG 12 hr tablet Take 100-200 mg by mouth See admin instructions. 100mg  in the morning and 200mg  at night.    [provider]  Cholecalciferol 50 MCG (2000 UT) TABS 1 tab by mouth once daily 05/05/21   [name], MD  cyanocobalamin (,VITAMIN B-12,) 1000 MCG/ML injection 1 cc IM every 15 days 05/05/21   [name], MD  dexlansoprazole (DEXILANT) 60 MG capsule Take 1 capsule (60 mg total) by mouth daily. 01/21/20   Mauri Pole, MD  dexlansoprazole (DEXILANT) 60 MG capsule Take 1 capsule (60 mg total) by mouth daily. 07/19/21   Mauri Pole, MD  finasteride (PROSCAR) 5 MG  tablet Take 5 mg by mouth daily. 05/03/21   [provider]  gabapentin (NEURONTIN) 400 MG capsule Take 400-800 mg by mouth See admin instructions. Taking 400mg  at 0800, 1400, then 2 capsules (800mg ) at bedtime. 06/16/20   [provider]  losartan-hydrochlorothiazide (HYZAAR) 50-12.5 MG tablet TAKE 1 TABLET BY MOUTH ONCE DAILY 07/01/21   [name], MD  PEG-KCl-NaCl-NaSulf-Na Asc-C (PLENVU) 140 g SOLR 1 colon prep use as directed by GI office instructions 07/19/21   Mauri Pole, MD  polyethylene glycol powder (GLYCOLAX/MIRALAX) powder DISSOLVE 1 CAPFUL IN LIQUID EVERY DAY AS NEEDED FOR CONSTIPATION 02/18/14   Lucretia Kern, DO  potassium chloride SA (KLOR-CON) 20 MEQ tablet Take 1 tablet (20 mEq total) by mouth 2 (two) times daily. 04/16/20   Janith Lima, MD  Probiotic Product (ALIGN) 4 MG CAPS Take 4 mg by mouth daily.    [provider]  sucralfate (CARAFATE) 1 g tablet Take 1 tablet (1 g total) by mouth 4 (four) times daily -  with meals and at bedtime. 07/19/21   Mauri Pole, MD  tamsulosin (FLOMAX) 0.4 MG CAPS capsule Take 0.4 mg by mouth at bedtime. 06/21/20   [provider]  triamcinolone (NASACORT) 55 MCG/ACT AERO nasal inhaler Place 2 sprays into the nose daily. 05/06/20   [name], MD  Zinc 220 (50 Zn) MG CAPS Take 220 mg by mouth daily.    [provider]    Current Outpatient Medications  Medication Sig Dispense Refill   acetaminophen (TYLENOL) 325 MG tablet Take 2 tablets (650 mg total) by mouth every 6 (six) hours as needed for mild pain, moderate pain or fever.     ALPRAZolam (XANAX) 1 MG tablet Take 1 tablet (1 mg total) by mouth 4 (four) times daily as needed for anxiety. 120 tablet 5   carbamazepine (TEGRETOL XR) 100 MG 12 hr tablet Take 100-200 mg by mouth See admin instructions. 100mg  in the morning and 200mg  at night.     Cholecalciferol 50 MCG (2000 UT) TABS 1 tab by mouth once daily 30 tablet 99    cyanocobalamin (,VITAMIN B-12,) 1000 MCG/ML injection 1 cc IM every 15 days 6 mL 3   dexlansoprazole (DEXILANT) 60 MG capsule Take 1 capsule (60 mg total) by mouth daily. 30 capsule 0   dexlansoprazole (DEXILANT) 60 MG capsule Take 1 capsule (60 mg total) by mouth daily. 30 capsule 11   finasteride (PROSCAR) 5 MG tablet Take 5 mg by mouth daily.     gabapentin (NEURONTIN) 400 MG capsule Take 400-800 mg by mouth See admin instructions. Taking 400mg  at 0800, 1400, then 2 capsules (800mg ) at bedtime.  losartan-hydrochlorothiazide (HYZAAR) 50-12.5 MG tablet TAKE 1 TABLET BY MOUTH ONCE DAILY 90 tablet 3   PEG-KCl-NaCl-NaSulf-Na Asc-C (PLENVU) 140 g SOLR 1 colon prep use as directed by GI office instructions 1 each 0   polyethylene glycol powder (GLYCOLAX/MIRALAX) powder DISSOLVE 1 CAPFUL IN LIQUID EVERY DAY AS NEEDED FOR CONSTIPATION 527 g 2   potassium chloride SA (KLOR-CON) 20 MEQ tablet Take 1 tablet (20 mEq total) by mouth 2 (two) times daily. 180 tablet 0   Probiotic Product (ALIGN) 4 MG CAPS Take 4 mg by mouth daily.     sucralfate (CARAFATE) 1 g tablet Take 1 tablet (1 g total) by mouth 4 (four) times daily -  with meals and at bedtime. 120 tablet 2   tamsulosin (FLOMAX) 0.4 MG CAPS capsule Take 0.4 mg by mouth at bedtime.     triamcinolone (NASACORT) 55 MCG/ACT AERO nasal inhaler Place 2 sprays into the nose daily. 1 Inhaler 12   Zinc 220 (50 Zn) MG CAPS Take 220 mg by mouth daily.     Current Facility-Administered Medications  Medication Dose Route Frequency Provider Last Rate Last Admin   0.9 %  sodium chloride infusion  500 mL Intravenous Once Mauri Pole, MD        Allergies as of 08/24/2021 - Review Complete 08/05/2021  Allergen Reaction Noted   Dilantin [phenytoin] Other (See Comments) 07/27/2017   Dilaudid [hydromorphone hcl] Nausea And Vomiting 09/12/2017   Sulfa antibiotics Hives 12/17/2014   Morphine  05/05/2021   Ace inhibitors Cough 04/02/2008   Augmentin  [amoxicillin-pot clavulanate] Nausea And Vomiting 07/06/2017   Codeine Nausea And Vomiting 11/10/2014    Family History  Problem Relation Age of Onset   Diabetes Mother    Coronary artery disease Mother        CABG   Diabetes Sister    Diabetes Brother    Hypertension Brother    Heart disease Brother    Heart disease Brother    Colon cancer Neg Hx    Esophageal cancer Neg Hx    Stomach cancer Neg Hx     Social History   Socioeconomic History   Marital status: Married    Spouse name: Not on file   Number of children: 2   Years of education: Not on file   Highest education level: Not on file  Occupational History   Occupation: Disabled - psych  Tobacco Use   Smoking status: Former    Packs/day: 1.00    Years: 20.00    Pack years: 20.00    Types: Cigarettes    Quit date: 11/06/1996    Years since quitting: 24.8   Smokeless tobacco: Former    Types: Chew    Quit date: 11/07/1983  Vaping Use   Vaping Use: Never used  Substance and Sexual Activity   Alcohol use: No   Drug use: No   Sexual activity: Yes    Partners: Female  Other Topics Concern   Not on file  Social History Narrative   Daily Caffeine Use:  Yes         Social Determinants of Health   Financial Resource Strain: Not on file  Food Insecurity: Not on file  Transportation Needs: Not on file  Physical Activity: Not on file  Stress: Not on file  Social Connections: Not on file  Intimate Partner Violence: Not on file    Review of Systems:  All other review of systems negative except as mentioned in the HPI.  Physical  Exam: Vital signs in last 24 hours: BP (!) 181/110   Pulse 79   Temp (!) 96.8 F (36 C) (Skin)   Ht 5\' 9"  (1.753 m)   Wt 221 lb (100.2 kg)   SpO2 100%   BMI 32.64 kg/m     General:   Alert, NAD Lungs:  Clear .   Heart:  Regular rate and rhythm Abdomen:  Soft, nontender and nondistended. Neuro/Psych:  Alert and cooperative. Normal mood and affect. A and O x 3  Reviewed  labs, radiology imaging, old records and pertinent past GI work up  Patient is appropriate for planned procedure(s) and anesthesia in an ambulatory setting   K. Denzil Magnuson , MD [phone]

## 2021-08-24 NOTE — Op Note (Signed)
[facility] Patient Name: [name] Procedure Date: 08/24/2021 2:16 PM MRN: [id number] Endoscopist: Mauri Pole , MD Age: 70 Referring MD:  Date of Birth: 03-01-1951 Gender: Male Account #: [id number] Procedure:                Colonoscopy Indications:              High risk colon cancer surveillance: Personal                            history of colonic polyps, High risk colon cancer                            surveillance: Personal history of multiple (3 or                            more) adenomas, Incidental abdominal pain noted Medicines:                Monitored Anesthesia Care Procedure:                Pre-Anesthesia Assessment:                           - Prior to the procedure, a History and Physical                            was performed, and patient medications and                            allergies were reviewed. The patient's tolerance of                            previous anesthesia was also reviewed. The risks                            and benefits of the procedure and the sedation                            options and risks were discussed with the patient.                            All questions were answered, and informed consent                            was obtained. Prior Anticoagulants: The patient has                            taken no previous anticoagulant or antiplatelet                            agents. ASA Grade Assessment: III - A patient with                            severe systemic disease. After reviewing the risks  and benefits, the patient was deemed in                            satisfactory condition to undergo the procedure.                           After obtaining informed consent, the colonoscope                            was passed under direct vision. Throughout the                            procedure, the patient's blood pressure, pulse, and                            oxygen  saturations were monitored continuously. The                            PCF-HQ190L Colonoscope was introduced through the                            anus and advanced to the the cecum, identified by                            appendiceal orifice and ileocecal valve. The                            colonoscopy was performed without difficulty. The                            patient tolerated the procedure well. The quality                            of the bowel preparation was good. The ileocecal                            valve, appendiceal orifice, and rectum were                            photographed. Scope In: 2:44:52 PM Scope Out: 3:10:41 PM Scope Withdrawal Time: 0 hours 15 minutes 32 seconds  Total Procedure Duration: 0 hours 25 minutes 49 seconds  Findings:                 The perianal and digital rectal examinations were                            normal.                           Two sessile polyps were found in the transverse                            colon. The polyps were 4 to 10 mm in size. These  polyps were removed with a cold snare. Resection                            and retrieval were complete.                           A 8 mm polyp was found in the rectum. The polyp was                            semi-pedunculated. The polyp was removed with a hot                            snare. Resection and retrieval were complete.                           Scattered small and large-mouthed diverticula were                            found in the sigmoid colon, descending colon,                            transverse colon, ascending colon and cecum.                           Non-bleeding external and internal hemorrhoids were                            found during retroflexion. The hemorrhoids were                            medium-sized. Complications:            No immediate complications. Estimated Blood Loss:     Estimated blood loss was  minimal. Impression:               - Two 4 to 10 mm polyps in the transverse colon,                            removed with a cold snare. Resected and retrieved.                           - One 8 mm polyp in the rectum, removed with a hot                            snare. Resected and retrieved.                           - Moderate diverticulosis in the sigmoid colon, in                            the descending colon, in the transverse colon, in                            the ascending colon and in the cecum.                           -  Non-bleeding external and internal hemorrhoids. Recommendation:           - Patient has a contact number available for                            emergencies. The signs and symptoms of potential                            delayed complications were discussed with the                            patient. Return to normal activities tomorrow.                            Written discharge instructions were provided to the                            patient.                           - Resume previous diet.                           - Continue present medications.                           - Await pathology results.                           - Repeat colonoscopy in 3 - 5 years for                            surveillance based on pathology results. Mauri Pole, MD 08/24/2021 3:24:23 PM This report has been signed electronically.

## 2021-08-24 NOTE — Progress Notes (Signed)
Pt Drowsy. VSS. To PACU, report to RN. No anesthetic complications noted. Bovie pad site clear

## 2021-08-24 NOTE — Op Note (Signed)
[facility] Patient Name: [name] Procedure Date: 08/24/2021 2:16 PM MRN: [id number] Endoscopist: Mauri Pole , MD Age: 70 Referring MD:  Date of Birth: 1951-06-19 Gender: Male Account #: [id number] Procedure:                Upper GI endoscopy Indications:              Epigastric abdominal pain, Abdominal pain in the                            left upper quadrant Medicines:                Monitored Anesthesia Care Procedure:                Pre-Anesthesia Assessment:                           - Prior to the procedure, a History and Physical                            was performed, and patient medications and                            allergies were reviewed. The patient's tolerance of                            previous anesthesia was also reviewed. The risks                            and benefits of the procedure and the sedation                            options and risks were discussed with the patient.                            All questions were answered, and informed consent                            was obtained. Prior Anticoagulants: The patient has                            taken no previous anticoagulant or antiplatelet                            agents. ASA Grade Assessment: III - A patient with                            severe systemic disease. After reviewing the risks                            and benefits, the patient was deemed in                            satisfactory condition to undergo the procedure.  After obtaining informed consent, the endoscope was                            passed under direct vision. Throughout the                            procedure, the patient's blood pressure, pulse, and                            oxygen saturations were monitored continuously. The                            Endoscope was introduced through the mouth, and                            advanced to the second  part of duodenum. The upper                            GI endoscopy was accomplished without difficulty.                            The patient tolerated the procedure well. Scope In: Scope Out: Findings:                 LA Grade B (one or more mucosal breaks greater than                            5 mm, not extending between the tops of two mucosal                            folds) esophagitis with no bleeding was found 34 to                            36 cm from the incisors.                           A medium-sized hiatal hernia was present.                           Patchy moderate inflammation characterized by                            congestion (edema), erythema and friability was                            found in the entire examined stomach. Biopsies were                            taken with a cold forceps for Helicobacter pylori                            testing.  The examined duodenum was normal. Complications:            No immediate complications. Estimated Blood Loss:     Estimated blood loss was minimal. Impression:               - LA Grade B reflux esophagitis with no bleeding.                           - Medium-sized hiatal hernia.                           - Gastritis. Biopsied.                           - Normal examined duodenum. Recommendation:           - Patient has a contact number available for                            emergencies. The signs and symptoms of potential                            delayed complications were discussed with the                            patient. Return to normal activities tomorrow.                            Written discharge instructions were provided to the                            patient.                           - Resume previous diet.                           - Continue present medications.                           - Await pathology results.                           - Use Dexilant  (dexlansoprazole) 60 mg PO daily                            indefinitely. Mauri Pole, MD 08/24/2021 3:20:25 PM This report has been signed electronically.

## 2021-08-24 NOTE — Progress Notes (Signed)
Called to room to assist during endoscopic procedure.  Patient ID and intended procedure confirmed with present staff. Received instructions for my participation in the procedure from the performing physician.  

## 2021-08-24 NOTE — Patient Instructions (Signed)
Handout on polyps, diverticulosis and gastritis given.   YOU HAD AN ENDOSCOPIC PROCEDURE TODAY AT [facility]:   Refer to the procedure report that was given to you for any specific questions about what was found during the examination.  If the procedure report does not answer your questions, please call your gastroenterologist to clarify.  If you requested that your care partner not be given the details of your procedure findings, then the procedure report has been included in a sealed envelope for you to review at your convenience later.  YOU SHOULD EXPECT: Some feelings of bloating in the abdomen. Passage of more gas than usual.  Walking can help get rid of the air that was put into your GI tract during the procedure and reduce the bloating. If you had a lower endoscopy (such as a colonoscopy or flexible sigmoidoscopy) you may notice spotting of blood in your stool or on the toilet paper. If you underwent a bowel prep for your procedure, you may not have a normal bowel movement for a few days.  Please Note:  You might notice some irritation and congestion in your nose or some drainage.  This is from the oxygen used during your procedure.  There is no need for concern and it should clear up in a day or so.  SYMPTOMS TO REPORT IMMEDIATELY:  Following lower endoscopy (colonoscopy or flexible sigmoidoscopy):  Excessive amounts of blood in the stool  Significant tenderness or worsening of abdominal pains  Swelling of the abdomen that is new, acute  Fever of 100F or higher  Following upper endoscopy (EGD)  Vomiting of blood or coffee ground material  New chest pain or pain under the shoulder blades  Painful or persistently difficult swallowing  New shortness of breath  Fever of 100F or higher  Black, tarry-looking stools  For urgent or emergent issues, a gastroenterologist can be reached at any hour by calling [phone]. Do not use MyChart messaging for urgent concerns.     DIET:  We do recommend a small meal at first, but then you may proceed to your regular diet.  Drink plenty of fluids but you should avoid alcoholic beverages for 24 hours.  ACTIVITY:  You should plan to take it easy for the rest of today and you should NOT DRIVE or use heavy machinery until tomorrow (because of the sedation medicines used during the test).    FOLLOW UP: Our staff will call the number listed on your records 48-72 hours following your procedure to check on you and address any questions or concerns that you may have regarding the information given to you following your procedure. If we do not reach you, we will leave a message.  We will attempt to reach you two times.  During this call, we will ask if you have developed any symptoms of COVID 19. If you develop any symptoms (ie: fever, flu-like symptoms, shortness of breath, cough etc.) before then, please call [phone].  If you test positive for Covid 19 in the 2 weeks post procedure, please call and report this information to [place].    If any biopsies were taken you will be contacted by phone or by letter within the next 1-3 weeks.  Please call us at [phone] if you have not heard about the biopsies in 3 weeks.    SIGNATURES/CONFIDENTIALITY: You and/or your care partner have signed paperwork which will be entered into your electronic medical record.  These signatures attest to the  fact that that the information above on your After Visit Summary has been reviewed and is understood.  Full responsibility of the confidentiality of this discharge information lies with you and/or your care-partner.

## 2021-08-26 ENCOUNTER — Telehealth: Payer: Self-pay

## 2021-08-26 NOTE — Telephone Encounter (Signed)
  Follow up Call-  Call back number 08/24/2021  Post procedure Call Back phone  # [phone]  Permission to leave phone message Yes  Some recent data might be hidden     Patient questions:  Do you have a fever, pain , or abdominal swelling? No. Pain Score  0 *  Have you tolerated food without any problems? Yes.    Have you been able to return to your normal activities? Yes.    Do you have any questions about your discharge instructions: Diet   No. Medications  No. Follow up visit  No.  Do you have questions or concerns about your Care? No.  Actions: * If pain score is 4 or above: No action needed, pain <4.  Have you developed a fever since your procedure? no  2.   Have you had an respiratory symptoms (SOB or cough) since your procedure? no  3.   Have you tested positive for COVID 19 since your procedure no  4.   Have you had any family members/close contacts diagnosed with the COVID 19 since your procedure?  no   If yes to any of these questions please route to Joylene [name], RN and Joella Prince, RN

## 2021-08-30 ENCOUNTER — Telehealth: Payer: Self-pay | Admitting: Gastroenterology

## 2021-08-30 NOTE — Telephone Encounter (Signed)
Agree. thanks

## 2021-08-30 NOTE — Telephone Encounter (Signed)
Inbound call from pt's wife requesting a call back stating that the prescription Dexilant is expensive and wanted to know if it was an alternative. Please advise. Thank you.

## 2021-08-30 NOTE — Telephone Encounter (Signed)
Called pharmacy and they said that it was $100 for a 30 days supply. So Dr Silverio Decamp ok'd the 90 day of Lansoprazole to use twice a day.Durene Cal to [place] patient to inform

## 2021-08-30 NOTE — Telephone Encounter (Signed)
Dexilant copay is $100 for 30.  We checked [facility] and it was $174. They want to know if they can use Lansoprazole.  He has been on Nexium, Omeprazole and Protonix and nothing works.

## 2021-08-31 DIAGNOSIS — M542 Cervicalgia: Secondary | ICD-10-CM | POA: Diagnosis not present

## 2021-08-31 DIAGNOSIS — M25511 Pain in right shoulder: Secondary | ICD-10-CM | POA: Diagnosis not present

## 2021-09-13 ENCOUNTER — Encounter: Payer: Self-pay | Admitting: Gastroenterology

## 2021-10-06 DIAGNOSIS — G500 Trigeminal neuralgia: Secondary | ICD-10-CM | POA: Diagnosis not present

## 2021-11-04 ENCOUNTER — Ambulatory Visit: Payer: PRIVATE HEALTH INSURANCE | Admitting: Internal Medicine

## 2021-11-11 ENCOUNTER — Other Ambulatory Visit: Payer: Self-pay

## 2021-11-11 ENCOUNTER — Encounter: Payer: Self-pay | Admitting: Internal Medicine

## 2021-11-11 ENCOUNTER — Ambulatory Visit (INDEPENDENT_AMBULATORY_CARE_PROVIDER_SITE_OTHER): Payer: Medicare Other | Admitting: Internal Medicine

## 2021-11-11 VITALS — BP 134/78 | HR 62 | Temp 97.9°F | Ht 69.0 in | Wt 228.0 lb

## 2021-11-11 DIAGNOSIS — E7800 Pure hypercholesterolemia, unspecified: Secondary | ICD-10-CM | POA: Diagnosis not present

## 2021-11-11 DIAGNOSIS — E559 Vitamin D deficiency, unspecified: Secondary | ICD-10-CM | POA: Diagnosis not present

## 2021-11-11 DIAGNOSIS — Z0001 Encounter for general adult medical examination with abnormal findings: Secondary | ICD-10-CM

## 2021-11-11 DIAGNOSIS — R739 Hyperglycemia, unspecified: Secondary | ICD-10-CM

## 2021-11-11 DIAGNOSIS — I700 Atherosclerosis of aorta: Secondary | ICD-10-CM | POA: Diagnosis not present

## 2021-11-11 DIAGNOSIS — R972 Elevated prostate specific antigen [PSA]: Secondary | ICD-10-CM | POA: Diagnosis not present

## 2021-11-11 DIAGNOSIS — J309 Allergic rhinitis, unspecified: Secondary | ICD-10-CM | POA: Diagnosis not present

## 2021-11-11 DIAGNOSIS — E538 Deficiency of other specified B group vitamins: Secondary | ICD-10-CM

## 2021-11-11 DIAGNOSIS — I10 Essential (primary) hypertension: Secondary | ICD-10-CM | POA: Diagnosis not present

## 2021-11-11 LAB — BASIC METABOLIC PANEL
BUN: 10 mg/dL (ref 6–23)
CO2: 31 mEq/L (ref 19–32)
Calcium: 9.4 mg/dL (ref 8.4–10.5)
Chloride: 94 mEq/L — ABNORMAL LOW (ref 96–112)
Creatinine, Ser: 0.89 mg/dL (ref 0.40–1.50)
GFR: 87.5 mL/min (ref >60.00)
Glucose, Bld: 87 mg/dL (ref 70–99)
Potassium: 4.1 mEq/L (ref 3.5–5.1)
Sodium: 135 mEq/L (ref 135–145)

## 2021-11-11 LAB — HEPATIC FUNCTION PANEL
ALT: 10 U/L (ref 0–53)
AST: 14 U/L (ref 0–37)
Albumin: 4.2 g/dL (ref 3.5–5.2)
Alkaline Phosphatase: 81 U/L (ref 39–117)
Bilirubin, Direct: 0.1 mg/dL (ref 0.0–0.3)
Total Bilirubin: 0.7 mg/dL (ref 0.2–1.2)
Total Protein: 7 g/dL (ref 6.0–8.3)

## 2021-11-11 LAB — CBC WITH DIFFERENTIAL/PLATELET
Basophils Absolute: 0 10*3/uL (ref 0.0–0.1)
Basophils Relative: 0.4 % (ref 0.0–3.0)
Eosinophils Absolute: 0.1 10*3/uL (ref 0.0–0.7)
Eosinophils Relative: 0.7 % (ref 0.0–5.0)
HCT: 46.5 % (ref 39.0–52.0)
Hemoglobin: 15.8 g/dL (ref 13.0–17.0)
Lymphocytes Relative: 26.2 % (ref 12.0–46.0)
Lymphs Abs: 1.8 10*3/uL (ref 0.7–4.0)
MCHC: 34 g/dL (ref 30.0–36.0)
MCV: 89.9 fl (ref 78.0–100.0)
Monocytes Absolute: 0.5 10*3/uL (ref 0.1–1.0)
Monocytes Relative: 7.2 % (ref 3.0–12.0)
Neutro Abs: 4.6 10*3/uL (ref 1.4–7.7)
Neutrophils Relative %: 65.5 % (ref 43.0–77.0)
Platelets: 158 10*3/uL (ref 150.0–400.0)
RBC: 5.17 Mil/uL (ref 4.22–5.81)
RDW: 13.7 % (ref 11.5–15.5)
WBC: 7 10*3/uL (ref 4.0–10.5)

## 2021-11-11 LAB — URINALYSIS, ROUTINE W REFLEX MICROSCOPIC
Bilirubin Urine: NEGATIVE
Hgb urine dipstick: NEGATIVE
Ketones, ur: NEGATIVE
Nitrite: NEGATIVE
RBC / HPF: NONE SEEN (ref 0–?)
Specific Gravity, Urine: 1.015 (ref 1.000–1.030)
Total Protein, Urine: NEGATIVE
Urine Glucose: NEGATIVE
Urobilinogen, UA: 0.2 (ref 0.0–1.0)
pH: 7 (ref 5.0–8.0)

## 2021-11-11 LAB — LIPID PANEL
Cholesterol: 159 mg/dL (ref 0–200)
HDL: 56.9 mg/dL (ref >39.00)
LDL Cholesterol: 80 mg/dL (ref 0–99)
NonHDL: 102.11
Total CHOL/HDL Ratio: 3
Triglycerides: 111 mg/dL (ref 0.0–149.0)
VLDL: 22.2 mg/dL (ref 0.0–40.0)

## 2021-11-11 LAB — VITAMIN B12: Vitamin B-12: 605 pg/mL (ref 211–911)

## 2021-11-11 LAB — TSH: TSH: 2.06 u[IU]/mL (ref 0.35–5.50)

## 2021-11-11 LAB — HEMOGLOBIN A1C: Hgb A1c MFr Bld: 5.5 % (ref 4.6–6.5)

## 2021-11-11 LAB — VITAMIN D 25 HYDROXY (VIT D DEFICIENCY, FRACTURES): VITD: 32.86 ng/mL (ref 30.00–100.00)

## 2021-11-11 MED ORDER — METHYLPREDNISOLONE ACETATE 80 MG/ML IJ SUSP
80.0000 mg | Freq: Once | INTRAMUSCULAR | Status: AC
  Administered 2021-11-11: 80 mg via INTRAMUSCULAR

## 2021-11-11 MED ORDER — TRIAMCINOLONE ACETONIDE 55 MCG/ACT NA AERO: 2.0000 | INHALATION_SPRAY | Freq: Every day | NASAL | 12 refills | Status: DC

## 2021-11-11 MED ORDER — LOSARTAN POTASSIUM-HCTZ 50-12.5 MG PO TABS: 1.0000 | ORAL_TABLET | Freq: Every day | ORAL | 3 refills | Status: DC

## 2021-11-11 MED ORDER — ALPRAZOLAM 1 MG PO TABS: 1.0000 mg | ORAL_TABLET | Freq: Four times a day (QID) | ORAL | 5 refills | Status: DC | PRN

## 2021-11-11 NOTE — Progress Notes (Signed)
Patient ID: [name], male   DOB: 1951/03/04, 71 y.o.   MRN: [id number]         Chief Complaint:: wellness exam and Follow-up  Allergies, low vit d, elevated psa/bph, htn       HPI:  [name] is a 71 y.o. male here for wellness exam; declines covid booster, o/w up to date                        Also Does have several wks ongoing nasal allergy symptoms with clearish congestion, itch and sneezing, without fever, pain, ST, cough, swelling or wheezing.  Pt denies chest pain, increased sob or doe, wheezing, orthopnea, PND, increased LE swelling, palpitations, dizziness or syncope.  Denies urinary symptoms such as dysuria, frequency, urgency, flank pain, hematuria or n/v, fever, chills, but has f/u for elevated PSA with urology feb 2024.  Not taking Vit D.  Pt willing for cardiac CT score.   Pt denies polydipsia, polyuria, or new focal neuro s/s.   Pt denies fever, wt loss, night sweats, loss of appetite, or other constitutional symptoms  No other new complaints     Wt Readings from Last 3 Encounters:  11/11/21 228 lb (103.4 kg)  08/24/21 221 lb (100.2 kg)  07/19/21 221 lb 2 oz (100.3 kg)   BP Readings from Last 3 Encounters:  11/11/21 134/78  08/24/21 (!) 168/98  07/19/21 130/72   Immunization History  Administered Date(s) Administered   Influenza Split 09/12/2021   Influenza Whole 09/23/2007, 08/06/2010, 08/22/2011   Influenza,inj,Quad PF,6+ Mos 09/21/2015, 07/14/2016   Influenza-Unspecified 09/08/2014, 08/10/2017, 08/19/2019   Moderna Sars-Covid-2 Vaccination 03/24/2020, 04/20/2020, 10/06/2020   Pneumococcal Conjugate-13 11/12/2020   Pneumococcal Polysaccharide-23 11/07/1999, 09/14/2017, 01/24/2018   Td 05/03/2005   Tdap 01/18/2017   Zoster Recombinat (Shingrix) 06/24/2021, 09/05/2021   There are no preventive care reminders to display for this patient.     Past Medical History:  Diagnosis Date   Allergic rhinitis    Anemia, pernicious    Anxiety  and depression    sees Dr. [name]   Arthritis    B12 deficiency 01/23/2020   Catheter-associated urinary tract infection ([place])    Diverticulosis of colon    Elevated PSA    and hypogonadism, sees urologist   GERD (gastroesophageal reflux disease)    hiatal hernia   Hx of blood transfusion reaction 1999   Hyperlipidemia    Hyperparathyroidism ([place])    Hyperplastic colon polyp    Hypertension    acei causes cough   IBS (irritable bowel syndrome)    Nephrolithiasis    Obstructive chronic bronchitis without exacerbation, followed by Dr. [name] 03/04/2009   ONO RA  Normal 07/28/11 6 min walk >570m no desat PFTs 08/08/11: DLCO 70% TLC 80%  FeV1 80%   Fef 25 75 56% with significant improvement after BD    Peritonitis ([place])    after surgery in 1999   Pneumonia    PONV (postoperative nausea and vomiting)    Rectal fissure    Trigeminal neuralgia    has facial asymetry   Ventral hernia    Past Surgical History:  Procedure Laterality Date   ABDOMINAL SURGERY     Multiple   BRAIN SURGERY  01/2011   Trigeminal nerve/Dr Hatton  2019   tooth extraction, root canal   ESOPHAGEAL MANOMETRY N/A 04/28/2020   Procedure: ESOPHAGEAL MANOMETRY (EM);  Surgeon: Harl Bowie  V, MD;  Location: [facility];  Service: Endoscopy;  Laterality: N/A;   HAND SURGERY  1973   Left   HERNIA REPAIR  1999   KNEE SURGERY  1997   Left   LITHOTRIPSY     Right   NISSEN FUNDOPLICATION  6734    complicated by gastric perforation, peritonitis, ventral nernia    RHIZOTOMY Right 11/17/2014   Procedure: RHIZOTOMY TO RIGHT V2,V3;  Surgeon: Erline Levine, MD;  Location: [facility];  Service: Neurosurgery;  Laterality: Right;  right   RHIZOTOMY Right 12/22/2014   Procedure: Right V2 and V3 trigeminal rhysolysis;  Surgeon: Erline Levine, MD;  Location: [facility];  Service: Neurosurgery;  Laterality: Right;  Right V2 and V3 trigeminal rhysolysis   RHIZOTOMY W/ RADIOFREQUENCY  ABLATION  08/2017   Dr [name]   UPPER GASTROINTESTINAL ENDOSCOPY     VENTRAL HERNIA REPAIR  2004    reports that he quit smoking about 25 years ago. His smoking use included cigarettes. He has a 20.00 pack-year smoking history. He quit smokeless tobacco use about 38 years ago.  His smokeless tobacco use included chew. He reports that he does not drink alcohol and does not use drugs. family history includes Coronary artery disease in his mother; Diabetes in his brother, mother, and sister; Heart disease in his brother and brother; Hypertension in his brother. Allergies  Allergen Reactions   Dilantin [Phenytoin] Other (See Comments)    bradycardia   Dilaudid [Hydromorphone Hcl] Nausea And Vomiting   Sulfa Antibiotics Hives   Morphine    Ace Inhibitors Cough   Augmentin [Amoxicillin-Pot Clavulanate] Nausea And Vomiting   Codeine Nausea And Vomiting   Current Outpatient Medications on File Prior to Visit  Medication Sig Dispense Refill   acetaminophen (TYLENOL) 325 MG tablet Take 2 tablets (650 mg total) by mouth every 6 (six) hours as needed for mild pain, moderate pain or fever.     carbamazepine (TEGRETOL XR) 100 MG 12 hr tablet Take 100-200 mg by mouth See admin instructions. 100mg  in the morning and 200mg  at night.     Cholecalciferol 50 MCG (2000 UT) TABS 1 tab by mouth once daily 30 tablet 99   cyanocobalamin (,VITAMIN B-12,) 1000 MCG/ML injection 1 cc IM every 15 days 6 mL 3   dexlansoprazole (DEXILANT) 60 MG capsule Take 1 capsule (60 mg total) by mouth daily. 90 capsule 3   finasteride (PROSCAR) 5 MG tablet Take 5 mg by mouth daily.     gabapentin (NEURONTIN) 400 MG capsule Take 400-800 mg by mouth See admin instructions. Taking 400mg  at 0800, 1400, then 2 capsules (800mg ) at bedtime.     polyethylene glycol powder (GLYCOLAX/MIRALAX) powder DISSOLVE 1 CAPFUL IN LIQUID EVERY DAY AS NEEDED FOR CONSTIPATION 527 g 2   Probiotic Product (ALIGN) 4 MG CAPS Take 4 mg by mouth daily.      sucralfate (CARAFATE) 1 g tablet Take 1 tablet (1 g total) by mouth 4 (four) times daily -  with meals and at bedtime. 120 tablet 2   tamsulosin (FLOMAX) 0.4 MG CAPS capsule Take 0.4 mg by mouth at bedtime.     Zinc 220 (50 Zn) MG CAPS Take 220 mg by mouth daily.     potassium chloride SA (KLOR-CON) 20 MEQ tablet Take 1 tablet (20 mEq total) by mouth 2 (two) times daily. (Patient not taking: Reported on 11/11/2021) 180 tablet 0   No current facility-administered medications on file prior to visit.  ROS:  All others reviewed and negative.  Objective        PE:  BP 134/78 (BP Location: Left Arm, Patient Position: Sitting, Cuff Size: Large)    Pulse 62    Temp 97.9 F (36.6 C) (Oral)    Ht 5\' 9"  (1.753 m)    Wt 228 lb (103.4 kg)    SpO2 98%    BMI 33.67 kg/m                 Constitutional: Pt appears in NAD               HENT: Head: NCAT.                Right Ear: External ear normal.                 Left Ear: External ear normal.                Eyes: . Pupils are equal, round, and reactive to light. Conjunctivae and EOM are normal               Nose: without d/c or deformity               Neck: Neck supple. Gross normal ROM               Cardiovascular: Normal rate and regular rhythm.                 Pulmonary/Chest: Effort normal and breath sounds without rales or wheezing.                Abd:  Soft, NT, ND, + BS, no organomegaly               Neurological: Pt is alert. At baseline orientation, motor grossly intact               Skin: Skin is warm. No rashes, no other new lesions, LE edema - none               Psychiatric: Pt behavior is normal without agitation   Micro: none  Cardiac tracings I have personally interpreted today:  none  Pertinent Radiological findings (summarize): none   Lab Results  Component Value Date   WBC 7.0 11/11/2021   HGB 15.8 11/11/2021   HCT 46.5 11/11/2021   PLT 158.0 11/11/2021   GLUCOSE 87 11/11/2021   CHOL 159 11/11/2021   TRIG 111.0  11/11/2021   HDL 56.90 11/11/2021   LDLCALC 80 11/11/2021   ALT 10 11/11/2021   AST 14 11/11/2021   NA 135 11/11/2021   K 4.1 11/11/2021   CL 94 (L) 11/11/2021   CREATININE 0.89 11/11/2021   BUN 10 11/11/2021   CO2 31 11/11/2021   TSH 2.06 11/11/2021   PSA 10.82 (H) 02/02/2021   HGBA1C 5.5 11/11/2021   Assessment/Plan:  [name] is a 71 y.o. White or Caucasian [1] male with  has a past medical history of Allergic rhinitis, Anemia, pernicious, Anxiety and depression, Arthritis, B12 deficiency (01/23/2020), Catheter-associated urinary tract infection ([place]), Diverticulosis of colon, Elevated PSA, GERD (gastroesophageal reflux disease), blood transfusion reaction (1999), Hyperlipidemia, Hyperparathyroidism ([place]), Hyperplastic colon polyp, Hypertension, IBS (irritable bowel syndrome), Nephrolithiasis, Obstructive chronic bronchitis without exacerbation, followed by Dr. [name] (03/04/2009), Peritonitis ([place]), Pneumonia, PONV (postoperative nausea and vomiting), Rectal fissure, Trigeminal neuralgia, and Ventral hernia.  Vitamin D deficiency Last vitamin D Lab Results  Component Value Date   VD25OH 29.09 (  L) 02/02/2021   Low, to start oral replacement   Encounter for well adult exam with abnormal findings Age and sex appropriate education and counseling updated with regular exercise and diet Referrals for preventative services - none needed Immunizations addressed - declines covid booster Smoking counseling  - none needed Evidence for depression or other mood disorder - none significant Most recent labs reviewed. I have personally reviewed and have noted: 1) the patient's medical and social history 2) The patient's current medications and supplements 3) The patient's height, weight, and BMI have been recorded in the chart   Allergic rhinitis Moderate to severe flare, for depomedrol IM 80  Aortic atherosclerosis (HCC) Pt counsled for exercise, wt control, low chol  diet, and declines statin  B12 deficiency Lab Results  Component Value Date   VITAMINB12 605 11/11/2021   Stable, cont oral replacement - b12 1000 mcg qd   Elevated PSA Lab Results  Component Value Date   PSA 10.82 (H) 02/02/2021   PSA 12.06 (H) 01/23/2020   PSA 8.12 (H) 06/02/2019   Thought due to bph, for urology f/u feb 2024 per pt  Essential hypertension BP Readings from Last 3 Encounters:  11/11/21 134/78  08/24/21 (!) 168/98  07/19/21 130/72   Stable, pt to continue medical treatment losartan hct   HLD (hyperlipidemia) Lab Results  Component Value Date   [place] 80 11/11/2021   Uncontrolled, goal ldl < 70, pt to continue current low chol diet, declines statin, for cardiac CT score as wel   Hyperglycemia Lab Results  Component Value Date   HGBA1C 5.5 11/11/2021   Stable, pt to continue current medical treatment  - diet  Followup: Return in about 6 months (around 05/11/2022).  Cathlean Cower, MD 11/13/2021 3:37 PM [facility]

## 2021-11-11 NOTE — Patient Instructions (Addendum)
You had the steroid shot today  We have discussed the Cardiac CT Score test to measure the calcification level (if any) in your heart arteries.  This test has been ordered in our [facility], so please call [facility] directly, as they prefer this, at [phone] to be scheduled.  You are given the Mailbox note today  Please continue all other medications as before, and refills have been done if requested.  Please have the pharmacy call with any other refills you may need.  Please continue your efforts at being more active, low cholesterol diet, and weight control.  You are otherwise up to date with prevention measures today.  Please keep your appointments with your specialists as you may have planned  Please go to the LAB at the blood drawing area for the tests to be done  You will be contacted by phone if any changes need to be made immediately.  Otherwise, you will receive a letter about your results with an explanation, but please check with MyChart first.  Please remember to sign up for MyChart if you have not done so, as this will be important to you in the future with finding out test results, communicating by private email, and scheduling acute appointments online when needed.  Please make an Appointment to return in 6 months, or sooner if needed, also with Lab Appointment for testing done 3-5 days before at the [facility] (so this is for TWO appointments - please see the scheduling desk as you leave)  Due to the ongoing Covid 19 pandemic, our lab now requires an appointment for any labs done at our office.  If you need labs done and do not have an appointment, please call our office ahead of time to schedule before presenting to the lab for your testing.

## 2021-11-11 NOTE — Assessment & Plan Note (Signed)
Last vitamin D Lab Results  Component Value Date   VD25OH 29.09 (L) 02/02/2021   Low, to start oral replacement

## 2021-11-13 ENCOUNTER — Encounter: Payer: Self-pay | Admitting: Internal Medicine

## 2021-11-13 NOTE — Assessment & Plan Note (Signed)
Moderate to severe flare, for depomedrol IM 80

## 2021-11-13 NOTE — Assessment & Plan Note (Signed)
Lab Results  Component Value Date   VITAMINB12 605 11/11/2021   Stable, cont oral replacement - b12 1000 mcg qd  

## 2021-11-13 NOTE — Assessment & Plan Note (Signed)
Lab Results  Component Value Date   [place] 80 11/11/2021   Uncontrolled, goal ldl < 70, pt to continue current low chol diet, declines statin, for cardiac CT score as wel

## 2021-11-13 NOTE — Assessment & Plan Note (Signed)
Pt counsled for exercise, wt control, low chol diet, and declines statin

## 2021-11-13 NOTE — Assessment & Plan Note (Signed)
Lab Results  Component Value Date   HGBA1C 5.5 11/11/2021   Stable, pt to continue current medical treatment  - diet

## 2021-11-13 NOTE — Assessment & Plan Note (Signed)

## 2021-11-13 NOTE — Assessment & Plan Note (Signed)
Lab Results  Component Value Date   PSA 10.82 (H) 02/02/2021   PSA 12.06 (H) 01/23/2020   PSA 8.12 (H) 06/02/2019   Thought due to bph, for urology f/u feb 2024 per pt

## 2021-11-13 NOTE — Assessment & Plan Note (Signed)
BP Readings from Last 3 Encounters:  11/11/21 134/78  08/24/21 (!) 168/98  07/19/21 130/72   Stable, pt to continue medical treatment losartan hct

## 2021-11-18 ENCOUNTER — Other Ambulatory Visit: Payer: Self-pay | Admitting: Gastroenterology

## 2021-12-02 DIAGNOSIS — R350 Frequency of micturition: Secondary | ICD-10-CM | POA: Diagnosis not present

## 2021-12-15 ENCOUNTER — Ambulatory Visit (INDEPENDENT_AMBULATORY_CARE_PROVIDER_SITE_OTHER)
Admission: RE | Admit: 2021-12-15 | Discharge: 2021-12-15 | Disposition: A | Discharge status: Home / Self Care | Payer: Self-pay | Source: Ambulatory Visit | Attending: Internal Medicine | Admitting: Internal Medicine

## 2021-12-15 ENCOUNTER — Other Ambulatory Visit: Payer: Self-pay

## 2021-12-15 DIAGNOSIS — I700 Atherosclerosis of aorta: Secondary | ICD-10-CM

## 2021-12-15 DIAGNOSIS — I10 Essential (primary) hypertension: Secondary | ICD-10-CM

## 2021-12-15 DIAGNOSIS — R739 Hyperglycemia, unspecified: Secondary | ICD-10-CM

## 2021-12-27 ENCOUNTER — Other Ambulatory Visit: Payer: Self-pay | Admitting: Internal Medicine

## 2021-12-27 MED ORDER — ROSUVASTATIN CALCIUM 10 MG PO TABS: 10.0000 mg | ORAL_TABLET | Freq: Every day | ORAL | 3 refills | Status: DC

## 2021-12-27 MED ORDER — ASPIRIN 81 MG PO TBEC: 81.0000 mg | DELAYED_RELEASE_TABLET | Freq: Every day | ORAL | 12 refills | Status: DC

## 2022-02-08 DIAGNOSIS — G500 Trigeminal neuralgia: Secondary | ICD-10-CM | POA: Diagnosis not present

## 2022-02-11 ENCOUNTER — Other Ambulatory Visit: Payer: Self-pay | Admitting: Gastroenterology

## 2022-03-28 ENCOUNTER — Telehealth: Payer: Self-pay | Admitting: Internal Medicine

## 2022-03-28 NOTE — Telephone Encounter (Signed)
Pharmacy called and states that patient is not going to pick up his rosuvastatin.  Patient states that he is getting leg cramps and does not want this medication any longer.  Pharmacy just wanted Dr. [name] to be aware in case he wants to contact patient or change medication

## 2022-03-29 NOTE — Progress Notes (Signed)
Cardiology Office Note   Date:  04/06/2022   ID:  [name] [place], DOB 31-Oct-1951, MRN [id number]  PCP:  [name], MD  Cardiologist:   Jenkins Rouge, MD   No chief complaint on file.     History of Present Illness: [name] is a 71 y.o. male who presents for f/U regarding chest pain. Referred by Dr [name] 12/2018   CRF;s include HTN and HLD. He has chronic GERD post Nissen fundoplication and recurrent hiatal hernia Taking Dexilant and Carafate He gets frequent burning pain in epigastric area and chest as well as burping and nausea. Definitely GI Overtones to pain Was hospitalized with another SBO 2/18-2/21/22 with decompression NG tube spontaneous resolution   Retired from [employer] has 3 kids. Brother had stents at his age No chest pain   Calcium score done 01/02/19 was 71 below average for age 36 th percentile with LDL running in the 80 range with diet Rx  Still with some cramping in stomach Recent allergic rhinitis flare got depomedrol by Dr [name] PSA elevated over last 2 years ? BPH last 10.8 sees urology   No cardiac symptoms   Past Medical History:  Diagnosis Date   Allergic rhinitis    Anemia, pernicious    Anxiety and depression    sees Dr. [name]   Arthritis    B12 deficiency 01/23/2020   Catheter-associated urinary tract infection ([place])    Diverticulosis of colon    Elevated PSA    and hypogonadism, sees urologist   GERD (gastroesophageal reflux disease)    hiatal hernia   Hx of blood transfusion reaction 1999   Hyperlipidemia    Hyperparathyroidism ([place])    Hyperplastic colon polyp    Hypertension    acei causes cough   IBS (irritable bowel syndrome)    Nephrolithiasis    Obstructive chronic bronchitis without exacerbation, followed by Dr. [name] 03/04/2009   ONO RA  Normal 07/28/11 6 min walk >572mno desat PFTs 08/08/11: DLCO 70% TLC 80%  FeV1 80%   Fef 25 75 56% with significant improvement after BD    Peritonitis ([place]     after surgery in 1999   Pneumonia    PONV (postoperative nausea and vomiting)    Rectal fissure    Trigeminal neuralgia    has facial asymetry   Ventral hernia     Past Surgical History:  Procedure Laterality Date   ABDOMINAL SURGERY     Multiple   BRAIN SURGERY  01/2011   Trigeminal nerve/Dr SGonzales 2019   tooth extraction, root canal   ESOPHAGEAL MANOMETRY N/A 04/28/2020   Procedure: ESOPHAGEAL MANOMETRY (EM);  Surgeon: NMauri Pole MD;  [facility];  Service: Endoscopy;  Laterality: N/A;   HAND SURGERY  1973   Left   HERNIA REPAIR  1999   KNEE SURGERY  1997   Left   LITHOTRIPSY     Right   NISSEN FUNDOPLICATION  18841   complicated by gastric perforation, peritonitis, ventral nernia    RHIZOTOMY Right 11/17/2014   Procedure: RHIZOTOMY TO RIGHT V2,V3;  Surgeon: [name] MD;  [facility];  Service: Neurosurgery;  Laterality: Right;  right   RHIZOTOMY Right 12/22/2014   Procedure: Right V2 and V3 trigeminal rhysolysis;  Surgeon: [name] MD;  [facility];  Service: Neurosurgery;  Laterality: Right;  Right V2 and V3 trigeminal rhysolysis  RHIZOTOMY W/ RADIOFREQUENCY ABLATION  08/2017   Dr [name]   UPPER GASTROINTESTINAL ENDOSCOPY     VENTRAL HERNIA REPAIR  2004     Current Outpatient Medications  Medication Sig Dispense Refill   acetaminophen (TYLENOL) 325 MG tablet Take 2 tablets (650 mg total) by mouth every 6 (six) hours as needed for mild pain, moderate pain or fever.     ALPRAZolam (XANAX) 1 MG tablet Take 1 tablet (1 mg total) by mouth 4 (four) times daily as needed for anxiety. 120 tablet 5   aspirin 81 MG EC tablet Take 1 tablet (81 mg total) by mouth daily. Swallow whole. 30 tablet 12   carbamazepine (TEGRETOL XR) 100 MG 12 hr tablet Take 100-200 mg by mouth See admin instructions. '100mg'$  in the morning and '200mg'$  at night.     Cholecalciferol 50 MCG (2000 UT) TABS 1 tab by  mouth once daily 30 tablet 99   cyanocobalamin (,VITAMIN B-12,) 1000 MCG/ML injection 1 cc IM every 15 days 6 mL 3   finasteride (PROSCAR) 5 MG tablet Take 5 mg by mouth daily.     gabapentin (NEURONTIN) 400 MG capsule Take 400-800 mg by mouth See admin instructions. Taking '400mg'$  at 0800, 1400, then 2 capsules ('800mg'$ ) at bedtime.     lansoprazole (PREVACID) 30 MG capsule TAKE ONE CAPSULE BY MOUTH TWICE DAILY 180 capsule 1   losartan-hydrochlorothiazide (HYZAAR) 50-12.5 MG tablet Take 1 tablet by mouth daily. 90 tablet 3   polyethylene glycol powder (GLYCOLAX/MIRALAX) powder DISSOLVE 1 CAPFUL IN LIQUID EVERY DAY AS NEEDED FOR CONSTIPATION 527 g 2   Probiotic Product (ALIGN) 4 MG CAPS Take 4 mg by mouth daily.     sucralfate (CARAFATE) 1 g tablet Take 1 tablet (1 g total) by mouth 4 (four) times daily -  with meals and at bedtime. 120 tablet 2   tamsulosin (FLOMAX) 0.4 MG CAPS capsule Take 0.4 mg by mouth at bedtime.     triamcinolone (NASACORT) 55 MCG/ACT AERO nasal inhaler Place 2 sprays into the nose daily. 1 each 12   potassium chloride SA (KLOR-CON) 20 MEQ tablet Take 1 tablet (20 mEq total) by mouth 2 (two) times daily. (Patient not taking: Reported on 04/06/2022) 180 tablet 0   rosuvastatin (CRESTOR) 10 MG tablet Take 1 tablet (10 mg total) by mouth daily. (Patient not taking: Reported on 04/06/2022) 90 tablet 3   Zinc 220 (50 Zn) MG CAPS Take 220 mg by mouth daily. (Patient not taking: Reported on 04/06/2022)     No current facility-administered medications for this visit.    Allergies:   Dilantin [phenytoin], Dilaudid [hydromorphone hcl], Sulfa antibiotics, Morphine, Ace inhibitors, Augmentin [amoxicillin-pot clavulanate], and Codeine    Social History:  The patient  reports that he quit smoking about 25 years ago. His smoking use included cigarettes. He has a 20.00 pack-year smoking history. He quit smokeless tobacco use about 38 years ago.  His smokeless tobacco use included chew. He reports  that he does not drink alcohol and does not use drugs.   Family History:  The patient's family history includes Coronary artery disease in his mother; Diabetes in his brother, mother, and sister; Heart disease in his brother and brother; Hypertension in his brother.    ROS:  Please see the history of present illness.   Otherwise, review of systems are positive for none.   All other systems are reviewed and negative.    PHYSICAL EXAM: VS:  BP 124/72   Pulse 67  Ht '5\' 11"'$  (1.803 m)   Wt 230 lb (104.3 kg)   SpO2 97%   BMI 32.08 kg/m  , BMI Body mass index is 32.08 kg/m. Affect appropriate Healthy:  appears stated age 33: normal Neck supple with no adenopathy JVP normal no bruits no thyromegaly Lungs clear with no wheezing and good diaphragmatic motion Heart:  S1/S2 no murmur, no rub, gallop or click PMI normal Abdomen: benighn, multiple previous surgeries  no bruit.  No HSM or HJR Distal pulses intact with no bruits No edema Neuro non-focal Skin warm and dry No muscular weakness    EKG:  04/06/2022 NSR normal ECG rate 67    Recent Labs: 11/11/2021: ALT 10; BUN 10; Creatinine, Ser 0.89; Hemoglobin 15.8; Platelets 158.0; Potassium 4.1; Sodium 135; TSH 2.06    Lipid Panel    Component Value Date/Time   CHOL 159 11/11/2021 1006   TRIG 111.0 11/11/2021 1006   HDL 56.90 11/11/2021 1006   CHOLHDL 3 11/11/2021 1006   VLDL 22.2 11/11/2021 1006   LDLCALC 80 11/11/2021 1006      Wt Readings from Last 3 Encounters:  04/06/22 230 lb (104.3 kg)  11/11/21 228 lb (103.4 kg)  08/24/21 221 lb (100.2 kg)      Other studies Reviewed: Additional studies/ records that were reviewed today include: Notes from primary Notes from GI , ECG.    ASSESSMENT AND PLAN:  1.  Chest Pain:  Atypical no need for ETT normal ECG Family history CAD with calcium score 71 / 17 th percentile continue risk factor modification  2.  GI: Hospitalized for SBO 2/18-2/22/ 22 slipped Nissen  fundoplication and multiple previous attempts at ventral hernia repair F/U Dr Hassell Done   3.  HTN:  Well controlled.  Continue current medications and low sodium Dash type diet.   4.  HLD:  LDL 87 continue diet Rx  As calcium score is below average for age  24. COVID: mild resolved  Now vaccinated  6. Leg Pain: good pulses on exam no edema to suggest DVT Duplex 02/05/20 with no DVT f/u primary    Current medicines are reviewed at length with the patient today.  The patient does not have concerns regarding medicines.  The following changes have been made:  no change  Labs/ tests ordered today include: None   No orders of the defined types were placed in this encounter.    Disposition:   FU with cardiology  In a year     Signed, Jenkins Rouge, MD  04/06/2022 3:02 PM    [facility] [address], [place], [place]  [address] Phone: [phone]; Fax: [phone]

## 2022-04-06 ENCOUNTER — Ambulatory Visit: Payer: Medicare Other | Admitting: Cardiovascular Disease

## 2022-04-06 ENCOUNTER — Encounter: Payer: Self-pay | Admitting: Cardiovascular Disease

## 2022-04-06 VITALS — BP 124/72 | HR 67 | Ht 71.0 in | Wt 230.0 lb

## 2022-04-06 DIAGNOSIS — R079 Chest pain, unspecified: Secondary | ICD-10-CM

## 2022-04-06 DIAGNOSIS — I10 Essential (primary) hypertension: Secondary | ICD-10-CM | POA: Diagnosis not present

## 2022-04-06 DIAGNOSIS — I2510 Atherosclerotic heart disease of native coronary artery without angina pectoris: Secondary | ICD-10-CM

## 2022-04-06 NOTE — Patient Instructions (Signed)

## 2022-04-27 ENCOUNTER — Ambulatory Visit: Payer: Medicare Other | Admitting: Gastroenterology

## 2022-04-27 ENCOUNTER — Encounter: Payer: Self-pay | Admitting: Gastroenterology

## 2022-04-27 VITALS — BP 132/80 | HR 64 | Ht 71.0 in | Wt 229.0 lb

## 2022-04-27 DIAGNOSIS — R109 Unspecified abdominal pain: Secondary | ICD-10-CM

## 2022-04-27 DIAGNOSIS — Z8719 Personal history of other diseases of the digestive system: Secondary | ICD-10-CM | POA: Diagnosis not present

## 2022-04-27 MED ORDER — METRONIDAZOLE 500 MG PO TABS: 500.0000 mg | ORAL_TABLET | Freq: Three times a day (TID) | ORAL | 0 refills | Status: AC

## 2022-04-27 MED ORDER — CIPROFLOXACIN HCL 500 MG PO TABS: 500.0000 mg | ORAL_TABLET | Freq: Two times a day (BID) | ORAL | 0 refills | Status: AC

## 2022-04-27 NOTE — Patient Instructions (Addendum)
If you are age 70 or older, your body mass index should be between 23-30. Your Body mass index is 31.94 kg/m. If this is out of the aforementioned range listed, please consider follow up with your Primary Care Provider. ________________________________________________________  The [place] [facility] providers would like to encourage you to use [facility] to communicate with providers for non-urgent requests or questions.  Due to long hold times on the telephone, sending your provider a message by [facility] may be a faster and more efficient way to get a response.  Please allow 48 business hours for a response.  Please remember that this is for non-urgent requests.  _______________________________________________________  We have sent the following medications to your pharmacy for you to pick up at your convenience:  START: Ciprofloxacin '500mg'$  one tablet two times daily for 10 days  START: Flagyl '500mg'$  one tablet three times daily for 10 days  Please call our office at [phone] or send a [place] with an update on how you are feeling after you finish taking antibiotics.  Thank you for entrusting me with your care and choosing [facility].  Alonza Bogus, PA-C

## 2022-05-12 ENCOUNTER — Ambulatory Visit (INDEPENDENT_AMBULATORY_CARE_PROVIDER_SITE_OTHER): Payer: Medicare Other | Admitting: Internal Medicine

## 2022-05-12 ENCOUNTER — Encounter: Payer: Self-pay | Admitting: Internal Medicine

## 2022-05-12 VITALS — BP 138/80 | HR 71 | Temp 97.7°F | Ht 71.0 in | Wt 230.0 lb

## 2022-05-12 DIAGNOSIS — E7800 Pure hypercholesterolemia, unspecified: Secondary | ICD-10-CM | POA: Diagnosis not present

## 2022-05-12 DIAGNOSIS — R739 Hyperglycemia, unspecified: Secondary | ICD-10-CM | POA: Diagnosis not present

## 2022-05-12 DIAGNOSIS — I10 Essential (primary) hypertension: Secondary | ICD-10-CM

## 2022-05-12 DIAGNOSIS — E559 Vitamin D deficiency, unspecified: Secondary | ICD-10-CM

## 2022-05-12 DIAGNOSIS — Z125 Encounter for screening for malignant neoplasm of prostate: Secondary | ICD-10-CM

## 2022-05-12 DIAGNOSIS — E538 Deficiency of other specified B group vitamins: Secondary | ICD-10-CM

## 2022-05-12 DIAGNOSIS — J309 Allergic rhinitis, unspecified: Secondary | ICD-10-CM | POA: Diagnosis not present

## 2022-05-12 MED ORDER — LOSARTAN POTASSIUM-HCTZ 50-12.5 MG PO TABS: 1.0000 | ORAL_TABLET | Freq: Every day | ORAL | 3 refills | Status: DC

## 2022-05-12 MED ORDER — POTASSIUM CHLORIDE CRYS ER 20 MEQ PO TBCR: 20.0000 meq | EXTENDED_RELEASE_TABLET | Freq: Two times a day (BID) | ORAL | 3 refills | Status: DC

## 2022-05-12 MED ORDER — METHYLPREDNISOLONE ACETATE 40 MG/ML IJ SUSP
80.0000 mg | Freq: Once | INTRAMUSCULAR | Status: AC
  Administered 2022-05-12: 80 mg via INTRAMUSCULAR

## 2022-05-12 MED ORDER — METHYLPREDNISOLONE ACETATE 80 MG/ML IJ SUSP: 80.0000 mg | Freq: Once | INTRAMUSCULAR | Status: DC

## 2022-05-12 MED ORDER — ALPRAZOLAM 1 MG PO TABS
1.0000 mg | ORAL_TABLET | Freq: Four times a day (QID) | ORAL | 5 refills | Status: DC | PRN
Start: 2022-05-12 — End: 2022-11-05

## 2022-05-12 MED ORDER — TRIAMCINOLONE ACETONIDE 55 MCG/ACT NA AERO: 2.0000 | INHALATION_SPRAY | Freq: Every day | NASAL | 12 refills | Status: DC

## 2022-05-12 MED ORDER — PREDNISONE 10 MG PO TABS: ORAL_TABLET | ORAL | 0 refills | Status: DC

## 2022-05-12 NOTE — Assessment & Plan Note (Signed)
Mild to mod, for depomedrol IM 80 mg qd, prednisone taper,  to f/u any worsening symptoms or concerns

## 2022-05-12 NOTE — Assessment & Plan Note (Signed)
Lab Results  Component Value Date   HGBA1C 5.5 11/11/2021   Stable, pt to continue current medical treatment  - diet, wt control, excercise

## 2022-05-12 NOTE — Progress Notes (Signed)
Patient ID: [name], male   DOB: Sep 10, 1951, 70 y.o.   MRN: [id number]        Chief Complaint: follow up HTN, HLD and hyperglycemiam allergies       HPI:  [name] is a 71 y.o. male Does have several wks ongoing nasal allergy symptoms with clearish congestion, itch and sneezing, without fever, pain, ST, cough, swelling or wheezing.  Pt denies other chest pain, increased sob or doe, orthopnea, PND, increased LE swelling, palpitations, dizziness or syncope.   Pt denies polydipsia, polyuria, or new focal neuro s/s.    Pt denies fever, wt loss, night sweats, loss of appetite, or other constitutional symptoms         Wt Readings from Last 3 Encounters:  05/12/22 230 lb (104.3 kg)  04/27/22 229 lb (103.9 kg)  04/06/22 230 lb (104.3 kg)   BP Readings from Last 3 Encounters:  05/12/22 138/80  04/27/22 132/80  04/06/22 124/72         Past Medical History:  Diagnosis Date   Allergic rhinitis    Anemia, pernicious    Anxiety and depression    sees Dr. [name]   Arthritis    B12 deficiency 01/23/2020   Catheter-associated urinary tract infection ([place])    Diverticulosis of colon    Elevated PSA    and hypogonadism, sees urologist   GERD (gastroesophageal reflux disease)    hiatal hernia   Hx of blood transfusion reaction 1999   Hyperlipidemia    Hyperparathyroidism ([place])    Hyperplastic colon polyp    Hypertension    acei causes cough   IBS (irritable bowel syndrome)    Nephrolithiasis    Obstructive chronic bronchitis without exacerbation, followed by Dr. [name] 03/04/2009   ONO RA  Normal 07/28/11 6 min walk >526mno desat PFTs 08/08/11: DLCO 70% TLC 80%  FeV1 80%   Fef 25 75 56% with significant improvement after BD    Peritonitis ([place]    after surgery in 1999   Pneumonia    PONV (postoperative nausea and vomiting)    Rectal fissure    Trigeminal neuralgia    has facial asymetry   Ventral hernia    Past Surgical History:  Procedure Laterality Date    ABDOMINAL SURGERY     Multiple   BRAIN SURGERY  01/2011   Trigeminal nerve/Dr [place] 2019   tooth extraction, root canal   ESOPHAGEAL MANOMETRY N/A 04/28/2020   Procedure: ESOPHAGEAL MANOMETRY (EM);  Surgeon: NMauri Pole MD;  Location: [facility];  Service: Endoscopy;  Laterality: N/A;   HAND SURGERY  1973   Left   HERNIA REPAIR  1999   KNEE SURGERY  1997   Left   LITHOTRIPSY     Right   NISSEN FUNDOPLICATION  10093   complicated by gastric perforation, peritonitis, ventral nernia    RHIZOTOMY Right 11/17/2014   Procedure: RHIZOTOMY TO RIGHT V2,V3;  Surgeon: [name] MD;  [facility];  Service: Neurosurgery;  Laterality: Right;  right   RHIZOTOMY Right 12/22/2014   Procedure: Right V2 and V3 trigeminal rhysolysis;  Surgeon: [name] MD;  [facility];  Service: Neurosurgery;  Laterality: Right;  Right V2 and V3 trigeminal rhysolysis   RHIZOTOMY W/ RADIOFREQUENCY ABLATION  08/2017   Dr HMaryjean Ka  [facility]STROINTESTIN[facility] ENDOSCOPY     VENTRAL HERNIA REPAIR  2004    reports that  he quit smoking about 25 years ago. His smoking use included cigarettes. He has a 20.00 pack-year smoking history. He quit smokeless tobacco use about 38 years ago.  His smokeless tobacco use included chew. He reports that he does not drink alcohol and does not use drugs. family history includes Colon polyps in his mother; Coronary artery disease in his mother; Diabetes in his brother, mother, and sister; Heart disease in his brother and brother; Hypertension in his brother. Allergies  Allergen Reactions   Dilantin [Phenytoin] Other (See Comments)    bradycardia   Dilaudid [Hydromorphone Hcl] Nausea And Vomiting   Sulfa Antibiotics Hives   Morphine    Ace Inhibitors Cough   Augmentin [Amoxicillin-Pot Clavulanate] Nausea And Vomiting   Codeine Nausea And Vomiting   Current Outpatient Medications on File Prior to Visit   Medication Sig Dispense Refill   acetaminophen (TYLENOL) 325 MG tablet Take 2 tablets (650 mg total) by mouth every 6 (six) hours as needed for mild pain, moderate pain or fever.     carbamazepine (TEGRETOL XR) 100 MG 12 hr tablet Take 100-200 mg by mouth See admin instructions. '100mg'$  in the morning and '200mg'$  at night.     Cholecalciferol 50 MCG (2000 UT) TABS 1 tab by mouth once daily 30 tablet 99   cyanocobalamin (,VITAMIN B-12,) 1000 MCG/ML injection 1 cc IM every 15 days 6 mL 3   cyclobenzaprine (FLEXERIL) 10 MG tablet Take 10 mg by mouth 2 (two) times daily.     finasteride (PROSCAR) 5 MG tablet Take 5 mg by mouth daily.     gabapentin (NEURONTIN) 400 MG capsule Take 400-800 mg by mouth See admin instructions. Taking '400mg'$  at 0800, 1400, then 2 capsules ('800mg'$ ) at bedtime.     lansoprazole (PREVACID) 30 MG capsule TAKE ONE CAPSULE BY MOUTH TWICE DAILY 180 capsule 1   polyethylene glycol powder (GLYCOLAX/MIRALAX) powder DISSOLVE 1 CAPFUL IN LIQUID EVERY DAY AS NEEDED FOR CONSTIPATION 527 g 2   Probiotic Product (ALIGN) 4 MG CAPS Take 4 mg by mouth daily.     sucralfate (CARAFATE) 1 g tablet Take 1 tablet (1 g total) by mouth 4 (four) times daily -  with meals and at bedtime. 120 tablet 2   tamsulosin (FLOMAX) 0.4 MG CAPS capsule Take 0.4 mg by mouth at bedtime.     Zinc 220 (50 Zn) MG CAPS Take 220 mg by mouth daily.     No current facility-administered medications on file prior to visit.        ROS:  All others reviewed and negative.  Objective        PE:  BP 138/80 (BP Location: Right Arm, Patient Position: Sitting, Cuff Size: Large)   Pulse 71   Temp 97.7 F (36.5 C) (Oral)   Ht '5\' 11"'$  (1.803 m)   Wt 230 lb (104.3 kg)   SpO2 97%   BMI 32.08 kg/m                 Constitutional: Pt appears in NAD               HENT: Head: NCAT.                Right Ear: External ear normal.                 Left Ear: External ear normal. Bilat tm's with mild erythema.  Max sinus areas non  tender.  Pharynx with mild erythema, no exudate  Eyes: . Pupils are equal, round, and reactive to light. Conjunctivae and EOM are normal               Nose: without d/c or deformity               Neck: Neck supple. Gross normal ROM               Cardiovascular: Normal rate and regular rhythm.                 Pulmonary/Chest: Effort normal and breath sounds without rales or wheezing.                Abd:  Soft, NT, ND, + BS, no organomegaly               Neurological: Pt is alert. At baseline orientation, motor grossly intact               Skin: Skin is warm. No rashes, no other new lesions, LE edema - none               Psychiatric: Pt behavior is normal without agitation   Micro: none  Cardiac tracings I have personally interpreted today:  none  Pertinent Radiological findings (summarize): none   Lab Results  Component Value Date   WBC 7.0 11/11/2021   HGB 15.8 11/11/2021   HCT 46.5 11/11/2021   PLT 158.0 11/11/2021   GLUCOSE 87 11/11/2021   CHOL 159 11/11/2021   TRIG 111.0 11/11/2021   HDL 56.90 11/11/2021   LDLCALC 80 11/11/2021   ALT 10 11/11/2021   AST 14 11/11/2021   NA 135 11/11/2021   K 4.1 11/11/2021   CL 94 (L) 11/11/2021   CREATININE 0.89 11/11/2021   BUN 10 11/11/2021   CO2 31 11/11/2021   TSH 2.06 11/11/2021   PSA 10.82 (H) 02/02/2021   HGBA1C 5.5 11/11/2021   Assessment/Plan:  [name] is a 71 y.o. White or Caucasian [1] male with  has a past medical history of Allergic rhinitis, Anemia, pernicious, Anxiety and depression, Arthritis, B12 deficiency (01/23/2020), Catheter-associated urinary tract infection ([place]), Diverticulosis of colon, Elevated PSA, GERD (gastroesophageal reflux disease), blood transfusion reaction (1999), Hyperlipidemia, Hyperparathyroidism ([place]), Hyperplastic colon polyp, Hypertension, IBS (irritable bowel syndrome), Nephrolithiasis, Obstructive chronic bronchitis without exacerbation, followed by Dr. [name]  (03/04/2009), Peritonitis ([place]), Pneumonia, PONV (postoperative nausea and vomiting), Rectal fissure, Trigeminal neuralgia, and Ventral hernia.  Vitamin D deficiency Last vitamin D Lab Results  Component Value Date   VD25OH 32.86 11/11/2021   Low to start oral replacement   Hyperglycemia Lab Results  Component Value Date   HGBA1C 5.5 11/11/2021   Stable, pt to continue current medical treatment  - diet, wt control, excercise   HLD (hyperlipidemia) Lab Results  Component Value Date   LDLCALC 80 11/11/2021   Stable, pt to continue current low chol diet   Essential hypertension BP Readings from Last 3 Encounters:  05/12/22 138/80  04/27/22 132/80  04/06/22 124/72   Stable, pt to continue medical treatment hyzaar 50 12.5 mg qd   Allergic rhinitis Mild to mod, for depomedrol IM 80 mg qd, prednisone taper,  to f/u any worsening symptoms or concerns  Followup: Return in about 6 months (around 11/12/2022).  Cathlean Cower, MD 05/12/2022 9:02 PM [facility]

## 2022-05-12 NOTE — Assessment & Plan Note (Signed)
Lab Results  Component Value Date   LDLCALC 80 11/11/2021   Stable, pt to continue current low chol diet

## 2022-05-12 NOTE — Assessment & Plan Note (Signed)
Last vitamin D Lab Results  Component Value Date   VD25OH 32.86 11/11/2021   Low to start oral replacement

## 2022-05-12 NOTE — Patient Instructions (Signed)
You had the steroid shot today  Please take all new medication as prescribed - the prednisone  Please continue all other medications as before, and refills have been done if requested.  Please have the pharmacy call with any other refills you may need.  Please continue your efforts at being more active, low cholesterol diet, and weight control.  Please keep your appointments with your specialists as you may have planned  No lab work needed today  Please make an Appointment to return in 6 months, or sooner if needed, also with Lab Appointment for testing done 3-5 days before at the [facility] (so this is for TWO appointments - please see the scheduling desk as you leave)

## 2022-05-12 NOTE — Assessment & Plan Note (Signed)
BP Readings from Last 3 Encounters:  05/12/22 138/80  04/27/22 132/80  04/06/22 124/72   Stable, pt to continue medical treatment hyzaar 50 12.5 mg qd

## 2022-05-19 ENCOUNTER — Other Ambulatory Visit: Payer: Self-pay | Admitting: Internal Medicine

## 2022-06-01 DIAGNOSIS — R350 Frequency of micturition: Secondary | ICD-10-CM | POA: Diagnosis not present

## 2022-06-05 DIAGNOSIS — G500 Trigeminal neuralgia: Secondary | ICD-10-CM | POA: Diagnosis not present

## 2022-07-05 ENCOUNTER — Ambulatory Visit (INDEPENDENT_AMBULATORY_CARE_PROVIDER_SITE_OTHER): Payer: Medicare Other

## 2022-07-05 ENCOUNTER — Telehealth: Payer: Self-pay

## 2022-07-05 VITALS — Ht 71.0 in | Wt 230.0 lb

## 2022-07-05 DIAGNOSIS — Z0000 Encounter for general adult medical examination without abnormal findings: Secondary | ICD-10-CM | POA: Diagnosis not present

## 2022-07-05 NOTE — Progress Notes (Signed)
Subjective:   [name] is a 71 y.o. male who presents for Medicare Annual/Subsequent preventive examination.  Review of Systems    Virtual Visit via Telephone Note  I connected with  [name] on 07/05/22 at  1:30 PM EDT by telephone and verified that I am speaking with the correct person using two identifiers.  Location: Patient: Home  Provider: [place] Persons participating in the virtual visit: [place]   I discussed the limitations, risks, security and privacy concerns of performing an evaluation and management service by telephone and the availability of in person appointments. The patient expressed understanding and agreed to proceed.  Interactive audio and video telecommunications were attempted between this nurse and patient, however failed, due to patient having technical difficulties OR patient did not have access to video capability.  We continued and completed visit with audio only.  Some vital signs may be absent or patient reported.   Sheral Flow, LPN  Cardiac Risk Factors include: advanced age (>22mn, >>106women);dyslipidemia;hypertension;male gender;obesity (BMI >30kg/m2);sedentary lifestyle;family history of premature cardiovascular disease     Objective:    Today's Vitals   07/05/22 1341 07/05/22 1342  Weight: 230 lb (104.3 kg)   Height: '5\' 11"'$  (1.803 m)   PainSc: 4  4   PainLoc: Back    Body mass index is 32.08 kg/m.     07/05/2022    1:45 PM 12/24/2020    3:56 PM 12/07/2017    1:50 PM 07/27/2017   10:11 PM 07/21/2017   10:35 AM 03/05/2017    1:32 PM 01/18/2017    8:41 AM  Advanced Directives  Does Patient Have a Medical Advance Directive? No No No No No No No  Would patient like information on creating a medical advance directive? No - Patient declined No - Patient declined         Current Medications (verified) Outpatient Encounter Medications as of 07/05/2022  Medication Sig    acetaminophen (TYLENOL) 325 MG tablet Take 2 tablets (650 mg total) by mouth every 6 (six) hours as needed for mild pain, moderate pain or fever.   ALPRAZolam (XANAX) 1 MG tablet Take 1 tablet (1 mg total) by mouth 4 (four) times daily as needed for anxiety.   carbamazepine (TEGRETOL XR) 100 MG 12 hr tablet Take 100-200 mg by mouth See admin instructions. '100mg'$  in the morning and '200mg'$  at night.   Cholecalciferol 50 MCG (2000 UT) TABS 1 tab by mouth once daily   cyanocobalamin (,VITAMIN B-12,) 1000 MCG/ML injection INJECT ONE ML in every 15 DAYS   cyclobenzaprine (FLEXERIL) 10 MG tablet Take 10 mg by mouth 2 (two) times daily.   finasteride (PROSCAR) 5 MG tablet Take 5 mg by mouth daily.   gabapentin (NEURONTIN) 400 MG capsule Take 400-800 mg by mouth See admin instructions. Taking '400mg'$  at 0800, 1400, then 2 capsules ('800mg'$ ) at bedtime.   lansoprazole (PREVACID) 30 MG capsule TAKE ONE CAPSULE BY MOUTH TWICE DAILY   losartan-hydrochlorothiazide (HYZAAR) 50-12.5 MG tablet Take 1 tablet by mouth daily.   polyethylene glycol powder (GLYCOLAX/MIRALAX) powder DISSOLVE 1 CAPFUL IN LIQUID EVERY DAY AS NEEDED FOR CONSTIPATION   potassium chloride SA (KLOR-CON M) 20 MEQ tablet Take 1 tablet (20 mEq total) by mouth 2 (two) times daily.   predniSONE (DELTASONE) 10 MG tablet 3 tabs by mouth per day for 3 days,2tabs per day for 3 days,1tab per day for 3 days   Probiotic Product (ALIGN) 4 MG CAPS Take  4 mg by mouth daily.   sucralfate (CARAFATE) 1 g tablet Take 1 tablet (1 g total) by mouth 4 (four) times daily -  with meals and at bedtime.   tamsulosin (FLOMAX) 0.4 MG CAPS capsule Take 0.4 mg by mouth at bedtime.   triamcinolone (NASACORT) 55 MCG/ACT AERO nasal inhaler Place 2 sprays into the nose daily.   Zinc 220 (50 Zn) MG CAPS Take 220 mg by mouth daily.   No facility-administered encounter medications on file as of 07/05/2022.    Allergies (verified) Dilantin [phenytoin], Dilaudid [hydromorphone hcl],  Sulfa antibiotics, Morphine, Ace inhibitors, Augmentin [amoxicillin-pot clavulanate], and Codeine   History: Past Medical History:  Diagnosis Date   Allergic rhinitis    Anemia, pernicious    Anxiety and depression    sees Dr. [name]   Arthritis    B12 deficiency 01/23/2020   Catheter-associated urinary tract infection ([place])    Diverticulosis of colon    Elevated PSA    and hypogonadism, sees urologist   GERD (gastroesophageal reflux disease)    hiatal hernia   Hx of blood transfusion reaction 1999   Hyperlipidemia    Hyperparathyroidism ([place])    Hyperplastic colon polyp    Hypertension    acei causes cough   IBS (irritable bowel syndrome)    Nephrolithiasis    Obstructive chronic bronchitis without exacerbation, followed by Dr. [name] 03/04/2009   ONO RA  Normal 07/28/11 6 min walk >536mno desat PFTs 08/08/11: DLCO 70% TLC 80%  FeV1 80%   Fef 25 75 56% with significant improvement after BD    [place]    [place] in [place]   Pneumonia    PONV (postoperative nausea and vomiting)    Rectal fissure    Trigeminal neuralgia    has facial asymetry   Ventral hernia    Past Surgical History:  Procedure Laterality Date   ABDOMINAL SURGERY     Multiple   BRAIN SURGERY  01/2011   Trigeminal nerve/Dr SJenner 2019   tooth extraction, root canal   ESOPHAGEAL MANOMETRY N/A 04/28/2020   Procedure: ESOPHAGEAL MANOMETRY (EM);  Surgeon: NMauri Pole MD;  Location: [facility];  Service: Endoscopy;  Laterality: N/A;   HAND SURGERY  1973   Left   HERNIA REPAIR  1999   KNEE SURGERY  1997   Left   LITHOTRIPSY     Right   NISSEN FUNDOPLICATION  17035   complicated by gastric perforation, peritonitis, ventral nernia    RHIZOTOMY Right 11/17/2014   Procedure: RHIZOTOMY TO RIGHT V2,V3;  Surgeon: [name] MD;  [facility];  Service: Neurosurgery;  Laterality: Right;  right   RHIZOTOMY Right 12/22/2014   Procedure:  Right V2 and V3 trigeminal rhysolysis;  Surgeon: [name] MD;  [facility];  Service: Neurosurgery;  Laterality: Right;  Right V2 and V3 trigeminal rhysolysis   RHIZOTOMY W/ RADIOFREQUENCY ABLATION  08/2017   Dr HMaryjean Ka  UPPER GASTROINTESTINAL ENDOSCOPY     VENTRAL HERNIA REPAIR  2004   Family History  Problem Relation Age of Onset   Colon polyps Mother    Diabetes Mother    Coronary artery disease Mother        CABG   Diabetes Sister    Diabetes Brother    Hypertension Brother    Heart disease Brother    Heart disease Brother    Colon cancer Neg Hx  Esophageal cancer Neg Hx    Stomach cancer Neg Hx    Pancreatic cancer Neg Hx    Social History   Socioeconomic History   Marital status: Married    Spouse name: Not on file   Number of children: 2   Years of education: Not on file   Highest education level: Not on file  Occupational History   Occupation: Disabled - psych  Tobacco Use   Smoking status: Former    Packs/day: 1.00    Years: 20.00    Total pack years: 20.00    Types: Cigarettes    Quit date: 11/06/1996    Years since quitting: 25.6   Smokeless tobacco: Former    Types: Chew    Quit date: 11/07/1983  Vaping Use   Vaping Use: Never used  Substance and Sexual Activity   Alcohol use: No   Drug use: No   Sexual activity: Yes    Partners: Female  Other Topics Concern   Not on file  Social History Narrative   Daily Caffeine Use:  Yes         Social Determinants of Health   Financial Resource Strain: Low Risk  (07/05/2022)   Overall Financial Resource Strain (CARDIA)    Difficulty of Paying Living Expenses: Not hard at all  Food Insecurity: No Food Insecurity (07/05/2022)   Hunger Vital Sign    Worried About Running Out of Food in the Last Year: Never true    [place] in the Last Year: Never true  Transportation Needs: No Transportation Needs (07/05/2022)   PRAPARE - Hydrologist (Medical): No    Lack  of Transportation (Non-Medical): No  Physical Activity: Inactive (07/05/2022)   Exercise Vital Sign    Days of Exercise per Week: 0 days    Minutes of Exercise per Session: 0 min  Stress: No Stress Concern Present (07/05/2022)   [place]    Feeling of Stress : Not at all  Social Connections: [place] (07/05/2022)   Social Connection and Isolation Panel [NHANES]    Frequency of Communication with Friends and Family: More than three times a week    Frequency of Social Gatherings with Friends and Family: More than three times a week    Attends Religious Services: More than 4 times per year    Active Member of [employer] or Organizations: Yes    Attends Music therapist: More than 4 times per year    Marital Status: Married    Tobacco Counseling Counseling given: Not Answered   Clinical Intake:  Pre-visit preparation completed: Yes  Pain : 0-10 Pain Score: 4  Pain Type: Chronic pain Pain Location: Back Pain Orientation: Lower, Right Pain Radiating Towards: lower extremity Pain Descriptors / Indicators: Discomfort, Aching, Constant Pain Onset: More than a month ago Pain Frequency: Constant Pain Relieving Factors: Injections Effect of Pain on Daily Activities: Pain can diminish job performance, lower motivation to exercise and prevent you from completing daily tasks.  Pain produces disability and affects the quality of life.  Pain Relieving Factors: Injections  BMI - recorded: 32.08 Nutritional Status: BMI > 30  Obese Nutritional Risks: None Diabetes: No  How often do you need to have someone help you when you read instructions, pamphlets, or other written materials from your doctor or pharmacy?: 1 - Never What is the last grade level you completed in school?: HSG; some college courses at  [employer]  Diabetic? no  Interpreter Needed?: No  Information entered by :: Lisette Abu,  LPN.   Activities of Daily Living    07/05/2022    1:46 PM  In your present state of health, do you have any difficulty performing the following activities:  Hearing? 0  Vision? 0  Difficulty concentrating or making decisions? 0  Walking or climbing stairs? 0  Dressing or bathing? 0  Doing errands, shopping? 0  Preparing Food and eating ? N  Using the Toilet? N  In the past six months, have you accidently leaked urine? N  Do you have problems with loss of bowel control? N  Managing your Medications? N  Managing your Finances? N  Housekeeping or managing your Housekeeping? N    Patient Care Team: [name], MD as PCP - General (Internal Medicine) Josue Hector, MD as PCP - Cardiology (Cardiology) Kathie Rhodes, MD (Inactive) (Urology) [place] Dragon, MD (Inactive) (Gastroenterology) Chucky May, MD (Psychiatry) Steffanie Rainwater, DPM (Podiatry) Lavonna Monarch, MD (Dermatology) Johnathan Hausen, MD (General Surgery) Monna Fam, MD as Consulting Physician (Ophthalmology)  Indicate any recent Medical Services you may have received from other than [facility] providers in the past year (date may be approximate).     Assessment:   This is a routine wellness examination for [place].  Hearing/Vision screen Hearing Screening - Comments:: Denies hearing difficulties   Vision Screening - Comments:: Wears rx glasses - up to date with routine eye exams with Monna Fam, MD.   Dietary issues and exercise activities discussed: Current Exercise Habits: The patient does not participate in regular exercise at present, Exercise limited by: orthopedic condition(s);respiratory conditions(s)   Goals Addressed             This Visit's Progress    Stay healthy and maintain.        Depression Screen    07/05/2022    1:44 PM 05/12/2022    9:57 AM 11/11/2021    8:57 AM 02/02/2021   10:51 AM 02/02/2021    9:55 AM 08/04/2020   10:49 AM 01/23/2020   11:13 AM  PHQ 2/9 Scores  PHQ - 2  Score 0 0 0 0 0 0 0  PHQ- 9 Score 0 0         Fall Risk    07/05/2022    1:46 PM 05/12/2022    9:57 AM 11/11/2021    8:57 AM 02/02/2021   10:51 AM 02/02/2021    9:55 AM  Fall Risk   Falls in the past year? 0 0 0 0 0  Number falls in past yr: 0 0 0    Injury with Fall? 0 0 0    Risk for fall due to : No Fall Risks      Follow up Falls prevention discussed    Falls evaluation completed    FALL RISK PREVENTION PERTAINING TO THE HOME:  Any stairs in or around the home? No  If so, are there any without handrails? No  Home free of loose throw rugs in walkways, pet beds, electrical cords, etc? Yes  Adequate lighting in your home to reduce risk of falls? Yes   ASSISTIVE DEVICES UTILIZED TO PREVENT FALLS:  Life alert? No  Use of a cane, walker or w/c? No  Grab bars in the bathroom? No  Shower chair or bench in shower? No  Elevated toilet seat or a handicapped toilet? No   TIMED UP AND GO:  Was the test performed? No .  Length of time to ambulate 10 feet: n/a sec.   Appearance of gait: Gait not evaluated during this visit.  Cognitive Function:        07/05/2022    1:47 PM  6CIT Screen  What Year? 0 points  What month? 0 points  What time? 0 points  Count back from 20 0 points  Months in reverse 0 points  Repeat phrase 0 points  Total Score 0 points    Immunizations Immunization History  Administered Date(s) Administered   Influenza Split 09/12/2021   Influenza Whole 09/23/2007, 08/06/2010, 08/22/2011   Influenza,inj,Quad PF,6+ Mos 09/21/2015, 07/14/2016   Influenza-Unspecified 09/08/2014, 08/10/2017, 08/19/2019   Moderna Sars-Covid-2 Vaccination 03/24/2020, 04/20/2020, 10/06/2020   Pneumococcal Conjugate-13 11/12/2020   Pneumococcal Polysaccharide-23 11/07/1999, 09/14/2017, 01/24/2018   Td 05/03/2005   Tdap 01/18/2017   Zoster Recombinat (Shingrix) 06/24/2021, 09/05/2021    TDAP status: Up to date  Flu Vaccine status: Due, Education has been provided regarding  the importance of this vaccine. Advised may receive this vaccine at local pharmacy or Health Dept. Aware to provide a copy of the vaccination record if obtained from local pharmacy or Health Dept. Verbalized acceptance and understanding.  Pneumococcal vaccine status: Up to date  Covid-19 vaccine status: Completed vaccines  Qualifies for Shingles Vaccine? Yes   Zostavax completed No   Shingrix Completed?: Yes  Screening Tests Health Maintenance  Topic Date Due   INFLUENZA VACCINE  06/06/2022   COLONOSCOPY (Pts 45-44yr Insurance coverage will need to be confirmed)  08/24/2024   TETANUS/TDAP  01/19/2027   Pneumonia Vaccine 70 Years old  Completed   Hepatitis C Screening  Completed   Zoster Vaccines- Shingrix  Completed   HPV VACCINES  Aged Out   COVID-19 Vaccine  Discontinued    Health Maintenance  Health Maintenance Due  Topic Date Due   INFLUENZA VACCINE  06/06/2022    Colorectal cancer screening: Type of screening: Colonoscopy. Completed 08/24/2021. Repeat every 3 years  Lung Cancer Screening: (Low Dose CT Chest recommended if Age 70-80years, 30 pack-year currently smoking OR have quit w/in 15years.) does not qualify.   Lung Cancer Screening Referral: no  Additional Screening:  Hepatitis C Screening: does qualify; Completed 01/16/2014  Vision Screening: Recommended annual ophthalmology exams for early detection of glaucoma and other disorders of the eye. Is the patient up to date with their annual eye exam?  Yes  Who is the provider or what is the name of the office in which the patient attends annual eye exams? KMonna Fam MD. (Scheduled for 08/2022) If pt is not established with a provider, would they like to be referred to a provider to establish care? No .   Dental Screening: Recommended annual dental exams for proper oral hygiene  Community Resource Referral / Chronic Care Management: CRR required this visit?  No   CCM required this visit?  No       Plan:     I have personally reviewed and noted the following in the patient's chart:   Medical and social history Use of alcohol, tobacco or illicit drugs  Current medications and supplements including opioid prescriptions. Patient is not currently taking opioid prescriptions. Functional ability and status Nutritional status Physical activity Advanced directives List of other physicians Hospitalizations, surgeries, and ER visits in previous 12 months Vitals Screenings to include cognitive, depression, and falls Referrals and appointments  In addition, I have reviewed and discussed with patient certain preventive protocols, quality metrics, and best practice recommendations. A written  personalized care plan for preventive services as well as general preventive health recommendations were provided to patient.     Sheral Flow, LPN   [id number]/9093   Nurse Notes:  Patient is cogitatively intact. There were no vitals filed for this visit. Patient provided weight and height for this visit.

## 2022-07-05 NOTE — Telephone Encounter (Signed)
Patient is c/o LBP radiating to right lower leg.  Pain scale starts at 4 when sitting, once moving it reaches 10 and he can barely walk.  Patient is requesting to have some images done or should he make an appt to see you early as possible?

## 2022-07-05 NOTE — Telephone Encounter (Signed)
Needs rov - any provider

## 2022-07-05 NOTE — Patient Instructions (Signed)
[name] , Thank you for taking time to come for your Medicare Wellness Visit. I appreciate your ongoing commitment to your health goals. Please review the following plan we discussed and let me know if I can assist you in the future.   Screening recommendations/referrals: Colonoscopy: 08/24/2021; due every 3 years Recommended yearly ophthalmology/optometry visit for glaucoma screening and checkup Recommended yearly dental visit for hygiene and checkup  Vaccinations: Influenza vaccine: 09/12/2021; due Fall Season 2023 Pneumococcal vaccine: 01/24/2018, 11/12/2020 Tdap vaccine: 01/18/2017; due every 10 years Shingles vaccine: 06/24/2021, 09/05/2021   Covid-19: 03/24/2020, 04/20/2020, 10/06/2020  Advanced directives: No; Advance directive discussed with you today. Even though you declined this today please call our office should you change your mind and we can give you the proper paperwork for you to fill out.  Conditions/risks identified: Yes; Stay healthy and maintain my independence.  Next appointment: Follow up in one year for your annual wellness visit with Nurse Mignon Pine.  Preventive Care 70 Years and Older, Male  Preventive care refers to lifestyle choices and visits with your health care provider that can promote health and wellness. What does preventive care include? A yearly physical exam. This is also called an annual well check. Dental exams once or twice a year. Routine eye exams. Ask your health care provider how often you should have your eyes checked. Personal lifestyle choices, including: Daily care of your teeth and gums. Regular physical activity. Eating a healthy diet. Avoiding tobacco and drug use. Limiting alcohol use. Practicing safe sex. Taking low doses of aspirin every day. Taking vitamin and mineral supplements as recommended by your health care provider. What happens during an annual well check? The services and screenings done by your health care provider  during your annual well check will depend on your age, overall health, lifestyle risk factors, and family history of disease. Counseling  Your health care provider may ask you questions about your: Alcohol use. Tobacco use. Drug use. Emotional well-being. Home and relationship well-being. Sexual activity. Eating habits. History of falls. Memory and ability to understand (cognition). Work and work Statistician. Screening  You may have the following tests or measurements: Height, weight, and BMI. Blood pressure. Lipid and cholesterol levels. These may be checked every 5 years, or more frequently if you are over 80 years old. Skin check. Lung cancer screening. You may have this screening every year starting at age 70 if you have a 30-pack-year history of smoking and currently smoke or have quit within the past 15 years. Fecal occult blood test (FOBT) of the stool. You may have this test every year starting at age 70. Flexible sigmoidoscopy or colonoscopy. You may have a sigmoidoscopy every 5 years or a colonoscopy every 10 years starting at age 70. Prostate cancer screening. Recommendations will vary depending on your family history and other risks. Hepatitis C blood test. Hepatitis B blood test. Sexually transmitted disease (STD) testing. Diabetes screening. This is done by checking your blood sugar (glucose) after you have not eaten for a while (fasting). You may have this done every 1-3 years. Abdominal aortic aneurysm (AAA) screening. You may need this if you are a current or former smoker. Osteoporosis. You may be screened starting at age 70 if you are at high risk. Talk with your health care provider about your test results, treatment options, and if necessary, the need for more tests. Vaccines  Your health care provider may recommend certain vaccines, such as: Influenza vaccine. This is recommended every year. Tetanus, diphtheria,  and acellular pertussis (Tdap, Td) vaccine. You  may need a Td booster every 10 years. Zoster vaccine. You may need this after age 12. Pneumococcal 13-valent conjugate (PCV13) vaccine. One dose is recommended after age 11. Pneumococcal polysaccharide (PPSV23) vaccine. One dose is recommended after age 13. Talk to your health care provider about which screenings and vaccines you need and how often you need them. This information is not intended to replace advice given to you by your health care provider. Make sure you discuss any questions you have with your health care provider. Document Released: 11/19/2015 Document Revised: 07/12/2016 Document Reviewed: 08/24/2015 Elsevier Interactive Patient Education  2017 [place] Prevention in the Home Falls can cause injuries. They can happen to people of all ages. There are many things you can do to make your home safe and to help prevent falls. What can I do on the outside of my home? Regularly fix the edges of walkways and driveways and fix any cracks. Remove anything that might make you trip as you walk through a door, such as a raised step or threshold. Trim any bushes or trees on the path to your home. Use bright outdoor lighting. Clear any walking paths of anything that might make someone trip, such as rocks or tools. Regularly check to see if handrails are loose or broken. Make sure that both sides of any steps have handrails. Any raised decks and porches should have guardrails on the edges. Have any leaves, snow, or ice cleared regularly. Use sand or salt on walking paths during winter. Clean up any spills in your garage right away. This includes oil or grease spills. What can I do in the bathroom? Use night lights. Install grab bars by the toilet and in the tub and shower. Do not use towel bars as grab bars. Use non-skid mats or decals in the tub or shower. If you need to sit down in the shower, use a plastic, non-slip stool. Keep the floor dry. Clean up any water that spills  on the floor as soon as it happens. Remove soap buildup in the tub or shower regularly. Attach bath mats securely with double-sided non-slip rug tape. Do not have throw rugs and other things on the floor that can make you trip. What can I do in the bedroom? Use night lights. Make sure that you have a light by your bed that is easy to reach. Do not use any sheets or blankets that are too big for your bed. They should not hang down onto the floor. Have a firm chair that has side arms. You can use this for support while you get dressed. Do not have throw rugs and other things on the floor that can make you trip. What can I do in the kitchen? Clean up any spills right away. Avoid walking on wet floors. Keep items that you use a lot in easy-to-reach places. If you need to reach something above you, use a strong step stool that has a grab bar. Keep electrical cords out of the way. Do not use floor polish or wax that makes floors slippery. If you must use wax, use non-skid floor wax. Do not have throw rugs and other things on the floor that can make you trip. What can I do with my stairs? Do not leave any items on the stairs. Make sure that there are handrails on both sides of the stairs and use them. Fix handrails that are broken or loose. Make sure  that handrails are as long as the stairways. Check any carpeting to make sure that it is firmly attached to the stairs. Fix any carpet that is loose or worn. Avoid having throw rugs at the top or bottom of the stairs. If you do have throw rugs, attach them to the floor with carpet tape. Make sure that you have a light switch at the top of the stairs and the bottom of the stairs. If you do not have them, ask someone to add them for you. What else can I do to help prevent falls? Wear shoes that: Do not have high heels. Have rubber bottoms. Are comfortable and fit you well. Are closed at the toe. Do not wear sandals. If you use a stepladder: Make  sure that it is fully opened. Do not climb a closed stepladder. Make sure that both sides of the stepladder are locked into place. Ask someone to hold it for you, if possible. Clearly mark and make sure that you can see: Any grab bars or handrails. First and last steps. Where the edge of each step is. Use tools that help you move around (mobility aids) if they are needed. These include: Canes. Walkers. Scooters. Crutches. Turn on the lights when you go into a dark area. Replace any light bulbs as soon as they burn out. Set up your furniture so you have a clear path. Avoid moving your furniture around. If any of your floors are uneven, fix them. If there are any pets around you, be aware of where they are. Review your medicines with your doctor. Some medicines can make you feel dizzy. This can increase your chance of falling. Ask your doctor what other things that you can do to help prevent falls. This information is not intended to replace advice given to you by your health care provider. Make sure you discuss any questions you have with your health care provider. Document Released: 08/19/2009 Document Revised: 03/30/2016 Document Reviewed: 11/27/2014 Elsevier Interactive Patient Education  2017 [employer].

## 2022-07-06 ENCOUNTER — Encounter: Payer: Self-pay | Admitting: Family Medicine

## 2022-07-06 ENCOUNTER — Ambulatory Visit (INDEPENDENT_AMBULATORY_CARE_PROVIDER_SITE_OTHER): Payer: Medicare Other

## 2022-07-06 ENCOUNTER — Ambulatory Visit: Payer: Medicare Other | Admitting: Family Medicine

## 2022-07-06 ENCOUNTER — Ambulatory Visit (INDEPENDENT_AMBULATORY_CARE_PROVIDER_SITE_OTHER): Payer: Medicare Other | Admitting: Family Medicine

## 2022-07-06 VITALS — BP 124/76 | HR 65 | Temp 97.6°F | Ht 71.0 in | Wt 226.0 lb

## 2022-07-06 VITALS — BP 114/72 | HR 76 | Ht 71.0 in | Wt 228.0 lb

## 2022-07-06 DIAGNOSIS — M545 Low back pain, unspecified: Secondary | ICD-10-CM | POA: Diagnosis not present

## 2022-07-06 DIAGNOSIS — M5416 Radiculopathy, lumbar region: Secondary | ICD-10-CM

## 2022-07-06 DIAGNOSIS — M5430 Sciatica, unspecified side: Secondary | ICD-10-CM | POA: Diagnosis not present

## 2022-07-06 MED ORDER — KETOROLAC TROMETHAMINE 60 MG/2ML IM SOLN
60.0000 mg | Freq: Once | INTRAMUSCULAR | Status: AC
  Administered 2022-07-06: 60 mg via INTRAMUSCULAR

## 2022-07-06 NOTE — Progress Notes (Signed)
Subjective:     Patient ID: [name], male    DOB: 02-21-1951, 71 y.o.   MRN: [id number]  Chief Complaint  Patient presents with   Leg Pain    Right glute pain radiating down his right leg to his ankle.     HPI Patient is in today for a 2-3 week hx of right buttock pain radiating down his right leg.  Laying down is most comfortable and bending forward. Severe pain with walking, sitting and changing positions. Denies fever, chills, abdominal pain, N/V/D. No urinary symptoms. Denies incontinence. No numbness, tingling or weakness.   Taking Tylenol and his usual gabapentin 400 mg every 6 hours.  States oral steroids did not help in the past.  Muscle relaxant declined and states it did not help either.   He has seen sports medicine in the past for this. States he had an injection at [facility] over a year ago which helped.  He has seen Dr. Reatha Armour- [facility]     There are no preventive care reminders to display for this patient.  Past Medical History:  Diagnosis Date   Allergic rhinitis    Anemia, pernicious    Anxiety and depression    sees Dr. [name]   Arthritis    B12 deficiency 01/23/2020   Catheter-associated urinary tract infection ([place])    Diverticulosis of colon    Elevated PSA    and hypogonadism, sees urologist   GERD (gastroesophageal reflux disease)    hiatal hernia   Hx of blood transfusion reaction 1999   Hyperlipidemia    Hyperparathyroidism ([place])    Hyperplastic colon polyp    Hypertension    acei causes cough   IBS (irritable bowel syndrome)    Nephrolithiasis    Obstructive chronic bronchitis without exacerbation, followed by Dr. [name] 03/04/2009   ONO RA  Normal 07/28/11 6 min walk >542mno desat PFTs 08/08/11: DLCO 70% TLC 80%  FeV1 80%   Fef 25 75 56% with significant improvement after BD    [place] in [place]   Pneumonia    PONV (postoperative nausea and vomiting)    Rectal fissure     Trigeminal neuralgia    has facial asymetry   Ventral hernia     Past Surgical History:  Procedure Laterality Date   ABDOMINAL SURGERY     Multiple   BRAIN SURGERY  01/2011   Trigeminal nerve/Dr [place] 2019   tooth extraction, root canal   ESOPHAGEAL MANOMETRY N/A 04/28/2020   Procedure: ESOPHAGEAL MANOMETRY (EM);  Surgeon: NMauri Pole MD;  Location: [facility];  Service: Endoscopy;  Laterality: N/A;   HAND SURGERY  1973   Left   HERNIA REPAIR  1999   KNEE SURGERY  1997   Left   LITHOTRIPSY     Right   NISSEN FUNDOPLICATION  19924   complicated by gastric perforation, peritonitis, ventral nernia    RHIZOTOMY Right 11/17/2014   Procedure: RHIZOTOMY TO RIGHT V2,V3;  Surgeon: [name] MD;  [facility];  Service: Neurosurgery;  Laterality: Right;  right   RHIZOTOMY Right 12/22/2014   Procedure: Right V2 and V3 trigeminal rhysolysis;  Surgeon: [name] MD;  [facility];  Service: Neurosurgery;  Laterality: Right;  Right V2 and V3 trigeminal rhysolysis   RHIZOTOMY W/ RADIOFREQUENCY ABLATION  08/2017   Dr [facility]aryjean Ka  [facility]     VENTRAL HERNIA REPAIR  2004    Family History  Problem Relation Age of Onset   Colon polyps Mother    Diabetes Mother    Coronary artery disease Mother        CABG   Diabetes Sister    Diabetes Brother    Hypertension Brother    Heart disease Brother    Heart disease Brother    Colon cancer Neg Hx    Esophageal cancer Neg Hx    Stomach cancer Neg Hx    Pancreatic cancer Neg Hx     Social History   Socioeconomic History   Marital status: Married    Spouse name: Not on file   Number of children: 2   Years of education: Not on file   Highest education level: Not on file  Occupational History   Occupation: Disabled - psych  Tobacco Use   Smoking status: Former    Packs/day: 1.00    Years: 20.00    Total pack years: 20.00    Types:  Cigarettes    Quit date: 11/06/1996    Years since quitting: 25.6   Smokeless tobacco: Former    Types: Chew    Quit date: 11/07/1983  Vaping Use   Vaping Use: Never used  Substance and Sexual Activity   Alcohol use: No   Drug use: No   Sexual activity: Yes    Partners: Female  Other Topics Concern   Not on file  Social History Narrative   Daily Caffeine Use:  Yes         Social Determinants of Health   Financial Resource Strain: Low Risk  (07/05/2022)   Overall Financial Resource Strain (CARDIA)    Difficulty of Paying Living Expenses: Not hard at all  Food Insecurity: No Food Insecurity (07/05/2022)   Hunger Vital Sign    Worried About Running Out of Food in the Last Year: Never true    Ran Out of Food in the Last Year: Never true  Transportation Needs: No Transportation Needs (07/05/2022)   PRAPARE - Hydrologist (Medical): No    Lack of Transportation (Non-Medical): No  Physical Activity: Inactive (07/05/2022)   Exercise Vital Sign    Days of Exercise per Week: 0 days    Minutes of Exercise per Session: 0 min  Stress: No Stress Concern Present (07/05/2022)   [place]    Feeling of Stress : Not at all  Social Connections: [place] (07/05/2022)   Social Connection and Isolation Panel [NHANES]    Frequency of Communication with Friends and Family: More than three times a week    Frequency of Social Gatherings with Friends and Family: More than three times a week    Attends Religious Services: More than 4 times per year    Active Member of [employer] or Organizations: Yes    Attends Music therapist: More than 4 times per year    Marital Status: Married  Human resources officer Violence: Not At Risk (07/05/2022)   Humiliation, Afraid, Rape, and Kick questionnaire    Fear of Current or Ex-Partner: No    Emotionally Abused: No    Physically Abused: No    Sexually Abused:  No    Outpatient Medications Prior to Visit  Medication Sig Dispense Refill   acetaminophen (TYLENOL) 325 MG tablet Take 2 tablets (650 mg total) by mouth every 6 (six) hours as  needed for mild pain, moderate pain or fever.     ALPRAZolam (XANAX) 1 MG tablet Take 1 tablet (1 mg total) by mouth 4 (four) times daily as needed for anxiety. 120 tablet 5   carbamazepine (TEGRETOL XR) 100 MG 12 hr tablet Take 100-200 mg by mouth See admin instructions. '100mg'$  in the morning and '200mg'$  at night.     Cholecalciferol 50 MCG (2000 UT) TABS 1 tab by mouth once daily 30 tablet 99   cyanocobalamin (,VITAMIN B-12,) 1000 MCG/ML injection INJECT ONE ML in every 15 DAYS 6 mL 3   finasteride (PROSCAR) 5 MG tablet Take 5 mg by mouth daily.     gabapentin (NEURONTIN) 400 MG capsule Take 400-800 mg by mouth See admin instructions. Taking '400mg'$  at 0800, 1400, then 2 capsules ('800mg'$ ) at bedtime.     lansoprazole (PREVACID) 30 MG capsule TAKE ONE CAPSULE BY MOUTH TWICE DAILY 180 capsule 1   losartan-hydrochlorothiazide (HYZAAR) 50-12.5 MG tablet Take 1 tablet by mouth daily. 90 tablet 3   polyethylene glycol powder (GLYCOLAX/MIRALAX) powder DISSOLVE 1 CAPFUL IN LIQUID EVERY DAY AS NEEDED FOR CONSTIPATION 527 g 2   potassium chloride SA (KLOR-CON M) 20 MEQ tablet Take 1 tablet (20 mEq total) by mouth 2 (two) times daily. 180 tablet 3   Probiotic Product (ALIGN) 4 MG CAPS Take 4 mg by mouth daily.     sucralfate (CARAFATE) 1 g tablet Take 1 tablet (1 g total) by mouth 4 (four) times daily -  with meals and at bedtime. 120 tablet 2   tamsulosin (FLOMAX) 0.4 MG CAPS capsule Take 0.4 mg by mouth at bedtime.     Zinc 220 (50 Zn) MG CAPS Take 220 mg by mouth daily.     cyclobenzaprine (FLEXERIL) 10 MG tablet Take 10 mg by mouth 2 (two) times daily.     predniSONE (DELTASONE) 10 MG tablet 3 tabs by mouth per day for 3 days,2tabs per day for 3 days,1tab per day for 3 days 18 tablet 0   triamcinolone (NASACORT) 55 MCG/ACT AERO  nasal inhaler Place 2 sprays into the nose daily. 1 each 12   No facility-administered medications prior to visit.    Allergies  Allergen Reactions   Dilantin [Phenytoin] Other (See Comments)    bradycardia   Dilaudid [Hydromorphone Hcl] Nausea And Vomiting   Sulfa Antibiotics Hives   Morphine    Ace Inhibitors Cough   Augmentin [Amoxicillin-Pot Clavulanate] Nausea And Vomiting   Codeine Nausea And Vomiting    ROS     Objective:    Physical Exam Constitutional:      General: He is not in acute distress.    Appearance: He is ill-appearing.  Cardiovascular:     Rate and Rhythm: Normal rate.  Pulmonary:     Effort: Pulmonary effort is normal.  Abdominal:     Tenderness: There is no right CVA tenderness or left CVA tenderness.  Musculoskeletal:     Cervical back: Normal and normal range of motion.     Thoracic back: Normal.     Lumbar back: Tenderness present. Decreased range of motion. Positive right straight leg raise test.     Right lower leg: No edema.     Left lower leg: No edema.     Comments: TTP over right SI notch Pain with movement.  Pain relieved when leaning forward   Skin:    General: Skin is warm and dry.     Capillary Refill: Capillary refill takes less than  2 seconds.     Findings: No rash.  Neurological:     General: No focal deficit present.     Mental Status: He is alert and oriented to person, place, and time.     Cranial Nerves: No cranial nerve deficit.     Sensory: No sensory deficit.     Motor: No weakness.     Coordination: Coordination normal.     Gait: Gait abnormal.  Psychiatric:        Mood and Affect: Mood normal.        Behavior: Behavior normal.     BP 124/76 (BP Location: Left Arm, Patient Position: Sitting, Cuff Size: Large)   Pulse 65   Temp 97.6 F (36.4 C) (Temporal)   Ht '5\' 11"'$  (1.803 m)   Wt 226 lb (102.5 kg)   SpO2 96%   BMI 31.52 kg/m  Wt Readings from Last 3 Encounters:  07/06/22 226 lb (102.5 kg)  07/05/22  230 lb (104.3 kg)  05/12/22 230 lb (104.3 kg)       Assessment & Plan:   Problem List Items Addressed This Visit       Nervous and Auditory   Lumbar radiculopathy, right - Primary   Other Visit Diagnoses     Sciatic leg pain       Relevant Medications   ketorolac (TORADOL) injection 60 mg (Completed)      No red flag symptoms but he is in severe pain.  Toradol injection 60 mg IM given in office. Declines oral steroids, muscle relaxant as these did not help in the past. He will continue with topical analgesic, heating pad, Tylenol extra strength for now.  He will also check and see if his sports medicine physician downstairs can see him today.  If not, I am happy to send in a prescription NSAID for him to take.  Advised that he should also call and schedule with his neurosurgeon  I have discontinued Lakyn M. Shedden's cyclobenzaprine, predniSONE, and triamcinolone. I am also having him maintain his polyethylene glycol powder, Align, carbamazepine, acetaminophen, Zinc, tamsulosin, gabapentin, finasteride, Cholecalciferol, sucralfate, lansoprazole, ALPRAZolam, losartan-hydrochlorothiazide, potassium chloride SA, and cyanocobalamin. We administered ketorolac.  Meds ordered this encounter  Medications   ketorolac (TORADOL) injection 60 mg

## 2022-07-06 NOTE — Patient Instructions (Signed)
Xray today [facility] for epidural [phone] See me back as needed

## 2022-07-06 NOTE — Progress Notes (Signed)
I, [name], LAT, ATC acting as a scribe for Jeffrey Leader, MD.  Subjective:    CC: Low back pain  HPI: Pt is a 71 y/o male c/o low back pain. Pt was previously seen by Dr. Tamala Julian on 02/26/20 for lumbar radiculopathy and was sent for a lumbar ESI, later performed on 03/04/20. Today, pt c/o low back pain.  Pt locates pain to posterior aspect of R leg that radiates into the calf. Had Toradol injection in backside today. Pain relief only when he lies down. Epidural has been helpful for over 1 year.   Radiating pain: yes, R leg to calf LE numbness/tingling: no LE weakness: yes Aggravates: standing Treatments tried: hx esi, lying down   Dx imaging: 02/25/20 L-spine MRI  02/03/20 L-spine XR  Pertinent review of Systems: No fevers or chills  Relevant historical information: Hypertension.  Prior lumbar radiculopathy treated with epidural steroid injection 2 and half years ago. Trigeminal neuralgia on higher dose gabapentin.   Objective:    Vitals:   07/06/22 1336  BP: 114/72  Pulse: 76  SpO2: 98%   General: Well Developed, well nourished, and in no acute distress.   MSK: L-spine: Nontender midline.  Normal lumbar motion.  Lower extremity strength is intact. Reflexes are intact distally.  Lab and Radiology Results  X-ray images L-spine obtained today personally and independently interpreted Multilevel DDD with more significant findings at L1-L2 and some degenerative changes at L5-S1. Await formal radiology review   EXAM: MRI LUMBAR SPINE WITHOUT CONTRAST   TECHNIQUE: Multiplanar, multisequence MR imaging of the lumbar spine was performed. No intravenous contrast was administered.   COMPARISON:  Lumbar radiographs dated 02/03/2020   FINDINGS: Segmentation:  5 lumbar type vertebra.   Alignment:  Physiologic.   Vertebrae:  No fracture, evidence of discitis, or bone lesion.   Conus medullaris and cauda equina: Conus extends to the L1-2 level. Conus and cauda  equina appear normal.   Paraspinal and other soft tissues: Negative.   Disc levels:   T12-L1: Negative.   L1-2: Slight disc desiccation. Negative.   L2-3: Disc desiccation. Negative.   L3-4: Slight disc desiccation. Tiny bulge into the left neural foramen without foraminal stenosis. Minimal facet effusions. Otherwise negative.   L4-5: Minimal broad-based disc bulge extending into the inferior aspect of both neural foramina with mild bilateral foraminal stenosis. The L4 nerves appear to exit without impingement. Moderate bilateral facet arthritis with ligamentum flavum hypertrophy with compression of both lateral recesses with symmetrical compression of the thecal sac creating mild spinal stenosis.   L5-S1: Tiny annular fissure of the level of the right neural foramen without disc bulging or protrusion. Moderately severe bilateral facet arthritis. 6 mm synovial cyst arises from the right facet joint and extends into the right neural foramen with a mass effect upon the exiting right L5 nerve. Edema in right pedicle of L5.   There is also a synovial cyst arising from the superior aspect of the left neural foramen compressing the left L5. The cysts are best seen on images 32 and 33 of series 5 and images 6 and 12 of series 3.   IMPRESSION: 1. Moderately severe bilateral facet arthritis at L5-S1 with a synovial cyst arising from the right facet joint compressing the right L5 nerve in the right neural foramen. 2. Synovial cyst arising from the superior aspect of the left neural foramen at L5-S1 compressing the left L5 nerve. 3. Mild spinal stenosis at L4-5 due to hypertrophy of the ligamentum  flavum and facet joints.     Electronically Signed   By: Lorriane Shire M.D.   On: 02/26/2020 08:13  CLINICAL DATA:  Lumbosacral spondylosis without myelopathy. Low back and right leg pain for the past 2 months. Recent onset left anterior thigh pain. Severe bilateral L5-S1  neuroforaminal stenosis due to synovial cysts. L5-S1 injection requested.   FLUOROSCOPY TIME:  Radiation Exposure Index (as provided by the fluoroscopic device): 4.9 mGy   Fluoroscopy Time:  33 seconds   Number of Acquired Images:  0   PROCEDURE: The procedure, risks, benefits, and alternatives were explained to the patient. Questions regarding the procedure were encouraged and answered. The patient understands and consents to the procedure.   LUMBAR EPIDURAL INJECTION:   An interlaminar approach was performed on the right at L5-S1. The overlying skin was cleansed and anesthetized. A 3.5 inch 20 gauge epidural needle was advanced using loss-of-resistance technique.   DIAGNOSTIC EPIDURAL INJECTION:   Injection of Isovue-M 200 shows a good epidural pattern with spread above and below the level of needle placement, primarily on the right. No vascular opacification is seen.   THERAPEUTIC EPIDURAL INJECTION:   120 mg of Depo-Medrol mixed with 3 mL of 1% lidocaine were instilled. The procedure was well-tolerated, and the patient was discharged thirty minutes following the injection in good condition.   COMPLICATIONS: None immediate.   IMPRESSION: Technically successful interlaminar epidural injection on the right at L5-S1.     Electronically Signed   By: Titus Dubin M.D.   On: 03/04/2020 13:59        Impression and Recommendations:    Assessment and Plan: 71 y.o. male with lumbosacral radiculopathy right side.  Symptoms more consistent with S1 and some more mildly at L5 right side.  This is an acute exacerbation of a chronic problem occurring in 2021.  He had an epidural steroid injection as above in April 2021 which worked quite well.  Plan for repeat epidural steroid injection.  However if this is not sufficient next step would be repeat MRI.  He is already close to maxing out on gabapentin.  We talked about pain management.  Declined opiates which is reasonable.   We will hold off on prednisone as well for now.    PDMP not reviewed this encounter. Orders Placed This Encounter  Procedures   DG Lumbar Spine 2-3 Views    Standing Status:   Future    Number of Occurrences:   1    Standing Expiration Date:   07/07/2023    Order Specific Question:   Reason for Exam (SYMPTOM  OR DIAGNOSIS REQUIRED)    Answer:   lumbar spine pain    Order Specific Question:   Preferred imaging location?    Answer:   [facility]   DG INJECT DIAG/THERA/INC NEEDLE/CATH/PLC EPI/LUMB/SAC W/IMG    L5-S1  Level and technique per radiology LUMB EPI #1 L5-S1 [facility] MCR 228 LBS PACS (02/25/20) *ASA '81MG'$ -PT AWARE TO STOP 3 DAYS  NO THINS/OTC NO NEEDS PT AWARE NO SHOW FEE    Standing Status:   Future    Standing Expiration Date:   07/07/2023    Order Specific Question:   Reason for Exam (SYMPTOM  OR DIAGNOSIS REQUIRED)    Answer:   lumbar radiculopathy    Order Specific Question:   Preferred Imaging Location?    Answer:   [id number]    Order Specific Question:   Radiology Contrast Protocol - do NOT remove file  path    Answer:   [email or web address]   No orders of the defined types were placed in this encounter.   Discussed warning signs or symptoms. Please see discharge instructions. Patient expresses understanding.   The above documentation has been reviewed and is accurate and complete [name], M.D.

## 2022-07-07 NOTE — Progress Notes (Signed)
Lumbar spine x-ray shows mild arthritis.

## 2022-07-12 ENCOUNTER — Ambulatory Visit
Admission: RE | Admit: 2022-07-12 | Discharge: 2022-07-12 | Disposition: A | Discharge status: Home / Self Care | Payer: Medicare Other | Source: Ambulatory Visit | Attending: Family Medicine | Admitting: Family Medicine

## 2022-07-12 DIAGNOSIS — M47817 Spondylosis without myelopathy or radiculopathy, lumbosacral region: Secondary | ICD-10-CM | POA: Diagnosis not present

## 2022-07-12 DIAGNOSIS — M5416 Radiculopathy, lumbar region: Secondary | ICD-10-CM

## 2022-07-12 MED ORDER — METHYLPREDNISOLONE ACETATE 40 MG/ML INJ SUSP (RADIOLOG
80.0000 mg | Freq: Once | INTRAMUSCULAR | Status: AC
  Administered 2022-07-12: 80 mg via EPIDURAL

## 2022-07-12 MED ORDER — IOPAMIDOL (ISOVUE-M 200) INJECTION 41%
1.0000 mL | Freq: Once | INTRAMUSCULAR | Status: AC
  Administered 2022-07-12: 1 mL via EPIDURAL

## 2022-07-12 NOTE — Discharge Instructions (Signed)

## 2022-08-07 ENCOUNTER — Other Ambulatory Visit: Payer: Self-pay | Admitting: Gastroenterology

## 2022-09-09 ENCOUNTER — Other Ambulatory Visit: Payer: Self-pay | Admitting: Gastroenterology

## 2022-09-14 ENCOUNTER — Telehealth: Payer: Self-pay | Admitting: Family Medicine

## 2022-09-14 ENCOUNTER — Other Ambulatory Visit: Payer: Self-pay

## 2022-09-14 DIAGNOSIS — M5416 Radiculopathy, lumbar region: Secondary | ICD-10-CM

## 2022-09-14 NOTE — Telephone Encounter (Signed)
Spoke to pt and relayed this information. Pt was agreeable to proceed to repeat lumbar MRI. Order was placed to the [facility] location.

## 2022-09-14 NOTE — Telephone Encounter (Signed)
Pt had an epidural on 9/6, helped for a short time. Radiologist mentioned the option of doing a 2nd epidural and pt is interested in doing this if Dr. Georgina Snell agrees.

## 2022-09-14 NOTE — Telephone Encounter (Signed)
We are planning the epidural steroid injection based on an old MRI.  My general rule of thumb is we should do 1 epidural and if it helps great.  However if it does not help like we wanted to we should repeat your lumbar spine MRI to further evaluate source of pain and to better plan future epidural injections.  I would recommend prior to doing another epidural he should have an updated MRI.  Would you like me to order one now?

## 2022-09-18 DIAGNOSIS — H33102 Unspecified retinoschisis, left eye: Secondary | ICD-10-CM | POA: Diagnosis not present

## 2022-09-18 DIAGNOSIS — H35372 Puckering of macula, left eye: Secondary | ICD-10-CM | POA: Diagnosis not present

## 2022-09-18 DIAGNOSIS — H25013 Cortical age-related cataract, bilateral: Secondary | ICD-10-CM | POA: Diagnosis not present

## 2022-09-18 DIAGNOSIS — H179 Unspecified corneal scar and opacity: Secondary | ICD-10-CM | POA: Diagnosis not present

## 2022-09-18 DIAGNOSIS — H2513 Age-related nuclear cataract, bilateral: Secondary | ICD-10-CM | POA: Diagnosis not present

## 2022-09-18 DIAGNOSIS — H524 Presbyopia: Secondary | ICD-10-CM | POA: Diagnosis not present

## 2022-09-18 DIAGNOSIS — H31011 Macula scars of posterior pole (postinflammatory) (post-traumatic), right eye: Secondary | ICD-10-CM | POA: Diagnosis not present

## 2022-09-20 ENCOUNTER — Ambulatory Visit
Admission: RE | Admit: 2022-09-20 | Discharge: 2022-09-20 | Disposition: A | Discharge status: Home / Self Care | Payer: Medicare Other | Source: Ambulatory Visit | Attending: Family Medicine | Admitting: Family Medicine

## 2022-09-20 DIAGNOSIS — M545 Low back pain, unspecified: Secondary | ICD-10-CM | POA: Diagnosis not present

## 2022-09-20 DIAGNOSIS — M5416 Radiculopathy, lumbar region: Secondary | ICD-10-CM | POA: Diagnosis not present

## 2022-09-20 DIAGNOSIS — M48061 Spinal stenosis, lumbar region without neurogenic claudication: Secondary | ICD-10-CM | POA: Diagnosis not present

## 2022-09-20 DIAGNOSIS — M4316 Spondylolisthesis, lumbar region: Secondary | ICD-10-CM | POA: Diagnosis not present

## 2022-09-20 DIAGNOSIS — M79604 Pain in right leg: Secondary | ICD-10-CM | POA: Diagnosis not present

## 2022-09-22 ENCOUNTER — Telehealth: Payer: Self-pay | Admitting: Family Medicine

## 2022-09-22 DIAGNOSIS — M5416 Radiculopathy, lumbar region: Secondary | ICD-10-CM

## 2022-09-22 NOTE — Progress Notes (Signed)
MRI is similar to perhaps a little worse in places to prior lumbar spine MRI.  I have ordered another epidural steroid injection.  If this does not work there is another treatment that we could try.  Please contact [facility] to schedule the back injection.  Phone number is [phone].  Recommend recheck after you have your second back injection.

## 2022-09-22 NOTE — Telephone Encounter (Signed)
Epidural steroid injection ordered 

## 2022-10-02 ENCOUNTER — Ambulatory Visit
Admission: RE | Admit: 2022-10-02 | Discharge: 2022-10-02 | Disposition: A | Discharge status: Home / Self Care | Payer: Medicare Other | Source: Ambulatory Visit | Attending: Family Medicine | Admitting: Family Medicine

## 2022-10-02 DIAGNOSIS — M5416 Radiculopathy, lumbar region: Secondary | ICD-10-CM

## 2022-10-02 DIAGNOSIS — M47817 Spondylosis without myelopathy or radiculopathy, lumbosacral region: Secondary | ICD-10-CM | POA: Diagnosis not present

## 2022-10-02 MED ORDER — METHYLPREDNISOLONE ACETATE 40 MG/ML INJ SUSP (RADIOLOG
80.0000 mg | Freq: Once | INTRAMUSCULAR | Status: AC
  Administered 2022-10-02: 80 mg via EPIDURAL

## 2022-10-02 MED ORDER — IOPAMIDOL (ISOVUE-M 200) INJECTION 41%
1.0000 mL | Freq: Once | INTRAMUSCULAR | Status: AC
  Administered 2022-10-02: 1 mL via EPIDURAL

## 2022-10-02 NOTE — Discharge Instructions (Signed)

## 2022-11-05 ENCOUNTER — Other Ambulatory Visit: Payer: Self-pay | Admitting: Internal Medicine

## 2022-11-13 ENCOUNTER — Encounter: Payer: Self-pay | Admitting: Internal Medicine

## 2022-11-13 ENCOUNTER — Ambulatory Visit (INDEPENDENT_AMBULATORY_CARE_PROVIDER_SITE_OTHER): Payer: Medicare Other | Admitting: Internal Medicine

## 2022-11-13 VITALS — BP 124/76 | HR 58 | Temp 97.6°F | Ht 71.0 in | Wt 227.0 lb

## 2022-11-13 DIAGNOSIS — E538 Deficiency of other specified B group vitamins: Secondary | ICD-10-CM

## 2022-11-13 DIAGNOSIS — Z125 Encounter for screening for malignant neoplasm of prostate: Secondary | ICD-10-CM | POA: Diagnosis not present

## 2022-11-13 DIAGNOSIS — E7800 Pure hypercholesterolemia, unspecified: Secondary | ICD-10-CM

## 2022-11-13 DIAGNOSIS — R739 Hyperglycemia, unspecified: Secondary | ICD-10-CM | POA: Diagnosis not present

## 2022-11-13 DIAGNOSIS — I10 Essential (primary) hypertension: Secondary | ICD-10-CM

## 2022-11-13 DIAGNOSIS — E559 Vitamin D deficiency, unspecified: Secondary | ICD-10-CM

## 2022-11-13 DIAGNOSIS — Z0001 Encounter for general adult medical examination with abnormal findings: Secondary | ICD-10-CM | POA: Diagnosis not present

## 2022-11-13 LAB — URINALYSIS, ROUTINE W REFLEX MICROSCOPIC
Bilirubin Urine: NEGATIVE
Hgb urine dipstick: NEGATIVE
Ketones, ur: NEGATIVE
Leukocytes,Ua: NEGATIVE
Nitrite: NEGATIVE
Specific Gravity, Urine: 1.015 (ref 1.000–1.030)
Total Protein, Urine: NEGATIVE
Urine Glucose: NEGATIVE
Urobilinogen, UA: 0.2 (ref 0.0–1.0)
pH: 7 (ref 5.0–8.0)

## 2022-11-13 LAB — LIPID PANEL
Cholesterol: 153 mg/dL (ref 0–200)
HDL: 52.2 mg/dL (ref >39.00)
LDL Cholesterol: 79 mg/dL (ref 0–99)
NonHDL: 100.79
Total CHOL/HDL Ratio: 3
Triglycerides: 109 mg/dL (ref 0.0–149.0)
VLDL: 21.8 mg/dL (ref 0.0–40.0)

## 2022-11-13 LAB — BASIC METABOLIC PANEL
BUN: 8 mg/dL (ref 6–23)
CO2: 31 mEq/L (ref 19–32)
Calcium: 9.3 mg/dL (ref 8.4–10.5)
Chloride: 93 mEq/L — ABNORMAL LOW (ref 96–112)
Creatinine, Ser: 0.81 mg/dL (ref 0.40–1.50)
GFR: 89.39 mL/min (ref >60.00)
Glucose, Bld: 91 mg/dL (ref 70–99)
Potassium: 4.2 mEq/L (ref 3.5–5.1)
Sodium: 133 mEq/L — ABNORMAL LOW (ref 135–145)

## 2022-11-13 LAB — HEPATIC FUNCTION PANEL
ALT: 8 U/L (ref 0–53)
AST: 12 U/L (ref 0–37)
Albumin: 4.1 g/dL (ref 3.5–5.2)
Alkaline Phosphatase: 74 U/L (ref 39–117)
Bilirubin, Direct: 0.1 mg/dL (ref 0.0–0.3)
Total Bilirubin: 0.7 mg/dL (ref 0.2–1.2)
Total Protein: 6.9 g/dL (ref 6.0–8.3)

## 2022-11-13 LAB — VITAMIN D 25 HYDROXY (VIT D DEFICIENCY, FRACTURES): VITD: 34.71 ng/mL (ref 30.00–100.00)

## 2022-11-13 LAB — CBC WITH DIFFERENTIAL/PLATELET
Basophils Absolute: 0 10*3/uL (ref 0.0–0.1)
Basophils Relative: 0.6 % (ref 0.0–3.0)
Eosinophils Absolute: 0.1 10*3/uL (ref 0.0–0.7)
Eosinophils Relative: 0.7 % (ref 0.0–5.0)
HCT: 46.9 % (ref 39.0–52.0)
Hemoglobin: 15.9 g/dL (ref 13.0–17.0)
Lymphocytes Relative: 28.6 % (ref 12.0–46.0)
Lymphs Abs: 2.3 10*3/uL (ref 0.7–4.0)
MCHC: 33.9 g/dL (ref 30.0–36.0)
MCV: 90.6 fl (ref 78.0–100.0)
Monocytes Absolute: 0.5 10*3/uL (ref 0.1–1.0)
Monocytes Relative: 6.4 % (ref 3.0–12.0)
Neutro Abs: 5.1 10*3/uL (ref 1.4–7.7)
Neutrophils Relative %: 63.7 % (ref 43.0–77.0)
Platelets: 159 10*3/uL (ref 150.0–400.0)
RBC: 5.17 Mil/uL (ref 4.22–5.81)
RDW: 13.2 % (ref 11.5–15.5)
WBC: 8 10*3/uL (ref 4.0–10.5)

## 2022-11-13 LAB — PSA: PSA: 4.07 ng/mL — ABNORMAL HIGH (ref 0.10–4.00)

## 2022-11-13 LAB — HEMOGLOBIN A1C: Hgb A1c MFr Bld: 5.5 % (ref 4.6–6.5)

## 2022-11-13 LAB — VITAMIN B12: Vitamin B-12: 689 pg/mL (ref 211–911)

## 2022-11-13 LAB — TSH: TSH: 1.89 u[IU]/mL (ref 0.35–5.50)

## 2022-11-13 MED ORDER — LOSARTAN POTASSIUM-HCTZ 50-12.5 MG PO TABS: 1.0000 | ORAL_TABLET | Freq: Every day | ORAL | 3 refills | Status: DC

## 2022-11-13 MED ORDER — POTASSIUM CHLORIDE CRYS ER 20 MEQ PO TBCR: 20.0000 meq | EXTENDED_RELEASE_TABLET | Freq: Two times a day (BID) | ORAL | 3 refills | Status: DC

## 2022-11-13 MED ORDER — FINASTERIDE 5 MG PO TABS: 5.0000 mg | ORAL_TABLET | Freq: Every day | ORAL | 3 refills | Status: AC

## 2022-11-13 NOTE — Assessment & Plan Note (Signed)
Lab Results  Component Value Date   [place] 80 11/11/2021   Uncontrolled, goal ldl < 70,, pt to continue current low chol diet, declines statin for now

## 2022-11-13 NOTE — Progress Notes (Signed)
Patient ID: [name], male   DOB: 27-Feb-1951, 72 y.o.   MRN: [id number]         Chief Complaint:: wellness exam and low b12, htn, hld, hyperglycemia, low vit d       HPI:  [name] is a 72 y.o. male here for wellness exam; up to date               Also has GI f/u Dr Silverio Decamp next wk, then general surgury after.  Also has recurring sciatica following with NS after recent cortisone.  Pt denies chest pain, increased sob or doe, wheezing, orthopnea, PND, increased LE swelling, palpitations, dizziness or syncope.   Pt denies polydipsia, polyuria, or new focal neuro s/s.    Pt denies fever, wt loss, night sweats, loss of appetite, or other constitutional symptoms     Wt Readings from Last 3 Encounters:  11/13/22 227 lb (103 kg)  07/06/22 228 lb (103.4 kg)  07/06/22 226 lb (102.5 kg)   BP Readings from Last 3 Encounters:  11/13/22 124/76  10/02/22 (!) 142/70  07/12/22 (!) 157/84   Immunization History  Administered Date(s) Administered   Fluad Quad(high Dose 65+) 10/23/2022   Influenza Split 09/12/2021   Influenza Whole 09/23/2007, 08/06/2010, 08/22/2011   Influenza,inj,Quad PF,6+ Mos 09/21/2015, 07/14/2016   Influenza-Unspecified 09/08/2014, 08/10/2017, 08/19/2019   Moderna Sars-Covid-2 Vaccination 03/24/2020, 04/20/2020, 10/06/2020   Pneumococcal Conjugate-13 11/12/2020   Pneumococcal Polysaccharide-23 11/07/1999, 09/14/2017, 01/24/2018   RSV,unspecified 10/23/2022   Td 05/03/2005   Tdap 01/18/2017   Zoster Recombinat (Shingrix) 06/24/2021, 09/05/2021  There are no preventive care reminders to display for this patient.    Past Medical History:  Diagnosis Date   Allergic rhinitis    Anemia, pernicious    Anxiety and depression    sees Dr. [name]   Arthritis    B12 deficiency 01/23/2020   Catheter-associated urinary tract infection ([place])    Diverticulosis of colon    Elevated PSA    and hypogonadism, sees urologist   GERD (gastroesophageal reflux  disease)    hiatal hernia   Hx of blood transfusion reaction 1999   Hyperlipidemia    Hyperparathyroidism ([place])    Hyperplastic colon polyp    Hypertension    acei causes cough   IBS (irritable bowel syndrome)    Nephrolithiasis    Obstructive chronic bronchitis without exacerbation, followed by Dr. [name] 03/04/2009   ONO RA  Normal 07/28/11 6 min walk >569mno desat PFTs 08/08/11: DLCO 70% TLC 80%  FeV1 80%   Fef 25 75 56% with significant improvement after BD    [place]    after surgery in 1999   Pneumonia    PONV (postoperative nausea and vomiting)    Rectal fissure    Trigeminal neuralgia    has facial asymetry   Ventral hernia    Past Surgical History:  Procedure Laterality Date   ABDOMINAL SURGERY     Multiple   BRAIN SURGERY  01/2011   Trigeminal nerve[place]   tooth extraction, root canal   ESOPHAGEAL MANOMETRY N/A 04/28/2020   Procedure: ESOPHAGEAL MANOMETRY (EM);  Surgeon: NMauri Pole MD;  Location: [facility];  Service: Endoscopy;  Laterality: N/A;   HAND SURGERY  1973   Left   HERNIA REPAIR  1999   KNEE SURGERY  1997   Left   LITHOTRIPSY     Right   NISSEN FUNDOPLICATION  1448    complicated by gastric perforation, peritonitis, ventral nernia    RHIZOTOMY Right 11/17/2014   Procedure: RHIZOTOMY TO RIGHT V2,V3;  Surgeon: Erline Levine, MD;  Location: [facility];  Service: Neurosurgery;  Laterality: Right;  right   RHIZOTOMY Right 12/22/2014   Procedure: Right V2 and V3 trigeminal rhysolysis;  Surgeon: Erline Levine, MD;  Location: [facility];  Service: Neurosurgery;  Laterality: Right;  Right V2 and V3 trigeminal rhysolysis   RHIZOTOMY W/ RADIOFREQUENCY ABLATION  08/2017   Dr [name]   UPPER GASTROINTESTINAL ENDOSCOPY     VENTRAL HERNIA REPAIR  2004    reports that he quit smoking about 26 years ago. His smoking use included cigarettes. He has a 20.00 pack-year smoking history. He quit  smokeless tobacco use about 39 years ago.  His smokeless tobacco use included chew. He reports that he does not drink alcohol and does not use drugs. family history includes Colon polyps in his mother; Coronary artery disease in his mother; Diabetes in his brother, mother, and sister; Heart disease in his brother and brother; Hypertension in his brother. Allergies  Allergen Reactions   Dilantin [Phenytoin] Other (See Comments)    bradycardia   Dilaudid [Hydromorphone Hcl] Nausea And Vomiting   Sulfa Antibiotics Hives   Morphine    Ace Inhibitors Cough   Augmentin [Amoxicillin-Pot Clavulanate] Nausea And Vomiting   Codeine Nausea And Vomiting   Current Outpatient Medications on File Prior to Visit  Medication Sig Dispense Refill   acetaminophen (TYLENOL) 325 MG tablet Take 2 tablets (650 mg total) by mouth every 6 (six) hours as needed for mild pain, moderate pain or fever.     ALPRAZolam (XANAX) 1 MG tablet TAKE 1 TABLET BY MOUTH FOUR TIMES DAILY AS NEEDED FOR ANXIETY 120 tablet 2   carbamazepine (TEGRETOL XR) 100 MG 12 hr tablet Take 100-200 mg by mouth See admin instructions. '100mg'$  in the morning and '200mg'$  at night.     Cholecalciferol 50 MCG (2000 UT) TABS 1 tab by mouth once daily 30 tablet 99   cyanocobalamin (,VITAMIN B-12,) 1000 MCG/ML injection INJECT ONE ML in every 15 DAYS 6 mL 3   gabapentin (NEURONTIN) 400 MG capsule Take 400-800 mg by mouth See admin instructions. Taking '400mg'$  at 0800, 1400, then 2 capsules ('800mg'$ ) at bedtime.     lansoprazole (PREVACID) 30 MG capsule TAKE ONE CAPSULE BY MOUTH TWICE DAILY 180 capsule 1   polyethylene glycol powder (GLYCOLAX/MIRALAX) powder DISSOLVE 1 CAPFUL IN LIQUID EVERY DAY AS NEEDED FOR CONSTIPATION 527 g 2   Probiotic Product (ALIGN) 4 MG CAPS Take 4 mg by mouth daily.     sucralfate (CARAFATE) 1 g tablet TAKE 1 TABLET BY MOUTH FOUR TIMES DAILY with meals AND AT BEDTIME 120 tablet 2   tamsulosin (FLOMAX) 0.4 MG CAPS capsule Take 0.4 mg by  mouth at bedtime.     Zinc 220 (50 Zn) MG CAPS Take 220 mg by mouth daily.     No current facility-administered medications on file prior to visit.        ROS:  All others reviewed and negative.  Objective        PE:  BP 124/76 (BP Location: Left Arm, Patient Position: Sitting, Cuff Size: Large)   Pulse (!) 58   Temp 97.6 F (36.4 C) (Oral)   Ht '5\' 11"'$  (1.803 m)   Wt 227 lb (103 kg)   SpO2 95%   BMI 31.66 kg/m  Constitutional: Pt appears in NAD               HENT: Head: NCAT.                Right Ear: External ear normal.                 Left Ear: External ear normal.                Eyes: . Pupils are equal, round, and reactive to light. Conjunctivae and EOM are normal               Nose: without d/c or deformity               Neck: Neck supple. Gross normal ROM               Cardiovascular: Normal rate and regular rhythm.                 Pulmonary/Chest: Effort normal and breath sounds without rales or wheezing.                Abd:  Soft, NT, ND, + BS, no organomegaly               Neurological: Pt is alert. At baseline orientation, motor grossly intact               Skin: Skin is warm. No rashes, no other new lesions, LE edema - none               Psychiatric: Pt behavior is normal without agitation   Micro: none  Cardiac tracings I have personally interpreted today:  none  Pertinent Radiological findings (summarize): none   Lab Results  Component Value Date   WBC 7.0 11/11/2021   HGB 15.8 11/11/2021   HCT 46.5 11/11/2021   PLT 158.0 11/11/2021   GLUCOSE 87 11/11/2021   CHOL 159 11/11/2021   TRIG 111.0 11/11/2021   HDL 56.90 11/11/2021   LDLCALC 80 11/11/2021   ALT 10 11/11/2021   AST 14 11/11/2021   NA 135 11/11/2021   K 4.1 11/11/2021   CL 94 (L) 11/11/2021   CREATININE 0.89 11/11/2021   BUN 10 11/11/2021   CO2 31 11/11/2021   TSH 2.06 11/11/2021   PSA 10.82 (H) 02/02/2021   HGBA1C 5.5 11/11/2021   Assessment/Plan:  [name] is a 72 y.o. White or Caucasian [1] male with  has a past medical history of Allergic rhinitis, Anemia, pernicious, Anxiety and depression, Arthritis, B12 deficiency (01/23/2020), Catheter-associated urinary tract infection ([place]), Diverticulosis of colon, Elevated PSA, GERD (gastroesophageal reflux disease), blood transfusion reaction (1999), Hyperlipidemia, Hyperparathyroidism ([place]), Hyperplastic colon polyp, Hypertension, IBS (irritable bowel syndrome), Nephrolithiasis, Obstructive chronic bronchitis without exacerbation, followed by Dr. [name] (03/04/2009), Peritonitis ([place]), Pneumonia, PONV (postoperative nausea and vomiting), Rectal fissure, Trigeminal neuralgia, and Ventral hernia.  B12 deficiency Lab Results  Component Value Date   VITAMINB12 605 11/11/2021   Stable, cont oral replacement - b12 1000 mcg qd   Vitamin D deficiency Last vitamin D Lab Results  Component Value Date   VD25OH 32.86 11/11/2021   Low, reminded to start  oral replacement   Encounter for well adult exam with abnormal findings Age and sex appropriate education and counseling updated with regular exercise and diet Referrals for preventative services - none needed Immunizations addressed - none needed Smoking counseling  - none needed Evidence for depression or other mood disorder - none significant  Most recent labs reviewed. I have personally reviewed and have noted: 1) the patient's medical and social history 2) The patient's current medications and supplements 3) The patient's height, weight, and BMI have been recorded in the chart   Essential hypertension BP Readings from Last 3 Encounters:  11/13/22 124/76  10/02/22 (!) 142/70  07/12/22 (!) 157/84   Stable and improved, pt to continue medical treatment hyzaar 50-12.5 mg qd   HLD (hyperlipidemia) Lab Results  Component Value Date   [place] 80 11/11/2021   Uncontrolled, goal ldl < 70,, pt to continue current low chol diet, declines  statin for now   Hyperglycemia Lab Results  Component Value Date   HGBA1C 5.5 11/11/2021   Stable, pt to continue current medical treatment  - diet, wt control  Followup: Return in about 6 months (around 05/14/2023).  Cathlean Cower, MD 11/13/2022 12:52 PM [facility]

## 2022-11-13 NOTE — Patient Instructions (Signed)
Please take OTC Vitamin D3 at 2000 units per day, indefinitely  Please continue all other medications as before, and refills have been done if requested.  Please have the pharmacy call with any other refills you may need.  Please continue your efforts at being more active, low cholesterol diet, and weight control.  You are otherwise up to date with prevention measures today.  Please keep your appointments with your specialists as you may have planned  Please go to the LAB at the blood drawing area for the tests to be done  You will be contacted by phone if any changes need to be made immediately.  Otherwise, you will receive a letter about your results with an explanation, but please check with MyChart first.  Please remember to sign up for MyChart if you have not done so, as this will be important to you in the future with finding out test results, communicating by private email, and scheduling acute appointments online when needed.  Please make an Appointment to return in 6 months, or sooner if needed

## 2022-11-13 NOTE — Assessment & Plan Note (Signed)
BP Readings from Last 3 Encounters:  11/13/22 124/76  10/02/22 (!) 142/70  07/12/22 (!) 157/84   Stable and improved, pt to continue medical treatment hyzaar 50-12.5 mg qd

## 2022-11-13 NOTE — Assessment & Plan Note (Signed)
Lab Results  Component Value Date   HGBA1C 5.5 11/11/2021   Stable, pt to continue current medical treatment  - diet, wt control

## 2022-11-13 NOTE — Assessment & Plan Note (Signed)
Last vitamin D Lab Results  Component Value Date   VD25OH 32.86 11/11/2021   Low, reminded to start  oral replacement

## 2022-11-13 NOTE — Assessment & Plan Note (Signed)

## 2022-11-13 NOTE — Assessment & Plan Note (Signed)
Lab Results  Component Value Date   VITAMINB12 605 11/11/2021   Stable, cont oral replacement - b12 1000 mcg qd

## 2022-11-20 DIAGNOSIS — R350 Frequency of micturition: Secondary | ICD-10-CM | POA: Diagnosis not present

## 2022-11-24 ENCOUNTER — Ambulatory Visit: Payer: Medicare Other | Admitting: Nurse Practitioner

## 2022-11-24 ENCOUNTER — Encounter: Payer: Self-pay | Admitting: Nurse Practitioner

## 2022-11-24 VITALS — BP 126/70 | HR 85 | Ht 71.0 in | Wt 224.0 lb

## 2022-11-24 DIAGNOSIS — R142 Eructation: Secondary | ICD-10-CM

## 2022-11-24 DIAGNOSIS — R140 Abdominal distension (gaseous): Secondary | ICD-10-CM | POA: Diagnosis not present

## 2022-11-24 DIAGNOSIS — K219 Gastro-esophageal reflux disease without esophagitis: Secondary | ICD-10-CM | POA: Diagnosis not present

## 2022-11-24 MED ORDER — LANSOPRAZOLE 30 MG PO CPDR: 30.0000 mg | DELAYED_RELEASE_CAPSULE | Freq: Two times a day (BID) | ORAL | 1 refills | Status: DC

## 2022-11-24 MED ORDER — SUCRALFATE 1 G PO TABS: ORAL_TABLET | ORAL | 2 refills | Status: DC

## 2022-11-24 MED ORDER — FAMOTIDINE 20 MG PO TABS: 20.0000 mg | ORAL_TABLET | Freq: Every day | ORAL | 1 refills | Status: DC

## 2022-11-24 NOTE — Patient Instructions (Signed)
We have sent the following medications to your pharmacy for you to pick up at your convenience: Famotidine 20 mg, Carafate & Lansoprazole   Contact our office if left abdominal pain recurs.  Thank you for trusting me with your gastrointestinal care!   Carl Best, CRNP

## 2022-11-24 NOTE — Progress Notes (Signed)
11/24/2022 [name] [id number] 1951-04-19   Chief Complaint: Increased burping, left sided abdominal pain   History of Present Illness: [name] is a 72 year old male with a past medical history of anxiety, depression, arthritis, vitamin B12 deficiency, tubulovillous adenomatous colon polyp and GERD s/p Nissen fundoplication 7482 complicated by a perforation s/p redo surgery by Dr. Hassell Done 1999. Past Ventral hernia repair with recurrent SBO secondary to adhesions. Past cholecystectomy.  He presents today with complaints of persistent belching which can occur nonstop for several hours.  Sometimes, his burping is triggered after he takes his nighttime medications.  He stated Dexilant significantly reduced his belching in the past but he stopped taking it as it was no longer covered by his insurance.  He is taking Lansoprazole 30 mg twice daily and Carafate 1 g p.o. 3 times daily.  He also continues to have recurrent left upper quadrant pain since 08/2022 which has "eased up" over the past few days.  He gets full easily and has frequent abdominal bloat.  He typically eats 1 boiled egg and toast for breakfast, one half sandwich for lunch and chicken, potato and vegetable for dinner.  He endorses having pill dysphagia for the past 6 to 7 months.  He specifically has difficulty swallowing his gabapentin.  No difficulty swallowing solid foods or liquids.  He passes a brown mushy stool daily as long as he takes MiraLAX.  No greasy/oily or floating stools.  No rectal bleeding or black stools.  No weight loss, fever, sweats or chills.  His most recent EGD and colonoscopy were 08/24/2021.  The EGD identified reflux esophagitis, medium hiatal hernia and gastritis.  No evidence of H. pylori.  The colonoscopy identified 2 tubulovillous adenomatous polyps removed from the transverse colon and inflammatory polyp removed from the rectum.  He was advised to repeat a colonoscopy in 3  years.      Latest Ref Rng & Units 11/13/2022   11:51 AM 11/11/2021   10:06 AM 05/16/2021    1:27 AM  CBC  WBC 4.0 - 10.5 K/uL 8.0  7.0  7.5   Hemoglobin 13.0 - 17.0 g/dL 15.9  15.8  15.9   Hematocrit 39.0 - 52.0 % 46.9  46.5  45.1   Platelets 150.0 - 400.0 K/uL 159.0  158.0  141        Latest Ref Rng & Units 11/13/2022   11:51 AM 11/11/2021   10:06 AM 05/16/2021    1:27 AM  CMP  Glucose 70 - 99 mg/dL 91  87  99   BUN 6 - 23 mg/dL '8  10  6   '$ Creatinine 0.40 - 1.50 mg/dL 0.81  0.89  0.77   Sodium 135 - 145 mEq/L 133  135  136   Potassium 3.5 - 5.1 mEq/L 4.2  4.1  3.5   Chloride 96 - 112 mEq/L 93  94  101   CO2 19 - 32 mEq/L '31  31  26   '$ Calcium 8.4 - 10.5 mg/dL 9.3  9.4  8.5   Total Protein 6.0 - 8.3 g/dL 6.9  7.0    Total Bilirubin 0.2 - 1.2 mg/dL 0.7  0.7    Alkaline Phos 39 - 117 U/L 74  81    AST 0 - 37 U/L 12  14    ALT 0 - 53 U/L 8  10      Coronary CT 12/15/2021: 1. Coronary calcium score of 155. This was 53rd  percentile for age-, race-, and sex-matched controls. 2. Aorta: Mildly dilated aorta to 40 mm at the level of the main PA bifurcation. Aortic atherosclerosis is noted.  EGD 08/24/2021: LA grade B reflux esophagitis with no bleeding Medium size hiatal hernia Gastritis.  Biopsied. Normal examined duodenum  Colonoscopy 08/2021:   - Two 4 to 10 mm polyps in the transverse colon, removed with a cold snare. Resected and retrieved. - One 8 mm polyp in the rectum, removed with a hot snare. Resected and retrieved. - Moderate diverticulosis in the sigmoid colon, in the descending colon, in the transverse colon, in the ascending colon and in the cecum. - Non-bleeding external and internal hemorrhoids. - 3 year recall colonoscopy   1. Surgical [P], gastric - gastritis bx - GASTRIC ANTRAL AND OXYNTIC MUCOSA WITH MILD CHRONIC GASTRITIS - WARTHIN STARRY STAIN IS NEGATIVE FOR HELICOBACTER PYLORI 2. Surgical [P], colon, transverse, polyp (2) - TUBULOVILLOUS ADENOMA(S) -  NEGATIVE FOR HIGH-GRADE DYSPLASIA OR MALIGNANCY 3. Surgical [P], colon, rectum, polyp (1) - INFLAMMATORY POLYP - NEGATIVE FOR DYSPLASIA   Past Medical History:  Diagnosis Date   Allergic rhinitis    Anemia, pernicious    Anxiety and depression    sees Dr. [name]   Arthritis    B12 deficiency 01/23/2020   Catheter-associated urinary tract infection ([place])    Diverticulosis of colon    Elevated PSA    and hypogonadism, sees urologist   GERD (gastroesophageal reflux disease)    hiatal hernia   Hx of blood transfusion reaction 1999   Hyperlipidemia    Hyperparathyroidism ([place])    Hyperplastic colon polyp    Hypertension    acei causes cough   IBS (irritable bowel syndrome)    Nephrolithiasis    Obstructive chronic bronchitis without exacerbation, followed by Dr. [name] 03/04/2009   ONO RA  Normal 07/28/11 6 min walk >582mno desat PFTs 08/08/11: DLCO 70% TLC 80%  FeV1 80%   Fef 25 75 56% with significant improvement after BD    [place]itonitis ([place]    after surgery in 1999   Pneumonia    PONV (postoperative nausea and vomiting)    Rectal fissure    Trigeminal neuralgia    has facial asymetry   Ventral hernia    Past Surgical History:  Procedure Laterality Date   ABDOMINAL SURGERY     Multiple   BRAIN SURGERY  01/2011   Trigeminal nerve/Dr [facility]cer[facility]URGERY  2019   tooth extraction, root canal   ESOPHAGEAL MANOMETRY N/A 04/28/2020   Procedure: ESOPHAGEAL MANOMETRY (EM);  Surgeon: NMauri Pole MD;  Location: [facility];  Service: Endoscopy;  Laterality: N/A;   HAND SURGERY  1973   Left   HERNIA REPAIR  1999   KNEE SURGERY  1997   Left   LITHOTRIPSY     Right   NISSEN FUNDOPLICATION  16283   complicated by gastric perforation, peritonitis, ventral nernia    RHIZOTOMY Right 11/17/2014   Procedure: RHIZOTOMY TO RIGHT V2,V3;  Surgeon: [name] MD;  [facility];  Service: Neurosurgery;  Laterality: Right;  right   RHIZOTOMY  Right 12/22/2014   Procedure: Right V2 and V3 trigeminal rhysolysis;  Surgeon: [name] MD;  [facility];  Service: Neurosurgery;  Laterality: Right;  Right V2 and V3 trigeminal rhysolysis   RHIZOTOMY W/ RADIOFREQUENCY ABLATION  08/2017   Dr HMaryjean Ka  UPPER GASTROINTESTINAL ENDOSCOPY  VENTRAL HERNIA REPAIR  2004    Current Medications, Allergies, Past Medical History, Past Surgical History, Family History and Social History were reviewed in [employer] record.  Review of Systems:   Constitutional: Negative for fever, sweats, chills or weight loss.  Respiratory: Negative for shortness of breath.   Cardiovascular: Negative for chest pain, palpitations and leg swelling.  Gastrointestinal: See HPI.  Musculoskeletal: Negative for back pain or muscle aches.  Neurological: Negative for dizziness, headaches or paresthesias.   Physical Exam: BP 126/70   Pulse 85   Ht '5\' 11"'$  (1.803 m)   Wt 224 lb (101.6 kg)   BMI 31.24 kg/m   General: 72 year old male in no acute distress. Head: Normocephalic and atraumatic. Eyes: No scleral icterus. Conjunctiva pink . Ears: Normal auditory acuity. Mouth: Dentition intact. No ulcers or lesions.  Lungs: Clear throughout to auscultation. Heart: Regular rate and rhythm, no murmur. Abdomen: Soft, nontender and nondistended. No masses or hepatomegaly. Normal bowel sounds x 4 quadrants. Extensive midline scar. Punctate LUQ scar intact.  Rectal: Deferred.  Musculoskeletal: Symmetrical with no gross deformities. Extremities: No edema. Neurological: Alert oriented x 4. No focal deficits.  Psychological: Alert and cooperative. Normal mood and affect  Assessment and Recommendations:  54) 72 year old male with a history of GERD s/p Nissen fundoplication 4270 complicated by a perforation s/p redo surgery 1999 with chronic burping which has recently worsened.  -Add Famotidine '20mg'$  one po QHS -Continue Lansoprazole '30mg'$  one  po bid and Sucralfate 1gm po tid   2) Recurrent LUQ pain gastritis vs induced by suspected adhesions  -See plan in # 1 -Patient to contact office if LUQ pain worsens  -CTAP if LUQ pain worsens   3) History of a tubulovillous adenomatous polyp removed from the transverse colon 08/2021. Mother with history of colon polyps.  -Next colon polyp surveillance colonoscopy due 08/2024  4) Pill dysphagia -Barium swallow study with tablet discussed, patient declined for now.  Will monitor his symptoms and he will contact his office if his pill dysphagia worsens.

## 2022-11-29 DIAGNOSIS — K439 Ventral hernia without obstruction or gangrene: Secondary | ICD-10-CM | POA: Diagnosis not present

## 2022-12-11 DIAGNOSIS — M461 Sacroiliitis, not elsewhere classified: Secondary | ICD-10-CM | POA: Diagnosis not present

## 2022-12-13 ENCOUNTER — Other Ambulatory Visit: Payer: Self-pay | Admitting: Neurological Surgery

## 2022-12-13 DIAGNOSIS — M461 Sacroiliitis, not elsewhere classified: Secondary | ICD-10-CM

## 2022-12-19 ENCOUNTER — Ambulatory Visit
Admission: RE | Admit: 2022-12-19 | Discharge: 2022-12-19 | Disposition: A | Discharge status: Home / Self Care | Payer: Medicare Other | Source: Ambulatory Visit | Attending: Neurological Surgery | Admitting: Neurological Surgery

## 2022-12-19 DIAGNOSIS — M461 Sacroiliitis, not elsewhere classified: Secondary | ICD-10-CM | POA: Diagnosis not present

## 2022-12-19 MED ORDER — METHYLPREDNISOLONE ACETATE 40 MG/ML INJ SUSP (RADIOLOG
80.0000 mg | Freq: Once | INTRAMUSCULAR | Status: AC
  Administered 2022-12-19: 80 mg via INTRA_ARTICULAR

## 2022-12-19 MED ORDER — IOPAMIDOL (ISOVUE-M 200) INJECTION 41%
1.0000 mL | Freq: Once | INTRAMUSCULAR | Status: AC
  Administered 2022-12-19: 1 mL via INTRA_ARTICULAR

## 2023-01-03 DIAGNOSIS — M5416 Radiculopathy, lumbar region: Secondary | ICD-10-CM | POA: Diagnosis not present

## 2023-01-05 ENCOUNTER — Other Ambulatory Visit: Payer: Self-pay | Admitting: Neurological Surgery

## 2023-01-05 DIAGNOSIS — M5416 Radiculopathy, lumbar region: Secondary | ICD-10-CM

## 2023-01-11 ENCOUNTER — Ambulatory Visit
Admission: RE | Admit: 2023-01-11 | Discharge: 2023-01-11 | Disposition: A | Discharge status: Home / Self Care | Payer: Medicare Other | Source: Ambulatory Visit | Attending: Neurological Surgery | Admitting: Neurological Surgery

## 2023-01-11 ENCOUNTER — Other Ambulatory Visit: Payer: Self-pay | Admitting: Neurological Surgery

## 2023-01-11 DIAGNOSIS — M4727 Other spondylosis with radiculopathy, lumbosacral region: Secondary | ICD-10-CM | POA: Diagnosis not present

## 2023-01-11 DIAGNOSIS — M5416 Radiculopathy, lumbar region: Secondary | ICD-10-CM

## 2023-01-11 MED ORDER — METHYLPREDNISOLONE ACETATE 40 MG/ML INJ SUSP (RADIOLOG: 80.0000 mg | Freq: Once | INTRAMUSCULAR | Status: DC

## 2023-01-11 MED ORDER — IOPAMIDOL (ISOVUE-M 200) INJECTION 41%: 1.0000 mL | Freq: Once | INTRAMUSCULAR | Status: DC

## 2023-01-11 NOTE — Discharge Instructions (Signed)

## 2023-01-31 DIAGNOSIS — M545 Low back pain, unspecified: Secondary | ICD-10-CM | POA: Diagnosis not present

## 2023-01-31 DIAGNOSIS — M5416 Radiculopathy, lumbar region: Secondary | ICD-10-CM | POA: Diagnosis not present

## 2023-02-05 ENCOUNTER — Other Ambulatory Visit: Payer: Self-pay | Admitting: Internal Medicine

## 2023-02-05 NOTE — Telephone Encounter (Signed)
Donee erx 

## 2023-02-10 ENCOUNTER — Other Ambulatory Visit: Payer: Self-pay | Admitting: Nurse Practitioner

## 2023-04-05 ENCOUNTER — Other Ambulatory Visit: Payer: Self-pay | Admitting: Nurse Practitioner

## 2023-04-05 NOTE — Telephone Encounter (Signed)
Please advise 

## 2023-04-12 DIAGNOSIS — K439 Ventral hernia without obstruction or gangrene: Secondary | ICD-10-CM | POA: Diagnosis not present

## 2023-04-26 NOTE — Progress Notes (Signed)
Cardiology Office Note   Date:  05/07/2023   ID:  [name], DOB [birth year], MRN [id number]  PCP:  Corwin Levins, MD  Cardiologist:   Charlton Haws, MD   No chief complaint on file.     History of Present Illness: [name] is a 72 y.o. male who presents for f/U regarding chest pain. Referred by Dr Jonny Ruiz 12/2018   CRF;s include HTN and HLD. He has chronic GERD post Nissen fundoplication and recurrent hiatal hernia Taking Dexilant and Carafate He gets frequent burning pain in epigastric area and chest as well as burping and nausea. Definitely GI Overtones to pain Was hospitalized with another SBO 2/18-2/21/22 with decompression NG tube spontaneous resolution   Retired from [employer] has 3 kids. Brother had stents at his age No chest pain   Calcium score done 01/02/19 was 71 below average for age 63 th percentile with LDL running in the 80 range with diet Rx  He sees GI for B12 deficiency, GERD Prior Nissen Fundoplication with perforation needing revision in 1999 GB removal and ventral hernia with SBO from adhesions He has pill dysphagia Declined barium swallow  Last colon and EGD 08/24/21   No cardiac symptoms   Past Medical History:  Diagnosis Date   Allergic rhinitis    Anemia, pernicious    Anxiety and depression    sees Dr. Evelene Croon   Arthritis    B12 deficiency 01/23/2020   Catheter-associated urinary tract infection ([facility])    Diverticulosis of colon    Elevated PSA    and hypogonadism, sees urologist   GERD (gastroesophageal reflux disease)    hiatal hernia   Hx of blood transfusion reaction 1999   Hyperlipidemia    Hyperparathyroidism ([facility])    Hyperplastic colon polyp    Hypertension    acei causes cough   IBS (irritable bowel syndrome)    Nephrolithiasis    Obstructive chronic bronchitis without exacerbation, followed by Dr. Delford Field 03/04/2009   ONO RA  Normal 07/28/11 6 min walk >548m no desat PFTs 08/08/11: DLCO 70% TLC 80%  FeV1  80%   Fef 25 75 56% with significant improvement after BD    Peritonitis ([facility])    after surgery in 1999   Pneumonia    PONV (postoperative nausea and vomiting)    Rectal fissure    Trigeminal neuralgia    has facial asymetry   Ventral hernia     Past Surgical History:  Procedure Laterality Date   ABDOMINAL SURGERY     Multiple   BRAIN SURGERY  01/2011   Trigeminal nerve/Dr Venetia Maxon   CHOLECYSTECTOMY  1981   DENTAL SURGERY  2019   tooth extraction, root canal   ESOPHAGEAL MANOMETRY N/A 04/28/2020   Procedure: ESOPHAGEAL MANOMETRY (EM);  Surgeon: Napoleon Form, MD;  Location: [facility];  Service: Endoscopy;  Laterality: N/A;   HAND SURGERY  1973   Left   HERNIA REPAIR  1999   KNEE SURGERY  1997   Left   LITHOTRIPSY     Right   NISSEN FUNDOPLICATION  1999    complicated by gastric perforation, peritonitis, ventral nernia    RHIZOTOMY Right 11/17/2014   Procedure: RHIZOTOMY TO RIGHT V2,V3;  Surgeon: Maeola Harman, MD;  [facility];  Service: Neurosurgery;  Laterality: Right;  right   RHIZOTOMY Right 12/22/2014   Procedure: Right V2 and V3 trigeminal rhysolysis;  Surgeon: Maeola Harman, MD;  [facility];  Service: Neurosurgery;  Laterality: Right;  Right V2 and V3 trigeminal rhysolysis   RHIZOTOMY W/ RADIOFREQUENCY ABLATION  08/2017   Dr Ollen Bowl   UPPER GASTROINTESTINAL ENDOSCOPY     VENTRAL HERNIA REPAIR  2004     Current Outpatient Medications  Medication Sig Dispense Refill   acetaminophen (TYLENOL) 325 MG tablet Take 2 tablets (650 mg total) by mouth every 6 (six) hours as needed for mild pain, moderate pain or fever.     ALPRAZolam (XANAX) 1 MG tablet TAKE 1 TABLET BY MOUTH FOUR TIMES DAILY AS NEEDED FOR ANXIETY 120 tablet 2   carbamazepine (TEGRETOL XR) 100 MG 12 hr tablet Take 100-200 mg by mouth See admin instructions. 100mg  in the morning and 200mg  at night.     cyanocobalamin (VITAMIN B12) 1000 MCG/ML injection INJECT 1 ML every 15 DAYS 6 mL  3   famotidine (PEPCID) 20 MG tablet TAKE 1 TABLET BY MOUTH AT BEDTIME 30 tablet 1   finasteride (PROSCAR) 5 MG tablet Take 1 tablet (5 mg total) by mouth daily. 90 tablet 3   gabapentin (NEURONTIN) 400 MG capsule Take 400-800 mg by mouth See admin instructions. Taking 400mg  at 0800, 1400, then 2 capsules (800mg ) at bedtime.     lansoprazole (PREVACID) 30 MG capsule Take 1 capsule (30 mg total) by mouth 2 (two) times daily. 180 capsule 1   losartan-hydrochlorothiazide (HYZAAR) 50-12.5 MG tablet Take 1 tablet by mouth daily. 90 tablet 3   polyethylene glycol powder (GLYCOLAX/MIRALAX) powder DISSOLVE 1 CAPFUL IN LIQUID EVERY DAY AS NEEDED FOR CONSTIPATION 527 g 2   Probiotic Product (ALIGN) 4 MG CAPS Take 4 mg by mouth daily.     sucralfate (CARAFATE) 1 g tablet TAKE 1 TABLET BY MOUTH FOUR TIMES DAILY WITH meals AND AT BEDTIME 120 tablet 2   tamsulosin (FLOMAX) 0.4 MG CAPS capsule Take 0.4 mg by mouth at bedtime.     Cholecalciferol 50 MCG (2000 UT) TABS 1 tab by mouth once daily (Patient not taking: Reported on 05/07/2023) 30 tablet 99   potassium chloride SA (KLOR-CON M) 20 MEQ tablet Take 1 tablet (20 mEq total) by mouth 2 (two) times daily. (Patient not taking: Reported on 05/07/2023) 180 tablet 3   Zinc 220 (50 Zn) MG CAPS Take 220 mg by mouth daily. (Patient not taking: Reported on 05/07/2023)     No current facility-administered medications for this visit.    Allergies:   Dilantin [phenytoin], Dilaudid [hydromorphone hcl], Sulfa antibiotics, Morphine, Ace inhibitors, Augmentin [amoxicillin-pot clavulanate], and Codeine    Social History:  The patient  reports that he quit smoking about 26 years ago. His smoking use included cigarettes. He has a 20.00 pack-year smoking history. He quit smokeless tobacco use about 39 years ago.  His smokeless tobacco use included chew. He reports that he does not drink alcohol and does not use drugs.   Family History:  The patient's family history includes Colon  polyps in his mother; Coronary artery disease in his mother; Diabetes in his brother, mother, and sister; Heart disease in his brother and brother; Hypertension in his brother.    ROS:  Please see the history of present illness.   Otherwise, review of systems are positive for none.   All other systems are reviewed and negative.    PHYSICAL EXAM: VS:  BP (!) 160/96   Pulse 60   Ht 5\' 11"  (1.803 m)   Wt 227 lb (103 kg)   SpO2 96%   BMI 31.66 kg/m  , BMI  Body mass index is 31.66 kg/m. Affect appropriate Healthy:  appears stated age HEENT: normal Neck supple with no adenopathy JVP normal no bruits no thyromegaly Lungs clear with no wheezing and good diaphragmatic motion Heart:  S1/S2 no murmur, no rub, gallop or click PMI normal Abdomen: benighn, multiple previous surgeries  no bruit.  No HSM or HJR Distal pulses intact with no bruits No edema Neuro non-focal Skin warm and dry No muscular weakness    EKG:  05/07/2023 NSR normal ECG rate 67    Recent Labs: 11/13/2022: ALT 8; BUN 8; Creatinine, Ser 0.81; Hemoglobin 15.9; Platelets 159.0; Potassium 4.2; Sodium 133; TSH 1.89    Lipid Panel    Component Value Date/Time   CHOL 153 11/13/2022 1151   TRIG 109.0 11/13/2022 1151   HDL 52.20 11/13/2022 1151   CHOLHDL 3 11/13/2022 1151   VLDL 21.8 11/13/2022 1151   LDLCALC 79 11/13/2022 1151      Wt Readings from Last 3 Encounters:  05/07/23 227 lb (103 kg)  11/24/22 224 lb (101.6 kg)  11/13/22 227 lb (103 kg)      Other studies Reviewed: Additional studies/ records that were reviewed today include: Notes from primary Notes from GI , ECG.    ASSESSMENT AND PLAN:  1.  Chest Pain:  Atypical no need for ETT normal ECG Family history CAD with calcium score 71 / 47 th percentile continue risk factor modification   2.  GI: Hospitalized for SBO 2/18-2/22/ 22 slipped Nissen fundoplication and multiple previous attempts at ventral hernia repair F/U Dr Daphine Deutscher and GI [facility]  barium swallow Due for repeat colon for polyps October 2025   Continue Carafate, prevacid and pepcid  3.  HTN:  Well controlled.  Continue current medications and low sodium Dash type diet.    4.  HLD:  LDL 87 continue diet Rx  As calcium score is below average for age   62. COVID: mild resolved  Now vaccinated   6. Leg Pain: good pulses on exam no edema to suggest DVT Duplex 02/05/20 with no DVT f/u primary   7/ BPH:  on flomax and proscar  PSA 12 3 years ago most recent down to 4.07 f/u urology  Current medicines are reviewed at length with the patient today.  The patient does not have concerns regarding medicines.  The following changes have been made:  no change  Labs/ tests ordered today include: None   Orders Placed This Encounter  Procedures   EKG 12-Lead     Disposition:   FU with cardiology  In a year     Signed, Charlton Haws, MD  05/07/2023 10:17 AM    [facility] [address] [place], [place], [place]  [address] Phone: [phone]; Fax: [phone]

## 2023-05-03 ENCOUNTER — Other Ambulatory Visit: Payer: Self-pay | Admitting: Internal Medicine

## 2023-05-05 ENCOUNTER — Other Ambulatory Visit: Payer: Self-pay | Admitting: Internal Medicine

## 2023-05-07 ENCOUNTER — Ambulatory Visit: Payer: Medicare Other | Attending: Cardiovascular Disease | Admitting: Cardiovascular Disease

## 2023-05-07 ENCOUNTER — Encounter: Payer: Self-pay | Admitting: Cardiovascular Disease

## 2023-05-07 ENCOUNTER — Other Ambulatory Visit: Payer: Self-pay

## 2023-05-07 VITALS — BP 160/96 | HR 60 | Ht 71.0 in | Wt 227.0 lb

## 2023-05-07 DIAGNOSIS — R079 Chest pain, unspecified: Secondary | ICD-10-CM

## 2023-05-07 DIAGNOSIS — I10 Essential (primary) hypertension: Secondary | ICD-10-CM | POA: Diagnosis not present

## 2023-05-07 DIAGNOSIS — I2510 Atherosclerotic heart disease of native coronary artery without angina pectoris: Secondary | ICD-10-CM | POA: Diagnosis not present

## 2023-05-07 DIAGNOSIS — I700 Atherosclerosis of aorta: Secondary | ICD-10-CM

## 2023-05-07 NOTE — Patient Instructions (Signed)
Medication Instructions:  Your physician recommends that you continue on your current medications as directed. Please refer to the Current Medication list given to you today.  *If you need a refill on your cardiac medications before your next appointment, please call your pharmacy*  Lab Work: If you have labs (blood work) drawn today and your tests are completely normal, you will receive your results only by: MyChart Message (if you have MyChart) OR A paper copy in the mail If you have any lab test that is abnormal or we need to change your treatment, we will call you to review the results.  Testing/Procedures: None ordered today.  Follow-Up: At [facility], you and your health needs are our priority.  As part of our continuing mission to provide you with exceptional heart care, we have created designated Provider Care Teams.  These Care Teams include your primary Cardiologist (physician) and Advanced Practice Providers (APPs -  Physician Assistants and Nurse Practitioners) who all work together to provide you with the care you need, when you need it.  We recommend signing up for the patient portal called "MyChart".  Sign up information is provided on this After Visit Summary.  MyChart is used to connect with patients for Virtual Visits (Telemedicine).  Patients are able to view lab/test results, encounter notes, upcoming appointments, etc.  Non-urgent messages can be sent to your provider as well.   To learn more about what you can do with MyChart, go to [email or web address].    Your next appointment:   1 year(s)  Provider:   Peter Nishan, MD      

## 2023-05-14 DIAGNOSIS — M5416 Radiculopathy, lumbar region: Secondary | ICD-10-CM | POA: Diagnosis not present

## 2023-05-15 ENCOUNTER — Ambulatory Visit (INDEPENDENT_AMBULATORY_CARE_PROVIDER_SITE_OTHER): Payer: Medicare Other | Admitting: Internal Medicine

## 2023-05-15 ENCOUNTER — Encounter: Payer: Self-pay | Admitting: Internal Medicine

## 2023-05-15 VITALS — BP 136/82 | HR 60 | Temp 97.6°F | Ht 71.0 in | Wt 226.0 lb

## 2023-05-15 DIAGNOSIS — R972 Elevated prostate specific antigen [PSA]: Secondary | ICD-10-CM | POA: Diagnosis not present

## 2023-05-15 DIAGNOSIS — J309 Allergic rhinitis, unspecified: Secondary | ICD-10-CM | POA: Diagnosis not present

## 2023-05-15 DIAGNOSIS — R739 Hyperglycemia, unspecified: Secondary | ICD-10-CM

## 2023-05-15 DIAGNOSIS — E559 Vitamin D deficiency, unspecified: Secondary | ICD-10-CM | POA: Diagnosis not present

## 2023-05-15 DIAGNOSIS — E7800 Pure hypercholesterolemia, unspecified: Secondary | ICD-10-CM | POA: Diagnosis not present

## 2023-05-15 DIAGNOSIS — E538 Deficiency of other specified B group vitamins: Secondary | ICD-10-CM | POA: Diagnosis not present

## 2023-05-15 DIAGNOSIS — I10 Essential (primary) hypertension: Secondary | ICD-10-CM

## 2023-05-15 DIAGNOSIS — M545 Low back pain, unspecified: Secondary | ICD-10-CM | POA: Diagnosis not present

## 2023-05-15 LAB — LIPID PANEL
Cholesterol: 148 mg/dL (ref 0–200)
HDL: 53.9 mg/dL (ref >39.00)
LDL Cholesterol: 68 mg/dL (ref 0–99)
NonHDL: 94.13
Total CHOL/HDL Ratio: 3
Triglycerides: 129 mg/dL (ref 0.0–149.0)
VLDL: 25.8 mg/dL (ref 0.0–40.0)

## 2023-05-15 LAB — URINALYSIS, ROUTINE W REFLEX MICROSCOPIC
Bilirubin Urine: NEGATIVE
Hgb urine dipstick: NEGATIVE
Ketones, ur: NEGATIVE
Nitrite: NEGATIVE
RBC / HPF: NONE SEEN (ref 0–?)
Specific Gravity, Urine: 1.01 (ref 1.000–1.030)
Total Protein, Urine: NEGATIVE
Urine Glucose: NEGATIVE
Urobilinogen, UA: 0.2 (ref 0.0–1.0)
pH: 7 (ref 5.0–8.0)

## 2023-05-15 LAB — CBC WITH DIFFERENTIAL/PLATELET
Basophils Absolute: 0 10*3/uL (ref 0.0–0.1)
Basophils Relative: 0.6 % (ref 0.0–3.0)
Eosinophils Absolute: 0.1 10*3/uL (ref 0.0–0.7)
Eosinophils Relative: 0.7 % (ref 0.0–5.0)
HCT: 45.1 % (ref 39.0–52.0)
Hemoglobin: 15.4 g/dL (ref 13.0–17.0)
Lymphocytes Relative: 26.4 % (ref 12.0–46.0)
Lymphs Abs: 2.2 10*3/uL (ref 0.7–4.0)
MCHC: 34 g/dL (ref 30.0–36.0)
MCV: 91.9 fl (ref 78.0–100.0)
Monocytes Absolute: 0.6 10*3/uL (ref 0.1–1.0)
Monocytes Relative: 7.4 % (ref 3.0–12.0)
Neutro Abs: 5.3 10*3/uL (ref 1.4–7.7)
Neutrophils Relative %: 64.9 % (ref 43.0–77.0)
Platelets: 157 10*3/uL (ref 150.0–400.0)
RBC: 4.91 Mil/uL (ref 4.22–5.81)
RDW: 13.2 % (ref 11.5–15.5)
WBC: 8.2 10*3/uL (ref 4.0–10.5)

## 2023-05-15 LAB — HEPATIC FUNCTION PANEL
ALT: 10 U/L (ref 0–53)
AST: 13 U/L (ref 0–37)
Albumin: 4.2 g/dL (ref 3.5–5.2)
Alkaline Phosphatase: 77 U/L (ref 39–117)
Bilirubin, Direct: 0.1 mg/dL (ref 0.0–0.3)
Total Bilirubin: 0.6 mg/dL (ref 0.2–1.2)
Total Protein: 6.9 g/dL (ref 6.0–8.3)

## 2023-05-15 LAB — VITAMIN B12: Vitamin B-12: 770 pg/mL (ref 211–911)

## 2023-05-15 LAB — BASIC METABOLIC PANEL
BUN: 8 mg/dL (ref 6–23)
CO2: 31 mEq/L (ref 19–32)
Calcium: 9.3 mg/dL (ref 8.4–10.5)
Chloride: 95 mEq/L — ABNORMAL LOW (ref 96–112)
Creatinine, Ser: 0.81 mg/dL (ref 0.40–1.50)
GFR: 89.08 mL/min (ref >60.00)
Glucose, Bld: 95 mg/dL (ref 70–99)
Potassium: 4.3 mEq/L (ref 3.5–5.1)
Sodium: 133 mEq/L — ABNORMAL LOW (ref 135–145)

## 2023-05-15 LAB — HEMOGLOBIN A1C: Hgb A1c MFr Bld: 5.4 % (ref 4.6–6.5)

## 2023-05-15 LAB — TSH: TSH: 1.97 u[IU]/mL (ref 0.35–5.50)

## 2023-05-15 LAB — VITAMIN D 25 HYDROXY (VIT D DEFICIENCY, FRACTURES): VITD: 32.19 ng/mL (ref 30.00–100.00)

## 2023-05-15 LAB — PSA: PSA: 3.21 ng/mL (ref 0.10–4.00)

## 2023-05-15 MED ORDER — CYANOCOBALAMIN 1000 MCG/ML IJ SOLN: INTRAMUSCULAR | 3 refills | Status: DC

## 2023-05-15 MED ORDER — ALPRAZOLAM 1 MG PO TABS: ORAL_TABLET | ORAL | 2 refills | Status: DC

## 2023-05-15 MED ORDER — METHYLPREDNISOLONE ACETATE 80 MG/ML IJ SUSP
80.0000 mg | Freq: Once | INTRAMUSCULAR | Status: AC
  Administered 2023-05-15: 80 mg via INTRAMUSCULAR

## 2023-05-15 NOTE — Assessment & Plan Note (Signed)
Lab Results  Component Value Date   PSA 4.07 (H) 11/13/2022   PSA 10.82 (H) 02/02/2021   PSA 12.06 (H) 01/23/2020   Pt requests f/u today, overall improving, cont f/u urology as planned

## 2023-05-15 NOTE — Assessment & Plan Note (Signed)
Lab Results  Component Value Date   HGBA1C 5.5 11/13/2022   Stable, pt to continue current medical treatment - diet, wt control

## 2023-05-15 NOTE — Assessment & Plan Note (Signed)
Lab Results  Component Value Date   LDLCALC 79 11/13/2022   Stable, pt to continue low chol diet

## 2023-05-15 NOTE — Assessment & Plan Note (Signed)
Last vitamin D Lab Results  Component Value Date   VD25OH 34.71 11/13/2022   Low, to start oral replacement

## 2023-05-15 NOTE — Patient Instructions (Signed)
You had the steroid shot today  Please continue all other medications as before, and refills have been done if requested.  Please have the pharmacy call with any other refills you may need.  Please continue your efforts at being more active, low cholesterol diet, and weight control.  Please keep your appointments with your specialists as you may have planned  Please go to the LAB at the blood drawing area for the tests to be done  You will be contacted by phone if any changes need to be made immediately.  Otherwise, you will receive a letter about your results with an explanation, but please check with MyChart first.  Please remember to sign up for MyChart if you have not done so, as this will be important to you in the future with finding out test results, communicating by private email, and scheduling acute appointments online when needed.  Please make an Appointment to return in 6 months, or sooner if needed 

## 2023-05-15 NOTE — Assessment & Plan Note (Signed)
Chronic persistent stable, to f/u ortho as planned

## 2023-05-15 NOTE — Assessment & Plan Note (Signed)
BP Readings from Last 3 Encounters:  05/15/23 136/82  05/07/23 (!) 160/96  01/11/23 (!) 168/83   Stable, pt to continue medical treatment hyzaar 50 - 12.5 mg qd

## 2023-05-15 NOTE — Progress Notes (Signed)
Patient ID: [name], male   DOB: [birth year], 72 y.o.   MRN: [id number]        Chief Complaint: follow up anxiety, allergic rhinitis, elevated psa, chronic lbp, htn, hld, hyperglycemia, low vit d       HPI:  [name] is a 72 y.o. male Does have several wks ongoing nasal allergy symptoms with clearish congestion, itch and sneezing, without fever, pain, ST, cough, swelling or wheezing, despite good compliance with zyrtec and flonase.  Denies urinary symptoms such as dysuria, frequency, urgency, flank pain, hematuria or n/v, fever, chills.  Pt denies chest pain, increased sob or doe, wheezing, orthopnea, PND, palpitations, dizziness or syncope but does have trace bilateral leg swelling as before, better in the AM, worse in the PM after up all day.   Pt denies polydipsia, polyuria, or new focal neuro s/s.    Pt denies fever, wt loss, night sweats, loss of appetite, or other constitutional symptoms  Denies worsening depressive symptoms, suicidal ideation, or panic; has ongoing anxiety, asks for xanax refill .  Pt continues to have recurring LBP without change in severity, bowel or bladder change, fever, wt loss,  worsening LE pain/numbness/weakness, gait change or falls, but has persistent pain, non surgical for now per ortho appt yesterday       Wt Readings from Last 3 Encounters:  05/15/23 226 lb (102.5 kg)  05/07/23 227 lb (103 kg)  11/24/22 224 lb (101.6 kg)   BP Readings from Last 3 Encounters:  05/15/23 136/82  05/07/23 (!) 160/96  01/11/23 (!) 168/83         Past Medical History:  Diagnosis Date   Allergic rhinitis    Anemia, pernicious    Anxiety and depression    sees Dr. Evelene Croon   Arthritis    B12 deficiency 01/23/2020   Catheter-associated urinary tract infection (HCC)    Diverticulosis of colon    Elevated PSA    and hypogonadism, sees urologist   GERD (gastroesophageal reflux disease)    hiatal hernia   Hx of blood transfusion reaction 1999    Hyperlipidemia    Hyperparathyroidism (HCC)    Hyperplastic colon polyp    Hypertension    acei causes cough   IBS (irritable bowel syndrome)    Nephrolithiasis    Obstructive chronic bronchitis without exacerbation, followed by Dr. Delford Field 03/04/2009   ONO RA  Normal 07/28/11 6 min walk >54m no desat PFTs 08/08/11: DLCO 70% TLC 80%  FeV1 80%   Fef 25 75 56% with significant improvement after BD    Peritonitis (HCC)    after surgery in 1999   Pneumonia    PONV (postoperative nausea and vomiting)    Rectal fissure    Trigeminal neuralgia    has facial asymetry   Ventral hernia    Past Surgical History:  Procedure Laterality Date   ABDOMINAL SURGERY     Multiple   BRAIN SURGERY  01/2011   Trigeminal nerve/Dr Venetia Maxon   CHOLECYSTECTOMY  1981   DENTAL SURGERY  2019   tooth extraction, root canal   ESOPHAGEAL MANOMETRY N/A 04/28/2020   Procedure: ESOPHAGEAL MANOMETRY (EM);  Surgeon: Napoleon Form, MD;  Location: [facility];  Service: Endoscopy;  Laterality: N/A;   HAND SURGERY  1973   Left   HERNIA REPAIR  1999   KNEE SURGERY  1997   Left   LITHOTRIPSY     Right   NISSEN FUNDOPLICATION  1999    complicated by  gastric perforation, peritonitis, ventral nernia    RHIZOTOMY Right 11/17/2014   Procedure: RHIZOTOMY TO RIGHT V2,V3;  Surgeon: Maeola Harman, MD;  Location: [facility];  Service: Neurosurgery;  Laterality: Right;  right   RHIZOTOMY Right 12/22/2014   Procedure: Right V2 and V3 trigeminal rhysolysis;  Surgeon: Maeola Harman, MD;  Location: [facility];  Service: Neurosurgery;  Laterality: Right;  Right V2 and V3 trigeminal rhysolysis   RHIZOTOMY W/ RADIOFREQUENCY ABLATION  08/2017   Dr Ollen Bowl   UPPER GASTROINTESTINAL ENDOSCOPY     VENTRAL HERNIA REPAIR  2004    reports that he quit smoking about 26 years ago. His smoking use included cigarettes. He has a 20.00 pack-year smoking history. He quit smokeless tobacco use about 39 years ago.  His smokeless tobacco use  included chew. He reports that he does not drink alcohol and does not use drugs. family history includes Colon polyps in his mother; Coronary artery disease in his mother; Diabetes in his brother, mother, and sister; Heart disease in his brother and brother; Hypertension in his brother. Allergies  Allergen Reactions   Dilantin [Phenytoin] Other (See Comments)    bradycardia   Dilaudid [Hydromorphone Hcl] Nausea And Vomiting   Sulfa Antibiotics Hives   Morphine    Ace Inhibitors Cough   Augmentin [Amoxicillin-Pot Clavulanate] Nausea And Vomiting   Codeine Nausea And Vomiting   Current Outpatient Medications on File Prior to Visit  Medication Sig Dispense Refill   acetaminophen (TYLENOL) 325 MG tablet Take 2 tablets (650 mg total) by mouth every 6 (six) hours as needed for mild pain, moderate pain or fever.     carbamazepine (TEGRETOL XR) 100 MG 12 hr tablet Take 100-200 mg by mouth See admin instructions. 100mg  in the morning and 200mg  at night.     Cholecalciferol 50 MCG (2000 UT) TABS 1 tab by mouth once daily 30 tablet 99   famotidine (PEPCID) 20 MG tablet TAKE 1 TABLET BY MOUTH AT BEDTIME 30 tablet 1   finasteride (PROSCAR) 5 MG tablet Take 1 tablet (5 mg total) by mouth daily. 90 tablet 3   gabapentin (NEURONTIN) 400 MG capsule Take 400-800 mg by mouth See admin instructions. Taking 400mg  at 0800, 1400, then 2 capsules (800mg ) at bedtime.     lansoprazole (PREVACID) 30 MG capsule Take 1 capsule (30 mg total) by mouth 2 (two) times daily. 180 capsule 1   losartan-hydrochlorothiazide (HYZAAR) 50-12.5 MG tablet Take 1 tablet by mouth daily. 90 tablet 3   polyethylene glycol powder (GLYCOLAX/MIRALAX) powder DISSOLVE 1 CAPFUL IN LIQUID EVERY DAY AS NEEDED FOR CONSTIPATION 527 g 2   potassium chloride SA (KLOR-CON M) 20 MEQ tablet Take 1 tablet (20 mEq total) by mouth 2 (two) times daily. 180 tablet 3   Probiotic Product (ALIGN) 4 MG CAPS Take 4 mg by mouth daily.     sucralfate (CARAFATE) 1  g tablet TAKE 1 TABLET BY MOUTH FOUR TIMES DAILY WITH meals AND AT BEDTIME 120 tablet 2   tamsulosin (FLOMAX) 0.4 MG CAPS capsule Take 0.4 mg by mouth at bedtime.     Zinc 220 (50 Zn) MG CAPS Take 220 mg by mouth daily.     No current facility-administered medications on file prior to visit.        ROS:  All others reviewed and negative.  Objective        PE:  BP 136/82 (BP Location: Right Arm, Patient Position: Sitting, Cuff Size: Normal)   Pulse 60  Temp 97.6 F (36.4 C) (Oral)   Ht 5\' 11"  (1.803 m)   Wt 226 lb (102.5 kg)   SpO2 96%   BMI 31.52 kg/m                 Constitutional: Pt appears in NAD               HENT: Head: NCAT.                Right Ear: External ear normal.                 Left Ear: External ear normal.                Eyes: . Pupils are equal, round, and reactive to light. Conjunctivae and EOM are normal               Nose: without d/c or deformity               Neck: Neck supple. Gross normal ROM               Cardiovascular: Normal rate and regular rhythm.                 Pulmonary/Chest: Effort normal and breath sounds without rales or wheezing.                Abd:  Soft, NT, ND, + BS, no organomegaly               Neurological: Pt is alert. At baseline orientation, motor grossly intact               Skin: Skin is warm. No rashes, no other new lesions, LE edema - trace edema bilateral               Psychiatric: Pt behavior is normal without agitation   Micro: none  Cardiac tracings I have personally interpreted today:  none  Pertinent Radiological findings (summarize): none   Lab Results  Component Value Date   WBC 8.0 11/13/2022   HGB 15.9 11/13/2022   HCT 46.9 11/13/2022   PLT 159.0 11/13/2022   GLUCOSE 91 11/13/2022   CHOL 153 11/13/2022   TRIG 109.0 11/13/2022   HDL 52.20 11/13/2022   LDLCALC 79 11/13/2022   ALT 8 11/13/2022   AST 12 11/13/2022   NA 133 (L) 11/13/2022   K 4.2 11/13/2022   CL 93 (L) 11/13/2022   CREATININE 0.81  11/13/2022   BUN 8 11/13/2022   CO2 31 11/13/2022   TSH 1.89 11/13/2022   PSA 4.07 (H) 11/13/2022   HGBA1C 5.5 11/13/2022   Assessment/Plan:  Terion Hedman is a 72 y.o. White or Caucasian [1] male with  has a past medical history of Allergic rhinitis, Anemia, pernicious, Anxiety and depression, Arthritis, B12 deficiency (01/23/2020), Catheter-associated urinary tract infection (HCC), Diverticulosis of colon, Elevated PSA, GERD (gastroesophageal reflux disease), blood transfusion reaction (1999), Hyperlipidemia, Hyperparathyroidism (HCC), Hyperplastic colon polyp, Hypertension, IBS (irritable bowel syndrome), Nephrolithiasis, Obstructive chronic bronchitis without exacerbation, followed by Dr. Delford Field (03/04/2009), Peritonitis (HCC), Pneumonia, PONV (postoperative nausea and vomiting), Rectal fissure, Trigeminal neuralgia, and Ventral hernia.  Allergic rhinitis Mild to mod, for cont anithist and flonase, also for depomedrol IM 80 mg,  to f/u any worsening symptoms or concerns  B12 deficiency Lab Results  Component Value Date   VITAMINB12 689 11/13/2022   Stable, cont oral replacement - b12 1000 mcg qd   Elevated PSA Lab Results  Component Value Date   PSA 4.07 (H) 11/13/2022   PSA 10.82 (H) 02/02/2021   PSA 12.06 (H) 01/23/2020   Pt requests f/u today, overall improving, cont f/u urology as planned  Essential hypertension BP Readings from Last 3 Encounters:  05/15/23 136/82  05/07/23 (!) 160/96  01/11/23 (!) 168/83   Stable, pt to continue medical treatment hyzaar 50 - 12.5 mg qd   HLD (hyperlipidemia) Lab Results  Component Value Date   LDLCALC 79 11/13/2022   Stable, pt to continue low chol diet   Hyperglycemia Lab Results  Component Value Date   HGBA1C 5.5 11/13/2022   Stable, pt to continue current medical treatment - diet, wt control   Vitamin D deficiency Last vitamin D Lab Results  Component Value Date   VD25OH 34.71 11/13/2022   Low, to  start oral replacement   Low back pain Chronic persistent stable, to f/u ortho as planned Followup: Return in about 6 months (around 11/15/2023).  Oliver Barre, MD 05/15/2023 1:21 PM [facility]

## 2023-05-15 NOTE — Assessment & Plan Note (Signed)
Mild to mod, for cont anithist and flonase, also for depomedrol IM 80 mg,  to f/u any worsening symptoms or concerns

## 2023-05-15 NOTE — Progress Notes (Signed)
The test results show that your current treatment is OK, as the tests are stable, including the PSA.  Please continue the same plan.  There is no other need for change of treatment or further evaluation based on these results, at this time.  thanks

## 2023-05-15 NOTE — Assessment & Plan Note (Signed)
Lab Results  Component Value Date   VITAMINB12 689 11/13/2022   Stable, cont oral replacement - b12 1000 mcg qd

## 2023-05-17 DIAGNOSIS — K219 Gastro-esophageal reflux disease without esophagitis: Secondary | ICD-10-CM | POA: Diagnosis not present

## 2023-05-17 DIAGNOSIS — Z8719 Personal history of other diseases of the digestive system: Secondary | ICD-10-CM | POA: Diagnosis not present

## 2023-05-17 DIAGNOSIS — K432 Incisional hernia without obstruction or gangrene: Secondary | ICD-10-CM | POA: Diagnosis not present

## 2023-05-21 DIAGNOSIS — R350 Frequency of micturition: Secondary | ICD-10-CM | POA: Diagnosis not present

## 2023-06-04 ENCOUNTER — Other Ambulatory Visit: Payer: Self-pay | Admitting: Nurse Practitioner

## 2023-06-04 NOTE — Telephone Encounter (Signed)
Please advise 

## 2023-07-05 ENCOUNTER — Ambulatory Visit (INDEPENDENT_AMBULATORY_CARE_PROVIDER_SITE_OTHER): Payer: Medicare Other

## 2023-07-05 VITALS — Ht 71.0 in | Wt 226.0 lb

## 2023-07-05 DIAGNOSIS — Z0000 Encounter for general adult medical examination without abnormal findings: Secondary | ICD-10-CM

## 2023-07-05 NOTE — Progress Notes (Signed)
Subjective:   [name] is a 72 y.o. male who presents for Medicare Annual/Subsequent preventive examination.  Visit Complete: Virtual  I connected with  [name] on 07/05/23 by a audio enabled telemedicine application and verified that I am speaking with the correct person using two identifiers.  Patient Location: Home  Provider Location: Office/Clinic  I discussed the limitations of evaluation and management by telemedicine. The patient expressed understanding and agreed to proceed.  Vital Signs: Because this visit was a virtual/telehealth visit, some criteria may be missing or patient reported. Any vitals not documented were not able to be obtained and vitals that have been documented are patient reported.    Review of Systems   Cardiac Risk Factors include: advanced age (>58men, >50 women);male gender;hypertension;Other (see comment);dyslipidemia;obesity (BMI >30kg/m2), Risk factor comments: Aortic Atherosclerosis, CAP, BPH     Objective:    Today's Vitals   07/05/23 0814  Weight: 226 lb (102.5 kg)  Height: 5\' 11"  (1.803 m)   Body mass index is 31.52 kg/m.     07/05/2023    8:21 AM 07/05/2022    1:45 PM 12/24/2020    3:56 PM 12/07/2017    1:50 PM 07/27/2017   10:11 PM 07/21/2017   10:35 AM 03/05/2017    1:32 PM  Advanced Directives  Does Patient Have a Medical Advance Directive? Yes No No No No No No  Type of Media planner of Healthcare Power of Attorney in Chart? No - copy requested        Would patient like information on creating a medical advance directive?  No - Patient declined No - Patient declined        Current Medications (verified) Outpatient Encounter Medications as of 07/05/2023  Medication Sig   acetaminophen (TYLENOL) 325 MG tablet Take 2 tablets (650 mg total) by mouth every 6 (six) hours as needed for mild pain, moderate pain or fever.   ALPRAZolam (XANAX) 1 MG tablet TAKE 1  TABLET BY MOUTH FOUR TIMES DAILY AS NEEDED FOR ANXIETY   carbamazepine (TEGRETOL XR) 100 MG 12 hr tablet Take 100-200 mg by mouth See admin instructions. 100mg  in the morning and 200mg  at night.   Cholecalciferol 50 MCG (2000 UT) TABS 1 tab by mouth once daily   cyanocobalamin (VITAMIN B12) 1000 MCG/ML injection INJECT 1 ML every 7 days   famotidine (PEPCID) 20 MG tablet TAKE 1 TABLET BY MOUTH AT BEDTIME   finasteride (PROSCAR) 5 MG tablet Take 1 tablet (5 mg total) by mouth daily.   gabapentin (NEURONTIN) 400 MG capsule Take 400-800 mg by mouth See admin instructions. Taking 400mg  at 0800, 1400, then 2 capsules (800mg ) at bedtime.   lansoprazole (PREVACID) 30 MG capsule TAKE ONE CAPSULE BY MOUTH TWICE DAILY   losartan-hydrochlorothiazide (HYZAAR) 50-12.5 MG tablet Take 1 tablet by mouth daily.   polyethylene glycol powder (GLYCOLAX/MIRALAX) powder DISSOLVE 1 CAPFUL IN LIQUID EVERY DAY AS NEEDED FOR CONSTIPATION   potassium chloride SA (KLOR-CON M) 20 MEQ tablet Take 1 tablet (20 mEq total) by mouth 2 (two) times daily.   Probiotic Product (ALIGN) 4 MG CAPS Take 4 mg by mouth daily.   sucralfate (CARAFATE) 1 g tablet TAKE 1 TABLET BY MOUTH FOUR TIMES DAILY WITH meals AND AT BEDTIME   tamsulosin (FLOMAX) 0.4 MG CAPS capsule Take 0.4 mg by mouth at bedtime.   Zinc 220 (50 Zn) MG CAPS Take 220 mg by  mouth daily. (Patient not taking: Reported on 07/05/2023)   No facility-administered encounter medications on file as of 07/05/2023.    Allergies (verified) Dilantin [phenytoin], Dilaudid [hydromorphone hcl], Sulfa antibiotics, Morphine, Ace inhibitors, Augmentin [amoxicillin-pot clavulanate], and Codeine   History: Past Medical History:  Diagnosis Date   Allergic rhinitis    Anemia, pernicious    Anxiety and depression    sees Dr. Evelene Croon   Arthritis    B12 deficiency 01/23/2020   Catheter-associated urinary tract infection (HCC)    Diverticulosis of colon    Elevated PSA    and hypogonadism,  sees urologist   GERD (gastroesophageal reflux disease)    hiatal hernia   Hx of blood transfusion reaction 1999   Hyperlipidemia    Hyperparathyroidism (HCC)    Hyperplastic colon polyp    Hypertension    acei causes cough   IBS (irritable bowel syndrome)    Nephrolithiasis    Obstructive chronic bronchitis without exacerbation, followed by Dr. Delford Field 03/04/2009   ONO RA  Normal 07/28/11 6 min walk >578m no desat PFTs 08/08/11: DLCO 70% TLC 80%  FeV1 80%   Fef 25 75 56% with significant improvement after BD    Peritonitis (HCC)    after surgery in 1999   Pneumonia    PONV (postoperative nausea and vomiting)    Rectal fissure    Trigeminal neuralgia    has facial asymetry   Ventral hernia    Past Surgical History:  Procedure Laterality Date   ABDOMINAL SURGERY     Multiple   BRAIN SURGERY  01/2011   Trigeminal nerve/Dr Venetia Maxon   CHOLECYSTECTOMY  1981   DENTAL SURGERY  2019   tooth extraction, root canal   ESOPHAGEAL MANOMETRY N/A 04/28/2020   Procedure: ESOPHAGEAL MANOMETRY (EM);  Surgeon: Napoleon Form, MD;  Location: [facility];  Service: Endoscopy;  Laterality: N/A;   HAND SURGERY  1973   Left   HERNIA REPAIR  1999   KNEE SURGERY  1997   Left   LITHOTRIPSY     Right   NISSEN FUNDOPLICATION  1999    complicated by gastric perforation, peritonitis, ventral nernia    RHIZOTOMY Right 11/17/2014   Procedure: RHIZOTOMY TO RIGHT V2,V3;  Surgeon: Maeola Harman, MD;  Location: [facility];  Service: Neurosurgery;  Laterality: Right;  right   RHIZOTOMY Right 12/22/2014   Procedure: Right V2 and V3 trigeminal rhysolysis;  Surgeon: Maeola Harman, MD;  Location: [facility];  Service: Neurosurgery;  Laterality: Right;  Right V2 and V3 trigeminal rhysolysis   RHIZOTOMY W/ RADIOFREQUENCY ABLATION  08/2017   Dr Ollen Bowl   UPPER GASTROINTESTINAL ENDOSCOPY     VENTRAL HERNIA REPAIR  2004   Family History  Problem Relation Age of Onset   Colon polyps Mother    Diabetes Mother     Coronary artery disease Mother        CABG   Diabetes Sister    Diabetes Brother    Hypertension Brother    Heart disease Brother    Heart disease Brother    Colon cancer Neg Hx    Esophageal cancer Neg Hx    Stomach cancer Neg Hx    Pancreatic cancer Neg Hx    Social History   Socioeconomic History   Marital status: Married    Spouse name: [place] Sink   Number of children: 2   Years of education: Not on file   Highest education level: Not on file  Occupational History  Occupation: Disabled - psych  Tobacco Use   Smoking status: Former    Current packs/day: 0.00    Average packs/day: 1 pack/day for 20.0 years (20.0 ttl pk-yrs)    Types: Cigarettes    Start date: 11/06/1976    Quit date: 11/06/1996    Years since quitting: 26.6   Smokeless tobacco: Former    Types: Chew    Quit date: 11/07/1983  Vaping Use   Vaping status: Never Used  Substance and Sexual Activity   Alcohol use: No   Drug use: No   Sexual activity: Yes    Partners: Female  Other Topics Concern   Not on file  Social History Narrative   Daily Caffeine Use:  Yes         Social Determinants of Health   Financial Resource Strain: Medium Risk (07/05/2023)   Overall Financial Resource Strain (CARDIA)    Difficulty of Paying Living Expenses: Somewhat hard  Food Insecurity: No Food Insecurity (07/05/2023)   Hunger Vital Sign    Worried About Running Out of Food in the Last Year: Never true    Ran Out of Food in the Last Year: Never true  Transportation Needs: No Transportation Needs (07/05/2023)   PRAPARE - Administrator, Civil Service (Medical): No    Lack of Transportation (Non-Medical): No  Physical Activity: Sufficiently Active (07/05/2023)   Exercise Vital Sign    Days of Exercise per Week: 7 days    Minutes of Exercise per Session: 40 min  Stress: No Stress Concern Present (07/05/2023)   [employer] of Occupational Health - Occupational Stress Questionnaire    Feeling of Stress : Not at  all  Social Connections: Moderately Isolated (07/05/2023)   Social Connection and Isolation Panel [NHANES]    Frequency of Communication with Friends and Family: More than three times a week    Frequency of Social Gatherings with Friends and Family: Never    Attends Religious Services: Never    Diplomatic Services operational officer: No    Attends Engineer, structural: Never    Marital Status: Married    Tobacco Counseling Counseling given: Not Answered   Clinical Intake:  Pre-visit preparation completed: Yes  Pain : No/denies pain     BMI - recorded: 31.52 Nutritional Status: BMI > 30  Obese Diabetes: No  How often do you need to have someone help you when you read instructions, pamphlets, or other written materials from your doctor or pharmacy?: 1 - Never  Interpreter Needed?: No  Information entered by :: Brenedell Elois Averitt, RMA   Activities of Daily Living    07/05/2023    8:18 AM  In your present state of health, do you have any difficulty performing the following activities:  Hearing? 0  Vision? 0  Difficulty concentrating or making decisions? 0  Walking or climbing stairs? 0  Dressing or bathing? 0  Doing errands, shopping? 0  Preparing Food and eating ? N  Using the Toilet? N  In the past six months, have you accidently leaked urine? N  Do you have problems with loss of bowel control? N  Managing your Medications? N  Managing your Finances? N  Housekeeping or managing your Housekeeping? N    Patient Care Team: Corwin Levins, MD as PCP - General (Internal Medicine) Wendall Stade, MD as PCP - Cardiology (Cardiology) Ihor Gully, MD (Inactive) (Urology) Hart Carwin, MD (Inactive) (Gastroenterology) Milagros Evener, MD (Psychiatry) Adam Phenix, DPM (Podiatry)  Janalyn Harder, MD (Inactive) (Dermatology) Luretha Murphy, MD (General Surgery) Mateo Flow, MD as Consulting Physician (Ophthalmology) [facility]  (Ophthalmology)  Indicate any recent Medical Services you may have received from other than [facility] providers in the past year (date may be approximate).     Assessment:   This is a routine wellness examination for [name].  Hearing/Vision screen Hearing Screening - Comments:: Denies hearing difficulties   Vision Screening - Comments:: Wear eyeglasses for reading  Dietary issues and exercise activities discussed:     Goals Addressed             This Visit's Progress    <enter goal here>   On track    Stay healthy      Depression Screen    07/05/2023    8:26 AM 05/15/2023   10:17 AM 11/13/2022   11:26 AM 11/13/2022   10:52 AM 07/05/2022    1:44 PM 05/12/2022    9:57 AM 11/11/2021    8:57 AM  PHQ 2/9 Scores  PHQ - 2 Score 0 0 0 0 0 0 0  PHQ- 9 Score 0   3 0 0     Fall Risk    07/05/2023    8:21 AM 05/15/2023   10:17 AM 11/13/2022   11:26 AM 11/13/2022   10:52 AM 07/05/2022    1:46 PM  Fall Risk   Falls in the past year? 0 0 0 0 0  Number falls in past yr: 0 0 0  0  Injury with Fall? 0 0 0 0 0  Risk for fall due to : No Fall Risks No Fall Risks  No Fall Risks No Fall Risks  Follow up Falls prevention discussed;Falls evaluation completed Falls evaluation completed  Falls evaluation completed Falls prevention discussed    MEDICARE RISK AT HOME: Medicare Risk at Home Any stairs in or around the home?: Yes (2 steps in front and back of the house) If so, are there any without handrails?: Yes Home free of loose throw rugs in walkways, pet beds, electrical cords, etc?: Yes Adequate lighting in your home to reduce risk of falls?: Yes Life alert?: Yes Use of a cane, walker or w/c?: No Grab bars in the bathroom?: No Shower chair or bench in shower?: No Elevated toilet seat or a handicapped toilet?: Yes  TIMED UP AND GO:  Was the test performed?  No    Cognitive Function:        07/05/2023    8:22 AM 07/05/2022    1:47 PM  6CIT Screen  What Year? 0 points 0 points  What  month? 0 points 0 points  What time? 0 points 0 points  Count back from 20 0 points 0 points  Months in reverse 0 points 0 points  Repeat phrase 0 points 0 points  Total Score 0 points 0 points    Immunizations Immunization History  Administered Date(s) Administered   Fluad Quad(high Dose 65+) 10/23/2022   Influenza Split 09/12/2021   Influenza Whole 09/23/2007, 08/06/2010, 08/22/2011   Influenza,inj,Quad PF,6+ Mos 09/21/2015, 07/14/2016   Influenza-Unspecified 09/08/2014, 08/10/2017, 08/19/2019   Moderna Sars-Covid-2 Vaccination 03/24/2020, 04/20/2020, 10/06/2020   Pneumococcal Conjugate-13 11/12/2020   Pneumococcal Polysaccharide-23 11/07/1999, 09/14/2017, 01/24/2018   RSV,unspecified 10/23/2022   Td 05/03/2005   Tdap 01/18/2017   Zoster Recombinant(Shingrix) 06/24/2021, 09/05/2021    TDAP status: Up to date  Flu Vaccine status: Due, Education has been provided regarding the importance of this vaccine. Advised may receive this vaccine  at local pharmacy or Health Dept. Aware to provide a copy of the vaccination record if obtained from local pharmacy or Health Dept. Verbalized acceptance and understanding.  Pneumococcal vaccine status: Up to date  Covid-19 vaccine status: Completed vaccines  Qualifies for Shingles Vaccine? Yes   Zostavax completed Yes   Shingrix Completed?: Yes  Screening Tests Health Maintenance  Topic Date Due   INFLUENZA VACCINE  06/07/2023   Medicare Annual Wellness (AWV)  07/04/2024   Colonoscopy  08/24/2024   DTaP/Tdap/Td (3 - Td or Tdap) 01/19/2027   Pneumonia Vaccine 16+ Years old  Completed   Hepatitis C Screening  Completed   Zoster Vaccines- Shingrix  Completed   HPV VACCINES  Aged Out   COVID-19 Vaccine  Discontinued    Health Maintenance  Health Maintenance Due  Topic Date Due   INFLUENZA VACCINE  06/07/2023    Colorectal cancer screening: Type of screening: Colonoscopy. Completed 08/24/2021. Repeat every 3 years  Lung Cancer  Screening: (Low Dose CT Chest recommended if Age 33-80 years, 20 pack-year currently smoking OR have quit w/in 15years.) does not qualify.   Lung Cancer Screening Referral: N/a  Additional Screening:  Hepatitis C Screening: does qualify; Completed 01/06/2014  Vision Screening: Recommended annual ophthalmology exams for early detection of glaucoma and other disorders of the eye. Is the patient up to date with their annual eye exam?  Yes  Who is the provider or what is the name of the office in which the patient attends annual eye exams? [facility], [place] If pt is not established with a provider, would they like to be referred to a provider to establish care? No .   Dental Screening: Recommended annual dental exams for proper oral hygiene   Community Resource Referral / Chronic Care Management: CRR required this visit?  No   CCM required this visit?  No     Plan:     I have personally reviewed and noted the following in the patient's chart:   Medical and social history Use of alcohol, tobacco or illicit drugs  Current medications and supplements including opioid prescriptions. Patient is not currently taking opioid prescriptions. Functional ability and status Nutritional status Physical activity Advanced directives List of other physicians Hospitalizations, surgeries, and ER visits in previous 12 months Vitals Screenings to include cognitive, depression, and falls Referrals and appointments  In addition, I have reviewed and discussed with patient certain preventive protocols, quality metrics, and best practice recommendations. A written personalized care plan for preventive services as well as general preventive health recommendations were provided to patient.     Cloa Bushong L Mikaya Bunner, CMA   07/05/2023   After Visit Summary: (MyChart) Due to this being a telephonic visit, the after visit summary with patients personalized plan was offered to patient via  MyChart   Nurse Notes: Patient has an annual eye exam in October.  He will soon be due for his Flu vaccine.  Patient had no concerns to address today.

## 2023-07-05 NOTE — Patient Instructions (Signed)
Mr. [name] , Thank you for taking time to come for your Medicare Wellness Visit. I appreciate your ongoing commitment to your health goals. Please review the following plan we discussed and let me know if I can assist you in the future.   Referrals/Orders/Follow-Ups/Clinician Recommendations: Keep up the good work and it was nice talking with you today.  This is a list of the screening recommended for you and due dates:  Health Maintenance  Topic Date Due   Flu Shot  06/07/2023   Medicare Annual Wellness Visit  07/04/2024   Colon Cancer Screening  08/24/2024   DTaP/Tdap/Td vaccine (3 - Td or Tdap) 01/19/2027   Pneumonia Vaccine  Completed   Hepatitis C Screening  Completed   Zoster (Shingles) Vaccine  Completed   HPV Vaccine  Aged Out   COVID-19 Vaccine  Discontinued    Advanced directives: (Copy Requested) Please bring a copy of your health care power of attorney and living will to the office to be added to your chart at your convenience.  Next Medicare Annual Wellness Visit scheduled for next year: Yes

## 2023-07-31 ENCOUNTER — Other Ambulatory Visit: Payer: Self-pay | Admitting: Nurse Practitioner

## 2023-09-16 ENCOUNTER — Other Ambulatory Visit: Payer: Self-pay | Admitting: Nurse Practitioner

## 2023-10-12 DIAGNOSIS — H00015 Hordeolum externum left lower eyelid: Secondary | ICD-10-CM | POA: Diagnosis not present

## 2023-10-12 DIAGNOSIS — H5712 Ocular pain, left eye: Secondary | ICD-10-CM | POA: Diagnosis not present

## 2023-10-17 ENCOUNTER — Other Ambulatory Visit: Payer: Self-pay | Admitting: Nurse Practitioner

## 2023-10-20 ENCOUNTER — Other Ambulatory Visit: Payer: Self-pay | Admitting: Internal Medicine

## 2023-10-22 NOTE — Telephone Encounter (Signed)
Done erx 

## 2023-11-12 ENCOUNTER — Other Ambulatory Visit: Payer: Self-pay

## 2023-11-12 ENCOUNTER — Telehealth: Payer: Self-pay

## 2023-11-12 MED ORDER — LOSARTAN POTASSIUM-HCTZ 50-12.5 MG PO TABS: 1.0000 | ORAL_TABLET | Freq: Every day | ORAL | 3 refills | Status: DC

## 2023-11-12 NOTE — Telephone Encounter (Signed)
Losartan sent.

## 2023-11-12 NOTE — Telephone Encounter (Signed)
 Copied from CRM [id number]. Topic: Clinical - Medication Refill >> Nov 12, 2023 10:32 AM Franky GRADE wrote: Most Recent Primary Care Visit:  Provider: TYUS, BRENDELL L  Department: [facility] [place]  Visit Type: MEDICARE AWV, SEQUENTIAL  Date: 07/05/2023  Medication: ALPRAZolam  (XANAX ) 1 MG tablet & losartan -hydrochlorothiazide  (HYZAAR) 50-12.5 MG tablet  Has the patient contacted their pharmacy? No, patient has an appointment scheduled for 11/15/2023; however, his wife is currently admitted in the hospital and is scheduled for open heart surgery. He would like to know if he can get a refill without being seen.  (Agent: If no, request that the patient contact the pharmacy for the refill. If patient does not wish to contact the pharmacy document the reason why and proceed with request.) (Agent: If yes, when and what did the pharmacy advise?)  Is this the correct pharmacy for this prescription? Yes If no, delete pharmacy and type the correct one.  This is the patient's preferred pharmacy:   [facility] - [place], [place] - [address] [place] [address] Phone: [phone] Fax: [phone]    Has the prescription been filled recently? No  Is the patient out of the medication? No  Has the patient been seen for an appointment in the last year OR does the patient have an upcoming appointment? Yes, Patient is scheduled to come in on 11/14/2022 but may need to R/S due to wife being admitted in the hospital. Would like to speak with Dr.James [name]'s nurse.   Can we respond through MyChart? No, phone call  Agent: Please be advised that Rx refills may take up to 3 business days. We ask that you follow-up with your pharmacy.

## 2023-11-12 NOTE — Telephone Encounter (Signed)
 Xanax too soon   Was rx #[id number] with 2 refills on Oct 21 2023

## 2023-11-14 DIAGNOSIS — M5416 Radiculopathy, lumbar region: Secondary | ICD-10-CM | POA: Diagnosis not present

## 2023-11-15 ENCOUNTER — Ambulatory Visit: Payer: Medicare Other | Admitting: Internal Medicine

## 2023-11-23 ENCOUNTER — Other Ambulatory Visit: Payer: Self-pay | Admitting: Nurse Practitioner

## 2023-12-07 ENCOUNTER — Ambulatory Visit (INDEPENDENT_AMBULATORY_CARE_PROVIDER_SITE_OTHER): Payer: Medicare Other | Admitting: Internal Medicine

## 2023-12-07 ENCOUNTER — Encounter: Payer: Self-pay | Admitting: Internal Medicine

## 2023-12-07 VITALS — BP 134/76 | HR 60 | Temp 97.7°F | Ht 71.0 in | Wt 231.0 lb

## 2023-12-07 DIAGNOSIS — R739 Hyperglycemia, unspecified: Secondary | ICD-10-CM

## 2023-12-07 DIAGNOSIS — I10 Essential (primary) hypertension: Secondary | ICD-10-CM | POA: Diagnosis not present

## 2023-12-07 DIAGNOSIS — R972 Elevated prostate specific antigen [PSA]: Secondary | ICD-10-CM | POA: Diagnosis not present

## 2023-12-07 DIAGNOSIS — J309 Allergic rhinitis, unspecified: Secondary | ICD-10-CM

## 2023-12-07 DIAGNOSIS — Z0001 Encounter for general adult medical examination with abnormal findings: Secondary | ICD-10-CM

## 2023-12-07 DIAGNOSIS — E538 Deficiency of other specified B group vitamins: Secondary | ICD-10-CM | POA: Diagnosis not present

## 2023-12-07 DIAGNOSIS — E559 Vitamin D deficiency, unspecified: Secondary | ICD-10-CM

## 2023-12-07 LAB — CBC WITH DIFFERENTIAL/PLATELET
Basophils Absolute: 0 10*3/uL (ref 0.0–0.1)
Basophils Relative: 0.5 % (ref 0.0–3.0)
Eosinophils Absolute: 0.1 10*3/uL (ref 0.0–0.7)
Eosinophils Relative: 0.7 % (ref 0.0–5.0)
HCT: 45.9 % (ref 39.0–52.0)
Hemoglobin: 15.9 g/dL (ref 13.0–17.0)
Lymphocytes Relative: 30.8 % (ref 12.0–46.0)
Lymphs Abs: 2.2 10*3/uL (ref 0.7–4.0)
MCHC: 34.6 g/dL (ref 30.0–36.0)
MCV: 91.8 fL (ref 78.0–100.0)
Monocytes Absolute: 0.5 10*3/uL (ref 0.1–1.0)
Monocytes Relative: 6.9 % (ref 3.0–12.0)
Neutro Abs: 4.3 10*3/uL (ref 1.4–7.7)
Neutrophils Relative %: 61.1 % (ref 43.0–77.0)
Platelets: 150 10*3/uL (ref 150.0–400.0)
RBC: 5 Mil/uL (ref 4.22–5.81)
RDW: 12.7 % (ref 11.5–15.5)
WBC: 7.1 10*3/uL (ref 4.0–10.5)

## 2023-12-07 LAB — BASIC METABOLIC PANEL
BUN: 12 mg/dL (ref 6–23)
CO2: 31 meq/L (ref 19–32)
Calcium: 9 mg/dL (ref 8.4–10.5)
Chloride: 94 meq/L — ABNORMAL LOW (ref 96–112)
Creatinine, Ser: 0.85 mg/dL (ref 0.40–1.50)
GFR: 87.45 mL/min (ref >60.00)
Glucose, Bld: 90 mg/dL (ref 70–99)
Potassium: 3.9 meq/L (ref 3.5–5.1)
Sodium: 134 meq/L — ABNORMAL LOW (ref 135–145)

## 2023-12-07 LAB — URINALYSIS, ROUTINE W REFLEX MICROSCOPIC
Bilirubin Urine: NEGATIVE
Hgb urine dipstick: NEGATIVE
Ketones, ur: NEGATIVE
Nitrite: NEGATIVE
Specific Gravity, Urine: 1.01 (ref 1.000–1.030)
Total Protein, Urine: NEGATIVE
Urine Glucose: NEGATIVE
Urobilinogen, UA: 0.2 (ref 0.0–1.0)
pH: 7 (ref 5.0–8.0)

## 2023-12-07 LAB — MICROALBUMIN / CREATININE URINE RATIO
Creatinine,U: 46.2 mg/dL
Microalb Creat Ratio: 1.5 mg/g (ref 0.0–30.0)
Microalb, Ur: <0.7 mg/dL (ref 0.0–1.9)

## 2023-12-07 LAB — TSH: TSH: 1.77 u[IU]/mL (ref 0.35–5.50)

## 2023-12-07 LAB — LIPID PANEL
Cholesterol: 161 mg/dL (ref 0–200)
HDL: 59.7 mg/dL (ref >39.00)
LDL Cholesterol: 84 mg/dL (ref 0–99)
NonHDL: 100.97
Total CHOL/HDL Ratio: 3
Triglycerides: 86 mg/dL (ref 0.0–149.0)
VLDL: 17.2 mg/dL (ref 0.0–40.0)

## 2023-12-07 LAB — HEPATIC FUNCTION PANEL
ALT: 9 U/L (ref 0–53)
AST: 12 U/L (ref 0–37)
Albumin: 4.2 g/dL (ref 3.5–5.2)
Alkaline Phosphatase: 79 U/L (ref 39–117)
Bilirubin, Direct: 0.1 mg/dL (ref 0.0–0.3)
Total Bilirubin: 0.6 mg/dL (ref 0.2–1.2)
Total Protein: 6.6 g/dL (ref 6.0–8.3)

## 2023-12-07 LAB — PSA: PSA: 3.35 ng/mL (ref 0.10–4.00)

## 2023-12-07 LAB — VITAMIN D 25 HYDROXY (VIT D DEFICIENCY, FRACTURES): VITD: 22.05 ng/mL — ABNORMAL LOW (ref 30.00–100.00)

## 2023-12-07 LAB — VITAMIN B12: Vitamin B-12: 846 pg/mL (ref 211–911)

## 2023-12-07 LAB — HEMOGLOBIN A1C: Hgb A1c MFr Bld: 5.6 % (ref 4.6–6.5)

## 2023-12-07 MED ORDER — LOSARTAN POTASSIUM-HCTZ 50-12.5 MG PO TABS: 1.0000 | ORAL_TABLET | Freq: Every day | ORAL | 3 refills | Status: DC

## 2023-12-07 MED ORDER — METHYLPREDNISOLONE ACETATE 80 MG/ML IJ SUSP
80.0000 mg | Freq: Once | INTRAMUSCULAR | Status: AC
  Administered 2023-12-07: 80 mg via INTRAMUSCULAR

## 2023-12-07 MED ORDER — POTASSIUM CHLORIDE CRYS ER 20 MEQ PO TBCR: 20.0000 meq | EXTENDED_RELEASE_TABLET | Freq: Two times a day (BID) | ORAL | 3 refills | Status: DC

## 2023-12-07 MED ORDER — PREDNISONE 10 MG PO TABS: ORAL_TABLET | ORAL | 0 refills | Status: DC

## 2023-12-07 NOTE — Assessment & Plan Note (Signed)

## 2023-12-07 NOTE — Progress Notes (Signed)
Patient ID: [name], male   DOB: February 09, 1951, 73 y.o.   MRN: [id number]         Chief Complaint:: wellness exam and allergies, low b12, elevated psa, htn, hyperglycemia, low vit d       HPI:  [name] is a 73 y.o. male here for wellness exam; up to date                        Also to f/u with urology July 2025 with BPH and elevated psa.  To f/u Gi Dr Lavon Paganini soon for reflux.  Pt denies chest pain, increased sob or doe, wheezing, orthopnea, PND, increased LE swelling, palpitations, dizziness or syncope.   Pt denies polydipsia, polyuria, or new focal neuro s/s.    Pt denies fever, wt loss, night sweats, loss of appetite, or other constitutional symptoms  Does have several wks ongoing nasal allergy symptoms with clearish congestion, itch and sneezing, without fever, pain, ST, cough, swelling or wheezing.     Wt Readings from Last 3 Encounters:  12/07/23 231 lb (104.8 kg)  07/05/23 226 lb (102.5 kg)  05/15/23 226 lb (102.5 kg)   BP Readings from Last 3 Encounters:  12/07/23 134/76  05/15/23 136/82  05/07/23 (!) 160/96   Immunization History  Administered Date(s) Administered   Fluad Quad(high Dose 65+) 10/23/2022   Influenza Split 09/12/2021   Influenza Whole 09/23/2007, 08/06/2010, 08/22/2011   Influenza,inj,Quad PF,6+ Mos 09/21/2015, 07/14/2016   Influenza-Unspecified 09/08/2014, 08/10/2017, 08/19/2019, 09/10/2023   Moderna Sars-Covid-2 Vaccination 03/24/2020, 04/20/2020, 10/06/2020   Pneumococcal Conjugate-13 11/12/2020   Pneumococcal Polysaccharide-23 11/07/1999, 09/14/2017, 01/24/2018   RSV,unspecified 10/23/2022   Td 05/03/2005   Tdap 01/18/2017   Zoster Recombinant(Shingrix) 06/24/2021, 09/05/2021  There are no preventive care reminders to display for this patient.    Past Medical History:  Diagnosis Date   Allergic rhinitis    Anemia, pernicious    Anxiety and depression    sees Dr. Evelene Croon   Arthritis    B12 deficiency 01/23/2020    Catheter-associated urinary tract infection (HCC)    Diverticulosis of colon    Elevated PSA    and hypogonadism, sees urologist   GERD (gastroesophageal reflux disease)    hiatal hernia   Hx of blood transfusion reaction 1999   Hyperlipidemia    Hyperparathyroidism (HCC)    Hyperplastic colon polyp    Hypertension    acei causes cough   IBS (irritable bowel syndrome)    Nephrolithiasis    Obstructive chronic bronchitis without exacerbation, followed by Dr. Delford Field 03/04/2009   ONO RA  Normal 07/28/11 6 min walk >549m no desat PFTs 08/08/11: DLCO 70% TLC 80%  FeV1 80%   Fef 25 75 56% with significant improvement after BD    Peritonitis (HCC)    after surgery in 1999   Pneumonia    PONV (postoperative nausea and vomiting)    Rectal fissure    Trigeminal neuralgia    has facial asymetry   Ventral hernia    Past Surgical History:  Procedure Laterality Date   ABDOMINAL SURGERY     Multiple   BRAIN SURGERY  01/2011   Trigeminal nerve/Dr Venetia Maxon   CHOLECYSTECTOMY  1981   DENTAL SURGERY  2019   tooth extraction, root canal   ESOPHAGEAL MANOMETRY N/A 04/28/2020   Procedure: ESOPHAGEAL MANOMETRY (EM);  Surgeon: Napoleon Form, MD;  Location: [facility];  Service: Endoscopy;  Laterality: N/A;   HAND SURGERY  1973   Left   HERNIA REPAIR  1999   KNEE SURGERY  1997   Left   LITHOTRIPSY     Right   NISSEN FUNDOPLICATION  1999    complicated by gastric perforation, peritonitis, ventral nernia    RHIZOTOMY Right 11/17/2014   Procedure: RHIZOTOMY TO RIGHT V2,V3;  Surgeon: Maeola Harman, MD;  Location: [facility];  Service: Neurosurgery;  Laterality: Right;  right   RHIZOTOMY Right 12/22/2014   Procedure: Right V2 and V3 trigeminal rhysolysis;  Surgeon: Maeola Harman, MD;  Location: [facility];  Service: Neurosurgery;  Laterality: Right;  Right V2 and V3 trigeminal rhysolysis   RHIZOTOMY W/ RADIOFREQUENCY ABLATION  08/2017   Dr Ollen Bowl   UPPER GASTROINTESTINAL ENDOSCOPY     VENTRAL  HERNIA REPAIR  2004    reports that he quit smoking about 27 years ago. His smoking use included cigarettes. He started smoking about 47 years ago. He has a 20 pack-year smoking history. He quit smokeless tobacco use about 40 years ago.  His smokeless tobacco use included chew. He reports that he does not drink alcohol and does not use drugs. family history includes Colon polyps in his mother; Coronary artery disease in his mother; Diabetes in his brother, mother, and sister; Heart disease in his brother and brother; Hypertension in his brother. Allergies  Allergen Reactions   Dilantin [Phenytoin] Other (See Comments)    bradycardia   Dilaudid [Hydromorphone Hcl] Nausea And Vomiting   Sulfa Antibiotics Hives   Morphine    Ace Inhibitors Cough   Augmentin [Amoxicillin-Pot Clavulanate] Nausea And Vomiting   Codeine Nausea And Vomiting   Current Outpatient Medications on File Prior to Visit  Medication Sig Dispense Refill   acetaminophen (TYLENOL) 325 MG tablet Take 2 tablets (650 mg total) by mouth every 6 (six) hours as needed for mild pain, moderate pain or fever.     ALPRAZolam (XANAX) 1 MG tablet TAKE 1 TABLET BY MOUTH FOUR TIMES DAILY AS NEEDED FOR ANXIETY 120 tablet 2   carbamazepine (TEGRETOL XR) 100 MG 12 hr tablet Take 100-200 mg by mouth See admin instructions. 100mg  in the morning and 200mg  at night.     Cholecalciferol 50 MCG (2000 UT) TABS 1 tab by mouth once daily 30 tablet 99   cyanocobalamin (VITAMIN B12) 1000 MCG/ML injection INJECT 1 ML every 7 days 30 mL 3   famotidine (PEPCID) 20 MG tablet TAKE 1 TABLET BY MOUTH AT BEDTIME 30 tablet 2   finasteride (PROSCAR) 5 MG tablet Take 1 tablet (5 mg total) by mouth daily. 90 tablet 3   gabapentin (NEURONTIN) 400 MG capsule Take 400-800 mg by mouth See admin instructions. Taking 400mg  at 0800, 1400, then 2 capsules (800mg ) at bedtime.     lansoprazole (PREVACID) 30 MG capsule TAKE ONE CAPSULE BY MOUTH TWICE DAILY 180 capsule 0    polyethylene glycol powder (GLYCOLAX/MIRALAX) powder DISSOLVE 1 CAPFUL IN LIQUID EVERY DAY AS NEEDED FOR CONSTIPATION 527 g 2   Probiotic Product (ALIGN) 4 MG CAPS Take 4 mg by mouth daily.     sucralfate (CARAFATE) 1 g tablet TAKE 1 TABLET BY MOUTH FOUR TIMES DAILY WITH meals AND AT BEDTIME 120 tablet 1   tamsulosin (FLOMAX) 0.4 MG CAPS capsule Take 0.4 mg by mouth at bedtime.     Zinc 220 (50 Zn) MG CAPS Take 220 mg by mouth daily.     No current facility-administered medications on file prior to visit.  ROS:  All others reviewed and negative.  Objective        PE:  BP 134/76 (BP Location: Right Arm, Patient Position: Sitting, Cuff Size: Normal)   Pulse 60   Temp 97.7 F (36.5 C) (Oral)   Ht 5\' 11"  (1.803 m)   Wt 231 lb (104.8 kg)   SpO2 98%   BMI 32.22 kg/m                 Constitutional: Pt appears in NAD               HENT: Head: NCAT.                Right Ear: External ear normal.                 Left Ear: External ear normal.                Eyes: . Pupils are equal, round, and reactive to light. Conjunctivae and EOM are normal               Nose: without d/c or deformity               Neck: Neck supple. Gross normal ROM               Cardiovascular: Normal rate and regular rhythm.                 Pulmonary/Chest: Effort normal and breath sounds without rales or wheezing.                Abd:  Soft, NT, ND, + BS, no organomegaly               Neurological: Pt is alert. At baseline orientation, motor grossly intact               Skin: Skin is warm. No rashes, no other new lesions, LE edema - none               Psychiatric: Pt behavior is normal without agitation   Micro: none  Cardiac tracings I have personally interpreted today:  none  Pertinent Radiological findings (summarize): none   Lab Results  Component Value Date   WBC 7.1 12/07/2023   HGB 15.9 12/07/2023   HCT 45.9 12/07/2023   PLT 150.0 12/07/2023   GLUCOSE 90 12/07/2023   CHOL 161 12/07/2023    TRIG 86.0 12/07/2023   HDL 59.70 12/07/2023   LDLCALC 84 12/07/2023   ALT 9 12/07/2023   AST 12 12/07/2023   NA 134 (L) 12/07/2023   K 3.9 12/07/2023   CL 94 (L) 12/07/2023   CREATININE 0.85 12/07/2023   BUN 12 12/07/2023   CO2 31 12/07/2023   TSH 1.77 12/07/2023   PSA 3.35 12/07/2023   HGBA1C 5.6 12/07/2023   MICROALBUR <0.7 12/07/2023   Assessment/Plan:  Dace Denn is a 73 y.o. White or Caucasian [1] male with  has a past medical history of Allergic rhinitis, Anemia, pernicious, Anxiety and depression, Arthritis, B12 deficiency (01/23/2020), Catheter-associated urinary tract infection (HCC), Diverticulosis of colon, Elevated PSA, GERD (gastroesophageal reflux disease), blood transfusion reaction (1999), Hyperlipidemia, Hyperparathyroidism (HCC), Hyperplastic colon polyp, Hypertension, IBS (irritable bowel syndrome), Nephrolithiasis, Obstructive chronic bronchitis without exacerbation, followed by Dr. Delford Field (03/04/2009), Peritonitis (HCC), Pneumonia, PONV (postoperative nausea and vomiting), Rectal fissure, Trigeminal neuralgia, and Ventral hernia.  Encounter for well adult exam with abnormal findings Age and sex appropriate education and counseling updated with  regular exercise and diet Referrals for preventative services - none needed Immunizations addressed - none needed Smoking counseling  - none needed Evidence for depression or other mood disorder - none significant Most recent labs reviewed. I have personally reviewed and have noted: 1) the patient's medical and social history 2) The patient's current medications and supplements 3) The patient's height, weight, and BMI have been recorded in the chart   Allergic rhinitis Mild to mod, for depomedrol IM 80 mg, prednisone taper, to f/u any worsening symptoms or concerns  B12 deficiency Lab Results  Component Value Date   VITAMINB12 846 12/07/2023   Stable, cont oral replacement - b12 1000 mcg qd   Elevated  PSA Lab Results  Component Value Date   PSA 3.35 12/07/2023   PSA 3.21 05/15/2023   PSA 4.07 (H) 11/13/2022   Stable, cont to follow  Essential hypertension BP Readings from Last 3 Encounters:  12/07/23 134/76  05/15/23 136/82  05/07/23 (!) 160/96   Stable, pt to continue medical treatment hyzaar 50 12.5 qd   Hyperglycemia Lab Results  Component Value Date   HGBA1C 5.6 12/07/2023   Stable, pt to continue current medical treatment  - diet, wt control   Vitamin D deficiency Last vitamin D Lab Results  Component Value Date   VD25OH 22.05 (L) 12/07/2023   Low, to start oral replacement  Followup: Return in about 6 months (around 06/05/2024).  Oliver Barre, MD 12/07/2023 11:18 PM [facility]

## 2023-12-07 NOTE — Assessment & Plan Note (Signed)
Lab Results  Component Value Date   VITAMINB12 846 12/07/2023   Stable, cont oral replacement - b12 1000 mcg qd

## 2023-12-07 NOTE — Assessment & Plan Note (Signed)
BP Readings from Last 3 Encounters:  12/07/23 134/76  05/15/23 136/82  05/07/23 (!) 160/96   Stable, pt to continue medical treatment hyzaar 50 12.5 qd

## 2023-12-07 NOTE — Patient Instructions (Signed)
You had the steroid shot today  Please take all new medication as prescribed - the prednisone  Please continue all other medications as before, and refills have been done if requested.  Please have the pharmacy call with any other refills you may need.  Please continue your efforts at being more active, low cholesterol diet, and weight control.  You are otherwise up to date with prevention measures today.  Please keep your appointments with your specialists as you may have planned  Please go to the LAB at the blood drawing area for the tests to be done  You will be contacted by phone if any changes need to be made immediately.  Otherwise, you will receive a letter about your results with an explanation, but please check with MyChart first.  Please make an Appointment to return in 6 months, or sooner if needed

## 2023-12-07 NOTE — Assessment & Plan Note (Signed)
Mild to mod, for depomedrol IM 80 mg , prednisone taper,   to f/u any worsening symptoms or concerns 

## 2023-12-07 NOTE — Progress Notes (Signed)
 The test results show that your current treatment is OK, as the tests are stable.  Please continue the same plan.  There is no other need for change of treatment or further evaluation based on these results, at this time.  thanks

## 2023-12-07 NOTE — Assessment & Plan Note (Signed)
Lab Results  Component Value Date   PSA 3.35 12/07/2023   PSA 3.21 05/15/2023   PSA 4.07 (H) 11/13/2022   Stable, cont to follow

## 2023-12-07 NOTE — Assessment & Plan Note (Signed)
Last vitamin D Lab Results  Component Value Date   VD25OH 22.05 (L) 12/07/2023   Low, to start oral replacement

## 2023-12-07 NOTE — Assessment & Plan Note (Signed)
Lab Results  Component Value Date   HGBA1C 5.6 12/07/2023   Stable, pt to continue current medical treatment  - diet, wt control

## 2023-12-16 ENCOUNTER — Other Ambulatory Visit: Payer: Self-pay | Admitting: Nurse Practitioner

## 2023-12-17 ENCOUNTER — Other Ambulatory Visit: Payer: Self-pay

## 2023-12-17 ENCOUNTER — Telehealth: Payer: Self-pay | Admitting: Nurse Practitioner

## 2023-12-17 DIAGNOSIS — R142 Eructation: Secondary | ICD-10-CM

## 2023-12-17 DIAGNOSIS — K219 Gastro-esophageal reflux disease without esophagitis: Secondary | ICD-10-CM

## 2023-12-17 MED ORDER — DEXLANSOPRAZOLE 60 MG PO CPDR: 60.0000 mg | DELAYED_RELEASE_CAPSULE | Freq: Every day | ORAL | 1 refills | Status: DC

## 2023-12-17 NOTE — Telephone Encounter (Signed)
 Pt stated that he has been having some burning and pain in his esophagus and mid chest the last 4 or 5 days along with burping a lot.  Pt is requesting a prescription for the Dexilant  stating that is what helps him the most. Pt previously scheduled for an office visit to see Everett Hitt NP on 01/10/2024. Pt aware. Please review and advise .

## 2023-12-17 NOTE — Telephone Encounter (Signed)
 PT is calling with complaints that his esophagus is hurting like a few years ago. He would like the prescription he got called back in for relief. He had no recollection of what the medication was called. Please advise.

## 2023-12-17 NOTE — Telephone Encounter (Signed)
 Landon Pinion, ok to send in Rx for Dexilant  60mg  one tab po every day # 30, 1 additional refills. Patient has seen cardiologist in the past for atypical chest pain. Please have patient contact Dr. Stann Earnest to make sure his chest pain is not cardiac related. Patient to go to the ED if he has worsening chest pain, any dizziness, palpitations or shortness of breath. THX.

## 2023-12-17 NOTE — Telephone Encounter (Signed)
 Pt made aware of Everett Hitt NP recommendations: Prescription was sent to pharmacy. Pt made aware. Pt notified to contact Dr. Stann Earnest to make sure his chest pain is not cardiac related. Patient to go to the ED if he has worsening chest pain, any dizziness, palpitations or shortness of breath.  Pt verbalized understanding with all questions answered.

## 2023-12-19 ENCOUNTER — Telehealth: Payer: Self-pay | Admitting: Cardiovascular Disease

## 2023-12-19 NOTE — Telephone Encounter (Signed)
Pt requesting cb to discuss hiatal hernia since pt is having problems, his GI dr is wanting him to f/u to make sure it is not his heart

## 2023-12-19 NOTE — Telephone Encounter (Signed)
Patient stated that his GI doctor wants him to follow-up with Dr. Eden Emms to make sure he is not having any heart issues that are being thought of as hiatal hernia issues. Made patient an appointment with Dr. Eden Emms to discuss.

## 2023-12-20 NOTE — Progress Notes (Signed)
Cardiology Office Note   Date:  12/28/2023   ID:  Gagan, [name] 1950-11-11, MRN [id number]  PCP:  Corwin Levins, MD  Cardiologist:   Charlton Haws, MD   No chief complaint on file.     History of Present Illness: [name] is a 73 y.o. male who presents for f/U regarding chest pain. Referred by Dr Jonny Ruiz 12/2018   CRF;s include HTN and HLD. He has chronic GERD post Nissen fundoplication and recurrent hiatal hernia Taking Dexilant and Carafate He gets frequent burning pain in epigastric area and chest as well as burping and nausea. Definitely GI Overtones to pain Was hospitalized with another SBO 2/18-2/21/22 with decompression NG tube spontaneous resolution   Retired from [employer] has 3 kids. Brother had stents at his age No chest pain   Calcium score done 01/02/19 was 71 below average for age 4 th percentile with LDL running in the 80 range with diet Rx Repeat calcium score done 12/27/21 was 155 which was 53 rd percentile for age/sex primarily noted in LAD and a bit in LCX   He sees GI for B12 deficiency, GERD Prior Nissen Fundoplication with perforation needing revision in 1999 GB removal and ventral hernia with SBO from adhesions He has pill dysphagia Declined barium swallow  Last colon and EGD 08/24/21   GI doctor Riley Kill,  wants to make sure GERD symptoms not related to heart   Pain is burning radiating to back improved with dexilant Not always exertional. He is worried as an older brother had aortic repair and stents and younger brother had CABG  Past Medical History:  Diagnosis Date   Allergic rhinitis    Anemia, pernicious    Anxiety and depression    sees Dr. Evelene Croon   Arthritis    B12 deficiency 01/23/2020   Catheter-associated urinary tract infection (HCC)    Diverticulosis of colon    Elevated PSA    and hypogonadism, sees urologist   GERD (gastroesophageal reflux disease)    hiatal hernia   Hx of blood transfusion  reaction 1999   Hyperlipidemia    Hyperparathyroidism (HCC)    Hyperplastic colon polyp    Hypertension    acei causes cough   IBS (irritable bowel syndrome)    Nephrolithiasis    Obstructive chronic bronchitis without exacerbation, followed by Dr. Delford Field 03/04/2009   ONO RA  Normal 07/28/11 6 min walk >544m no desat PFTs 08/08/11: DLCO 70% TLC 80%  FeV1 80%   Fef 25 75 56% with significant improvement after BD    Peritonitis (HCC)    after surgery in 1999   Pneumonia    PONV (postoperative nausea and vomiting)    Rectal fissure    Trigeminal neuralgia    has facial asymetry   Ventral hernia     Past Surgical History:  Procedure Laterality Date   ABDOMINAL SURGERY     Multiple   BRAIN SURGERY  01/2011   Trigeminal nerve/Dr Venetia Maxon   CHOLECYSTECTOMY  1981   DENTAL SURGERY  2019   tooth extraction, root canal   ESOPHAGEAL MANOMETRY N/A 04/28/2020   Procedure: ESOPHAGEAL MANOMETRY (EM);  Surgeon: Napoleon Form, MD;  Location: [facility];  Service: Endoscopy;  Laterality: N/A;   HAND SURGERY  1973   Left   HERNIA REPAIR  1999   KNEE SURGERY  1997   Left   LITHOTRIPSY     Right   NISSEN FUNDOPLICATION  1999  complicated by gastric perforation, peritonitis, ventral nernia    RHIZOTOMY Right 11/17/2014   Procedure: RHIZOTOMY TO RIGHT V2,V3;  Surgeon: Maeola Harman, MD;  Location: [facility];  Service: Neurosurgery;  Laterality: Right;  right   RHIZOTOMY Right 12/22/2014   Procedure: Right V2 and V3 trigeminal rhysolysis;  Surgeon: Maeola Harman, MD;  Location: [facility];  Service: Neurosurgery;  Laterality: Right;  Right V2 and V3 trigeminal rhysolysis   RHIZOTOMY W/ RADIOFREQUENCY ABLATION  08/2017   Dr Ollen Bowl   UPPER GASTROINTESTINAL ENDOSCOPY     VENTRAL HERNIA REPAIR  2004     Current Outpatient Medications  Medication Sig Dispense Refill   acetaminophen (TYLENOL) 325 MG tablet Take 2 tablets (650 mg total) by mouth every 6 (six) hours as needed for mild pain,  moderate pain or fever.     ALPRAZolam (XANAX) 1 MG tablet TAKE 1 TABLET BY MOUTH FOUR TIMES DAILY AS NEEDED FOR ANXIETY 120 tablet 2   carbamazepine (TEGRETOL XR) 100 MG 12 hr tablet Take 100-200 mg by mouth See admin instructions. 100mg  in the morning and 200mg  at night.     cyanocobalamin (VITAMIN B12) 1000 MCG/ML injection INJECT 1 ML every 7 days 30 mL 3   dexlansoprazole (DEXILANT) 60 MG capsule Take 1 capsule (60 mg total) by mouth daily. 30 capsule 1   famotidine (PEPCID) 20 MG tablet TAKE 1 TABLET BY MOUTH AT BEDTIME 30 tablet 2   finasteride (PROSCAR) 5 MG tablet Take 1 tablet (5 mg total) by mouth daily. 90 tablet 3   gabapentin (NEURONTIN) 400 MG capsule Take 400-800 mg by mouth See admin instructions. Taking 400mg  at 0800, 1400, then 2 capsules (800mg ) at bedtime.     losartan-hydrochlorothiazide (HYZAAR) 50-12.5 MG tablet Take 1 tablet by mouth daily. 90 tablet 3   polyethylene glycol powder (GLYCOLAX/MIRALAX) powder DISSOLVE 1 CAPFUL IN LIQUID EVERY DAY AS NEEDED FOR CONSTIPATION 527 g 2   predniSONE (DELTASONE) 10 MG tablet 3 tabs by mouth per day for 3 days,2tabs per day for 3 days,1tab per day for 3 days 18 tablet 0   Probiotic Product (ALIGN) 4 MG CAPS Take 4 mg by mouth daily.     sucralfate (CARAFATE) 1 g tablet TAKE 1 TABLET BY MOUTH FOUR TIMES DAILY WITH meals AND AT BEDTIME 120 tablet 1   tamsulosin (FLOMAX) 0.4 MG CAPS capsule Take 0.4 mg by mouth at bedtime.     No current facility-administered medications for this visit.    Allergies:   Dilantin [phenytoin], Dilaudid [hydromorphone hcl], Sulfa antibiotics, Morphine, Ace inhibitors, Augmentin [amoxicillin-pot clavulanate], and Codeine    Social History:  The patient  reports that he quit smoking about 27 years ago. His smoking use included cigarettes. He started smoking about 47 years ago. He has a 20 pack-year smoking history. He quit smokeless tobacco use about 40 years ago.  His smokeless tobacco use included chew.  He reports that he does not drink alcohol and does not use drugs.   Family History:  The patient's family history includes Colon polyps in his mother; Coronary artery disease in his mother; Diabetes in his brother, mother, and sister; Heart disease in his brother and brother; Hypertension in his brother.    ROS:  Please see the history of present illness.   Otherwise, review of systems are positive for none.   All other systems are reviewed and negative.    PHYSICAL EXAM: VS:  BP (!) 142/84   Pulse 63   Ht  5\' 11"  (1.803 m)   Wt 229 lb 12.8 oz (104.2 kg)   SpO2 98%   BMI 32.05 kg/m  , BMI Body mass index is 32.05 kg/m. Affect appropriate Healthy:  appears stated age HEENT: normal Neck supple with no adenopathy JVP normal no bruits no thyromegaly Lungs clear with no wheezing and good diaphragmatic motion Heart:  S1/S2 no murmur, no rub, gallop or click PMI normal Abdomen: benighn, multiple previous surgeries  no bruit.  No HSM or HJR Distal pulses intact with no bruits No edema Neuro non-focal Skin warm and dry No muscular weakness    EKG:  12/28/2023 NSR normal ECG rate 67    Recent Labs: 12/07/2023: ALT 9; BUN 12; Creatinine, Ser 0.85; Hemoglobin 15.9; Platelets 150.0; Potassium 3.9; Sodium 134; TSH 1.77    Lipid Panel    Component Value Date/Time   CHOL 161 12/07/2023 1106   TRIG 86.0 12/07/2023 1106   HDL 59.70 12/07/2023 1106   CHOLHDL 3 12/07/2023 1106   VLDL 17.2 12/07/2023 1106   LDLCALC 84 12/07/2023 1106      Wt Readings from Last 3 Encounters:  12/28/23 229 lb 12.8 oz (104.2 kg)  12/07/23 231 lb (104.8 kg)  07/05/23 226 lb (102.5 kg)      Other studies Reviewed: Additional studies/ records that were reviewed today include: Notes from primary Notes from GI , ECG.    ASSESSMENT AND PLAN:  1.  Chest Pain:  Atypical GI doctor concerned that GERD symptoms may be related to heart. Most recent calcium score 2.21.23 155 , 53 rd percentile mostly in  LAD. ECG normal Shared decision making favor full cardiac CTA to further risk stratify for obstructive CAD  2.  GI: Hospitalized for SBO 2/18-2/22/ 22 slipped Nissen fundoplication and multiple previous attempts at ventral hernia repair F/U Dr Daphine Deutscher and GI Defers barium swallow Due for repeat colon for polyps October 2025   Continue Carafate, prevacid and pepcid  3.  HTN:  Well controlled.  Continue current medications and low sodium Dash type diet.    4.  HLD:  LDL 84 continue diet Rx  consider statin pending cardiac CTA  5. COVID: mild resolved  Now vaccinated   6. Leg Pain: good pulses on exam no edema to suggest DVT Duplex 02/05/20 with no DVT f/u primary   7/ BPH:  on flomax and proscar  PSA 12 3 years ago most recent down to 4.07 f/u urology  BMET Lopressor 50 mg prior to test Cardiac CTA   Disposition:   FU with cardiology  In a year     Signed, Charlton Haws, MD  12/28/2023 11:17 AM    [facility] [address] [place], [place], [place]  [address] Phone: [phone]; Fax: [phone]

## 2023-12-28 ENCOUNTER — Ambulatory Visit: Payer: Medicare Other | Attending: Cardiovascular Disease | Admitting: Cardiovascular Disease

## 2023-12-28 ENCOUNTER — Encounter: Payer: Self-pay | Admitting: Cardiovascular Disease

## 2023-12-28 VITALS — BP 142/84 | HR 63 | Ht 71.0 in | Wt 229.8 lb

## 2023-12-28 DIAGNOSIS — I2510 Atherosclerotic heart disease of native coronary artery without angina pectoris: Secondary | ICD-10-CM

## 2023-12-28 DIAGNOSIS — I10 Essential (primary) hypertension: Secondary | ICD-10-CM

## 2023-12-28 DIAGNOSIS — R079 Chest pain, unspecified: Secondary | ICD-10-CM

## 2023-12-28 MED ORDER — METOPROLOL TARTRATE 50 MG PO TABS: 50.0000 mg | ORAL_TABLET | Freq: Once | ORAL | 0 refills | Status: DC

## 2023-12-28 NOTE — Patient Instructions (Addendum)
Medication Instructions:  Your physician recommends that you continue on your current medications as directed. Please refer to the Current Medication list given to you today.  *If you need a refill on your cardiac medications before your next appointment, please call your pharmacy*  Lab Work: If you have labs (blood work) drawn today and your tests are completely normal, you will receive your results only by: MyChart Message (if you have MyChart) OR A paper copy in the mail If you have any lab test that is abnormal or we need to change your treatment, we will call you to review the results.  Testing/Procedures: Your physician has requested that you have cardiac CT. Cardiac computed tomography (CT) is a painless test that uses an x-ray machine to take clear, detailed pictures of your heart. For further information please visit [email or web address] Please follow instruction sheet as given.  Follow-Up: At [facility], you and your health needs are our priority.  As part of our continuing mission to provide you with exceptional heart care, we have created designated Provider Care Teams.  These Care Teams include your primary Cardiologist (physician) and Advanced Practice Providers (APPs -  Physician Assistants and Nurse Practitioners) who all work together to provide you with the care you need, when you need it.  We recommend signing up for the patient portal called "MyChart".  Sign up information is provided on this After Visit Summary.  MyChart is used to connect with patients for Virtual Visits (Telemedicine).  Patients are able to view lab/test results, encounter notes, upcoming appointments, etc.  Non-urgent messages can be sent to your provider as well.   To learn more about what you can do with MyChart, go to [email or web address].    Your next appointment:   1 year(s)  Provider:   Charlton Haws, MD     Other Instructions   Your cardiac CT will be scheduled at one of the  below locations:   [facility] [address] [place], [place] [address] [phone]   If scheduled at [facility], please arrive at the [facility]tr[facility] (Entrance C2) of [facility] 30 minutes prior to test start time. You can use the FREE valet parking offered at entrance C (encouraged to control the heart rate for the test)  Proceed to the [facility] Radiology Department (first floor) to check-in and test prep.  All radiology patients and guests should use entrance C2 at [facility], accessed from [facility], even though the hospital's physical address listed is [address]     Please follow these instructions carefully (unless otherwise directed):  An IV will be required for this test and Nitroglycerin will be given.  Hold all erectile dysfunction medications at least 3 days (72 hrs) prior to test. (Ie viagra, cialis, sildenafil, tadalafil, etc)   On the Night Before the Test: Be sure to Drink plenty of water. Do not consume any caffeinated/decaffeinated beverages or chocolate 12 hours prior to your test. Do not take any antihistamines 12 hours prior to your test.  On the Day of the Test: Drink plenty of water until 1 hour prior to the test. Do not eat any food 1 hour prior to test. You may take your regular medications prior to the test.  Take metoprolol (Lopressor) two hours prior to test. If you take Furosemide/Hydrochlorothiazide/Spironolactone, please HOLD on the morning of the test.      After the Test: Drink plenty of water. After  receiving IV contrast, you may experience a mild flushed feeling. This is normal. On occasion, you may experience a mild rash up to 24 hours after the test. This is not dangerous. If this occurs, you can take Benadryl 25 mg and increase your fluid intake. If you experience trouble breathing, this can be serious. If it is severe call 911 IMMEDIATELY. If it is mild,  please call our office.  We will call to schedule your test 2-4 weeks out understanding that some insurance companies will need an authorization prior to the service being performed.   For more information and frequently asked questions, please visit our website : [email or web address]  For non-scheduling related questions, please contact the cardiac imaging nurse navigator should you have any questions/concerns: Cardiac Imaging Nurse Navigators Direct Office Dial: [phone]   For scheduling needs, including cancellations and rescheduling, please call [place], [phone].       1st Floor: - Lobby - Registration  - Pharmacy  - Lab - Cafe  2nd Floor: - PV Lab - Diagnostic Testing (echo, CT, nuclear med)  3rd Floor: - Vacant  4th Floor: - TCTS (cardiothoracic surgery) - AFib Clinic - Structural Heart Clinic - Vascular Surgery  - Vascular Ultrasound  5th Floor: - HeartCare Cardiology (general and EP) - Clinical Pharmacy for coumadin, hypertension, lipid, weight-loss medications, and med management appointments    Valet parking services will be available as well.

## 2023-12-29 LAB — BASIC METABOLIC PANEL
BUN/Creatinine Ratio: 12 (ref 10–24)
BUN: 10 mg/dL (ref 8–27)
CO2: 21 mmol/L (ref 20–29)
Calcium: 9.1 mg/dL (ref 8.6–10.2)
Chloride: 94 mmol/L — ABNORMAL LOW (ref 96–106)
Creatinine, Ser: 0.81 mg/dL (ref 0.76–1.27)
Glucose: 136 mg/dL — ABNORMAL HIGH (ref 70–99)
Potassium: 4 mmol/L (ref 3.5–5.2)
Sodium: 132 mmol/L — ABNORMAL LOW (ref 134–144)
eGFR: 94 mL/min/{1.73_m2} (ref >59)

## 2024-01-10 ENCOUNTER — Ambulatory Visit: Payer: Medicare Other | Admitting: Nurse Practitioner

## 2024-01-10 ENCOUNTER — Encounter: Payer: Self-pay | Admitting: Nurse Practitioner

## 2024-01-10 ENCOUNTER — Encounter (HOSPITAL_COMMUNITY): Payer: Self-pay

## 2024-01-10 VITALS — BP 126/76 | HR 67 | Ht 71.0 in | Wt 230.0 lb

## 2024-01-10 DIAGNOSIS — Z860101 Personal history of adenomatous and serrated colon polyps: Secondary | ICD-10-CM | POA: Diagnosis not present

## 2024-01-10 DIAGNOSIS — K219 Gastro-esophageal reflux disease without esophagitis: Secondary | ICD-10-CM | POA: Diagnosis not present

## 2024-01-10 DIAGNOSIS — Z860100 Personal history of colon polyps, unspecified: Secondary | ICD-10-CM

## 2024-01-10 MED ORDER — ESOMEPRAZOLE MAGNESIUM 40 MG PO CPDR: 40.0000 mg | DELAYED_RELEASE_CAPSULE | Freq: Two times a day (BID) | ORAL | 3 refills | Status: DC

## 2024-01-10 NOTE — Patient Instructions (Signed)
 Stop Dexilant.   Stop Prevacid.  We have sent the following medications to your pharmacy for you to pick up at your convenience: Esomeprazole 40mg  take 30 minutes before breakfast and dinner.  Thank you for trusting me with your gastrointestinal care!   Alcide Evener, CRNP    _______________________________________________________  If your blood pressure at your visit was 140/90 or greater, please contact your primary care physician to follow up on this.  _______________________________________________________  If you are age 49 or older, your body mass index should be between 23-30. Your Body mass index is 32.08 kg/m. If this is out of the aforementioned range listed, please consider follow up with your Primary Care Provider.  If you are age 30 or younger, your body mass index should be between 19-25. Your Body mass index is 32.08 kg/m. If this is out of the aformentioned range listed, please consider follow up with your Primary Care Provider.   ________________________________________________________  The [place] [facility] providers would like to encourage you to use [facility] to communicate with providers for non-urgent requests or questions.  Due to long hold times on the telephone, sending your provider a message by [facility] may be a faster and more efficient way to get a response.  Please allow 48 business hours for a response.  Please remember that this is for non-urgent requests.  _______________________________________________________

## 2024-01-10 NOTE — Progress Notes (Signed)
 01/10/2024 [name] [id number] 17-Jan-1951   Chief Complaint: Med refill   History of Present Illness: [name] is a 73 year old male with a past medical history of anxiety, depression, arthritis, vitamin B12 deficiency, tubulovillous adenomatous colon polyp and GERD, hiatal hernia s/p Nissen fundoplication 1999 complicated by a perforation s/p redo surgery by Dr. Daphine Deutscher 1999. Past Ventral hernia repair with recurrent SBO secondary to adhesions. Past cholecystectomy.  He is known by Dr. Lavon Paganini.  He presents today for GERD follow-up and to discuss his PPI regimen.  He was previously on Dexilant but this medication was discontinued as the cost was prohibitive.  He was switched to Prevacid 30 mg twice daily and Pepcid nightly.  He has intermittent flares of acid reflux with esophageal/chest burning discomfort which radiates into his back.  He recently contacted our office with concerns of worsening esophageal/chest burning discomfort and he was advised to contact his cardiologist Dr. Eden Emms to make sure his chest pain was not cardiac related.  He was seen by Dr. Eden Emms 12/28/2023 and a cardiac CTA was scheduled 01/14/2024.  He denies having any chest pain at this time.  He stated Nexium worked better than Prevacid when he took it in the past.  He requests a prescription for Nexium.  No nausea or vomiting.  He is passing a normal brown formed stool daily.  No bloody or black stools.  He takes MiraLAX nightly. His most recent EGD and colonoscopy were 08/24/2021. The EGD identified reflux esophagitis, medium hiatal hernia and gastritis. No evidence of H. pylori. The colonoscopy identified 2 tubulovillous adenomatous polyps removed from the transverse colon and inflammatory polyp removed from the rectum. He was advised to repeat a colonoscopy in 3 years.      Latest Ref Rng & Units 12/07/2023   11:06 AM 05/15/2023   11:20 AM 11/13/2022   11:51 AM  CBC  WBC 4.0 - 10.5 K/uL 7.1   8.2  8.0   Hemoglobin 13.0 - 17.0 g/dL 76.2  83.1  51.7   Hematocrit 39.0 - 52.0 % 45.9  45.1  46.9   Platelets 150.0 - 400.0 K/uL 150.0  157.0  159.0        Latest Ref Rng & Units 12/28/2023   12:37 PM 12/07/2023   11:06 AM 05/15/2023   11:20 AM  CMP  Glucose 70 - 99 mg/dL 616  90  95   BUN 8 - 27 mg/dL 10  12  8    Creatinine 0.76 - 1.27 mg/dL 0.73  7.10  6.26   Sodium 134 - 144 mmol/L 132  134  133   Potassium 3.5 - 5.2 mmol/L 4.0  3.9  4.3   Chloride 96 - 106 mmol/L 94  94  95   CO2 20 - 29 mmol/L 21  31  31    Calcium 8.6 - 10.2 mg/dL 9.1  9.0  9.3   Total Protein 6.0 - 8.3 g/dL  6.6  6.9   Total Bilirubin 0.2 - 1.2 mg/dL  0.6  0.6   Alkaline Phos 39 - 117 U/L  79  77   AST 0 - 37 U/L  12  13   ALT 0 - 53 U/L  9  10     Coronary CT 12/15/2021: 1. Coronary calcium score of 155. This was 53rd percentile for age-, race-, and sex-matched controls. 2. Aorta: Mildly dilated aorta to 40 mm at the level of the main PA bifurcation. Aortic atherosclerosis is  noted.   EGD 08/24/2021: LA grade B reflux esophagitis with no bleeding Medium size hiatal hernia Gastritis.  Biopsied. Normal examined duodenum   Colonoscopy 08/2021:   - Two 4 to 10 mm polyps in the transverse colon, removed with a cold snare. Resected and retrieved. - One 8 mm polyp in the rectum, removed with a hot snare. Resected and retrieved. - Moderate diverticulosis in the sigmoid colon, in the descending colon, in the transverse colon, in the ascending colon and in the cecum. - Non-bleeding external and internal hemorrhoids. - 3 year recall colonoscopy    1. Surgical [P], gastric - gastritis bx - GASTRIC ANTRAL AND OXYNTIC MUCOSA WITH MILD CHRONIC GASTRITIS - WARTHIN STARRY STAIN IS NEGATIVE FOR HELICOBACTER PYLORI 2. Surgical [P], colon, transverse, polyp (2) - TUBULOVILLOUS ADENOMA(S) - NEGATIVE FOR HIGH-GRADE DYSPLASIA OR MALIGNANCY 3. Surgical [P], colon, rectum, polyp (1) - INFLAMMATORY POLYP - NEGATIVE FOR  DYSPLASIA     Past Medical History:  Diagnosis Date   Allergic rhinitis    Anemia, pernicious    Anxiety and depression    sees Dr. Evelene Croon   Arthritis    B12 deficiency 01/23/2020   Catheter-associated urinary tract infection (HCC)    Diverticulosis of colon    Elevated PSA    and hypogonadism, sees urologist   GERD (gastroesophageal reflux disease)    hiatal hernia   Hx of blood transfusion reaction 1999   Hyperlipidemia    Hyperparathyroidism (HCC)    Hyperplastic colon polyp    Hypertension    acei causes cough   IBS (irritable bowel syndrome)    Nephrolithiasis    Obstructive chronic bronchitis without exacerbation, followed by Dr. Delford Field 03/04/2009   ONO RA  Normal 07/28/11 6 min walk >544m no desat PFTs 08/08/11: DLCO 70% TLC 80%  FeV1 80%   Fef 25 75 56% with significant improvement after BD    Peritonitis (HCC)    after surgery in 1999   Pneumonia    PONV (postoperative nausea and vomiting)    Rectal fissure    Trigeminal neuralgia    has facial asymetry   Ventral hernia    Past Surgical History:  Procedure Laterality Date   ABDOMINAL SURGERY     Multiple   BRAIN SURGERY  01/2011   Trigeminal nerve/Dr Venetia Maxon   CHOLECYSTECTOMY  1981   DENTAL SURGERY  2019   tooth extraction, root canal   ESOPHAGEAL MANOMETRY N/A 04/28/2020   Procedure: ESOPHAGEAL MANOMETRY (EM);  Surgeon: Napoleon Form, MD;  Location: [facility];  Service: Endoscopy;  Laterality: N/A;   HAND SURGERY  1973   Left   HERNIA REPAIR  1999   KNEE SURGERY  1997   Left   LITHOTRIPSY     Right   NISSEN FUNDOPLICATION  1999    complicated by gastric perforation, peritonitis, ventral nernia    RHIZOTOMY Right 11/17/2014   Procedure: RHIZOTOMY TO RIGHT V2,V3;  Surgeon: Maeola Harman, MD;  Location: [facility];  Service: Neurosurgery;  Laterality: Right;  right   RHIZOTOMY Right 12/22/2014   Procedure: Right V2 and V3 trigeminal rhysolysis;  Surgeon: Maeola Harman, MD;  Location: [facility];   Service: Neurosurgery;  Laterality: Right;  Right V2 and V3 trigeminal rhysolysis   RHIZOTOMY W/ RADIOFREQUENCY ABLATION  08/2017   Dr Ollen Bowl   UPPER GASTROINTESTINAL ENDOSCOPY     VENTRAL HERNIA REPAIR  2004   Current Outpatient Medications on File Prior to Visit  Medication Sig Dispense Refill  acetaminophen (TYLENOL) 325 MG tablet Take 2 tablets (650 mg total) by mouth every 6 (six) hours as needed for mild pain, moderate pain or fever.     ALPRAZolam (XANAX) 1 MG tablet TAKE 1 TABLET BY MOUTH FOUR TIMES DAILY AS NEEDED FOR ANXIETY 120 tablet 2   carbamazepine (TEGRETOL XR) 100 MG 12 hr tablet Take 100-200 mg by mouth See admin instructions. 100mg  in the morning and 200mg  at night.     cyanocobalamin (VITAMIN B12) 1000 MCG/ML injection INJECT 1 ML every 7 days 30 mL 3   dexlansoprazole (DEXILANT) 60 MG capsule Take 1 capsule (60 mg total) by mouth daily. 30 capsule 1   famotidine (PEPCID) 20 MG tablet TAKE 1 TABLET BY MOUTH AT BEDTIME 30 tablet 2   finasteride (PROSCAR) 5 MG tablet Take 1 tablet (5 mg total) by mouth daily. 90 tablet 3   gabapentin (NEURONTIN) 400 MG capsule Take 400-800 mg by mouth See admin instructions. Taking 400mg  at 0800, 1400, then 2 capsules (800mg ) at bedtime.     losartan-hydrochlorothiazide (HYZAAR) 50-12.5 MG tablet Take 1 tablet by mouth daily. 90 tablet 3   polyethylene glycol powder (GLYCOLAX/MIRALAX) powder DISSOLVE 1 CAPFUL IN LIQUID EVERY DAY AS NEEDED FOR CONSTIPATION 527 g 2   Probiotic Product (ALIGN) 4 MG CAPS Take 4 mg by mouth daily.     sucralfate (CARAFATE) 1 g tablet TAKE 1 TABLET BY MOUTH FOUR TIMES DAILY WITH meals AND AT BEDTIME 120 tablet 1   tamsulosin (FLOMAX) 0.4 MG CAPS capsule Take 0.4 mg by mouth at bedtime.     metoprolol tartrate (LOPRESSOR) 50 MG tablet Take 1 tablet (50 mg total) by mouth once for 1 dose. Take 90-120 minutes prior to scan. Hold for systolic blood pressure (top number) less than 110. 1 tablet 0   predniSONE  (DELTASONE) 10 MG tablet 3 tabs by mouth per day for 3 days,2tabs per day for 3 days,1tab per day for 3 days (Patient not taking: Reported on 01/10/2024) 18 tablet 0   No current facility-administered medications on file prior to visit.   Allergies  Allergen Reactions   Dilantin [Phenytoin] Other (See Comments)    bradycardia   Dilaudid [Hydromorphone Hcl] Nausea And Vomiting   Sulfa Antibiotics Hives   Morphine    Ace Inhibitors Cough   Augmentin [Amoxicillin-Pot Clavulanate] Nausea And Vomiting   Codeine Nausea And Vomiting   Current Medications, Allergies, Past Medical History, Past Surgical History, Family History and Social History were reviewed in [employer] record.  Review of Systems:   Constitutional: Negative for fever, sweats, chills or weight loss.  Respiratory: Negative for shortness of breath.   Cardiovascular: Negative for chest pain, palpitations and leg swelling.  Gastrointestinal: See HPI.  Musculoskeletal: Negative for back pain or muscle aches.  Neurological: Negative for dizziness, headaches or paresthesias.   Physical Exam: BP 126/76   Pulse 67   Ht 5\' 11"  (1.803 m)   Wt 230 lb (104.3 kg)   BMI 32.08 kg/m   General: 73 year old male in no acute distress. Head: Normocephalic and atraumatic. Eyes: No scleral icterus. Conjunctiva pink . Ears: Normal auditory acuity. Mouth: Dentition intact. No ulcers or lesions.  Lungs: Clear throughout to auscultation. Heart: Regular rate and rhythm, no murmur. Abdomen: Soft, nontender and nondistended. No masses or hepatomegaly. Normal bowel sounds x 4 quadrants.  Rectal: Deferred.  Musculoskeletal: Symmetrical with no gross deformities. Extremities: No edema. Neurological: Alert oriented x 4. No focal deficits.  Psychological: Alert  and cooperative. Normal mood and affect  Assessment and Recommendations:  73 year old male with a history of GERD and a hiatal hernia s/p Nissen fundoplication  1999 complicated by a perforation s/p redo surgery 1999.  He presents with intermittent flares of acid reflux which she describes as burning esophageal/chest pain which radiates into his back.  No chest pain at this time. -Proceed with cardiac workup, coronary CTA scheduled 01/14/2024 -Stop Dexilant. Stop Prevacid. -Start as Omeprazole 40 mg 1 p.o. twice daily to be taken 30 minutes before breakfast and dinner -GERD diet  History of colon polyps.  Colonoscopy 08/24/2021 identified 2 tubulovillous adenomatous polyps removed from the transverse colon and inflammatory polyp removed from the rectum -Next colonoscopy due 08/2024

## 2024-01-11 ENCOUNTER — Telehealth (HOSPITAL_COMMUNITY): Payer: Self-pay | Admitting: *Deleted

## 2024-01-11 NOTE — Telephone Encounter (Signed)
 Reaching out to patient to offer assistance regarding upcoming cardiac imaging study; pt verbalizes understanding of appt date/time, parking situation and where to check in, pre-test NPO status and medications ordered, and verified current allergies; name and call back number provided for further questions should they arise Johney Frame RN Navigator Cardiac Imaging [facility] [phone] office [phone] cell

## 2024-01-14 ENCOUNTER — Ambulatory Visit (HOSPITAL_COMMUNITY)
Admission: RE | Admit: 2024-01-14 | Discharge: 2024-01-14 | Disposition: A | Discharge status: Home / Self Care | Payer: Medicare Other | Source: Ambulatory Visit | Attending: Cardiovascular Disease | Admitting: Cardiovascular Disease

## 2024-01-14 DIAGNOSIS — I2510 Atherosclerotic heart disease of native coronary artery without angina pectoris: Secondary | ICD-10-CM | POA: Insufficient documentation

## 2024-01-14 DIAGNOSIS — R079 Chest pain, unspecified: Secondary | ICD-10-CM | POA: Insufficient documentation

## 2024-01-14 DIAGNOSIS — I700 Atherosclerosis of aorta: Secondary | ICD-10-CM | POA: Diagnosis not present

## 2024-01-14 MED ORDER — NITROGLYCERIN 0.4 MG SL SUBL
SUBLINGUAL_TABLET | SUBLINGUAL | Status: AC
  Filled 2024-01-14: qty 2

## 2024-01-14 MED ORDER — NITROGLYCERIN 0.4 MG SL SUBL
0.8000 mg | SUBLINGUAL_TABLET | Freq: Once | SUBLINGUAL | Status: AC
  Administered 2024-01-14: 0.8 mg via SUBLINGUAL

## 2024-01-14 MED ORDER — IOHEXOL 350 MG/ML SOLN
95.0000 mL | Freq: Once | INTRAVENOUS | Status: AC | PRN
  Administered 2024-01-14: 95 mL via INTRAVENOUS

## 2024-01-14 MED ORDER — DILTIAZEM HCL 25 MG/5ML IV SOLN: 10.0000 mg | INTRAVENOUS | Status: DC | PRN

## 2024-01-14 MED ORDER — METOPROLOL TARTRATE 5 MG/5ML IV SOLN: 10.0000 mg | Freq: Once | INTRAVENOUS | Status: DC | PRN

## 2024-01-15 ENCOUNTER — Other Ambulatory Visit: Payer: Self-pay | Admitting: Internal Medicine

## 2024-01-28 DIAGNOSIS — H2513 Age-related nuclear cataract, bilateral: Secondary | ICD-10-CM | POA: Diagnosis not present

## 2024-01-28 DIAGNOSIS — H524 Presbyopia: Secondary | ICD-10-CM | POA: Diagnosis not present

## 2024-01-29 ENCOUNTER — Other Ambulatory Visit: Payer: Self-pay | Admitting: Nurse Practitioner

## 2024-01-29 DIAGNOSIS — K219 Gastro-esophageal reflux disease without esophagitis: Secondary | ICD-10-CM

## 2024-01-29 DIAGNOSIS — R142 Eructation: Secondary | ICD-10-CM

## 2024-02-14 ENCOUNTER — Other Ambulatory Visit: Payer: Self-pay | Admitting: Nurse Practitioner

## 2024-02-22 ENCOUNTER — Other Ambulatory Visit: Payer: Self-pay | Admitting: Nurse Practitioner

## 2024-02-22 DIAGNOSIS — K219 Gastro-esophageal reflux disease without esophagitis: Secondary | ICD-10-CM

## 2024-02-22 DIAGNOSIS — R142 Eructation: Secondary | ICD-10-CM

## 2024-02-25 NOTE — Telephone Encounter (Signed)
Previously discontinued due to cost.

## 2024-03-13 ENCOUNTER — Other Ambulatory Visit: Payer: Self-pay | Admitting: Nurse Practitioner

## 2024-03-24 ENCOUNTER — Other Ambulatory Visit: Payer: Self-pay | Admitting: Nurse Practitioner

## 2024-03-24 DIAGNOSIS — K219 Gastro-esophageal reflux disease without esophagitis: Secondary | ICD-10-CM

## 2024-03-24 DIAGNOSIS — R142 Eructation: Secondary | ICD-10-CM

## 2024-04-14 ENCOUNTER — Other Ambulatory Visit: Payer: Self-pay | Admitting: Nurse Practitioner

## 2024-04-14 ENCOUNTER — Other Ambulatory Visit: Payer: Self-pay | Admitting: Internal Medicine

## 2024-04-23 DIAGNOSIS — R0981 Nasal congestion: Secondary | ICD-10-CM | POA: Diagnosis not present

## 2024-04-23 DIAGNOSIS — R030 Elevated blood-pressure reading, without diagnosis of hypertension: Secondary | ICD-10-CM | POA: Diagnosis not present

## 2024-05-19 DIAGNOSIS — M5416 Radiculopathy, lumbar region: Secondary | ICD-10-CM | POA: Diagnosis not present

## 2024-05-19 DIAGNOSIS — G500 Trigeminal neuralgia: Secondary | ICD-10-CM | POA: Diagnosis not present

## 2024-05-23 ENCOUNTER — Ambulatory Visit (INDEPENDENT_AMBULATORY_CARE_PROVIDER_SITE_OTHER): Admitting: Internal Medicine

## 2024-05-23 ENCOUNTER — Encounter: Payer: Self-pay | Admitting: Internal Medicine

## 2024-05-23 ENCOUNTER — Ambulatory Visit: Payer: Self-pay | Admitting: Internal Medicine

## 2024-05-23 VITALS — BP 130/70 | HR 58 | Temp 97.7°F | Ht 71.0 in | Wt 235.2 lb

## 2024-05-23 DIAGNOSIS — I10 Essential (primary) hypertension: Secondary | ICD-10-CM | POA: Diagnosis not present

## 2024-05-23 DIAGNOSIS — E7800 Pure hypercholesterolemia, unspecified: Secondary | ICD-10-CM

## 2024-05-23 DIAGNOSIS — E538 Deficiency of other specified B group vitamins: Secondary | ICD-10-CM

## 2024-05-23 DIAGNOSIS — R972 Elevated prostate specific antigen [PSA]: Secondary | ICD-10-CM

## 2024-05-23 DIAGNOSIS — Z1211 Encounter for screening for malignant neoplasm of colon: Secondary | ICD-10-CM

## 2024-05-23 DIAGNOSIS — R739 Hyperglycemia, unspecified: Secondary | ICD-10-CM

## 2024-05-23 DIAGNOSIS — E559 Vitamin D deficiency, unspecified: Secondary | ICD-10-CM | POA: Diagnosis not present

## 2024-05-23 LAB — CBC WITH DIFFERENTIAL/PLATELET
Basophils Absolute: 0 K/uL (ref 0.0–0.1)
Basophils Relative: 0.6 % (ref 0.0–3.0)
Eosinophils Absolute: 0.1 K/uL (ref 0.0–0.7)
Eosinophils Relative: 1 % (ref 0.0–5.0)
HCT: 46 % (ref 39.0–52.0)
Hemoglobin: 15.5 g/dL (ref 13.0–17.0)
Lymphocytes Relative: 31.8 % (ref 12.0–46.0)
Lymphs Abs: 2 K/uL (ref 0.7–4.0)
MCHC: 33.6 g/dL (ref 30.0–36.0)
MCV: 91.7 fl (ref 78.0–100.0)
Monocytes Absolute: 0.6 K/uL (ref 0.1–1.0)
Monocytes Relative: 9.2 % (ref 3.0–12.0)
Neutro Abs: 3.6 K/uL (ref 1.4–7.7)
Neutrophils Relative %: 57.4 % (ref 43.0–77.0)
Platelets: 151 K/uL (ref 150.0–400.0)
RBC: 5.02 Mil/uL (ref 4.22–5.81)
RDW: 13.6 % (ref 11.5–15.5)
WBC: 6.3 K/uL (ref 4.0–10.5)

## 2024-05-23 LAB — BASIC METABOLIC PANEL WITH GFR
BUN: 11 mg/dL (ref 6–23)
CO2: 31 meq/L (ref 19–32)
Calcium: 9 mg/dL (ref 8.4–10.5)
Chloride: 95 meq/L — ABNORMAL LOW (ref 96–112)
Creatinine, Ser: 0.78 mg/dL (ref 0.40–1.50)
GFR: 89.45 mL/min (ref >60.00)
Glucose, Bld: 92 mg/dL (ref 70–99)
Potassium: 4.5 meq/L (ref 3.5–5.1)
Sodium: 133 meq/L — ABNORMAL LOW (ref 135–145)

## 2024-05-23 LAB — TSH: TSH: 1.69 u[IU]/mL (ref 0.35–5.50)

## 2024-05-23 LAB — LIPID PANEL
Cholesterol: 170 mg/dL (ref 0–200)
HDL: 57.4 mg/dL (ref >39.00)
LDL Cholesterol: 84 mg/dL (ref 0–99)
NonHDL: 112.46
Total CHOL/HDL Ratio: 3
Triglycerides: 140 mg/dL (ref 0.0–149.0)
VLDL: 28 mg/dL (ref 0.0–40.0)

## 2024-05-23 LAB — HEPATIC FUNCTION PANEL
ALT: 9 U/L (ref 0–53)
AST: 11 U/L (ref 0–37)
Albumin: 4.2 g/dL (ref 3.5–5.2)
Alkaline Phosphatase: 71 U/L (ref 39–117)
Bilirubin, Direct: 0.1 mg/dL (ref 0.0–0.3)
Total Bilirubin: 0.6 mg/dL (ref 0.2–1.2)
Total Protein: 6.7 g/dL (ref 6.0–8.3)

## 2024-05-23 LAB — VITAMIN D 25 HYDROXY (VIT D DEFICIENCY, FRACTURES): VITD: 27.42 ng/mL — ABNORMAL LOW (ref 30.00–100.00)

## 2024-05-23 LAB — HEMOGLOBIN A1C: Hgb A1c MFr Bld: 5.8 % (ref 4.6–6.5)

## 2024-05-23 LAB — PSA: PSA: 2.64 ng/mL (ref 0.10–4.00)

## 2024-05-23 MED ORDER — LOSARTAN POTASSIUM-HCTZ 50-12.5 MG PO TABS: 1.0000 | ORAL_TABLET | Freq: Every day | ORAL | 3 refills | Status: DC

## 2024-05-23 MED ORDER — FAMOTIDINE 20 MG PO TABS: 20.0000 mg | ORAL_TABLET | Freq: Every day | ORAL | 3 refills | Status: AC

## 2024-05-23 MED ORDER — ALPRAZOLAM 1 MG PO TABS: ORAL_TABLET | ORAL | 2 refills | Status: DC

## 2024-05-23 MED ORDER — CYANOCOBALAMIN 1000 MCG/ML IJ SOLN: INTRAMUSCULAR | 3 refills | Status: AC

## 2024-05-23 NOTE — Assessment & Plan Note (Signed)
 Lab Results  Component Value Date   VITAMINB12 846 12/07/2023   Stable, cont oral replacement - b12 1000 mcg qd

## 2024-05-23 NOTE — Assessment & Plan Note (Signed)
 Last vitamin D Lab Results  Component Value Date   VD25OH 22.05 (L) 12/07/2023   Low, to start oral replacement

## 2024-05-23 NOTE — Assessment & Plan Note (Signed)
 Lab Results  Component Value Date   PSA 3.35 12/07/2023   PSA 3.21 05/15/2023   PSA 4.07 (H) 11/13/2022   Also for f/u lab today

## 2024-05-23 NOTE — Assessment & Plan Note (Signed)
 BP Readings from Last 3 Encounters:  05/23/24 130/70  01/14/24 (!) 122/97  01/10/24 126/76   Stable, pt to continue medical treatment hyzaar 50 12.5 qd

## 2024-05-23 NOTE — Progress Notes (Signed)
 The test results show that your current treatment is OK, as the tests are stable.  Please continue the same plan.  There is no other need for change of treatment or further evaluation based on these results, at this time.  thanks

## 2024-05-23 NOTE — Assessment & Plan Note (Signed)
 Lab Results  Component Value Date   LDLCALC 84 12/07/2023   Uncontrolled, pt to consider statin with labs today

## 2024-05-23 NOTE — Patient Instructions (Signed)
 Please continue all other medications as before, and refills have been done if requested.  Please have the pharmacy call with any other refills you may need.  Please continue your efforts at being more active, low cholesterol diet, and weight control  Please keep your appointments with your specialists as you may have planned  You will be contacted regarding the referral for: Colonoscopy for Oct 2025 with Dr Shila  Please go to the LAB at the blood drawing area for the tests to be done  You will be contacted by phone if any changes need to be made immediately.  Otherwise, you will receive a letter about your results with an explanation, but please check with MyChart first.  Please make an Appointment to return in 6 months, or sooner if needed

## 2024-05-23 NOTE — Progress Notes (Signed)
 Patient ID: Maitland Lesiak, male   DOB: Jan 26, 1951, 73 y.o.   MRN: [id number]        Chief Complaint: follow up HTN, HLD and hyperglycemia, low vit d and b12, elevated PSA       HPI:  Nyshawn Rylan Bernard is a 73 y.o. male here overall doing ok, Pt denies chest pain, increased sob or doe, wheezing, orthopnea, PND, increased LE swelling, palpitations, dizziness or syncope.   Pt denies polydipsia, polyuria, or new focal neuro s/s.    Pt denies fever, wt loss, night sweats, loss of appetite, or other constitutional symptoms   Saw urology and neurology, and occas chiropracter, as well as optho and cardiology.  Denies urinary symptoms such as dysuria, frequency, urgency, flank pain, hematuria or n/v, fever, chills.  Due for colonoscopy oct 2025 with Dr Shila Done Readings from Last 3 Encounters:  05/23/24 235 lb 4 oz (106.7 kg)  01/10/24 230 lb (104.3 kg)  12/28/23 229 lb 12.8 oz (104.2 kg)   BP Readings from Last 3 Encounters:  05/23/24 130/70  01/14/24 (!) 122/97  01/10/24 126/76         Past Medical History:  Diagnosis Date   Allergic rhinitis    Anemia, pernicious    Anxiety and depression    sees Dr. Vincente   Arthritis    B12 deficiency 01/23/2020   Catheter-associated urinary tract infection (HCC)    Diverticulosis of colon    Elevated PSA    and hypogonadism, sees urologist   GERD (gastroesophageal reflux disease)    hiatal hernia   Hx of blood transfusion reaction 1999   Hyperlipidemia    Hyperparathyroidism (HCC)    Hyperplastic colon polyp    Hypertension    acei causes cough   IBS (irritable bowel syndrome)    Nephrolithiasis    Obstructive chronic bronchitis without exacerbation, followed by Dr. Brien 03/04/2009   ONO RA  Normal 07/28/11 6 min walk >535m no desat PFTs 08/08/11: DLCO 70% TLC 80%  FeV1 80%   Fef 25 75 56% with significant improvement after BD    Peritonitis (HCC)    after surgery in 1999   Pneumonia    PONV (postoperative nausea and  vomiting)    Rectal fissure    Trigeminal neuralgia    has facial asymetry   Ventral hernia    Past Surgical History:  Procedure Laterality Date   ABDOMINAL SURGERY     Multiple   BRAIN SURGERY  01/2011   Trigeminal nerve/Dr Unice   CHOLECYSTECTOMY  1981   DENTAL SURGERY  2019   tooth extraction, root canal   ESOPHAGEAL MANOMETRY N/A 04/28/2020   Procedure: ESOPHAGEAL MANOMETRY (EM);  Surgeon: Shila Gustav GAILS, MD;  Location: [facility];  Service: Endoscopy;  Laterality: N/A;   HAND SURGERY  1973   Left   HERNIA REPAIR  1999   KNEE SURGERY  1997   Left   LITHOTRIPSY     Right   NISSEN FUNDOPLICATION  1999    complicated by gastric perforation, peritonitis, ventral nernia    RHIZOTOMY Right 11/17/2014   Procedure: RHIZOTOMY TO RIGHT V2,V3;  Surgeon: Fairy Unice, MD;  Location: [facility];  Service: Neurosurgery;  Laterality: Right;  right   RHIZOTOMY Right 12/22/2014   Procedure: Right V2 and V3 trigeminal rhysolysis;  Surgeon: Fairy Unice, MD;  Location: [facility];  Service: Neurosurgery;  Laterality: Right;  Right V2 and V3 trigeminal rhysolysis   RHIZOTOMY W/ RADIOFREQUENCY ABLATION  08/2017   Dr Mindi   UPPER GASTROINTESTINAL ENDOSCOPY     VENTRAL HERNIA REPAIR  2004    reports that he quit smoking about 27 years ago. His smoking use included cigarettes. He started smoking about 47 years ago. He has a 20 pack-year smoking history. He quit smokeless tobacco use about 40 years ago.  His smokeless tobacco use included chew. He reports that he does not drink alcohol and does not use drugs. family history includes Colon polyps in his mother; Coronary artery disease in his mother; Diabetes in his brother, mother, and sister; Heart disease in his brother and brother; Hypertension in his brother. Allergies  Allergen Reactions   Dilantin  [Phenytoin ] Other (See Comments)    bradycardia   Dilaudid  [Hydromorphone  Hcl] Nausea And Vomiting   Sulfa Antibiotics Hives    Morphine     Ace Inhibitors Cough   Augmentin [Amoxicillin -Pot Clavulanate] Nausea And Vomiting   Codeine Nausea And Vomiting   Current Outpatient Medications on File Prior to Visit  Medication Sig Dispense Refill   acetaminophen  (TYLENOL ) 325 MG tablet Take 2 tablets (650 mg total) by mouth every 6 (six) hours as needed for mild pain, moderate pain or fever.     carbamazepine  (TEGRETOL  XR) 100 MG 12 hr tablet Take 100-200 mg by mouth See admin instructions. 100mg  in the morning and 200mg  at night.     dexlansoprazole  (DEXILANT ) 60 MG capsule TAKE ONE CAPSULE BY MOUTH DAILY 30 capsule 5   esomeprazole  (NEXIUM ) 40 MG capsule Take 1 capsule (40 mg total) by mouth 2 (two) times daily before a meal. 180 capsule 3   finasteride  (PROSCAR ) 5 MG tablet Take 1 tablet (5 mg total) by mouth daily. 90 tablet 3   gabapentin  (NEURONTIN ) 400 MG capsule Take 400-800 mg by mouth See admin instructions. Taking 400mg  at 0800, 1400, then 2 capsules (800mg ) at bedtime.     metoprolol  tartrate (LOPRESSOR ) 50 MG tablet Take 1 tablet (50 mg total) by mouth once for 1 dose. Take 90-120 minutes prior to scan. Hold for systolic blood pressure (top number) less than 110. 1 tablet 0   polyethylene glycol powder (GLYCOLAX /MIRALAX ) powder DISSOLVE 1 CAPFUL IN LIQUID EVERY DAY AS NEEDED FOR CONSTIPATION 527 g 2   predniSONE  (DELTASONE ) 10 MG tablet 3 tabs by mouth per day for 3 days,2tabs per day for 3 days,1tab per day for 3 days 18 tablet 0   Probiotic Product (ALIGN) 4 MG CAPS Take 4 mg by mouth daily.     sucralfate  (CARAFATE ) 1 g tablet TAKE 1 TABLET BY MOUTH FOUR TIMES DAILY WITH meals AND AT BEDTIME (DO NOT TAKE WITHIN 4 HOURS OF ANY OTHER MEDICATIONS) 60 tablet 1   tamsulosin  (FLOMAX ) 0.4 MG CAPS capsule Take 0.4 mg by mouth at bedtime.     No current facility-administered medications on file prior to visit.        ROS:  All others reviewed and negative.  Objective        PE:  BP 130/70   Pulse (!) 58   Temp 97.7  F (36.5 C) (Temporal)   Ht 5' 11 (1.803 m)   Wt 235 lb 4 oz (106.7 kg)   SpO2 97%   BMI 32.81 kg/m                 Constitutional: Pt appears in NAD               HENT: Head: NCAT.  Right Ear: External ear normal.                 Left Ear: External ear normal.                Eyes: . Pupils are equal, round, and reactive to light. Conjunctivae and EOM are normal               Nose: without d/c or deformity               Neck: Neck supple. Gross normal ROM               Cardiovascular: Normal rate and regular rhythm.                 Pulmonary/Chest: Effort normal and breath sounds without rales or wheezing.                Abd:  Soft, NT, ND, + BS, no organomegaly               Neurological: Pt is alert. At baseline orientation, motor grossly intact               Skin: Skin is warm. No rashes, no other new lesions, LE edema - trace ankle bilateral               Psychiatric: Pt behavior is normal without agitation   Micro: none  Cardiac tracings I have personally interpreted today:  none  Pertinent Radiological findings (summarize): none   Lab Results  Component Value Date   WBC 7.1 12/07/2023   HGB 15.9 12/07/2023   HCT 45.9 12/07/2023   PLT 150.0 12/07/2023   GLUCOSE 136 (H) 12/28/2023   CHOL 161 12/07/2023   TRIG 86.0 12/07/2023   HDL 59.70 12/07/2023   LDLCALC 84 12/07/2023   ALT 9 12/07/2023   AST 12 12/07/2023   NA 132 (L) 12/28/2023   K 4.0 12/28/2023   CL 94 (L) 12/28/2023   CREATININE 0.81 12/28/2023   BUN 10 12/28/2023   CO2 21 12/28/2023   TSH 1.77 12/07/2023   PSA 3.35 12/07/2023   HGBA1C 5.6 12/07/2023   Assessment/Plan:  Deagan Sevin is a 73 y.o. White or Caucasian [1] male with  has a past medical history of Allergic rhinitis, Anemia, pernicious, Anxiety and depression, Arthritis, B12 deficiency (01/23/2020), Catheter-associated urinary tract infection (HCC), Diverticulosis of colon, Elevated PSA, GERD (gastroesophageal reflux  disease), blood transfusion reaction (1999), Hyperlipidemia, Hyperparathyroidism (HCC), Hyperplastic colon polyp, Hypertension, IBS (irritable bowel syndrome), Nephrolithiasis, Obstructive chronic bronchitis without exacerbation, followed by Dr. Brien (03/04/2009), Peritonitis (HCC), Pneumonia, PONV (postoperative nausea and vomiting), Rectal fissure, Trigeminal neuralgia, and Ventral hernia.  Vitamin D  deficiency Last vitamin D  Lab Results  Component Value Date   VD25OH 22.05 (L) 12/07/2023   Low, to start oral replacement   Hyperglycemia Lab Results  Component Value Date   HGBA1C 5.6 12/07/2023   Stable, pt to continue current medical treatment  - diet, wt control   HLD (hyperlipidemia) Lab Results  Component Value Date   LDLCALC 84 12/07/2023   Uncontrolled, pt to consider statin with labs today   Essential hypertension BP Readings from Last 3 Encounters:  05/23/24 130/70  01/14/24 (!) 122/97  01/10/24 126/76   Stable, pt to continue medical treatment hyzaar 50 12.5 qd   Elevated PSA Lab Results  Component Value Date   PSA 3.35 12/07/2023   PSA 3.21 05/15/2023   PSA 4.07 (H) 11/13/2022  Also for f/u lab today  B12 deficiency Lab Results  Component Value Date   VITAMINB12 846 12/07/2023   Stable, cont oral replacement - b12 1000 mcg qd  Followup: Return in about 6 months (around 11/23/2024).  Lynwood Rush, MD 05/23/2024 10:49 AM [facility]

## 2024-05-23 NOTE — Assessment & Plan Note (Signed)
 Lab Results  Component Value Date   HGBA1C 5.6 12/07/2023   Stable, pt to continue current medical treatment  - diet, wt control

## 2024-05-28 ENCOUNTER — Ambulatory Visit: Payer: Self-pay

## 2024-05-28 ENCOUNTER — Encounter: Payer: Self-pay | Admitting: Emergency Medicine

## 2024-05-28 ENCOUNTER — Ambulatory Visit: Admitting: Emergency Medicine

## 2024-05-28 VITALS — BP 144/102 | HR 62 | Temp 97.6°F | Ht 71.0 in | Wt 235.0 lb

## 2024-05-28 DIAGNOSIS — I10 Essential (primary) hypertension: Secondary | ICD-10-CM

## 2024-05-28 NOTE — Progress Notes (Signed)
 [name] 73 y.o.   Chief Complaint  Patient presents with   Hypertension    Patient here for elevated b/p. Patient states before coming to his appt it was 180/90. Patient is having head aches. Pt mentions have a hernia at his chest and is having some pain.     HISTORY OF PRESENT ILLNESS: Acute problem visit today.  Patient of Dr. Lynwood Rush. This is a 73 y.o. male complaining of elevated blood pressure readings at home during the past couple days Has occasional headaches. Has pain in the upper abdomen but thinks this is secondary to ventral/incisional hernia from previous surgery. No other complaints or medical concerns today. BP Readings from Last 3 Encounters:  05/23/24 130/70  01/14/24 (!) 122/97  01/10/24 126/76     Hypertension Associated symptoms include headaches. Pertinent negatives include no chest pain, palpitations or shortness of breath.     Prior to Admission medications   Medication Sig Start Date End Date Taking? Authorizing Provider  acetaminophen  (TYLENOL ) 325 MG tablet Take 2 tablets (650 mg total) by mouth every 6 (six) hours as needed for mild pain, moderate pain or fever. 12/26/19  Yes Vicci Sor R, PA-C  ALPRAZolam  (XANAX ) 1 MG tablet TAKE 1 TABLET BY MOUTH FOUR TIMES DAILY AS NEEDED FOR ANXIETY 05/23/24  Yes Rush Lynwood ORN, MD  carbamazepine  (TEGRETOL  XR) 100 MG 12 hr tablet Take 100-200 mg by mouth See admin instructions. 100mg  in the morning and 200mg  at night.   Yes [provider]  cyanocobalamin  (VITAMIN B12) 1000 MCG/ML injection INJECT 1 ML every 7 days 05/23/24  Yes Rush Lynwood ORN, MD  dexlansoprazole  (DEXILANT ) 60 MG capsule TAKE ONE CAPSULE BY MOUTH DAILY 03/24/24  Yes Kennedy-Smith, Colleen M, NP  esomeprazole  (NEXIUM ) 40 MG capsule Take 1 capsule (40 mg total) by mouth 2 (two) times daily before a meal. 01/10/24  Yes Kennedy-Smith, Colleen M, NP  famotidine  (PEPCID ) 20 MG tablet Take 1 tablet (20 mg total) by mouth at  bedtime. 05/23/24  Yes Rush Lynwood ORN, MD  finasteride  (PROSCAR ) 5 MG tablet Take 1 tablet (5 mg total) by mouth daily. 11/13/22  Yes Rush Lynwood ORN, MD  gabapentin  (NEURONTIN ) 400 MG capsule Take 400-800 mg by mouth See admin instructions. Taking 400mg  at 0800, 1400, then 2 capsules (800mg ) at bedtime. 06/16/20  Yes [provider]  losartan -hydrochlorothiazide  (HYZAAR) 50-12.5 MG tablet Take 1 tablet by mouth daily. 05/23/24  Yes Rush Lynwood ORN, MD  metoprolol  tartrate (LOPRESSOR ) 50 MG tablet Take 1 tablet (50 mg total) by mouth once for 1 dose. Take 90-120 minutes prior to scan. Hold for systolic blood pressure (top number) less than 110. 12/28/23 05/28/24 Yes Nishan, Peter C, MD  polyethylene glycol powder (GLYCOLAX /MIRALAX ) powder DISSOLVE 1 CAPFUL IN LIQUID EVERY DAY AS NEEDED FOR CONSTIPATION 02/18/14  Yes Luke Chiquita SAUNDERS, DO  predniSONE  (DELTASONE ) 10 MG tablet 3 tabs by mouth per day for 3 days,2tabs per day for 3 days,1tab per day for 3 days 12/07/23  Yes Rush Lynwood ORN, MD  Probiotic Product (ALIGN) 4 MG CAPS Take 4 mg by mouth daily.   Yes [provider]  sucralfate  (CARAFATE ) 1 g tablet TAKE 1 TABLET BY MOUTH FOUR TIMES DAILY WITH meals AND AT BEDTIME (DO NOT TAKE WITHIN 4 HOURS OF ANY OTHER MEDICATIONS) 04/14/24  Yes Cara Elida HERO, NP  tamsulosin  (FLOMAX ) 0.4 MG CAPS capsule Take 0.4 mg by mouth at bedtime. 06/21/20  Yes [provider]  Allergies  Allergen Reactions   Dilantin  [Phenytoin ] Other (See Comments)    bradycardia   Dilaudid  [Hydromorphone  Hcl] Nausea And Vomiting   Sulfa Antibiotics Hives   Morphine     Ace Inhibitors Cough   Augmentin [Amoxicillin -Pot Clavulanate] Nausea And Vomiting   Codeine Nausea And Vomiting    Patient Active Problem List   Diagnosis Date Noted   Low back pain 01/31/2023   Left sided abdominal pain 04/27/2022   History of diverticulitis 04/27/2022   Elevated PSA 11/11/2021   Small bowel obstruction (HCC) 05/15/2021    Vitamin D  deficiency 05/05/2021   Aortic atherosclerosis (HCC) 02/02/2021   Strain of neck muscle 12/15/2020   History of trigeminal neuralgia 06/21/2020   Orthostatic lightheadedness 06/21/2020   Neck pain 06/21/2020   Weakness of masseter muscle 06/16/2020   Diuretic-induced hypokalemia 04/16/2020   Lumbar radiculopathy, right 02/03/2020   Right leg pain 01/25/2020   B12 deficiency 01/23/2020   HLD (hyperlipidemia) 01/23/2020   Urinary retention 01/11/2020   COVID-19 virus infection 12/24/2019   SBO (small bowel obstruction) (HCC) 12/20/2019   Body mass index (BMI) 31.0-31.9, adult 12/16/2019   Multifocal pneumonia 09/12/2017   Enteritis 09/12/2017   Hyponatremia 09/12/2017   CAP (community acquired pneumonia) 09/12/2017   Weakness of jaw muscles 08/21/2017   Acute sinusitis 07/06/2017   Acute recurrent pansinusitis 01/26/2017   Hyperglycemia 01/18/2017   Maxillary sinusitis 01/03/2017   Encounter for well adult exam with abnormal findings 01/13/2016   Absolute anemia 10/15/2015   Cervical spondylosis, treated by Dr. Heide, [facility] 06/12/2013   Ventral hernia 03/04/2013   Anxiety and depression, followed by Dr. Vincente in Psych 11/19/2012   Hiatal hernia, followed by Dr. Gladis ([facility]) and Dr. Obie (GI) 11/19/2012   Other testicular hypofunction, followed by Dr. Ottelin in Urology 10/15/2012   Trigeminal neuralgia, s/p NSU, followed by Dr. Unice 10/15/2012   BPH (benign prostatic hyperplasia), followed by Dr. Ottelin 07/26/2011   Rosacea 09/02/2010   History of colonic polyps 12/13/2009   Obstructive chronic bronchitis without exacerbation, followed by Dr. Brien 03/04/2009   Allergic rhinitis 04/03/2008   Essential hypertension 05/08/2007   GERD followed by Dr. Obie 05/08/2007    Past Medical History:  Diagnosis Date   Allergic rhinitis    Anemia, pernicious    Anxiety and depression    sees Dr. Vincente   Arthritis    B12 deficiency 01/23/2020    Catheter-associated urinary tract infection (HCC)    Diverticulosis of colon    Elevated PSA    and hypogonadism, sees urologist   GERD (gastroesophageal reflux disease)    hiatal hernia   Hx of blood transfusion reaction 1999   Hyperlipidemia    Hyperparathyroidism (HCC)    Hyperplastic colon polyp    Hypertension    acei causes cough   IBS (irritable bowel syndrome)    Nephrolithiasis    Obstructive chronic bronchitis without exacerbation, followed by Dr. Brien 03/04/2009   ONO RA  Normal 07/28/11 6 min walk >521m no desat PFTs 08/08/11: DLCO 70% TLC 80%  FeV1 80%   Fef 25 75 56% with significant improvement after BD    Peritonitis (HCC)    after surgery in 1999   Pneumonia    PONV (postoperative nausea and vomiting)    Rectal fissure    Trigeminal neuralgia    has facial asymetry   Ventral hernia     Past Surgical History:  Procedure Laterality Date   ABDOMINAL SURGERY     Multiple  BRAIN SURGERY  01/2011   Trigeminal nerve/Dr Unice   CHOLECYSTECTOMY  1981   DENTAL SURGERY  2019   tooth extraction, root canal   ESOPHAGEAL MANOMETRY N/A 04/28/2020   Procedure: ESOPHAGEAL MANOMETRY (EM);  Surgeon: Shila Gustav GAILS, MD;  Location: [facility];  Service: Endoscopy;  Laterality: N/A;   HAND SURGERY  1973   Left   HERNIA REPAIR  1999   KNEE SURGERY  1997   Left   LITHOTRIPSY     Right   NISSEN FUNDOPLICATION  1999    complicated by gastric perforation, peritonitis, ventral nernia    RHIZOTOMY Right 11/17/2014   Procedure: RHIZOTOMY TO RIGHT V2,V3;  Surgeon: Fairy Unice, MD;  Location: [facility];  Service: Neurosurgery;  Laterality: Right;  right   RHIZOTOMY Right 12/22/2014   Procedure: Right V2 and V3 trigeminal rhysolysis;  Surgeon: Fairy Unice, MD;  Location: [facility];  Service: Neurosurgery;  Laterality: Right;  Right V2 and V3 trigeminal rhysolysis   RHIZOTOMY W/ RADIOFREQUENCY ABLATION  08/2017   Dr Mindi   UPPER GASTROINTESTINAL ENDOSCOPY      VENTRAL HERNIA REPAIR  2004    Social History   Socioeconomic History   Marital status: Married    Spouse name: Ricka   Number of children: 2   Years of education: Not on file   Highest education level: Not on file  Occupational History   Occupation: Disabled - psych  Tobacco Use   Smoking status: Former    Current packs/day: 0.00    Average packs/day: 1 pack/day for 20.0 years (20.0 ttl pk-yrs)    Types: Cigarettes    Start date: 11/06/1976    Quit date: 11/06/1996    Years since quitting: 27.5   Smokeless tobacco: Former    Types: Chew    Quit date: 11/07/1983  Vaping Use   Vaping status: Never Used  Substance and Sexual Activity   Alcohol use: No   Drug use: No   Sexual activity: Yes    Partners: Female  Other Topics Concern   Not on file  Social History Narrative   Daily Caffeine Use:  Yes         Social Drivers of Health   Financial Resource Strain: Medium Risk (07/05/2023)   Overall Financial Resource Strain (CARDIA)    Difficulty of Paying Living Expenses: Somewhat hard  Food Insecurity: No Food Insecurity (07/05/2023)   Hunger Vital Sign    Worried About Running Out of Food in the Last Year: Never true    Ran Out of Food in the Last Year: Never true  Transportation Needs: No Transportation Needs (07/05/2023)   PRAPARE - Administrator, Civil Service (Medical): No    Lack of Transportation (Non-Medical): No  Physical Activity: Sufficiently Active (07/05/2023)   Exercise Vital Sign    Days of Exercise per Week: 7 days    Minutes of Exercise per Session: 40 min  Stress: No Stress Concern Present (07/05/2023)   [employer] of Occupational Health - Occupational Stress Questionnaire    Feeling of Stress : Not at all  Social Connections: Moderately Isolated (07/05/2023)   Social Connection and Isolation Panel    Frequency of Communication with Friends and Family: More than three times a week    Frequency of Social Gatherings with Friends and Family:  Never    Attends Religious Services: Never    Database administrator or Organizations: No    Attends Banker Meetings:  Never    Marital Status: Married  Catering manager Violence: Not At Risk (07/05/2023)   Humiliation, Afraid, Rape, and Kick questionnaire    Fear of Current or Ex-Partner: No    Emotionally Abused: No    Physically Abused: No    Sexually Abused: No    Family History  Problem Relation Age of Onset   Colon polyps Mother    Diabetes Mother    Coronary artery disease Mother        CABG   Diabetes Sister    Diabetes Brother    Hypertension Brother    Heart disease Brother    Heart disease Brother    Colon cancer Neg Hx    Esophageal cancer Neg Hx    Stomach cancer Neg Hx    Pancreatic cancer Neg Hx      Review of Systems  Constitutional: Negative.  Negative for chills and fever.  HENT: Negative.  Negative for congestion and sore throat.   Respiratory: Negative.  Negative for cough and shortness of breath.   Cardiovascular: Negative.  Negative for chest pain and palpitations.  Gastrointestinal:  Negative for abdominal pain, diarrhea, nausea and vomiting.  Genitourinary: Negative.  Negative for dysuria and hematuria.  Skin: Negative.  Negative for rash.  Neurological:  Positive for headaches. Negative for dizziness.  All other systems reviewed and are negative.   Today's Vitals   05/28/24 1257  BP: (!) 144/102  Pulse: 62  Temp: 97.6 F (36.4 C)  TempSrc: Oral  SpO2: 96%  Weight: 235 lb (106.6 kg)  Height: 5' 11 (1.803 m)   Body mass index is 32.78 kg/m.   Physical Exam Vitals reviewed.  Constitutional:      Appearance: Normal appearance.  HENT:     Head: Normocephalic.     Mouth/Throat:     Mouth: Mucous membranes are moist.     Pharynx: Oropharynx is clear.  Eyes:     Extraocular Movements: Extraocular movements intact.     Pupils: Pupils are equal, round, and reactive to light.  Cardiovascular:     Rate and Rhythm: Normal  rate and regular rhythm.     Pulses: Normal pulses.     Heart sounds: Normal heart sounds.  Pulmonary:     Effort: Pulmonary effort is normal.     Breath sounds: Normal breath sounds.  Abdominal:     Palpations: Abdomen is soft.     Tenderness: There is no abdominal tenderness.     Hernia: A hernia is present.  Musculoskeletal:     Cervical back: No tenderness.     Right lower leg: No edema.     Left lower leg: No edema.  Lymphadenopathy:     Cervical: No cervical adenopathy.  Skin:    General: Skin is warm and dry.     Capillary Refill: Capillary refill takes less than 2 seconds.  Neurological:     General: No focal deficit present.     Mental Status: He is alert and oriented to person, place, and time.  Psychiatric:        Mood and Affect: Mood normal.        Behavior: Behavior normal.   EKG: Normal sinus rhythm with ventricular rate of 61/min.  No acute ischemic changes.  Normal EKG.   ASSESSMENT & PLAN: A total of 42 minutes was spent with the patient and counseling/coordination of care regarding preparing for this visit, review of most recent office visit notes, review of all medications and chronic  medical conditions under management, diagnosis of hypertension and cardiovascular risks associated with this condition, hypertension management and change in medication, review of most recent blood work results, prognosis, ED precautions, documentation, and need for follow-up with PCP.  Problem List Items Addressed This Visit       Cardiovascular and Mediastinum   Essential hypertension - Primary   Uncontrolled hypertension Clinical stable and mostly asymptomatic.  No stroke symptoms or findings. Normal EKG.  No signs of ischemia. Normal recent blood work.  Results reviewed. No red flag signs or symptoms No clear triggering factor Cardiovascular risks associated with hypertension discussed Recommend to increase dose of losartan  HCT Advised to take 2 tablets daily and  continue monitoring blood pressure readings at home daily for the next couple of weeks and contact the office if numbers persistently abnormal ED precautions given      Relevant Orders   EKG 12-Lead   Patient Instructions  Increase losartan  dose.  Take 2 tablets daily and continue monitoring blood pressure readings at home daily for the next couple weeks.  Contact the office if numbers persistently abnormal. Follow-up with PCP within the next 2 weeks  Hypertension, Adult High blood pressure (hypertension) is when the force of blood pumping through the arteries is too strong. The arteries are the blood vessels that carry blood from the heart throughout the body. Hypertension forces the heart to work harder to pump blood and may cause arteries to become narrow or stiff. Untreated or uncontrolled hypertension can lead to a heart attack, heart failure, a stroke, kidney disease, and other problems. A blood pressure reading consists of a higher number over a lower number. Ideally, your blood pressure should be below 120/80. The first (top) number is called the systolic pressure. It is a measure of the pressure in your arteries as your heart beats. The second (bottom) number is called the diastolic pressure. It is a measure of the pressure in your arteries as the heart relaxes. What are the causes? The exact cause of this condition is not known. There are some conditions that result in high blood pressure. What increases the risk? Certain factors may make you more likely to develop high blood pressure. Some of these risk factors are under your control, including: Smoking. Not getting enough exercise or physical activity. Being overweight. Having too much fat, sugar, calories, or salt (sodium) in your diet. Drinking too much alcohol. Other risk factors include: Having a personal history of heart disease, diabetes, high cholesterol, or kidney disease. Stress. Having a family history of high  blood pressure and high cholesterol. Having obstructive sleep apnea. Age. The risk increases with age. What are the signs or symptoms? High blood pressure may not cause symptoms. Very high blood pressure (hypertensive crisis) may cause: Headache. Fast or irregular heartbeats (palpitations). Shortness of breath. Nosebleed. Nausea and vomiting. Vision changes. Severe chest pain, dizziness, and seizures. How is this diagnosed? This condition is diagnosed by measuring your blood pressure while you are seated, with your arm resting on a flat surface, your legs uncrossed, and your feet flat on the floor. The cuff of the blood pressure monitor will be placed directly against the skin of your upper arm at the level of your heart. Blood pressure should be measured at least twice using the same arm. Certain conditions can cause a difference in blood pressure between your right and left arms. If you have a high blood pressure reading during one visit or you have normal blood pressure with other  risk factors, you may be asked to: Return on a different day to have your blood pressure checked again. Monitor your blood pressure at home for 1 week or longer. If you are diagnosed with hypertension, you may have other blood or imaging tests to help your health care provider understand your overall risk for other conditions. How is this treated? This condition is treated by making healthy lifestyle changes, such as eating healthy foods, exercising more, and reducing your alcohol intake. You may be referred for counseling on a healthy diet and physical activity. Your health care provider may prescribe medicine if lifestyle changes are not enough to get your blood pressure under control and if: Your systolic blood pressure is above 130. Your diastolic blood pressure is above 80. Your personal target blood pressure may vary depending on your medical conditions, your age, and other factors. Follow these  instructions at home: Eating and drinking  Eat a diet that is high in fiber and potassium, and low in sodium, added sugar, and fat. An example of this eating plan is called the DASH diet. DASH stands for Dietary Approaches to Stop Hypertension. To eat this way: Eat plenty of fresh fruits and vegetables. Try to fill one half of your plate at each meal with fruits and vegetables. Eat whole grains, such as whole-wheat pasta, brown rice, or whole-grain bread. Fill about one fourth of your plate with whole grains. Eat or drink low-fat dairy products, such as skim milk or low-fat yogurt. Avoid fatty cuts of meat, processed or cured meats, and poultry with skin. Fill about one fourth of your plate with lean proteins, such as fish, chicken without skin, beans, eggs, or tofu. Avoid pre-made and processed foods. These tend to be higher in sodium, added sugar, and fat. Reduce your daily sodium intake. Many people with hypertension should eat less than 1,500 mg of sodium a day. Do not drink alcohol if: Your health care provider tells you not to drink. You are pregnant, may be pregnant, or are planning to become pregnant. If you drink alcohol: Limit how much you have to: 0-1 drink a day for women. 0-2 drinks a day for men. Know how much alcohol is in your drink. In the [place] one drink equals one 12 oz bottle of beer (355 mL), one 5 oz glass of wine (148 mL), or one 1 oz glass of hard liquor (44 mL). Lifestyle  Work with your health care provider to maintain a healthy body weight or to lose weight. Ask what an ideal weight is for you. Get at least 30 minutes of exercise that causes your heart to beat faster (aerobic exercise) most days of the week. Activities may include walking, swimming, or biking. Include exercise to strengthen your muscles (resistance exercise), such as Pilates or lifting weights, as part of your weekly exercise routine. Try to do these types of exercises for 30 minutes at least 3 days  a week. Do not use any products that contain nicotine or tobacco. These products include cigarettes, chewing tobacco, and vaping devices, such as e-cigarettes. If you need help quitting, ask your health care provider. Monitor your blood pressure at home as told by your health care provider. Keep all follow-up visits. This is important. Medicines Take over-the-counter and prescription medicines only as told by your health care provider. Follow directions carefully. Blood pressure medicines must be taken as prescribed. Do not skip doses of blood pressure medicine. Doing this puts you at risk for problems and can make  the medicine less effective. Ask your health care provider about side effects or reactions to medicines that you should watch for. Contact a health care provider if you: Think you are having a reaction to a medicine you are taking. Have headaches that keep coming back (recurring). Feel dizzy. Have swelling in your ankles. Have trouble with your vision. Get help right away if you: Develop a severe headache or confusion. Have unusual weakness or numbness. Feel faint. Have severe pain in your chest or abdomen. Vomit repeatedly. Have trouble breathing. These symptoms may be an emergency. Get help right away. Call 911. Do not wait to see if the symptoms will go away. Do not drive yourself to the hospital. Summary Hypertension is when the force of blood pumping through your arteries is too strong. If this condition is not controlled, it may put you at risk for serious complications. Your personal target blood pressure may vary depending on your medical conditions, your age, and other factors. For most people, a normal blood pressure is less than 120/80. Hypertension is treated with lifestyle changes, medicines, or a combination of both. Lifestyle changes include losing weight, eating a healthy, low-sodium diet, exercising more, and limiting alcohol. This information is not intended  to replace advice given to you by your health care provider. Make sure you discuss any questions you have with your health care provider. Document Revised: 08/30/2021 Document Reviewed: 08/30/2021 Elsevier Patient Education  2024 Elsevier Inc.      Emil Schaumann, MD [facility]

## 2024-05-28 NOTE — Telephone Encounter (Signed)
  FYI Only or Action Required?: FYI only for provider.  Patient was last seen in primary care on 05/23/2024 by Norleen Lynwood ORN, MD.  Called Nurse Triage reporting Hypertension.  Symptoms began yesterday.  Interventions attempted: Prescription medications: bp medication.  Symptoms are: unchanged.  Triage Disposition: See PCP Within 2 Weeks  Patient/caregiver understands and will follow disposition?: Yes           Copied from CRM [id number]. Topic: Clinical - Red Word Triage >> May 28, 2024  8:06 AM Henretta I wrote: Red Word that prompted transfer to Nurse Triage: Patient is having high blood pressure since yesterday Reason for Disposition  [1] Systolic BP >= 130 OR Diastolic >= 80 AND [2] taking BP medications  Answer Assessment - Initial Assessment Questions Recent sinus issues per wife Patient took his bp medicine an hour prior to triage   1. BLOOD PRESSURE: What is your blood pressure? Did you take at least two measurements 5 minutes apart?     Yesterday it was 180s/100s after having a headache & This morning it was 170/104.     161/103 @ 8:13 AM  Left Arm      156/96 @ 8:16 AM   Right Arm 2. ONSET: When did you take your blood pressure?     Yesterday it was 180s/100s after having a headache & This morning it was 170/104.          161/103 @ 8:13 AM  Left Arm           156/96 @ 8:16 AM   Right Arm 3. HOW: How did you take your blood pressure? (e.g., automatic home BP monitor, visiting nurse)     Automatic bp cuff at home 4. HISTORY: Do you have a history of high blood pressure?     Yes 5. MEDICINES: Are you taking any medicines for blood pressure? Have you missed any doses recently?     ----- 6. OTHER SYMPTOMS: Do you have any symptoms? (e.g., blurred vision, chest pain, difficulty breathing, headache, weakness)     Headaches  Protocols used: Blood Pressure - High-A-AH

## 2024-05-28 NOTE — Assessment & Plan Note (Signed)
 Uncontrolled hypertension Clinical stable and mostly asymptomatic.  No stroke symptoms or findings. Normal EKG.  No signs of ischemia. Normal recent blood work.  Results reviewed. No red flag signs or symptoms No clear triggering factor Cardiovascular risks associated with hypertension discussed Recommend to increase dose of losartan  HCT Advised to take 2 tablets daily and continue monitoring blood pressure readings at home daily for the next couple of weeks and contact the office if numbers persistently abnormal ED precautions given

## 2024-05-28 NOTE — Patient Instructions (Signed)
 Increase losartan  dose.  Take 2 tablets daily and continue monitoring blood pressure readings at home daily for the next couple weeks.  Contact the office if numbers persistently abnormal. Follow-up with PCP within the next 2 weeks  Hypertension, Adult High blood pressure (hypertension) is when the force of blood pumping through the arteries is too strong. The arteries are the blood vessels that carry blood from the heart throughout the body. Hypertension forces the heart to work harder to pump blood and may cause arteries to become narrow or stiff. Untreated or uncontrolled hypertension can lead to a heart attack, heart failure, a stroke, kidney disease, and other problems. A blood pressure reading consists of a higher number over a lower number. Ideally, your blood pressure should be below 120/80. The first (top) number is called the systolic pressure. It is a measure of the pressure in your arteries as your heart beats. The second (bottom) number is called the diastolic pressure. It is a measure of the pressure in your arteries as the heart relaxes. What are the causes? The exact cause of this condition is not known. There are some conditions that result in high blood pressure. What increases the risk? Certain factors may make you more likely to develop high blood pressure. Some of these risk factors are under your control, including: Smoking. Not getting enough exercise or physical activity. Being overweight. Having too much fat, sugar, calories, or salt (sodium) in your diet. Drinking too much alcohol. Other risk factors include: Having a personal history of heart disease, diabetes, high cholesterol, or kidney disease. Stress. Having a family history of high blood pressure and high cholesterol. Having obstructive sleep apnea. Age. The risk increases with age. What are the signs or symptoms? High blood pressure may not cause symptoms. Very high blood pressure (hypertensive crisis) may  cause: Headache. Fast or irregular heartbeats (palpitations). Shortness of breath. Nosebleed. Nausea and vomiting. Vision changes. Severe chest pain, dizziness, and seizures. How is this diagnosed? This condition is diagnosed by measuring your blood pressure while you are seated, with your arm resting on a flat surface, your legs uncrossed, and your feet flat on the floor. The cuff of the blood pressure monitor will be placed directly against the skin of your upper arm at the level of your heart. Blood pressure should be measured at least twice using the same arm. Certain conditions can cause a difference in blood pressure between your right and left arms. If you have a high blood pressure reading during one visit or you have normal blood pressure with other risk factors, you may be asked to: Return on a different day to have your blood pressure checked again. Monitor your blood pressure at home for 1 week or longer. If you are diagnosed with hypertension, you may have other blood or imaging tests to help your health care provider understand your overall risk for other conditions. How is this treated? This condition is treated by making healthy lifestyle changes, such as eating healthy foods, exercising more, and reducing your alcohol intake. You may be referred for counseling on a healthy diet and physical activity. Your health care provider may prescribe medicine if lifestyle changes are not enough to get your blood pressure under control and if: Your systolic blood pressure is above 130. Your diastolic blood pressure is above 80. Your personal target blood pressure may vary depending on your medical conditions, your age, and other factors. Follow these instructions at home: Eating and drinking  Eat a diet  that is high in fiber and potassium, and low in sodium, added sugar, and fat. An example of this eating plan is called the DASH diet. DASH stands for Dietary Approaches to Stop  Hypertension. To eat this way: Eat plenty of fresh fruits and vegetables. Try to fill one half of your plate at each meal with fruits and vegetables. Eat whole grains, such as whole-wheat pasta, brown rice, or whole-grain bread. Fill about one fourth of your plate with whole grains. Eat or drink low-fat dairy products, such as skim milk or low-fat yogurt. Avoid fatty cuts of meat, processed or cured meats, and poultry with skin. Fill about one fourth of your plate with lean proteins, such as fish, chicken without skin, beans, eggs, or tofu. Avoid pre-made and processed foods. These tend to be higher in sodium, added sugar, and fat. Reduce your daily sodium intake. Many people with hypertension should eat less than 1,500 mg of sodium a day. Do not drink alcohol if: Your health care provider tells you not to drink. You are pregnant, may be pregnant, or are planning to become pregnant. If you drink alcohol: Limit how much you have to: 0-1 drink a day for women. 0-2 drinks a day for men. Know how much alcohol is in your drink. In the [place] one drink equals one 12 oz bottle of beer (355 mL), one 5 oz glass of wine (148 mL), or one 1 oz glass of hard liquor (44 mL). Lifestyle  Work with your health care provider to maintain a healthy body weight or to lose weight. Ask what an ideal weight is for you. Get at least 30 minutes of exercise that causes your heart to beat faster (aerobic exercise) most days of the week. Activities may include walking, swimming, or biking. Include exercise to strengthen your muscles (resistance exercise), such as Pilates or lifting weights, as part of your weekly exercise routine. Try to do these types of exercises for 30 minutes at least 3 days a week. Do not use any products that contain nicotine or tobacco. These products include cigarettes, chewing tobacco, and vaping devices, such as e-cigarettes. If you need help quitting, ask your health care provider. Monitor your  blood pressure at home as told by your health care provider. Keep all follow-up visits. This is important. Medicines Take over-the-counter and prescription medicines only as told by your health care provider. Follow directions carefully. Blood pressure medicines must be taken as prescribed. Do not skip doses of blood pressure medicine. Doing this puts you at risk for problems and can make the medicine less effective. Ask your health care provider about side effects or reactions to medicines that you should watch for. Contact a health care provider if you: Think you are having a reaction to a medicine you are taking. Have headaches that keep coming back (recurring). Feel dizzy. Have swelling in your ankles. Have trouble with your vision. Get help right away if you: Develop a severe headache or confusion. Have unusual weakness or numbness. Feel faint. Have severe pain in your chest or abdomen. Vomit repeatedly. Have trouble breathing. These symptoms may be an emergency. Get help right away. Call 911. Do not wait to see if the symptoms will go away. Do not drive yourself to the hospital. Summary Hypertension is when the force of blood pumping through your arteries is too strong. If this condition is not controlled, it may put you at risk for serious complications. Your personal target blood pressure may vary depending on  your medical conditions, your age, and other factors. For most people, a normal blood pressure is less than 120/80. Hypertension is treated with lifestyle changes, medicines, or a combination of both. Lifestyle changes include losing weight, eating a healthy, low-sodium diet, exercising more, and limiting alcohol. This information is not intended to replace advice given to you by your health care provider. Make sure you discuss any questions you have with your health care provider. Document Revised: 08/30/2021 Document Reviewed: 08/30/2021 Elsevier Patient Education  2024  [employer].

## 2024-05-29 ENCOUNTER — Ambulatory Visit: Payer: Self-pay | Admitting: *Deleted

## 2024-05-29 NOTE — Telephone Encounter (Signed)
 FYI called and spoke with patient's spouse. Advised them to switch back to the one dosage daily and they said with that and additional electrolytes patient has gotten back into the 110s systolic. Cancelled follow up appointment for them tomorrow and rescheduled for next week. They are keeping BP log

## 2024-05-29 NOTE — Telephone Encounter (Signed)
 No, pt should return to 1 per day losartan  HCT and make ROV 1 wk after chekcing BP at least once per day and bring results

## 2024-05-29 NOTE — Telephone Encounter (Signed)
 Copied from CRM [id number]. Topic: Clinical - Red Word Triage >> May 29, 2024  1:01 PM Chasity T wrote: [facility] that prompted transfer to Nurse Triage: Ricka wife of patient is calling regarding her husband blood pressure dropping 99/70 recent pressure at 1:00 pm. Recently saw the dr yesterday and was told to take 2 bp pills. 6:30 am it was 144/90 Reason for Disposition  [1] Systolic BP 90-110 AND [2] taking blood pressure medications AND [3] NOT feeling weak or lightheaded  Answer Assessment - Initial Assessment Questions 1. BLOOD PRESSURE: What is your blood pressure? Did you take at least two measurements 5 minutes apart?     He was seen yesterday and saw Dr. Delynn.    180/101 the last 2-3 days.   144/102 in the office.    166/110 so Dr. Purcell told him to take 2 pills. This morning 6:30 144/90 and took 2 pills at 1:00 PM it's 99/70.  It's dropping his BP too low.   It's losartan .  What should I do?   This is too low. He has a headache for 2-3 days.   2. ONSET: When did you take your blood pressure?     See above 3. HOW: How did you take your blood pressure? (e.g., visiting nurse, automatic home BP monitor)     Home BP kit 4. HISTORY: Do you have a history of low blood pressure? What is your blood pressure normally?     Yes 5. MEDICINES: Are you taking any medicines for blood pressure? If Yes, ask: Have they been changed recently?     Yes medicine was adjusted to take 2 pills I would like for him to see Dr. Norleen if possible.   6. PULSE RATE: Do you know what your pulse rate is?      No 7. OTHER SYMPTOMS: Have you been sick recently? Have you had a recent injury?     No symptoms 8. PREGNANCY: Is there any chance you are pregnant? When was your last menstrual period?     N/A  Protocols used: Blood Pressure - Low-A-AH Wife seeking advice on what husband should do regarding taking the losartan .  Since 2 pills dropped his BP to 99/70 this morning.  No symptoms  but concerned about it being so low.   Please call her at [phone], Lydia Meng.   Appt made with Dr. Norleen at her request for 05/30/2024 to check his BP in the office.       FYI Only or Action Required?: Action required by provider: clinical question for provider and update on patient condition.  Patient was last seen in primary care on 05/28/2024 by Purcell Emil Schanz, MD.  Called Nurse Triage reporting No chief complaint on file..  Symptoms began today.  Interventions attempted: Prescription medications: Took 2 losartan  pills as directed now BP is too low.  Symptoms are: stable. No symptoms  Triage Disposition: See Physician Within 24 Hours  Patient/caregiver understands and will follow disposition?: Yes High priority message sent to practice to call wife with further directions regarding dosing of losartan .

## 2024-05-30 ENCOUNTER — Ambulatory Visit: Admitting: Internal Medicine

## 2024-06-02 DIAGNOSIS — R350 Frequency of micturition: Secondary | ICD-10-CM | POA: Diagnosis not present

## 2024-06-05 ENCOUNTER — Ambulatory Visit (INDEPENDENT_AMBULATORY_CARE_PROVIDER_SITE_OTHER): Admitting: Internal Medicine

## 2024-06-05 ENCOUNTER — Ambulatory Visit: Payer: Medicare Other | Admitting: Internal Medicine

## 2024-06-05 ENCOUNTER — Encounter: Payer: Self-pay | Admitting: Internal Medicine

## 2024-06-05 VITALS — BP 146/78 | HR 56 | Temp 97.6°F | Ht 71.0 in | Wt 235.4 lb

## 2024-06-05 DIAGNOSIS — E559 Vitamin D deficiency, unspecified: Secondary | ICD-10-CM | POA: Diagnosis not present

## 2024-06-05 DIAGNOSIS — E7800 Pure hypercholesterolemia, unspecified: Secondary | ICD-10-CM

## 2024-06-05 DIAGNOSIS — R739 Hyperglycemia, unspecified: Secondary | ICD-10-CM | POA: Diagnosis not present

## 2024-06-05 DIAGNOSIS — E538 Deficiency of other specified B group vitamins: Secondary | ICD-10-CM | POA: Diagnosis not present

## 2024-06-05 DIAGNOSIS — I10 Essential (primary) hypertension: Secondary | ICD-10-CM

## 2024-06-05 MED ORDER — HYDROCHLOROTHIAZIDE 25 MG PO TABS: 25.0000 mg | ORAL_TABLET | Freq: Every day | ORAL | 3 refills | Status: DC

## 2024-06-05 MED ORDER — LOSARTAN POTASSIUM 50 MG PO TABS: 50.0000 mg | ORAL_TABLET | Freq: Every day | ORAL | 3 refills | Status: DC

## 2024-06-05 NOTE — Assessment & Plan Note (Signed)
 Lab Results  Component Value Date   LDLCALC 84 05/23/2024   uncontrolled, pt for lower chol diet, declines statin for now

## 2024-06-05 NOTE — Assessment & Plan Note (Signed)
 Last vitamin D  Lab Results  Component Value Date   VD25OH 27.42 (L) 05/23/2024   Low, to start oral replacement

## 2024-06-05 NOTE — Assessment & Plan Note (Signed)
 Lab Results  Component Value Date   HGBA1C 5.8 05/23/2024   Stable, pt to continue current medical treatment  - diet, wt control

## 2024-06-05 NOTE — Patient Instructions (Addendum)
 Ok to change the losartan  Hct 50 - 12.5 mg to the separate prescriptions of:  Losartan  50 mg per day, AND the increased HCT 25 mg per day  Please continue to monitor the BP as you do  Please continue all other medications as before, and refills have been done if requested.  Please have the pharmacy call with any other refills you may need.  Please keep your appointments with your specialists as you may have planned  Please make an Appointment to return in 3 months, or sooner if needed, and ok to cancel the Aug 6 appt

## 2024-06-05 NOTE — Progress Notes (Signed)
 Patient ID: [name], male   DOB: [birth year], 73 y.o.   MRN: [id number]        Chief Complaint: follow up HTN, low vit d, hyperglycemia, hld       HPI:  [name] is a 73 y.o. male here with c/o persistent elevated BP. Was seen per Dr Purcell 7/23 who recommended to double the current losartan  hct 50-12.5 mg  They did this but BP has been labile and has felt BP symptoms with lower  BP at home with sbp < 110 with dizziness, but no falls.  Pt denies chest pain, increased sob or doe, wheezing, orthopnea, PND, increased LE swelling, palpitations,   Pt denies polydipsia, polyuria, or new focal neuro s/s.        Wt Readings from Last 3 Encounters:  06/05/24 235 lb 6.4 oz (106.8 kg)  05/28/24 235 lb (106.6 kg)  05/23/24 235 lb 4 oz (106.7 kg)   BP Readings from Last 3 Encounters:  06/05/24 (!) 146/78  05/28/24 (!) 144/102  05/23/24 130/70         Past Medical History:  Diagnosis Date   Allergic rhinitis    Anemia, pernicious    Anxiety and depression    sees Dr. Vincente   Arthritis    B12 deficiency 01/23/2020   Catheter-associated urinary tract infection (HCC)    Diverticulosis of colon    Elevated PSA    and hypogonadism, sees urologist   GERD (gastroesophageal reflux disease)    hiatal hernia   Hx of blood transfusion reaction 1999   Hyperlipidemia    Hyperparathyroidism (HCC)    Hyperplastic colon polyp    Hypertension    acei causes cough   IBS (irritable bowel syndrome)    Nephrolithiasis    Obstructive chronic bronchitis without exacerbation, followed by Dr. Brien 03/04/2009   ONO RA  Normal 07/28/11 6 min walk >555m no desat PFTs 08/08/11: DLCO 70% TLC 80%  FeV1 80%   Fef 25 75 56% with significant improvement after BD    Peritonitis (HCC)    after surgery in 1999   Pneumonia    PONV (postoperative nausea and vomiting)    Rectal fissure    Trigeminal neuralgia    has facial asymetry   Ventral hernia    Past Surgical History:  Procedure  Laterality Date   ABDOMINAL SURGERY     Multiple   BRAIN SURGERY  01/2011   Trigeminal nerve/Dr [name]   CHOLECYSTECTOMY  1981   DENTAL SURGERY  2019   tooth extraction, root canal   ESOPHAGEAL MANOMETRY N/A 04/28/2020   Procedure: ESOPHAGEAL MANOMETRY (EM);  Surgeon: Jeffrey Gustav GAILS, MD;  Location: [facility];  Service: Endoscopy;  Laterality: N/A;   HAND SURGERY  1973   Left   HERNIA REPAIR  1999   KNEE SURGERY  1997   Left   LITHOTRIPSY     Right   NISSEN FUNDOPLICATION  1999    complicated by gastric perforation, peritonitis, ventral nernia    RHIZOTOMY Right 11/17/2014   Procedure: RHIZOTOMY TO RIGHT V2,V3;  Surgeon: Jeffrey Unice, MD;  Location: [facility];  Service: Neurosurgery;  Laterality: Right;  right   RHIZOTOMY Right 12/22/2014   Procedure: Right V2 and V3 trigeminal rhysolysis;  Surgeon: Jeffrey Unice, MD;  Location: [facility];  Service: Neurosurgery;  Laterality: Right;  Right V2 and V3 trigeminal rhysolysis   RHIZOTOMY W/ RADIOFREQUENCY ABLATION  08/2017   Dr [name]   UPPER GASTROINTESTINAL  ENDOSCOPY     VENTRAL HERNIA REPAIR  2004    reports that he quit smoking about 27 years ago. His smoking use included cigarettes. He started smoking about 47 years ago. He has a 20 pack-year smoking history. He quit smokeless tobacco use about 40 years ago.  His smokeless tobacco use included chew. He reports that he does not drink alcohol and does not use drugs. family history includes Colon polyps in his mother; Coronary artery disease in his mother; Diabetes in his brother, mother, and sister; Heart disease in his brother and brother; Hypertension in his brother. Allergies  Allergen Reactions   Dilantin  [Phenytoin ] Other (See Comments)    bradycardia   Dilaudid  [Hydromorphone  Hcl] Nausea And Vomiting   Sulfa Antibiotics Hives   Morphine     Ace Inhibitors Cough   Augmentin [Amoxicillin -Pot Clavulanate] Nausea And Vomiting   Codeine Nausea And Vomiting   Current  Outpatient Medications on File Prior to Visit  Medication Sig Dispense Refill   acetaminophen  (TYLENOL ) 325 MG tablet Take 2 tablets (650 mg total) by mouth every 6 (six) hours as needed for mild pain, moderate pain or fever.     ALPRAZolam  (XANAX ) 1 MG tablet TAKE 1 TABLET BY MOUTH FOUR TIMES DAILY AS NEEDED FOR ANXIETY 120 tablet 2   carbamazepine  (TEGRETOL  XR) 100 MG 12 hr tablet Take 100-200 mg by mouth See admin instructions. 100mg  in the morning and 200mg  at night.     cyanocobalamin  (VITAMIN B12) 1000 MCG/ML injection INJECT 1 ML every 7 days 60 mL 3   dexlansoprazole  (DEXILANT ) 60 MG capsule TAKE ONE CAPSULE BY MOUTH DAILY 30 capsule 5   esomeprazole  (NEXIUM ) 40 MG capsule Take 1 capsule (40 mg total) by mouth 2 (two) times daily before a meal. 180 capsule 3   famotidine  (PEPCID ) 20 MG tablet Take 1 tablet (20 mg total) by mouth at bedtime. 90 tablet 3   finasteride  (PROSCAR ) 5 MG tablet Take 1 tablet (5 mg total) by mouth daily. 90 tablet 3   gabapentin  (NEURONTIN ) 400 MG capsule Take 400-800 mg by mouth See admin instructions. Taking 400mg  at 0800, 1400, then 2 capsules (800mg ) at bedtime.     polyethylene glycol powder (GLYCOLAX /MIRALAX ) powder DISSOLVE 1 CAPFUL IN LIQUID EVERY DAY AS NEEDED FOR CONSTIPATION 527 g 2   Probiotic Product (ALIGN) 4 MG CAPS Take 4 mg by mouth daily.     sucralfate  (CARAFATE ) 1 g tablet TAKE 1 TABLET BY MOUTH FOUR TIMES DAILY WITH meals AND AT BEDTIME (DO NOT TAKE WITHIN 4 HOURS OF ANY OTHER MEDICATIONS) 60 tablet 1   tamsulosin  (FLOMAX ) 0.4 MG CAPS capsule Take 0.4 mg by mouth at bedtime.     No current facility-administered medications on file prior to visit.        ROS:  All others reviewed and negative.  Objective        PE:  BP (!) 146/78   Pulse (!) 56   Temp 97.6 F (36.4 C)   Ht 5' 11 (1.803 m)   Wt 235 lb 6.4 oz (106.8 kg)   SpO2 98%   BMI 32.83 kg/m                 Constitutional: Pt appears in NAD               HENT: Head: NCAT.                 Right Ear: External ear normal.  Left Ear: External ear normal.                Eyes: . Pupils are equal, round, and reactive to light. Conjunctivae and EOM are normal               Nose: without d/c or deformity               Neck: Neck supple. Gross normal ROM               Cardiovascular: Normal rate and regular rhythm.                 Pulmonary/Chest: Effort normal and breath sounds without rales or wheezing.                Abd:  Soft, NT, ND, + BS, no organomegaly               Neurological: Pt is alert. At baseline orientation, motor grossly intact               Skin: Skin is warm. No rashes, no other new lesions, LE edema - none               Psychiatric: Pt behavior is normal without agitation   Micro: none  Cardiac tracings I have personally interpreted today:  none  Pertinent Radiological findings (summarize): none   Lab Results  Component Value Date   WBC 6.3 05/23/2024   HGB 15.5 05/23/2024   HCT 46.0 05/23/2024   PLT 151.0 05/23/2024   GLUCOSE 92 05/23/2024   CHOL 170 05/23/2024   TRIG 140.0 05/23/2024   HDL 57.40 05/23/2024   LDLCALC 84 05/23/2024   ALT 9 05/23/2024   AST 11 05/23/2024   NA 133 (L) 05/23/2024   K 4.5 05/23/2024   CL 95 (L) 05/23/2024   CREATININE 0.78 05/23/2024   BUN 11 05/23/2024   CO2 31 05/23/2024   TSH 1.69 05/23/2024   PSA 2.64 05/23/2024   HGBA1C 5.8 05/23/2024   Assessment/Plan:  [name] is a 73 y.o. White or Caucasian [1] male with  has a past medical history of Allergic rhinitis, Anemia, pernicious, Anxiety and depression, Arthritis, B12 deficiency (01/23/2020), Catheter-associated urinary tract infection (HCC), Diverticulosis of colon, Elevated PSA, GERD (gastroesophageal reflux disease), blood transfusion reaction (1999), Hyperlipidemia, Hyperparathyroidism (HCC), Hyperplastic colon polyp, Hypertension, IBS (irritable bowel syndrome), Nephrolithiasis, Obstructive chronic bronchitis without  exacerbation, followed by Dr. Brien (03/04/2009), Peritonitis (HCC), Pneumonia, PONV (postoperative nausea and vomiting), Rectal fissure, Trigeminal neuralgia, and Ventral hernia.  Vitamin D  deficiency Last vitamin D  Lab Results  Component Value Date   VD25OH 27.42 (L) 05/23/2024   Low, to start oral replacement   Hyperglycemia Lab Results  Component Value Date   HGBA1C 5.8 05/23/2024   Stable, pt to continue current medical treatment  - diet, wt control   HLD (hyperlipidemia) Lab Results  Component Value Date   LDLCALC 84 05/23/2024   uncontrolled, pt for lower chol diet, declines statin for now   Essential hypertension BP Readings from Last 3 Encounters:  06/05/24 (!) 146/78  05/28/24 (!) 144/102  05/23/24 130/70   Overcontrolled at home on increased hyzaar to 2 qd, uncontrolled today when went back to last med regimen (1 every day)  pt to change the hyzaar to separate Losartan  50 mg every day, and increased HCT to 25 mg every day.  Hopefully this gets the middle ground of lower BP without too low   B12  deficiency Lab Results  Component Value Date   VITAMINB12 846 12/07/2023   Stable, cont oral replacement - b12 1000 mcg qd  Followup: Return in about 3 months (around 09/05/2024).  Lynwood Rush, MD 06/05/2024 12:44 PM  [facility]

## 2024-06-05 NOTE — Assessment & Plan Note (Signed)
 Lab Results  Component Value Date   VITAMINB12 846 12/07/2023   Stable, cont oral replacement - b12 1000 mcg qd

## 2024-06-05 NOTE — Assessment & Plan Note (Signed)
 BP Readings from Last 3 Encounters:  06/05/24 (!) 146/78  05/28/24 (!) 144/102  05/23/24 130/70   Overcontrolled at home on increased hyzaar to 2 qd, uncontrolled today when went back to last med regimen (1 every day)  pt to change the hyzaar to separate Losartan  50 mg every day, and increased HCT to 25 mg every day.  Hopefully this gets the middle ground of lower BP without too low

## 2024-06-11 ENCOUNTER — Encounter: Payer: Self-pay | Admitting: Internal Medicine

## 2024-06-11 ENCOUNTER — Ambulatory Visit: Admitting: Internal Medicine

## 2024-06-11 VITALS — BP 108/70 | HR 65 | Temp 97.7°F | Ht 71.0 in | Wt 234.0 lb

## 2024-06-11 DIAGNOSIS — E559 Vitamin D deficiency, unspecified: Secondary | ICD-10-CM | POA: Diagnosis not present

## 2024-06-11 DIAGNOSIS — I10 Essential (primary) hypertension: Secondary | ICD-10-CM

## 2024-06-11 DIAGNOSIS — R739 Hyperglycemia, unspecified: Secondary | ICD-10-CM

## 2024-06-11 DIAGNOSIS — E7800 Pure hypercholesterolemia, unspecified: Secondary | ICD-10-CM | POA: Diagnosis not present

## 2024-06-11 MED ORDER — HYDROCHLOROTHIAZIDE 12.5 MG PO CAPS: 12.5000 mg | ORAL_CAPSULE | Freq: Every day | ORAL | 3 refills | Status: DC

## 2024-06-11 MED ORDER — LOSARTAN POTASSIUM 100 MG PO TABS: 100.0000 mg | ORAL_TABLET | Freq: Every day | ORAL | 3 refills | Status: DC

## 2024-06-11 NOTE — Assessment & Plan Note (Signed)
 Lab Results  Component Value Date   HGBA1C 5.8 05/23/2024   Stable, pt to continue current medical treatment  - diet, wt control

## 2024-06-11 NOTE — Assessment & Plan Note (Signed)
 Last vitamin D  Lab Results  Component Value Date   VD25OH 27.42 (L) 05/23/2024   Low, to start oral replacement

## 2024-06-11 NOTE — Progress Notes (Signed)
 Patient ID: [name], male   DOB: [birth year], 73 y.o.   MRN: [id number]        Chief Complaint: follow up HTN, HLD and hyperglycemia , low vit d       HPI:  [name] is a 73 y.o. male here with wife, with 2 wks at least twice per day BP's at home at rest, usually am and pm.  They have new machine, but unfortunately most have SBP 140-170 over 90s, but also a few with lows and feeling fatigued and dizzy.  Pt denies chest pain, increased sob or doe, wheezing, orthopnea, PND, increased LE swelling, palpitations, or syncope.   Pt denies polydipsia, polyuria, or new focal neuro s/s.    Pt denies fever, wt loss, night sweats, loss of appetite, or other constitutional symptoms   Good med compliance with losartan  50 mg every day, hct 25 mg qd       Wt Readings from Last 3 Encounters:  06/11/24 234 lb (106.1 kg)  06/05/24 235 lb 6.4 oz (106.8 kg)  05/28/24 235 lb (106.6 kg)   BP Readings from Last 3 Encounters:  06/11/24 108/70  06/05/24 (!) 146/78  05/28/24 (!) 144/102         Past Medical History:  Diagnosis Date   Allergic rhinitis    Anemia, pernicious    Anxiety and depression    sees Dr. Vincente   Arthritis    B12 deficiency 01/23/2020   Catheter-associated urinary tract infection (HCC)    Diverticulosis of colon    Elevated PSA    and hypogonadism, sees urologist   GERD (gastroesophageal reflux disease)    hiatal hernia   Hx of blood transfusion reaction 1999   Hyperlipidemia    Hyperparathyroidism (HCC)    Hyperplastic colon polyp    Hypertension    acei causes cough   IBS (irritable bowel syndrome)    Nephrolithiasis    Obstructive chronic bronchitis without exacerbation, followed by Dr. Brien 03/04/2009   ONO RA  Normal 07/28/11 6 min walk >510m no desat PFTs 08/08/11: DLCO 70% TLC 80%  FeV1 80%   Fef 25 75 56% with significant improvement after BD    Peritonitis (HCC)    after surgery in 1999   Pneumonia    PONV (postoperative nausea and  vomiting)    Rectal fissure    Trigeminal neuralgia    has facial asymetry   Ventral hernia    Past Surgical History:  Procedure Laterality Date   ABDOMINAL SURGERY     Multiple   BRAIN SURGERY  01/2011   Trigeminal nerve/Dr Unice   CHOLECYSTECTOMY  1981   DENTAL SURGERY  2019   tooth extraction, root canal   ESOPHAGEAL MANOMETRY N/A 04/28/2020   Procedure: ESOPHAGEAL MANOMETRY (EM);  Surgeon: Shila Gustav GAILS, MD;  Location: [facility];  Service: Endoscopy;  Laterality: N/A;   HAND SURGERY  1973   Left   HERNIA REPAIR  1999   KNEE SURGERY  1997   Left   LITHOTRIPSY     Right   NISSEN FUNDOPLICATION  1999    complicated by gastric perforation, peritonitis, ventral nernia    RHIZOTOMY Right 11/17/2014   Procedure: RHIZOTOMY TO RIGHT V2,V3;  Surgeon: Fairy Unice, MD;  Location: [facility];  Service: Neurosurgery;  Laterality: Right;  right   RHIZOTOMY Right 12/22/2014   Procedure: Right V2 and V3 trigeminal rhysolysis;  Surgeon: Fairy Unice, MD;  Location: [facility];  Service: Neurosurgery;  Laterality: Right;  Right V2 and V3 trigeminal rhysolysis   RHIZOTOMY W/ RADIOFREQUENCY ABLATION  08/2017   Dr Mindi   UPPER GASTROINTESTINAL ENDOSCOPY     VENTRAL HERNIA REPAIR  2004    reports that he quit smoking about 27 years ago. His smoking use included cigarettes. He started smoking about 47 years ago. He has a 20 pack-year smoking history. He quit smokeless tobacco use about 40 years ago.  His smokeless tobacco use included chew. He reports that he does not drink alcohol and does not use drugs. family history includes Colon polyps in his mother; Coronary artery disease in his mother; Diabetes in his brother, mother, and sister; Heart disease in his brother and brother; Hypertension in his brother. Allergies  Allergen Reactions   Dilantin  [Phenytoin ] Other (See Comments)    bradycardia   Dilaudid  [Hydromorphone  Hcl] Nausea And Vomiting   Sulfa Antibiotics Hives    Morphine     Ace Inhibitors Cough   Augmentin [Amoxicillin -Pot Clavulanate] Nausea And Vomiting   Codeine Nausea And Vomiting   Current Outpatient Medications on File Prior to Visit  Medication Sig Dispense Refill   acetaminophen  (TYLENOL ) 325 MG tablet Take 2 tablets (650 mg total) by mouth every 6 (six) hours as needed for mild pain, moderate pain or fever.     ALPRAZolam  (XANAX ) 1 MG tablet TAKE 1 TABLET BY MOUTH FOUR TIMES DAILY AS NEEDED FOR ANXIETY 120 tablet 2   carbamazepine  (TEGRETOL  XR) 100 MG 12 hr tablet Take 100-200 mg by mouth See admin instructions. 100mg  in the morning and 200mg  at night.     cyanocobalamin  (VITAMIN B12) 1000 MCG/ML injection INJECT 1 ML every 7 days 60 mL 3   dexlansoprazole  (DEXILANT ) 60 MG capsule TAKE ONE CAPSULE BY MOUTH DAILY 30 capsule 5   esomeprazole  (NEXIUM ) 40 MG capsule Take 1 capsule (40 mg total) by mouth 2 (two) times daily before a meal. 180 capsule 3   famotidine  (PEPCID ) 20 MG tablet Take 1 tablet (20 mg total) by mouth at bedtime. 90 tablet 3   finasteride  (PROSCAR ) 5 MG tablet Take 1 tablet (5 mg total) by mouth daily. 90 tablet 3   gabapentin  (NEURONTIN ) 400 MG capsule Take 400-800 mg by mouth See admin instructions. Taking 400mg  at 0800, 1400, then 2 capsules (800mg ) at bedtime.     polyethylene glycol powder (GLYCOLAX /MIRALAX ) powder DISSOLVE 1 CAPFUL IN LIQUID EVERY DAY AS NEEDED FOR CONSTIPATION 527 g 2   Probiotic Product (ALIGN) 4 MG CAPS Take 4 mg by mouth daily.     sucralfate  (CARAFATE ) 1 g tablet TAKE 1 TABLET BY MOUTH FOUR TIMES DAILY WITH meals AND AT BEDTIME (DO NOT TAKE WITHIN 4 HOURS OF ANY OTHER MEDICATIONS) 60 tablet 1   tamsulosin  (FLOMAX ) 0.4 MG CAPS capsule Take 0.4 mg by mouth at bedtime.     No current facility-administered medications on file prior to visit.        ROS:  All others reviewed and negative.  Objective        PE:  BP 108/70   Pulse 65   Temp 97.7 F (36.5 C)   Ht 5' 11 (1.803 m)   Wt 234 lb  (106.1 kg)   SpO2 99%   BMI 32.64 kg/m                 Constitutional: Pt appears in NAD               HENT: Head: NCAT.  Right Ear: External ear normal.                 Left Ear: External ear normal.                Eyes: . Pupils are equal, round, and reactive to light. Conjunctivae and EOM are normal               Nose: without d/c or deformity               Neck: Neck supple. Gross normal ROM               Cardiovascular: Normal rate and regular rhythm.                 Pulmonary/Chest: Effort normal and breath sounds without rales or wheezing.                Abd:  Soft, NT, ND, + BS, no organomegaly               Neurological: Pt is alert. At baseline orientation, motor grossly intact               Skin: Skin is warm. No rashes, no other new lesions, LE edema - none               Psychiatric: Pt behavior is normal without agitation   Micro: none  Cardiac tracings I have personally interpreted today:  none  Pertinent Radiological findings (summarize): none   Lab Results  Component Value Date   WBC 6.3 05/23/2024   HGB 15.5 05/23/2024   HCT 46.0 05/23/2024   PLT 151.0 05/23/2024   GLUCOSE 92 05/23/2024   CHOL 170 05/23/2024   TRIG 140.0 05/23/2024   HDL 57.40 05/23/2024   LDLCALC 84 05/23/2024   ALT 9 05/23/2024   AST 11 05/23/2024   NA 133 (L) 05/23/2024   K 4.5 05/23/2024   CL 95 (L) 05/23/2024   CREATININE 0.78 05/23/2024   BUN 11 05/23/2024   CO2 31 05/23/2024   TSH 1.69 05/23/2024   PSA 2.64 05/23/2024   HGBA1C 5.8 05/23/2024   Assessment/Plan:  Haywood Meinders is a 73 y.o. White or Caucasian [1] male with  has a past medical history of Allergic rhinitis, Anemia, pernicious, Anxiety and depression, Arthritis, B12 deficiency (01/23/2020), Catheter-associated urinary tract infection (HCC), Diverticulosis of colon, Elevated PSA, GERD (gastroesophageal reflux disease), blood transfusion reaction (1999), Hyperlipidemia, Hyperparathyroidism  (HCC), Hyperplastic colon polyp, Hypertension, IBS (irritable bowel syndrome), Nephrolithiasis, Obstructive chronic bronchitis without exacerbation, followed by Dr. Brien (03/04/2009), Peritonitis (HCC), Pneumonia, PONV (postoperative nausea and vomiting), Rectal fissure, Trigeminal neuralgia, and Ventral hernia.  Vitamin D  deficiency Last vitamin D  Lab Results  Component Value Date   VD25OH 27.42 (L) 05/23/2024   Low, to start oral replacement   Hyperglycemia Lab Results  Component Value Date   HGBA1C 5.8 05/23/2024   Stable, pt to continue current medical treatment  - diet, wt control   HLD (hyperlipidemia) Lab Results  Component Value Date   LDLCALC 84 05/23/2024   uncontrolled, pt to continue lower chol diet, declines statin for now  Essential hypertension BP Readings from Last 3 Encounters:  06/11/24 108/70  06/05/24 (!) 146/78  05/28/24 (!) 144/102   Documented low now but has numerous elevated Bps at home, pt to increase the losartan  100 mg every day, but decrease hct 12.5 mg every day due to some lability and risk of lower BP  Followup:  Return in about 6 months (around 12/12/2024).  Lynwood Rush, MD 06/11/2024 12:56 PM [facility] - [facility]

## 2024-06-11 NOTE — Assessment & Plan Note (Signed)
 Lab Results  Component Value Date   LDLCALC 84 05/23/2024   uncontrolled, pt to continue lower chol diet, declines statin for now

## 2024-06-11 NOTE — Patient Instructions (Signed)
 Ok to increase the losartan  to 100 mg per day for the high Bps  Ok to decrease the HCT to 12.5 mg per day to avoid any low BP's  Please continue all other medications as before, and refills have been done if requested.  Please have the pharmacy call with any other refills you may need.  Please continue your efforts at being more active, low cholesterol diet, and weight control.  Please keep your appointments with your specialists as you may have planned

## 2024-06-11 NOTE — Assessment & Plan Note (Signed)
 BP Readings from Last 3 Encounters:  06/11/24 108/70  06/05/24 (!) 146/78  05/28/24 (!) 144/102   Documented low now but has numerous elevated Bps at home, pt to increase the losartan  100 mg every day, but decrease hct 12.5 mg every day due to some lability and risk of lower BP

## 2024-06-19 ENCOUNTER — Ambulatory Visit (INDEPENDENT_AMBULATORY_CARE_PROVIDER_SITE_OTHER): Admitting: Internal Medicine

## 2024-06-19 ENCOUNTER — Encounter: Payer: Self-pay | Admitting: Internal Medicine

## 2024-06-19 VITALS — BP 158/100 | HR 105 | Temp 97.7°F | Ht 71.0 in | Wt 237.0 lb

## 2024-06-19 DIAGNOSIS — E7800 Pure hypercholesterolemia, unspecified: Secondary | ICD-10-CM | POA: Diagnosis not present

## 2024-06-19 DIAGNOSIS — I10 Essential (primary) hypertension: Secondary | ICD-10-CM | POA: Diagnosis not present

## 2024-06-19 DIAGNOSIS — R739 Hyperglycemia, unspecified: Secondary | ICD-10-CM

## 2024-06-19 DIAGNOSIS — E559 Vitamin D deficiency, unspecified: Secondary | ICD-10-CM

## 2024-06-19 MED ORDER — AMLODIPINE BESYLATE 2.5 MG PO TABS: 2.5000 mg | ORAL_TABLET | Freq: Every day | ORAL | 3 refills | Status: AC

## 2024-06-19 MED ORDER — LOSARTAN POTASSIUM 50 MG PO TABS: 50.0000 mg | ORAL_TABLET | Freq: Every day | ORAL | 3 refills | Status: DC

## 2024-06-19 NOTE — Progress Notes (Signed)
 Patient ID: [name], male   DOB: [birth year], 73 y.o.   MRN: [id number]        Chief Complaint: follow up HTN, HLD and hyperglycemia m low vit d       HPI:  [name] is a 73 y.o. male here with c/o somewhat labile HTN with sensitivity it seems to meds, tends to still bottom out < 90 with losartan  100 mg, Pt denies chest pain, increased sob or doe, wheezing, orthopnea, PND, increased LE swelling, palpitations, dizziness or syncope.   Pt denies polydipsia, polyuria, or new focal neuro s/s.    Pt denies fever, wt loss, night sweats, loss of appetite, or other constitutional symptoms         Wt Readings from Last 3 Encounters:  06/19/24 237 lb (107.5 kg)  06/11/24 234 lb (106.1 kg)  06/05/24 235 lb 6.4 oz (106.8 kg)   BP Readings from Last 3 Encounters:  06/19/24 (!) 158/100  06/11/24 108/70  06/05/24 (!) 146/78         Past Medical History:  Diagnosis Date   Allergic rhinitis    Anemia, pernicious    Anxiety and depression    sees Dr. Vincente   Arthritis    B12 deficiency 01/23/2020   Catheter-associated urinary tract infection (HCC)    Diverticulosis of colon    Elevated PSA    and hypogonadism, sees urologist   GERD (gastroesophageal reflux disease)    hiatal hernia   Hx of blood transfusion reaction 1999   Hyperlipidemia    Hyperparathyroidism (HCC)    Hyperplastic colon polyp    Hypertension    acei causes cough   IBS (irritable bowel syndrome)    Nephrolithiasis    Obstructive chronic bronchitis without exacerbation, followed by Dr. Brien 03/04/2009   ONO RA  Normal 07/28/11 6 min walk >51m no desat PFTs 08/08/11: DLCO 70% TLC 80%  FeV1 80%   Fef 25 75 56% with significant improvement after BD    Peritonitis (HCC)    after surgery in 1999   Pneumonia    PONV (postoperative nausea and vomiting)    Rectal fissure    Trigeminal neuralgia    has facial asymetry   Ventral hernia    Past Surgical History:  Procedure Laterality Date    ABDOMINAL SURGERY     Multiple   BRAIN SURGERY  01/2011   Trigeminal nerve/Dr Morgan   CHOLECYSTECTOMY  1981   DENTAL SURGERY  2019   tooth extraction, root canal   ESOPHAGEAL MANOMETRY N/A 04/28/2020   Procedure: ESOPHAGEAL MANOMETRY (EM);  Surgeon: Jeffrey [name] GAILS, MD;  Location: [facility];  Service: Endoscopy;  Laterality: N/A;   HAND SURGERY  1973   Left   HERNIA REPAIR  1999   KNEE SURGERY  1997   Left   LITHOTRIPSY     Right   NISSEN FUNDOPLICATION  1999    complicated by gastric perforation, peritonitis, ventral nernia    RHIZOTOMY Right 11/17/2014   Procedure: RHIZOTOMY TO RIGHT V2,V3;  Surgeon: Jeffrey Unice, MD;  Location: [facility];  Service: Neurosurgery;  Laterality: Right;  right   RHIZOTOMY Right 12/22/2014   Procedure: Right V2 and V3 trigeminal rhysolysis;  Surgeon: Jeffrey Unice, MD;  Location: [facility];  Service: Neurosurgery;  Laterality: Right;  Right V2 and V3 trigeminal rhysolysis   RHIZOTOMY W/ RADIOFREQUENCY ABLATION  08/2017   Dr [name]   UPPER GASTROINTESTINAL ENDOSCOPY     VENTRAL HERNIA REPAIR  2004    reports that he quit smoking about 27 years ago. His smoking use included cigarettes. He started smoking about 47 years ago. He has a 20 pack-year smoking history. He quit smokeless tobacco use about 40 years ago.  His smokeless tobacco use included chew. He reports that he does not drink alcohol and does not use drugs. family history includes Colon polyps in his mother; Coronary artery disease in his mother; Diabetes in his brother, mother, and sister; Heart disease in his brother and brother; Hypertension in his brother. Allergies  Allergen Reactions   Dilantin  [Phenytoin ] Other (See Comments)    bradycardia   Dilaudid  [Hydromorphone  Hcl] Nausea And Vomiting   Sulfa Antibiotics Hives   Morphine     Ace Inhibitors Cough   Augmentin [Amoxicillin -Pot Clavulanate] Nausea And Vomiting   Codeine Nausea And Vomiting   Current Outpatient  Medications on File Prior to Visit  Medication Sig Dispense Refill   acetaminophen  (TYLENOL ) 325 MG tablet Take 2 tablets (650 mg total) by mouth every 6 (six) hours as needed for mild pain, moderate pain or fever.     ALPRAZolam  (XANAX ) 1 MG tablet TAKE 1 TABLET BY MOUTH FOUR TIMES DAILY AS NEEDED FOR ANXIETY 120 tablet 2   carbamazepine  (TEGRETOL  XR) 100 MG 12 hr tablet Take 100-200 mg by mouth See admin instructions. 100mg  in the morning and 200mg  at night.     cyanocobalamin  (VITAMIN B12) 1000 MCG/ML injection INJECT 1 ML every 7 days 60 mL 3   dexlansoprazole  (DEXILANT ) 60 MG capsule TAKE ONE CAPSULE BY MOUTH DAILY 30 capsule 5   esomeprazole  (NEXIUM ) 40 MG capsule Take 1 capsule (40 mg total) by mouth 2 (two) times daily before a meal. 180 capsule 3   famotidine  (PEPCID ) 20 MG tablet Take 1 tablet (20 mg total) by mouth at bedtime. 90 tablet 3   finasteride  (PROSCAR ) 5 MG tablet Take 1 tablet (5 mg total) by mouth daily. 90 tablet 3   gabapentin  (NEURONTIN ) 400 MG capsule Take 400-800 mg by mouth See admin instructions. Taking 400mg  at 0800, 1400, then 2 capsules (800mg ) at bedtime.     hydrochlorothiazide  (MICROZIDE ) 12.5 MG capsule Take 1 capsule (12.5 mg total) by mouth daily. 90 capsule 3   polyethylene glycol powder (GLYCOLAX /MIRALAX ) powder DISSOLVE 1 CAPFUL IN LIQUID EVERY DAY AS NEEDED FOR CONSTIPATION 527 g 2   Probiotic Product (ALIGN) 4 MG CAPS Take 4 mg by mouth daily.     sucralfate  (CARAFATE ) 1 g tablet TAKE 1 TABLET BY MOUTH FOUR TIMES DAILY WITH meals AND AT BEDTIME (DO NOT TAKE WITHIN 4 HOURS OF ANY OTHER MEDICATIONS) 60 tablet 1   tamsulosin  (FLOMAX ) 0.4 MG CAPS capsule Take 0.4 mg by mouth at bedtime.     No current facility-administered medications on file prior to visit.        ROS:  All others reviewed and negative.  Objective        PE:  BP (!) 158/100 (BP Location: Left Arm, Patient Position: Sitting, Cuff Size: Normal)   Pulse (!) 105   Temp 97.7 F (36.5 C)  (Oral)   Ht 5' 11 (1.803 m)   Wt 237 lb (107.5 kg)   SpO2 93%   BMI 33.05 kg/m                 Constitutional: Pt appears in NAD               HENT: Head: NCAT.  Right Ear: External ear normal.                 Left Ear: External ear normal.                Eyes: . Pupils are equal, round, and reactive to light. Conjunctivae and EOM are normal               Nose: without d/c or deformity               Neck: Neck supple. Gross normal ROM               Cardiovascular: Normal rate and regular rhythm.                 Pulmonary/Chest: Effort normal and breath sounds without rales or wheezing.                Abd:  Soft, NT, ND, + BS, no organomegaly               Neurological: Pt is alert. At baseline orientation, motor grossly intact               Skin: Skin is warm. No rashes, no other new lesions, LE edema - none               Psychiatric: Pt behavior is normal without agitation   Micro: none  Cardiac tracings I have personally interpreted today:  none  Pertinent Radiological findings (summarize): none   Lab Results  Component Value Date   WBC 6.3 05/23/2024   HGB 15.5 05/23/2024   HCT 46.0 05/23/2024   PLT 151.0 05/23/2024   GLUCOSE 92 05/23/2024   CHOL 170 05/23/2024   TRIG 140.0 05/23/2024   HDL 57.40 05/23/2024   LDLCALC 84 05/23/2024   ALT 9 05/23/2024   AST 11 05/23/2024   NA 133 (L) 05/23/2024   K 4.5 05/23/2024   CL 95 (L) 05/23/2024   CREATININE 0.78 05/23/2024   BUN 11 05/23/2024   CO2 31 05/23/2024   TSH 1.69 05/23/2024   PSA 2.64 05/23/2024   HGBA1C 5.8 05/23/2024   Assessment/Plan:  [name] is a 73 y.o. White or Caucasian [1] male with  has a past medical history of Allergic rhinitis, Anemia, pernicious, Anxiety and depression, Arthritis, B12 deficiency (01/23/2020), Catheter-associated urinary tract infection (HCC), Diverticulosis of colon, Elevated PSA, GERD (gastroesophageal reflux disease), blood transfusion reaction  (1999), Hyperlipidemia, Hyperparathyroidism (HCC), Hyperplastic colon polyp, Hypertension, IBS (irritable bowel syndrome), Nephrolithiasis, Obstructive chronic bronchitis without exacerbation, followed by Dr. Brien (03/04/2009), Peritonitis (HCC), Pneumonia, PONV (postoperative nausea and vomiting), Rectal fissure, Trigeminal neuralgia, and Ventral hernia.  Vitamin D  deficiency Last vitamin D  Lab Results  Component Value Date   VD25OH 27.42 (L) 05/23/2024   Low, to start oral replacement   Hyperglycemia . Lab Results  Component Value Date   HGBA1C 5.8 05/23/2024   Stable, pt to continue current medical treatment - diet, wt control   HLD (hyperlipidemia) Lab Results  Component Value Date   LDLCALC 84 05/23/2024   Uncontrolled,, for lower chol diet, declines statin   Essential hypertension Somewhat labile but sensitive to low dose medications with bottoming out on current regimen - pt for reduced losartan  to 50 mg every day, cont hct 12.5 mg and try to split the difference with amlodipine  2.5 mg daily  Followup: Return in about 3 weeks (around 07/10/2024).  Jeffrey Rush, MD 06/21/2024 3:16 PM [facility]  [facility] - [facility]

## 2024-06-19 NOTE — Patient Instructions (Signed)
 Ok to decrease the losartan  to 50 mg per day  Please take all new medication as prescribed - the amlodipine  2.5 mg per day  Please continue all other medications as before, including the HCT 12.5 mg  Please have the pharmacy call with any other refills you may need.  Please continue your efforts at being more active, low cholesterol diet, and weight control.  Please keep your appointments with your specialists as you may have planned  Please make an Appointment to return in 3 weeks, or sooner if needed

## 2024-06-21 ENCOUNTER — Encounter: Payer: Self-pay | Admitting: Internal Medicine

## 2024-06-21 NOTE — Assessment & Plan Note (Signed)
 Lab Results  Component Value Date   LDLCALC 84 05/23/2024   Uncontrolled,, for lower chol diet, declines statin

## 2024-06-21 NOTE — Assessment & Plan Note (Signed)
 Last vitamin D  Lab Results  Component Value Date   VD25OH 27.42 (L) 05/23/2024   Low, to start oral replacement

## 2024-06-21 NOTE — Assessment & Plan Note (Signed)
.   Lab Results  Component Value Date   HGBA1C 5.8 05/23/2024   Stable, pt to continue current medical treatment - diet, wt control

## 2024-06-21 NOTE — Assessment & Plan Note (Signed)
 Somewhat labile but sensitive to low dose medications with bottoming out on current regimen - pt for reduced losartan  to 50 mg every day, cont hct 12.5 mg and try to split the difference with amlodipine  2.5 mg daily

## 2024-06-24 DIAGNOSIS — L578 Other skin changes due to chronic exposure to nonionizing radiation: Secondary | ICD-10-CM | POA: Diagnosis not present

## 2024-06-24 DIAGNOSIS — D1801 Hemangioma of skin and subcutaneous tissue: Secondary | ICD-10-CM | POA: Diagnosis not present

## 2024-06-24 DIAGNOSIS — L814 Other melanin hyperpigmentation: Secondary | ICD-10-CM | POA: Diagnosis not present

## 2024-06-24 DIAGNOSIS — D485 Neoplasm of uncertain behavior of skin: Secondary | ICD-10-CM | POA: Diagnosis not present

## 2024-06-24 DIAGNOSIS — L570 Actinic keratosis: Secondary | ICD-10-CM | POA: Diagnosis not present

## 2024-06-24 DIAGNOSIS — L821 Other seborrheic keratosis: Secondary | ICD-10-CM | POA: Diagnosis not present

## 2024-06-24 DIAGNOSIS — C44629 Squamous cell carcinoma of skin of left upper limb, including shoulder: Secondary | ICD-10-CM | POA: Diagnosis not present

## 2024-06-28 ENCOUNTER — Other Ambulatory Visit: Payer: Self-pay | Admitting: Nurse Practitioner

## 2024-06-30 NOTE — Telephone Encounter (Signed)
 Called and spoke to patient.  He is still taking Carafate  and would like a script to get him to Oct when he is seeing Camie Furbish

## 2024-07-08 ENCOUNTER — Ambulatory Visit (INDEPENDENT_AMBULATORY_CARE_PROVIDER_SITE_OTHER): Payer: Medicare Other

## 2024-07-08 VITALS — Ht 71.0 in | Wt 237.0 lb

## 2024-07-08 DIAGNOSIS — Z0000 Encounter for general adult medical examination without abnormal findings: Secondary | ICD-10-CM | POA: Diagnosis not present

## 2024-07-08 NOTE — Patient Instructions (Signed)
 [name] , Thank you for taking time out of your busy schedule to complete your Annual Wellness Visit with me. I enjoyed our conversation and look forward to speaking with you again next year. I, as well as your care team,  appreciate your ongoing commitment to your health goals. Please review the following plan we discussed and let me know if I can assist you in the future. Your Game plan/ To Do List    Follow up Visits: We will see or speak with you next year for your Next Medicare AWV with our clinical staff Have you seen your provider in the last 6 months (3 months if uncontrolled diabetes)? Yes.  Last office visit on 06/19/2024.  Clinician Recommendations:  Aim for 30 minutes of exercise or brisk walking, 6-8 glasses of water, and 5 servings of fruits and vegetables each day. Keep up the good work.      This is a list of the screenings recommended for you:  Health Maintenance  Topic Date Due   Flu Shot  06/06/2024   Colon Cancer Screening  08/24/2024   Medicare Annual Wellness Visit  07/08/2025   DTaP/Tdap/Td vaccine (3 - Td or Tdap) 01/19/2027   Pneumococcal Vaccine for age over 51  Completed   Hepatitis C Screening  Completed   Zoster (Shingles) Vaccine  Completed   HPV Vaccine  Aged Out   Meningitis B Vaccine  Aged Out   COVID-19 Vaccine  Discontinued    Advanced directives: (Declined) Advance directive discussed with you today. Even though you declined this today, please call our office should you change your mind, and we can give you the proper paperwork for you to fill out. Advance Care Planning is important because it:  [x]  Makes sure you receive the medical care that is consistent with your values, goals, and preferences  [x]  It provides guidance to your family and loved ones and reduces their decisional burden about whether or not they are making the right decisions based on your wishes.  Follow the link provided in your after visit summary or read over the  paperwork we have mailed to you to help you started getting your Advance Directives in place. If you need assistance in completing these, please reach out to us  so that we can help you!  See attachments for Preventive Care and Fall Prevention Tips.

## 2024-07-08 NOTE — Progress Notes (Signed)
 Subjective:   [name] is a 73 y.o. who presents for a Medicare Wellness preventive visit.  As a reminder, Annual Wellness Visits don't include a physical exam, and some assessments may be limited, especially if this visit is performed virtually. We may recommend an in-person follow-up visit with your provider if needed.  Visit Complete: Virtual I connected with  [name] on 07/08/24 by a audio enabled telemedicine application and verified that I am speaking with the correct person using two identifiers.  Patient Location: Home  Provider Location: Home Office  I discussed the limitations of evaluation and management by telemedicine. The patient expressed understanding and agreed to proceed.  Vital Signs: Because this visit was a virtual/telehealth visit, some criteria may be missing or patient reported. Any vitals not documented were not able to be obtained and vitals that have been documented are patient reported.  VideoDeclined- This patient declined Librarian, academic. Therefore the visit was completed with audio only.  Persons Participating in Visit: Patient.  AWV Questionnaire: No: Patient Medicare AWV questionnaire was not completed prior to this visit.  Cardiac Risk Factors include: advanced age (>67men, >32 women);hypertension;Other (see comment);dyslipidemia, Risk factor comments: Aortic atherosclerosis, BPH     Objective:    Today's Vitals   07/08/24 0839  Weight: 237 lb (107.5 kg)  Height: 5' 11 (1.803 m)   Body mass index is 33.05 kg/m.     07/08/2024    8:52 AM 07/05/2023    8:21 AM 07/05/2022    1:45 PM 12/24/2020    3:56 PM 12/07/2017    1:50 PM 07/27/2017   10:11 PM 07/21/2017   10:35 AM  Advanced Directives  Does Patient Have a Medical Advance Directive? No Yes No No No  No  No   Type of Energy manager of Healthcare Power of Attorney in Chart?  No - copy  requested       Would patient like information on creating a medical advance directive?   No - Patient declined No - Patient declined        Data saved with a previous flowsheet row definition    Current Medications (verified) Outpatient Encounter Medications as of 07/08/2024  Medication Sig   acetaminophen  (TYLENOL ) 325 MG tablet Take 2 tablets (650 mg total) by mouth every 6 (six) hours as needed for mild pain, moderate pain or fever.   ALPRAZolam  (XANAX ) 1 MG tablet TAKE 1 TABLET BY MOUTH FOUR TIMES DAILY AS NEEDED FOR ANXIETY   amLODipine  (NORVASC ) 2.5 MG tablet Take 1 tablet (2.5 mg total) by mouth daily.   carbamazepine  (TEGRETOL  XR) 100 MG 12 hr tablet Take 100-200 mg by mouth See admin instructions. 100mg  in the morning and 200mg  at night.   cyanocobalamin  (VITAMIN B12) 1000 MCG/ML injection INJECT 1 ML every 7 days   dexlansoprazole  (DEXILANT ) 60 MG capsule TAKE ONE CAPSULE BY MOUTH DAILY   esomeprazole  (NEXIUM ) 40 MG capsule Take 1 capsule (40 mg total) by mouth 2 (two) times daily before a meal.   famotidine  (PEPCID ) 20 MG tablet Take 1 tablet (20 mg total) by mouth at bedtime.   finasteride  (PROSCAR ) 5 MG tablet Take 1 tablet (5 mg total) by mouth daily.   gabapentin  (NEURONTIN ) 400 MG capsule Take 400-800 mg by mouth See admin instructions. Taking 400mg  at 0800, 1400, then 2 capsules (800mg ) at bedtime.   hydrochlorothiazide  (MICROZIDE ) 12.5 MG capsule Take  1 capsule (12.5 mg total) by mouth daily.   losartan  (COZAAR ) 50 MG tablet Take 1 tablet (50 mg total) by mouth daily.   polyethylene glycol powder (GLYCOLAX /MIRALAX ) powder DISSOLVE 1 CAPFUL IN LIQUID EVERY DAY AS NEEDED FOR CONSTIPATION   Probiotic Product (ALIGN) 4 MG CAPS Take 4 mg by mouth daily.   sucralfate  (CARAFATE ) 1 g tablet TAKE 1 TABLET BY MOUTH FOUR TIMES DAILY WITH meals AND AT BEDTIME (DO NOT TAKE WITHIN 4 HOURS OF ANY OTHER MEDICATIONS) Please keep your Oct 7th appointment for further refills   tamsulosin   (FLOMAX ) 0.4 MG CAPS capsule Take 0.4 mg by mouth at bedtime.   No facility-administered encounter medications on file as of 07/08/2024.    Allergies (verified) Dilantin  [phenytoin ], Dilaudid  [hydromorphone  hcl], Sulfa antibiotics, Morphine , Ace inhibitors, Augmentin [amoxicillin -pot clavulanate], and Codeine   History: Past Medical History:  Diagnosis Date   Allergic rhinitis    Anemia, pernicious    Anxiety and depression    sees Dr. Vincente   Arthritis    B12 deficiency 01/23/2020   Catheter-associated urinary tract infection (HCC)    Diverticulosis of colon    Elevated PSA    and hypogonadism, sees urologist   GERD (gastroesophageal reflux disease)    hiatal hernia   Hx of blood transfusion reaction 1999   Hyperlipidemia    Hyperparathyroidism (HCC)    Hyperplastic colon polyp    Hypertension    acei causes cough   IBS (irritable bowel syndrome)    Nephrolithiasis    Obstructive chronic bronchitis without exacerbation, followed by Dr. Brien 03/04/2009   ONO RA  Normal 07/28/11 6 min walk >510m no desat PFTs 08/08/11: DLCO 70% TLC 80%  FeV1 80%   Fef 25 75 56% with significant improvement after BD    Peritonitis (HCC)    after surgery in 1999   Pneumonia    PONV (postoperative nausea and vomiting)    Rectal fissure    Trigeminal neuralgia    has facial asymetry   Ventral hernia    Past Surgical History:  Procedure Laterality Date   ABDOMINAL SURGERY     Multiple   BRAIN SURGERY  01/2011   Trigeminal nerve/Dr Unice   CHOLECYSTECTOMY  1981   DENTAL SURGERY  2019   tooth extraction, root canal   ESOPHAGEAL MANOMETRY N/A 04/28/2020   Procedure: ESOPHAGEAL MANOMETRY (EM);  Surgeon: Shila Gustav GAILS, MD;  Location: [facility];  Service: Endoscopy;  Laterality: N/A;   HAND SURGERY  1973   Left   HERNIA REPAIR  1999   KNEE SURGERY  1997   Left   LITHOTRIPSY     Right   NISSEN FUNDOPLICATION  1999    complicated by gastric perforation, peritonitis, ventral nernia     RHIZOTOMY Right 11/17/2014   Procedure: RHIZOTOMY TO RIGHT V2,V3;  Surgeon: Fairy Unice, MD;  Location: [facility];  Service: Neurosurgery;  Laterality: Right;  right   RHIZOTOMY Right 12/22/2014   Procedure: Right V2 and V3 trigeminal rhysolysis;  Surgeon: Fairy Unice, MD;  Location: [facility];  Service: Neurosurgery;  Laterality: Right;  Right V2 and V3 trigeminal rhysolysis   RHIZOTOMY W/ RADIOFREQUENCY ABLATION  08/2017   Dr Mindi   UPPER GASTROINTESTINAL ENDOSCOPY     VENTRAL HERNIA REPAIR  2004   Family History  Problem Relation Age of Onset   Colon polyps Mother    Diabetes Mother    Coronary artery disease Mother  CABG   Diabetes Sister    Diabetes Brother    Hypertension Brother    Heart disease Brother    Heart disease Brother    Colon cancer Neg Hx    Esophageal cancer Neg Hx    Stomach cancer Neg Hx    Pancreatic cancer Neg Hx    Social History   Socioeconomic History   Marital status: Married    Spouse name: Ricka   Number of children: 2   Years of education: Not on file   Highest education level: Not on file  Occupational History   Occupation: Disabled - psych  Tobacco Use   Smoking status: Former    Current packs/day: 0.00    Average packs/day: 1 pack/day for 20.0 years (20.0 ttl pk-yrs)    Types: Cigarettes    Start date: 11/06/1976    Quit date: 11/06/1996    Years since quitting: 27.6   Smokeless tobacco: Former    Types: Chew    Quit date: 11/07/1983  Vaping Use   Vaping status: Never Used  Substance and Sexual Activity   Alcohol use: No   Drug use: No   Sexual activity: Yes    Partners: Female  Other Topics Concern   Not on file  Social History Narrative   Daily Caffeine Use:  Yes      Lives with wife/2025   Social Drivers of Health   Financial Resource Strain: Medium Risk (07/05/2023)   Overall Financial Resource Strain (CARDIA)    Difficulty of Paying Living Expenses: Somewhat hard  Food Insecurity: No Food Insecurity  (07/05/2023)   Hunger Vital Sign    Worried About Running Out of Food in the Last Year: Never true    Ran Out of Food in the Last Year: Never true  Transportation Needs: No Transportation Needs (07/08/2024)   PRAPARE - Administrator, Civil Service (Medical): No    Lack of Transportation (Non-Medical): No  Physical Activity: Insufficiently Active (07/08/2024)   Exercise Vital Sign    Days of Exercise per Week: 7 days    Minutes of Exercise per Session: 20 min  Stress: No Stress Concern Present (07/08/2024)   [employer] of Occupational Health - Occupational Stress Questionnaire    Feeling of Stress: Not at all  Social Connections: Moderately Isolated (07/08/2024)   Social Connection and Isolation Panel    Frequency of Communication with Friends and Family: More than three times a week    Frequency of Social Gatherings with Friends and Family: Three times a week    Attends Religious Services: Never    Active Member of Clubs or Organizations: No    Attends Banker Meetings: Never    Marital Status: Married    Tobacco Counseling Counseling given: Not Answered    Clinical Intake:  Pre-visit preparation completed: Yes  Pain : No/denies pain     BMI - recorded: 33.05 Nutritional Status: BMI > 30  Obese Nutritional Risks: None Diabetes: No  Lab Results  Component Value Date   HGBA1C 5.8 05/23/2024   HGBA1C 5.6 12/07/2023   HGBA1C 5.4 05/15/2023     How often do you need to have someone help you when you read instructions, pamphlets, or other written materials from your doctor or pharmacy?: 1 - Never  Interpreter Needed?: No  Information entered by :: Mersadies Petree, RMA   Activities of Daily Living     07/08/2024    8:40 AM  In your present state of  health, do you have any difficulty performing the following activities:  Hearing? 0  Vision? 0  Difficulty concentrating or making decisions? 0  Walking or climbing stairs? 0  Dressing or  bathing? 0  Doing errands, shopping? 0  Preparing Food and eating ? N  Using the Toilet? N  In the past six months, have you accidently leaked urine? N  Do you have problems with loss of bowel control? N  Managing your Medications? N  Managing your Finances? N  Housekeeping or managing your Housekeeping? N    Patient Care Team: Norleen Lynwood ORN, MD as PCP - General (Internal Medicine) Delford Maude BROCKS, MD as PCP - Cardiology (Cardiology) Ottelin, Mark, MD (Inactive) (Urology) Obie Princella HERO, MD (Inactive) (Gastroenterology) Vincente Grip, MD (Psychiatry) Roddie Bring, DPM (Podiatry) Livingston Rigg, MD (Dermatology) Gladis Cough, MD (General Surgery) Cleatus Collar, MD as Consulting Physician (Ophthalmology) [facility] (Ophthalmology) Nandigam, Kavitha V, MD as Consulting Physician (Gastroenterology)  I have updated your Care Teams any recent Medical Services you may have received from other providers in the past year.     Assessment:   This is a routine wellness examination for [name].  Hearing/Vision screen Hearing Screening - Comments:: Denies hearing difficulties   Vision Screening - Comments:: Wears eyeglasses for reading/ [place] [facility]   Goals Addressed             This Visit's Progress    Stay healthy and maintain.   On track      Depression Screen     07/08/2024    8:54 AM 05/28/2024    1:04 PM 05/23/2024    9:56 AM 12/07/2023   10:19 AM 07/05/2023    8:26 AM 05/15/2023   10:17 AM 11/13/2022   11:26 AM  PHQ 2/9 Scores  PHQ - 2 Score 0 0 0 0 0 0 0  PHQ- 9 Score 0    0      Fall Risk     07/08/2024    8:52 AM 05/28/2024    1:04 PM 05/23/2024    9:56 AM 12/07/2023   10:19 AM 07/05/2023    8:21 AM  Fall Risk   Falls in the past year? 0 0 0 0 0  Number falls in past yr: 0 0 0 0 0  Injury with Fall? 0 0 0 0 0  Risk for fall due to :  No Fall Risks No Fall Risks No Fall Risks No Fall Risks  Follow up Falls evaluation  completed;Falls prevention discussed Falls evaluation completed Falls evaluation completed Falls evaluation completed Falls prevention discussed;Falls evaluation completed    MEDICARE RISK AT HOME:  Medicare Risk at Home Any stairs in or around the home?: No If so, are there any without handrails?: No Home free of loose throw rugs in walkways, pet beds, electrical cords, etc?: Yes Adequate lighting in your home to reduce risk of falls?: Yes Life alert?: No Use of a cane, walker or w/c?: No Grab bars in the bathroom?: No Shower chair or bench in shower?: No Elevated toilet seat or a handicapped toilet?: No  TIMED UP AND GO:  Was the test performed?  No  Cognitive Function: Declined/Normal: No cognitive concerns noted by patient or family. Patient alert, oriented, able to answer questions appropriately and recall recent events. No signs of memory loss or confusion.        07/05/2023    8:22 AM 07/05/2022    1:47 PM  6CIT Screen  What Year? 0  points 0 points  What month? 0 points 0 points  What time? 0 points 0 points  Count back from 20 0 points 0 points  Months in reverse 0 points 0 points  Repeat phrase 0 points 0 points  Total Score 0 points 0 points    Immunizations Immunization History  Administered Date(s) Administered   Fluad Quad(high Dose 65+) 10/23/2022   Influenza Split 09/12/2021   Influenza Whole 09/23/2007, 08/06/2010, 08/22/2011   Influenza,inj,Quad PF,6+ Mos 09/21/2015, 07/14/2016   Influenza-Unspecified 09/08/2014, 08/10/2017, 08/19/2019, 09/10/2023   Moderna Sars-Covid-2 Vaccination 03/24/2020, 04/20/2020, 10/06/2020   Pneumococcal Conjugate-13 11/12/2020   Pneumococcal Polysaccharide-23 11/07/1999, 09/14/2017, 01/24/2018   RSV,unspecified 10/23/2022   Td 05/03/2005   Tdap 01/18/2017   Zoster Recombinant(Shingrix) 06/24/2021, 09/05/2021    Screening Tests Health Maintenance  Topic Date Due   INFLUENZA VACCINE  06/06/2024   Colonoscopy  08/24/2024    Medicare Annual Wellness (AWV)  07/08/2025   DTaP/Tdap/Td (3 - Td or Tdap) 01/19/2027   Pneumococcal Vaccine: 50+ Years  Completed   Hepatitis C Screening  Completed   Zoster Vaccines- Shingrix  Completed   HPV VACCINES  Aged Out   Meningococcal B Vaccine  Aged Out   COVID-19 Vaccine  Discontinued    Health Maintenance  Health Maintenance Due  Topic Date Due   INFLUENZA VACCINE  06/06/2024   Colonoscopy  08/24/2024   Health Maintenance Items Addressed: See Nurse Notes at the end of this note  Additional Screening:  Vision Screening: Recommended annual ophthalmology exams for early detection of glaucoma and other disorders of the eye. Would you like a referral to an eye doctor? No    Dental Screening: Recommended annual dental exams for proper oral hygiene  Community Resource Referral / Chronic Care Management: CRR required this visit?  No   CCM required this visit?  No   Plan:    I have personally reviewed and noted the following in the patient's chart:   Medical and social history Use of alcohol, tobacco or illicit drugs  Current medications and supplements including opioid prescriptions. Patient is not currently taking opioid prescriptions. Functional ability and status Nutritional status Physical activity Advanced directives List of other physicians Hospitalizations, surgeries, and ER visits in previous 12 months Vitals Screenings to include cognitive, depression, and falls Referrals and appointments  In addition, I have reviewed and discussed with patient certain preventive protocols, quality metrics, and best practice recommendations. A written personalized care plan for preventive services as well as general preventive health recommendations were provided to patient.   [name], CMA   07/08/2024   After Visit Summary: (MyChart) Due to this being a telephonic visit, the after visit summary with patients personalized plan was offered to patient via  MyChart   Notes: Patient is up to date on all health maintenance with no concerns to address today.  Patient stated that he is scheduled for a consultation for colonoscopy on 08/08/2024.

## 2024-07-10 ENCOUNTER — Encounter: Payer: Self-pay | Admitting: Internal Medicine

## 2024-07-10 ENCOUNTER — Ambulatory Visit (INDEPENDENT_AMBULATORY_CARE_PROVIDER_SITE_OTHER): Admitting: Internal Medicine

## 2024-07-10 VITALS — BP 142/90 | HR 57 | Temp 97.5°F | Ht 71.0 in | Wt 234.0 lb

## 2024-07-10 DIAGNOSIS — R739 Hyperglycemia, unspecified: Secondary | ICD-10-CM | POA: Diagnosis not present

## 2024-07-10 DIAGNOSIS — I10 Essential (primary) hypertension: Secondary | ICD-10-CM | POA: Diagnosis not present

## 2024-07-10 DIAGNOSIS — E538 Deficiency of other specified B group vitamins: Secondary | ICD-10-CM | POA: Diagnosis not present

## 2024-07-10 DIAGNOSIS — E559 Vitamin D deficiency, unspecified: Secondary | ICD-10-CM | POA: Diagnosis not present

## 2024-07-10 NOTE — Assessment & Plan Note (Signed)
 Last vitamin D  Lab Results  Component Value Date   VD25OH 27.42 (L) 05/23/2024   Low, to start oral replacement

## 2024-07-10 NOTE — Assessment & Plan Note (Signed)
 Remains labile but no longer with symptomatic low Bps at times; ok to continue current med tx including losartan  50, hct 12.5  and amlodipine  2.5 mg every day  BP Readings from Last 3 Encounters:  07/10/24 (!) 142/90  06/19/24 (!) 158/100  06/11/24 108/70

## 2024-07-10 NOTE — Assessment & Plan Note (Signed)
.   Lab Results  Component Value Date   HGBA1C 5.8 05/23/2024   Stable, pt to continue current medical treatment - diet, wt control

## 2024-07-10 NOTE — Assessment & Plan Note (Signed)
 Lab Results  Component Value Date   VITAMINB12 846 12/07/2023   Stable, cont oral replacement - b12 1000 mcg qd

## 2024-07-10 NOTE — Progress Notes (Signed)
 Patient ID: [name], male   DOB: Dec 28, 1950, 73 y.o.   MRN: [id number]        Chief Complaint: follow up HTN labile, hyperglycemia, low vit d and b12       HPI:  [name] is a 73 y.o. male here with BP improved in that he no longer has symptomatic low blood pressures, but remains mild elevated at times.  Pt denies chest pain, increased sob or doe, wheezing, orthopnea, PND, increased LE swelling, palpitations, dizziness or syncope.   Pt denies polydipsia, polyuria, or new focal neuro s/s.          Wt Readings from Last 3 Encounters:  07/10/24 234 lb (106.1 kg)  07/08/24 237 lb (107.5 kg)  06/19/24 237 lb (107.5 kg)   BP Readings from Last 3 Encounters:  07/10/24 (!) 142/90  06/19/24 (!) 158/100  06/11/24 108/70         Past Medical History:  Diagnosis Date   Allergic rhinitis    Anemia, pernicious    Anxiety and depression    sees Dr. Vincente   Arthritis    B12 deficiency 01/23/2020   Catheter-associated urinary tract infection (HCC)    Diverticulosis of colon    Elevated PSA    and hypogonadism, sees urologist   GERD (gastroesophageal reflux disease)    hiatal hernia   Hx of blood transfusion reaction 1999   Hyperlipidemia    Hyperparathyroidism (HCC)    Hyperplastic colon polyp    Hypertension    acei causes cough   IBS (irritable bowel syndrome)    Nephrolithiasis    Obstructive chronic bronchitis without exacerbation, followed by Dr. Brien 03/04/2009   ONO RA  Normal 07/28/11 6 min walk >567m no desat PFTs 08/08/11: DLCO 70% TLC 80%  FeV1 80%   Fef 25 75 56% with significant improvement after BD    Peritonitis (HCC)    after surgery in 1999   Pneumonia    PONV (postoperative nausea and vomiting)    Rectal fissure    Trigeminal neuralgia    has facial asymetry   Ventral hernia    Past Surgical History:  Procedure Laterality Date   ABDOMINAL SURGERY     Multiple   BRAIN SURGERY  01/2011   Trigeminal nerve/Dr Unice   CHOLECYSTECTOMY   1981   DENTAL SURGERY  2019   tooth extraction, root canal   ESOPHAGEAL MANOMETRY N/A 04/28/2020   Procedure: ESOPHAGEAL MANOMETRY (EM);  Surgeon: Shila Gustav GAILS, MD;  Location: [facility];  Service: Endoscopy;  Laterality: N/A;   HAND SURGERY  1973   Left   HERNIA REPAIR  1999   KNEE SURGERY  1997   Left   LITHOTRIPSY     Right   NISSEN FUNDOPLICATION  1999    complicated by gastric perforation, peritonitis, ventral nernia    RHIZOTOMY Right 11/17/2014   Procedure: RHIZOTOMY TO RIGHT V2,V3;  Surgeon: Fairy Unice, MD;  [facility];  Service: Neurosurgery;  Laterality: Right;  right   RHIZOTOMY Right 12/22/2014   Procedure: Right V2 and V3 trigeminal rhysolysis;  Surgeon: Fairy Unice, MD;  Location: [facility];  Service: Neurosurgery;  Laterality: Right;  Right V2 and V3 trigeminal rhysolysis   RHIZOTOMY W/ RADIOFREQUENCY ABLATION  08/2017   Dr Mindi   UPPER GASTROINTESTINAL ENDOSCOPY     VENTRAL HERNIA REPAIR  2004    reports that he quit smoking about 27 years ago. His smoking use included cigarettes. He  started smoking about 47 years ago. He has a 20 pack-year smoking history. He quit smokeless tobacco use about 40 years ago.  His smokeless tobacco use included chew. He reports that he does not drink alcohol and does not use drugs. family history includes Colon polyps in his mother; Coronary artery disease in his mother; Diabetes in his brother, mother, and sister; Heart disease in his brother and brother; Hypertension in his brother. Allergies  Allergen Reactions   Dilantin  [Phenytoin ] Other (See Comments)    bradycardia   Dilaudid  [Hydromorphone  Hcl] Nausea And Vomiting   Sulfa Antibiotics Hives   Morphine     Ace Inhibitors Cough   Augmentin [Amoxicillin -Pot Clavulanate] Nausea And Vomiting   Codeine Nausea And Vomiting   Current Outpatient Medications on File Prior to Visit  Medication Sig Dispense Refill   acetaminophen  (TYLENOL ) 325 MG tablet Take 2  tablets (650 mg total) by mouth every 6 (six) hours as needed for mild pain, moderate pain or fever.     ALPRAZolam  (XANAX ) 1 MG tablet TAKE 1 TABLET BY MOUTH FOUR TIMES DAILY AS NEEDED FOR ANXIETY 120 tablet 2   amLODipine  (NORVASC ) 2.5 MG tablet Take 1 tablet (2.5 mg total) by mouth daily. 90 tablet 3   carbamazepine  (TEGRETOL  XR) 100 MG 12 hr tablet Take 100-200 mg by mouth See admin instructions. 100mg  in the morning and 200mg  at night.     cyanocobalamin  (VITAMIN B12) 1000 MCG/ML injection INJECT 1 ML every 7 days 60 mL 3   dexlansoprazole  (DEXILANT ) 60 MG capsule TAKE ONE CAPSULE BY MOUTH DAILY 30 capsule 5   esomeprazole  (NEXIUM ) 40 MG capsule Take 1 capsule (40 mg total) by mouth 2 (two) times daily before a meal. 180 capsule 3   famotidine  (PEPCID ) 20 MG tablet Take 1 tablet (20 mg total) by mouth at bedtime. 90 tablet 3   finasteride  (PROSCAR ) 5 MG tablet Take 1 tablet (5 mg total) by mouth daily. 90 tablet 3   gabapentin  (NEURONTIN ) 400 MG capsule Take 400-800 mg by mouth See admin instructions. Taking 400mg  at 0800, 1400, then 2 capsules (800mg ) at bedtime.     hydrochlorothiazide  (MICROZIDE ) 12.5 MG capsule Take 1 capsule (12.5 mg total) by mouth daily. 90 capsule 3   losartan  (COZAAR ) 50 MG tablet Take 1 tablet (50 mg total) by mouth daily. 90 tablet 3   polyethylene glycol powder (GLYCOLAX /MIRALAX ) powder DISSOLVE 1 CAPFUL IN LIQUID EVERY DAY AS NEEDED FOR CONSTIPATION 527 g 2   Probiotic Product (ALIGN) 4 MG CAPS Take 4 mg by mouth daily.     sucralfate  (CARAFATE ) 1 g tablet TAKE 1 TABLET BY MOUTH FOUR TIMES DAILY WITH meals AND AT BEDTIME (DO NOT TAKE WITHIN 4 HOURS OF ANY OTHER MEDICATIONS) Please keep your Oct 7th appointment for further refills 150 tablet 0   tamsulosin  (FLOMAX ) 0.4 MG CAPS capsule Take 0.4 mg by mouth at bedtime.     No current facility-administered medications on file prior to visit.        ROS:  All others reviewed and negative.  Objective        PE:  BP  (!) 142/90   Pulse (!) 57   Temp (!) 97.5 F (36.4 C)   Ht 5' 11 (1.803 m)   Wt 234 lb (106.1 kg)   SpO2 99%   BMI 32.64 kg/m                 Constitutional: Pt appears in NAD  HENT: Head: NCAT.                Right Ear: External ear normal.                 Left Ear: External ear normal.                Eyes: . Pupils are equal, round, and reactive to light. Conjunctivae and EOM are normal               Nose: without d/c or deformity               Neck: Neck supple. Gross normal ROM               Cardiovascular: Normal rate and regular rhythm.                 Pulmonary/Chest: Effort normal and breath sounds without rales or wheezing.                Abd:  Soft, NT, ND, + BS, no organomegaly               Neurological: Pt is alert. At baseline orientation, motor grossly intact               Skin: Skin is warm. No rashes, no other new lesions, LE edema - none               Psychiatric: Pt behavior is normal without agitation   Micro: none  Cardiac tracings I have personally interpreted today:  none  Pertinent Radiological findings (summarize): none   Lab Results  Component Value Date   WBC 6.3 05/23/2024   HGB 15.5 05/23/2024   HCT 46.0 05/23/2024   PLT 151.0 05/23/2024   GLUCOSE 92 05/23/2024   CHOL 170 05/23/2024   TRIG 140.0 05/23/2024   HDL 57.40 05/23/2024   LDLCALC 84 05/23/2024   ALT 9 05/23/2024   AST 11 05/23/2024   NA 133 (L) 05/23/2024   K 4.5 05/23/2024   CL 95 (L) 05/23/2024   CREATININE 0.78 05/23/2024   BUN 11 05/23/2024   CO2 31 05/23/2024   TSH 1.69 05/23/2024   PSA 2.64 05/23/2024   HGBA1C 5.8 05/23/2024   Assessment/Plan:  Maclovio Henson is a 73 y.o. White or Caucasian [1] male with  has a past medical history of Allergic rhinitis, Anemia, pernicious, Anxiety and depression, Arthritis, B12 deficiency (01/23/2020), Catheter-associated urinary tract infection (HCC), Diverticulosis of colon, Elevated PSA, GERD (gastroesophageal  reflux disease), blood transfusion reaction (1999), Hyperlipidemia, Hyperparathyroidism (HCC), Hyperplastic colon polyp, Hypertension, IBS (irritable bowel syndrome), Nephrolithiasis, Obstructive chronic bronchitis without exacerbation, followed by Dr. Brien (03/04/2009), Peritonitis (HCC), Pneumonia, PONV (postoperative nausea and vomiting), Rectal fissure, Trigeminal neuralgia, and Ventral hernia.  B12 deficiency Lab Results  Component Value Date   VITAMINB12 846 12/07/2023   Stable, cont oral replacement - b12 1000 mcg qd   Essential hypertension Remains labile but no longer with symptomatic low Bps at times; ok to continue current med tx including losartan  50, hct 12.5  and amlodipine  2.5 mg every day  BP Readings from Last 3 Encounters:  07/10/24 (!) 142/90  06/19/24 (!) 158/100  06/11/24 108/70     Hyperglycemia Lab Results  Component Value Date   HGBA1C 5.8 05/23/2024   Stable, pt to continue current medical treatment  - diet, wt control   Vitamin D  deficiency Last vitamin D  Lab Results  Component Value Date   VD25OH  27.42 (L) 05/23/2024   Low, to start oral replacement  Followup: Return in about 6 months (around 01/07/2025).  Lynwood Rush, MD 07/10/2024 12:07 PM [facility]

## 2024-07-10 NOTE — Patient Instructions (Signed)
 Please continue all other medications as before, including the losartan , HCT and amlodipine  all lower doses due to the BP being too low at times  Please continue to monitor the BP at home as you do  Please have the pharmacy call with any other refills you may need.  Please continue your efforts at being more active, low cholesterol diet, and weight control  Please keep your appointments with your specialists as you may have planned  We can hold on lab testing today  Please make an Appointment to return in 6 months, or sooner if needed

## 2024-07-13 ENCOUNTER — Other Ambulatory Visit: Payer: Self-pay | Admitting: Internal Medicine

## 2024-07-15 DIAGNOSIS — C44629 Squamous cell carcinoma of skin of left upper limb, including shoulder: Secondary | ICD-10-CM | POA: Diagnosis not present

## 2024-07-28 ENCOUNTER — Emergency Department (HOSPITAL_COMMUNITY)

## 2024-07-28 ENCOUNTER — Other Ambulatory Visit: Payer: Self-pay

## 2024-07-28 ENCOUNTER — Encounter (HOSPITAL_COMMUNITY): Payer: Self-pay

## 2024-07-28 ENCOUNTER — Observation Stay (HOSPITAL_COMMUNITY)

## 2024-07-28 ENCOUNTER — Inpatient Hospital Stay (HOSPITAL_COMMUNITY)
Admission: EM | Admit: 2024-07-28 | Discharge: 2024-08-01 | DRG: 389 | Disposition: A | Discharge status: Home / Self Care | Attending: Internal Medicine | Admitting: Internal Medicine

## 2024-07-28 DIAGNOSIS — Z833 Family history of diabetes mellitus: Secondary | ICD-10-CM

## 2024-07-28 DIAGNOSIS — R109 Unspecified abdominal pain: Secondary | ICD-10-CM | POA: Diagnosis not present

## 2024-07-28 DIAGNOSIS — Z8249 Family history of ischemic heart disease and other diseases of the circulatory system: Secondary | ICD-10-CM

## 2024-07-28 DIAGNOSIS — K5651 Intestinal adhesions [bands], with partial obstruction: Secondary | ICD-10-CM | POA: Diagnosis not present

## 2024-07-28 DIAGNOSIS — Z882 Allergy status to sulfonamides status: Secondary | ICD-10-CM | POA: Diagnosis not present

## 2024-07-28 DIAGNOSIS — K439 Ventral hernia without obstruction or gangrene: Secondary | ICD-10-CM | POA: Diagnosis not present

## 2024-07-28 DIAGNOSIS — Z880 Allergy status to penicillin: Secondary | ICD-10-CM

## 2024-07-28 DIAGNOSIS — G500 Trigeminal neuralgia: Secondary | ICD-10-CM | POA: Diagnosis present

## 2024-07-28 DIAGNOSIS — R112 Nausea with vomiting, unspecified: Secondary | ICD-10-CM | POA: Diagnosis present

## 2024-07-28 DIAGNOSIS — E538 Deficiency of other specified B group vitamins: Secondary | ICD-10-CM | POA: Diagnosis not present

## 2024-07-28 DIAGNOSIS — E785 Hyperlipidemia, unspecified: Secondary | ICD-10-CM | POA: Diagnosis present

## 2024-07-28 DIAGNOSIS — K449 Diaphragmatic hernia without obstruction or gangrene: Secondary | ICD-10-CM | POA: Diagnosis present

## 2024-07-28 DIAGNOSIS — E871 Hypo-osmolality and hyponatremia: Secondary | ICD-10-CM | POA: Diagnosis not present

## 2024-07-28 DIAGNOSIS — K5730 Diverticulosis of large intestine without perforation or abscess without bleeding: Secondary | ICD-10-CM | POA: Diagnosis present

## 2024-07-28 DIAGNOSIS — N390 Urinary tract infection, site not specified: Secondary | ICD-10-CM | POA: Diagnosis not present

## 2024-07-28 DIAGNOSIS — K56609 Unspecified intestinal obstruction, unspecified as to partial versus complete obstruction: Principal | ICD-10-CM

## 2024-07-28 DIAGNOSIS — Z885 Allergy status to narcotic agent status: Secondary | ICD-10-CM | POA: Diagnosis not present

## 2024-07-28 DIAGNOSIS — R140 Abdominal distension (gaseous): Secondary | ICD-10-CM | POA: Diagnosis not present

## 2024-07-28 DIAGNOSIS — Z9049 Acquired absence of other specified parts of digestive tract: Secondary | ICD-10-CM

## 2024-07-28 DIAGNOSIS — K56600 Partial intestinal obstruction, unspecified as to cause: Secondary | ICD-10-CM | POA: Diagnosis present

## 2024-07-28 DIAGNOSIS — Z888 Allergy status to other drugs, medicaments and biological substances status: Secondary | ICD-10-CM | POA: Diagnosis not present

## 2024-07-28 DIAGNOSIS — K219 Gastro-esophageal reflux disease without esophagitis: Secondary | ICD-10-CM | POA: Diagnosis present

## 2024-07-28 DIAGNOSIS — Z4682 Encounter for fitting and adjustment of non-vascular catheter: Secondary | ICD-10-CM | POA: Diagnosis not present

## 2024-07-28 DIAGNOSIS — K432 Incisional hernia without obstruction or gangrene: Secondary | ICD-10-CM | POA: Diagnosis present

## 2024-07-28 DIAGNOSIS — N400 Enlarged prostate without lower urinary tract symptoms: Secondary | ICD-10-CM | POA: Diagnosis present

## 2024-07-28 DIAGNOSIS — Z79899 Other long term (current) drug therapy: Secondary | ICD-10-CM | POA: Diagnosis not present

## 2024-07-28 DIAGNOSIS — I10 Essential (primary) hypertension: Secondary | ICD-10-CM | POA: Diagnosis not present

## 2024-07-28 DIAGNOSIS — F419 Anxiety disorder, unspecified: Secondary | ICD-10-CM | POA: Diagnosis present

## 2024-07-28 DIAGNOSIS — N200 Calculus of kidney: Secondary | ICD-10-CM | POA: Diagnosis not present

## 2024-07-28 DIAGNOSIS — F32A Depression, unspecified: Secondary | ICD-10-CM | POA: Diagnosis present

## 2024-07-28 DIAGNOSIS — Z8701 Personal history of pneumonia (recurrent): Secondary | ICD-10-CM

## 2024-07-28 DIAGNOSIS — Z87891 Personal history of nicotine dependence: Secondary | ICD-10-CM

## 2024-07-28 DIAGNOSIS — J9811 Atelectasis: Secondary | ICD-10-CM | POA: Diagnosis not present

## 2024-07-28 DIAGNOSIS — K5669 Other partial intestinal obstruction: Secondary | ICD-10-CM | POA: Diagnosis not present

## 2024-07-28 LAB — COMPREHENSIVE METABOLIC PANEL WITH GFR
ALT: 15 U/L (ref 0–44)
AST: 20 U/L (ref 15–41)
Albumin: 4 g/dL (ref 3.5–5.0)
Alkaline Phosphatase: 85 U/L (ref 38–126)
Anion gap: 12 (ref 5–15)
BUN: 7 mg/dL — ABNORMAL LOW (ref 8–23)
CO2: 28 mmol/L (ref 22–32)
Calcium: 9.3 mg/dL (ref 8.9–10.3)
Chloride: 93 mmol/L — ABNORMAL LOW (ref 98–111)
Creatinine, Ser: 1.04 mg/dL (ref 0.61–1.24)
GFR, Estimated: >60 mL/min (ref >60)
Glucose, Bld: 141 mg/dL — ABNORMAL HIGH (ref 70–99)
Potassium: 3.9 mmol/L (ref 3.5–5.1)
Sodium: 133 mmol/L — ABNORMAL LOW (ref 135–145)
Total Bilirubin: 0.8 mg/dL (ref 0.0–1.2)
Total Protein: 7.4 g/dL (ref 6.5–8.1)

## 2024-07-28 LAB — URINALYSIS, ROUTINE W REFLEX MICROSCOPIC
Bilirubin Urine: NEGATIVE
Glucose, UA: NEGATIVE mg/dL
Hgb urine dipstick: NEGATIVE
Ketones, ur: NEGATIVE mg/dL
Nitrite: NEGATIVE
Protein, ur: 30 mg/dL — AB
Specific Gravity, Urine: 1.013 (ref 1.005–1.030)
pH: 7 (ref 5.0–8.0)

## 2024-07-28 LAB — I-STAT CHEM 8, ED
BUN: 8 mg/dL (ref 8–23)
Calcium, Ion: 1.1 mmol/L — ABNORMAL LOW (ref 1.15–1.40)
Chloride: 94 mmol/L — ABNORMAL LOW (ref 98–111)
Creatinine, Ser: 1 mg/dL (ref 0.61–1.24)
Glucose, Bld: 147 mg/dL — ABNORMAL HIGH (ref 70–99)
HCT: 52 % (ref 39.0–52.0)
Hemoglobin: 17.7 g/dL — ABNORMAL HIGH (ref 13.0–17.0)
Potassium: 4.2 mmol/L (ref 3.5–5.1)
Sodium: 132 mmol/L — ABNORMAL LOW (ref 135–145)
TCO2: 27 mmol/L (ref 22–32)

## 2024-07-28 LAB — CBC
HCT: 51 % (ref 39.0–52.0)
Hemoglobin: 17.9 g/dL — ABNORMAL HIGH (ref 13.0–17.0)
MCH: 31.1 pg (ref 26.0–34.0)
MCHC: 35.1 g/dL (ref 30.0–36.0)
MCV: 88.7 fL (ref 80.0–100.0)
Platelets: 198 K/uL (ref 150–400)
RBC: 5.75 MIL/uL (ref 4.22–5.81)
RDW: 12.8 % (ref 11.5–15.5)
WBC: 10.2 K/uL (ref 4.0–10.5)
nRBC: 0 % (ref 0.0–0.2)

## 2024-07-28 LAB — MAGNESIUM: Magnesium: 1.9 mg/dL (ref 1.7–2.4)

## 2024-07-28 LAB — LIPASE, BLOOD: Lipase: 13 U/L (ref 11–51)

## 2024-07-28 MED ORDER — DIATRIZOATE MEGLUMINE & SODIUM 66-10 % PO SOLN
90.0000 mL | Freq: Once | ORAL | Status: AC
  Administered 2024-07-28: 90 mL via NASOGASTRIC
  Filled 2024-07-28: qty 90

## 2024-07-28 MED ORDER — LOSARTAN POTASSIUM 50 MG PO TABS
50.0000 mg | ORAL_TABLET | Freq: Every day | ORAL | Status: DC
  Administered 2024-07-28 – 2024-08-01 (×5): 50 mg via ORAL
  Filled 2024-07-28 (×5): qty 1

## 2024-07-28 MED ORDER — ONDANSETRON HCL 4 MG/2ML IJ SOLN
4.0000 mg | Freq: Four times a day (QID) | INTRAMUSCULAR | Status: AC | PRN
  Administered 2024-07-28 – 2024-07-29 (×2): 4 mg via INTRAVENOUS
  Filled 2024-07-28 (×2): qty 2

## 2024-07-28 MED ORDER — SODIUM CHLORIDE 0.9 % IV SOLN
INTRAVENOUS | Status: DC
Start: 2024-07-28 — End: 2024-07-30

## 2024-07-28 MED ORDER — CARBAMAZEPINE ER 200 MG PO TB12
200.0000 mg | ORAL_TABLET | Freq: Every day | ORAL | Status: DC
  Administered 2024-07-28 – 2024-07-31 (×4): 200 mg via ORAL
  Filled 2024-07-28 (×5): qty 1

## 2024-07-28 MED ORDER — ONDANSETRON HCL 4 MG/2ML IJ SOLN
4.0000 mg | Freq: Once | INTRAMUSCULAR | Status: AC
  Administered 2024-07-28: 4 mg via INTRAVENOUS
  Filled 2024-07-28: qty 2

## 2024-07-28 MED ORDER — ALPRAZOLAM 0.5 MG PO TABS: 0.5000 mg | ORAL_TABLET | Freq: Three times a day (TID) | ORAL | Status: DC | PRN

## 2024-07-28 MED ORDER — GABAPENTIN 400 MG PO CAPS
800.0000 mg | ORAL_CAPSULE | Freq: Every day | ORAL | Status: DC
  Administered 2024-07-28 – 2024-07-31 (×4): 800 mg via ORAL
  Filled 2024-07-28 (×4): qty 2

## 2024-07-28 MED ORDER — GABAPENTIN 100 MG PO CAPS: 400.0000 mg | ORAL_CAPSULE | ORAL | Status: DC

## 2024-07-28 MED ORDER — HEPARIN SODIUM (PORCINE) 5000 UNIT/ML IJ SOLN
5000.0000 [IU] | Freq: Three times a day (TID) | INTRAMUSCULAR | Status: DC
  Administered 2024-07-28 – 2024-08-01 (×12): 5000 [IU] via SUBCUTANEOUS
  Filled 2024-07-28 (×13): qty 1

## 2024-07-28 MED ORDER — TAMSULOSIN HCL 0.4 MG PO CAPS
0.4000 mg | ORAL_CAPSULE | Freq: Every day | ORAL | Status: DC
  Administered 2024-07-28 – 2024-07-31 (×4): 0.4 mg via ORAL
  Filled 2024-07-28 (×4): qty 1

## 2024-07-28 MED ORDER — IOHEXOL 350 MG/ML SOLN
75.0000 mL | Freq: Once | INTRAVENOUS | Status: AC | PRN
  Administered 2024-07-28: 75 mL via INTRAVENOUS

## 2024-07-28 MED ORDER — SODIUM CHLORIDE 0.9% FLUSH
3.0000 mL | Freq: Two times a day (BID) | INTRAVENOUS | Status: DC
  Administered 2024-07-28 – 2024-08-01 (×7): 3 mL via INTRAVENOUS

## 2024-07-28 MED ORDER — FINASTERIDE 5 MG PO TABS
5.0000 mg | ORAL_TABLET | Freq: Every day | ORAL | Status: DC
  Administered 2024-07-28 – 2024-08-01 (×5): 5 mg via ORAL
  Filled 2024-07-28 (×5): qty 1

## 2024-07-28 MED ORDER — ACETAMINOPHEN 325 MG PO TABS: 650.0000 mg | ORAL_TABLET | Freq: Four times a day (QID) | ORAL | Status: DC | PRN

## 2024-07-28 MED ORDER — ACETAMINOPHEN 650 MG RE SUPP: 650.0000 mg | Freq: Four times a day (QID) | RECTAL | Status: DC | PRN

## 2024-07-28 MED ORDER — CARBAMAZEPINE ER 100 MG PO TB12
100.0000 mg | ORAL_TABLET | Freq: Every day | ORAL | Status: DC
  Administered 2024-07-29 – 2024-08-01 (×4): 100 mg via ORAL
  Filled 2024-07-28 (×4): qty 1

## 2024-07-28 MED ORDER — GABAPENTIN 400 MG PO CAPS
400.0000 mg | ORAL_CAPSULE | ORAL | Status: DC
Start: 2024-07-29 — End: 2024-08-01
  Administered 2024-07-29 – 2024-08-01 (×7): 400 mg via ORAL
  Filled 2024-07-28 (×7): qty 1

## 2024-07-28 MED ORDER — ONDANSETRON HCL 4 MG/2ML IJ SOLN
4.0000 mg | Freq: Once | INTRAMUSCULAR | Status: AC | PRN
  Administered 2024-07-28: 4 mg via INTRAVENOUS
  Filled 2024-07-28: qty 2

## 2024-07-28 MED ORDER — CARBAMAZEPINE ER 100 MG PO TB12: 100.0000 mg | ORAL_TABLET | ORAL | Status: DC

## 2024-07-28 MED ORDER — SODIUM CHLORIDE 0.9 % IV BOLUS
500.0000 mL | Freq: Once | INTRAVENOUS | Status: AC
  Administered 2024-07-28: 500 mL via INTRAVENOUS

## 2024-07-28 MED ORDER — HYDRALAZINE HCL 20 MG/ML IJ SOLN
5.0000 mg | Freq: Four times a day (QID) | INTRAMUSCULAR | Status: DC | PRN
  Administered 2024-07-30: 5 mg via INTRAVENOUS
  Filled 2024-07-28: qty 1

## 2024-07-28 MED ORDER — AMLODIPINE BESYLATE 2.5 MG PO TABS
2.5000 mg | ORAL_TABLET | Freq: Every day | ORAL | Status: DC
  Administered 2024-07-28 – 2024-08-01 (×5): 2.5 mg via ORAL
  Filled 2024-07-28 (×5): qty 1

## 2024-07-28 MED ORDER — BUDESONIDE 0.5 MG/2ML IN SUSP
0.5000 mg | Freq: Two times a day (BID) | RESPIRATORY_TRACT | Status: DC
Start: 2024-07-28 — End: 2024-07-30
  Administered 2024-07-28 – 2024-07-29 (×2): 0.5 mg via RESPIRATORY_TRACT
  Filled 2024-07-28 (×2): qty 2

## 2024-07-28 NOTE — Consult Note (Signed)
 [name] 08/28/1951  [id number].    Requesting MD: Pamella, MD Chief Complaint/Reason for Consult: SBO  HPI:  [name] is a 73 y/o M who presents with abdominal pain. Reports lower abdominal pain that started 2-3 days ago and is not improving. Also reports associated back pain. He denies aggrivating or alleviating factors. Associated symptoms include nausea, dry heaves. He is unsure when he last passed gas last BM was this AM and was small and soft, non-bloody. He denies fever, chills, chest pain, diarrhea, melena, or hematochezia. Still takes miralax  daily.   His surgical history is significant for nissen fundoplication in 1999 complicated by perforation, ventral hernia repair with gortex mesh 2001, another hernia repair by [facility], and cholecystectomy.  He is a former cigarette smoker who quit in 1998. He denies other drug or alcohol use. At baseline he lives at home in [place] with his wife. He is retired.   Of note, this patient saw Dr. Lyndel 05/17/2023 to establish care for his known ventral incisional hernia as well as he recurrent hiatal hernia. He was previously followed by Dr. Lunger, who retired. During their visit they decided to continue observation of his hernia and hold off surgery. He has also seen Dr. Rubin, Dr. Lyndel and Dr. Abelardo in [place] who all did not recommend hernia repair.   ROS: Negative other than HPI  Family History  Problem Relation Age of Onset   Colon polyps Mother    Diabetes Mother    Coronary artery disease Mother        CABG   Diabetes Sister    Diabetes Brother    Hypertension Brother    Heart disease Brother    Heart disease Brother    Colon cancer Neg Hx    Esophageal cancer Neg Hx    Stomach cancer Neg Hx    Pancreatic cancer Neg Hx     Past Medical History:  Diagnosis Date   Allergic rhinitis    Anemia, pernicious    Anxiety and depression    sees Dr. Vincente   Arthritis    B12  deficiency 01/23/2020   Catheter-associated urinary tract infection    Diverticulosis of colon    Elevated PSA    and hypogonadism, sees urologist   GERD (gastroesophageal reflux disease)    hiatal hernia   Hx of blood transfusion reaction 1999   Hyperlipidemia    Hyperparathyroidism    Hyperplastic colon polyp    Hypertension    acei causes cough   IBS (irritable bowel syndrome)    Nephrolithiasis    Obstructive chronic bronchitis without exacerbation, followed by Dr. Brien 03/04/2009   ONO RA  Normal 07/28/11 6 min walk >574m no desat PFTs 08/08/11: DLCO 70% TLC 80%  FeV1 80%   Fef 25 75 56% with significant improvement after BD    Peritonitis (HCC)    after surgery in 1999   Pneumonia    PONV (postoperative nausea and vomiting)    Rectal fissure    Trigeminal neuralgia    has facial asymetry   Ventral hernia     Past Surgical History:  Procedure Laterality Date   ABDOMINAL SURGERY     Multiple   BRAIN SURGERY  01/2011   Trigeminal nerve/Dr Unice   CHOLECYSTECTOMY  1981   DENTAL SURGERY  2019   tooth extraction, root canal   ESOPHAGEAL MANOMETRY N/A 04/28/2020   Procedure: ESOPHAGEAL MANOMETRY (EM);  Surgeon: Nandigam, Kavitha V, MD;  Location:  [facility];  Service: Endoscopy;  Laterality: N/A;   HAND SURGERY  1973   Left   HERNIA REPAIR  1999   KNEE SURGERY  1997   Left   LITHOTRIPSY     Right   NISSEN FUNDOPLICATION  1999    complicated by gastric perforation, peritonitis, ventral nernia    RHIZOTOMY Right 11/17/2014   Procedure: RHIZOTOMY TO RIGHT V2,V3;  Surgeon: Fairy Levels, MD;  [facility]: [facility];  Service: Neurosurgery;  Laterality: Right;  right   RHIZOTOMY Right 12/22/2014   Procedure: Right V2 and V3 trigeminal rhysolysis;  Surgeon: Fairy Levels, MD;  Location: [facility];  Service: Neurosurgery;  Laterality: Right;  Right V2 and V3 trigeminal rhysolysis   RHIZOTOMY W/ RADIOFREQUENCY ABLATION  08/2017   Dr Mindi   UPPER GASTROINTESTINAL ENDOSCOPY      VENTRAL HERNIA REPAIR  2004    Social History:  reports that he quit smoking about 27 years ago. His smoking use included cigarettes. He started smoking about 47 years ago. He has a 20 pack-year smoking history. He quit smokeless tobacco use about 40 years ago.  His smokeless tobacco use included chew. He reports that he does not drink alcohol and does not use drugs.  Allergies:  Allergies  Allergen Reactions   Dilantin  [Phenytoin ] Other (See Comments)    bradycardia   Dilaudid  [Hydromorphone  Hcl] Nausea And Vomiting   Sulfa Antibiotics Hives   Morphine     Ace Inhibitors Cough   Augmentin [Amoxicillin -Pot Clavulanate] Nausea And Vomiting   Codeine Nausea And Vomiting    (Not in a hospital admission)    Physical Exam: Blood pressure 139/77, pulse 90, temperature 97.6 F (36.4 C), resp. rate 20, SpO2 100%. General: pleasant, WD, WN male who is laying in bed in NAD HEENT: head is normocephalic, atraumatic.  Sclera are noninjected.  Ears and nose without any masses or lesions.  Mouth is pink and moist Heart: regular, rate, and rhythm.  Normal s1,s2. No obvious murmurs, gallops, or rubs noted.  Palpable radial and pedal pulses bilaterally Lungs: CTAB, no wheezes, rhonchi, or rales noted.  Respiratory effort nonlabored Abd: soft, mild generalized TTP without peritonitis, ND, chronic hernia that remains soft  MS: all 4 extremities are symmetrical with no cyanosis, clubbing, or edema. Skin: warm and dry with no masses, lesions, or rashes Neuro: Cranial nerves 2-12 grossly intact, sensation is normal throughout Psych: A&Ox3 with an appropriate affect.   Results for orders placed or performed during the hospital encounter of 07/28/24 (from the past 48 hours)  Lipase, blood     Status: None   Collection Time: 07/28/24 11:25 AM  Result Value Ref Range   Lipase 13 11 - 51 U/L    Comment: Performed at [facility], [address], [place], [place] [address]   Comprehensive metabolic panel     Status: Abnormal   Collection Time: 07/28/24 11:25 AM  Result Value Ref Range   Sodium 133 (L) 135 - 145 mmol/L   Potassium 3.9 3.5 - 5.1 mmol/L   Chloride 93 (L) 98 - 111 mmol/L   CO2 28 22 - 32 mmol/L   Glucose, Bld 141 (H) 70 - 99 mg/dL    Comment: Glucose reference range applies only to samples taken after fasting for at least 8 hours.   BUN 7 (L) 8 - 23 mg/dL   Creatinine, Ser 8.95 0.61 - 1.24 mg/dL   Calcium  9.3 8.9 - 10.3 mg/dL   Total Protein 7.4  6.5 - 8.1 g/dL   Albumin 4.0 3.5 - 5.0 g/dL   AST 20 15 - 41 U/L   ALT 15 0 - 44 U/L   Alkaline Phosphatase 85 38 - 126 U/L   Total Bilirubin 0.8 0.0 - 1.2 mg/dL   GFR, Estimated >39 >39 mL/min    Comment: (NOTE) Calculated using the CKD-EPI Creatinine Equation (2021)    Anion gap 12 5 - 15    Comment: Performed at [facility], [address], [place], [place] [address]  CBC     Status: Abnormal   Collection Time: 07/28/24 11:25 AM  Result Value Ref Range   WBC 10.2 4.0 - 10.5 K/uL   RBC 5.75 4.22 - 5.81 MIL/uL   Hemoglobin 17.9 (H) 13.0 - 17.0 g/dL   HCT 48.9 60.9 - 47.9 %   MCV 88.7 80.0 - 100.0 fL   MCH 31.1 26.0 - 34.0 pg   MCHC 35.1 30.0 - 36.0 g/dL   RDW 87.1 88.4 - 84.4 %   Platelets 198 150 - 400 K/uL   nRBC 0.0 0.0 - 0.2 %    Comment: Performed at [facility], [address] [place], [place] [address]  I-stat chem 8, ED (not at [facility], [facility] or [facility])     Status: Abnormal   Collection Time: 07/28/24 12:03 PM  Result Value Ref Range   Sodium 132 (L) 135 - 145 mmol/L   Potassium 4.2 3.5 - 5.1 mmol/L   Chloride 94 (L) 98 - 111 mmol/L   BUN 8 8 - 23 mg/dL   Creatinine, Ser 8.99 0.61 - 1.24 mg/dL   Glucose, Bld 852 (H) 70 - 99 mg/dL    Comment: Glucose reference range applies only to samples taken after fasting for at least 8 hours.   Calcium , Ion 1.10 (L) 1.15 - 1.40 mmol/L   TCO2 27 22 - 32 mmol/L   Hemoglobin 17.7 (H) 13.0 - 17.0 g/dL   HCT 47.9 60.9 - 47.9 %   Urinalysis, Routine w reflex microscopic -Urine, Clean Catch     Status: Abnormal   Collection Time: 07/28/24  1:15 PM  Result Value Ref Range   Color, Urine YELLOW YELLOW   APPearance HAZY (A) CLEAR   Specific Gravity, Urine 1.013 1.005 - 1.030   pH 7.0 5.0 - 8.0   Glucose, UA NEGATIVE NEGATIVE mg/dL   Hgb urine dipstick NEGATIVE NEGATIVE   Bilirubin Urine NEGATIVE NEGATIVE   Ketones, ur NEGATIVE NEGATIVE mg/dL   Protein, ur 30 (A) NEGATIVE mg/dL   Nitrite NEGATIVE NEGATIVE   Leukocytes,Ua SMALL (A) NEGATIVE   RBC / HPF 0-5 0 - 5 RBC/hpf   WBC, UA 6-10 0 - 5 WBC/hpf   Bacteria, UA RARE (A) NONE SEEN   Squamous Epithelial / HPF 0-5 0 - 5 /HPF   Mucus PRESENT    Amorphous Crystal PRESENT    Ca Oxalate Crys, UA PRESENT     Comment: Performed at [facility], [address], [place], [place] [address]   CT ABDOMEN PELVIS W CONTRAST Result Date: 07/28/2024 CLINICAL DATA:  Bowel obstruction suspected EXAM: CT ABDOMEN AND PELVIS WITH CONTRAST TECHNIQUE: Multidetector CT imaging of the abdomen and pelvis was performed using the standard protocol following bolus administration of intravenous contrast. RADIATION DOSE REDUCTION: This exam was performed according to the departmental dose-optimization program which includes automated exposure control, adjustment of the mA and/or kV according to patient size and/or use of iterative reconstruction technique.  CONTRAST:  75mL OMNIPAQUE  IOHEXOL  350 MG/ML SOLN COMPARISON:  05/15/2021 FINDINGS: Lower chest: Motion artifact limits evaluation. Fine interstitial reticulonodular changes with peribronchial distribution possibly representing respiratory bronchiolitis. Hepatobiliary: No focal liver abnormality is seen. Status post cholecystectomy. No biliary dilatation. Pancreas: Fatty infiltration the pancreas. No focal lesion or collection identified. Spleen: Normal in size without focal abnormality. Adrenals/Urinary Tract: No adrenal gland nodules. 2 mm  stone in the upper pole left kidney. No hydronephrosis or hydroureter. Subcentimeter renal cysts. No imaging follow-up is indicated. Bladder wall is mildly thickened possibly due to under distension or outlet obstruction. Correlate with urinalysis if cystitis is suspected. Stomach/Bowel: Stomach is decompressed. Surgical clips around the stomach and at the EG junction. Broad-based anterior abdominal wall hernia with scarring in the anterior abdominal wall. The hernia contains a portion of the transverse colon as well as several loops of small bowel. Small bowel are mildly dilated and fluid-filled with transition zone in the mid abdomen, likely at the level of the hernia. Terminal ileum is decompressed. This appearance is consistent with bowel obstruction. Mild mesenteric edema. No bowel wall thickening or pneumatosis. Scattered stool throughout the colon without colonic distention. Diverticulosis of the sigmoid colon. Mild stranding around the sigmoid colon possibly representing early acute diverticulitis although more likely related to the mesenteric edema. No loculated collections. Appendix is normal. Vascular/Lymphatic: Aortic atherosclerosis. No enlarged abdominal or pelvic lymph nodes. Reproductive: Prostate gland is enlarged. Other: Scarring in the anterior abdominal wall, likely postoperative. Small amount of free fluid in the abdomen is likely reactive. No free air. Musculoskeletal: Degenerative changes in the spine. No destructive bone lesions. IMPRESSION: 1. Broad-based ventral abdominal wall hernia containing small bowel and transverse colon. 2. Dilated fluid-filled small bowel with transition zone in the mid abdomen and decompressed terminal ileum suggesting small bowel obstruction. Transition zone likely involves the hernia. 3. Mild mesenteric edema and free fluid without loculation. No bowel pneumatosis or wall thickening. 4. Colonic diverticulosis without definitive evidence of diverticulitis. Mild  pericolonic infiltration is likely related mesenteric edema. 5. Aortic atherosclerosis. 6. Nonobstructing renal stone. 7. Bladder wall thickening may be due to outlet obstruction or under distention. 8. Enlarged prostate gland. 9. Peribronchial changes in the lung bases may represent respiratory bronchiolitis. Electronically Signed   By: Elsie Gravely M.D.   On: 07/28/2024 15:39   Assessment/Plan SBO - CT w/ above - HDS without fever, tachycardia or hypotension. No peritonitis on exam. WBC 10.2. No current indication for emergency surgery - Place NGT for decompression and keep NPO - Start SBO protocol - Keep K >=4, Phos >= 3, Mg >= 2 and mobilize for bowel function - Hopefully patient will improve with conservative management. If patient fails to improve with conservative management, they may require exploratory surgery during admission - Agree with medical admission. We will follow with you.     FEN - NPO, NGT to LIWS once placed, IVF per admitting  VTE - ok to have SQH or LMWH from surgery standpoint  ID - No current indication for abx from a surgical standpoint  I reviewed ED provider notes, last 24 h vitals and pain scores, last 48 h intake and output, last 24 h labs and trends, and last 24 h imaging results.  Burnard JONELLE Louder, [facility] 07/28/2024, 4:32 PM Please see Amion for pager number during day hours 7:00am-4:30pm or 7:00am -11:30am on weekends

## 2024-07-28 NOTE — ED Triage Notes (Signed)
 C/O abd pain and back pain. Last BM was yesterday morning. C/O n/v.

## 2024-07-28 NOTE — ED Provider Notes (Signed)
 [place] EMERGENCY DEPARTMENT AT [facility] Provider Note   CSN: [id number] Arrival date & time: 07/28/24  1108     Patient presents with: Abdominal Pain   [name] is a 73 y.o. male.  Abdominal Pain Associated symptoms: nausea and vomiting   Patient is a 73 year old presented to ED today with acute onset generalized abdominal pain, worse in the left upper quadrant, accompanied with persistent nausea, vomiting with p.o. intake.  Notes this feels similar to his previous bowel obstruction in the past, noting 4 previous obstructions.  Taking MiraLAX  regularly, with last bowel movement this morning noting a small bowel movement however has not been able to pass any flatulence or bowel movement since.  Reports that emesis has been bilious.  Previous history of HTN, HLD, IBS, trigeminal neuralgia, arthritis, GERD, SBO  Denies fever, headache, vision changes, chest pain, shortness of breath, hematemesis, cough, congestion, dysuria, hematochezia, melena, lower leg swelling.     Prior to Admission medications   Medication Sig Start Date End Date Taking? Authorizing Provider  acetaminophen  (TYLENOL ) 325 MG tablet Take 2 tablets (650 mg total) by mouth every 6 (six) hours as needed for mild pain, moderate pain or fever. 12/26/19   Vicci Burnard SAUNDERS, PA-C  ALPRAZolam  (XANAX ) 1 MG tablet TAKE 1 TABLET BY MOUTH FOUR TIMES DAILY AS NEEDED FOR ANXIETY 05/23/24   Norleen Lynwood ORN, MD  amLODipine  (NORVASC ) 2.5 MG tablet Take 1 tablet (2.5 mg total) by mouth daily. 06/19/24   Norleen Lynwood ORN, MD  carbamazepine  (TEGRETOL  XR) 100 MG 12 hr tablet Take 100-200 mg by mouth See admin instructions. 100mg  in the morning and 200mg  at night.    [provider]  cyanocobalamin  (VITAMIN B12) 1000 MCG/ML injection INJECT 1 ML every 7 days 05/23/24   Norleen Lynwood ORN, MD  dexlansoprazole  (DEXILANT ) 60 MG capsule TAKE ONE CAPSULE BY MOUTH DAILY 03/24/24   Kennedy-Smith, Colleen M, NP   esomeprazole  (NEXIUM ) 40 MG capsule Take 1 capsule (40 mg total) by mouth 2 (two) times daily before a meal. 01/10/24   Cara Elida HERO, NP  famotidine  (PEPCID ) 20 MG tablet Take 1 tablet (20 mg total) by mouth at bedtime. 05/23/24   Norleen Lynwood ORN, MD  finasteride  (PROSCAR ) 5 MG tablet Take 1 tablet (5 mg total) by mouth daily. 11/13/22   Norleen Lynwood ORN, MD  gabapentin  (NEURONTIN ) 400 MG capsule Take 400-800 mg by mouth See admin instructions. Taking 400mg  at 0800, 1400, then 2 capsules (800mg ) at bedtime. 06/16/20   [provider]  hydrochlorothiazide  (MICROZIDE ) 12.5 MG capsule Take 1 capsule (12.5 mg total) by mouth daily. 06/11/24   Norleen Lynwood ORN, MD  losartan  (COZAAR ) 50 MG tablet Take 1 tablet (50 mg total) by mouth daily. 06/19/24   Norleen Lynwood ORN, MD  polyethylene glycol powder (GLYCOLAX /MIRALAX ) powder DISSOLVE 1 CAPFUL IN LIQUID EVERY DAY AS NEEDED FOR CONSTIPATION 02/18/14   Luke Chiquita SAUNDERS, DO  Probiotic Product (ALIGN) 4 MG CAPS Take 4 mg by mouth daily.    [provider]  sucralfate  (CARAFATE ) 1 g tablet TAKE 1 TABLET BY MOUTH FOUR TIMES DAILY WITH meals AND AT BEDTIME (DO NOT TAKE WITHIN 4 HOURS OF ANY OTHER MEDICATIONS) Please keep your Oct 7th appointment for further refills 06/30/24   Cara Elida HERO, NP  tamsulosin  (FLOMAX ) 0.4 MG CAPS capsule Take 0.4 mg by mouth at bedtime. 06/21/20   [provider]    Allergies: Dilantin  [phenytoin ], Dilaudid  [hydromorphone  hcl], Sulfa antibiotics,  Morphine , Ace inhibitors, Augmentin [amoxicillin -pot clavulanate], and Codeine    Review of Systems  Gastrointestinal:  Positive for abdominal pain, nausea and vomiting.  All other systems reviewed and are negative.   Updated Vital Signs BP 139/77 (BP Location: Right Arm)   Pulse 90   Temp 97.6 F (36.4 C)   Resp 20   SpO2 100%   Physical Exam Vitals and nursing note reviewed.  Constitutional:      General: He is not in acute distress.    Appearance:  Normal appearance. He is not ill-appearing or diaphoretic.  HENT:     Head: Normocephalic and atraumatic.  Eyes:     General: No scleral icterus.       Right eye: No discharge.        Left eye: No discharge.     Extraocular Movements: Extraocular movements intact.     Conjunctiva/sclera: Conjunctivae normal.  Cardiovascular:     Rate and Rhythm: Normal rate and regular rhythm.     Pulses: Normal pulses.     Heart sounds: Normal heart sounds. No murmur heard.    No friction rub. No gallop.  Pulmonary:     Effort: Pulmonary effort is normal. No respiratory distress.     Breath sounds: No stridor. No wheezing, rhonchi or rales.  Chest:     Chest wall: No tenderness.  Abdominal:     General: A surgical scar is present. Bowel sounds are increased.     Palpations: Abdomen is soft.     Tenderness: There is generalized abdominal tenderness and tenderness in the left upper quadrant. There is no right CVA tenderness, left CVA tenderness, guarding or rebound.     Hernia: A hernia is present. Hernia is present in the ventral area.  Musculoskeletal:        General: No swelling, deformity or signs of injury.     Cervical back: Normal range of motion. No rigidity.     Right lower leg: No edema.     Left lower leg: No edema.  Skin:    General: Skin is warm and dry.     Findings: No bruising, erythema or lesion.  Neurological:     General: No focal deficit present.     Mental Status: He is alert and oriented to person, place, and time. Mental status is at baseline.     Sensory: No sensory deficit.     Motor: No weakness.  Psychiatric:        Mood and Affect: Mood normal.     (all labs ordered are listed, but only abnormal results are displayed) Labs Reviewed  COMPREHENSIVE METABOLIC PANEL WITH GFR - Abnormal; Notable for the following components:      Result Value   Sodium 133 (*)    Chloride 93 (*)    Glucose, Bld 141 (*)    BUN 7 (*)    All other components within normal limits   CBC - Abnormal; Notable for the following components:   Hemoglobin 17.9 (*)    All other components within normal limits  URINALYSIS, ROUTINE W REFLEX MICROSCOPIC - Abnormal; Notable for the following components:   APPearance HAZY (*)    Protein, ur 30 (*)    Leukocytes,Ua SMALL (*)    Bacteria, UA RARE (*)    All other components within normal limits  I-STAT CHEM 8, ED - Abnormal; Notable for the following components:   Sodium 132 (*)    Chloride 94 (*)    Glucose, Bld  147 (*)    Calcium , Ion 1.10 (*)    Hemoglobin 17.7 (*)    All other components within normal limits  LIPASE, BLOOD    EKG: None  Radiology: CT ABDOMEN PELVIS W CONTRAST Result Date: 07/28/2024 CLINICAL DATA:  Bowel obstruction suspected EXAM: CT ABDOMEN AND PELVIS WITH CONTRAST TECHNIQUE: Multidetector CT imaging of the abdomen and pelvis was performed using the standard protocol following bolus administration of intravenous contrast. RADIATION DOSE REDUCTION: This exam was performed according to the departmental dose-optimization program which includes automated exposure control, adjustment of the mA and/or kV according to patient size and/or use of iterative reconstruction technique. CONTRAST:  75mL OMNIPAQUE  IOHEXOL  350 MG/ML SOLN COMPARISON:  05/15/2021 FINDINGS: Lower chest: Motion artifact limits evaluation. Fine interstitial reticulonodular changes with peribronchial distribution possibly representing respiratory bronchiolitis. Hepatobiliary: No focal liver abnormality is seen. Status post cholecystectomy. No biliary dilatation. Pancreas: Fatty infiltration the pancreas. No focal lesion or collection identified. Spleen: Normal in size without focal abnormality. Adrenals/Urinary Tract: No adrenal gland nodules. 2 mm stone in the upper pole left kidney. No hydronephrosis or hydroureter. Subcentimeter renal cysts. No imaging follow-up is indicated. Bladder wall is mildly thickened possibly due to under distension or  outlet obstruction. Correlate with urinalysis if cystitis is suspected. Stomach/Bowel: Stomach is decompressed. Surgical clips around the stomach and at the EG junction. Broad-based anterior abdominal wall hernia with scarring in the anterior abdominal wall. The hernia contains a portion of the transverse colon as well as several loops of small bowel. Small bowel are mildly dilated and fluid-filled with transition zone in the mid abdomen, likely at the level of the hernia. Terminal ileum is decompressed. This appearance is consistent with bowel obstruction. Mild mesenteric edema. No bowel wall thickening or pneumatosis. Scattered stool throughout the colon without colonic distention. Diverticulosis of the sigmoid colon. Mild stranding around the sigmoid colon possibly representing early acute diverticulitis although more likely related to the mesenteric edema. No loculated collections. Appendix is normal. Vascular/Lymphatic: Aortic atherosclerosis. No enlarged abdominal or pelvic lymph nodes. Reproductive: Prostate gland is enlarged. Other: Scarring in the anterior abdominal wall, likely postoperative. Small amount of free fluid in the abdomen is likely reactive. No free air. Musculoskeletal: Degenerative changes in the spine. No destructive bone lesions. IMPRESSION: 1. Broad-based ventral abdominal wall hernia containing small bowel and transverse colon. 2. Dilated fluid-filled small bowel with transition zone in the mid abdomen and decompressed terminal ileum suggesting small bowel obstruction. Transition zone likely involves the hernia. 3. Mild mesenteric edema and free fluid without loculation. No bowel pneumatosis or wall thickening. 4. Colonic diverticulosis without definitive evidence of diverticulitis. Mild pericolonic infiltration is likely related mesenteric edema. 5. Aortic atherosclerosis. 6. Nonobstructing renal stone. 7. Bladder wall thickening may be due to outlet obstruction or under distention. 8.  Enlarged prostate gland. 9. Peribronchial changes in the lung bases may represent respiratory bronchiolitis. Electronically Signed   By: Elsie Gravely M.D.   On: 07/28/2024 15:39   Procedures   Medications Ordered in the ED  ondansetron  (ZOFRAN ) injection 4 mg (has no administration in time range)  sodium chloride  0.9 % bolus 500 mL (has no administration in time range)  ondansetron  (ZOFRAN ) injection 4 mg (4 mg Intravenous Given 07/28/24 1136)  iohexol  (OMNIPAQUE ) 350 MG/ML injection 75 mL (75 mLs Intravenous Contrast Given 07/28/24 1512)    Clinical Course as of 07/28/24 1627  Mon Jul 28, 2024  1602 Spoke with Burnard Louder on the general surgery team who wished for the  patient to be admitted to medicine, no need for NG tube at this time and will come and see the patient. [CB]    Clinical Course User Index [CB] Beola Terrall RAMAN, [place]                               Medical Decision Making  This patient is a 73 year old male who presents to the ED for concern of abdominal pain, bilious vomiting and nausea that has been present acutely since last night, similar to previous bowel obstructions with last obstruction being in 2022.  On physical exam, patient is in no acute distress, afebrile, alert and orient x 4, speaking in full sentences, nontachypneic, nontachycardic.  Ventral hernia present, with hyperactive bowel sounds, general abdominal pain, worse in left upper quadrant.  Exam is otherwise unremarkable.  Lab work was unremarkable however CT scan did show possible SBO as well as bladder wall thickening.  Spoke with general surgery who recommended admit to medicine and will come see the patient, no need for further imaging at this time with patient not vomiting with Zofran .  Patient care transferred to Dr. Tobie with hospitalist.  Differential diagnoses prior to evaluation: The emergent differential diagnosis includes, but is not limited to, SBO, incarcerated hernia, ileus,  gastroenteritis, nephrolithiasis, pyelonephritis, mesenteric ischemia, UTI, pancreatitis, hepatitis, cholecystitis. This is not an exhaustive differential.   Past Medical History / Co-morbidities / Social History: HTN, HLD, IBS, trigeminal neuralgia, arthritis, GERD, SBO  Additional history: Chart reviewed. Pertinent results include:  Last seen by PCP on 07/10/24  Last seen for bowel obstruction on 05/15/2021, general surgery consulted at that time for early SBO versus ileus versus enteritis.  No NG tube was placed and patient was observed overnight tolerating diet advancement and cleared to go home.  Lab Tests/Imaging studies: I personally interpreted labs/imaging and the pertinent results include:    CBC notes an elevated hemoglobin 17.9 but otherwise unremarkable CMP notes a mild hyponatremia of 133 but otherwise unremarkable UA shows rare bacteria and small leukocytes but otherwise unremarkable.  CT scan shows ventral hernia containing small bowel and transverse colon, dilated fluid-filled small bowel with transition zone in the mid abdomen decompressing terminal ileum suggesting SBO.  Bladder wall thickening also noted possible due to outlet obstruction or distention.  I agree with the radiologist interpretation.   Medications: I ordered medication including Zofran , NS.  I have reviewed the patients home medicines and have made adjustments as needed.  Critical Interventions:  Social Determinants of Health:  Disposition: After consideration of the diagnostic results and the patients response to treatment, I feel that the patient would benefit from admission with patient care transferred over to Dr. Tobie, patient going to be seen by general surgery team.   Final diagnoses:  SBO (small bowel obstruction) [facility])    ED Discharge Orders     None          Beola Terrall RAMAN, PA-C 07/28/24 1702    Pamella Ozell LABOR, DO 08/03/24 1954

## 2024-07-28 NOTE — H&P (Signed)
 History and Physical    Patient: [name] DOB: [birth year] DOA: 07/28/2024 DOS: the patient was seen and examined on 07/28/2024 . PCP: Norleen Lynwood ORN, MD  Patient coming from: Home Chief complaint: Chief Complaint  Patient presents with   Abdominal Pain   HPI:  [name] is a 73 y.o. male with past medical history  of allergies to Dilantin , Dilaudid , morphine , sulfa, ACE inhibitor, Augmentin, codeine, obstructive chronic bronchitis, BPH, essential hypertension, GERD, trigeminal neuralgia, anxiety and depression, hiatal hernia,, anemia, history of pneumonia, enteritis, small bowel obstruction x 4 episodes in the past 3 years, B12 deficiency, hyperlipidemia, orthostatic dizziness, history of sinus infections,--comes today with nausea vomiting and abdominal pain that started earlier in the morning today.  Patient describes that it when it happens it usually happens subacute onset the vomitus was bilious nonbloody.  Discussed with patient about CT findings about bronchiolitis and no reports of shortness of breath or cough, also for concerns of bladder infection.  Patient denies any dysuria hematuria.   ED Course:  Vital signs in the ED were notable for the following:  Vitals:   07/28/24 1113 07/28/24 1654 07/28/24 1827 07/28/24 1835  BP: 139/77  (!) 147/97   Pulse: 90  96   Temp: 97.6 F (36.4 C) 97.8 F (36.6 C) 98.5 F (36.9 C)   Resp: 20  18   Height:   6' (1.829 m) 6' (1.829 m)  Weight:    104.3 kg  SpO2: 100%     TempSrc:  Oral Oral   BMI (Calculated):    31.19   >>ED evaluation thus far shows: -CMP shows sodium 133 chloride 93 glucose 141 normal kidney function, normal LFTs. - CBC is within normal limits, hemoglobin is 17.9 patient is Hemoconcentrated and dehydrated from nausea and vomiting. - Urinalysis shows 6-10 WBCs small leukocytes hazy urine. - Abnormal CT with ventral abdominal hernia and dilated fluid-filled small bowel with  transition zone in midabdomen decompressed terminal ileum suggestive of small bowel obstruction.  See complete report.  >>While in the ED patient received the following: Patient received Zofran  and half a liter normal saline bolus.  Review of Systems  Gastrointestinal:  Positive for abdominal pain, nausea and vomiting.  All other systems reviewed and are negative.  Past Medical History:  Diagnosis Date   Allergic rhinitis    Anemia, pernicious    Anxiety and depression    sees Dr. Vincente   Arthritis    B12 deficiency 01/23/2020   Catheter-associated urinary tract infection    Diverticulosis of colon    Elevated PSA    and hypogonadism, sees urologist   GERD (gastroesophageal reflux disease)    hiatal hernia   Hx of blood transfusion reaction 1999   Hyperlipidemia    Hyperparathyroidism    Hyperplastic colon polyp    Hypertension    acei causes cough   IBS (irritable bowel syndrome)    Nephrolithiasis    Obstructive chronic bronchitis without exacerbation, followed by Dr. Brien 03/04/2009   ONO RA  Normal 07/28/11 6 min walk >563m no desat PFTs 08/08/11: DLCO 70% TLC 80%  FeV1 80%   Fef 25 75 56% with significant improvement after BD    Peritonitis (HCC)    after surgery in 1999   Pneumonia    PONV (postoperative nausea and vomiting)    Rectal fissure    Trigeminal neuralgia    has facial asymetry   Ventral hernia    Past Surgical History:  Procedure Laterality Date   ABDOMINAL SURGERY     Multiple   BRAIN SURGERY  01/2011   Trigeminal nerve/Dr Unice   CHOLECYSTECTOMY  1981   DENTAL SURGERY  2019   tooth extraction, root canal   ESOPHAGEAL MANOMETRY N/A 04/28/2020   Procedure: ESOPHAGEAL MANOMETRY (EM);  Surgeon: Shila Gustav GAILS, MD;  Location: [facility];  Service: Endoscopy;  Laterality: N/A;   HAND SURGERY  1973   Left   HERNIA REPAIR  1999   KNEE SURGERY  1997   Left   LITHOTRIPSY     Right   NISSEN FUNDOPLICATION  1999    complicated by gastric  perforation, peritonitis, ventral nernia    RHIZOTOMY Right 11/17/2014   Procedure: RHIZOTOMY TO RIGHT V2,V3;  Surgeon: Fairy Unice, MD;  Location: [facility];  Service: Neurosurgery;  Laterality: Right;  right   RHIZOTOMY Right 12/22/2014   Procedure: Right V2 and V3 trigeminal rhysolysis;  Surgeon: Fairy Unice, MD;  Location: [facility];  Service: Neurosurgery;  Laterality: Right;  Right V2 and V3 trigeminal rhysolysis   RHIZOTOMY W/ RADIOFREQUENCY ABLATION  08/2017   Dr Mindi   UPPER GASTROINTESTINAL ENDOSCOPY     VENTRAL HERNIA REPAIR  2004    reports that he quit smoking about 27 years ago. His smoking use included cigarettes. He started smoking about 47 years ago. He has a 20 pack-year smoking history. He quit smokeless tobacco use about 40 years ago.  His smokeless tobacco use included chew. He reports that he does not drink alcohol and does not use drugs. Allergies  Allergen Reactions   Dilantin  [Phenytoin ] Other (See Comments)    bradycardia   Dilaudid  [Hydromorphone  Hcl] Nausea And Vomiting   Morphine  Nausea And Vomiting   Sulfa Antibiotics Hives   Ace Inhibitors Cough   Augmentin [Amoxicillin -Pot Clavulanate] Nausea And Vomiting   Codeine Nausea And Vomiting   Family History  Problem Relation Age of Onset   Colon polyps Mother    Diabetes Mother    Coronary artery disease Mother        CABG   Diabetes Sister    Diabetes Brother    Hypertension Brother    Heart disease Brother    Heart disease Brother    Colon cancer Neg Hx    Esophageal cancer Neg Hx    Stomach cancer Neg Hx    Pancreatic cancer Neg Hx    Prior to Admission medications   Medication Sig Start Date End Date Taking? Authorizing Provider  acetaminophen  (TYLENOL ) 325 MG tablet Take 2 tablets (650 mg total) by mouth every 6 (six) hours as needed for mild pain, moderate pain or fever. 12/26/19   Vicci Burnard SAUNDERS, PA-C  ALPRAZolam  (XANAX ) 1 MG tablet TAKE 1 TABLET BY MOUTH FOUR TIMES DAILY AS NEEDED  FOR ANXIETY 05/23/24   Norleen Lynwood ORN, MD  amLODipine  (NORVASC ) 2.5 MG tablet Take 1 tablet (2.5 mg total) by mouth daily. 06/19/24   Norleen Lynwood ORN, MD  carbamazepine  (TEGRETOL  XR) 100 MG 12 hr tablet Take 100-200 mg by mouth See admin instructions. 100mg  in the morning and 200mg  at night.    [provider]  cyanocobalamin  (VITAMIN B12) 1000 MCG/ML injection INJECT 1 ML every 7 days 05/23/24   Norleen Lynwood ORN, MD  dexlansoprazole  (DEXILANT ) 60 MG capsule TAKE ONE CAPSULE BY MOUTH DAILY 03/24/24   Kennedy-Smith, Colleen M, NP  esomeprazole  (NEXIUM ) 40 MG capsule Take 1 capsule (40 mg total) by mouth 2 (two) times  daily before a meal. 01/10/24   Kennedy-Smith, Colleen M, NP  famotidine  (PEPCID ) 20 MG tablet Take 1 tablet (20 mg total) by mouth at bedtime. 05/23/24   Norleen Lynwood ORN, MD  finasteride  (PROSCAR ) 5 MG tablet Take 1 tablet (5 mg total) by mouth daily. 11/13/22   Norleen Lynwood ORN, MD  gabapentin  (NEURONTIN ) 400 MG capsule Take 400-800 mg by mouth See admin instructions. Taking 400mg  at 0800, 1400, then 2 capsules (800mg ) at bedtime. 06/16/20   [provider]  hydrochlorothiazide  (MICROZIDE ) 12.5 MG capsule Take 1 capsule (12.5 mg total) by mouth daily. 06/11/24   Norleen Lynwood ORN, MD  losartan  (COZAAR ) 50 MG tablet Take 1 tablet (50 mg total) by mouth daily. 06/19/24   Norleen Lynwood ORN, MD  polyethylene glycol powder (GLYCOLAX /MIRALAX ) powder DISSOLVE 1 CAPFUL IN LIQUID EVERY DAY AS NEEDED FOR CONSTIPATION 02/18/14   Luke Chiquita SAUNDERS, DO  Probiotic Product (ALIGN) 4 MG CAPS Take 4 mg by mouth daily.    [provider]  sucralfate  (CARAFATE ) 1 g tablet TAKE 1 TABLET BY MOUTH FOUR TIMES DAILY WITH meals AND AT BEDTIME (DO NOT TAKE WITHIN 4 HOURS OF ANY OTHER MEDICATIONS) Please keep your Oct 7th appointment for further refills 06/30/24   Cara Elida HERO, NP  tamsulosin  (FLOMAX ) 0.4 MG CAPS capsule Take 0.4 mg by mouth at bedtime. 06/21/20   [provider]                                                                                  Vitals:   07/28/24 1113 07/28/24 1654 07/28/24 1827 07/28/24 1835  BP: 139/77  (!) 147/97   Pulse: 90  96   Resp: 20  18   Temp: 97.6 F (36.4 C) 97.8 F (36.6 C) 98.5 F (36.9 C)   TempSrc:  Oral Oral   SpO2: 100%     Weight:    104.3 kg  Height:   6' (1.829 m) 6' (1.829 m)   Physical Exam Vitals reviewed.  Constitutional:      General: He is not in acute distress.    Appearance: He is not ill-appearing.  HENT:     Head: Normocephalic.  Eyes:     Extraocular Movements: Extraocular movements intact.  Cardiovascular:     Rate and Rhythm: Normal rate and regular rhythm.     Heart sounds: Normal heart sounds.  Pulmonary:     Breath sounds: Normal breath sounds.  Abdominal:     General: There is no distension.     Palpations: Abdomen is soft.     Tenderness: There is no abdominal tenderness.  Neurological:     General: No focal deficit present.     Mental Status: He is alert and oriented to person, place, and time.     Labs on Admission: I have personally reviewed following labs and imaging studies CBC: Recent Labs  Lab 07/28/24 1125 07/28/24 1203  WBC 10.2  --   HGB 17.9* 17.7*  HCT 51.0 52.0  MCV 88.7  --   PLT 198  --    Basic Metabolic Panel: Recent Labs  Lab 07/28/24 1125 07/28/24 1153 07/28/24 1203  NA 133*  --  132*  K 3.9  --  4.2  CL 93*  --  94*  CO2 28  --   --   GLUCOSE 141*  --  147*  BUN 7*  --  8  CREATININE 1.04  --  1.00  CALCIUM  9.3  --   --   MG  --  1.9  --    GFR: Estimated Creatinine Clearance: 83.4 mL/min (by C-G formula based on SCr of 1 mg/dL). Liver Function Tests: Recent Labs  Lab 07/28/24 1125  AST 20  ALT 15  ALKPHOS 85  BILITOT 0.8  PROT 7.4  ALBUMIN 4.0   Recent Labs  Lab 07/28/24 1125  LIPASE 13   No results for input(s): AMMONIA in the last 168 hours. Recent Labs    12/07/23 1106 12/28/23 1237 05/23/24 1053 07/28/24 1125 07/28/24 1203  BUN  12 10 11  7* 8  CREATININE 0.85 0.81 0.78 1.04 1.00    Cardiac Enzymes: No results for input(s): CKTOTAL, CKMB, CKMBINDEX, TROPONINI in the last 168 hours. BNP (last 3 results) No results for input(s): PROBNP in the last 8760 hours. HbA1C: No results for input(s): HGBA1C in the last 72 hours. CBG: No results for input(s): GLUCAP in the last 168 hours. Lipid Profile: No results for input(s): CHOL, HDL, LDLCALC, TRIG, CHOLHDL, LDLDIRECT in the last 72 hours. Thyroid  Function Tests: No results for input(s): TSH, T4TOTAL, FREET4, T3FREE, THYROIDAB in the last 72 hours. Anemia Panel: No results for input(s): VITAMINB12, FOLATE, FERRITIN, TIBC, IRON, RETICCTPCT in the last 72 hours. Urine analysis:    Component Value Date/Time   COLORURINE YELLOW 07/28/2024 1315   APPEARANCEUR HAZY (A) 07/28/2024 1315   LABSPEC 1.013 07/28/2024 1315   PHURINE 7.0 07/28/2024 1315   GLUCOSEU NEGATIVE 07/28/2024 1315   GLUCOSEU NEGATIVE 12/07/2023 1106   HGBUR NEGATIVE 07/28/2024 1315   BILIRUBINUR NEGATIVE 07/28/2024 1315   BILIRUBINUR negative 05/11/2011 1528   KETONESUR NEGATIVE 07/28/2024 1315   PROTEINUR 30 (A) 07/28/2024 1315   UROBILINOGEN 0.2 12/07/2023 1106   NITRITE NEGATIVE 07/28/2024 1315   LEUKOCYTESUR SMALL (A) 07/28/2024 1315   Radiological Exams on Admission: DG Abd Portable 1V-Small Bowel Protocol-Position Verification Result Date: 07/28/2024 CLINICAL DATA:  Confirm NG tube placement EXAM: PORTABLE ABDOMEN - 1 VIEW COMPARISON:  CT abdomen and pelvis 07/28/2024 FINDINGS: Limited field of view for tube placement verification purposes. An enteric tube is present with tip projecting over the mid abdomen consistent with location in the distal stomach. Surgical clips in the upper abdomen. Residual contrast material in the kidneys. Mildly gas distended upper abdominal small bowel may indicate obstruction. Linear atelectasis in the left lung base.  Degenerative changes in the spine. IMPRESSION: Enteric tube tip projects over the mid abdomen consistent with location in the distal stomach. Mild gaseous distention of small bowel may indicate obstruction. Electronically Signed   By: Elsie Gravely M.D.   On: 07/28/2024 18:52   CT ABDOMEN PELVIS W CONTRAST Result Date: 07/28/2024 CLINICAL DATA:  Bowel obstruction suspected EXAM: CT ABDOMEN AND PELVIS WITH CONTRAST TECHNIQUE: Multidetector CT imaging of the abdomen and pelvis was performed using the standard protocol following bolus administration of intravenous contrast. RADIATION DOSE REDUCTION: This exam was performed according to the departmental dose-optimization program which includes automated exposure control, adjustment of the mA and/or kV according to patient size and/or use of iterative reconstruction technique. CONTRAST:  75mL OMNIPAQUE  IOHEXOL  350 MG/ML SOLN COMPARISON:  05/15/2021 FINDINGS: Lower chest: Motion artifact limits evaluation. Fine interstitial reticulonodular changes with  peribronchial distribution possibly representing respiratory bronchiolitis. Hepatobiliary: No focal liver abnormality is seen. Status post cholecystectomy. No biliary dilatation. Pancreas: Fatty infiltration the pancreas. No focal lesion or collection identified. Spleen: Normal in size without focal abnormality. Adrenals/Urinary Tract: No adrenal gland nodules. 2 mm stone in the upper pole left kidney. No hydronephrosis or hydroureter. Subcentimeter renal cysts. No imaging follow-up is indicated. Bladder wall is mildly thickened possibly due to under distension or outlet obstruction. Correlate with urinalysis if cystitis is suspected. Stomach/Bowel: Stomach is decompressed. Surgical clips around the stomach and at the EG junction. Broad-based anterior abdominal wall hernia with scarring in the anterior abdominal wall. The hernia contains a portion of the transverse colon as well as several loops of small bowel. Small  bowel are mildly dilated and fluid-filled with transition zone in the mid abdomen, likely at the level of the hernia. Terminal ileum is decompressed. This appearance is consistent with bowel obstruction. Mild mesenteric edema. No bowel wall thickening or pneumatosis. Scattered stool throughout the colon without colonic distention. Diverticulosis of the sigmoid colon. Mild stranding around the sigmoid colon possibly representing early acute diverticulitis although more likely related to the mesenteric edema. No loculated collections. Appendix is normal. Vascular/Lymphatic: Aortic atherosclerosis. No enlarged abdominal or pelvic lymph nodes. Reproductive: Prostate gland is enlarged. Other: Scarring in the anterior abdominal wall, likely postoperative. Small amount of free fluid in the abdomen is likely reactive. No free air. Musculoskeletal: Degenerative changes in the spine. No destructive bone lesions. IMPRESSION: 1. Broad-based ventral abdominal wall hernia containing small bowel and transverse colon. 2. Dilated fluid-filled small bowel with transition zone in the mid abdomen and decompressed terminal ileum suggesting small bowel obstruction. Transition zone likely involves the hernia. 3. Mild mesenteric edema and free fluid without loculation. No bowel pneumatosis or wall thickening. 4. Colonic diverticulosis without definitive evidence of diverticulitis. Mild pericolonic infiltration is likely related mesenteric edema. 5. Aortic atherosclerosis. 6. Nonobstructing renal stone. 7. Bladder wall thickening may be due to outlet obstruction or under distention. 8. Enlarged prostate gland. 9. Peribronchial changes in the lung bases may represent respiratory bronchiolitis. Electronically Signed   By: Elsie Gravely M.D.   On: 07/28/2024 15:39   Data Reviewed: Relevant notes from primary care and specialist visits, past discharge summaries as available in EHR, including Care Everywhere . Prior diagnostic testing as  pertinent to current admission diagnoses, Updated medications and problem lists for reconciliation .ED course, including vitals, labs, imaging, treatment and response to treatment,Triage notes, nursing and pharmacy notes and ED provider's notes.Notable results as noted in HPI.Discussed case with EDMD/ ED APP/ or Specialty MD on call and as needed.  Assessment & Plan  >>N/V Abd Pain/ SBO: 2/2 to SBO with transition point midabdomen in hernia.  General surgery consulted , NGT if vomiting recurs or per general surgery.  IV PPI p.o. meds after clamping NG tube.  Repeat abdominal series x-ray as deemed appropriate.  >>? Uti/BPH: Urine culture. Abx once culture resulted. Continue with patient's Flomax /finasteride .  >> Essential hypertension: Continue patient's amlodipine .  Hold patient's  diuretics, and cont ACEi.   DVT prophylaxis:  Heparin  Consults:  General surgery  Advance Care Planning:    Code Status: Full Code   Family Communication:  Wife Disposition Plan:  Home Severity of Illness: The appropriate patient status for this patient is OBSERVATION. Observation status is judged to be reasonable and necessary in order to provide the required intensity of service to ensure the patient's safety. The patient's presenting symptoms, physical exam  findings, and initial radiographic and laboratory data in the context of their medical condition is felt to place them at decreased risk for further clinical deterioration. Furthermore, it is anticipated that the patient will be medically stable for discharge from the hospital within 2 midnights of admission.   Unresulted Labs (From admission, onward)     Start     Ordered   07/29/24 0500  Comprehensive metabolic panel  Tomorrow morning,   R        07/28/24 1646   07/29/24 0500  CBC  Tomorrow morning,   R        07/28/24 1646   07/29/24 0500  Magnesium   Tomorrow morning,   R        07/28/24 1646   07/28/24 1642  Urinalysis, Complete w  Microscopic -Urine, Random  Add-on,   AD       Question:  Specimen Source  Answer:  Urine, Random   07/28/24 1643            Meds ordered this encounter  Medications   ondansetron  (ZOFRAN ) injection 4 mg   iohexol  (OMNIPAQUE ) 350 MG/ML injection 75 mL   ondansetron  (ZOFRAN ) injection 4 mg   sodium chloride  0.9 % bolus 500 mL   diatrizoate  meglumine -sodium (GASTROGRAFIN ) 66-10 % solution 90 mL   sodium chloride  flush (NS) 0.9 % injection 3 mL   0.9 %  sodium chloride  infusion   OR Linked Order Group    acetaminophen  (TYLENOL ) tablet 650 mg    acetaminophen  (TYLENOL ) suppository 650 mg   heparin  injection 5,000 Units   tamsulosin  (FLOMAX ) capsule 0.4 mg   DISCONTD: gabapentin  (NEURONTIN ) capsule 400-800 mg   DISCONTD: carbamazepine  (TEGRETOL  XR) 12 hr tablet 100-200 mg   budesonide  (PULMICORT ) nebulizer solution 0.5 mg   gabapentin  (NEURONTIN ) capsule 400 mg   gabapentin  (NEURONTIN ) capsule 800 mg   carbamazepine  (TEGRETOL  XR) 12 hr tablet 100 mg   carbamazepine  (TEGRETOL  XR) 12 hr tablet 200 mg   amLODipine  (NORVASC ) tablet 2.5 mg   ALPRAZolam  (XANAX ) tablet 0.5 mg   finasteride  (PROSCAR ) tablet 5 mg   losartan  (COZAAR ) tablet 50 mg   hydrALAZINE  (APRESOLINE ) injection 5 mg     Orders Placed This Encounter  Procedures   CT ABDOMEN PELVIS W CONTRAST   DG Abd Portable 1V-Small Bowel Protocol-Position Verification   DG Abd Portable 1V-Small Bowel Obstruction Protocol-initial, 8 hr delay   Lipase, blood   Comprehensive metabolic panel   CBC   Urinalysis, Routine w reflex microscopic -Urine, Clean Catch   Urinalysis, Complete w Microscopic -Urine, Random   Comprehensive metabolic panel   CBC   Magnesium    Magnesium    Diet NPO time specified Except for: Sips with Meds   Refer to Sidebar: Small Bowel Obstruction Protocol   Gastric tube   Elevate head of bed   Ensure patient not vomiting prior to administration of Gastrografin . Notify MD if patient is actively vomiting    Clamp NG tube for 1 hour after administration of Gastrografin  then resume NG tube to low wall intermittent suction   Bladder scan   Maintain IV access   Vital signs   Notify physician (specify)   Refer to Sidebar Report Mobility Protocol for Adult Inpatient   Initiate Adult Central Line Maintenance and Catheter Protocol for patients with central line (CVC, PICC, Port, Hemodialysis, Trialysis)   Daily weights   Intake and Output   Initiate CHG Protocol   Do not place and if present remove PureWick  Initiate Oral Care Protocol   Initiate Carrier Fluid Protocol   RN may order General Admission PRN Orders utilizing General Admission PRN medications (through manage orders) for the following patient needs: allergy symptoms (Claritin ), cold sores (Carmex), cough (Robitussin DM), eye irritation (Liquifilm Tears), hemorrhoids (Tucks), indigestion (Maalox), minor skin irritation (Hydrocortisone Cream), muscle pain Lucienne Gay), nose irritation (saline nasal spray) and sore throat (Chloraseptic spray).   Cardiac Monitoring Continuous x 48 hours Indications for use: Other; Other indications for use: Electrolyte monitoring.   Ambulate with assistance   Full code   Consult to general surgery   Consult to hospitalist   Pulse oximetry check with vital signs   Oxygen therapy Mode or (Route): Nasal cannula; Liters Per Minute: 2; Keep O2 saturation between: greater than 92 %   Incentive spirometry RT   I-stat chem 8, ED (not at [facility], [facility] or [facility])   Place in observation (patient's expected length of stay will be less than 2 midnights)   Aspiration precautions   Fall precautions    Author: Mario LULLA Blanch, MD 12 pm- 8 pm. [facility]. 07/28/2024 7:12 PM Please note for any communication after hours contact [facility] Assigned provider on call on Amion.

## 2024-07-28 NOTE — ED Triage Notes (Signed)
 Pt c.o generalized abd pain and lower back pain for a few days. Hx of bowel obstructions, last Bm yesterday. Pt states he is not passing any gas. Pt dry heaving in triage

## 2024-07-28 NOTE — ED Provider Triage Note (Signed)
 Emergency Medicine Provider Triage Evaluation Note  [name] , a 73 y.o. male  was evaluated in triage.  Pt complains of abdominal pain and vomiting.  Vomiting is bilious in nature.  Patient has a history of bowel obstructions due to adhesions from previous surgery.  Patient reports constipation.  Symptoms are consistent with previous bowel obstructions.  Review of Systems  Positive: Vomiting and abdominal pain Negative: Fever  Physical Exam  BP 139/77 (BP Location: Right Arm)   Pulse 90   Temp 97.6 F (36.4 C)   Resp 20   SpO2 100%  Gen:   Awake, no distress   Resp:  Normal effort  MSK:   Moves extremities without difficulty  Other:  Bilateral abdominal pain and tenderness  Medical Decision Making  Medically screening exam initiated at 12:29 PM.  Appropriate orders placed.  [name] was informed that the remainder of the evaluation will be completed by another provider, this initial triage assessment does not replace that evaluation, and the importance of remaining in the ED until their evaluation is complete.  CT ordered.  Labs are in process.   Jeffrey Chew, PA-C 07/28/24 1230

## 2024-07-29 ENCOUNTER — Inpatient Hospital Stay (HOSPITAL_COMMUNITY)

## 2024-07-29 ENCOUNTER — Observation Stay (HOSPITAL_COMMUNITY)

## 2024-07-29 DIAGNOSIS — N401 Enlarged prostate with lower urinary tract symptoms: Secondary | ICD-10-CM | POA: Diagnosis not present

## 2024-07-29 DIAGNOSIS — R1114 Bilious vomiting: Secondary | ICD-10-CM | POA: Diagnosis not present

## 2024-07-29 DIAGNOSIS — Z885 Allergy status to narcotic agent status: Secondary | ICD-10-CM | POA: Diagnosis not present

## 2024-07-29 DIAGNOSIS — R350 Frequency of micturition: Secondary | ICD-10-CM | POA: Diagnosis not present

## 2024-07-29 DIAGNOSIS — K56600 Partial intestinal obstruction, unspecified as to cause: Secondary | ICD-10-CM | POA: Diagnosis not present

## 2024-07-29 DIAGNOSIS — K5669 Other partial intestinal obstruction: Secondary | ICD-10-CM | POA: Diagnosis not present

## 2024-07-29 DIAGNOSIS — Z882 Allergy status to sulfonamides status: Secondary | ICD-10-CM | POA: Diagnosis not present

## 2024-07-29 DIAGNOSIS — K5651 Intestinal adhesions [bands], with partial obstruction: Secondary | ICD-10-CM | POA: Diagnosis present

## 2024-07-29 DIAGNOSIS — I10 Essential (primary) hypertension: Secondary | ICD-10-CM | POA: Diagnosis not present

## 2024-07-29 DIAGNOSIS — R109 Unspecified abdominal pain: Secondary | ICD-10-CM | POA: Diagnosis present

## 2024-07-29 DIAGNOSIS — Z8701 Personal history of pneumonia (recurrent): Secondary | ICD-10-CM | POA: Diagnosis not present

## 2024-07-29 DIAGNOSIS — K439 Ventral hernia without obstruction or gangrene: Secondary | ICD-10-CM | POA: Diagnosis present

## 2024-07-29 DIAGNOSIS — Z880 Allergy status to penicillin: Secondary | ICD-10-CM | POA: Diagnosis not present

## 2024-07-29 DIAGNOSIS — Z4682 Encounter for fitting and adjustment of non-vascular catheter: Secondary | ICD-10-CM | POA: Diagnosis not present

## 2024-07-29 DIAGNOSIS — Z833 Family history of diabetes mellitus: Secondary | ICD-10-CM | POA: Diagnosis not present

## 2024-07-29 DIAGNOSIS — K56609 Unspecified intestinal obstruction, unspecified as to partial versus complete obstruction: Secondary | ICD-10-CM | POA: Diagnosis not present

## 2024-07-29 DIAGNOSIS — E871 Hypo-osmolality and hyponatremia: Secondary | ICD-10-CM | POA: Diagnosis not present

## 2024-07-29 DIAGNOSIS — Z8249 Family history of ischemic heart disease and other diseases of the circulatory system: Secondary | ICD-10-CM | POA: Diagnosis not present

## 2024-07-29 DIAGNOSIS — E785 Hyperlipidemia, unspecified: Secondary | ICD-10-CM | POA: Diagnosis present

## 2024-07-29 DIAGNOSIS — Z79899 Other long term (current) drug therapy: Secondary | ICD-10-CM | POA: Diagnosis not present

## 2024-07-29 DIAGNOSIS — K432 Incisional hernia without obstruction or gangrene: Secondary | ICD-10-CM | POA: Diagnosis present

## 2024-07-29 DIAGNOSIS — F32A Depression, unspecified: Secondary | ICD-10-CM | POA: Diagnosis present

## 2024-07-29 DIAGNOSIS — K5730 Diverticulosis of large intestine without perforation or abscess without bleeding: Secondary | ICD-10-CM | POA: Diagnosis present

## 2024-07-29 DIAGNOSIS — K219 Gastro-esophageal reflux disease without esophagitis: Secondary | ICD-10-CM | POA: Diagnosis present

## 2024-07-29 DIAGNOSIS — G500 Trigeminal neuralgia: Secondary | ICD-10-CM | POA: Diagnosis not present

## 2024-07-29 DIAGNOSIS — E538 Deficiency of other specified B group vitamins: Secondary | ICD-10-CM | POA: Diagnosis present

## 2024-07-29 DIAGNOSIS — K449 Diaphragmatic hernia without obstruction or gangrene: Secondary | ICD-10-CM | POA: Diagnosis present

## 2024-07-29 DIAGNOSIS — N400 Enlarged prostate without lower urinary tract symptoms: Secondary | ICD-10-CM | POA: Diagnosis present

## 2024-07-29 DIAGNOSIS — F419 Anxiety disorder, unspecified: Secondary | ICD-10-CM | POA: Diagnosis present

## 2024-07-29 DIAGNOSIS — R935 Abnormal findings on diagnostic imaging of other abdominal regions, including retroperitoneum: Secondary | ICD-10-CM | POA: Diagnosis not present

## 2024-07-29 DIAGNOSIS — Z888 Allergy status to other drugs, medicaments and biological substances status: Secondary | ICD-10-CM | POA: Diagnosis not present

## 2024-07-29 DIAGNOSIS — N390 Urinary tract infection, site not specified: Secondary | ICD-10-CM | POA: Diagnosis present

## 2024-07-29 DIAGNOSIS — Z87891 Personal history of nicotine dependence: Secondary | ICD-10-CM | POA: Diagnosis not present

## 2024-07-29 LAB — CBC
HCT: 49.3 % (ref 39.0–52.0)
Hemoglobin: 17.3 g/dL — ABNORMAL HIGH (ref 13.0–17.0)
MCH: 30.8 pg (ref 26.0–34.0)
MCHC: 35.1 g/dL (ref 30.0–36.0)
MCV: 87.9 fL (ref 80.0–100.0)
Platelets: 184 K/uL (ref 150–400)
RBC: 5.61 MIL/uL (ref 4.22–5.81)
RDW: 12.9 % (ref 11.5–15.5)
WBC: 10.8 K/uL — ABNORMAL HIGH (ref 4.0–10.5)
nRBC: 0 % (ref 0.0–0.2)

## 2024-07-29 LAB — COMPREHENSIVE METABOLIC PANEL WITH GFR
ALT: 14 U/L (ref 0–44)
AST: 15 U/L (ref 15–41)
Albumin: 3.6 g/dL (ref 3.5–5.0)
Alkaline Phosphatase: 77 U/L (ref 38–126)
Anion gap: 11 (ref 5–15)
BUN: 11 mg/dL (ref 8–23)
CO2: 29 mmol/L (ref 22–32)
Calcium: 9.1 mg/dL (ref 8.9–10.3)
Chloride: 93 mmol/L — ABNORMAL LOW (ref 98–111)
Creatinine, Ser: 1.11 mg/dL (ref 0.61–1.24)
GFR, Estimated: >60 mL/min (ref >60)
Glucose, Bld: 117 mg/dL — ABNORMAL HIGH (ref 70–99)
Potassium: 4.3 mmol/L (ref 3.5–5.1)
Sodium: 133 mmol/L — ABNORMAL LOW (ref 135–145)
Total Bilirubin: 0.8 mg/dL (ref 0.0–1.2)
Total Protein: 6.9 g/dL (ref 6.5–8.1)

## 2024-07-29 LAB — MAGNESIUM: Magnesium: 2.1 mg/dL (ref 1.7–2.4)

## 2024-07-29 MED ORDER — PANTOPRAZOLE SODIUM 40 MG IV SOLR
40.0000 mg | Freq: Once | INTRAVENOUS | Status: AC
  Administered 2024-07-29: 40 mg via INTRAVENOUS
  Filled 2024-07-29: qty 10

## 2024-07-29 NOTE — Progress Notes (Signed)
 Progress Note   Patient: [name] FMW:[id number] DOB: 1951-02-03 DOA: 07/28/2024     0 DOS: the patient was seen and examined on 07/29/2024   Brief hospital course: [name] is a 73 y.o. male with past medical history  of allergies to Dilantin , Dilaudid , morphine , sulfa, ACE inhibitor, Augmentin, codeine, obstructive chronic bronchitis, BPH, essential hypertension, GERD, trigeminal neuralgia, anxiety and depression, hiatal hernia,, anemia, history of pneumonia, enteritis, small bowel obstruction x 4 episodes in the past 3 years, B12 deficiency, hyperlipidemia, orthostatic dizziness, history of sinus infections,--comes today with nausea vomiting and abdominal pain that started earlier in the morning today. CT abdomen/ pelvis showed - ventral abdominal hernia and dilated fluid-filled small bowel with transition zone in midabdomen decompressed terminal ileum suggestive of small bowel obstruction. He is admitted to [facility] service with surgery consultation.  Assessment and Plan: Small bowel obstruction- He presented with nausea, vomiting and abdominal pain. Gen surgery evaluation appreciated. Continue NG to low wall suction. Continue gentle IV fluids. Continue antiemetics, pain control. Conservative management unless he does not respond.  BPH: Continue flomax , finasteride .  Essential hypertension- Continue amlodipine , losartan . Hold diuretics.  Trigeminal neuralgia. Continue Tegretol , gabapentin .      Out of bed to chair. Incentive spirometry. Nursing supportive care. Fall, aspiration precautions. Diet:  Diet Orders (From admission, onward)     Start     Ordered   07/28/24 1646  Diet NPO time specified Except for: Sips with Meds  Diet effective now       Question:  Except for  Answer:  Noralyn with Meds   07/28/24 1646           DVT prophylaxis: heparin  injection 5,000 Units Start: 07/28/24 1700  Level of care: Telemetry Medical   Code Status:  Full Code  Subjective: Patient is seen and examined today morning. He is lying in bed. NG tube intact. Nausea, pain improved. Wife at bedside.  Physical Exam: Vitals:   07/29/24 0308 07/29/24 0410 07/29/24 0758 07/29/24 0830  BP: (!) 150/91  (!) 165/96   Pulse: 93  82   Resp: 18  18   Temp: 97.6 F (36.4 C)  97.9 F (36.6 C)   TempSrc: Oral  Oral   SpO2: 96%  99% 99%  Weight:  103 kg    Height:        General - Elderly Caucasian male, no apparent distress HEENT - PERRLA, EOMI, atraumatic head, NG intact. Lung - Clear, basal rales, no rhonchi, wheezes. Heart - S1, S2 heard, no murmurs, rubs, trace pedal edema. Abdomen - Soft, left upper tender, bowel sounds good Neuro - Alert, awake and oriented x 3, non focal exam. Skin - Warm and dry.  Data Reviewed:      Latest Ref Rng & Units 07/29/2024    3:15 AM 07/28/2024   12:03 PM 07/28/2024   11:25 AM  CBC  WBC 4.0 - 10.5 K/uL 10.8   10.2   Hemoglobin 13.0 - 17.0 g/dL 82.6  82.2  82.0   Hematocrit 39.0 - 52.0 % 49.3  52.0  51.0   Platelets 150 - 400 K/uL 184   198       Latest Ref Rng & Units 07/29/2024    3:15 AM 07/28/2024   12:03 PM 07/28/2024   11:25 AM  BMP  Glucose 70 - 99 mg/dL 882  852  858   BUN 8 - 23 mg/dL 11  8  7    Creatinine 0.61 - 1.24 mg/dL  1.11  1.00  1.04   Sodium 135 - 145 mmol/L 133  132  133   Potassium 3.5 - 5.1 mmol/L 4.3  4.2  3.9   Chloride 98 - 111 mmol/L 93  94  93   CO2 22 - 32 mmol/L 29   28   Calcium  8.9 - 10.3 mg/dL 9.1   9.3    DG Abd Portable 1V-Small Bowel Obstruction Protocol-initial, 8 hr delay Result Date: 07/29/2024 EXAM: 1 VIEW XRAY OF THE ABDOMEN 07/29/2024 05:48:50 AM COMPARISON: 07/28/2024 CLINICAL HISTORY: SBO,DELAY FILM; ROVER FINDINGS: LINES, TUBES AND DEVICES: Enteric tube in place with tip at the level of the stomach. BOWEL: Dilated small bowel loops in left abdomen with some enteric contrast within small bowel loops. No enteric contrast material within the distal small bowel  or colon. SOFT TISSUES: No opaque urinary calculi. Previous herniorrhaphy tacks in left abdomen. BONES: No acute osseous abnormality. IMPRESSION: 1. Small bowel obstruction with dilated small bowel loops in the left abdomen with enteric contrast within small bowel loops. No contrast material within the distal small bowel or colon at this time. 2. Enteric tube with tip in the stomach. Electronically signed by: Waddell Calk MD 07/29/2024 06:04 AM EDT RP Workstation: [id number]   DG Abd Portable 1V-Small Bowel Protocol-Position Verification Result Date: 07/28/2024 CLINICAL DATA:  Confirm NG tube placement EXAM: PORTABLE ABDOMEN - 1 VIEW COMPARISON:  CT abdomen and pelvis 07/28/2024 FINDINGS: Limited field of view for tube placement verification purposes. An enteric tube is present with tip projecting over the mid abdomen consistent with location in the distal stomach. Surgical clips in the upper abdomen. Residual contrast material in the kidneys. Mildly gas distended upper abdominal small bowel may indicate obstruction. Linear atelectasis in the left lung base. Degenerative changes in the spine. IMPRESSION: Enteric tube tip projects over the mid abdomen consistent with location in the distal stomach. Mild gaseous distention of small bowel may indicate obstruction. Electronically Signed   By: Elsie Gravely M.D.   On: 07/28/2024 18:52   CT ABDOMEN PELVIS W CONTRAST Result Date: 07/28/2024 CLINICAL DATA:  Bowel obstruction suspected EXAM: CT ABDOMEN AND PELVIS WITH CONTRAST TECHNIQUE: Multidetector CT imaging of the abdomen and pelvis was performed using the standard protocol following bolus administration of intravenous contrast. RADIATION DOSE REDUCTION: This exam was performed according to the departmental dose-optimization program which includes automated exposure control, adjustment of the mA and/or kV according to patient size and/or use of iterative reconstruction technique. CONTRAST:  75mL OMNIPAQUE   IOHEXOL  350 MG/ML SOLN COMPARISON:  05/15/2021 FINDINGS: Lower chest: Motion artifact limits evaluation. Fine interstitial reticulonodular changes with peribronchial distribution possibly representing respiratory bronchiolitis. Hepatobiliary: No focal liver abnormality is seen. Status post cholecystectomy. No biliary dilatation. Pancreas: Fatty infiltration the pancreas. No focal lesion or collection identified. Spleen: Normal in size without focal abnormality. Adrenals/Urinary Tract: No adrenal gland nodules. 2 mm stone in the upper pole left kidney. No hydronephrosis or hydroureter. Subcentimeter renal cysts. No imaging follow-up is indicated. Bladder wall is mildly thickened possibly due to under distension or outlet obstruction. Correlate with urinalysis if cystitis is suspected. Stomach/Bowel: Stomach is decompressed. Surgical clips around the stomach and at the EG junction. Broad-based anterior abdominal wall hernia with scarring in the anterior abdominal wall. The hernia contains a portion of the transverse colon as well as several loops of small bowel. Small bowel are mildly dilated and fluid-filled with transition zone in the mid abdomen, likely at the level of the hernia. Terminal ileum is  decompressed. This appearance is consistent with bowel obstruction. Mild mesenteric edema. No bowel wall thickening or pneumatosis. Scattered stool throughout the colon without colonic distention. Diverticulosis of the sigmoid colon. Mild stranding around the sigmoid colon possibly representing early acute diverticulitis although more likely related to the mesenteric edema. No loculated collections. Appendix is normal. Vascular/Lymphatic: Aortic atherosclerosis. No enlarged abdominal or pelvic lymph nodes. Reproductive: Prostate gland is enlarged. Other: Scarring in the anterior abdominal wall, likely postoperative. Small amount of free fluid in the abdomen is likely reactive. No free air. Musculoskeletal: Degenerative  changes in the spine. No destructive bone lesions. IMPRESSION: 1. Broad-based ventral abdominal wall hernia containing small bowel and transverse colon. 2. Dilated fluid-filled small bowel with transition zone in the mid abdomen and decompressed terminal ileum suggesting small bowel obstruction. Transition zone likely involves the hernia. 3. Mild mesenteric edema and free fluid without loculation. No bowel pneumatosis or wall thickening. 4. Colonic diverticulosis without definitive evidence of diverticulitis. Mild pericolonic infiltration is likely related mesenteric edema. 5. Aortic atherosclerosis. 6. Nonobstructing renal stone. 7. Bladder wall thickening may be due to outlet obstruction or under distention. 8. Enlarged prostate gland. 9. Peribronchial changes in the lung bases may represent respiratory bronchiolitis. Electronically Signed   By: Elsie Gravely M.D.   On: 07/28/2024 15:39    Family Communication: Discussed with patient, wife at bedside, understand and agree. All questions answered.  Disposition: Status is: Inpatient Remains inpatient appropriate because: NG tube, IV fluids, antiemetics.  Planned Discharge Destination: Home with Home Health     Time spent: 44 minutes  Author: Concepcion Riser, MD 07/29/2024 1:11 PM Secure chat 7am to 7pm For on call review [email or web address].

## 2024-07-29 NOTE — Progress Notes (Signed)
   07/29/24 0944  TOC Brief Assessment  Insurance and Status Reviewed  Patient has primary care physician Yes  Home environment has been reviewed wife  Prior level of function: independent  Prior/Current Home Services No current home services  Social Drivers of Health Review SDOH reviewed no interventions necessary  Readmission risk has been reviewed Yes  Transition of care needs no transition of care needs at this time      Inpatient Care Management Team ( ICM)  has reviewed patient and  will continue to monitor patient advancement through interdisciplinary progression rounds. If new patient transition needs arise, please place a TOC consult.

## 2024-07-29 NOTE — Progress Notes (Signed)
 Progress Note     Subjective: Patient denies pain. Feels that he has improved since yesterday. Reports flatus. No BM. Denies nausea and vomiting  ROS  All negative with the exception of above.  Objective: Vital signs in last 24 hours: Temp:  [97.6 F (36.4 C)-98.5 F (36.9 C)] 97.9 F (36.6 C) (09/23 0758) Pulse Rate:  [82-108] 82 (09/23 0758) Resp:  [16-20] 18 (09/23 0758) BP: (139-165)/(77-104) 165/96 (09/23 0758) SpO2:  [96 %-100 %] 99 % (09/23 0830) Weight:  [103 kg-104.3 kg] 103 kg (09/23 0410) Last BM Date : 07/28/24  Intake/Output from previous day: 09/22 0701 - 09/23 0700 In: 535.1 [I.V.:35.1; IV Piggyback:500] Out: 1800 [Emesis/NG output:1800] Intake/Output this shift: No intake/output data recorded.  PE: General: Pleasant male who is laying in bed in NAD. HEENT: Head is normocephalic, atraumatic.  Sclera are noninjected. Conjunctiva anicteric. NGT in place connected to suction. No output noted in cannister during encounter. Heart: HR normal Lungs: Respiratory effort nonlabored. Abd: Soft, ND. Tenderness to palpation of the epigastric region and umbilical region. No rebound tenderness or guarding. Midline incision scar noted. Skin: Warm and dry. Psych: A&Ox3 with an appropriate affect.    Lab Results:  Recent Labs    07/28/24 1125 07/28/24 1203 07/29/24 0315  WBC 10.2  --  10.8*  HGB 17.9* 17.7* 17.3*  HCT 51.0 52.0 49.3  PLT 198  --  184   BMET Recent Labs    07/28/24 1125 07/28/24 1203 07/29/24 0315  NA 133* 132* 133*  K 3.9 4.2 4.3  CL 93* 94* 93*  CO2 28  --  29  GLUCOSE 141* 147* 117*  BUN 7* 8 11  CREATININE 1.04 1.00 1.11  CALCIUM  9.3  --  9.1   PT/INR No results for input(s): LABPROT, INR in the last 72 hours. CMP     Component Value Date/Time   NA 133 (L) 07/29/2024 0315   NA 132 (L) 12/28/2023 1237   K 4.3 07/29/2024 0315   CL 93 (L) 07/29/2024 0315   CO2 29 07/29/2024 0315   GLUCOSE 117 (H) 07/29/2024 0315    GLUCOSE 118 (H) 10/25/2006 1414   BUN 11 07/29/2024 0315   BUN 10 12/28/2023 1237   CREATININE 1.11 07/29/2024 0315   CREATININE 0.97 12/31/2012 1108   CALCIUM  9.1 07/29/2024 0315   CALCIUM  9.3 01/23/2012 1045   PROT 6.9 07/29/2024 0315   ALBUMIN 3.6 07/29/2024 0315   AST 15 07/29/2024 0315   ALT 14 07/29/2024 0315   ALKPHOS 77 07/29/2024 0315   BILITOT 0.8 07/29/2024 0315   GFRNONAA >60 07/29/2024 0315   GFRAA >60 12/26/2019 0722   Lipase     Component Value Date/Time   LIPASE 13 07/28/2024 1125       Studies/Results: DG Abd Portable 1V-Small Bowel Obstruction Protocol-initial, 8 hr delay Result Date: 07/29/2024 EXAM: 1 VIEW XRAY OF THE ABDOMEN 07/29/2024 05:48:50 AM COMPARISON: 07/28/2024 CLINICAL HISTORY: SBO,DELAY FILM; ROVER FINDINGS: LINES, TUBES AND DEVICES: Enteric tube in place with tip at the level of the stomach. BOWEL: Dilated small bowel loops in left abdomen with some enteric contrast within small bowel loops. No enteric contrast material within the distal small bowel or colon. SOFT TISSUES: No opaque urinary calculi. Previous herniorrhaphy tacks in left abdomen. BONES: No acute osseous abnormality. IMPRESSION: 1. Small bowel obstruction with dilated small bowel loops in the left abdomen with enteric contrast within small bowel loops. No contrast material within the distal small bowel or colon at  this time. 2. Enteric tube with tip in the stomach. Electronically signed by: Waddell Calk MD 07/29/2024 06:04 AM EDT RP Workstation: [id number]   DG Abd Portable 1V-Small Bowel Protocol-Position Verification Result Date: 07/28/2024 CLINICAL DATA:  Confirm NG tube placement EXAM: PORTABLE ABDOMEN - 1 VIEW COMPARISON:  CT abdomen and pelvis 07/28/2024 FINDINGS: Limited field of view for tube placement verification purposes. An enteric tube is present with tip projecting over the mid abdomen consistent with location in the distal stomach. Surgical clips in the upper abdomen.  Residual contrast material in the kidneys. Mildly gas distended upper abdominal small bowel may indicate obstruction. Linear atelectasis in the left lung base. Degenerative changes in the spine. IMPRESSION: Enteric tube tip projects over the mid abdomen consistent with location in the distal stomach. Mild gaseous distention of small bowel may indicate obstruction. Electronically Signed   By: Elsie Gravely M.D.   On: 07/28/2024 18:52   CT ABDOMEN PELVIS W CONTRAST Result Date: 07/28/2024 CLINICAL DATA:  Bowel obstruction suspected EXAM: CT ABDOMEN AND PELVIS WITH CONTRAST TECHNIQUE: Multidetector CT imaging of the abdomen and pelvis was performed using the standard protocol following bolus administration of intravenous contrast. RADIATION DOSE REDUCTION: This exam was performed according to the departmental dose-optimization program which includes automated exposure control, adjustment of the mA and/or kV according to patient size and/or use of iterative reconstruction technique. CONTRAST:  75mL OMNIPAQUE  IOHEXOL  350 MG/ML SOLN COMPARISON:  05/15/2021 FINDINGS: Lower chest: Motion artifact limits evaluation. Fine interstitial reticulonodular changes with peribronchial distribution possibly representing respiratory bronchiolitis. Hepatobiliary: No focal liver abnormality is seen. Status post cholecystectomy. No biliary dilatation. Pancreas: Fatty infiltration the pancreas. No focal lesion or collection identified. Spleen: Normal in size without focal abnormality. Adrenals/Urinary Tract: No adrenal gland nodules. 2 mm stone in the upper pole left kidney. No hydronephrosis or hydroureter. Subcentimeter renal cysts. No imaging follow-up is indicated. Bladder wall is mildly thickened possibly due to under distension or outlet obstruction. Correlate with urinalysis if cystitis is suspected. Stomach/Bowel: Stomach is decompressed. Surgical clips around the stomach and at the EG junction. Broad-based anterior abdominal  wall hernia with scarring in the anterior abdominal wall. The hernia contains a portion of the transverse colon as well as several loops of small bowel. Small bowel are mildly dilated and fluid-filled with transition zone in the mid abdomen, likely at the level of the hernia. Terminal ileum is decompressed. This appearance is consistent with bowel obstruction. Mild mesenteric edema. No bowel wall thickening or pneumatosis. Scattered stool throughout the colon without colonic distention. Diverticulosis of the sigmoid colon. Mild stranding around the sigmoid colon possibly representing early acute diverticulitis although more likely related to the mesenteric edema. No loculated collections. Appendix is normal. Vascular/Lymphatic: Aortic atherosclerosis. No enlarged abdominal or pelvic lymph nodes. Reproductive: Prostate gland is enlarged. Other: Scarring in the anterior abdominal wall, likely postoperative. Small amount of free fluid in the abdomen is likely reactive. No free air. Musculoskeletal: Degenerative changes in the spine. No destructive bone lesions. IMPRESSION: 1. Broad-based ventral abdominal wall hernia containing small bowel and transverse colon. 2. Dilated fluid-filled small bowel with transition zone in the mid abdomen and decompressed terminal ileum suggesting small bowel obstruction. Transition zone likely involves the hernia. 3. Mild mesenteric edema and free fluid without loculation. No bowel pneumatosis or wall thickening. 4. Colonic diverticulosis without definitive evidence of diverticulitis. Mild pericolonic infiltration is likely related mesenteric edema. 5. Aortic atherosclerosis. 6. Nonobstructing renal stone. 7. Bladder wall thickening may be due to outlet  obstruction or under distention. 8. Enlarged prostate gland. 9. Peribronchial changes in the lung bases may represent respiratory bronchiolitis. Electronically Signed   By: Elsie Gravely M.D.   On: 07/28/2024 15:39     Anti-infectives: Anti-infectives (From admission, onward)    None        Assessment/Plan SBO - CT from 9/22 showed broad-based ventral abdominal wall hernia containing small bowel and transverse colon. Dilated fluid-filled small bowel with transition zone in the mid abdomen and decompressed terminal ileum suggesting small bowel obstruction. Transition zone likely involves the hernia. Mild mesenteric edema and free fluid without loculation. No bowel pneumatosis or wall thickening. Colonic diverticulosis without definitive evidence of diverticulitis. Mild pericolonic infiltration is likely related mesenteric edema. - 8 hour delay film showed small bowel obstruction with dilated small bowel loops in the left abdomen with enteric contrast within small bowel loops. No contrast material within the distal small bowel or colon at this time. - Afebrile. Hypertensive. - WBC 10.8 from 10.2.  - Abdominal exam with some tenderness of the epigastric region. Pain and distention improved. Denies n/v, and BM. Having flatus. - Continue NGT for continued decompression and keep NPO. Total output noted of 9/22-9/23 1800 mL. - Planning for repeat abd xray. - Keep K >=4, Phos >= 3, Mg >= 2 and mobilize for bowel function. - Hopefully patient will continue to improve with conservative management. If patient fails to improve with conservative management, they may require exploratory surgery during admission. No surgical intervention planned for this time.     FEN - NPO, NGT to LIWS, IVF per admitting  VTE - heparin  injection ID - None currently    LOS: 0 days   I reviewed specialist notes, hospitalist notes, last 24 h vitals and pain scores, last 48 h intake and output, last 24 h labs and trends, and last 24 h imaging results.  This care required moderate level of medical decision making.    Marjorie Carlyon Favre, [facility] Surgery 07/29/2024, 9:33 AM Please see Amion for pager number  during day hours 7:00am-4:30pm

## 2024-07-29 NOTE — Plan of Care (Signed)

## 2024-07-30 DIAGNOSIS — I10 Essential (primary) hypertension: Secondary | ICD-10-CM | POA: Diagnosis not present

## 2024-07-30 DIAGNOSIS — N401 Enlarged prostate with lower urinary tract symptoms: Secondary | ICD-10-CM | POA: Diagnosis not present

## 2024-07-30 DIAGNOSIS — K56609 Unspecified intestinal obstruction, unspecified as to partial versus complete obstruction: Secondary | ICD-10-CM | POA: Diagnosis not present

## 2024-07-30 DIAGNOSIS — R1114 Bilious vomiting: Secondary | ICD-10-CM | POA: Diagnosis not present

## 2024-07-30 MED ORDER — PHENOL 1.4 % MT LIQD: 1.0000 | OROMUCOSAL | Status: DC | PRN

## 2024-07-30 MED ORDER — SODIUM CHLORIDE 0.9 % IV SOLN: INTRAVENOUS | Status: DC

## 2024-07-30 MED ORDER — PANTOPRAZOLE SODIUM 40 MG IV SOLR
40.0000 mg | Freq: Every day | INTRAVENOUS | Status: DC
Start: 2024-07-30 — End: 2024-08-01
  Administered 2024-07-30 – 2024-07-31 (×2): 40 mg via INTRAVENOUS
  Filled 2024-07-30 (×2): qty 10

## 2024-07-30 NOTE — Plan of Care (Signed)

## 2024-07-30 NOTE — Progress Notes (Signed)
 Progress Note   Patient: [name] FMW:[id number] DOB: 1951-05-22 DOA: 07/28/2024     1 DOS: the patient was seen and examined on 07/30/2024   Brief hospital course: Kwinton Maahs is a 73 y.o. male with past medical history  of allergies to Dilantin , Dilaudid , morphine , sulfa, ACE inhibitor, Augmentin, codeine, obstructive chronic bronchitis, BPH, essential hypertension, GERD, trigeminal neuralgia, anxiety and depression, hiatal hernia,, anemia, history of pneumonia, enteritis, small bowel obstruction x 4 episodes in the past 3 years, B12 deficiency, hyperlipidemia, orthostatic dizziness, history of sinus infections,--comes today with nausea vomiting and abdominal pain that started earlier in the morning today. CT abdomen/ pelvis showed - ventral abdominal hernia and dilated fluid-filled small bowel with transition zone in midabdomen decompressed terminal ileum suggestive of small bowel obstruction. He is admitted to [facility] service with surgery consultation.  Assessment and Plan: Small bowel obstruction- He presented with nausea, vomiting and abdominal pain. Gen surgery follow up appreciated. NG tube clamped. Continue gentle IV fluids. Continue antiemetics, pain control. Conservative management unless he does not respond.  BPH: Continue flomax , finasteride .  Essential hypertension- Continue amlodipine , losartan .  Trigeminal neuralgia. Continue Tegretol , gabapentin .      Out of bed to chair. Incentive spirometry. Nursing supportive care. Fall, aspiration precautions. Diet:  Diet Orders (From admission, onward)     Start     Ordered   07/28/24 1646  Diet NPO time specified Except for: Sips with Meds  Diet effective now       Question:  Except for  Answer:  Noralyn with Meds   07/28/24 1646           DVT prophylaxis: heparin  injection 5,000 Units Start: 07/28/24 1700  Level of care: Telemetry Medical   Code Status: Full Code  Subjective: Patient is  seen and examined today morning. He is walking in hallway along with his wife. NG tube clamped. Denies nausea, abdominal pain.  Physical Exam: Vitals:   07/30/24 0522 07/30/24 0624 07/30/24 0758 07/30/24 0946  BP: (!) 188/98 (!) 160/94 (!) 153/88 (!) 144/85  Pulse: 83 80 80   Resp: 16  16   Temp: 98.3 F (36.8 C)  98.3 F (36.8 C)   TempSrc: Oral  Oral   SpO2: 97% 97% 99%   Weight:      Height:        General - Elderly Caucasian male, no apparent distress HEENT - PERRLA, EOMI, atraumatic head, NG intact. Lung - Clear, basal rales, no rhonchi, wheezes. Heart - S1, S2 heard, no murmurs, rubs, trace pedal edema. Abdomen - Soft, left upper tender, bowel sounds good Neuro - Alert, awake and oriented x 3, non focal exam. Skin - Warm and dry.  Data Reviewed:      Latest Ref Rng & Units 07/29/2024    3:15 AM 07/28/2024   12:03 PM 07/28/2024   11:25 AM  CBC  WBC 4.0 - 10.5 K/uL 10.8   10.2   Hemoglobin 13.0 - 17.0 g/dL 82.6  82.2  82.0   Hematocrit 39.0 - 52.0 % 49.3  52.0  51.0   Platelets 150 - 400 K/uL 184   198       Latest Ref Rng & Units 07/29/2024    3:15 AM 07/28/2024   12:03 PM 07/28/2024   11:25 AM  BMP  Glucose 70 - 99 mg/dL 882  852  858   BUN 8 - 23 mg/dL 11  8  7    Creatinine 0.61 - 1.24 mg/dL 8.88  1.00  1.04   Sodium 135 - 145 mmol/L 133  132  133   Potassium 3.5 - 5.1 mmol/L 4.3  4.2  3.9   Chloride 98 - 111 mmol/L 93  94  93   CO2 22 - 32 mmol/L 29   28   Calcium  8.9 - 10.3 mg/dL 9.1   9.3    DG Abd Portable 1V Result Date: 07/29/2024 CLINICAL DATA:  Small bowel obstruction EXAM: PORTABLE ABDOMEN - 1 VIEW COMPARISON:  07/29/2024 FINDINGS: An enteric tube is present with tip projecting over the mid abdomen consistent with location in the upper stomach. Surgical clips in the upper abdomen. Mesh in the mid abdomen consistent with prior ventral hernia repair. Scattered dilated gas-filled small bowel. Gas-filled nondistended colon. Contrast material is suggested  in the colon. This suggests no evidence of complete small bowel obstruction. Residual contrast material demonstrated in the bladder. No radiopaque stones. Degenerative changes in the spine and hips. Lung bases are clear. IMPRESSION: Mild gaseous distention of small bowel. Contrast material is seen in the colon suggesting that this does not represent complete small bowel obstruction. Electronically Signed   By: Elsie Gravely M.D.   On: 07/29/2024 22:03   DG Abd Portable 1V-Small Bowel Obstruction Protocol-initial, 8 hr delay Result Date: 07/29/2024 EXAM: 1 VIEW XRAY OF THE ABDOMEN 07/29/2024 05:48:50 AM COMPARISON: 07/28/2024 CLINICAL HISTORY: SBO,DELAY FILM; ROVER FINDINGS: LINES, TUBES AND DEVICES: Enteric tube in place with tip at the level of the stomach. BOWEL: Dilated small bowel loops in left abdomen with some enteric contrast within small bowel loops. No enteric contrast material within the distal small bowel or colon. SOFT TISSUES: No opaque urinary calculi. Previous herniorrhaphy tacks in left abdomen. BONES: No acute osseous abnormality. IMPRESSION: 1. Small bowel obstruction with dilated small bowel loops in the left abdomen with enteric contrast within small bowel loops. No contrast material within the distal small bowel or colon at this time. 2. Enteric tube with tip in the stomach. Electronically signed by: Waddell Calk MD 07/29/2024 06:04 AM EDT RP Workstation: [id number]   DG Abd Portable 1V-Small Bowel Protocol-Position Verification Result Date: 07/28/2024 CLINICAL DATA:  Confirm NG tube placement EXAM: PORTABLE ABDOMEN - 1 VIEW COMPARISON:  CT abdomen and pelvis 07/28/2024 FINDINGS: Limited field of view for tube placement verification purposes. An enteric tube is present with tip projecting over the mid abdomen consistent with location in the distal stomach. Surgical clips in the upper abdomen. Residual contrast material in the kidneys. Mildly gas distended upper abdominal small bowel  may indicate obstruction. Linear atelectasis in the left lung base. Degenerative changes in the spine. IMPRESSION: Enteric tube tip projects over the mid abdomen consistent with location in the distal stomach. Mild gaseous distention of small bowel may indicate obstruction. Electronically Signed   By: Elsie Gravely M.D.   On: 07/28/2024 18:52   CT ABDOMEN PELVIS W CONTRAST Result Date: 07/28/2024 CLINICAL DATA:  Bowel obstruction suspected EXAM: CT ABDOMEN AND PELVIS WITH CONTRAST TECHNIQUE: Multidetector CT imaging of the abdomen and pelvis was performed using the standard protocol following bolus administration of intravenous contrast. RADIATION DOSE REDUCTION: This exam was performed according to the departmental dose-optimization program which includes automated exposure control, adjustment of the mA and/or kV according to patient size and/or use of iterative reconstruction technique. CONTRAST:  75mL OMNIPAQUE  IOHEXOL  350 MG/ML SOLN COMPARISON:  05/15/2021 FINDINGS: Lower chest: Motion artifact limits evaluation. Fine interstitial reticulonodular changes with peribronchial distribution possibly representing respiratory bronchiolitis. Hepatobiliary: No focal  liver abnormality is seen. Status post cholecystectomy. No biliary dilatation. Pancreas: Fatty infiltration the pancreas. No focal lesion or collection identified. Spleen: Normal in size without focal abnormality. Adrenals/Urinary Tract: No adrenal gland nodules. 2 mm stone in the upper pole left kidney. No hydronephrosis or hydroureter. Subcentimeter renal cysts. No imaging follow-up is indicated. Bladder wall is mildly thickened possibly due to under distension or outlet obstruction. Correlate with urinalysis if cystitis is suspected. Stomach/Bowel: Stomach is decompressed. Surgical clips around the stomach and at the EG junction. Broad-based anterior abdominal wall hernia with scarring in the anterior abdominal wall. The hernia contains a portion of  the transverse colon as well as several loops of small bowel. Small bowel are mildly dilated and fluid-filled with transition zone in the mid abdomen, likely at the level of the hernia. Terminal ileum is decompressed. This appearance is consistent with bowel obstruction. Mild mesenteric edema. No bowel wall thickening or pneumatosis. Scattered stool throughout the colon without colonic distention. Diverticulosis of the sigmoid colon. Mild stranding around the sigmoid colon possibly representing early acute diverticulitis although more likely related to the mesenteric edema. No loculated collections. Appendix is normal. Vascular/Lymphatic: Aortic atherosclerosis. No enlarged abdominal or pelvic lymph nodes. Reproductive: Prostate gland is enlarged. Other: Scarring in the anterior abdominal wall, likely postoperative. Small amount of free fluid in the abdomen is likely reactive. No free air. Musculoskeletal: Degenerative changes in the spine. No destructive bone lesions. IMPRESSION: 1. Broad-based ventral abdominal wall hernia containing small bowel and transverse colon. 2. Dilated fluid-filled small bowel with transition zone in the mid abdomen and decompressed terminal ileum suggesting small bowel obstruction. Transition zone likely involves the hernia. 3. Mild mesenteric edema and free fluid without loculation. No bowel pneumatosis or wall thickening. 4. Colonic diverticulosis without definitive evidence of diverticulitis. Mild pericolonic infiltration is likely related mesenteric edema. 5. Aortic atherosclerosis. 6. Nonobstructing renal stone. 7. Bladder wall thickening may be due to outlet obstruction or under distention. 8. Enlarged prostate gland. 9. Peribronchial changes in the lung bases may represent respiratory bronchiolitis. Electronically Signed   By: Elsie Gravely M.D.   On: 07/28/2024 15:39    Family Communication: Discussed with patient, wife at bedside, understand and agree. All questions  answered.  Disposition: Status is: Inpatient Remains inpatient appropriate because: NG tube, IV fluids, antiemetics.  Planned Discharge Destination: Home with Home Health     Time spent: 45 minutes  Author: Concepcion Riser, MD 07/30/2024 2:27 PM Secure chat 7am to 7pm For on call review [email or web address].

## 2024-07-30 NOTE — Care Plan (Signed)
 Patient has no complaint of vomiting, nausea, distention. GI provider made aware. NG tube removed as per order.

## 2024-07-30 NOTE — Care Plan (Signed)
NG tube clamped as per order.

## 2024-07-30 NOTE — Progress Notes (Signed)
 Progress Note     Subjective: Patient reports that he is feeling better. Denies pain. Denies BM but he is passing flatus. Denies nausea and vomiting.  ROS  All negative with the exception of above.  Objective: Vital signs in last 24 hours: Temp:  [98.3 F (36.8 C)-98.5 F (36.9 C)] 98.3 F (36.8 C) (09/24 0758) Pulse Rate:  [75-83] 80 (09/24 0758) Resp:  [16-18] 16 (09/24 0758) BP: (153-188)/(88-98) 153/88 (09/24 0758) SpO2:  [97 %-99 %] 99 % (09/24 0758) Weight:  [102.2 kg] 102.2 kg (09/24 0500) Last BM Date : 07/28/24  Intake/Output from previous day: 09/23 0701 - 09/24 0700 In: 0  Out: 650 [Emesis/NG output:650] Intake/Output this shift: No intake/output data recorded.  PE: General: Pleasant male who is laying in bed in NAD. HEENT: Head is normocephalic, atraumatic.  Sclera are noninjected. Conjunctiva anicteric. NGT in place connected to suction. No output noted in cannister during encounter. Heart: HR normal. Lungs: Respiratory effort nonlabored. Abd: Soft, ND. Mild tenderness to palpation of the epigastric region (improved since yesterday). No rebound tenderness or guarding. Midline incision scar noted. Skin: Warm and dry. Psych: A&Ox3 with an appropriate affect.    Lab Results:  Recent Labs    07/28/24 1125 07/28/24 1203 07/29/24 0315  WBC 10.2  --  10.8*  HGB 17.9* 17.7* 17.3*  HCT 51.0 52.0 49.3  PLT 198  --  184   BMET Recent Labs    07/28/24 1125 07/28/24 1203 07/29/24 0315  NA 133* 132* 133*  K 3.9 4.2 4.3  CL 93* 94* 93*  CO2 28  --  29  GLUCOSE 141* 147* 117*  BUN 7* 8 11  CREATININE 1.04 1.00 1.11  CALCIUM  9.3  --  9.1   PT/INR No results for input(s): LABPROT, INR in the last 72 hours. CMP     Component Value Date/Time   NA 133 (L) 07/29/2024 0315   NA 132 (L) 12/28/2023 1237   K 4.3 07/29/2024 0315   CL 93 (L) 07/29/2024 0315   CO2 29 07/29/2024 0315   GLUCOSE 117 (H) 07/29/2024 0315   GLUCOSE 118 (H) 10/25/2006 1414    BUN 11 07/29/2024 0315   BUN 10 12/28/2023 1237   CREATININE 1.11 07/29/2024 0315   CREATININE 0.97 12/31/2012 1108   CALCIUM  9.1 07/29/2024 0315   CALCIUM  9.3 01/23/2012 1045   PROT 6.9 07/29/2024 0315   ALBUMIN 3.6 07/29/2024 0315   AST 15 07/29/2024 0315   ALT 14 07/29/2024 0315   ALKPHOS 77 07/29/2024 0315   BILITOT 0.8 07/29/2024 0315   GFRNONAA >60 07/29/2024 0315   GFRAA >60 12/26/2019 0722   Lipase     Component Value Date/Time   LIPASE 13 07/28/2024 1125       Studies/Results: DG Abd Portable 1V Result Date: 07/29/2024 CLINICAL DATA:  Small bowel obstruction EXAM: PORTABLE ABDOMEN - 1 VIEW COMPARISON:  07/29/2024 FINDINGS: An enteric tube is present with tip projecting over the mid abdomen consistent with location in the upper stomach. Surgical clips in the upper abdomen. Mesh in the mid abdomen consistent with prior ventral hernia repair. Scattered dilated gas-filled small bowel. Gas-filled nondistended colon. Contrast material is suggested in the colon. This suggests no evidence of complete small bowel obstruction. Residual contrast material demonstrated in the bladder. No radiopaque stones. Degenerative changes in the spine and hips. Lung bases are clear. IMPRESSION: Mild gaseous distention of small bowel. Contrast material is seen in the colon suggesting that this does not  represent complete small bowel obstruction. Electronically Signed   By: Elsie Gravely M.D.   On: 07/29/2024 22:03   DG Abd Portable 1V-Small Bowel Obstruction Protocol-initial, 8 hr delay Result Date: 07/29/2024 EXAM: 1 VIEW XRAY OF THE ABDOMEN 07/29/2024 05:48:50 AM COMPARISON: 07/28/2024 CLINICAL HISTORY: SBO,DELAY FILM; ROVER FINDINGS: LINES, TUBES AND DEVICES: Enteric tube in place with tip at the level of the stomach. BOWEL: Dilated small bowel loops in left abdomen with some enteric contrast within small bowel loops. No enteric contrast material within the distal small bowel or colon. SOFT  TISSUES: No opaque urinary calculi. Previous herniorrhaphy tacks in left abdomen. BONES: No acute osseous abnormality. IMPRESSION: 1. Small bowel obstruction with dilated small bowel loops in the left abdomen with enteric contrast within small bowel loops. No contrast material within the distal small bowel or colon at this time. 2. Enteric tube with tip in the stomach. Electronically signed by: Waddell Calk MD 07/29/2024 06:04 AM EDT RP Workstation: [id number]   DG Abd Portable 1V-Small Bowel Protocol-Position Verification Result Date: 07/28/2024 CLINICAL DATA:  Confirm NG tube placement EXAM: PORTABLE ABDOMEN - 1 VIEW COMPARISON:  CT abdomen and pelvis 07/28/2024 FINDINGS: Limited field of view for tube placement verification purposes. An enteric tube is present with tip projecting over the mid abdomen consistent with location in the distal stomach. Surgical clips in the upper abdomen. Residual contrast material in the kidneys. Mildly gas distended upper abdominal small bowel may indicate obstruction. Linear atelectasis in the left lung base. Degenerative changes in the spine. IMPRESSION: Enteric tube tip projects over the mid abdomen consistent with location in the distal stomach. Mild gaseous distention of small bowel may indicate obstruction. Electronically Signed   By: Elsie Gravely M.D.   On: 07/28/2024 18:52   CT ABDOMEN PELVIS W CONTRAST Result Date: 07/28/2024 CLINICAL DATA:  Bowel obstruction suspected EXAM: CT ABDOMEN AND PELVIS WITH CONTRAST TECHNIQUE: Multidetector CT imaging of the abdomen and pelvis was performed using the standard protocol following bolus administration of intravenous contrast. RADIATION DOSE REDUCTION: This exam was performed according to the departmental dose-optimization program which includes automated exposure control, adjustment of the mA and/or kV according to patient size and/or use of iterative reconstruction technique. CONTRAST:  75mL OMNIPAQUE  IOHEXOL  350 MG/ML  SOLN COMPARISON:  05/15/2021 FINDINGS: Lower chest: Motion artifact limits evaluation. Fine interstitial reticulonodular changes with peribronchial distribution possibly representing respiratory bronchiolitis. Hepatobiliary: No focal liver abnormality is seen. Status post cholecystectomy. No biliary dilatation. Pancreas: Fatty infiltration the pancreas. No focal lesion or collection identified. Spleen: Normal in size without focal abnormality. Adrenals/Urinary Tract: No adrenal gland nodules. 2 mm stone in the upper pole left kidney. No hydronephrosis or hydroureter. Subcentimeter renal cysts. No imaging follow-up is indicated. Bladder wall is mildly thickened possibly due to under distension or outlet obstruction. Correlate with urinalysis if cystitis is suspected. Stomach/Bowel: Stomach is decompressed. Surgical clips around the stomach and at the EG junction. Broad-based anterior abdominal wall hernia with scarring in the anterior abdominal wall. The hernia contains a portion of the transverse colon as well as several loops of small bowel. Small bowel are mildly dilated and fluid-filled with transition zone in the mid abdomen, likely at the level of the hernia. Terminal ileum is decompressed. This appearance is consistent with bowel obstruction. Mild mesenteric edema. No bowel wall thickening or pneumatosis. Scattered stool throughout the colon without colonic distention. Diverticulosis of the sigmoid colon. Mild stranding around the sigmoid colon possibly representing early acute diverticulitis although more likely related  to the mesenteric edema. No loculated collections. Appendix is normal. Vascular/Lymphatic: Aortic atherosclerosis. No enlarged abdominal or pelvic lymph nodes. Reproductive: Prostate gland is enlarged. Other: Scarring in the anterior abdominal wall, likely postoperative. Small amount of free fluid in the abdomen is likely reactive. No free air. Musculoskeletal: Degenerative changes in the  spine. No destructive bone lesions. IMPRESSION: 1. Broad-based ventral abdominal wall hernia containing small bowel and transverse colon. 2. Dilated fluid-filled small bowel with transition zone in the mid abdomen and decompressed terminal ileum suggesting small bowel obstruction. Transition zone likely involves the hernia. 3. Mild mesenteric edema and free fluid without loculation. No bowel pneumatosis or wall thickening. 4. Colonic diverticulosis without definitive evidence of diverticulitis. Mild pericolonic infiltration is likely related mesenteric edema. 5. Aortic atherosclerosis. 6. Nonobstructing renal stone. 7. Bladder wall thickening may be due to outlet obstruction or under distention. 8. Enlarged prostate gland. 9. Peribronchial changes in the lung bases may represent respiratory bronchiolitis. Electronically Signed   By: Elsie Gravely M.D.   On: 07/28/2024 15:39    Anti-infectives: Anti-infectives (From admission, onward)    None        Assessment/Plan SBO - CT from 9/22 showed broad-based ventral abdominal wall hernia containing small bowel and transverse colon. Dilated fluid-filled small bowel with transition zone in the mid abdomen and decompressed terminal ileum suggesting small bowel obstruction. Transition zone likely involves the hernia. Mild mesenteric edema and free fluid without loculation. No bowel pneumatosis or wall thickening. Colonic diverticulosis without definitive evidence of diverticulitis. Mild pericolonic infiltration is likely related mesenteric edema. - 8 hour delay film showed small bowel obstruction with dilated small bowel loops in the left abdomen with enteric contrast within small bowel loops. No contrast material within the distal small bowel or colon at this time. - 24 hour film showed mild gaseous distention of small bowel. Contrast material is seen in the colon suggesting that this does not represent complete small bowel obstruction. - Afebrile.  Hypertensive. - Labs from 9/23: WBC 10.8; HGB 17.3 - Pain and distention improved. Denies n/v, and BM. Having flatus. - Total output noted of 9/23-9/24 650 mL. Because contrast noted in the colon, will attempt clamp trial today. If clamp trial successful, will consider removal of NGT this afternoon. - Keep K >=4, Phos >= 3, Mg >= 2 and mobilize for bowel function. - Hopefully patient will continue to improve with conservative management. No surgical intervention planned for this time.     FEN - NPO, NGT (Clamp trial today); IVF per admitting  VTE - heparin  injection ID - None currently    LOS: 1 day   I reviewed specialist notes, hospitalist notes, nursing notes, last 24 h vitals and pain scores, last 48 h intake and output, last 24 h labs and trends, and last 24 h imaging results.  This care required moderate level of medical decision making.    Marjorie Carlyon Favre, [facility] 07/30/2024, 8:39 AM Please see Amion for pager number during day hours 7:00am-4:30pm

## 2024-07-30 NOTE — Progress Notes (Signed)
 Pt NG tube to low intermittent wall suction with a total of 250 dark brown gastric contents. PT also c/o of sore throat. BP is 188/ 98. PRN 5mg  hydralazine  IV given PRN.

## 2024-07-30 NOTE — Plan of Care (Signed)
 Patient has no complaints of pain, able to walk in the hallway more often with wife. NG tube out. Problem: Education: Goal: Knowledge of General Education information will improve Description: Including pain rating scale, medication(s)/side effects and non-pharmacologic comfort measures Outcome: Progressing   Problem: Health Behavior/Discharge Planning: Goal: Ability to manage health-related needs will improve Outcome: Progressing   Problem: Clinical Measurements: Goal: Ability to maintain clinical measurements within normal limits will improve Outcome: Progressing Goal: Will remain free from infection Outcome: Progressing Goal: Diagnostic test results will improve Outcome: Progressing Goal: Respiratory complications will improve Outcome: Progressing Goal: Cardiovascular complication will be avoided Outcome: Progressing   Problem: Activity: Goal: Risk for activity intolerance will decrease Outcome: Progressing   Problem: Nutrition: Goal: Adequate nutrition will be maintained Outcome: Progressing   Problem: Coping: Goal: Level of anxiety will decrease Outcome: Progressing   Problem: Elimination: Goal: Will not experience complications related to bowel motility Outcome: Progressing Goal: Will not experience complications related to urinary retention Outcome: Progressing   Problem: Pain Managment: Goal: General experience of comfort will improve and/or be controlled Outcome: Progressing   Problem: Safety: Goal: Ability to remain free from injury will improve Outcome: Progressing   Problem: Skin Integrity: Goal: Risk for impaired skin integrity will decrease Outcome: Progressing

## 2024-07-31 DIAGNOSIS — N401 Enlarged prostate with lower urinary tract symptoms: Secondary | ICD-10-CM | POA: Diagnosis not present

## 2024-07-31 DIAGNOSIS — R1114 Bilious vomiting: Secondary | ICD-10-CM | POA: Diagnosis not present

## 2024-07-31 DIAGNOSIS — I10 Essential (primary) hypertension: Secondary | ICD-10-CM | POA: Diagnosis not present

## 2024-07-31 DIAGNOSIS — K56609 Unspecified intestinal obstruction, unspecified as to partial versus complete obstruction: Secondary | ICD-10-CM | POA: Diagnosis not present

## 2024-07-31 LAB — COMPREHENSIVE METABOLIC PANEL WITH GFR
ALT: 55 U/L — ABNORMAL HIGH (ref 0–44)
AST: 63 U/L — ABNORMAL HIGH (ref 15–41)
Albumin: 3.5 g/dL (ref 3.5–5.0)
Alkaline Phosphatase: 71 U/L (ref 38–126)
Anion gap: 9 (ref 5–15)
BUN: 15 mg/dL (ref 8–23)
CO2: 27 mmol/L (ref 22–32)
Calcium: 8.9 mg/dL (ref 8.9–10.3)
Chloride: 99 mmol/L (ref 98–111)
Creatinine, Ser: 0.96 mg/dL (ref 0.61–1.24)
GFR, Estimated: >60 mL/min (ref >60)
Glucose, Bld: 132 mg/dL — ABNORMAL HIGH (ref 70–99)
Potassium: 3.9 mmol/L (ref 3.5–5.1)
Sodium: 135 mmol/L (ref 135–145)
Total Bilirubin: 1.1 mg/dL (ref 0.0–1.2)
Total Protein: 6.6 g/dL (ref 6.5–8.1)

## 2024-07-31 LAB — CBC
HCT: 44.6 % (ref 39.0–52.0)
Hemoglobin: 15.4 g/dL (ref 13.0–17.0)
MCH: 31 pg (ref 26.0–34.0)
MCHC: 34.5 g/dL (ref 30.0–36.0)
MCV: 89.9 fL (ref 80.0–100.0)
Platelets: 142 K/uL — ABNORMAL LOW (ref 150–400)
RBC: 4.96 MIL/uL (ref 4.22–5.81)
RDW: 12.8 % (ref 11.5–15.5)
WBC: 7 K/uL (ref 4.0–10.5)
nRBC: 0 % (ref 0.0–0.2)

## 2024-07-31 NOTE — Progress Notes (Signed)
 Progress Note   Patient: [name] FMW:[id number] DOB: November 14, 1950 DOA: 07/28/2024     2 DOS: the patient was seen and examined on 07/31/2024   Brief hospital course: [name] is a 73 y.o. male with past medical history  of allergies to Dilantin , Dilaudid , morphine , sulfa, ACE inhibitor, Augmentin, codeine, obstructive chronic bronchitis, BPH, essential hypertension, GERD, trigeminal neuralgia, anxiety and depression, hiatal hernia,, anemia, history of pneumonia, enteritis, small bowel obstruction x 4 episodes in the past 3 years, B12 deficiency, hyperlipidemia, orthostatic dizziness, history of sinus infections,--comes today with nausea vomiting and abdominal pain that started earlier in the morning today. CT abdomen/ pelvis showed - ventral abdominal hernia and dilated fluid-filled small bowel with transition zone in midabdomen decompressed terminal ileum suggestive of small bowel obstruction. He is admitted to [facility] service with surgery consultation.  Assessment and Plan: Partial small bowel obstruction- He presented with nausea, vomiting and abdominal pain. Gen surgery follow up appreciated. NG tube  removed 07/30/24. Repeat KUB with contrast in colon, continue conservative management. Continue antiemetics, pain control. Started liquid diet. Advance diet by tomorrow to soft and plan to dc.  BPH: Continue flomax , finasteride .  Essential hypertension- Continue amlodipine , losartan .  Trigeminal neuralgia. Continue Tegretol , gabapentin .      Out of bed to chair. Incentive spirometry. Nursing supportive care. Fall, aspiration precautions. Diet:  Diet Orders (From admission, onward)     Start     Ordered   07/31/24 0827  Diet full liquid Room service appropriate? Yes; Fluid consistency: Thin  Diet effective now       Question Answer Comment  Room service appropriate? Yes   Fluid consistency: Thin      07/31/24 0827           DVT prophylaxis:  heparin  injection 5,000 Units Start: 07/28/24 1700  Level of care: Telemetry Medical   Code Status: Full Code  Subjective: Patient is seen and examined today morning. He is sitting in chair. Had 2 bowel movements yesterday. NG tube removed. Denies nausea, abdominal pain. Tolerating clears.  Physical Exam: Vitals:   07/30/24 1523 07/30/24 2054 07/31/24 0500 07/31/24 0852  BP: (!) 153/85 (!) 144/85 (!) 146/91 125/81  Pulse: 84 86 79 79  Resp: 16 18 16 17   Temp: 98 F (36.7 C) (!) 97.4 F (36.3 C) 98.1 F (36.7 C) 97.7 F (36.5 C)  TempSrc: Oral Oral Oral Oral  SpO2: 99% 97% 94% 97%  Weight:   102.5 kg   Height:        General - Elderly Caucasian male, no apparent distress HEENT - PERRLA, EOMI, atraumatic head. Lung - Clear, basal rales, no rhonchi, wheezes. Heart - S1, S2 heard, no murmurs, rubs, trace pedal edema. Abdomen - Soft, left upper tender, bowel sounds good Neuro - Alert, awake and oriented x 3, non focal exam. Skin - Warm and dry.  Data Reviewed:      Latest Ref Rng & Units 07/31/2024   10:46 AM 07/29/2024    3:15 AM 07/28/2024   12:03 PM  CBC  WBC 4.0 - 10.5 K/uL 7.0  10.8    Hemoglobin 13.0 - 17.0 g/dL 84.5  82.6  82.2   Hematocrit 39.0 - 52.0 % 44.6  49.3  52.0   Platelets 150 - 400 K/uL 142  184        Latest Ref Rng & Units 07/31/2024   10:46 AM 07/29/2024    3:15 AM 07/28/2024   12:03 PM  BMP  Glucose  70 - 99 mg/dL 867  882  852   BUN 8 - 23 mg/dL 15  11  8    Creatinine 0.61 - 1.24 mg/dL 9.03  8.88  8.99   Sodium 135 - 145 mmol/L 135  133  132   Potassium 3.5 - 5.1 mmol/L 3.9  4.3  4.2   Chloride 98 - 111 mmol/L 99  93  94   CO2 22 - 32 mmol/L 27  29    Calcium  8.9 - 10.3 mg/dL 8.9  9.1     DG Abd Portable 1V Result Date: 07/29/2024 CLINICAL DATA:  Small bowel obstruction EXAM: PORTABLE ABDOMEN - 1 VIEW COMPARISON:  07/29/2024 FINDINGS: An enteric tube is present with tip projecting over the mid abdomen consistent with location in the upper  stomach. Surgical clips in the upper abdomen. Mesh in the mid abdomen consistent with prior ventral hernia repair. Scattered dilated gas-filled small bowel. Gas-filled nondistended colon. Contrast material is suggested in the colon. This suggests no evidence of complete small bowel obstruction. Residual contrast material demonstrated in the bladder. No radiopaque stones. Degenerative changes in the spine and hips. Lung bases are clear. IMPRESSION: Mild gaseous distention of small bowel. Contrast material is seen in the colon suggesting that this does not represent complete small bowel obstruction. Electronically Signed   By: Elsie Gravely M.D.   On: 07/29/2024 22:03    Family Communication: Discussed with patient, wife at bedside, understand and agree. All questions answered.  Disposition: Status is: Inpatient Remains inpatient appropriate because: advance diet, follow bowel function.  Planned Discharge Destination: Home with Home Health     Time spent: 43 minutes  Author: Concepcion Riser, MD 07/31/2024 3:29 PM Secure chat 7am to 7pm For on call review [email or web address].

## 2024-07-31 NOTE — Progress Notes (Signed)
 Progress Note     Subjective: Patient denies abdominal pain. Reports that he tolerated clear liquids without nausea, vomiting, or increased distention. Reports 2 bowel movements yesterday evening without concerns of blood in stool. Reports flatulence.  ROS  All negative with the exception of above.  Objective: Vital signs in last 24 hours: Temp:  [97.4 F (36.3 C)-98.1 F (36.7 C)] 98.1 F (36.7 C) (09/25 0500) Pulse Rate:  [79-86] 79 (09/25 0500) Resp:  [16-18] 16 (09/25 0500) BP: (144-153)/(85-91) 146/91 (09/25 0500) SpO2:  [94 %-99 %] 94 % (09/25 0500) Weight:  [102.5 kg] 102.5 kg (09/25 0500) Last BM Date : 07/30/24  Intake/Output from previous day: 09/24 0701 - 09/25 0700 In: 772.6 [P.O.:100; I.V.:672.6] Out: -  Intake/Output this shift: No intake/output data recorded.  PE: General: Pleasant male who is laying in bed in NAD. HEENT: Head is normocephalic, atraumatic.  Sclera are noninjected. Conjunctiva anicteric.  Heart: HR normal. Lungs: Respiratory effort nonlabored. Abd: Soft, ND, NT. No rebound tenderness or guarding. Skin: Warm and dry. Psych: A&Ox3 with an appropriate affect.    Lab Results:  Recent Labs    07/28/24 1125 07/28/24 1203 07/29/24 0315  WBC 10.2  --  10.8*  HGB 17.9* 17.7* 17.3*  HCT 51.0 52.0 49.3  PLT 198  --  184   BMET Recent Labs    07/28/24 1125 07/28/24 1203 07/29/24 0315  NA 133* 132* 133*  K 3.9 4.2 4.3  CL 93* 94* 93*  CO2 28  --  29  GLUCOSE 141* 147* 117*  BUN 7* 8 11  CREATININE 1.04 1.00 1.11  CALCIUM  9.3  --  9.1   PT/INR No results for input(s): LABPROT, INR in the last 72 hours. CMP     Component Value Date/Time   NA 133 (L) 07/29/2024 0315   NA 132 (L) 12/28/2023 1237   K 4.3 07/29/2024 0315   CL 93 (L) 07/29/2024 0315   CO2 29 07/29/2024 0315   GLUCOSE 117 (H) 07/29/2024 0315   GLUCOSE 118 (H) 10/25/2006 1414   BUN 11 07/29/2024 0315   BUN 10 12/28/2023 1237   CREATININE 1.11 07/29/2024  0315   CREATININE 0.97 12/31/2012 1108   CALCIUM  9.1 07/29/2024 0315   CALCIUM  9.3 01/23/2012 1045   PROT 6.9 07/29/2024 0315   ALBUMIN 3.6 07/29/2024 0315   AST 15 07/29/2024 0315   ALT 14 07/29/2024 0315   ALKPHOS 77 07/29/2024 0315   BILITOT 0.8 07/29/2024 0315   GFRNONAA >60 07/29/2024 0315   GFRAA >60 12/26/2019 0722   Lipase     Component Value Date/Time   LIPASE 13 07/28/2024 1125       Studies/Results: DG Abd Portable 1V Result Date: 07/29/2024 CLINICAL DATA:  Small bowel obstruction EXAM: PORTABLE ABDOMEN - 1 VIEW COMPARISON:  07/29/2024 FINDINGS: An enteric tube is present with tip projecting over the mid abdomen consistent with location in the upper stomach. Surgical clips in the upper abdomen. Mesh in the mid abdomen consistent with prior ventral hernia repair. Scattered dilated gas-filled small bowel. Gas-filled nondistended colon. Contrast material is suggested in the colon. This suggests no evidence of complete small bowel obstruction. Residual contrast material demonstrated in the bladder. No radiopaque stones. Degenerative changes in the spine and hips. Lung bases are clear. IMPRESSION: Mild gaseous distention of small bowel. Contrast material is seen in the colon suggesting that this does not represent complete small bowel obstruction. Electronically Signed   By: Elsie Mannie HERO.D.  On: 07/29/2024 22:03    Anti-infectives: Anti-infectives (From admission, onward)    None        Assessment/Plan SBO - CT from 9/22 showed broad-based ventral abdominal wall hernia containing small bowel and transverse colon. Dilated fluid-filled small bowel with transition zone in the mid abdomen and decompressed terminal ileum suggesting small bowel obstruction. Transition zone likely involves the hernia. Mild mesenteric edema and free fluid without loculation. No bowel pneumatosis or wall thickening. Colonic diverticulosis without definitive evidence of diverticulitis. Mild  pericolonic infiltration is likely related mesenteric edema. - 8 hour delay film showed small bowel obstruction with dilated small bowel loops in the left abdomen with enteric contrast within small bowel loops. No contrast material within the distal small bowel or colon at this time. - 24 hour film showed mild gaseous distention of small bowel. Contrast material is seen in the colon suggesting that this does not represent complete small bowel obstruction. - Afebrile. Hypertensive. - Labs from 9/23: WBC 10.8; HGB 17.3. Labs pending for today. - NGT removed 07/30/2024. - Denies pain. Denies n/v. Having bowel function. Tolerating CLD. Will advance to FLD. - Keep K >=4, Phos >= 3, Mg >= 2 and mobilize for bowel function. - No surgical intervention planned for this time.     FEN - CLD and will advance to FLD; IVF per admitting  VTE - heparin  injection ID - None currently    LOS: 2 days   I reviewed nursing notes, specialist notes, hospitalist notes, last 24 h vitals and pain scores, last 48 h intake and output, last 24 h labs and trends, and last 24 h imaging results.  This care required moderate level of medical decision making.    Marjorie Carlyon Favre, [facility] Surgery 07/31/2024, 8:20 AM Please see Amion for pager number during day hours 7:00am-4:30pm

## 2024-07-31 NOTE — Plan of Care (Signed)

## 2024-07-31 NOTE — Plan of Care (Signed)
 Patient was able to tolerate his diet, able to ambulate in the hallway multiple times. Continue to re-educate about plans of care, patient and wife completely understand. Problem: Education: Goal: Knowledge of General Education information will improve Description: Including pain rating scale, medication(s)/side effects and non-pharmacologic comfort measures Outcome: Progressing   Problem: Health Behavior/Discharge Planning: Goal: Ability to manage health-related needs will improve Outcome: Progressing   Problem: Clinical Measurements: Goal: Ability to maintain clinical measurements within normal limits will improve Outcome: Progressing Goal: Will remain free from infection Outcome: Progressing Goal: Diagnostic test results will improve Outcome: Progressing Goal: Respiratory complications will improve Outcome: Progressing Goal: Cardiovascular complication will be avoided Outcome: Progressing   Problem: Activity: Goal: Risk for activity intolerance will decrease Outcome: Progressing   Problem: Nutrition: Goal: Adequate nutrition will be maintained Outcome: Progressing   Problem: Coping: Goal: Level of anxiety will decrease Outcome: Progressing   Problem: Elimination: Goal: Will not experience complications related to bowel motility Outcome: Progressing Goal: Will not experience complications related to urinary retention Outcome: Progressing   Problem: Pain Managment: Goal: General experience of comfort will improve and/or be controlled Outcome: Progressing   Problem: Safety: Goal: Ability to remain free from injury will improve Outcome: Progressing   Problem: Skin Integrity: Goal: Risk for impaired skin integrity will decrease Outcome: Progressing

## 2024-08-01 DIAGNOSIS — G500 Trigeminal neuralgia: Secondary | ICD-10-CM

## 2024-08-01 DIAGNOSIS — E871 Hypo-osmolality and hyponatremia: Secondary | ICD-10-CM

## 2024-08-01 DIAGNOSIS — K56600 Partial intestinal obstruction, unspecified as to cause: Secondary | ICD-10-CM

## 2024-08-01 NOTE — Plan of Care (Signed)
  Problem: Clinical Measurements: Goal: Diagnostic test results will improve Outcome: Progressing   Problem: Activity: Goal: Risk for activity intolerance will decrease Outcome: Progressing   Problem: Nutrition: Goal: Adequate nutrition will be maintained Outcome: Progressing   Problem: Coping: Goal: Level of anxiety will decrease Outcome: Progressing   Problem: Pain Managment: Goal: General experience of comfort will improve and/or be controlled Outcome: Progressing

## 2024-08-01 NOTE — Care Management Important Message (Signed)
 Important Message  Patient Details  Name: [name] MRN: [id number] Date of Birth: Jan 07, 1951   Important Message Given:  Yes - Medicare IM     Jon Cruel 08/01/2024, 12:56 PM

## 2024-08-01 NOTE — TOC CM/SW Note (Signed)
 Transition of Care [facility]) - Inpatient Brief Assessment   Patient Details  Name: [name] MRN: [id number] Date of Birth: 1951/06/27  Transition of Care [facility]) CM/SW Contact:    Lauraine FORBES Saa, LCSWA Phone Number: 08/01/2024, 12:45 PM   Clinical Narrative:  12:45 PM Per chart review, patient resides at home with spouse. Patient has a PCP and insurance. Patient does not have SNF/HH/DME history. Patient's preferred pharmacy's are [facility] [place] and [facility]. No TOC needs identified at this time. TOC will continue to follow and be available to assist.  Transition of Care Asessment: Insurance and Status: Insurance coverage has been reviewed Patient has primary care physician: Yes Home environment has been reviewed: Private Residence Prior level of function:: N/A Prior/Current Home Services: No current home services Social Drivers of Health Review: SDOH reviewed no interventions necessary Readmission risk has been reviewed: Yes (Currently Green 12%) Transition of care needs: no transition of care needs at this time

## 2024-08-01 NOTE — Plan of Care (Signed)

## 2024-08-01 NOTE — Progress Notes (Signed)
 Progress Note     Subjective: Patient reports some soreness of the abdomen around his known hernia. No significant pain or new concerns. Had 1-2 Bms yesterday with flatulence. Tolerated FLD without nausea and vomiting.  ROS  All negative with the exception of above.  Objective: Vital signs in last 24 hours: Temp:  [97.6 F (36.4 C)-98.6 F (37 C)] 98.2 F (36.8 C) (09/26 0511) Pulse Rate:  [68-79] 73 (09/26 0511) Resp:  [16-18] 17 (09/26 0511) BP: (125-159)/(81-90) 159/88 (09/26 0511) SpO2:  [96 %-100 %] 96 % (09/26 0511) Weight:  [103.5 kg] 103.5 kg (09/26 0700) Last BM Date : 07/31/24 (pt said he had several small BMs today)  Intake/Output from previous day: 09/25 0701 - 09/26 0700 In: 900 [P.O.:900] Out: -  Intake/Output this shift: No intake/output data recorded.  PE: General: Pleasant male who is laying in bed in NAD. HEENT: Head is normocephalic, atraumatic.  Sclera are noninjected. Conjunctiva anicteric.  Heart: HR normal. Lungs: Respiratory effort nonlabored. Abd: Soft, ND. Mild tenderness to palpation of epigastric region. No rebound tenderness or guarding. Skin: Warm and dry. Psych: A&Ox3 with an appropriate affect.    Lab Results:  Recent Labs    07/31/24 1046  WBC 7.0  HGB 15.4  HCT 44.6  PLT 142*   BMET Recent Labs    07/31/24 1046  NA 135  K 3.9  CL 99  CO2 27  GLUCOSE 132*  BUN 15  CREATININE 0.96  CALCIUM  8.9   PT/INR No results for input(s): LABPROT, INR in the last 72 hours. CMP     Component Value Date/Time   NA 135 07/31/2024 1046   NA 132 (L) 12/28/2023 1237   K 3.9 07/31/2024 1046   CL 99 07/31/2024 1046   CO2 27 07/31/2024 1046   GLUCOSE 132 (H) 07/31/2024 1046   GLUCOSE 118 (H) 10/25/2006 1414   BUN 15 07/31/2024 1046   BUN 10 12/28/2023 1237   CREATININE 0.96 07/31/2024 1046   CREATININE 0.97 12/31/2012 1108   CALCIUM  8.9 07/31/2024 1046   CALCIUM  9.3 01/23/2012 1045   PROT 6.6 07/31/2024 1046   ALBUMIN  3.5 07/31/2024 1046   AST 63 (H) 07/31/2024 1046   ALT 55 (H) 07/31/2024 1046   ALKPHOS 71 07/31/2024 1046   BILITOT 1.1 07/31/2024 1046   GFRNONAA >60 07/31/2024 1046   GFRAA >60 12/26/2019 0722   Lipase     Component Value Date/Time   LIPASE 13 07/28/2024 1125       Studies/Results: No results found.  Anti-infectives: Anti-infectives (From admission, onward)    None        Assessment/Plan SBO - CT from 9/22 showed broad-based ventral abdominal wall hernia containing small bowel and transverse colon. Dilated fluid-filled small bowel with transition zone in the mid abdomen and decompressed terminal ileum suggesting small bowel obstruction. Transition zone likely involves the hernia. Mild mesenteric edema and free fluid without loculation. No bowel pneumatosis or wall thickening. Colonic diverticulosis without definitive evidence of diverticulitis. Mild pericolonic infiltration is likely related mesenteric edema. - 8 hour delay film showed small bowel obstruction with dilated small bowel loops in the left abdomen with enteric contrast within small bowel loops. No contrast material within the distal small bowel or colon at this time. - 24 hour film showed mild gaseous distention of small bowel. Contrast material is seen in the colon suggesting that this does not represent complete small bowel obstruction. - Afebrile. Hypertensive. - Labs from 9/24: WBC 7.0;  HGB 15.4. - NGT removed 07/30/2024. - Denies pain. Denies n/v. Having bowel function. Tolerating FLD. Will advance to Softs. - Keep K >=4, Phos >= 3, Mg >= 2 and mobilize for bowel function. - No surgical intervention planned for this time. If patient tolerates soft diet, he will be fit for discharge from a general surgery standpoint once medically cleared. Please call for further questions or concerns.     FEN - Soft; IVF per admitting  VTE - Heparin  injection ID - None currently    LOS: 3 days   I reviewed nursing  notes, hospitalist notes, last 24 h vitals and pain scores, last 48 h intake and output, last 24 h labs and trends, and last 24 h imaging results.  This care required moderate level of medical decision making.    Marjorie Carlyon Favre, [facility] Surgery 08/01/2024, 7:52 AM Please see Amion for pager number during day hours 7:00am-4:30pm

## 2024-08-01 NOTE — Discharge Summary (Signed)
 Physician Discharge Summary   Patient: [name] MRN: [id number] DOB: 06-Jan-1951  Admit date:     07/28/2024  Discharge date: 08/01/2024  Discharge Physician: Concepcion Riser   PCP: Norleen Lynwood ORN, MD   Recommendations at discharge:    PCP follow up in 1 week.  Discharge Diagnoses: Principal Problem:   Nausea and vomiting Active Problems:   Essential hypertension   Trigeminal neuralgia, s/p NSU, followed by Dr. Unice   Hyponatremia   Partial small bowel obstruction (HCC)  Resolved Problems:   * No resolved hospital problems. *  Hospital Course: Sriyan Cayce Quezada is a 73 y.o. male with past medical history  of allergies to Dilantin , Dilaudid , morphine , sulfa, ACE inhibitor, Augmentin, codeine, obstructive chronic bronchitis, BPH, essential hypertension, GERD, trigeminal neuralgia, anxiety and depression, hiatal hernia,, anemia, history of pneumonia, enteritis, small bowel obstruction x 4 episodes in the past 3 years, B12 deficiency, hyperlipidemia, orthostatic dizziness, history of sinus infections,--comes today with nausea vomiting and abdominal pain that started earlier in the morning today. CT abdomen/ pelvis showed - ventral abdominal hernia and dilated fluid-filled small bowel with transition zone in midabdomen decompressed terminal ileum suggestive of small bowel obstruction. He is admitted to [facility] service with surgery consultation.   Assessment and Plan: Partial small bowel obstruction- He presented with nausea, vomiting and abdominal pain. Gen surgery follow up appreciated. Recommended no surgical intervention. NG tube  removed 07/30/24. Repeat KUB with contrast in colon, continued conservative management. Condition improved with antiemetics, pain control.  Started liquid diet and advances diet gradually to soft. He tolerated well, no nausea, abdominal pain. Stable to be discharged home. I advised PCP follow up and also suggested to follow his surgeon  as outpatient. Patient and wife understand and agree.  Hyponatremia- chronic, stable.   BPH: Continue flomax , finasteride .   Essential hypertension- Continue amlodipine , losartan .   Trigeminal neuralgia. Continue Tegretol , gabapentin .  B12 deficiency- Outpatient repletion, follow PCP.      Consultants: Gen surgery Procedures performed: none  Disposition: Home Diet recommendation:  Discharge Diet Orders (From admission, onward)     Start     Ordered   08/01/24 0000  Diet - low sodium heart healthy        08/01/24 1044           Cardiac diet DISCHARGE MEDICATION: Allergies as of 08/01/2024       Reactions   Dilantin  [phenytoin ] Other (See Comments)   bradycardia   Dilaudid  [hydromorphone  Hcl] Nausea And Vomiting   Morphine  Nausea And Vomiting   Sulfa Antibiotics Hives   Ace Inhibitors Cough   Augmentin [amoxicillin -pot Clavulanate] Nausea And Vomiting   Codeine Nausea And Vomiting        Medication List     STOP taking these medications    dexlansoprazole  60 MG capsule Commonly known as: DEXILANT        TAKE these medications    acetaminophen  325 MG tablet Commonly known as: TYLENOL  Take 2 tablets (650 mg total) by mouth every 6 (six) hours as needed for mild pain, moderate pain or fever.   Align 4 MG Caps Take 4 mg by mouth daily.   ALPRAZolam  1 MG tablet Commonly known as: XANAX  TAKE 1 TABLET BY MOUTH FOUR TIMES DAILY AS NEEDED FOR ANXIETY   amLODipine  2.5 MG tablet Commonly known as: NORVASC  Take 1 tablet (2.5 mg total) by mouth daily.   carbamazepine  100 MG 12 hr tablet Commonly known as: TEGRETOL  XR Take 100-200 mg by mouth See  admin instructions. 100mg  in the morning and 200mg  at night.   cyanocobalamin  1000 MCG/ML injection Commonly known as: VITAMIN B12 INJECT 1 ML every 7 days   esomeprazole  40 MG capsule Commonly known as: NEXIUM  Take 1 capsule (40 mg total) by mouth 2 (two) times daily before a meal.   famotidine  20 MG  tablet Commonly known as: PEPCID  Take 1 tablet (20 mg total) by mouth at bedtime.   finasteride  5 MG tablet Commonly known as: PROSCAR  Take 1 tablet (5 mg total) by mouth daily.   gabapentin  400 MG capsule Commonly known as: NEURONTIN  Take 400-800 mg by mouth See admin instructions. Taking 400mg  at 0800, 1400, then 2 capsules (800mg ) at bedtime.   losartan -hydrochlorothiazide  50-12.5 MG tablet Commonly known as: HYZAAR Take 1 tablet by mouth daily.   polyethylene glycol powder 17 GM/SCOOP powder Commonly known as: GLYCOLAX /MIRALAX  DISSOLVE 1 CAPFUL IN LIQUID EVERY DAY AS NEEDED FOR CONSTIPATION   sucralfate  1 g tablet Commonly known as: CARAFATE  TAKE 1 TABLET BY MOUTH FOUR TIMES DAILY WITH meals AND AT BEDTIME (DO NOT TAKE WITHIN 4 HOURS OF ANY OTHER MEDICATIONS) Please keep your Oct 7th appointment for further refills   tamsulosin  0.4 MG Caps capsule Commonly known as: FLOMAX  Take 0.4 mg by mouth at bedtime.        Discharge Exam: Filed Weights   07/30/24 0500 07/31/24 0500 08/01/24 0700  Weight: 102.2 kg 102.5 kg 103.5 kg      08/01/2024    9:10 AM 08/01/2024    7:00 AM 08/01/2024    5:11 AM  Vitals with BMI  Weight  228 lbs 3 oz   BMI  30.94   Systolic 137  159  Diastolic 88  88  Pulse 66  73   General - Elderly Caucasian male, no apparent distress HEENT - PERRLA, EOMI, atraumatic head. Lung - Clear, basal rales, no rhonchi, wheezes. Heart - S1, S2 heard, no murmurs, rubs, trace pedal edema. Abdomen - Soft, non tender, old surgical midline scar, bowel sounds good Neuro - Alert, awake and oriented x 3, non focal exam. Skin - Warm and dry.   Condition at discharge: stable  The results of significant diagnostics from this hospitalization (including imaging, microbiology, ancillary and laboratory) are listed below for reference.   Imaging Studies: DG Abd Portable 1V Result Date: 07/29/2024 CLINICAL DATA:  Small bowel obstruction EXAM: PORTABLE ABDOMEN - 1  VIEW COMPARISON:  07/29/2024 FINDINGS: An enteric tube is present with tip projecting over the mid abdomen consistent with location in the upper stomach. Surgical clips in the upper abdomen. Mesh in the mid abdomen consistent with prior ventral hernia repair. Scattered dilated gas-filled small bowel. Gas-filled nondistended colon. Contrast material is suggested in the colon. This suggests no evidence of complete small bowel obstruction. Residual contrast material demonstrated in the bladder. No radiopaque stones. Degenerative changes in the spine and hips. Lung bases are clear. IMPRESSION: Mild gaseous distention of small bowel. Contrast material is seen in the colon suggesting that this does not represent complete small bowel obstruction. Electronically Signed   By: Elsie Gravely M.D.   On: 07/29/2024 22:03   DG Abd Portable 1V-Small Bowel Obstruction Protocol-initial, 8 hr delay Result Date: 07/29/2024 EXAM: 1 VIEW XRAY OF THE ABDOMEN 07/29/2024 05:48:50 AM COMPARISON: 07/28/2024 CLINICAL HISTORY: SBO,DELAY FILM; ROVER FINDINGS: LINES, TUBES AND DEVICES: Enteric tube in place with tip at the level of the stomach. BOWEL: Dilated small bowel loops in left abdomen with some enteric contrast within  small bowel loops. No enteric contrast material within the distal small bowel or colon. SOFT TISSUES: No opaque urinary calculi. Previous herniorrhaphy tacks in left abdomen. BONES: No acute osseous abnormality. IMPRESSION: 1. Small bowel obstruction with dilated small bowel loops in the left abdomen with enteric contrast within small bowel loops. No contrast material within the distal small bowel or colon at this time. 2. Enteric tube with tip in the stomach. Electronically signed by: Waddell Calk MD 07/29/2024 06:04 AM EDT RP Workstation: [id number]   DG Abd Portable 1V-Small Bowel Protocol-Position Verification Result Date: 07/28/2024 CLINICAL DATA:  Confirm NG tube placement EXAM: PORTABLE ABDOMEN - 1 VIEW  COMPARISON:  CT abdomen and pelvis 07/28/2024 FINDINGS: Limited field of view for tube placement verification purposes. An enteric tube is present with tip projecting over the mid abdomen consistent with location in the distal stomach. Surgical clips in the upper abdomen. Residual contrast material in the kidneys. Mildly gas distended upper abdominal small bowel may indicate obstruction. Linear atelectasis in the left lung base. Degenerative changes in the spine. IMPRESSION: Enteric tube tip projects over the mid abdomen consistent with location in the distal stomach. Mild gaseous distention of small bowel may indicate obstruction. Electronically Signed   By: Elsie Gravely M.D.   On: 07/28/2024 18:52   CT ABDOMEN PELVIS W CONTRAST Result Date: 07/28/2024 CLINICAL DATA:  Bowel obstruction suspected EXAM: CT ABDOMEN AND PELVIS WITH CONTRAST TECHNIQUE: Multidetector CT imaging of the abdomen and pelvis was performed using the standard protocol following bolus administration of intravenous contrast. RADIATION DOSE REDUCTION: This exam was performed according to the departmental dose-optimization program which includes automated exposure control, adjustment of the mA and/or kV according to patient size and/or use of iterative reconstruction technique. CONTRAST:  75mL OMNIPAQUE  IOHEXOL  350 MG/ML SOLN COMPARISON:  05/15/2021 FINDINGS: Lower chest: Motion artifact limits evaluation. Fine interstitial reticulonodular changes with peribronchial distribution possibly representing respiratory bronchiolitis. Hepatobiliary: No focal liver abnormality is seen. Status post cholecystectomy. No biliary dilatation. Pancreas: Fatty infiltration the pancreas. No focal lesion or collection identified. Spleen: Normal in size without focal abnormality. Adrenals/Urinary Tract: No adrenal gland nodules. 2 mm stone in the upper pole left kidney. No hydronephrosis or hydroureter. Subcentimeter renal cysts. No imaging follow-up is  indicated. Bladder wall is mildly thickened possibly due to under distension or outlet obstruction. Correlate with urinalysis if cystitis is suspected. Stomach/Bowel: Stomach is decompressed. Surgical clips around the stomach and at the EG junction. Broad-based anterior abdominal wall hernia with scarring in the anterior abdominal wall. The hernia contains a portion of the transverse colon as well as several loops of small bowel. Small bowel are mildly dilated and fluid-filled with transition zone in the mid abdomen, likely at the level of the hernia. Terminal ileum is decompressed. This appearance is consistent with bowel obstruction. Mild mesenteric edema. No bowel wall thickening or pneumatosis. Scattered stool throughout the colon without colonic distention. Diverticulosis of the sigmoid colon. Mild stranding around the sigmoid colon possibly representing early acute diverticulitis although more likely related to the mesenteric edema. No loculated collections. Appendix is normal. Vascular/Lymphatic: Aortic atherosclerosis. No enlarged abdominal or pelvic lymph nodes. Reproductive: Prostate gland is enlarged. Other: Scarring in the anterior abdominal wall, likely postoperative. Small amount of free fluid in the abdomen is likely reactive. No free air. Musculoskeletal: Degenerative changes in the spine. No destructive bone lesions. IMPRESSION: 1. Broad-based ventral abdominal wall hernia containing small bowel and transverse colon. 2. Dilated fluid-filled small bowel with transition zone in the  mid abdomen and decompressed terminal ileum suggesting small bowel obstruction. Transition zone likely involves the hernia. 3. Mild mesenteric edema and free fluid without loculation. No bowel pneumatosis or wall thickening. 4. Colonic diverticulosis without definitive evidence of diverticulitis. Mild pericolonic infiltration is likely related mesenteric edema. 5. Aortic atherosclerosis. 6. Nonobstructing renal stone. 7.  Bladder wall thickening may be due to outlet obstruction or under distention. 8. Enlarged prostate gland. 9. Peribronchial changes in the lung bases may represent respiratory bronchiolitis. Electronically Signed   By: Elsie Gravely M.D.   On: 07/28/2024 15:39    Microbiology: Results for orders placed or performed during the hospital encounter of 12/24/20  SARS CORONAVIRUS 2 (TAT 6-24 HRS) Nasopharyngeal Nasopharyngeal Swab     Status: None   Collection Time: 12/24/20 11:24 AM   Specimen: Nasopharyngeal Swab  Result Value Ref Range Status   SARS Coronavirus 2 NEGATIVE NEGATIVE Final    Comment: (NOTE) SARS-CoV-2 target nucleic acids are NOT DETECTED.  The SARS-CoV-2 RNA is generally detectable in upper and lower respiratory specimens during the acute phase of infection. Negative results do not preclude SARS-CoV-2 infection, do not rule out co-infections with other pathogens, and should not be used as the sole basis for treatment or other patient management decisions. Negative results must be combined with clinical observations, patient history, and epidemiological information. The expected result is Negative.  Fact Sheet for Patients: HairSlick.no  Fact Sheet for Healthcare Providers: quierodirigir.com  This test is not yet approved or cleared by the [place]  FDA and  has been authorized for detection and/or diagnosis of SARS-CoV-2 by FDA under an Emergency Use Authorization (EUA). This EUA will remain  in effect (meaning this test can be used) for the duration of the COVID-19 declaration under Se ction 564(b)(1) of the Act, 21 U.S.C. section 360bbb-3(b)(1), unless the authorization is terminated or revoked sooner.  Performed at [facility], [address] [place], [place] [address]     Labs: CBC: Recent Labs  Lab 07/28/24 1125 07/28/24 1203 07/29/24 0315 07/31/24 1046  WBC 10.2  --  10.8* 7.0  HGB  17.9* 17.7* 17.3* 15.4  HCT 51.0 52.0 49.3 44.6  MCV 88.7  --  87.9 89.9  PLT 198  --  184 142*   Basic Metabolic Panel: Recent Labs  Lab 07/28/24 1125 07/28/24 1153 07/28/24 1203 07/29/24 0315 07/31/24 1046  NA 133*  --  132* 133* 135  K 3.9  --  4.2 4.3 3.9  CL 93*  --  94* 93* 99  CO2 28  --   --  29 27  GLUCOSE 141*  --  147* 117* 132*  BUN 7*  --  8 11 15   CREATININE 1.04  --  1.00 1.11 0.96  CALCIUM  9.3  --   --  9.1 8.9  MG  --  1.9  --  2.1  --    Liver Function Tests: Recent Labs  Lab 07/28/24 1125 07/29/24 0315 07/31/24 1046  AST 20 15 63*  ALT 15 14 55*  ALKPHOS 85 77 71  BILITOT 0.8 0.8 1.1  PROT 7.4 6.9 6.6  ALBUMIN 4.0 3.6 3.5   CBG: No results for input(s): GLUCAP in the last 168 hours.  Discharge time spent: 35 minutes.  Signed: Concepcion Riser, MD [facility] 08/02/2024

## 2024-08-04 ENCOUNTER — Telehealth: Payer: Self-pay

## 2024-08-04 NOTE — Transitions of Care (Post Inpatient/ED Visit) (Signed)
 08/04/2024  Name: [name] MRN: [id number] DOB: Feb 10, 1951  Today's TOC FU Call Status: Today's TOC FU Call Status:: Successful TOC FU Call Completed TOC FU Call Complete Date: 08/04/24 Patient's Name and Date of Birth confirmed.  Transition Care Management Follow-up Telephone Call Date of Discharge: 08/01/24 Discharge Facility: [facility]) Type of Discharge: Inpatient Admission Primary Inpatient Discharge Diagnosis:: N/V How have you been since you were released from the hospital?: Better Any questions or concerns?: No  Items Reviewed: Did you receive and understand the discharge instructions provided?: Yes Medications obtained,verified, and reconciled?: Yes (Medications Reviewed) Any new allergies since your discharge?: No Dietary orders reviewed?: Yes Do you have support at home?: Yes People in Home [RPT]: spouse  Medications Reviewed Today: Medications Reviewed Today     Reviewed by Emmitt Pan, LPN (Licensed Practical Nurse) on 08/04/24 at 1039  Med List Status: <None>   Medication Order Taking? Sig Documenting Provider Last Dose Status Informant  acetaminophen  (TYLENOL ) 325 MG tablet [id number] Yes Take 2 tablets (650 mg total) by mouth every 6 (six) hours as needed for mild pain, moderate pain or fever. Vicci Burnard SAUNDERS, PA-C  Active Self, Spouse/Significant Other, Pharmacy Records  ALPRAZolam  (XANAX ) 1 MG tablet [id number] Yes TAKE 1 TABLET BY MOUTH FOUR TIMES DAILY AS NEEDED FOR ANXIETY Norleen Lynwood ORN, MD  Active Self, Spouse/Significant Other, Pharmacy Records  amLODipine  (NORVASC ) 2.5 MG tablet [id number] Yes Take 1 tablet (2.5 mg total) by mouth daily. Norleen Lynwood ORN, MD  Active Self, Spouse/Significant Other, Pharmacy Records  carbamazepine  (TEGRETOL  XR) 100 MG 12 hr tablet [id number] Yes Take 100-200 mg by mouth See admin instructions. 100mg  in the morning and 200mg  at night. [provider]  Active Self, Spouse/Significant Other,  Pharmacy Records           Med Note ROZENA, SEBASTIAN   Mon Jul 28, 2024  4:51 PM) Pt reports emesis immediately after taking  cyanocobalamin  (VITAMIN B12) 1000 MCG/ML injection [id number] Yes INJECT 1 ML every 7 days Norleen Lynwood ORN, MD  Active Self, Spouse/Significant Other, Pharmacy Records  esomeprazole  (NEXIUM ) 40 MG capsule [id number] Yes Take 1 capsule (40 mg total) by mouth 2 (two) times daily before a meal. Cara Elida HERO, NP  Active Self, Spouse/Significant Other, Pharmacy Records  famotidine  (PEPCID ) 20 MG tablet [id number] Yes Take 1 tablet (20 mg total) by mouth at bedtime. Norleen Lynwood ORN, MD  Active Self, Spouse/Significant Other, Pharmacy Records  finasteride  (PROSCAR ) 5 MG tablet [id number] Yes Take 1 tablet (5 mg total) by mouth daily. Norleen Lynwood ORN, MD  Active Self, Spouse/Significant Other, Pharmacy Records  gabapentin  (NEURONTIN ) 400 MG capsule [id number] Yes Take 400-800 mg by mouth See admin instructions. Taking 400mg  at 0800, 1400, then 2 capsules (800mg ) at bedtime. [provider]  Active Self, Spouse/Significant Other, Pharmacy Records           Med Note ROZENA, SEBASTIAN   Mon Jul 28, 2024  5:00 PM) Pt reports emesis immediately after taking  losartan -hydrochlorothiazide  (HYZAAR) 50-12.5 MG tablet [id number] Yes Take 1 tablet by mouth daily. [provider]  Active Self, Spouse/Significant Other, Pharmacy Records  polyethylene glycol powder (GLYCOLAX /MIRALAX ) powder [id number] Yes DISSOLVE 1 CAPFUL IN LIQUID EVERY DAY AS NEEDED FOR CONSTIPATION Luke Chiquita SAUNDERS, DO  Active Self, Spouse/Significant Other, Pharmacy Records  Probiotic Product (ALIGN) 4 MG CAPS [id number] Yes Take 4 mg by mouth daily. [provider]  Active Self, Spouse/Significant Other, Pharmacy Records  sucralfate  (CARAFATE ) 1 g tablet  [id number] Yes TAKE 1 TABLET BY MOUTH FOUR TIMES DAILY WITH meals AND AT BEDTIME (DO NOT TAKE WITHIN 4 HOURS OF ANY OTHER MEDICATIONS) Please keep your Oct  7th appointment for further refills Cara Elida HERO, NP  Active Self, Spouse/Significant Other, Pharmacy Records  tamsulosin  (FLOMAX ) 0.4 MG CAPS capsule [id number] Yes Take 0.4 mg by mouth at bedtime. [provider]  Active Self, Spouse/Significant Other, Pharmacy Records            Home Care and Equipment/Supplies: Were Home Health Services Ordered?: NA Any new equipment or medical supplies ordered?: NA  Functional Questionnaire: Do you need assistance with bathing/showering or dressing?: No Do you need assistance with meal preparation?: No Do you need assistance with eating?: No Do you have difficulty maintaining continence: No Do you need assistance with getting out of bed/getting out of a chair/moving?: No Do you have difficulty managing or taking your medications?: No  Follow up appointments reviewed: PCP Follow-up appointment confirmed?: Yes Date of PCP follow-up appointment?: 08/05/24 Follow-up Provider: [facility] Follow-up appointment confirmed?: Yes Date of Specialist follow-up appointment?: 08/11/24 Follow-Up Specialty Provider:: GI Do you need transportation to your follow-up appointment?: No Do you understand care options if your condition(s) worsen?: Yes-patient verbalized understanding    SIGNATURE Julian Lemmings, LPN [facility] Nurse Health Advisor Direct Dial [phone]

## 2024-08-05 ENCOUNTER — Encounter: Payer: Self-pay | Admitting: Internal Medicine

## 2024-08-05 ENCOUNTER — Ambulatory Visit: Admitting: Internal Medicine

## 2024-08-05 ENCOUNTER — Ambulatory Visit: Payer: Self-pay | Admitting: Internal Medicine

## 2024-08-05 VITALS — BP 128/80 | HR 66 | Temp 98.7°F | Ht 72.0 in | Wt 231.0 lb

## 2024-08-05 DIAGNOSIS — K56600 Partial intestinal obstruction, unspecified as to cause: Secondary | ICD-10-CM | POA: Diagnosis not present

## 2024-08-05 DIAGNOSIS — R300 Dysuria: Secondary | ICD-10-CM | POA: Diagnosis not present

## 2024-08-05 LAB — URINALYSIS, ROUTINE W REFLEX MICROSCOPIC
Bilirubin Urine: NEGATIVE
Hgb urine dipstick: NEGATIVE
Ketones, ur: NEGATIVE
Nitrite: NEGATIVE
Specific Gravity, Urine: 1.01 (ref 1.000–1.030)
Total Protein, Urine: NEGATIVE
Urine Glucose: NEGATIVE
Urobilinogen, UA: 1 (ref 0.0–1.0)
pH: 7 (ref 5.0–8.0)

## 2024-08-05 NOTE — Patient Instructions (Addendum)
 Please continue all other medications as before, and refills have been done if requested.  Please have the pharmacy call with any other refills you may need.  Please continue your efforts at being more active, low cholesterol diet, and weight control.  Please keep your appointments with your specialists as you may have planned  Please go to the LAB at the blood drawing area for the tests to be done - just the urine testing today  You will be contacted by phone if any changes need to be made immediately.  Otherwise, you will receive a letter about your results with an explanation, but please check with MyChart first.  Please make an Appointment to return in Jan 23, or sooner if needed

## 2024-08-05 NOTE — Assessment & Plan Note (Signed)
 Mild, etiology unclear, for UA and culture, with treatment pending results,  to f/u any worsening symptoms or concerns

## 2024-08-05 NOTE — Assessment & Plan Note (Signed)
 Overall doing well,  to f/u any worsening symptoms or concerns

## 2024-08-05 NOTE — Progress Notes (Signed)
 Patient ID: [name], male   DOB: [birth year], 73 y.o.   MRN: [id number]        Chief Complaint: follow up post hospn with  partial SBO sept 22 - 26       HPI:  [name] is a 73 y.o. male here overall doing ok,  Pt denies chest pain, increased sob or doe, wheezing, orthopnea, PND, increased LE swelling, palpitations, dizziness or syncope.   Pt denies polydipsia, polyuria, or new focal neuro s/s.    Pt denies fever, wt loss, night sweats, loss of appetite, or other constitutional symptoms  Denies worsening reflux, abd pain, dysphagia, n/v, bowel change or blood.  Does have mild worsening urinary symptom of dysuria, but no frequency, urgency, flank pain, hematuria or n/v, fever, chills.       Wt Readings from Last 3 Encounters:  08/05/24 231 lb (104.8 kg)  08/01/24 228 lb 2.8 oz (103.5 kg)  07/10/24 234 lb (106.1 kg)   BP Readings from Last 3 Encounters:  08/05/24 128/80  08/01/24 137/88  07/10/24 (!) 142/90         Past Medical History:  Diagnosis Date   Allergic rhinitis    Anemia, pernicious    Anxiety and depression    sees Dr. Vincente   Arthritis    B12 deficiency 01/23/2020   Catheter-associated urinary tract infection    Diverticulosis of colon    Elevated PSA    and hypogonadism, sees urologist   GERD (gastroesophageal reflux disease)    hiatal hernia   Hx of blood transfusion reaction 1999   Hyperlipidemia    Hyperparathyroidism    Hyperplastic colon polyp    Hypertension    acei causes cough   IBS (irritable bowel syndrome)    Nephrolithiasis    Obstructive chronic bronchitis without exacerbation, followed by Dr. Brien 03/04/2009   ONO RA  Normal 07/28/11 6 min walk >583m no desat PFTs 08/08/11: DLCO 70% TLC 80%  FeV1 80%   Fef 25 75 56% with significant improvement after BD    Peritonitis (HCC)    after surgery in 1999   Pneumonia    PONV (postoperative nausea and vomiting)    Rectal fissure    Trigeminal neuralgia    has facial  asymetry   Ventral hernia    Past Surgical History:  Procedure Laterality Date   ABDOMINAL SURGERY     Multiple   BRAIN SURGERY  01/2011   Trigeminal nerve/Dr Unice   CHOLECYSTECTOMY  1981   DENTAL SURGERY  2019   tooth extraction, root canal   ESOPHAGEAL MANOMETRY N/A 04/28/2020   Procedure: ESOPHAGEAL MANOMETRY (EM);  Surgeon: Shila Gustav GAILS, MD;  Location: [facility];  Service: Endoscopy;  Laterality: N/A;   HAND SURGERY  1973   Left   HERNIA REPAIR  1999   KNEE SURGERY  1997   Left   LITHOTRIPSY     Right   NISSEN FUNDOPLICATION  1999    complicated by gastric perforation, peritonitis, ventral nernia    RHIZOTOMY Right 11/17/2014   Procedure: RHIZOTOMY TO RIGHT V2,V3;  Surgeon: Fairy Unice, MD;  Location: [facility];  Service: Neurosurgery;  Laterality: Right;  right   RHIZOTOMY Right 12/22/2014   Procedure: Right V2 and V3 trigeminal rhysolysis;  Surgeon: Fairy Unice, MD;  Location: [facility];  Service: Neurosurgery;  Laterality: Right;  Right V2 and V3 trigeminal rhysolysis   RHIZOTOMY W/ RADIOFREQUENCY ABLATION  08/2017   Dr Mindi  [facility] GASTROINTESTINAL ENDOSCOPY     VENTRAL HERNIA REPAIR  2004    reports that he quit smoking about 27 years ago. His smoking use included cigarettes. He started smoking about 47 years ago. He has a 20 pack-year smoking history. He quit smokeless tobacco use about 40 years ago.  His smokeless tobacco use included chew. He reports that he does not drink alcohol and does not use drugs. family history includes Colon polyps in his mother; Coronary artery disease in his mother; Diabetes in his brother, mother, and sister; Heart disease in his brother and brother; Hypertension in his brother. Allergies  Allergen Reactions   Dilantin  [Phenytoin ] Other (See Comments)    bradycardia   Dilaudid  [Hydromorphone  Hcl] Nausea And Vomiting   Morphine  Nausea And Vomiting   Sulfa Antibiotics Hives   Ace Inhibitors Cough   Augmentin  [Amoxicillin -Pot Clavulanate] Nausea And Vomiting   Codeine Nausea And Vomiting   Current Outpatient Medications on File Prior to Visit  Medication Sig Dispense Refill   acetaminophen  (TYLENOL ) 325 MG tablet Take 2 tablets (650 mg total) by mouth every 6 (six) hours as needed for mild pain, moderate pain or fever.     ALPRAZolam  (XANAX ) 1 MG tablet TAKE 1 TABLET BY MOUTH FOUR TIMES DAILY AS NEEDED FOR ANXIETY 120 tablet 2   amLODipine  (NORVASC ) 2.5 MG tablet Take 1 tablet (2.5 mg total) by mouth daily. 90 tablet 3   carbamazepine  (TEGRETOL  XR) 100 MG 12 hr tablet Take 100-200 mg by mouth See admin instructions. 100mg  in the morning and 200mg  at night.     cyanocobalamin  (VITAMIN B12) 1000 MCG/ML injection INJECT 1 ML every 7 days 60 mL 3   esomeprazole  (NEXIUM ) 40 MG capsule Take 1 capsule (40 mg total) by mouth 2 (two) times daily before a meal. 180 capsule 3   famotidine  (PEPCID ) 20 MG tablet Take 1 tablet (20 mg total) by mouth at bedtime. 90 tablet 3   finasteride  (PROSCAR ) 5 MG tablet Take 1 tablet (5 mg total) by mouth daily. 90 tablet 3   gabapentin  (NEURONTIN ) 400 MG capsule Take 400-800 mg by mouth See admin instructions. Taking 400mg  at 0800, 1400, then 2 capsules (800mg ) at bedtime.     losartan -hydrochlorothiazide  (HYZAAR) 50-12.5 MG tablet Take 1 tablet by mouth daily.     polyethylene glycol powder (GLYCOLAX /MIRALAX ) powder DISSOLVE 1 CAPFUL IN LIQUID EVERY DAY AS NEEDED FOR CONSTIPATION 527 g 2   Probiotic Product (ALIGN) 4 MG CAPS Take 4 mg by mouth daily.     sucralfate  (CARAFATE ) 1 g tablet TAKE 1 TABLET BY MOUTH FOUR TIMES DAILY WITH meals AND AT BEDTIME (DO NOT TAKE WITHIN 4 HOURS OF ANY OTHER MEDICATIONS) Please keep your Oct 7th appointment for further refills 150 tablet 0   tamsulosin  (FLOMAX ) 0.4 MG CAPS capsule Take 0.4 mg by mouth at bedtime.     No current facility-administered medications on file prior to visit.        ROS:  All others reviewed and  negative.  Objective        PE:  BP 128/80 (BP Location: Right Arm, Patient Position: Sitting, Cuff Size: Normal)   Pulse 66   Temp 98.7 F (37.1 C) (Oral)   Ht 6' (1.829 m)   Wt 231 lb (104.8 kg)   SpO2 97%   BMI 31.33 kg/m                 Constitutional: Pt appears in NAD  HENT: Head: NCAT.                Right Ear: External ear normal.                 Left Ear: External ear normal.                Eyes: . Pupils are equal, round, and reactive to light. Conjunctivae and EOM are normal               Nose: without d/c or deformity               Neck: Neck supple. Gross normal ROM               Cardiovascular: Normal rate and regular rhythm.                 Pulmonary/Chest: Effort normal and breath sounds without rales or wheezing.                Abd:  Soft, NT, ND, + BS, no organomegaly               Neurological: Pt is alert. At baseline orientation, motor grossly intact               Skin: Skin is warm. No rashes, no other new lesions, LE edema - none               Psychiatric: Pt behavior is normal without agitation   Micro: none  Cardiac tracings I have personally interpreted today:  none  Pertinent Radiological findings (summarize): none   Lab Results  Component Value Date   WBC 7.0 07/31/2024   HGB 15.4 07/31/2024   HCT 44.6 07/31/2024   PLT 142 (L) 07/31/2024   GLUCOSE 132 (H) 07/31/2024   CHOL 170 05/23/2024   TRIG 140.0 05/23/2024   HDL 57.40 05/23/2024   LDLCALC 84 05/23/2024   ALT 55 (H) 07/31/2024   AST 63 (H) 07/31/2024   NA 135 07/31/2024   K 3.9 07/31/2024   CL 99 07/31/2024   CREATININE 0.96 07/31/2024   BUN 15 07/31/2024   CO2 27 07/31/2024   TSH 1.69 05/23/2024   PSA 2.64 05/23/2024   HGBA1C 5.8 05/23/2024   Assessment/Plan:  [name] is a 73 y.o. White or Caucasian [1] male with  has a past medical history of Allergic rhinitis, Anemia, pernicious, Anxiety and depression, Arthritis, B12 deficiency (01/23/2020),  Catheter-associated urinary tract infection, Diverticulosis of colon, Elevated PSA, GERD (gastroesophageal reflux disease), blood transfusion reaction (1999), Hyperlipidemia, Hyperparathyroidism, Hyperplastic colon polyp, Hypertension, IBS (irritable bowel syndrome), Nephrolithiasis, Obstructive chronic bronchitis without exacerbation, followed by Dr. Brien (03/04/2009), Peritonitis (HCC), Pneumonia, PONV (postoperative nausea and vomiting), Rectal fissure, Trigeminal neuralgia, and Ventral hernia.  Partial small bowel obstruction (HCC) Overall doing well,  to f/u any worsening symptoms or concerns   Dysuria Mild, etiology unclear, for UA and culture, with treatment pending results,  to f/u any worsening symptoms or concerns  Followup: Return in about 4 months (around 11/28/2024).  Jeffrey Rush, MD 08/05/2024 2:51 PM [facility]

## 2024-08-06 LAB — URINE CULTURE: Result:: NO GROWTH

## 2024-08-12 ENCOUNTER — Ambulatory Visit: Admitting: Gastroenterology

## 2024-08-12 ENCOUNTER — Other Ambulatory Visit

## 2024-08-12 ENCOUNTER — Encounter: Payer: Self-pay | Admitting: Gastroenterology

## 2024-08-12 ENCOUNTER — Ambulatory Visit: Payer: Self-pay | Admitting: Gastroenterology

## 2024-08-12 VITALS — BP 126/78 | HR 75 | Ht 71.0 in | Wt 230.0 lb

## 2024-08-12 DIAGNOSIS — R1032 Left lower quadrant pain: Secondary | ICD-10-CM

## 2024-08-12 DIAGNOSIS — Z9049 Acquired absence of other specified parts of digestive tract: Secondary | ICD-10-CM

## 2024-08-12 DIAGNOSIS — Z8719 Personal history of other diseases of the digestive system: Secondary | ICD-10-CM | POA: Diagnosis not present

## 2024-08-12 DIAGNOSIS — K219 Gastro-esophageal reflux disease without esophagitis: Secondary | ICD-10-CM

## 2024-08-12 DIAGNOSIS — K449 Diaphragmatic hernia without obstruction or gangrene: Secondary | ICD-10-CM | POA: Diagnosis not present

## 2024-08-12 DIAGNOSIS — Z9889 Other specified postprocedural states: Secondary | ICD-10-CM

## 2024-08-12 DIAGNOSIS — Z860101 Personal history of adenomatous and serrated colon polyps: Secondary | ICD-10-CM

## 2024-08-12 LAB — COMPREHENSIVE METABOLIC PANEL WITH GFR
ALT: 9 U/L (ref 0–53)
AST: 9 U/L (ref 0–37)
Albumin: 4.1 g/dL (ref 3.5–5.2)
Alkaline Phosphatase: 66 U/L (ref 39–117)
BUN: 12 mg/dL (ref 6–23)
CO2: 31 meq/L (ref 19–32)
Calcium: 9.1 mg/dL (ref 8.4–10.5)
Chloride: 96 meq/L (ref 96–112)
Creatinine, Ser: 0.87 mg/dL (ref 0.40–1.50)
GFR: 86.42 mL/min (ref >60.00)
Glucose, Bld: 96 mg/dL (ref 70–99)
Potassium: 4.1 meq/L (ref 3.5–5.1)
Sodium: 135 meq/L (ref 135–145)
Total Bilirubin: 0.5 mg/dL (ref 0.2–1.2)
Total Protein: 6.9 g/dL (ref 6.0–8.3)

## 2024-08-12 LAB — CBC WITH DIFFERENTIAL/PLATELET
Basophils Absolute: 0 K/uL (ref 0.0–0.1)
Basophils Relative: 0.6 % (ref 0.0–3.0)
Eosinophils Absolute: 0.1 K/uL (ref 0.0–0.7)
Eosinophils Relative: 0.8 % (ref 0.0–5.0)
HCT: 45.6 % (ref 39.0–52.0)
Hemoglobin: 15.3 g/dL (ref 13.0–17.0)
Lymphocytes Relative: 28.1 % (ref 12.0–46.0)
Lymphs Abs: 2.1 K/uL (ref 0.7–4.0)
MCHC: 33.4 g/dL (ref 30.0–36.0)
MCV: 91.7 fl (ref 78.0–100.0)
Monocytes Absolute: 0.6 K/uL (ref 0.1–1.0)
Monocytes Relative: 8.2 % (ref 3.0–12.0)
Neutro Abs: 4.7 K/uL (ref 1.4–7.7)
Neutrophils Relative %: 62.3 % (ref 43.0–77.0)
Platelets: 183 K/uL (ref 150.0–400.0)
RBC: 4.98 Mil/uL (ref 4.22–5.81)
RDW: 13.6 % (ref 11.5–15.5)
WBC: 7.6 K/uL (ref 4.0–10.5)

## 2024-08-12 NOTE — Patient Instructions (Signed)
 Your provider has requested that you go to the basement level for lab work before leaving today. Press B on the elevator. The lab is located at the first door on the left as you exit the elevator.  Due to recent changes in healthcare laws, you may see the results of your imaging and laboratory studies on MyChart before your provider has had a chance to review them.  We understand that in some cases there may be results that are confusing or concerning to you. Not all laboratory results come back in the same time frame and the provider may be waiting for multiple results in order to interpret others.  Please give us  48 hours in order for your provider to thoroughly review all the results before contacting the office for clarification of your results.   You have been scheduled for an endoscopy and colonoscopy. Please follow the written instructions given to you at your visit today.  If you use inhalers (even only as needed), please bring them with you on the day of your procedure.  DO NOT TAKE 7 DAYS PRIOR TO TEST- Trulicity (dulaglutide) Ozempic, Wegovy (semaglutide) Mounjaro (tirzepatide) Bydureon Bcise (exanatide extended release)  DO NOT TAKE 1 DAY PRIOR TO YOUR TEST Rybelsus (semaglutide) Adlyxin (lixisenatide) Victoza (liraglutide) Byetta (exanatide) ___________________________________________________________________________   Thank you for trusting me with your gastrointestinal care!   Camie Furbish, PA-C  _______________________________________________________  If your blood pressure at your visit was 140/90 or greater, please contact your primary care physician to follow up on this.  _______________________________________________________  If you are age 60 or older, your body mass index should be between 23-30. Your Body mass index is 32.08 kg/m. If this is out of the aforementioned range listed, please consider follow up with your Primary Care Provider.  If you are age 58  or younger, your body mass index should be between 19-25. Your Body mass index is 32.08 kg/m. If this is out of the aformentioned range listed, please consider follow up with your Primary Care Provider.   ________________________________________________________  The [facility] providers would like to encourage you to use MYCHART to communicate with providers for non-urgent requests or questions.  Due to long hold times on the telephone, sending your provider a message by [facility] may be a faster and more efficient way to get a response.  Please allow 48 business hours for a response.  Please remember that this is for non-urgent requests.  _______________________________________________________  [facility] is using a team-based approach to care.  Your team is made up of your doctor and two to three APPS. Our APPS (Nurse Practitioners and Physician Assistants) work with your physician to ensure care continuity for you. They are fully qualified to address your health concerns and develop a treatment plan. They communicate directly with your gastroenterologist to care for you. Seeing the Advanced Practice Practitioners on your physician's team can help you by facilitating care more promptly, often allowing for earlier appointments, access to diagnostic testing, procedures, and other specialty referrals.

## 2024-08-12 NOTE — Progress Notes (Signed)
 [name] [id number] 06-02-1951   Chief Complaint: Discuss colonoscopy, abdominal pain  Referring Provider: Norleen Lynwood ORN, MD Primary GI MD: Dr. Shila  HPI: [name] is a 73 y.o. male with past medical history of  anxiety/depression, arthritis, vitamin B12 deficiency, tubulovillous adenomatous colon polyp and GERD, hiatal hernia s/p Nissen fundoplication 1999 complicated by a perforation s/p redo surgery by Dr. Gladis 1999. Past Ventral hernia repair with recurrent SBO secondary to adhesions. Past cholecystectomy.  He presents today to discuss colonoscopy.    Last seen in office 01/10/2024 by Elida Shawl, NP with complaint of intermittent flares of acid reflux described as burning up esophageal/chest pain radiating into his back.  Had upcoming coronary CTA scheduled at that time.  Plan was to proceed with cardiac workup, stop Dexilant  and Prevacid , start omeprazole  40 mg twice daily and GERD diet.  Noted to be due for repeat colonoscopy in 08/2024.  Recent hospital admission 07/28/2024 to 08/01/2024 for partial small bowel obstruction.  Noted to have had 4 small bowel obstruction episodes in the last 3 years.  Presented with nausea, vomiting, abdominal pain.  General surgery consulted and advised no surgical intervention.  NG tube removed 07/30/2024.  Repeat KUB with contrast in colon, continued conservative management.  Improved with antiemetics, pain control.  Started liquid diet and gradually advance to soft which she tolerated well.  Discharged home and suggested to follow-up with surgery as outpatient.   Patient states he has been feeling better since his hospitalization.  He has a normal appetite and is eating well.  Denies nausea, vomiting, fever, chills.  Having a bowel movement daily in the morning and denies any diarrhea, constipation, blood in his stool, or black stools.  Takes MiraLAX  daily due to having ventral hernia which he states was  recommended by surgery in the past.  Says he has had a hernia for 26 years.  States GERD symptoms are controlled on medication.  He has history of a hiatal hernia which he says moves in history of a Nissen fundoplication which has loosened.  States he intermittently gets pain in his lower sternum and has to watch what he eats.  Denies any dysphagia.  He would like to have an upper endoscopy with upcoming colonoscopy.  Reports that he has previously been diagnosed with diverticulosis and intermittently for the last 10 years has occasional LLQ abdominal pain.  States he has never had a CT scan to diagnose diverticulitis.  Has never required antibiotics.  Does not go to the hospital or seek medical attention when this pain occurs.  States he has this pain today.  Achy in nature.  No improvement with bowel movements.  States that pain typically lasts a couple days and then resolves on its own.  Denies any heart or lung problems.  Denies shortness of breath or chest pain.  Previous GI Procedures/Imaging   CT A/P 07/28/2024 1. Broad-based ventral abdominal wall hernia containing small bowel and transverse colon. 2. Dilated fluid-filled small bowel with transition zone in the mid abdomen and decompressed terminal ileum suggesting small bowel obstruction. Transition zone likely involves the hernia. 3. Mild mesenteric edema and free fluid without loculation. No bowel pneumatosis or wall thickening. 4. Colonic diverticulosis without definitive evidence of diverticulitis. Mild pericolonic infiltration is likely related mesenteric edema. 5. Aortic atherosclerosis. 6. Nonobstructing renal stone. 7. Bladder wall thickening may be due to outlet obstruction or under distention. 8. Enlarged prostate gland. 9. Peribronchial changes in the lung bases may represent  respiratory bronchiolitis.  CT coronary 01/14/2024 1. Coronary calcium  score of 144. This was 47th percentile for age-, sex, and race-matched  controls.   2. Total plaque volume 213 mm3 which is 16th percentile for age- and sex-matched controls (calcified plaque 35 mm3; non-calcified plaque 178 mm3). TPV is (moderate).   3. Normal coronary origin with right dominance.   4.  There is minimal (<25%) plaque in the LAD and OM1.  CAD-RADS 1.  EGD 08/24/2021: - LA grade B reflux esophagitis with no bleeding - Medium size hiatal hernia - Gastritis.  Biopsied. - Normal examined duodenum   Colonoscopy 08/24/2021:   - Two 4 to 10 mm polyps in the transverse colon, removed with a cold snare. Resected and retrieved. - One 8 mm polyp in the rectum, removed with a hot snare. Resected and retrieved. - Moderate diverticulosis in the sigmoid colon, in the descending colon, in the transverse colon, in the ascending colon and in the cecum. - Non-bleeding external and internal hemorrhoids. - 3 year recall colonoscopy  Path: 1. Surgical [P], gastric - gastritis bx - GASTRIC ANTRAL AND OXYNTIC MUCOSA WITH MILD CHRONIC GASTRITIS - WARTHIN STARRY STAIN IS NEGATIVE FOR HELICOBACTER PYLORI 2. Surgical [P], colon, transverse, polyp (2) - TUBULOVILLOUS ADENOMA(S) - NEGATIVE FOR HIGH-GRADE DYSPLASIA OR MALIGNANCY 3. Surgical [P], colon, rectum, polyp (1) - INFLAMMATORY POLYP - NEGATIVE FOR DYSPLASIA   1. Surgical [P], gastric - gastritis bx - GASTRIC ANTRAL AND OXYNTIC MUCOSA WITH MILD CHRONIC GASTRITIS - WARTHIN STARRY STAIN IS NEGATIVE FOR HELICOBACTER PYLORI 2. Surgical [P], colon, transverse, polyp (2) - TUBULOVILLOUS ADENOMA(S) - NEGATIVE FOR HIGH-GRADE DYSPLASIA OR MALIGNANCY 3. Surgical [P], colon, rectum, polyp (1) - INFLAMMATORY POLYP - NEGATIVE FOR DYSPLASIA   Past Medical History:  Diagnosis Date   Allergic rhinitis    Anemia, pernicious    Anxiety and depression    sees Dr. Vincente   Arthritis    B12 deficiency 01/23/2020   Catheter-associated urinary tract infection    Diverticulosis of colon    Elevated PSA    and  hypogonadism, sees urologist   GERD (gastroesophageal reflux disease)    hiatal hernia   Hx of blood transfusion reaction 1999   Hyperlipidemia    Hyperparathyroidism    Hyperplastic colon polyp    Hypertension    acei causes cough   IBS (irritable bowel syndrome)    Nephrolithiasis    Obstructive chronic bronchitis without exacerbation, followed by Dr. Brien 03/04/2009   ONO RA  Normal 07/28/11 6 min walk >556m no desat PFTs 08/08/11: DLCO 70% TLC 80%  FeV1 80%   Fef 25 75 56% with significant improvement after BD    Peritonitis (HCC)    after surgery in 1999   Pneumonia    PONV (postoperative nausea and vomiting)    Rectal fissure    Trigeminal neuralgia    has facial asymetry   Ventral hernia     Past Surgical History:  Procedure Laterality Date   ABDOMINAL SURGERY     Multiple   BRAIN SURGERY  01/2011   Trigeminal nerve/Dr Unice   CHOLECYSTECTOMY  1981   DENTAL SURGERY  2019   tooth extraction, root canal   ESOPHAGEAL MANOMETRY N/A 04/28/2020   Procedure: ESOPHAGEAL MANOMETRY (EM);  Surgeon: Shila Gustav GAILS, MD;  Location: [facility];  Service: Endoscopy;  Laterality: N/A;   HAND SURGERY  1973   Left   HERNIA REPAIR  1999   KNEE SURGERY  1997   Left  LITHOTRIPSY     Right   NISSEN FUNDOPLICATION  1999    complicated by gastric perforation, peritonitis, ventral nernia    RHIZOTOMY Right 11/17/2014   Procedure: RHIZOTOMY TO RIGHT V2,V3;  Surgeon: Fairy Levels, MD;  Location: [facility];  Service: Neurosurgery;  Laterality: Right;  right   RHIZOTOMY Right 12/22/2014   Procedure: Right V2 and V3 trigeminal rhysolysis;  Surgeon: Fairy Levels, MD;  Location: [facility];  Service: Neurosurgery;  Laterality: Right;  Right V2 and V3 trigeminal rhysolysis   RHIZOTOMY W/ RADIOFREQUENCY ABLATION  08/2017   Dr Mindi   UPPER GASTROINTESTINAL ENDOSCOPY     VENTRAL HERNIA REPAIR  2004    Current Outpatient Medications  Medication Sig Dispense Refill   acetaminophen   (TYLENOL ) 325 MG tablet Take 2 tablets (650 mg total) by mouth every 6 (six) hours as needed for mild pain, moderate pain or fever.     ALPRAZolam  (XANAX ) 1 MG tablet TAKE 1 TABLET BY MOUTH FOUR TIMES DAILY AS NEEDED FOR ANXIETY 120 tablet 2   amLODipine  (NORVASC ) 2.5 MG tablet Take 1 tablet (2.5 mg total) by mouth daily. 90 tablet 3   carbamazepine  (TEGRETOL  XR) 100 MG 12 hr tablet Take 100-200 mg by mouth See admin instructions. 100mg  in the morning and 200mg  at night.     cyanocobalamin  (VITAMIN B12) 1000 MCG/ML injection INJECT 1 ML every 7 days 60 mL 3   esomeprazole  (NEXIUM ) 40 MG capsule Take 1 capsule (40 mg total) by mouth 2 (two) times daily before a meal. 180 capsule 3   famotidine  (PEPCID ) 20 MG tablet Take 1 tablet (20 mg total) by mouth at bedtime. 90 tablet 3   finasteride  (PROSCAR ) 5 MG tablet Take 1 tablet (5 mg total) by mouth daily. 90 tablet 3   gabapentin  (NEURONTIN ) 400 MG capsule Take 400-800 mg by mouth See admin instructions. Taking 400mg  at 0800, 1400, then 2 capsules (800mg ) at bedtime.     losartan -hydrochlorothiazide  (HYZAAR) 50-12.5 MG tablet Take 1 tablet by mouth daily.     polyethylene glycol powder (GLYCOLAX /MIRALAX ) powder DISSOLVE 1 CAPFUL IN LIQUID EVERY DAY AS NEEDED FOR CONSTIPATION 527 g 2   Probiotic Product (ALIGN) 4 MG CAPS Take 4 mg by mouth daily.     sucralfate  (CARAFATE ) 1 g tablet TAKE 1 TABLET BY MOUTH FOUR TIMES DAILY WITH meals AND AT BEDTIME (DO NOT TAKE WITHIN 4 HOURS OF ANY OTHER MEDICATIONS) Please keep your Oct 7th appointment for further refills 150 tablet 0   tamsulosin  (FLOMAX ) 0.4 MG CAPS capsule Take 0.4 mg by mouth at bedtime. (Patient taking differently: Take 0.4 mg by mouth 2 (two) times daily.)     No current facility-administered medications for this visit.    Allergies as of 08/12/2024 - Review Complete 08/12/2024  Allergen Reaction Noted   Dilantin  [phenytoin ] Other (See Comments) 07/27/2017   Dilaudid  [hydromorphone  hcl] Nausea  And Vomiting 09/12/2017   Morphine  Nausea And Vomiting 05/05/2021   Sulfa antibiotics Hives 12/17/2014   Ace inhibitors Cough 04/02/2008   Augmentin [amoxicillin -pot clavulanate] Nausea And Vomiting 07/06/2017   Codeine Nausea And Vomiting 11/10/2014    Family History  Problem Relation Age of Onset   Colon polyps Mother    Diabetes Mother    Coronary artery disease Mother        CABG   Diabetes Sister    Diabetes Brother    Hypertension Brother    Heart disease Brother    Heart disease Brother  Colon cancer Neg Hx    Esophageal cancer Neg Hx    Stomach cancer Neg Hx    Pancreatic cancer Neg Hx     Social History   Tobacco Use   Smoking status: Former    Current packs/day: 0.00    Average packs/day: 1 pack/day for 20.0 years (20.0 ttl pk-yrs)    Types: Cigarettes    Start date: 11/06/1976    Quit date: 11/06/1996    Years since quitting: 27.7   Smokeless tobacco: Former    Types: Chew    Quit date: 11/07/1983  Vaping Use   Vaping status: Never Used  Substance Use Topics   Alcohol use: No   Drug use: No     Review of Systems:    Constitutional: No weight loss, fever, chills Cardiovascular: No chest pain Respiratory: No SOB Gastrointestinal: See HPI and otherwise negative Hematologic: No bleeding or bruising   Physical Exam:  Vital signs: BP 126/78   Pulse 75   Ht 5' 11 (1.803 m)   Wt 230 lb (104.3 kg)   SpO2 97%   BMI 32.08 kg/m   Wt Readings from Last 3 Encounters:  08/12/24 230 lb (104.3 kg)  08/05/24 231 lb (104.8 kg)  08/01/24 228 lb 2.8 oz (103.5 kg)     Constitutional: Pleasant, well-appearing male in NAD, alert and cooperative Head:  Normocephalic and atraumatic.  Eyes: No scleral icterus.  Respiratory: Respirations even and unlabored. Lungs clear to auscultation bilaterally.  No wheezes, crackles, or rhonchi.  Cardiovascular:  Regular rate and rhythm. No murmurs. No peripheral edema. Gastrointestinal:  Soft, nondistended, nontender.  Midline  surgical scar.  Resolving ecchymosis over lower abdomen which patient states is due to heparin  injections while hospitalized.  No rebound or guarding. Normal bowel sounds. No appreciable masses or hepatomegaly. Rectal:  Not performed.  Neurologic:  Alert and oriented x4;  grossly normal neurologically.  Skin:   Dry and intact without significant lesions or rashes. Psychiatric: Oriented to person, place and time. Demonstrates good judgement and reason without abnormal affect or behaviors.   RELEVANT LABS AND IMAGING: CBC    Component Value Date/Time   WBC 7.0 07/31/2024 1046   RBC 4.96 07/31/2024 1046   HGB 15.4 07/31/2024 1046   HCT 44.6 07/31/2024 1046   PLT 142 (L) 07/31/2024 1046   MCV 89.9 07/31/2024 1046   MCH 31.0 07/31/2024 1046   MCHC 34.5 07/31/2024 1046   RDW 12.8 07/31/2024 1046   LYMPHSABS 2.0 05/23/2024 1053   MONOABS 0.6 05/23/2024 1053   EOSABS 0.1 05/23/2024 1053   BASOSABS 0.0 05/23/2024 1053    CMP     Component Value Date/Time   NA 135 07/31/2024 1046   NA 132 (L) 12/28/2023 1237   K 3.9 07/31/2024 1046   CL 99 07/31/2024 1046   CO2 27 07/31/2024 1046   GLUCOSE 132 (H) 07/31/2024 1046   GLUCOSE 118 (H) 10/25/2006 1414   BUN 15 07/31/2024 1046   BUN 10 12/28/2023 1237   CREATININE 0.96 07/31/2024 1046   CREATININE 0.97 12/31/2012 1108   CALCIUM  8.9 07/31/2024 1046   CALCIUM  9.3 01/23/2012 1045   PROT 6.6 07/31/2024 1046   ALBUMIN 3.5 07/31/2024 1046   AST 63 (H) 07/31/2024 1046   ALT 55 (H) 07/31/2024 1046   ALKPHOS 71 07/31/2024 1046   BILITOT 1.1 07/31/2024 1046   GFRNONAA >60 07/31/2024 1046   GFRAA >60 12/26/2019 0722     Assessment/Plan:   History of adenomatous colon  polyps LLQ abdominal pain History of small bowel obstruction Patient seen today to schedule repeat colonoscopy.  Last colonoscopy done 08/24/2021 with finding of diverticulosis, internal and external hemorrhoids, and 2 tubulovillous adenomas, with recommended recall in 3  years. He was recently hospitalized for partial small bowel obstruction.  States he is doing well since hospitalization.  Did have a CT scan at that time which showed diverticulosis without definitive evidence of diverticulitis but did have mild pericolonic infiltration likely related to mesenteric edema. Today states that he has intermittent LLQ abdominal pain ongoing for 10 years.  Will get a mild achy pain in LLQ which does not radiate and which is not associated with any other symptoms.  Has not sought medical care for this in the past, has not had a CT scan done to evaluate for diverticulitis, and has not required antibiotics in the past.  States it lasts a couple days and then resolves on its own. He started having this same pain today.  We did discuss getting a CT scan to further evaluate.  He does not seem concerned as it is something that has gone on for 10 years.  He is nontender on exam, has no other associated symptoms. I advised that he let us  know in the next day or 2 if his symptoms do not improve, at which time we can order a CT scan for further evaluation.  Also gave ER precautions for worsening symptoms or onset of fever, chills, nausea, vomiting, rectal bleeding, etc.  Will schedule out colonoscopy at least 4 to 6 weeks in the event he does have diverticulitis to allow healing.  - Schedule colonoscopy. I thoroughly discussed the procedure with the patient to include nature of the procedure, alternatives, benefits, and risks (including but not limited to bleeding, infection, perforation, anesthesia/cardiac/pulmonary complications). Patient verbalized understanding and gave verbal consent to proceed with procedure.  - Follow up in a couple days with symptoms update, CT scan if symptoms persist. - ER precautions given - Labs today: CBC, CMP  GERD Hiatal hernia History of Nissen fundoplication Patient with history of GERD, hiatal hernia s/p fundoplication 1999 complicated by  perforation and redo surgery.  States his GERD symptoms are controlled on medication, though he does intermittently get a pain in his lower sternum/upper abdomen which he attributes to his hiatal hernia and loosened fundoplication.  Would like to have upper endoscopy again with colonoscopy which I think is appropriate given his ongoing symptoms.  On last EGD in 2022 he did have reflux esophagitis, medium size hiatal hernia, and gastritis.  - Schedule EGD to be done with upcoming colonoscopy. - Continue Nexium  40 mg twice daily, Pepcid  20 mg at bedtime, Carafate  as needed   Camie Furbish, PA-C [facility] 08/12/2024, 10:12 AM  Patient Care Team: Norleen Lynwood ORN, MD as PCP - General (Internal Medicine) Delford Maude BROCKS, MD as PCP - Cardiology (Cardiology) Ottelin, Mark, MD (Inactive) (Urology) Obie Princella HERO, MD (Inactive) (Gastroenterology) Vincente Grip, MD (Psychiatry) Roddie Bring, DPM (Podiatry) Livingston Rigg, MD (Dermatology) Gladis Cough, MD (General Surgery) Cleatus Collar, MD as Consulting Physician (Ophthalmology) [facility] (Ophthalmology) Shila Gustav GAILS, MD as Consulting Physician (Gastroenterology)

## 2024-08-18 ENCOUNTER — Other Ambulatory Visit: Payer: Self-pay

## 2024-08-18 ENCOUNTER — Telehealth: Payer: Self-pay | Admitting: Gastroenterology

## 2024-08-18 DIAGNOSIS — R1032 Left lower quadrant pain: Secondary | ICD-10-CM

## 2024-08-18 DIAGNOSIS — K449 Diaphragmatic hernia without obstruction or gangrene: Secondary | ICD-10-CM

## 2024-08-18 DIAGNOSIS — K219 Gastro-esophageal reflux disease without esophagitis: Secondary | ICD-10-CM

## 2024-08-18 DIAGNOSIS — Z8719 Personal history of other diseases of the digestive system: Secondary | ICD-10-CM

## 2024-08-18 DIAGNOSIS — Z9889 Other specified postprocedural states: Secondary | ICD-10-CM

## 2024-08-18 DIAGNOSIS — Z860101 Personal history of adenomatous and serrated colon polyps: Secondary | ICD-10-CM

## 2024-08-18 MED ORDER — NA SULFATE-K SULFATE-MG SULF 17.5-3.13-1.6 GM/177ML PO SOLN
1.0000 | Freq: Once | ORAL | 0 refills | Status: AC
Start: 2024-08-18 — End: 2024-08-18

## 2024-08-18 NOTE — Telephone Encounter (Signed)
 I spoke to Mr. [name] wife and I advised her that we still don't have any liquid prep samples (pt can't have pill samples because of his history).  She advised that the prep was never sent and I confirmed that I never sent it because they said that they wanted to wait to see if they could get a sample.  I told her that I can sendit in and she stated that when she did hers it was over $100 for the prep.  I asked if she still had the 2 discount cards that I gave her the day of the appointment and she said yes.  I explained again that if there is a high out of pocket, she should use one of those cards for a lower price at one of the major pharmacies.  I told her that we can send in another script if needed.

## 2024-08-18 NOTE — Telephone Encounter (Signed)
 Inbound call from patient requesting a call to discuss picking up sample of prep medication. Please advise, thank you

## 2024-08-20 ENCOUNTER — Telehealth: Payer: Self-pay

## 2024-08-20 NOTE — Telephone Encounter (Signed)
 I spoke to [name] and he stated that his LLQ pain is a little better.  It still comes and goes but not as bad.

## 2024-09-16 ENCOUNTER — Encounter: Payer: Self-pay | Admitting: Gastroenterology

## 2024-09-19 ENCOUNTER — Other Ambulatory Visit: Payer: Self-pay | Admitting: Nurse Practitioner

## 2024-09-21 NOTE — Telephone Encounter (Signed)
 Camie, this is a patient of Dr. Trenna, you saw him recently therefore I am forwarding his request for Carafate  refill to you.  THX.

## 2024-09-23 ENCOUNTER — Ambulatory Visit: Admitting: Gastroenterology

## 2024-09-23 ENCOUNTER — Encounter: Payer: Self-pay | Admitting: Gastroenterology

## 2024-09-23 VITALS — BP 155/95 | HR 72 | Temp 97.3°F | Resp 22 | Ht 71.0 in | Wt 230.0 lb

## 2024-09-23 DIAGNOSIS — K5730 Diverticulosis of large intestine without perforation or abscess without bleeding: Secondary | ICD-10-CM | POA: Diagnosis not present

## 2024-09-23 DIAGNOSIS — K2970 Gastritis, unspecified, without bleeding: Secondary | ICD-10-CM

## 2024-09-23 DIAGNOSIS — K648 Other hemorrhoids: Secondary | ICD-10-CM

## 2024-09-23 DIAGNOSIS — K644 Residual hemorrhoidal skin tags: Secondary | ICD-10-CM

## 2024-09-23 DIAGNOSIS — K449 Diaphragmatic hernia without obstruction or gangrene: Secondary | ICD-10-CM | POA: Diagnosis not present

## 2024-09-23 DIAGNOSIS — D128 Benign neoplasm of rectum: Secondary | ICD-10-CM

## 2024-09-23 DIAGNOSIS — Z1211 Encounter for screening for malignant neoplasm of colon: Secondary | ICD-10-CM

## 2024-09-23 DIAGNOSIS — D123 Benign neoplasm of transverse colon: Secondary | ICD-10-CM | POA: Diagnosis not present

## 2024-09-23 DIAGNOSIS — K219 Gastro-esophageal reflux disease without esophagitis: Secondary | ICD-10-CM

## 2024-09-23 DIAGNOSIS — Z860101 Personal history of adenomatous and serrated colon polyps: Secondary | ICD-10-CM

## 2024-09-23 DIAGNOSIS — Z860100 Personal history of colon polyps, unspecified: Secondary | ICD-10-CM

## 2024-09-23 MED ORDER — SODIUM CHLORIDE 0.9 % IV SOLN: 500.0000 mL | Freq: Once | INTRAVENOUS | Status: DC

## 2024-09-23 NOTE — Progress Notes (Unsigned)
 Vss nad trans to pacu

## 2024-09-23 NOTE — Progress Notes (Unsigned)
  Gastroenterology History and Physical   Primary Care Physician:  Norleen Lynwood ORN, MD   Reason for Procedure:  Recurrent gerd s/p Nissen, h/o adenomatous colon polyps  Plan:    EGD and colonoscopy with possible interventions as needed     HPI: [name] is a very pleasant 73 y.o. male here for EGD and colonoscopy for recurrent gerd s/p Nissen, h/o adenomatous colon polyps.   The risks and benefits as well as alternatives of endoscopic procedure(s) have been discussed and reviewed.  The patient was provided an opportunity to ask questions and all were answered. The patient agreed with the plan and demonstrated an understanding of the instructions.   Past Medical History:  Diagnosis Date   Allergic rhinitis    Anemia, pernicious    Anxiety and depression    sees Dr. Vincente   Arthritis    B12 deficiency 01/23/2020   Blood transfusion without reported diagnosis    Catheter-associated urinary tract infection    Diverticulosis of colon    Elevated PSA    and hypogonadism, sees urologist   GERD (gastroesophageal reflux disease)    hiatal hernia   Hx of blood transfusion reaction 1999   Hyperlipidemia    Hyperparathyroidism    Hyperplastic colon polyp    Hypertension    acei causes cough   IBS (irritable bowel syndrome)    Nephrolithiasis    Obstructive chronic bronchitis without exacerbation, followed by Dr. Brien 03/04/2009   ONO RA  Normal 07/28/11 6 min walk >548m no desat PFTs 08/08/11: DLCO 70% TLC 80%  FeV1 80%   Fef 25 75 56% with significant improvement after BD    Peritonitis (HCC)    after surgery in 1999   Pneumonia    PONV (postoperative nausea and vomiting)    Rectal fissure    Trigeminal neuralgia    has facial asymetry   Ventral hernia     Past Surgical History:  Procedure Laterality Date   ABDOMINAL SURGERY     Multiple   BRAIN SURGERY  01/2011   Trigeminal nerve/Dr Unice   CHOLECYSTECTOMY  1981   DENTAL SURGERY  2019   tooth  extraction, root canal   ESOPHAGEAL MANOMETRY N/A 04/28/2020   Procedure: ESOPHAGEAL MANOMETRY (EM);  Surgeon: Shila Gustav GAILS, MD;  Location: [facility];  Service: Endoscopy;  Laterality: N/A;   HAND SURGERY  1973   Left   HERNIA REPAIR  1999   KNEE SURGERY  1997   Left   LITHOTRIPSY     Right   NISSEN FUNDOPLICATION  1999    complicated by gastric perforation, peritonitis, ventral nernia    RHIZOTOMY Right 11/17/2014   Procedure: RHIZOTOMY TO RIGHT V2,V3;  Surgeon: Fairy Unice, MD;  Location: [facility];  Service: Neurosurgery;  Laterality: Right;  right   RHIZOTOMY Right 12/22/2014   Procedure: Right V2 and V3 trigeminal rhysolysis;  Surgeon: Fairy Unice, MD;  Location: [facility];  Service: Neurosurgery;  Laterality: Right;  Right V2 and V3 trigeminal rhysolysis   RHIZOTOMY W/ RADIOFREQUENCY ABLATION  08/2017   Dr Mindi   UPPER GASTROINTESTINAL ENDOSCOPY     VENTRAL HERNIA REPAIR  2004    Prior to Admission medications   Medication Sig Start Date End Date Taking? Authorizing Provider  ALPRAZolam  (XANAX ) 1 MG tablet TAKE 1 TABLET BY MOUTH FOUR TIMES DAILY AS NEEDED FOR ANXIETY 05/23/24  Yes Norleen Lynwood ORN, MD  amLODipine  (NORVASC ) 2.5 MG tablet Take 1 tablet (2.5 mg  total) by mouth daily. 06/19/24  Yes Norleen Lynwood ORN, MD  carbamazepine  (TEGRETOL  XR) 100 MG 12 hr tablet Take 100-200 mg by mouth See admin instructions. 100mg  in the morning and 200mg  at night.   Yes [provider]  cyanocobalamin  (VITAMIN B12) 1000 MCG/ML injection INJECT 1 ML every 7 days 05/23/24  Yes Norleen Lynwood ORN, MD  esomeprazole  (NEXIUM ) 40 MG capsule Take 1 capsule (40 mg total) by mouth 2 (two) times daily before a meal. 01/10/24  Yes Cara Elida HERO, NP  famotidine  (PEPCID ) 20 MG tablet Take 1 tablet (20 mg total) by mouth at bedtime. 05/23/24  Yes Norleen Lynwood ORN, MD  finasteride  (PROSCAR ) 5 MG tablet Take 1 tablet (5 mg total) by mouth daily. 11/13/22  Yes Norleen Lynwood ORN, MD  gabapentin   (NEURONTIN ) 400 MG capsule Take 400-800 mg by mouth See admin instructions. Taking 400mg  at 0800, 1400, then 2 capsules (800mg ) at bedtime. 06/16/20  Yes [provider]  losartan -hydrochlorothiazide  (HYZAAR) 50-12.5 MG tablet Take 1 tablet by mouth daily. 07/23/24  Yes [provider]  polyethylene glycol powder (GLYCOLAX /MIRALAX ) powder DISSOLVE 1 CAPFUL IN LIQUID EVERY DAY AS NEEDED FOR CONSTIPATION 02/18/14  Yes Luke Chiquita SAUNDERS, DO  Probiotic Product (ALIGN) 4 MG CAPS Take 4 mg by mouth daily.   Yes [provider]  sucralfate  (CARAFATE ) 1 g tablet TAKE 1 TABLET BY MOUTH FOUR TIMES DAILY WITH meals AND AT BEDTIME (DO NOT TAKE WITHIN 4 HOURS OF ANY OTHER MEDICATIONS). 09/19/24  Yes Arletta Camie BRAVO, PA-C  tamsulosin  (FLOMAX ) 0.4 MG CAPS capsule Take 0.4 mg by mouth at bedtime. Patient taking differently: Take 0.4 mg by mouth 2 (two) times daily. 06/21/20  Yes [provider]  acetaminophen  (TYLENOL ) 325 MG tablet Take 2 tablets (650 mg total) by mouth every 6 (six) hours as needed for mild pain, moderate pain or fever. 12/26/19   Vicci Burnard SAUNDERS, PA-C    Current Outpatient Medications  Medication Sig Dispense Refill   ALPRAZolam  (XANAX ) 1 MG tablet TAKE 1 TABLET BY MOUTH FOUR TIMES DAILY AS NEEDED FOR ANXIETY 120 tablet 2   amLODipine  (NORVASC ) 2.5 MG tablet Take 1 tablet (2.5 mg total) by mouth daily. 90 tablet 3   carbamazepine  (TEGRETOL  XR) 100 MG 12 hr tablet Take 100-200 mg by mouth See admin instructions. 100mg  in the morning and 200mg  at night.     cyanocobalamin  (VITAMIN B12) 1000 MCG/ML injection INJECT 1 ML every 7 days 60 mL 3   esomeprazole  (NEXIUM ) 40 MG capsule Take 1 capsule (40 mg total) by mouth 2 (two) times daily before a meal. 180 capsule 3   famotidine  (PEPCID ) 20 MG tablet Take 1 tablet (20 mg total) by mouth at bedtime. 90 tablet 3   finasteride  (PROSCAR ) 5 MG tablet Take 1 tablet (5 mg total) by mouth daily. 90 tablet 3   gabapentin  (NEURONTIN )  400 MG capsule Take 400-800 mg by mouth See admin instructions. Taking 400mg  at 0800, 1400, then 2 capsules (800mg ) at bedtime.     losartan -hydrochlorothiazide  (HYZAAR) 50-12.5 MG tablet Take 1 tablet by mouth daily.     polyethylene glycol powder (GLYCOLAX /MIRALAX ) powder DISSOLVE 1 CAPFUL IN LIQUID EVERY DAY AS NEEDED FOR CONSTIPATION 527 g 2   Probiotic Product (ALIGN) 4 MG CAPS Take 4 mg by mouth daily.     sucralfate  (CARAFATE ) 1 g tablet TAKE 1 TABLET BY MOUTH FOUR TIMES DAILY WITH meals AND AT BEDTIME (DO NOT TAKE WITHIN 4 HOURS OF ANY  OTHER MEDICATIONS). 150 tablet 0   tamsulosin  (FLOMAX ) 0.4 MG CAPS capsule Take 0.4 mg by mouth at bedtime. (Patient taking differently: Take 0.4 mg by mouth 2 (two) times daily.)     acetaminophen  (TYLENOL ) 325 MG tablet Take 2 tablets (650 mg total) by mouth every 6 (six) hours as needed for mild pain, moderate pain or fever.     Current Facility-Administered Medications  Medication Dose Route Frequency Provider Last Rate Last Admin   0.9 %  sodium chloride  infusion  500 mL Intravenous Once Tenlee Wollin V, MD        Allergies as of 09/23/2024 - Review Complete 09/23/2024  Allergen Reaction Noted   Dilantin  [phenytoin ] Other (See Comments) 07/27/2017   Dilaudid  [hydromorphone  hcl] Nausea And Vomiting 09/12/2017   Morphine  Nausea And Vomiting 05/05/2021   Sulfa antibiotics Hives 12/17/2014   Ace inhibitors Cough 04/02/2008   Augmentin [amoxicillin -pot clavulanate] Nausea And Vomiting 07/06/2017   Codeine Nausea And Vomiting 11/10/2014    Family History  Problem Relation Age of Onset   Colon polyps Mother    Diabetes Mother    Coronary artery disease Mother        CABG   Diabetes Sister    Diabetes Brother    Hypertension Brother    Heart disease Brother    Heart disease Brother    Colon cancer Neg Hx    Esophageal cancer Neg Hx    Stomach cancer Neg Hx    Pancreatic cancer Neg Hx    Rectal cancer Neg Hx     Social History    Socioeconomic History   Marital status: Married    Spouse name: Ricka   Number of children: 2   Years of education: Not on file   Highest education level: Not on file  Occupational History   Occupation: Disabled - psych  Tobacco Use   Smoking status: Former    Current packs/day: 0.00    Average packs/day: 1 pack/day for 20.0 years (20.0 ttl pk-yrs)    Types: Cigarettes    Start date: 11/06/1976    Quit date: 11/06/1996    Years since quitting: 27.8   Smokeless tobacco: Former    Types: Chew    Quit date: 11/07/1983  Vaping Use   Vaping status: Never Used  Substance and Sexual Activity   Alcohol use: No   Drug use: No   Sexual activity: Yes    Partners: Female  Other Topics Concern   Not on file  Social History Narrative   Daily Caffeine Use:  Yes      Lives with wife/2025   Social Drivers of Health   Financial Resource Strain: Low Risk  (07/08/2024)   Overall Financial Resource Strain (CARDIA)    Difficulty of Paying Living Expenses: Not hard at all  Food Insecurity: No Food Insecurity (07/28/2024)   Hunger Vital Sign    Worried About Running Out of Food in the Last Year: Never true    Ran Out of Food in the Last Year: Never true  Transportation Needs: No Transportation Needs (07/28/2024)   PRAPARE - Administrator, Civil Service (Medical): No    Lack of Transportation (Non-Medical): No  Physical Activity: Insufficiently Active (07/08/2024)   Exercise Vital Sign    Days of Exercise per Week: 7 days    Minutes of Exercise per Session: 20 min  Stress: No Stress Concern Present (07/08/2024)   [employer] of Occupational Health - Occupational Stress Questionnaire  Feeling of Stress: Not at all  Social Connections: Moderately Isolated (07/28/2024)   Social Connection and Isolation Panel    Frequency of Communication with Friends and Family: More than three times a week    Frequency of Social Gatherings with Friends and Family: Three times a week    Attends  Religious Services: Never    Active Member of Clubs or Organizations: No    Attends Banker Meetings: Never    Marital Status: Married  Catering Manager Violence: Not At Risk (07/28/2024)   Humiliation, Afraid, Rape, and Kick questionnaire    Fear of Current or Ex-Partner: No    Emotionally Abused: No    Physically Abused: No    Sexually Abused: No    Review of Systems:  All other review of systems negative except as mentioned in the HPI.  Physical Exam: Vital signs in last 24 hours: BP (!) 162/108   Pulse 74   Temp (!) 97.3 F (36.3 C) (Temporal)   Ht 5' 11 (1.803 m)   Wt 230 lb (104.3 kg)   SpO2 99%   BMI 32.08 kg/m  General:   Alert, NAD Lungs:  Clear .   Heart:  Regular rate and rhythm Abdomen:  Soft, nontender and nondistended. Neuro/Psych:  Alert and cooperative. Normal mood and affect. A and O x 3  Reviewed labs, radiology imaging, old records and pertinent past GI work up  Patient is appropriate for planned procedure(s) and anesthesia in an ambulatory setting   K. Veena Laquinn Shippy , MD [phone]

## 2024-09-23 NOTE — Patient Instructions (Signed)
 Discharge instructions given. Handouts on polyps,Diverticulosis,Hemorrhoids and Gastritis. Resume previous medications. YOU HAD AN ENDOSCOPIC PROCEDURE TODAY AT THE [facility]:   Refer to the procedure report that was given to you for any specific questions about what was found during the examination.  If the procedure report does not answer your questions, please call your gastroenterologist to clarify.  If you requested that your care partner not be given the details of your procedure findings, then the procedure report has been included in a sealed envelope for you to review at your convenience later.  YOU SHOULD EXPECT: Some feelings of bloating in the abdomen. Passage of more gas than usual.  Walking can help get rid of the air that was put into your GI tract during the procedure and reduce the bloating. If you had a lower endoscopy (such as a colonoscopy or flexible sigmoidoscopy) you may notice spotting of blood in your stool or on the toilet paper. If you underwent a bowel prep for your procedure, you may not have a normal bowel movement for a few days.  Please Note:  You might notice some irritation and congestion in your nose or some drainage.  This is from the oxygen used during your procedure.  There is no need for concern and it should clear up in a day or so.  SYMPTOMS TO REPORT IMMEDIATELY:  Following lower endoscopy (colonoscopy or flexible sigmoidoscopy):  Excessive amounts of blood in the stool  Significant tenderness or worsening of abdominal pains  Swelling of the abdomen that is new, acute  Fever of 100F or higher  Following upper endoscopy (EGD)  Vomiting of blood or coffee ground material  New chest pain or pain under the shoulder blades  Painful or persistently difficult swallowing  New shortness of breath  Fever of 100F or higher  Black, tarry-looking stools  For urgent or emergent issues, a gastroenterologist can be reached at any hour by calling  [phone]. Do not use MyChart messaging for urgent concerns.    DIET:  We do recommend a small meal at first, but then you may proceed to your regular diet.  Drink plenty of fluids but you should avoid alcoholic beverages for 24 hours.  ACTIVITY:  You should plan to take it easy for the rest of today and you should NOT DRIVE or use heavy machinery until tomorrow (because of the sedation medicines used during the test).    FOLLOW UP: Our staff will call the number listed on your records the next business day following your procedure.  We will call around 7:15- 8:00 am to check on you and address any questions or concerns that you may have regarding the information given to you following your procedure. If we do not reach you, we will leave a message.     If any biopsies were taken you will be contacted by phone or by letter within the next 1-3 weeks.  Please call us  at [phone] if you have not heard about the biopsies in 3 weeks.    SIGNATURES/CONFIDENTIALITY: You and/or your care partner have signed paperwork which will be entered into your electronic medical record.  These signatures attest to the fact that that the information above on your After Visit Summary has been reviewed and is understood.  Full responsibility of the confidentiality of this discharge information lies with you and/or your care-partner.

## 2024-09-23 NOTE — Op Note (Signed)
 [facility] Patient Name: [name] Procedure Date: 09/23/2024 10:56 AM MRN: [id number] Endoscopist: Gustav ALONSO Mcgee , MD, [id number] Age: 73 Referring MD:  Date of Birth: 02/18/1951 Gender: Male Account #: [id number] Procedure:                Colonoscopy Indications:              High risk colon cancer surveillance: Personal                            history of adenoma (10 mm or greater in size), High                            risk colon cancer surveillance: Personal history of                            multiple (3 or more) adenomas Medicines:                Monitored Anesthesia Care Procedure:                Pre-Anesthesia Assessment:                           - Prior to the procedure, a History and Physical                            was performed, and patient medications and                            allergies were reviewed. The patient's tolerance of                            previous anesthesia was also reviewed. The risks                            and benefits of the procedure and the sedation                            options and risks were discussed with the patient.                            All questions were answered, and informed consent                            was obtained. Prior Anticoagulants: The patient has                            taken no anticoagulant or antiplatelet agents. ASA                            Grade Assessment: III - A patient with severe                            systemic disease. After reviewing the risks and  benefits, the patient was deemed in satisfactory                            condition to undergo the procedure.                           After obtaining informed consent, the colonoscope                            was passed under direct vision. Throughout the                            procedure, the patient's blood pressure, pulse, and                            oxygen  saturations were monitored continuously. The                            Olympus Scope SN [id number] was introduced through the                            anus and advanced to the the cecum, identified by                            appendiceal orifice and ileocecal valve. The                            colonoscopy was performed without difficulty. The                            patient tolerated the procedure well. The quality                            of the bowel preparation was adequate. The                            ileocecal valve, appendiceal orifice, and rectum                            were photographed. Scope In: 11:07:25 AM Scope Out: 11:22:40 AM Scope Withdrawal Time: 0 hours 10 minutes 36 seconds  Total Procedure Duration: 0 hours 15 minutes 15 seconds  Findings:                 The perianal and digital rectal examinations were                            normal.                           An 11 mm polyp was found in the transverse colon.                            The polyp was sessile. The polyp was removed with a  cold snare. Resection and retrieval were complete.                           A 5 mm polyp was found in the rectum. The polyp was                            semi-pedunculated. The polyp was removed with a hot                            snare. Resection and retrieval were complete.                           Scattered large-mouthed, medium-mouthed and                            small-mouthed diverticula were found in the sigmoid                            colon, descending colon, transverse colon and                            ascending colon.                           Non-bleeding external and internal hemorrhoids were                            found during retroflexion. The hemorrhoids were                            medium-sized. Complications:            No immediate complications. Estimated Blood Loss:     Estimated blood loss was  minimal. Impression:               - One 11 mm polyp in the transverse colon, removed                            with a cold snare. Resected and retrieved.                           - One 5 mm polyp in the rectum, removed with a hot                            snare. Resected and retrieved.                           - Moderate diverticulosis in the sigmoid colon, in                            the descending colon, in the transverse colon and                            in the ascending colon.                           -  Non-bleeding external and internal hemorrhoids. Recommendation:           - Patient has a contact number available for                            emergencies. The signs and symptoms of potential                            delayed complications were discussed with the                            patient. Return to normal activities tomorrow.                            Written discharge instructions were provided to the                            patient.                           - Resume previous diet.                           - Continue present medications.                           - Await pathology results.                           - Repeat colonoscopy in 3 years for surveillance                            based on pathology results. Isidor Bromell V. Rainey Rodger, MD 09/23/2024 11:34:40 AM This report has been signed electronically.

## 2024-09-23 NOTE — Op Note (Signed)
 [facility] Patient Name: [name] Procedure Date: 09/23/2024 10:58 AM MRN: [id number] Endoscopist: Gustav ALONSO Mcgee , MD, [id number] Age: 73 Referring MD:  Date of Birth: 05-09-1951 Gender: Male Account #: [id number] Procedure:                Upper GI endoscopy Indications:              Epigastric abdominal pain, Esophageal reflux                            symptoms that persist despite appropriate therapy,                            Esophageal reflux symptoms that recur despite                            appropriate therapy Medicines:                Monitored Anesthesia Care Procedure:                Pre-Anesthesia Assessment:                           - Prior to the procedure, a History and Physical                            was performed, and patient medications and                            allergies were reviewed. The patient's tolerance of                            previous anesthesia was also reviewed. The risks                            and benefits of the procedure and the sedation                            options and risks were discussed with the patient.                            All questions were answered, and informed consent                            was obtained. Prior Anticoagulants: The patient has                            taken no anticoagulant or antiplatelet agents. ASA                            Grade Assessment: III - A patient with severe                            systemic disease. After reviewing the risks and  benefits, the patient was deemed in satisfactory                            condition to undergo the procedure.                           After obtaining informed consent, the endoscope was                            passed under direct vision. Throughout the                            procedure, the patient's blood pressure, pulse, and                            oxygen saturations were  monitored continuously. The                            GIF [id number] was introduced through the                            mouth, and advanced to the second part of duodenum.                            The upper GI endoscopy was accomplished without                            difficulty. The patient tolerated the procedure                            well. Scope In: Scope Out: Findings:                 The Z-line was regular and was found 40 cm from the                            incisors.                           Evidence of a slipped Nissen fundoplication was                            found in the cardia. The wrap appeared intact. This                            was traversed. A 3 cm hiatal hernia was present.                           Patchy mild inflammation characterized by                            congestion (edema), erythema, friability and                            nodularity was found in the entire examined  stomach. Biopsies were taken with a cold forceps                            for histology.                           The cardia and gastric fundus were normal on                            retroflexion.                           The examined duodenum was normal. Complications:            No immediate complications. Estimated Blood Loss:     Estimated blood loss was minimal. Impression:               - Z-line regular, 40 cm from the incisors.                           - A Nissen fundoplication was found. The wrap                            appears intact.                           - Gastritis. Biopsied.                           - Normal examined duodenum. Recommendation:           - Resume previous diet.                           - Continue present medications.                           - Continue Nexium  twice daily                           - Await pathology results.                           - Follow an antireflux regimen. Jeffrey Mcdougald V.  Caydon Feasel, MD 09/23/2024 11:31:51 AM This report has been signed electronically.

## 2024-09-24 ENCOUNTER — Telehealth: Payer: Self-pay

## 2024-09-24 NOTE — Telephone Encounter (Signed)
 No answer, left message to call if having any issues or concerns, B.Vester Titsworth RN

## 2024-09-25 LAB — SURGICAL PATHOLOGY

## 2024-10-09 ENCOUNTER — Other Ambulatory Visit: Payer: Self-pay | Admitting: Internal Medicine

## 2024-10-11 ENCOUNTER — Other Ambulatory Visit: Payer: Self-pay | Admitting: Nurse Practitioner

## 2024-10-27 ENCOUNTER — Ambulatory Visit: Payer: Self-pay | Admitting: Gastroenterology

## 2024-11-12 ENCOUNTER — Encounter: Payer: Self-pay | Admitting: Pharmacist

## 2024-11-26 ENCOUNTER — Encounter: Payer: Self-pay | Admitting: Pharmacist

## 2024-11-26 NOTE — Progress Notes (Signed)
 Pharmacy Quality Measure Review  This patient is appearing on the insurance-providing list for being at risk of failing the adherence measure for Statin Therapy for Patients with Cardiovascular Disease [facility]) medications this calendar year.  Per review of chart and payor information, patient has a diagnosis of cardiovascular disease but is not currently filling a statin prescription.   Pt has documented hx of statin myalgias. Left G72.0 code on upcoming PCP appt for 11/28/24 for drug-induced myopathy for PCP to sign off on.  Darrelyn Drum, PharmD, BCPS, CPP Clinical Pharmacist Practitioner [facility] [phone]

## 2024-11-28 ENCOUNTER — Encounter: Payer: Self-pay | Admitting: Internal Medicine

## 2024-11-28 ENCOUNTER — Ambulatory Visit: Payer: Self-pay | Admitting: Internal Medicine

## 2024-11-28 ENCOUNTER — Ambulatory Visit: Admitting: Internal Medicine

## 2024-11-28 VITALS — BP 120/78 | HR 67 | Temp 97.9°F | Ht 71.0 in | Wt 237.0 lb

## 2024-11-28 DIAGNOSIS — Z0001 Encounter for general adult medical examination with abnormal findings: Secondary | ICD-10-CM

## 2024-11-28 DIAGNOSIS — R972 Elevated prostate specific antigen [PSA]: Secondary | ICD-10-CM | POA: Diagnosis not present

## 2024-11-28 DIAGNOSIS — I10 Essential (primary) hypertension: Secondary | ICD-10-CM

## 2024-11-28 DIAGNOSIS — Z0000 Encounter for general adult medical examination without abnormal findings: Secondary | ICD-10-CM | POA: Diagnosis not present

## 2024-11-28 DIAGNOSIS — E559 Vitamin D deficiency, unspecified: Secondary | ICD-10-CM

## 2024-11-28 DIAGNOSIS — G720 Drug-induced myopathy: Secondary | ICD-10-CM | POA: Diagnosis not present

## 2024-11-28 DIAGNOSIS — M65341 Trigger finger, right ring finger: Secondary | ICD-10-CM

## 2024-11-28 DIAGNOSIS — E7800 Pure hypercholesterolemia, unspecified: Secondary | ICD-10-CM | POA: Diagnosis not present

## 2024-11-28 DIAGNOSIS — R739 Hyperglycemia, unspecified: Secondary | ICD-10-CM

## 2024-11-28 LAB — CBC WITH DIFFERENTIAL/PLATELET
Basophils Absolute: 0.1 K/uL (ref 0.0–0.1)
Basophils Relative: 0.8 % (ref 0.0–3.0)
Eosinophils Absolute: 0.1 K/uL (ref 0.0–0.7)
Eosinophils Relative: 0.6 % (ref 0.0–5.0)
HCT: 44.8 % (ref 39.0–52.0)
Hemoglobin: 15.7 g/dL (ref 13.0–17.0)
Lymphocytes Relative: 29.6 % (ref 12.0–46.0)
Lymphs Abs: 2.6 K/uL (ref 0.7–4.0)
MCHC: 35.2 g/dL (ref 30.0–36.0)
MCV: 89.2 fl (ref 78.0–100.0)
Monocytes Absolute: 0.6 K/uL (ref 0.1–1.0)
Monocytes Relative: 6.7 % (ref 3.0–12.0)
Neutro Abs: 5.5 K/uL (ref 1.4–7.7)
Neutrophils Relative %: 62.3 % (ref 43.0–77.0)
Platelets: 148 K/uL — ABNORMAL LOW (ref 150.0–400.0)
RBC: 5.02 Mil/uL (ref 4.22–5.81)
RDW: 13.9 % (ref 11.5–15.5)
WBC: 8.8 K/uL (ref 4.0–10.5)

## 2024-11-28 LAB — URINALYSIS, ROUTINE W REFLEX MICROSCOPIC
Bilirubin Urine: NEGATIVE
Hgb urine dipstick: NEGATIVE
Ketones, ur: NEGATIVE
Nitrite: NEGATIVE
RBC / HPF: NONE SEEN
Specific Gravity, Urine: 1.01 (ref 1.000–1.030)
Total Protein, Urine: NEGATIVE
Urine Glucose: NEGATIVE
Urobilinogen, UA: 0.2 (ref 0.0–1.0)
pH: 7 (ref 5.0–8.0)

## 2024-11-28 LAB — LIPID PANEL
Cholesterol: 152 mg/dL (ref 28–200)
HDL: 60.7 mg/dL
LDL Cholesterol: 68 mg/dL (ref 10–99)
NonHDL: 91.74
Total CHOL/HDL Ratio: 3
Triglycerides: 118 mg/dL (ref 10.0–149.0)
VLDL: 23.6 mg/dL (ref 0.0–40.0)

## 2024-11-28 LAB — PSA: PSA: 3.18 ng/mL (ref 0.10–4.00)

## 2024-11-28 LAB — TSH: TSH: 2.09 m[IU]/L (ref 0.40–4.50)

## 2024-11-28 LAB — BASIC METABOLIC PANEL WITH GFR
BUN: 11 mg/dL (ref 6–23)
CO2: 31 meq/L (ref 19–32)
Calcium: 9.4 mg/dL (ref 8.4–10.5)
Chloride: 96 meq/L (ref 96–112)
Creatinine, Ser: 0.85 mg/dL (ref 0.40–1.50)
GFR: 86.85 mL/min
Glucose, Bld: 94 mg/dL (ref 70–99)
Potassium: 4.2 meq/L (ref 3.5–5.1)
Sodium: 136 meq/L (ref 135–145)

## 2024-11-28 LAB — HEMOGLOBIN A1C: Hgb A1c MFr Bld: 5.7 % (ref 4.6–6.5)

## 2024-11-28 LAB — HEPATIC FUNCTION PANEL
ALT: 10 U/L (ref 3–53)
AST: 14 U/L (ref 5–37)
Albumin: 4.3 g/dL (ref 3.5–5.2)
Alkaline Phosphatase: 73 U/L (ref 39–117)
Bilirubin, Direct: 0.1 mg/dL (ref 0.1–0.3)
Total Bilirubin: 0.5 mg/dL (ref 0.2–1.2)
Total Protein: 7 g/dL (ref 6.0–8.3)

## 2024-11-28 NOTE — Assessment & Plan Note (Signed)
.   Lab Results  Component Value Date   HGBA1C 5.8 05/23/2024   Stable, pt to continue current medical treatment - diet, wt control

## 2024-11-28 NOTE — Assessment & Plan Note (Signed)
 With mild worsening  - for hand surgury referral

## 2024-11-28 NOTE — Assessment & Plan Note (Signed)
 Lab Results  Component Value Date   LDLCALC 84 05/23/2024   Mild uncontrolled, pt to continue low chol diet, declines statin for now

## 2024-11-28 NOTE — Progress Notes (Signed)
 The test results show that your current treatment is OK, as the tests are stable.  Please continue the same plan.  There is no other need for change of treatment or further evaluation based on these results, at this time.  thanks

## 2024-11-28 NOTE — Progress Notes (Signed)
 Patient ID: [name], male   DOB: 1951-09-17, 74 y.o.   MRN: [id number]         Chief Complaint:: wellness exam and right 4th finger trigger finger       HPI:  [name] is a 74 y.o. male here for wellness exam, declines flu shot, o/w up to date                        Also does have mild to mod worsening right 4th trigger like finger Pt denies chest pain, increased sob or doe, wheezing, orthopnea, PND, increased LE swelling, palpitations, dizziness or syncope.   Pt denies polydipsia, polyuria, or new focal neuro s/s.    Pt denies fever, wt loss, night sweats, loss of appetite, or other constitutional symptoms     Wt Readings from Last 3 Encounters:  11/28/24 237 lb (107.5 kg)  09/23/24 230 lb (104.3 kg)  08/12/24 230 lb (104.3 kg)   BP Readings from Last 3 Encounters:  11/28/24 120/78  09/23/24 (!) 155/95  08/12/24 126/78   Immunization History  Administered Date(s) Administered   Fluad Quad(high Dose 65+) 10/23/2022   Influenza Split 09/12/2021   Influenza Whole 09/23/2007, 08/06/2010, 08/22/2011   Influenza,inj,Quad PF,6+ Mos 09/21/2015, 07/14/2016   Influenza-Unspecified 09/08/2014, 08/10/2017, 08/19/2019, 09/10/2023   Moderna Sars-Covid-2 Vaccination 03/24/2020, 04/20/2020, 10/06/2020   Pneumococcal Conjugate-13 11/12/2020   Pneumococcal Polysaccharide-23 11/07/1999, 09/14/2017, 01/24/2018   RSV,unspecified 10/23/2022   Td 05/03/2005   Tdap 01/18/2017   Zoster Recombinant(Shingrix) 06/24/2021, 09/05/2021  There are no preventive care reminders to display for this patient.    Past Medical History:  Diagnosis Date   Allergic rhinitis    Anemia, pernicious    Anxiety and depression    sees Dr. Vincente   Arthritis    B12 deficiency 01/23/2020   Blood transfusion without reported diagnosis    Catheter-associated urinary tract infection    Diverticulosis of colon    Elevated PSA    and hypogonadism, sees urologist   GERD (gastroesophageal  reflux disease)    hiatal hernia   Hx of blood transfusion reaction 1999   Hyperlipidemia    Hyperparathyroidism    Hyperplastic colon polyp    Hypertension    acei causes cough   IBS (irritable bowel syndrome)    Nephrolithiasis    Obstructive chronic bronchitis without exacerbation, followed by Dr. Brien 03/04/2009   ONO RA  Normal 07/28/11 6 min walk >566m no desat PFTs 08/08/11: DLCO 70% TLC 80%  FeV1 80%   Fef 25 75 56% with significant improvement after BD    Peritonitis (HCC)    after surgery in 1999   Pneumonia    PONV (postoperative nausea and vomiting)    Rectal fissure    Trigeminal neuralgia    has facial asymetry   Ventral hernia    Past Surgical History:  Procedure Laterality Date   ABDOMINAL SURGERY     Multiple   BRAIN SURGERY  01/2011   Trigeminal nerve/Dr Unice   CHOLECYSTECTOMY  1981   DENTAL SURGERY  2019   tooth extraction, root canal   ESOPHAGEAL MANOMETRY N/A 04/28/2020   Procedure: ESOPHAGEAL MANOMETRY (EM);  Surgeon: Shila Gustav GAILS, MD;  Location: [facility];  Service: Endoscopy;  Laterality: N/A;   HAND SURGERY  1973   Left   HERNIA REPAIR  1999   KNEE SURGERY  1997   Left   LITHOTRIPSY     Right  NISSEN FUNDOPLICATION  1999    complicated by gastric perforation, peritonitis, ventral nernia    RHIZOTOMY Right 11/17/2014   Procedure: RHIZOTOMY TO RIGHT V2,V3;  Surgeon: Fairy Levels, MD;  Location: [facility];  Service: Neurosurgery;  Laterality: Right;  right   RHIZOTOMY Right 12/22/2014   Procedure: Right V2 and V3 trigeminal rhysolysis;  Surgeon: Fairy Levels, MD;  Location: [facility];  Service: Neurosurgery;  Laterality: Right;  Right V2 and V3 trigeminal rhysolysis   RHIZOTOMY W/ RADIOFREQUENCY ABLATION  08/2017   Dr Mindi   UPPER GASTROINTESTINAL ENDOSCOPY     VENTRAL HERNIA REPAIR  2004    reports that he quit smoking about 28 years ago. His smoking use included cigarettes. He started smoking about 48 years ago. He has a 20  pack-year smoking history. He quit smokeless tobacco use about 41 years ago.  His smokeless tobacco use included chew. He reports that he does not drink alcohol and does not use drugs. family history includes Colon polyps in his mother; Coronary artery disease in his mother; Diabetes in his brother, mother, and sister; Heart disease in his brother and brother; Hypertension in his brother. Allergies[1] Medications Ordered Prior to Encounter[2]      ROS:  All others reviewed and negative.  Objective        PE:  BP 120/78 (BP Location: Right Arm, Patient Position: Sitting, Cuff Size: Normal)   Pulse 67   Temp 97.9 F (36.6 C) (Oral)   Ht 5' 11 (1.803 m)   Wt 237 lb (107.5 kg)   SpO2 95%   BMI 33.05 kg/m                 Constitutional: Pt appears in NAD               HENT: Head: NCAT.                Right Ear: External ear normal.                 Left Ear: External ear normal.                Eyes: . Pupils are equal, round, and reactive to light. Conjunctivae and EOM are normal               Nose: without d/c or deformity               Neck: Neck supple. Gross normal ROM               Cardiovascular: Normal rate and regular rhythm.                 Pulmonary/Chest: Effort normal and breath sounds without rales or wheezing.                Abd:  Soft, NT, ND, + BS, no organomegaly               Neurological: Pt is alert. At baseline orientation, motor grossly intact               Skin: Skin is warm. No rashes, no other new lesions, LE edema - none               Psychiatric: Pt behavior is normal without agitation   Micro: none  Cardiac tracings I have personally interpreted today:  none  Pertinent Radiological findings (summarize): none   Lab Results  Component Value Date   WBC 7.6  08/12/2024   HGB 15.3 08/12/2024   HCT 45.6 08/12/2024   PLT 183.0 08/12/2024   GLUCOSE 96 08/12/2024   CHOL 170 05/23/2024   TRIG 140.0 05/23/2024   HDL 57.40 05/23/2024   LDLCALC 84 05/23/2024    ALT 9 08/12/2024   AST 9 08/12/2024   NA 135 08/12/2024   K 4.1 08/12/2024   CL 96 08/12/2024   CREATININE 0.87 08/12/2024   BUN 12 08/12/2024   CO2 31 08/12/2024   TSH 1.69 05/23/2024   PSA 2.64 05/23/2024   HGBA1C 5.8 05/23/2024   Assessment/Plan:  Khaleb Broz is a 74 y.o. White or Caucasian [1] male with  has a past medical history of Allergic rhinitis, Anemia, pernicious, Anxiety and depression, Arthritis, B12 deficiency (01/23/2020), Blood transfusion without reported diagnosis, Catheter-associated urinary tract infection, Diverticulosis of colon, Elevated PSA, GERD (gastroesophageal reflux disease), blood transfusion reaction (1999), Hyperlipidemia, Hyperparathyroidism, Hyperplastic colon polyp, Hypertension, IBS (irritable bowel syndrome), Nephrolithiasis, Obstructive chronic bronchitis without exacerbation, followed by Dr. Brien (03/04/2009), Peritonitis (HCC), Pneumonia, PONV (postoperative nausea and vomiting), Rectal fissure, Trigeminal neuralgia, and Ventral hernia.  Encounter for well adult exam with abnormal findings Age and sex appropriate education and counseling updated with regular exercise and diet Referrals for preventative services - none needed Immunizations addressed - declines flu shot Smoking counseling  - none needed Evidence for depression or other mood disorder - none significant Most recent labs reviewed. I have personally reviewed and have noted: 1) the patient's medical and social history 2) The patient's current medications and supplements 3) The patient's height, weight, and BMI have been recorded in the chart   Elevated PSA Lab Results  Component Value Date   PSA 2.64 05/23/2024   PSA 3.35 12/07/2023   PSA 3.21 05/15/2023   Improved, for f/u lab today, to f/u any worsening symptoms or concerns  Essential hypertension BP Readings from Last 3 Encounters:  11/28/24 120/78  09/23/24 (!) 155/95  08/12/24 126/78   Stable, pt to  continue medical treatment norvasc  2.5 mg every day, hyzaar 50 12.5 qd   Hyperglycemia Lab Results  Component Value Date   HGBA1C 5.8 05/23/2024   Stable, pt to continue current medical treatment  - diet, wt control   HLD (hyperlipidemia) Lab Results  Component Value Date   LDLCALC 84 05/23/2024   Mild uncontrolled, pt to continue low chol diet, declines statin for now   Vitamin D  deficiency Last vitamin D  Lab Results  Component Value Date   VD25OH 27.42 (L) 05/23/2024   Low, to start oral replacement   Trigger finger, right ring finger With mild worsening  - for hand surgury referral  Followup: Return in about 6 months (around 05/28/2025).  Lynwood Rush, MD 11/28/2024 1:03 PM [facility]  Primary Care - [facility]     [1]  Allergies Allergen Reactions   Dilantin  [Phenytoin ] Other (See Comments)    bradycardia   Dilaudid  [Hydromorphone  Hcl] Nausea And Vomiting   Morphine  Nausea And Vomiting   Sulfa Antibiotics Hives   Amoxicillin  Nausea Only   Clavulanic Acid Nausea Only   Rosuvastatin      Leg cramps   Ace Inhibitors Cough   Augmentin [Amoxicillin -Pot Clavulanate] Nausea And Vomiting   Codeine Nausea And Vomiting  [2]  Current Outpatient Medications on File Prior to Visit  Medication Sig Dispense Refill   acetaminophen  (TYLENOL ) 325 MG tablet Take 2 tablets (650 mg total) by mouth every 6 (six) hours as needed for mild  pain, moderate pain or fever.     ALPRAZolam  (XANAX ) 1 MG tablet TAKE 1 TABLET BY MOUTH FOUR TIMES DAILY AS NEEDED FOR ANXIETY 120 tablet 2   amLODipine  (NORVASC ) 2.5 MG tablet Take 1 tablet (2.5 mg total) by mouth daily. 90 tablet 3   carbamazepine  (TEGRETOL  XR) 100 MG 12 hr tablet Take 100-200 mg by mouth See admin instructions. 100mg  in the morning and 200mg  at night.     cyanocobalamin  (VITAMIN B12) 1000 MCG/ML injection INJECT 1 ML every 7 days 60 mL 3   esomeprazole  (NEXIUM ) 40 MG capsule TAKE ONE  CAPSULE BY MOUTH TWICE DAILY BEFORE a meal 180 capsule 3   famotidine  (PEPCID ) 20 MG tablet Take 1 tablet (20 mg total) by mouth at bedtime. 90 tablet 3   finasteride  (PROSCAR ) 5 MG tablet Take 1 tablet (5 mg total) by mouth daily. 90 tablet 3   gabapentin  (NEURONTIN ) 400 MG capsule Take 400-800 mg by mouth See admin instructions. Taking 400mg  at 0800, 1400, then 2 capsules (800mg ) at bedtime.     losartan -hydrochlorothiazide  (HYZAAR) 50-12.5 MG tablet Take 1 tablet by mouth daily.     polyethylene glycol powder (GLYCOLAX /MIRALAX ) powder DISSOLVE 1 CAPFUL IN LIQUID EVERY DAY AS NEEDED FOR CONSTIPATION 527 g 2   Probiotic Product (ALIGN) 4 MG CAPS Take 4 mg by mouth daily.     sucralfate  (CARAFATE ) 1 g tablet TAKE 1 TABLET BY MOUTH FOUR TIMES DAILY WITH meals AND AT BEDTIME (DO NOT TAKE WITHIN 4 HOURS OF ANY OTHER MEDICATIONS). 150 tablet 0   tamsulosin  (FLOMAX ) 0.4 MG CAPS capsule Take 0.4 mg by mouth at bedtime. (Patient taking differently: Take 0.4 mg by mouth 2 (two) times daily.)     No current facility-administered medications on file prior to visit.

## 2024-11-28 NOTE — Patient Instructions (Addendum)
 Please continue all other medications as before, and refills have been done if requested.  Please have the pharmacy call with any other refills you may need.  Please continue your efforts at being more active, low cholesterol diet, and weight control.  You are otherwise up to date with prevention measures today.  Please keep your appointments with your specialists as you may have planned  You will be contacted regarding the referral for: Dr Camella - [facility]  Please go to the LAB at the blood drawing area for the tests to be done  You will be contacted by phone if any changes need to be made immediately.  Otherwise, you will receive a letter about your results with an explanation, but please check with MyChart first.  Please make an Appointment to return in 6 months, or sooner if needed

## 2024-11-28 NOTE — Assessment & Plan Note (Signed)
 BP Readings from Last 3 Encounters:  11/28/24 120/78  09/23/24 (!) 155/95  08/12/24 126/78   Stable, pt to continue medical treatment norvasc  2.5 mg every day, hyzaar 50 12.5 qd

## 2024-11-28 NOTE — Assessment & Plan Note (Signed)
Age and sex appropriate education and counseling updated with regular exercise and diet Referrals for preventative services - none needed Immunizations addressed - declines flu shot Smoking counseling  - none needed Evidence for depression or other mood disorder - none significant Most recent labs reviewed. I have personally reviewed and have noted: 1) the patient's medical and social history 2) The patient's current medications and supplements 3) The patient's height, weight, and BMI have been recorded in the chart  

## 2024-11-28 NOTE — Assessment & Plan Note (Addendum)
 Lab Results  Component Value Date   PSA 2.64 05/23/2024   PSA 3.35 12/07/2023   PSA 3.21 05/15/2023   Improved, for f/u lab today, to f/u any worsening symptoms or concerns

## 2024-11-28 NOTE — Assessment & Plan Note (Signed)
 Last vitamin D  Lab Results  Component Value Date   VD25OH 27.42 (L) 05/23/2024   Low, to start oral replacement

## 2024-11-29 NOTE — Progress Notes (Signed)
 The test results show that your current treatment is OK, as the tests are stable.  Please continue the same plan.  There is no other need for change of treatment or further evaluation based on these results, at this time.  thanks

## 2024-12-08 ENCOUNTER — Encounter: Admitting: Student in an Organized Health Care Education/Training Program

## 2024-12-23 ENCOUNTER — Encounter: Admitting: Student in an Organized Health Care Education/Training Program

## 2025-07-10 ENCOUNTER — Ambulatory Visit
